# Patient Record
Sex: Male | Born: 1955
Health system: Southern US, Community
[De-identification: ages and names within clinical notes are randomized; demographics above are authoritative.]

## PROBLEM LIST (undated history)

## (undated) DIAGNOSIS — J449 Chronic obstructive pulmonary disease, unspecified: Secondary | ICD-10-CM

## (undated) DIAGNOSIS — I509 Heart failure, unspecified: Secondary | ICD-10-CM

## (undated) DIAGNOSIS — I25810 Atherosclerosis of coronary artery bypass graft(s) without angina pectoris: Secondary | ICD-10-CM

## (undated) DIAGNOSIS — E785 Hyperlipidemia, unspecified: Secondary | ICD-10-CM

## (undated) DIAGNOSIS — I10 Essential (primary) hypertension: Secondary | ICD-10-CM

## (undated) DIAGNOSIS — E119 Type 2 diabetes mellitus without complications: Secondary | ICD-10-CM

## (undated) DIAGNOSIS — K219 Gastro-esophageal reflux disease without esophagitis: Secondary | ICD-10-CM

## (undated) DIAGNOSIS — R569 Unspecified convulsions: Secondary | ICD-10-CM

## (undated) DIAGNOSIS — I499 Cardiac arrhythmia, unspecified: Secondary | ICD-10-CM

## (undated) DIAGNOSIS — I639 Cerebral infarction, unspecified: Secondary | ICD-10-CM

## (undated) DIAGNOSIS — Z951 Presence of aortocoronary bypass graft: Secondary | ICD-10-CM

## (undated) DIAGNOSIS — I2510 Atherosclerotic heart disease of native coronary artery without angina pectoris: Secondary | ICD-10-CM

## (undated) HISTORY — PX: EYE SURGERY: SHX253

## (undated) HISTORY — PX: CARDIAC SURGERY: SHX584

## (undated) HISTORY — DX: Heart failure, unspecified: I50.9

## (undated) HISTORY — PX: CORNEAL TRANSPLANT: SHX108

## (undated) HISTORY — PX: TRIGGER FINGER RELEASE: SHX641

## (undated) NOTE — *Deleted (*Deleted)
Transition of Care [facility]) - CM/SW Discharge Note   Patient Details  Name: Thomas Moore MRN: [id number] Date of Birth: 01/22/1939  Transition of Care [facility]) CM/SW Contact:  Nance Pear, RN Phone Number: 02/20/2020, 12:49 PM   Clinical Narrative:       Final next level of care: Home w Home Health Services Barriers to Discharge: No Barriers Identified   Patient Goals and CMS Choice Patient states their goals for this hospitalization and ongoing recovery are:: To return home      Discharge Placement                       Discharge Plan and Services In-house Referral: NA Discharge Planning Services: CM Consult Post Acute Care Choice: Home Health                    HH Arranged: RN, PT, OT [facility] Agency: [facility] Care Date [facility] Agency Contacted: 02/20/20 Time HH Agency Contacted: 1132 Representative spoke with at [facility] Agency: Kandee Keen  Social Determinants of Health (SDOH) Interventions     Readmission Risk Interventions Readmission Risk Prevention Plan 02/06/2020 12/29/2019 12/15/2019  Transportation Screening Complete Complete Complete  Medication Review [employer]) Complete Complete Complete  PCP or Specialist appointment within 3-5 days of discharge - Complete -  HRI or Home Care Consult Complete Complete Complete  SW Recovery Care/Counseling Consult Complete Complete Complete  Palliative Care Screening Not Applicable Complete Not Applicable  Skilled Nursing Facility Not Applicable Not Applicable Not Applicable  Some recent data might be hidden

---

## 1995-06-06 HISTORY — PX: CORONARY ARTERY BYPASS GRAFT: SHX141

## 2003-04-22 ENCOUNTER — Other Ambulatory Visit: Payer: Self-pay

## 2003-06-23 ENCOUNTER — Other Ambulatory Visit: Payer: Self-pay

## 2003-06-30 ENCOUNTER — Other Ambulatory Visit: Payer: Self-pay

## 2003-07-15 ENCOUNTER — Other Ambulatory Visit: Payer: Self-pay

## 2003-10-22 ENCOUNTER — Other Ambulatory Visit: Payer: Self-pay

## 2004-03-01 ENCOUNTER — Other Ambulatory Visit: Payer: Self-pay

## 2004-04-27 ENCOUNTER — Emergency Department: Payer: Self-pay | Admitting: Internal Medicine

## 2004-09-05 ENCOUNTER — Ambulatory Visit: Payer: Self-pay | Admitting: Specialist

## 2005-07-19 ENCOUNTER — Emergency Department: Payer: Self-pay | Admitting: Emergency Medicine

## 2005-09-01 ENCOUNTER — Ambulatory Visit: Payer: Self-pay | Admitting: Specialist

## 2007-09-10 ENCOUNTER — Ambulatory Visit: Payer: Self-pay | Admitting: Specialist

## 2009-09-22 ENCOUNTER — Ambulatory Visit: Payer: Self-pay | Admitting: Specialist

## 2010-09-21 ENCOUNTER — Ambulatory Visit: Payer: Self-pay | Admitting: Internal Medicine

## 2010-10-06 ENCOUNTER — Other Ambulatory Visit: Payer: Self-pay | Admitting: Unknown Physician Specialty

## 2010-10-08 ENCOUNTER — Emergency Department: Payer: Self-pay | Admitting: Emergency Medicine

## 2010-10-12 ENCOUNTER — Ambulatory Visit: Payer: Self-pay | Admitting: Specialist

## 2010-12-28 ENCOUNTER — Ambulatory Visit: Payer: Self-pay | Admitting: Internal Medicine

## 2011-01-04 ENCOUNTER — Ambulatory Visit: Payer: Self-pay | Admitting: Internal Medicine

## 2011-01-25 ENCOUNTER — Ambulatory Visit: Payer: Self-pay | Admitting: Unknown Physician Specialty

## 2011-02-04 ENCOUNTER — Ambulatory Visit: Payer: Self-pay | Admitting: Internal Medicine

## 2011-03-06 ENCOUNTER — Ambulatory Visit: Payer: Self-pay | Admitting: Internal Medicine

## 2011-08-25 DIAGNOSIS — Z7901 Long term (current) use of anticoagulants: Secondary | ICD-10-CM | POA: Insufficient documentation

## 2011-10-24 ENCOUNTER — Ambulatory Visit: Payer: Self-pay | Admitting: Internal Medicine

## 2011-11-20 ENCOUNTER — Ambulatory Visit: Payer: Self-pay | Admitting: Internal Medicine

## 2011-11-21 ENCOUNTER — Ambulatory Visit: Payer: Self-pay | Admitting: Internal Medicine

## 2012-01-23 ENCOUNTER — Ambulatory Visit: Payer: Self-pay | Admitting: Unknown Physician Specialty

## 2012-06-12 ENCOUNTER — Ambulatory Visit: Payer: Self-pay | Admitting: Internal Medicine

## 2012-12-16 ENCOUNTER — Ambulatory Visit: Payer: Self-pay | Admitting: Urology

## 2014-05-13 ENCOUNTER — Encounter: Payer: Self-pay | Admitting: Podiatry

## 2014-05-13 ENCOUNTER — Ambulatory Visit (INDEPENDENT_AMBULATORY_CARE_PROVIDER_SITE_OTHER): Payer: 59 | Admitting: Podiatry

## 2014-05-13 ENCOUNTER — Ambulatory Visit (INDEPENDENT_AMBULATORY_CARE_PROVIDER_SITE_OTHER): Payer: 59

## 2014-05-13 VITALS — BP 159/89 | HR 115 | Resp 16 | Ht 63.0 in | Wt 187.0 lb

## 2014-05-13 DIAGNOSIS — M722 Plantar fascial fibromatosis: Secondary | ICD-10-CM

## 2014-05-13 DIAGNOSIS — E119 Type 2 diabetes mellitus without complications: Secondary | ICD-10-CM

## 2014-05-13 NOTE — Progress Notes (Signed)
   Subjective:    Patient ID: Thomas Moore, male    DOB: [birth year], 58 y.o.   MRN: [id number]  HPI Comments: The right foot hurts on the outside, the bottom, and around my toes and my left foot hurts in the same places except for the outside of it. This has been going on for 4 - 5 months. Its getting worse. It hurts when i walk. i wear otc inserts.   Foot Pain      Review of Systems  HENT: Positive for hearing loss.   Cardiovascular:       Calf pain   Musculoskeletal:       Joint pain   Hematological: Bruises/bleeds easily.  All other systems reviewed and are negative.      Objective:   Physical Exam: I have reviewed his past medical history medications out of the surgery social history and review of systems. Pulses are strongly palpable. Neurologic sensorium is intact per Semmes-Weinstein monofilament. Deep tendon reflexes are intact bilateral and muscle strength +5 over 5 dorsiflexion and plantar flexors and inverters emerged on physical musculatures intact. Orthopedic evaluation demonstrates all joints distally to enable full range of motion without crepitus.  Palpation at the fourth fifth met cuboid articulation of the right foot as well as the plantar fascial calcaneal insertion site right. He also has pain on palpation medial calcaneal insertion site of the left heel.        Assessment & Plan:  Assessment: Plantar fasciitis with lateral compensatory syndrome right plantar fasciitis left.  Plan: Injected the dorsal lateral aspect of the right foot. In the left heel left foot. Also placed in a plantar fascial braces and a night splint. Discussed appropriate shoe gear shows exercises and ice therapy.

## 2014-06-10 ENCOUNTER — Ambulatory Visit: Payer: 59 | Admitting: Podiatry

## 2014-06-24 ENCOUNTER — Ambulatory Visit (INDEPENDENT_AMBULATORY_CARE_PROVIDER_SITE_OTHER): Payer: Commercial Managed Care - PPO

## 2014-06-24 ENCOUNTER — Ambulatory Visit (INDEPENDENT_AMBULATORY_CARE_PROVIDER_SITE_OTHER): Payer: Commercial Managed Care - PPO | Admitting: Podiatry

## 2014-06-24 VITALS — BP 122/102 | HR 78 | Resp 16

## 2014-06-24 DIAGNOSIS — M779 Enthesopathy, unspecified: Secondary | ICD-10-CM

## 2014-06-24 DIAGNOSIS — S92911A Unspecified fracture of right toe(s), initial encounter for closed fracture: Secondary | ICD-10-CM

## 2014-06-24 NOTE — Progress Notes (Signed)
He presents today for follow-up of bilateral plantar fasciitis. He states that the heel pain has nearly resolved however over a month ago he dropped a wooden shell on his fifth digit right foot. He states has been hurting horribly and limiting his [place] activities.  Objective: Vital signs are stable he is alert and oriented 3. Pulses are palpable bilateral. Minimal pain on palpation medial calcaneal tubercles bilateral. He does have erythema and edema with ecchymosis fifth digit of the right foot distally considerable swelling overlying the distal phalanx. Radiographic evaluation does demonstrate a crush injury to the distal phalanx with multiple comminution.  Assessment: Crush fracture distal phalanx fifth right. Resolving plantar fasciitis.  Plan: Demonstrated to him how to tape the toe daily. I encouraged him to wear loose fitting shoes and if the pain does not resolve in the next few months, secondary to the amount of from a distal amputation may be necessary.

## 2014-07-15 DIAGNOSIS — M1711 Unilateral primary osteoarthritis, right knee: Secondary | ICD-10-CM | POA: Insufficient documentation

## 2014-07-22 DIAGNOSIS — Z86711 Personal history of pulmonary embolism: Secondary | ICD-10-CM | POA: Insufficient documentation

## 2015-08-04 DIAGNOSIS — E7800 Pure hypercholesterolemia, unspecified: Secondary | ICD-10-CM | POA: Insufficient documentation

## 2016-09-21 ENCOUNTER — Other Ambulatory Visit: Payer: Self-pay | Admitting: Internal Medicine

## 2016-09-21 DIAGNOSIS — G8929 Other chronic pain: Secondary | ICD-10-CM

## 2016-09-21 DIAGNOSIS — M7581 Other shoulder lesions, right shoulder: Secondary | ICD-10-CM

## 2016-09-21 DIAGNOSIS — M25511 Pain in right shoulder: Secondary | ICD-10-CM

## 2016-10-04 ENCOUNTER — Ambulatory Visit: Payer: 59

## 2017-08-08 ENCOUNTER — Emergency Department: Payer: Managed Care, Other (non HMO)

## 2017-08-08 ENCOUNTER — Observation Stay: Payer: Managed Care, Other (non HMO)

## 2017-08-08 ENCOUNTER — Observation Stay
Admission: EM | Admit: 2017-08-08 | Discharge: 2017-08-09 | Disposition: A | Discharge status: Home / Self Care | Payer: Managed Care, Other (non HMO) | Attending: Internal Medicine | Admitting: Internal Medicine

## 2017-08-08 ENCOUNTER — Other Ambulatory Visit: Payer: Self-pay

## 2017-08-08 ENCOUNTER — Encounter: Payer: Self-pay | Admitting: Emergency Medicine

## 2017-08-08 DIAGNOSIS — Z87891 Personal history of nicotine dependence: Secondary | ICD-10-CM | POA: Insufficient documentation

## 2017-08-08 DIAGNOSIS — E785 Hyperlipidemia, unspecified: Secondary | ICD-10-CM | POA: Insufficient documentation

## 2017-08-08 DIAGNOSIS — I7389 Other specified peripheral vascular diseases: Secondary | ICD-10-CM | POA: Diagnosis not present

## 2017-08-08 DIAGNOSIS — Z7982 Long term (current) use of aspirin: Secondary | ICD-10-CM | POA: Diagnosis not present

## 2017-08-08 DIAGNOSIS — I2510 Atherosclerotic heart disease of native coronary artery without angina pectoris: Secondary | ICD-10-CM | POA: Insufficient documentation

## 2017-08-08 DIAGNOSIS — Z8673 Personal history of transient ischemic attack (TIA), and cerebral infarction without residual deficits: Secondary | ICD-10-CM | POA: Insufficient documentation

## 2017-08-08 DIAGNOSIS — E119 Type 2 diabetes mellitus without complications: Secondary | ICD-10-CM | POA: Insufficient documentation

## 2017-08-08 DIAGNOSIS — R410 Disorientation, unspecified: Secondary | ICD-10-CM | POA: Diagnosis present

## 2017-08-08 DIAGNOSIS — Z87442 Personal history of urinary calculi: Secondary | ICD-10-CM | POA: Diagnosis not present

## 2017-08-08 DIAGNOSIS — R4182 Altered mental status, unspecified: Secondary | ICD-10-CM | POA: Diagnosis present

## 2017-08-08 DIAGNOSIS — M25511 Pain in right shoulder: Secondary | ICD-10-CM | POA: Insufficient documentation

## 2017-08-08 DIAGNOSIS — Z86718 Personal history of other venous thrombosis and embolism: Secondary | ICD-10-CM | POA: Diagnosis not present

## 2017-08-08 DIAGNOSIS — G8929 Other chronic pain: Secondary | ICD-10-CM | POA: Diagnosis not present

## 2017-08-08 DIAGNOSIS — Z8249 Family history of ischemic heart disease and other diseases of the circulatory system: Secondary | ICD-10-CM | POA: Diagnosis not present

## 2017-08-08 DIAGNOSIS — G9340 Encephalopathy, unspecified: Principal | ICD-10-CM | POA: Insufficient documentation

## 2017-08-08 DIAGNOSIS — Z801 Family history of malignant neoplasm of trachea, bronchus and lung: Secondary | ICD-10-CM | POA: Insufficient documentation

## 2017-08-08 DIAGNOSIS — M1990 Unspecified osteoarthritis, unspecified site: Secondary | ICD-10-CM | POA: Diagnosis not present

## 2017-08-08 DIAGNOSIS — I6523 Occlusion and stenosis of bilateral carotid arteries: Secondary | ICD-10-CM | POA: Insufficient documentation

## 2017-08-08 DIAGNOSIS — Z951 Presence of aortocoronary bypass graft: Secondary | ICD-10-CM | POA: Insufficient documentation

## 2017-08-08 DIAGNOSIS — I10 Essential (primary) hypertension: Secondary | ICD-10-CM | POA: Diagnosis not present

## 2017-08-08 DIAGNOSIS — Z79899 Other long term (current) drug therapy: Secondary | ICD-10-CM | POA: Diagnosis not present

## 2017-08-08 DIAGNOSIS — Z7984 Long term (current) use of oral hypoglycemic drugs: Secondary | ICD-10-CM | POA: Insufficient documentation

## 2017-08-08 DIAGNOSIS — Z86711 Personal history of pulmonary embolism: Secondary | ICD-10-CM | POA: Insufficient documentation

## 2017-08-08 DIAGNOSIS — Z7901 Long term (current) use of anticoagulants: Secondary | ICD-10-CM | POA: Insufficient documentation

## 2017-08-08 DIAGNOSIS — Z947 Corneal transplant status: Secondary | ICD-10-CM | POA: Diagnosis not present

## 2017-08-08 HISTORY — DX: Essential (primary) hypertension: I10

## 2017-08-08 HISTORY — DX: Presence of aortocoronary bypass graft: Z95.1

## 2017-08-08 HISTORY — DX: Atherosclerotic heart disease of native coronary artery without angina pectoris: I25.10

## 2017-08-08 HISTORY — DX: Type 2 diabetes mellitus without complications: E11.9

## 2017-08-08 LAB — CBC
HCT: 39.9 % — ABNORMAL LOW (ref 40.0–52.0)
Hemoglobin: 13 g/dL (ref 13.0–18.0)
MCH: 28.1 pg (ref 26.0–34.0)
MCHC: 32.7 g/dL (ref 32.0–36.0)
MCV: 86.1 fL (ref 80.0–100.0)
PLATELETS: 193 10*3/uL (ref 150–440)
RBC: 4.63 MIL/uL (ref 4.40–5.90)
RDW: 15.3 % — AB (ref 11.5–14.5)
WBC: 4.8 10*3/uL (ref 3.8–10.6)

## 2017-08-08 LAB — COMPREHENSIVE METABOLIC PANEL
ALT: 52 U/L (ref 17–63)
AST: 56 U/L — AB (ref 15–41)
Albumin: 3.8 g/dL (ref 3.5–5.0)
Alkaline Phosphatase: 88 U/L (ref 38–126)
Anion gap: 16 — ABNORMAL HIGH (ref 5–15)
BUN: 21 mg/dL — AB (ref 6–20)
CHLORIDE: 103 mmol/L (ref 101–111)
CO2: 19 mmol/L — AB (ref 22–32)
CREATININE: 0.93 mg/dL (ref 0.61–1.24)
Calcium: 8.9 mg/dL (ref 8.9–10.3)
Glucose, Bld: 164 mg/dL — ABNORMAL HIGH (ref 65–99)
POTASSIUM: 4.1 mmol/L (ref 3.5–5.1)
SODIUM: 138 mmol/L (ref 135–145)
Total Bilirubin: 0.5 mg/dL (ref 0.3–1.2)
Total Protein: 7.8 g/dL (ref 6.5–8.1)

## 2017-08-08 LAB — URINALYSIS, COMPLETE (UACMP) WITH MICROSCOPIC
Bacteria, UA: NONE SEEN
Bilirubin Urine: NEGATIVE
GLUCOSE, UA: NEGATIVE mg/dL
Hgb urine dipstick: NEGATIVE
Ketones, ur: NEGATIVE mg/dL
Leukocytes, UA: NEGATIVE
NITRITE: NEGATIVE
PH: 5 (ref 5.0–8.0)
Protein, ur: 100 mg/dL — AB
SPECIFIC GRAVITY, URINE: 1.014 (ref 1.005–1.030)
Squamous Epithelial / LPF: NONE SEEN

## 2017-08-08 LAB — GLUCOSE, CAPILLARY
Glucose-Capillary: 134 mg/dL — ABNORMAL HIGH (ref 65–99)
Glucose-Capillary: 111 mg/dL — ABNORMAL HIGH (ref 65–99)

## 2017-08-08 LAB — URINE DRUG SCREEN, QUALITATIVE (ARMC ONLY)
AMPHETAMINES, UR SCREEN: NOT DETECTED
BENZODIAZEPINE, UR SCRN: NOT DETECTED
Barbiturates, Ur Screen: NOT DETECTED
COCAINE METABOLITE, UR ~~LOC~~: NOT DETECTED
Cannabinoid 50 Ng, Ur ~~LOC~~: NOT DETECTED
MDMA (ECSTASY) UR SCREEN: NOT DETECTED
METHADONE SCREEN, URINE: NOT DETECTED
OPIATE, UR SCREEN: NOT DETECTED
PHENCYCLIDINE (PCP) UR S: NOT DETECTED
Tricyclic, Ur Screen: NOT DETECTED

## 2017-08-08 LAB — DIFFERENTIAL
BASOS ABS: 0 10*3/uL (ref 0–0.1)
BASOS PCT: 1 %
Eosinophils Absolute: 0.1 10*3/uL (ref 0–0.7)
Eosinophils Relative: 2 %
Lymphocytes Relative: 23 %
Lymphs Abs: 1.1 10*3/uL (ref 1.0–3.6)
MONOS PCT: 8 %
Monocytes Absolute: 0.4 10*3/uL (ref 0.2–1.0)
NEUTROS ABS: 3.2 10*3/uL (ref 1.4–6.5)
Neutrophils Relative %: 66 %

## 2017-08-08 LAB — PROTIME-INR
INR: 3.4
PROTHROMBIN TIME: 34.1 s — AB (ref 11.4–15.2)

## 2017-08-08 LAB — SALICYLATE LEVEL

## 2017-08-08 LAB — APTT: APTT: 47 s — AB (ref 24–36)

## 2017-08-08 LAB — ETHANOL: Alcohol, Ethyl (B): <10 mg/dL (ref <10)

## 2017-08-08 LAB — ACETAMINOPHEN LEVEL

## 2017-08-08 LAB — TROPONIN I

## 2017-08-08 MED ORDER — ASPIRIN 81 MG PO CHEW
324.0000 mg | CHEWABLE_TABLET | Freq: Once | ORAL | Status: AC
Start: 2017-08-08 — End: 2017-08-08
  Administered 2017-08-08: 324 mg via ORAL
  Filled 2017-08-08: qty 4

## 2017-08-08 MED ORDER — ATORVASTATIN CALCIUM 20 MG PO TABS
40.0000 mg | ORAL_TABLET | Freq: Every day | ORAL | Status: DC
  Administered 2017-08-08: 18:00:00 40 mg via ORAL
  Filled 2017-08-08: qty 2

## 2017-08-08 MED ORDER — METFORMIN HCL ER 500 MG PO TB24
1000.0000 mg | ORAL_TABLET | Freq: Two times a day (BID) | ORAL | Status: DC
  Administered 2017-08-08 – 2017-08-09 (×2): 1000 mg via ORAL
  Filled 2017-08-08 (×3): qty 2

## 2017-08-08 MED ORDER — METOPROLOL SUCCINATE ER 50 MG PO TB24
50.0000 mg | ORAL_TABLET | Freq: Every day | ORAL | Status: DC
  Administered 2017-08-08 – 2017-08-09 (×2): 50 mg via ORAL
  Filled 2017-08-08 (×2): qty 1

## 2017-08-08 MED ORDER — FUROSEMIDE 40 MG PO TABS
80.0000 mg | ORAL_TABLET | Freq: Every day | ORAL | Status: DC
  Administered 2017-08-08 – 2017-08-09 (×2): 80 mg via ORAL
  Filled 2017-08-08 (×2): qty 2

## 2017-08-08 MED ORDER — ASPIRIN EC 81 MG PO TBEC
81.0000 mg | DELAYED_RELEASE_TABLET | Freq: Every day | ORAL | Status: DC
  Administered 2017-08-09: 09:00:00 81 mg via ORAL
  Filled 2017-08-08: qty 1

## 2017-08-08 MED ORDER — STROKE: EARLY STAGES OF RECOVERY BOOK
Freq: Once | Status: AC
  Administered 2017-08-08: 14:00:00

## 2017-08-08 MED ORDER — ACETAMINOPHEN 325 MG PO TABS: 650.0000 mg | ORAL_TABLET | ORAL | Status: DC | PRN

## 2017-08-08 MED ORDER — RAMIPRIL 10 MG PO CAPS
10.0000 mg | ORAL_CAPSULE | Freq: Every day | ORAL | Status: DC
  Administered 2017-08-08 – 2017-08-09 (×2): 10 mg via ORAL
  Filled 2017-08-08 (×2): qty 1

## 2017-08-08 MED ORDER — ENOXAPARIN SODIUM 40 MG/0.4ML ~~LOC~~ SOLN: 30.0000 mg | SUBCUTANEOUS | Status: DC

## 2017-08-08 MED ORDER — ACETAMINOPHEN 160 MG/5ML PO SOLN
650.0000 mg | ORAL | Status: DC | PRN
  Filled 2017-08-08: qty 20.3

## 2017-08-08 MED ORDER — SODIUM CHLORIDE 0.9 % IV SOLN
INTRAVENOUS | Status: DC
  Administered 2017-08-08: 18:00:00 via INTRAVENOUS

## 2017-08-08 MED ORDER — DULAGLUTIDE 0.75 MG/0.5ML ~~LOC~~ SOAJ: 0.7500 mg | SUBCUTANEOUS | Status: DC

## 2017-08-08 MED ORDER — ACETAMINOPHEN 650 MG RE SUPP: 650.0000 mg | RECTAL | Status: DC | PRN

## 2017-08-08 MED ORDER — SODIUM CHLORIDE 0.9 % IV BOLUS (SEPSIS)
1000.0000 mL | Freq: Once | INTRAVENOUS | Status: AC
  Administered 2017-08-08: 1000 mL via INTRAVENOUS

## 2017-08-08 MED ORDER — LINAGLIPTIN 5 MG PO TABS
5.0000 mg | ORAL_TABLET | Freq: Every day | ORAL | Status: DC
  Administered 2017-08-09: 09:00:00 5 mg via ORAL
  Filled 2017-08-08: qty 1

## 2017-08-08 MED ORDER — GLIPIZIDE ER 2.5 MG PO TB24
2.5000 mg | ORAL_TABLET | Freq: Every day | ORAL | Status: DC
  Administered 2017-08-09: 2.5 mg via ORAL
  Filled 2017-08-08: qty 1

## 2017-08-08 MED ORDER — INSULIN ASPART 100 UNIT/ML ~~LOC~~ SOLN
0.0000 [IU] | Freq: Three times a day (TID) | SUBCUTANEOUS | Status: DC
  Administered 2017-08-09: 1 [IU] via SUBCUTANEOUS
  Administered 2017-08-09: 12:00:00 2 [IU] via SUBCUTANEOUS
  Filled 2017-08-08 (×2): qty 1

## 2017-08-08 NOTE — Code Documentation (Signed)
Pt arrives via EMS, pt was eating at a restaurant when per staff pt looked like he was choking, fell to the ground and then became agitated and confused, upon arrival to ED pt appears drowsy and confused, code stroke initiated, pt taken to CT for non-con head CT and then back to room 26, NIHSS 2, small laceration noted to left side of pts tongue with some blood on his mouth, per MAR in the computer pt on coumadin with hx of PE, no acute treatment, report off to [employer]

## 2017-08-08 NOTE — ED Notes (Signed)
Family at bedside. 

## 2017-08-08 NOTE — ED Notes (Signed)
[facility] transporting pt to floor room 128-1C

## 2017-08-08 NOTE — ED Notes (Signed)
COD STROKE Initiated

## 2017-08-08 NOTE — ED Provider Notes (Signed)
[facility] Emergency Department Provider Note  ____________________________________________  Time seen: Approximately 12:08 PM  I have reviewed the triage vital signs and the nursing notes.   HISTORY  Chief Complaint Code Stroke  Level 5 Caveat: Portions of the History and Physical were unable to be obtained due to altered mental status.   HPI Thomas Moore is a 62 y.o. male comes to the ED with acute altered mental status. Patient drove himself to a restaurant where he is seen frequently, seemed to choke on coffee, and then appeared to be completely confused. No convulsive activity noted. No fall or head trauma. No history of any similar episodes. Patient is unable to provide any coherent history. Electronic medical records shows the patient takes Coumadin.     No past medical history on file. Hyperlipidemia CAD Diabetes   There are no active problems to display for this patient.       Prior to Admission medications   Medication Sig Start Date End Date Taking? Authorizing Provider  aspirin EC 81 MG tablet Take 81 mg by mouth.    [provider]  atorvastatin (LIPITOR) 40 MG tablet Take 40 mg by mouth.    [provider]  furosemide (LASIX) 80 MG tablet Take 80 mg by mouth.    [provider]  gabapentin (NEURONTIN) 300 MG capsule Take 600 mg by mouth.    [provider]  glipiZIDE (GLUCOTROL XL) 2.5 MG 24 hr tablet Take 2.5 mg by mouth.    [provider]  metFORMIN (GLUCOPHAGE-XR) 500 MG 24 hr tablet Take 2 tablets by mouth 2 (two) times daily. 06/06/17   [provider]  metoprolol succinate (TOPROL-XL) 50 MG 24 hr tablet Take 1 tablet by mouth daily. 06/06/17   [provider]  Multiple Vitamins tablet Take by mouth.    [provider]  omeprazole (PRILOSEC) 40 MG capsule Take 40 mg by mouth.    [provider]  ramipril (ALTACE) 10 MG capsule Take 10 mg by mouth.     [provider]  sitaGLIPtin (JANUVIA) 50 MG tablet Take 1 tablet by mouth  daily 01/30/14   [provider]  TRULICITY 0.75 MG/0.5ML SOPN Inject 0.75 mg into the skin every 7 (seven) days. 07/27/17   [provider]  warfarin (COUMADIN) 1 MG tablet  07/31/17   [provider]  warfarin (COUMADIN) 6 MG tablet  07/11/17   [provider]     Allergies Patient has no known allergies.   No family history on file.  Social History Social History   Tobacco Use  . Smoking status: Former Smoker    Types: Cigarettes  . Smokeless tobacco: Never Used  Substance Use Topics  . Alcohol use: No    Alcohol/week: 0.0 oz  . Drug use: Not on file    Review of Systems Unable to obtain due to altered mental status ____________________________________________   PHYSICAL EXAM:  VITAL SIGNS: ED Triage Vitals [08/08/17 1028]  Enc Vitals Group     BP 136/83     Pulse Rate (!) 121     Resp 19     Temp 97.6 F (36.4 C)     Temp Source Oral     SpO2 97 %     Weight 180 lb (81.6 kg)     Height 5\' 6"  (1.676 m)     Head Circumference      Peak Flow      Pain Score  Pain Loc      Pain Edu?      Excl. in GC?     Vital signs reviewed, nursing assessments reviewed.   Constitutional:   Alert and oriented to self. Not in distress. Eyes:   No scleral icterus.  EOMI. No nystagmus. No conjunctival pallor. PERRL. ENT   Head:   Normocephalic and atraumatic.   Nose:   No congestion/rhinnorhea.    Mouth/Throat:   MMM, no pharyngeal erythema. No peritonsillar mass.    Neck:   No meningismus. Full ROM. Hematological/Lymphatic/Immunilogical:   No cervical lymphadenopathy. Cardiovascular:   Tachycardia heart rate 120. Symmetric bilateral radial and DP pulses.  No murmurs. Median sternotomy scar, remote and well-healed Respiratory:   Normal respiratory effort without tachypnea/retractions. Breath sounds are clear and equal bilaterally. No  wheezes/rales/rhonchi. Gastrointestinal:   Soft and nontender. Non distended. There is no CVA tenderness.  No rebound, rigidity, or guarding. Genitourinary:   deferred Musculoskeletal:   Normal range of motion in all extremities. No joint effusions.  No lower extremity tenderness.  No edema. Neurologic:   Normal speech, nonsensical language responses..  Motor grossly intact. NIH stroke scale 1 for confusion.  Skin:    Skin is warm, dry and intact. No rash noted.  No petechiae, purpura, or bullae.  ____________________________________________    LABS (pertinent positives/negatives) (all labs ordered are listed, but only abnormal results are displayed) Labs Reviewed  PROTIME-INR - Abnormal; Notable for the following components:      Result Value   Prothrombin Time 34.1 (*)    All other components within normal limits  APTT - Abnormal; Notable for the following components:   aPTT 47 (*)    All other components within normal limits  CBC - Abnormal; Notable for the following components:   HCT 39.9 (*)    RDW 15.3 (*)    All other components within normal limits  COMPREHENSIVE METABOLIC PANEL - Abnormal; Notable for the following components:   CO2 19 (*)    Glucose, Bld 164 (*)    BUN 21 (*)    AST 56 (*)    Anion gap 16 (*)    All other components within normal limits  ACETAMINOPHEN LEVEL - Abnormal; Notable for the following components:   Acetaminophen (Tylenol), Serum <10 (*)    All other components within normal limits  GLUCOSE, CAPILLARY - Abnormal; Notable for the following components:   Glucose-Capillary 134 (*)    All other components within normal limits  DIFFERENTIAL  TROPONIN I  ETHANOL  SALICYLATE LEVEL  URINALYSIS, COMPLETE (UACMP) WITH MICROSCOPIC  URINE DRUG SCREEN, QUALITATIVE ([facility] ONLY)  CBG MONITORING, ED   ____________________________________________   EKG  Interpreted by me Sinus tachycardia rate 110, normal axis and intervals. Normal QRS ST  segments and T waves.  ____________________________________________    RADIOLOGY  Dg Chest Portable 1 View  Result Date: 08/08/2017 CLINICAL DATA:  Chest pain, shortness of breath. EXAM: PORTABLE CHEST 1 VIEW COMPARISON:  None available currently. FINDINGS: Mild cardiomegaly is noted. Status post coronary artery bypass graft. No pneumothorax or pleural effusion is noted. No acute pulmonary disease is noted. Bony thorax is unremarkable. IMPRESSION: No acute cardiopulmonary abnormality seen. Electronically Signed   By: Lupita Raider, M.D.   On: 08/08/2017 10:44   Ct Head Code Stroke Wo Contrast  Result Date: 08/08/2017 CLINICAL DATA:  Code stroke. Altered level of consciousness, unexplained. EXAM: CT HEAD WITHOUT CONTRAST TECHNIQUE: Contiguous axial images were obtained from the base of the  skull through the vertex without intravenous contrast. COMPARISON:  None available FINDINGS: Brain: No evidence of acute infarction, hemorrhage, hydrocephalus, extra-axial collection or mass lesion/mass effect. Moderate remote left frontal infarct anteriorly. This is in the ACA distribution. Chronic small vessel ischemic type change in the cerebral white matter. Vascular: Atherosclerotic calcification.  No hyperdense vessel. Skull: Normal. Negative for fracture or focal lesion. Sinuses/Orbits: No acute finding. Other: These results were called by telephone at the time of interpretation on 08/08/2017 at 10:23 am to Dr. Sharman Cheek , who verbally acknowledged these results. ASPECTS [facility] Stroke Program Early CT Score) Not scored with this symptomatology. IMPRESSION: 1. No acute finding. 2. Remote left frontal infarct in the ACA distribution. Electronically Signed   By: Marnee Spring M.D.   On: 08/08/2017 10:24    ____________________________________________   PROCEDURES Procedures  ____________________________________________    CLINICAL IMPRESSION / ASSESSMENT AND PLAN / ED COURSE  Pertinent labs  & imaging results that were available during my care of the patient were reviewed by me and considered in my medical decision making (see chart for details).   Patient presents with acute altered mental status. Possible stroke versus seizure versus intoxication. Not consistent with sepsis. Check labs CT had chest x-ray.  Clinical Course as of Aug 08 1245  Wed Aug 08, 2017  1023 D/w radiology, NAD on CT head.   [PS]  1040 D/w neuro Dr. Thad Ranger. Recommends hospitalization for further stroke/seizure workup. Not a candidate for TPA or acute intervention at this time.   [PS]    Clinical Course User Index [PS] Sharman Cheek, MD     ____________________________________________   FINAL CLINICAL IMPRESSION(S) / ED DIAGNOSES    Final diagnoses:  Acute encephalopathy     ED Discharge Orders    None      Portions of this note were generated with dragon dictation software. Dictation errors may occur despite best attempts at proofreading.    Sharman Cheek, MD 08/08/17 1247

## 2017-08-08 NOTE — Consult Note (Signed)
Referring Physician: Scotty Court    Chief Complaint: Syncope, confusion  HPI: Thomas Moore is an 62 y.o. male who by reports awakened this morning at baseline and drove to breakfast.  While attempting to eat was noticed to possibly be choking.  Patient was then confused and somewhat agitated.  Unclear if he hit the ground.  EMS was called and patient was brought in for evaluation.     Date last known well: Date: 08/08/2017 Time last known well: Time: 09:00 tPA Given: No: Resolving symptoms  Past medical history: HTN, DM, HLD, PE, Osteoarthritis, HH, congenital coronary artery anomaly, chronic right shoulder pain, h/o kidney stone  Family history: Father deceased with lung cancer.  Mother deceased with CAD and Alzheimer's disease.  Sister deceased with brain tumor  Social History:  reports that he has quit smoking. His smoking use included cigarettes. he has never used smokeless tobacco. He reports that he does not drink alcohol. His drug history is not on file.  Allergies: No Known Allergies  Medications: I have reviewed the patient's current medications. Prior to Admission:  Prior to Admission medications   Medication Sig Start Date End Date Taking? Authorizing Provider  aspirin EC 81 MG tablet Take 81 mg by mouth.    [provider]  atorvastatin (LIPITOR) 40 MG tablet Take 40 mg by mouth.    [provider]  furosemide (LASIX) 80 MG tablet Take 80 mg by mouth.    [provider]  gabapentin (NEURONTIN) 300 MG capsule Take 600 mg by mouth.    [provider]  glipiZIDE (GLUCOTROL XL) 2.5 MG 24 hr tablet Take 2.5 mg by mouth.    [provider]  metFORMIN (GLUCOPHAGE-XR) 500 MG 24 hr tablet Take 2 tablets by mouth 2 (two) times daily. 06/06/17   [provider]  metoprolol succinate (TOPROL-XL) 50 MG 24 hr tablet Take 1 tablet by mouth daily. 06/06/17   [provider]  Multiple Vitamins tablet Take by mouth.    [provider]  omeprazole (PRILOSEC) 40 MG capsule Take 40 mg by mouth.    [provider]  ramipril (ALTACE) 10 MG capsule Take 10 mg by mouth.    [provider]  sitaGLIPtin (JANUVIA) 50 MG tablet Take 1 tablet by mouth  daily 01/30/14   [provider]  TRULICITY 0.75 MG/0.5ML SOPN Inject 0.75 mg into the skin every 7 (seven) days. 07/27/17   [provider]  warfarin (COUMADIN) 1 MG tablet  07/31/17   [provider]  warfarin (COUMADIN) 6 MG tablet  07/11/17   [provider]     ROS: History obtained from the patient  General ROS: negative for - chills, fatigue, fever, night sweats, weight gain or weight loss Psychological ROS: negative for - behavioral disorder, hallucinations, memory difficulties, mood swings or suicidal ideation Ophthalmic ROS: negative for - blurry vision, double vision, eye pain or loss of vision ENT ROS: negative for - epistaxis, nasal discharge, oral lesions, sore throat, tinnitus or vertigo Allergy and Immunology ROS: negative for - hives or itchy/watery eyes Hematological and Lymphatic ROS: negative for - bleeding problems, bruising or swollen lymph nodes Endocrine ROS: negative for - galactorrhea, hair pattern changes, polydipsia/polyuria or temperature intolerance Respiratory ROS: negative for - cough, hemoptysis, shortness of breath or wheezing Cardiovascular ROS: negative for - chest pain, dyspnea on exertion, edema or irregular heartbeat Gastrointestinal ROS: negative for - abdominal pain, diarrhea, hematemesis, nausea/vomiting or stool incontinence Genito-Urinary ROS: negative for -  dysuria, hematuria, incontinence or urinary frequency/urgency Musculoskeletal ROS: shoulder pain Neurological ROS: as noted in HPI Dermatological ROS: negative for rash and skin lesion changes  Physical Examination: Blood pressure (!) 150/90, pulse (!) 103, temperature 98 F (36.7 C), resp. rate (!) 27, height 5\' 6"   (1.676 m), weight 81.6 kg (180 lb), SpO2 95 %.  HEENT-  Normocephalic, no lesions, without obvious abnormality.  Normal external eye and conjunctiva.  Normal TM's bilaterally.  Normal auditory canals and external ears. Normal external nose, mucus membranes and septum.  Normal pharynx.  Tongue laceration. Cardiovascular- S1, S2 normal, pulses palpable throughout   Lungs- chest clear, no wheezing, rales, normal symmetric air entry Abdomen- soft, non-tender; bowel sounds normal; no masses,  no organomegaly Extremities- no edema Lymph-no adenopathy palpable Musculoskeletal-no joint tenderness, deformity or swelling Skin-warm and dry, no hyperpigmentation, vitiligo, or suspicious lesions  Neurological Examination   Mental Status: Alert, to name and place but unable to give age and amnestic of events of the morning.  Speech fluent without evidence of aphasia.  Able to follow 3 step commands but requires extensive reinforcement. Cranial Nerves: II: Discs flat bilaterally; Visual fields grossly normal, pupils equal, round, reactive to light and accommodation III,IV, VI: ptosis not present, extra-ocular motions intact bilaterally V,VII: smile symmetric, facial light touch sensation normal bilaterally VIII: hearing normal bilaterally IX,X: gag reflex present XI: bilateral shoulder shrug XII: midline tongue extension Motor: Right : Upper extremity   5/5    Left:     Upper extremity   5/5  Lower extremity   5/5     Lower extremity   5/5 Tone and bulk:normal tone throughout; no atrophy noted Sensory: Pinprick and light touch intact throughout, bilaterally Deep Tendon Reflexes: 2+ and symmetric throughout Plantars: Right: upgoing   Left: upgoing Cerebellar: Normal finger-to-nose and normal heel-to-shin testing bilaterally Gait: not tested due to safety concerns   Laboratory Studies:  Basic Metabolic Panel: Recent Labs  Lab 08/08/17 1032  NA 138  K 4.1  CL 103  CO2 19*  GLUCOSE 164*   BUN 21*  CREATININE 0.93  CALCIUM 8.9    Liver Function Tests: Recent Labs  Lab 08/08/17 1032  AST 56*  ALT 52  ALKPHOS 88  BILITOT 0.5  PROT 7.8  ALBUMIN 3.8   No results for input(s): LIPASE, AMYLASE in the last 168 hours. No results for input(s): AMMONIA in the last 168 hours.  CBC: Recent Labs  Lab 08/08/17 1032  WBC 4.8  NEUTROABS 3.2  HGB 13.0  HCT 39.9*  MCV 86.1  PLT 193    Cardiac Enzymes: Recent Labs  Lab 08/08/17 1032  TROPONINI <0.03    BNP: Invalid input(s): POCBNP  CBG: Recent Labs  Lab 08/08/17 1021  GLUCAP 134*    Microbiology: No results found for this or any previous visit.  Coagulation Studies: Recent Labs    08/08/17 1032  LABPROT 34.1*  INR 3.40    Urinalysis:  Recent Labs  Lab 08/08/17 1019  COLORURINE YELLOW*  LABSPEC 1.014  PHURINE 5.0  GLUCOSEU NEGATIVE  HGBUR NEGATIVE  BILIRUBINUR NEGATIVE  KETONESUR NEGATIVE  PROTEINUR 100*  NITRITE NEGATIVE  LEUKOCYTESUR NEGATIVE    Lipid Panel: No results found for: CHOL, TRIG, HDL, CHOLHDL, VLDL, LDLCALC  HgbA1C: No results found for: HGBA1C  Urine Drug Screen:      Component Value Date/Time   LABOPIA NONE DETECTED 08/08/2017 1019   COCAINSCRNUR NONE DETECTED 08/08/2017 1019   LABBENZ NONE DETECTED 08/08/2017 1019  AMPHETMU NONE DETECTED 08/08/2017 1019   THCU NONE DETECTED 08/08/2017 1019   LABBARB NONE DETECTED 08/08/2017 1019    Alcohol Level:  Recent Labs  Lab 08/08/17 1032  ETH <10    Other results: EKG: sinus rhythm.  Imaging: Dg Chest Portable 1 View  Result Date: 08/08/2017 CLINICAL DATA:  Chest pain, shortness of breath. EXAM: PORTABLE CHEST 1 VIEW COMPARISON:  None available currently. FINDINGS: Mild cardiomegaly is noted. Status post coronary artery bypass graft. No pneumothorax or pleural effusion is noted. No acute pulmonary disease is noted. Bony thorax is unremarkable. IMPRESSION: No acute cardiopulmonary abnormality seen.  Electronically Signed   By: Lupita Raider, M.D.   On: 08/08/2017 10:44   Ct Head Code Stroke Wo Contrast  Result Date: 08/08/2017 CLINICAL DATA:  Code stroke. Altered level of consciousness, unexplained. EXAM: CT HEAD WITHOUT CONTRAST TECHNIQUE: Contiguous axial images were obtained from the base of the skull through the vertex without intravenous contrast. COMPARISON:  None available FINDINGS: Brain: No evidence of acute infarction, hemorrhage, hydrocephalus, extra-axial collection or mass lesion/mass effect. Moderate remote left frontal infarct anteriorly. This is in the ACA distribution. Chronic small vessel ischemic type change in the cerebral white matter. Vascular: Atherosclerotic calcification.  No hyperdense vessel. Skull: Normal. Negative for fracture or focal lesion. Sinuses/Orbits: No acute finding. Other: These results were called by telephone at the time of interpretation on 08/08/2017 at 10:23 am to Dr. Sharman Cheek , who verbally acknowledged these results. ASPECTS [facility] Stroke Program Early CT Score) Not scored with this symptomatology. IMPRESSION: 1. No acute finding. 2. Remote left frontal infarct in the ACA distribution. Electronically Signed   By: Marnee Spring M.D.   On: 08/08/2017 10:24    Assessment: 62 y.o. male presenting with an episode of confusion that is slowly resolving.  No focality noted on neurological examination.  Head CT reviewed and shows a chronic left frontal infarct.  Patient on ASA and Coumadin.  Unclear if this presentation represents a seizure.  Further work up recommended.    Stroke Risk Factors - diabetes mellitus, hyperlipidemia and hypertension  Plan: 1. HgbA1c, fasting lipid panel 2. MRI, MRA  of the brain without contrast 3. EEG 4. Telemetry monitoring 5. Frequent neuro checks 6. Continue ASA and Coumadin 7. Carotid doppler 8. Seizure precautions   Thana Farr, MD Neurology [phone] 08/08/2017, 12:32 PM

## 2017-08-08 NOTE — H&P (Signed)
[facility] -  at [facility]   PATIENT NAME: Thomas Moore    MR#:  [id number]  DATE OF BIRTH:  April 14, 1956  DATE OF ADMISSION:  08/08/2017  PRIMARY CARE PHYSICIAN: Leotis Shames, MD   REQUESTING/REFERRING PHYSICIAN: Dr. Scotty Court  CHIEF COMPLAINT: Altered mental status   Chief Complaint  Patient presents with  . Code Stroke    HISTORY OF PRESENT ILLNESS:  Thomas Moore  is a 62 y.o. male with a known history of hypertension, diabetes mellitus type 2, CAD, hyperlipidemia, chronic right shoulder pain noted to have started on confusion, choking episode, froth coming out of his mouth.  Patient alert now and he says he went to [facility] this morning after that he went home unchanged his dressing went to restaurant that usually goes.  Patient found to have choking episode, confusion, according to his friend who is at bedside now, was also at [employer].  He told me that he noted to have blood coming out of mouth, found on the floor.  Patient does not remember any of the events and he says he does not even remember going to [employer].  He appears alert, awake, oriented.  Denies any weakness of hands or legs.  Speech is clear.  Has chronic   Right shoulder pain. PAST MEDICAL HISTORY:  No past medical history on file.  PAST SURGICAL HISTOIRY:   CAD, CABG. Corneal transplant in the right eye. History of left knee surgery SOCIAL HISTORY:   Social History   Tobacco Use  . Smoking status: Former Smoker    Types: Cigarettes  . Smokeless tobacco: Never Used  Substance Use Topics  . Alcohol use: No    Alcohol/week: 0.0 oz    FAMILY HISTORY:  Father had lung cancer, mother had Alzheimer's dementia, sister and brother had strokes.  DRUG ALLERGIES:  No Known Allergies  REVIEW OF SYSTEMS:  CONSTITUTIONAL: No fever, fatigue or weakness.  EYES: No blurred or double vision.  EARS, NOSE, AND THROAT: No tinnitus or ear pain.  RESPIRATORY: No cough, shortness  of breath, wheezing or hemoptysis.  CARDIOVASCULAR: No chest pain, orthopnea, edema.  GASTROINTESTINAL: No nausea, vomiting, diarrhea or abdominal pain.  GENITOURINARY: No dysuria, hematuria.  ENDOCRINE: No polyuria, nocturia,  HEMATOLOGY: No anemia, easy bruising or bleeding SKIN: No rash or lesion. MUSCULOSKELETAL: No joint pain or arthritis.   NEUROLOGIC: No tingling, numbness, weakness.  PSYCHIATRY: No anxiety or depression.   MEDICATIONS AT HOME:   Prior to Admission medications   Medication Sig Start Date End Date Taking? Authorizing Provider  aspirin EC 81 MG tablet Take 81 mg by mouth.    [provider]  atorvastatin (LIPITOR) 40 MG tablet Take 40 mg by mouth.    [provider]  furosemide (LASIX) 80 MG tablet Take 80 mg by mouth.    [provider]  gabapentin (NEURONTIN) 300 MG capsule Take 600 mg by mouth.    [provider]  glipiZIDE (GLUCOTROL XL) 2.5 MG 24 hr tablet Take 2.5 mg by mouth.    [provider]  metFORMIN (GLUCOPHAGE-XR) 500 MG 24 hr tablet Take 2 tablets by mouth 2 (two) times daily. 06/06/17   [provider]  metoprolol succinate (TOPROL-XL) 50 MG 24 hr tablet Take 1 tablet by mouth daily. 06/06/17   [provider]  Multiple Vitamins tablet Take by mouth.    [provider]  omeprazole (PRILOSEC) 40 MG capsule Take 40 mg by mouth.    [provider]  ramipril (ALTACE) 10 MG capsule Take 10 mg by mouth.    [provider]  sitaGLIPtin (JANUVIA) 50 MG tablet Take 1 tablet by mouth  daily 01/30/14   [provider]  TRULICITY 0.75 MG/0.5ML SOPN Inject 0.75 mg into the skin every 7 (seven) days. 07/27/17   [provider]  warfarin (COUMADIN) 1 MG tablet  07/31/17   [provider]  warfarin (COUMADIN) 6 MG tablet  07/11/17   [provider]      VITAL SIGNS:  Blood pressure (!) 141/82, pulse 99, temperature 98 F (36.7 C), resp. rate 20,  height 5\' 6"  (1.676 m), weight 81.6 kg (180 lb), SpO2 95 %.  PHYSICAL EXAMINATION:  GENERAL:  62 y.o.-year-old patient lying in the bed with no acute distress.  EYES: Pupils equal, round, reactive to light and accommodation. No scleral icterus. Extraocular muscles intact.  HEENT: Head atraumatic, normocephalic. Oropharynx and nasopharynx clear.  NECK:  Supple, no jugular venous distention. No thyroid enlargement, no tenderness.  LUNGS: Normal breath sounds bilaterally, no wheezing, rales,rhonchi or crepitation. No use of accessory muscles of respiration.  CARDIOVASCULAR: S1, S2 normal. No murmurs, rubs, or gallops.  ABDOMEN: Soft, nontender, nondistended. Bowel sounds present. No organomegaly or mass.  EXTREMITIES: No pedal edema, cyanosis, or clubbing.  NEUROLOGIC: Cranial nerves II through XII are intact. Muscle strength 5/5 in all extremities. Sensation intact. Gait not checked.  PSYCHIATRIC: The patient is alert and oriented x 3.  SKIN: No obvious rash, lesion, or ulcer.   LABORATORY PANEL:   CBC Recent Labs  Lab 08/08/17 1032  WBC 4.8  HGB 13.0  HCT 39.9*  PLT 193   ------------------------------------------------------------------------------------------------------------------  Chemistries  Recent Labs  Lab 08/08/17 1032  NA 138  K 4.1  CL 103  CO2 19*  GLUCOSE 164*  BUN 21*  CREATININE 0.93  CALCIUM 8.9  AST 56*  ALT 52  ALKPHOS 88  BILITOT 0.5   ------------------------------------------------------------------------------------------------------------------  Cardiac Enzymes Recent Labs  Lab 08/08/17 1032  TROPONINI <0.03   ------------------------------------------------------------------------------------------------------------------  RADIOLOGY:  Dg Chest Portable 1 View  Result Date: 08/08/2017 CLINICAL DATA:  Chest pain, shortness of breath. EXAM: PORTABLE CHEST 1 VIEW COMPARISON:  None available currently. FINDINGS: Mild cardiomegaly is noted.  Status post coronary artery bypass graft. No pneumothorax or pleural effusion is noted. No acute pulmonary disease is noted. Bony thorax is unremarkable. IMPRESSION: No acute cardiopulmonary abnormality seen. Electronically Signed   By: Lupita Raider, M.D.   On: 08/08/2017 10:44   Ct Head Code Stroke Wo Contrast  Result Date: 08/08/2017 CLINICAL DATA:  Code stroke. Altered level of consciousness, unexplained. EXAM: CT HEAD WITHOUT CONTRAST TECHNIQUE: Contiguous axial images were obtained from the base of the skull through the vertex without intravenous contrast. COMPARISON:  None available FINDINGS: Brain: No evidence of acute infarction, hemorrhage, hydrocephalus, extra-axial collection or mass lesion/mass effect. Moderate remote left frontal infarct anteriorly. This is in the ACA distribution. Chronic small vessel ischemic type change in the cerebral white matter. Vascular: Atherosclerotic calcification.  No hyperdense vessel. Skull: Normal. Negative for fracture or focal lesion. Sinuses/Orbits: No acute finding. Other: These results were called by telephone at the time of interpretation on 08/08/2017 at 10:23 am to Dr. Sharman Cheek , who verbally acknowledged these results. ASPECTS [facility] Stroke Program Early CT Score) Not scored with this symptomatology. IMPRESSION: 1. No acute finding. 2. Remote left frontal infarct in the ACA distribution. Electronically Signed   By: Marnee Spring  M.D.   On: 08/08/2017 10:24    EKG:   Orders placed or performed during the hospital encounter of 08/08/17  . ED EKG  . ED EKG  . EKG 12-Lead  . EKG 12-Lead  Sinus tachycardia with 110 bpm, no ST-T changes.  IMPRESSION AND PLAN:   62 year old male patient with multiple medical problems of hypertension, diabetes mellitus type 2, coronary artery disease status post CABG, PE, on Coumadin gotten by EMS for syncope, possible seizure with frothing coming out of mouth, agitated and confused after the event.  His  friend who is visiting him now told me that he was on the floor #1. episode of confusion, syncope, possible seizure: Patient admitted to stroke unit, he has risk factors of diabetes, hypertension, hyperlipidemia, CAD.  Check MRA, MRI of the brain without contrast, EEG, monitor on off unit telemetry, Dr. Thana Farr from neurology.  Patient already on aspirin, Coumadin.  Patient will be monitored overnight, continue seizure precautions.  Patient workup did not show any abnormality, chest x-ray did not show pneumonia, UTI is also clear, normal kidney function.  Troponins all also negative without any EKG changes.  She will we will keep him overnight and get workup for possible stroke versus seizure. Continue  aspirin, statins 2.  Diabetes mellitus type 2; the patient takes Glucotrol XL, metformin.  Check stroke swallow screen, if started on diet restart the medicines.  Continue sliding scale insulin with coverage at this time.  3.  History of PE: Patient takes Coumadin 7 mg at night every day, INR is 3.4.  Pharmacy to dose anticoagulation.  All the records are reviewed and case discussed with ED provider. Management plans discussed with the patient, family and they are in agreement.  CODE STATUS:full  TOTAL TIME TAKING CARE OF THIS PATIENT: 55 minutes.    Katha Hamming M.D on 08/08/2017 at 1:22 PM  Between 7am to 6pm - Pager - [phone]  After 6pm go to [email or web address] - password [email or web address]  [facility]  Office  [phone]  CC: Primary care physician; Leotis Shames, MD  Note: This dictation was prepared with Dragon dictation along with smaller phrase technology. Any transcriptional errors that result from this process are unintentional.

## 2017-08-08 NOTE — Consult Note (Signed)
ANTICOAGULATION CONSULT NOTE - Initial Consult  Pharmacy Consult for Warfarin Dosing  Indication: pulmonary embolus and DVT  No Known Allergies  Patient Measurements: Height: 5\' 6"  (167.6 cm) Weight: 180 lb (81.6 kg) IBW/kg (Calculated) : 63.8  Vital Signs: Temp: 98.3 F (36.8 C) (03/06 1359) Temp Source: Oral (03/06 1359) BP: 154/84 (03/06 1359) Pulse Rate: 99 (03/06 1359)  Labs: Recent Labs    08/08/17 1032  HGB 13.0  HCT 39.9*  PLT 193  APTT 47*  LABPROT 34.1*  INR 3.40  CREATININE 0.93  TROPONINI <0.03    Estimated Creatinine Clearance: 82.6 mL/min (by C-G formula based on SCr of 0.93 mg/dL).  Assessment: Pharmacy consulted for warfarin dosing and monitoring in 62 yo male with PMH of DVT and PE. According to Care Everywhere patient takes warfarin 6.5 mg daily at home. INR supratherapeutic on admission at 3.40  DATE INR DOSE 3/6 3.40 Held   Goal of Therapy:  INR 2-3 Monitor platelets by anticoagulation protocol: Yes   Plan:  Will hold warfarin tonight due to supratherapeutic INR.  Plan to restart warfarin when INR closer to therapeutic range.  INR ordered with AM labs.   Gardner Candle, PharmD, BCPS Clinical Pharmacist 08/08/2017 2:05 PM

## 2017-08-09 ENCOUNTER — Observation Stay
Admit: 2017-08-09 | Discharge: 2017-08-09 | Disposition: A | Discharge status: Home / Self Care | Payer: Managed Care, Other (non HMO) | Attending: Internal Medicine | Admitting: Internal Medicine

## 2017-08-09 ENCOUNTER — Observation Stay: Payer: Managed Care, Other (non HMO)

## 2017-08-09 DIAGNOSIS — R410 Disorientation, unspecified: Secondary | ICD-10-CM

## 2017-08-09 LAB — LIPID PANEL
CHOL/HDL RATIO: 4.4 ratio
Cholesterol: 155 mg/dL (ref 0–200)
HDL: 35 mg/dL — ABNORMAL LOW (ref >40)
LDL CALC: 97 mg/dL (ref 0–99)
TRIGLYCERIDES: 117 mg/dL (ref <150)
VLDL: 23 mg/dL (ref 0–40)

## 2017-08-09 LAB — ECHOCARDIOGRAM COMPLETE
HEIGHTINCHES: 66 in
Weight: 2880 oz

## 2017-08-09 LAB — HEMOGLOBIN A1C
Hgb A1c MFr Bld: 6.5 % — ABNORMAL HIGH (ref 4.8–5.6)
Mean Plasma Glucose: 139.85 mg/dL

## 2017-08-09 LAB — GLUCOSE, CAPILLARY
GLUCOSE-CAPILLARY: 129 mg/dL — AB (ref 65–99)
Glucose-Capillary: 115 mg/dL — ABNORMAL HIGH (ref 65–99)
Glucose-Capillary: 160 mg/dL — ABNORMAL HIGH (ref 65–99)

## 2017-08-09 LAB — PROTIME-INR
INR: 2.79
Prothrombin Time: 29.2 seconds — ABNORMAL HIGH (ref 11.4–15.2)

## 2017-08-09 MED ORDER — WARFARIN SODIUM 6 MG PO TABS
6.0000 mg | ORAL_TABLET | Freq: Every day | ORAL | Status: DC
  Filled 2017-08-09: qty 1

## 2017-08-09 MED ORDER — WARFARIN - PHARMACIST DOSING INPATIENT: Freq: Every day | Status: DC

## 2017-08-09 NOTE — Progress Notes (Signed)
SLP Cancellation Note  Patient Details Name: Thomas Moore MRN: [id number] DOB: 1955/11/09   Cancelled treatment:       Reason Eval/Treat Not Completed: SLP screened, no needs identified, will sign off(chart reviewed; consulted NSG then met w/ pt/friends). Pt denied any difficulty swallowing and is currently on a regular diet; tolerates swallowing pills w/ water per NSG. Pt conversed at conversational level w/out cognitive-linguisitc deficits noted. Pt does appear to have some decreased articulation of speech (<MILD) - unsure if related to pt possibly biting his tongue(blood in his mouth at admission), tight frenulum. Pt and friends present in room whom pt spends much time with denied any new speech-articulation deficits.  No further skilled ST services indicated as pt stated he is at his baseline. Pt agreed. NSG to reconsult if any change in status.    Orinda Kenner, MS, CCC-SLP Watson,Katherine 08/09/2017, 10:56 AM

## 2017-08-09 NOTE — Discharge Instructions (Signed)

## 2017-08-09 NOTE — Evaluation (Signed)
Occupational Therapy Evaluation Patient Details Name: Thomas Moore MRN: [id number] DOB: 07-02-1955 Today's Date: 08/09/2017    History of Present Illness Pt. is a 62 y.o. male who was admitted to [facility] follwoing a choking choking episode at a local restuarant. Pt. PMHx includes: HTN, DM, Type II DM, Hyperlipdidemia, corneal transplant in the right eye, and history of left knee surgery.   Clinical Impression   Pt. Presents with pain in his Right shoulder from a previous RTC injury. Pt. resides at home alone in a second storey apartment with 16 steps to enter. Pt. Was independent with ADLs, IADLs, driving, and working in dining services at [employer]. Pt. Reports being at his baseline with self-care functioning. No further OT services are indicated at this time. Pt. Plans to return home upon discharge. Will complete the order at this time.    Follow Up Recommendations  No OT follow up    Equipment Recommendations       Recommendations for Other Services       Precautions / Restrictions Precautions Precaution Comments: seizure precautions Restrictions Weight Bearing Restrictions: No      Mobility Bed Mobility Overal bed mobility: Independent                Transfers Overall transfer level: Independent                    Balance                                           ADL either performed or assessed with clinical judgement   ADL Overall ADL's : Needs assistance/impaired Eating/Feeding: Independent   Grooming: Independent   Upper Body Bathing: Independent   Lower Body Bathing: Independent;Set up   Upper Body Dressing : Independent;Set up   Lower Body Dressing: Set up;Independent               Functional mobility during ADLs: Independent       Vision Baseline Vision/History: Wears glasses Wears Glasses: At all times Patient Visual Report: No change from baseline       Perception     Praxis      Pertinent  Vitals/Pain Pain Assessment: Faces Faces Pain Scale: Hurts even more Pain Location: right shoulder with ROM Pain Descriptors / Indicators: Aching Pain Intervention(s): Limited activity within patient's tolerance     Hand Dominance Right   Extremity/Trunk Assessment Upper Extremity Assessment Upper Extremity Assessment: RUE deficits/detail RUE Deficits / Details: Limited RUE ROM from a previous RTC injury.           Communication Communication Communication: No difficulties   Cognition Arousal/Alertness: Awake/alert Behavior During Therapy: WFL for tasks assessed/performed Overall Cognitive Status: Within Functional Limits for tasks assessed                                     General Comments       Exercises     Shoulder Instructions      Home Living Family/patient expects to be discharged to:: Private residence Living Arrangements: Alone           Home Layout: One level(Second floor apartment)     Bathroom Shower/Tub: Tub/shower unit;Curtain         Home Equipment: None  Prior Functioning/Environment Level of Independence: Independent        Comments: Independent with ADLs, IADLs, 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        OT Problem List:        OT Treatment/Interventions:      OT Goals(Current goals can be found in the care plan section) Acute Rehab OT Goals Patient Stated Goal: UE strength OT Goal Formulation: With patient Potential to Achieve Goals: Good  OT Frequency:     Barriers to D/C:            Co-evaluation              AM-PAC PT "6 Clicks" Daily Activity     Outcome Measure Help from another person eating meals?: None Help from another person taking care of personal grooming?: None Help from another person toileting, which includes using toliet, bedpan, or urinal?: None Help from another person bathing (including washing, rinsing, drying)?: None Help from another person to put on  and taking off regular upper body clothing?: None Help from another person to put on and taking off regular lower body clothing?: None 6 Click Score: 24   End of Session Equipment Utilized During Treatment: Gait belt  Activity Tolerance: Patient tolerated treatment well Patient left: in bed                   Time: 0946-1005 OT Time Calculation (min): 19 min Charges:  OT General Charges $OT Visit: 1 Visit OT Evaluation $OT Eval Low Complexity: 1 Low G-Codes:     Olegario Messier, MS, OTR/L   Olegario Messier, MS, OTR/L 08/09/2017, 10:45 AM

## 2017-08-09 NOTE — Progress Notes (Signed)
Subjective: Patient reports that he is back to baseline.  Remains amnestic of many of the events of yesterday.    Objective: Current vital signs: BP 122/66 (BP Location: Left Arm)   Pulse 75   Temp (!) 97.5 F (36.4 C) (Oral)   Resp 18   Ht 5\' 6"  (1.676 m)   Wt 81.6 kg (180 lb)   SpO2 99%   BMI 29.05 kg/m  Vital signs in last 24 hours: Temp:  [97.5 F (36.4 C)-98.9 F (37.2 C)] 97.5 F (36.4 C) (03/07 0445) Pulse Rate:  [75-110] 75 (03/07 0445) Resp:  [12-20] 18 (03/07 0445) BP: (112-154)/(61-84) 122/66 (03/07 0445) SpO2:  [94 %-99 %] 99 % (03/07 0445)  Intake/Output from previous day: 03/06 0701 - 03/07 0700 In: 795 [P.O.:240; I.V.:555] Out: 2500 [Urine:2500] Intake/Output this shift: Total I/O In: 360 [P.O.:360] Out: -  Nutritional status: Fall precautions Diet heart healthy/carb modified Room service appropriate? Yes; Fluid consistency: Thin  Neurologic Exam: Mental Status: Alert and oriented.  Speech fluent without evidence of aphasia.  Able to follow 3 step commands but requires extensive reinforcement. Cranial Nerves: II: Discs flat bilaterally; Visual fields grossly normal, pupils equal, round, reactive to light and accommodation III,IV, VI: ptosis not present, extra-ocular motions intact bilaterally V,VII: smile symmetric, facial light touch sensation normal bilaterally VIII: hearing normal bilaterally IX,X: gag reflex present XI: bilateral shoulder shrug XII: midline tongue extension Motor: 5/5 throughout Sensory: Pinprick and light touch intact throughout, bilaterally  Lab Results: Basic Metabolic Panel: Recent Labs  Lab 08/08/17 1032  NA 138  K 4.1  CL 103  CO2 19*  GLUCOSE 164*  BUN 21*  CREATININE 0.93  CALCIUM 8.9    Liver Function Tests: Recent Labs  Lab 08/08/17 1032  AST 56*  ALT 52  ALKPHOS 88  BILITOT 0.5  PROT 7.8  ALBUMIN 3.8   No results for input(s): LIPASE, AMYLASE in the last 168 hours. No results for input(s):  AMMONIA in the last 168 hours.  CBC: Recent Labs  Lab 08/08/17 1032  WBC 4.8  NEUTROABS 3.2  HGB 13.0  HCT 39.9*  MCV 86.1  PLT 193    Cardiac Enzymes: Recent Labs  Lab 08/08/17 1032  TROPONINI <0.03    Lipid Panel: Recent Labs  Lab 08/09/17 0452  CHOL 155  TRIG 117  HDL 35*  CHOLHDL 4.4  VLDL 23  LDLCALC 97    CBG: Recent Labs  Lab 08/08/17 1021 08/08/17 1757 08/08/17 2134 08/09/17 0750 08/09/17 1134  GLUCAP 134* 111* 115* 129* 160*    Microbiology: No results found for this or any previous visit.  Coagulation Studies: Recent Labs    08/08/17 1032 08/09/17 0452  LABPROT 34.1* 29.2*  INR 3.40 2.79    Imaging: Mr Brain Wo Contrast  Result Date: 08/08/2017 CLINICAL DATA:  Altered mental status EXAM: MRI HEAD WITHOUT CONTRAST MRA HEAD WITHOUT CONTRAST TECHNIQUE: Multiplanar, multiecho pulse sequences of the brain and surrounding structures were obtained without intravenous contrast. Angiographic images of the head were obtained using MRA technique without contrast. COMPARISON:  CT 08/08/2017. FINDINGS: MRI HEAD FINDINGS Brain: The midline structures are normal. No focal diffusion restriction to indicate acute infarct. No intraparenchymal hemorrhage. Old left frontal lobe infarct. Multifocal periventricular white matter hyperintensity. No mass lesion. No chronic microhemorrhage or cerebral amyloid angiopathy. No hydrocephalus, age advanced atrophy or lobar predominant volume loss. No dural abnormality or extra-axial collection. Skull and upper cervical spine: The visualized skull base, calvarium, upper cervical spine and  extracranial soft tissues are normal. Sinuses/Orbits: No fluid levels or advanced mucosal thickening. No mastoid effusion. Normal orbits. MRA HEAD FINDINGS Intracranial internal carotid arteries: Atherosclerotic irregularity of both distal carotid siphons. Anterior cerebral arteries: Normal variant absent left A1 segment. Middle cerebral  arteries: Normal. Posterior communicating arteries: Present bilaterally. Posterior cerebral arteries: Normal. Basilar artery: Normal. Vertebral arteries: Codominant.  Normal. Superior cerebellar arteries: Normal. Anterior inferior cerebellar arteries: Normal. Posterior inferior cerebellar arteries: Normal. IMPRESSION: 1. No acute intracranial abnormality or emergent large vessel occlusion. 2. Chronic ischemic microangiopathy and old left frontal lobe infarct. 3. Bilateral atherosclerotic irregularity of the distal carotid siphons. Electronically Signed   By: Deatra Robinson M.D.   On: 08/08/2017 17:57   US Carotid Bilateral (at [facility] Only)  Result Date: 08/08/2017 CLINICAL DATA:  62 year old male with confusion EXAM: BILATERAL CAROTID DUPLEX ULTRASOUND TECHNIQUE: Wallace Cullens scale imaging, color Doppler and duplex ultrasound were performed of bilateral carotid and vertebral arteries in the neck. COMPARISON:  Remote carotid duplex ultrasound 07/01/2003 FINDINGS: Criteria: Quantification of carotid stenosis is based on velocity parameters that correlate the residual internal carotid diameter with NASCET-based stenosis levels, using the diameter of the distal internal carotid lumen as the denominator for stenosis measurement. The following velocity measurements were obtained: RIGHT ICA:  90/26 cm/sec CCA:  77/18 cm/sec SYSTOLIC ICA/CCA RATIO:  1.2 DIASTOLIC ICA/CCA RATIO:  1.4 ECA:  72 cm/sec LEFT ICA:  79/26 cm/sec CCA:  60/12 cm/sec SYSTOLIC ICA/CCA RATIO:  1.3 DIASTOLIC ICA/CCA RATIO:  2.2 ECA:  86 cm/sec RIGHT CAROTID ARTERY: Heterogeneous calcified atherosclerotic plaque in the proximal internal carotid artery. By peak systolic velocity criteria, the estimated stenosis remains less than 50%. RIGHT VERTEBRAL ARTERY:  Patent with normal antegrade flow. LEFT CAROTID ARTERY: Heterogeneous atherosclerotic plaque in the proximal internal carotid artery. By peak systolic velocity criteria, the estimated stenosis remains  less than 50%. LEFT VERTEBRAL ARTERY:  Patent with normal antegrade flow. IMPRESSION: 1. Mild (1-49%) stenosis proximal right internal carotid artery secondary to heterogenous atherosclerotic plaque. 2. Mild (1-49%) stenosis proximal left internal carotid artery secondary to heterogenous atherosclerotic plaque. 3. Vertebral arteries are patent with normal antegrade flow. 4. Irregular heart rate. Does the patient have a known history of atrial fibrillation? Signed, Sterling Big, MD [facility] Radiology Electronically Signed   By: Malachy Moan M.D.   On: 08/08/2017 17:32   Dg Chest Portable 1 View  Result Date: 08/08/2017 CLINICAL DATA:  Chest pain, shortness of breath. EXAM: PORTABLE CHEST 1 VIEW COMPARISON:  None available currently. FINDINGS: Mild cardiomegaly is noted. Status post coronary artery bypass graft. No pneumothorax or pleural effusion is noted. No acute pulmonary disease is noted. Bony thorax is unremarkable. IMPRESSION: No acute cardiopulmonary abnormality seen. Electronically Signed   By: Lupita Raider, M.D.   On: 08/08/2017 10:44   Mr Maxine Glenn Head/brain [facility] Cm  Result Date: 08/08/2017 CLINICAL DATA:  Altered mental status EXAM: MRI HEAD WITHOUT CONTRAST MRA HEAD WITHOUT CONTRAST TECHNIQUE: Multiplanar, multiecho pulse sequences of the brain and surrounding structures were obtained without intravenous contrast. Angiographic images of the head were obtained using MRA technique without contrast. COMPARISON:  CT 08/08/2017. FINDINGS: MRI HEAD FINDINGS Brain: The midline structures are normal. No focal diffusion restriction to indicate acute infarct. No intraparenchymal hemorrhage. Old left frontal lobe infarct. Multifocal periventricular white matter hyperintensity. No mass lesion. No chronic microhemorrhage or cerebral amyloid angiopathy. No hydrocephalus, age advanced atrophy or lobar predominant volume loss. No dural abnormality or  extra-axial collection. Skull and upper cervical spine: The visualized skull base, calvarium, upper cervical spine and extracranial soft tissues are normal. Sinuses/Orbits: No fluid levels or advanced mucosal thickening. No mastoid effusion. Normal orbits. MRA HEAD FINDINGS Intracranial internal carotid arteries: Atherosclerotic irregularity of both distal carotid siphons. Anterior cerebral arteries: Normal variant absent left A1 segment. Middle cerebral arteries: Normal. Posterior communicating arteries: Present bilaterally. Posterior cerebral arteries: Normal. Basilar artery: Normal. Vertebral arteries: Codominant.  Normal. Superior cerebellar arteries: Normal. Anterior inferior cerebellar arteries: Normal. Posterior inferior cerebellar arteries: Normal. IMPRESSION: 1. No acute intracranial abnormality or emergent large vessel occlusion. 2. Chronic ischemic microangiopathy and old left frontal lobe infarct. 3. Bilateral atherosclerotic irregularity of the distal carotid siphons. Electronically Signed   By: Deatra Robinson M.D.   On: 08/08/2017 17:57   Ct Head Code Stroke Wo Contrast  Result Date: 08/08/2017 CLINICAL DATA:  Code stroke. Altered level of consciousness, unexplained. EXAM: CT HEAD WITHOUT CONTRAST TECHNIQUE: Contiguous axial images were obtained from the base of the skull through the vertex without intravenous contrast. COMPARISON:  None available FINDINGS: Brain: No evidence of acute infarction, hemorrhage, hydrocephalus, extra-axial collection or mass lesion/mass effect. Moderate remote left frontal infarct anteriorly. This is in the ACA distribution. Chronic small vessel ischemic type change in the cerebral white matter. Vascular: Atherosclerotic calcification.  No hyperdense vessel. Skull: Normal. Negative for fracture or focal lesion. Sinuses/Orbits: No acute finding. Other: These results were called by telephone at the time of interpretation on 08/08/2017 at 10:23 am to Dr. Sharman Cheek , who  verbally acknowledged these results. ASPECTS [facility] Stroke Program Early CT Score) Not scored with this symptomatology. IMPRESSION: 1. No acute finding. 2. Remote left frontal infarct in the ACA distribution. Electronically Signed   By: Marnee Spring M.D.   On: 08/08/2017 10:24    Medications:  I have reviewed the patient's current medications. Scheduled: . aspirin EC  81 mg Oral Daily  . atorvastatin  40 mg Oral q1800  . furosemide  80 mg Oral Daily  . glipiZIDE  2.5 mg Oral Q breakfast  . insulin aspart  0-9 Units Subcutaneous TID WC  . linagliptin  5 mg Oral Daily  . metFORMIN  1,000 mg Oral BID WC  . metoprolol succinate  50 mg Oral Daily  . ramipril  10 mg Oral Daily  . warfarin  6 mg Oral q1800  . Warfarin - Pharmacist Dosing Inpatient   Does not apply q1800    Assessment/Plan: Patient at baseline.  No further events noted.   Unclear etiology.  MRI of the brain reviewed and shows no acute changes.  Patient supratherapeutic with Coumadin.  Carotid dopplers show no evidence of hemodynamically significant stenosis.  Echocardiogram pending.  A1c 6.5, LDL 97. Seizure remains on the differential.  Recommendations: 1. EEG pending.  Unless evidence of epileptiform discharges on EEG antiepileptic therapy not indicated at this time.  Patient to follow up with neurology on an outpatient basis.     LOS: 0 days   Thana Farr, MD Neurology [phone] 08/09/2017  12:41 PM

## 2017-08-09 NOTE — Progress Notes (Signed)
*  PRELIMINARY RESULTS* Echocardiogram 2D Echocardiogram has been performed.  Cristela Blue 08/09/2017, 9:36 AM

## 2017-08-09 NOTE — Evaluation (Signed)
Physical Therapy Evaluation Patient Details Name: Thomas Moore MRN: [id number] DOB: 06-18-1955 Today's Date: 08/09/2017   History of Present Illness  Pt. is a 62 y.o. male who was admitted to [facility] follwoing a choking episode at a local restuarant. MRI of the brain showed no acute intracranial abnormality. Pt. PMHx includes: HTN, DM, Type II DM, Hyperlipdidemia, corneal transplant in the right eye, and history of left knee surgery.    Clinical Impression  Pt admitted with above presentation. Pt is independent with all aspects of mobility at baseline.  He continues to demonstrate independence with bed mobility, transfers, and ambulation.  No signs of instability with higher level balance activities while ambulating.  No skilled PT needs identified, PT will sign off. Educated pt in the signs and symptoms of a stroke and to call 911 should he experience any of these.  Pt verbalized understanding. Pt reports family h/o stroke.     Follow Up Recommendations No PT follow up    Equipment Recommendations  None recommended by PT    Recommendations for Other Services       Precautions / Restrictions Precautions Precautions: Other (comment) Precaution Comments: seizure precautions Restrictions Weight Bearing Restrictions: No      Mobility  Bed Mobility Overal bed mobility: Independent             General bed mobility comments: No physical assist or cues needed, pt performs independently.   Transfers Overall transfer level: Independent Equipment used: None             General transfer comment: No physical assist or cues needed, pt performs independently.   Ambulation/Gait Ambulation/Gait assistance: Independent Ambulation Distance (Feet): 240 Feet Assistive device: None Gait Pattern/deviations: WFL(Within Functional Limits)   Gait velocity interpretation: at or above normal speed for age/gender General Gait Details: No gait abnormalities appreciated.  Pt steady and no  signs of instability with higher level balance activities.   Stairs            Wheelchair Mobility    Modified Rankin (Stroke Patients Only)       Balance Overall balance assessment: Independent                               Standardized Balance Assessment Standardized Balance Assessment : Dynamic Gait Index   Dynamic Gait Index Level Surface: Normal Change in Gait Speed: Normal Gait with Horizontal Head Turns: Normal Gait with Vertical Head Turns: Normal Gait and Pivot Turn: Normal Step Over Obstacle: Normal Step Around Obstacles: Normal       Pertinent Vitals/Pain Pain Assessment: No/denies pain(only has pain with active motion of R shoulder) Pain Intervention(s): Limited activity within patient's tolerance;Monitored during session    Home Living Family/patient expects to be discharged to:: Private residence Living Arrangements: Alone   Type of Home: Apartment Home Access: Stairs to enter Entrance Stairs-Rails: Lawyer of Steps: flight Home Layout: One level Home Equipment: None      Prior Function Level of Independence: Independent         Comments: Independent with ADLs, IADLs, 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  Pt reports 1 fall in the past 6 months when steeping off the sidewalk.  Pt ambulates without AD.      Hand Dominance   Dominant Hand: Right    Extremity/Trunk Assessment   Upper Extremity Assessment Upper Extremity Assessment: Defer to OT evaluation RUE Deficits / Details: Limited RUE  ROM from a previous RTC injury.    Lower Extremity Assessment Lower Extremity Assessment: Overall WFL for tasks assessed(strength, coordination, sensation all WNL)       Communication   Communication: Other (comment)(slight lisp )  Cognition Arousal/Alertness: Awake/alert Behavior During Therapy: WFL for tasks assessed/performed Overall Cognitive Status: Within Functional Limits for tasks assessed                                         General Comments General comments (skin integrity, edema, etc.): Educated pt in the signs and symptoms of a stroke and to call 911 should he experience any of these.  Pt verbalized understanding.     Exercises     Assessment/Plan    PT Assessment Patent does not need any further PT services  PT Problem List         PT Treatment Interventions      PT Goals (Current goals can be found in the Care Plan section)  Acute Rehab PT Goals Patient Stated Goal: to go home today PT Goal Formulation: All assessment and education complete, DC therapy    Frequency     Barriers to discharge        Co-evaluation               AM-PAC PT "6 Clicks" Daily Activity  Outcome Measure Difficulty turning over in bed (including adjusting bedclothes, sheets and blankets)?: None Difficulty moving from lying on back to sitting on the side of the bed? : None Difficulty sitting down on and standing up from a chair with arms (e.g., wheelchair, bedside commode, etc,.)?: None Help needed moving to and from a bed to chair (including a wheelchair)?: None Help needed walking in hospital room?: None Help needed climbing 3-5 steps with a railing? : None 6 Click Score: 24    End of Session Equipment Utilized During Treatment: Gait belt Activity Tolerance: Patient tolerated treatment well Patient left: in bed;with call bell/phone within reach;with family/visitor present Nurse Communication: Mobility status PT Visit Diagnosis: Unsteadiness on feet (R26.81)    Time: 9147-8295 PT Time Calculation (min) (ACUTE ONLY): 20 min   Charges:   PT Evaluation $PT Eval Low Complexity: 1 Low PT Treatments $Gait Training: 8-22 mins   PT G CodesEncarnacion Moore PT, DPT 08/09/2017, 11:40 AM

## 2017-08-09 NOTE — Discharge Summary (Signed)
[employer] [facility]   PATIENT NAME: Thomas Moore    MR#:  [id number]  DATE OF BIRTH:  [birth year]  DATE OF ADMISSION:  08/08/2017 ADMITTING PHYSICIAN: Katha Hamming, MD  DATE OF DISCHARGE: 08/09/2017  PRIMARY CARE PHYSICIAN: Leotis Shames, MD    ADMISSION DIAGNOSIS:  Confusion [R41.0] Acute encephalopathy [G93.40]  DISCHARGE DIAGNOSIS:  Active Problems:   Altered mental status   SECONDARY DIAGNOSIS:   Past Medical History:  Diagnosis Date  . Coronary artery disease   . Diabetes mellitus without complication (HCC)   . History of single vessel coronary artery bypass   . Hypertension     HOSPITAL COURSE:  62 year old male with history of diabetes and CAD who presents with confusion.  1. Acute encephalopathy/syncope: Etiology of encephalopathy is unclear at this time. Patient had MRI and cardiac Doppler's which were negative. Telemetry showed no arrhythmia. Patient was back to baseline. He quickly. Patient has no recollection of event. Patient was evaluated by neurology while patient was in the hospital. Patient underwent EEG. Patient will have outpatient follow-up with neurology for EEG results. Patient will follow-up with his cardiologist and may need a visit recorder.  2. Diabetes: Patient continue with ADA diet and outpatient regimen  3. CAD: Continue aspirin, statin and metoprolol 4. History of right leg DVT and PE in 2001 on chronic warfarin therapy    DISCHARGE CONDITIONS AND DIET:   Stable for discharge on heart healthy diet  CONSULTS OBTAINED:  Treatment Team:  Kym Groom, MD  DRUG ALLERGIES:  No Known Allergies  DISCHARGE MEDICATIONS:   Allergies as of 08/09/2017   No Known Allergies     Medication List    TAKE these medications   aspirin EC 81 MG tablet Take 81 mg by mouth.   atorvastatin 40 MG tablet Commonly known as:  LIPITOR Take 40 mg by mouth.   furosemide 80 MG tablet Commonly known as:   LASIX Take 80 mg by mouth.   gabapentin 300 MG capsule Commonly known as:  NEURONTIN Take 600 mg by mouth.   glipiZIDE 2.5 MG 24 hr tablet Commonly known as:  GLUCOTROL XL Take 2.5 mg by mouth.   JANUVIA 50 MG tablet Generic drug:  sitaGLIPtin Take 1 tablet by mouth  daily   metFORMIN 500 MG 24 hr tablet Commonly known as:  GLUCOPHAGE-XR Take 2 tablets by mouth 2 (two) times daily.   metoprolol succinate 50 MG 24 hr tablet Commonly known as:  TOPROL-XL Take 1 tablet by mouth daily.   Multiple Vitamins tablet Take by mouth.   omeprazole 40 MG capsule Commonly known as:  PRILOSEC Take 40 mg by mouth.   ramipril 10 MG capsule Commonly known as:  ALTACE Take 10 mg by mouth.   TRULICITY 0.75 MG/0.5ML Sopn Generic drug:  Dulaglutide Inject 0.75 mg into the skin every 7 (seven) days.   warfarin 6 MG tablet Commonly known as:  COUMADIN   warfarin 1 MG tablet Commonly known as:  COUMADIN         Today   CHIEF COMPLAINT:  No acute events overnight.   VITAL SIGNS:  Blood pressure 122/66, pulse 75, temperature (!) 97.5 F (36.4 C), temperature source Oral, resp. rate 18, height 5\' 6"  (1.676 m), weight 81.6 kg (180 lb), SpO2 99 %.   REVIEW OF SYSTEMS:  Review of Systems  Constitutional: Negative.  Negative for chills, fever and malaise/fatigue.  HENT: Negative.  Negative for ear discharge, ear pain, hearing loss,  nosebleeds and sore throat.   Eyes: Negative.  Negative for blurred vision and pain.  Respiratory: Negative.  Negative for cough, hemoptysis, shortness of breath and wheezing.   Cardiovascular: Negative.  Negative for chest pain, palpitations and leg swelling.  Gastrointestinal: Negative.  Negative for abdominal pain, blood in stool, diarrhea, nausea and vomiting.  Genitourinary: Negative.  Negative for dysuria.  Musculoskeletal: Negative.  Negative for back pain.  Skin: Negative.   Neurological: Negative for dizziness, tremors, speech change, focal  weakness, seizures and headaches.  Endo/Heme/Allergies: Negative.  Does not bruise/bleed easily.  Psychiatric/Behavioral: Positive for memory loss. Negative for depression, hallucinations and suicidal ideas.     PHYSICAL EXAMINATION:  GENERAL:  62 y.o.-year-old patient lying in the bed with no acute distress.  NECK:  Supple, no jugular venous distention. No thyroid enlargement, no tenderness.  LUNGS: Normal breath sounds bilaterally, no wheezing, rales,rhonchi  No use of accessory muscles of respiration.  CARDIOVASCULAR: S1, S2 normal. No murmurs, rubs, or gallops.  ABDOMEN: Soft, non-tender, non-distended. Bowel sounds present. No organomegaly or mass.  EXTREMITIES: No pedal edema, cyanosis, or clubbing.  PSYCHIATRIC: The patient is alert and oriented x 3.  SKIN: No obvious rash, lesion, or ulcer.   DATA REVIEW:   CBC Recent Labs  Lab 08/08/17 1032  WBC 4.8  HGB 13.0  HCT 39.9*  PLT 193    Chemistries  Recent Labs  Lab 08/08/17 1032  NA 138  K 4.1  CL 103  CO2 19*  GLUCOSE 164*  BUN 21*  CREATININE 0.93  CALCIUM 8.9  AST 56*  ALT 52  ALKPHOS 88  BILITOT 0.5    Cardiac Enzymes Recent Labs  Lab 08/08/17 1032  TROPONINI <0.03    Microbiology Results  @[email or web address] @  RADIOLOGY:  Mr Brain Wo Contrast  Result Date: 08/08/2017 CLINICAL DATA:  Altered mental status EXAM: MRI HEAD WITHOUT CONTRAST MRA HEAD WITHOUT CONTRAST TECHNIQUE: Multiplanar, multiecho pulse sequences of the brain and surrounding structures were obtained without intravenous contrast. Angiographic images of the head were obtained using MRA technique without contrast. COMPARISON:  CT 08/08/2017. FINDINGS: MRI HEAD FINDINGS Brain: The midline structures are normal. No focal diffusion restriction to indicate acute infarct. No intraparenchymal hemorrhage. Old left frontal lobe infarct. Multifocal periventricular white matter hyperintensity. No mass lesion. No chronic microhemorrhage or cerebral amyloid  angiopathy. No hydrocephalus, age advanced atrophy or lobar predominant volume loss. No dural abnormality or extra-axial collection. Skull and upper cervical spine: The visualized skull base, calvarium, upper cervical spine and extracranial soft tissues are normal. Sinuses/Orbits: No fluid levels or advanced mucosal thickening. No mastoid effusion. Normal orbits. MRA HEAD FINDINGS Intracranial internal carotid arteries: Atherosclerotic irregularity of both distal carotid siphons. Anterior cerebral arteries: Normal variant absent left A1 segment. Middle cerebral arteries: Normal. Posterior communicating arteries: Present bilaterally. Posterior cerebral arteries: Normal. Basilar artery: Normal. Vertebral arteries: Codominant.  Normal. Superior cerebellar arteries: Normal. Anterior inferior cerebellar arteries: Normal. Posterior inferior cerebellar arteries: Normal. IMPRESSION: 1. No acute intracranial abnormality or emergent large vessel occlusion. 2. Chronic ischemic microangiopathy and old left frontal lobe infarct. 3. Bilateral atherosclerotic irregularity of the distal carotid siphons. Electronically Signed   By: Deatra Robinson M.D.   On: 08/08/2017 17:57   US Carotid Bilateral (at [facility] And [facility] Only)  Result Date: 08/08/2017 CLINICAL DATA:  62 year old male with confusion EXAM: BILATERAL CAROTID DUPLEX ULTRASOUND TECHNIQUE: Wallace Cullens scale imaging, color Doppler and duplex ultrasound were performed of bilateral carotid and vertebral arteries in the neck. COMPARISON:  Remote  carotid duplex ultrasound 07/01/2003 FINDINGS: Criteria: Quantification of carotid stenosis is based on velocity parameters that correlate the residual internal carotid diameter with NASCET-based stenosis levels, using the diameter of the distal internal carotid lumen as the denominator for stenosis measurement. The following velocity measurements were obtained: RIGHT ICA:  90/26 cm/sec CCA:  77/18 cm/sec SYSTOLIC ICA/CCA RATIO:  1.2 DIASTOLIC  ICA/CCA RATIO:  1.4 ECA:  72 cm/sec LEFT ICA:  79/26 cm/sec CCA:  60/12 cm/sec SYSTOLIC ICA/CCA RATIO:  1.3 DIASTOLIC ICA/CCA RATIO:  2.2 ECA:  86 cm/sec RIGHT CAROTID ARTERY: Heterogeneous calcified atherosclerotic plaque in the proximal internal carotid artery. By peak systolic velocity criteria, the estimated stenosis remains less than 50%. RIGHT VERTEBRAL ARTERY:  Patent with normal antegrade flow. LEFT CAROTID ARTERY: Heterogeneous atherosclerotic plaque in the proximal internal carotid artery. By peak systolic velocity criteria, the estimated stenosis remains less than 50%. LEFT VERTEBRAL ARTERY:  Patent with normal antegrade flow. IMPRESSION: 1. Mild (1-49%) stenosis proximal right internal carotid artery secondary to heterogenous atherosclerotic plaque. 2. Mild (1-49%) stenosis proximal left internal carotid artery secondary to heterogenous atherosclerotic plaque. 3. Vertebral arteries are patent with normal antegrade flow. 4. Irregular heart rate. Does the patient have a known history of atrial fibrillation? Signed, Sterling Big, MD [facility]iology Electronically Signed   By: Malachy Moan M.D.   On: 08/08/2017 17:32   Dg Chest Portable 1 View  Result Date: 08/08/2017 CLINICAL DATA:  Chest pain, shortness of breath. EXAM: PORTABLE CHEST 1 VIEW COMPARISON:  None available currently. FINDINGS: Mild cardiomegaly is noted. Status post coronary artery bypass graft. No pneumothorax or pleural effusion is noted. No acute pulmonary disease is noted. Bony thorax is unremarkable. IMPRESSION: No acute cardiopulmonary abnormality seen. Electronically Signed   By: Lupita Raider, M.D.   On: 08/08/2017 10:44   Mr Maxine Glenn Head/brain [facility] Cm  Result Date: 08/08/2017 CLINICAL DATA:  Altered mental status EXAM: MRI HEAD WITHOUT CONTRAST MRA HEAD WITHOUT CONTRAST TECHNIQUE: Multiplanar, multiecho pulse sequences of the brain and surrounding structures were  obtained without intravenous contrast. Angiographic images of the head were obtained using MRA technique without contrast. COMPARISON:  CT 08/08/2017. FINDINGS: MRI HEAD FINDINGS Brain: The midline structures are normal. No focal diffusion restriction to indicate acute infarct. No intraparenchymal hemorrhage. Old left frontal lobe infarct. Multifocal periventricular white matter hyperintensity. No mass lesion. No chronic microhemorrhage or cerebral amyloid angiopathy. No hydrocephalus, age advanced atrophy or lobar predominant volume loss. No dural abnormality or extra-axial collection. Skull and upper cervical spine: The visualized skull base, calvarium, upper cervical spine and extracranial soft tissues are normal. Sinuses/Orbits: No fluid levels or advanced mucosal thickening. No mastoid effusion. Normal orbits. MRA HEAD FINDINGS Intracranial internal carotid arteries: Atherosclerotic irregularity of both distal carotid siphons. Anterior cerebral arteries: Normal variant absent left A1 segment. Middle cerebral arteries: Normal. Posterior communicating arteries: Present bilaterally. Posterior cerebral arteries: Normal. Basilar artery: Normal. Vertebral arteries: Codominant.  Normal. Superior cerebellar arteries: Normal. Anterior inferior cerebellar arteries: Normal. Posterior inferior cerebellar arteries: Normal. IMPRESSION: 1. No acute intracranial abnormality or emergent large vessel occlusion. 2. Chronic ischemic microangiopathy and old left frontal lobe infarct. 3. Bilateral atherosclerotic irregularity of the distal carotid siphons. Electronically Signed   By: Deatra Robinson M.D.   On: 08/08/2017 17:57   Ct Head Code Stroke Wo Contrast  Result Date: 08/08/2017 CLINICAL DATA:  Code stroke. Altered level of consciousness, unexplained. EXAM: CT HEAD WITHOUT CONTRAST TECHNIQUE: Contiguous axial images were obtained  from the base of the skull through the vertex without intravenous contrast. COMPARISON:  None  available FINDINGS: Brain: No evidence of acute infarction, hemorrhage, hydrocephalus, extra-axial collection or mass lesion/mass effect. Moderate remote left frontal infarct anteriorly. This is in the ACA distribution. Chronic small vessel ischemic type change in the cerebral white matter. Vascular: Atherosclerotic calcification.  No hyperdense vessel. Skull: Normal. Negative for fracture or focal lesion. Sinuses/Orbits: No acute finding. Other: These results were called by telephone at the time of interpretation on 08/08/2017 at 10:23 am to Dr. Sharman Cheek , who verbally acknowledged these results. ASPECTS [facility] Stroke Program Early CT Score) Not scored with this symptomatology. IMPRESSION: 1. No acute finding. 2. Remote left frontal infarct in the ACA distribution. Electronically Signed   By: Marnee Spring M.D.   On: 08/08/2017 10:24      Allergies as of 08/09/2017   No Known Allergies     Medication List    TAKE these medications   aspirin EC 81 MG tablet Take 81 mg by mouth.   atorvastatin 40 MG tablet Commonly known as:  LIPITOR Take 40 mg by mouth.   furosemide 80 MG tablet Commonly known as:  LASIX Take 80 mg by mouth.   gabapentin 300 MG capsule Commonly known as:  NEURONTIN Take 600 mg by mouth.   glipiZIDE 2.5 MG 24 hr tablet Commonly known as:  GLUCOTROL XL Take 2.5 mg by mouth.   JANUVIA 50 MG tablet Generic drug:  sitaGLIPtin Take 1 tablet by mouth  daily   metFORMIN 500 MG 24 hr tablet Commonly known as:  GLUCOPHAGE-XR Take 2 tablets by mouth 2 (two) times daily.   metoprolol succinate 50 MG 24 hr tablet Commonly known as:  TOPROL-XL Take 1 tablet by mouth daily.   Multiple Vitamins tablet Take by mouth.   omeprazole 40 MG capsule Commonly known as:  PRILOSEC Take 40 mg by mouth.   ramipril 10 MG capsule Commonly known as:  ALTACE Take 10 mg by mouth.   TRULICITY 0.75 MG/0.5ML Sopn Generic drug:  Dulaglutide Inject 0.75 mg into the skin  every 7 (seven) days.   warfarin 6 MG tablet Commonly known as:  COUMADIN   warfarin 1 MG tablet Commonly known as:  COUMADIN          Management plans discussed with the patient and he is in agreement. Stable for discharge home  Patient should follow up with pcp  CODE STATUS:     Code Status Orders  (From admission, onward)        Start     Ordered   08/08/17 1252  Full code  Continuous     08/08/17 1256    Code Status History    Date Active Date Inactive Code Status Order ID Comments User Context   This patient has a current code status but no historical code status.      TOTAL TIME TAKING CARE OF THIS PATIENT: 38 minutes.    Note: This dictation was prepared with Dragon dictation along with smaller phrase technology. Any transcriptional errors that result from this process are unintentional.  Japhet Morgenthaler M.D on 08/09/2017 at 10:49 AM  Between 7am to 6pm - Pager - [phone] After 6pm go to [email or web address] - Social research officer, government  [facility]  Office  [phone]  CC: Primary care physician; Leotis Shames, MD

## 2017-08-09 NOTE — Progress Notes (Signed)
Patient discharged home per MD order. All discharge instructions given and all questions answered. 

## 2017-08-09 NOTE — Consult Note (Addendum)
ANTICOAGULATION CONSULT NOTE - Initial Consult  Pharmacy Consult for Warfarin Dosing  Indication: pulmonary embolus and DVT  No Known Allergies  Patient Measurements: Height: 5\' 6"  (167.6 cm) Weight: 180 lb (81.6 kg) IBW/kg (Calculated) : 63.8  Vital Signs: Temp: 97.5 F (36.4 C) (03/07 0445) Temp Source: Oral (03/07 0445) BP: 122/66 (03/07 0445) Pulse Rate: 75 (03/07 0445)  Labs: Recent Labs    08/08/17 1032 08/09/17 0452  HGB 13.0  --   HCT 39.9*  --   PLT 193  --   APTT 47*  --   LABPROT 34.1* 29.2*  INR 3.40 2.79  CREATININE 0.93  --   TROPONINI <0.03  --     Estimated Creatinine Clearance: 82.6 mL/min (by C-G formula based on SCr of 0.93 mg/dL).  Assessment: Pharmacy consulted for warfarin dosing and monitoring in 62 yo male with PMH of DVT and PE. According to Care Everywhere patient takes warfarin 6.5 mg daily at home. INR supratherapeutic on admission at 3.40  DATE INR DOSE 3/6 3.40 Held  3/7       2.79  Goal of Therapy:  INR 2-3 Monitor platelets by anticoagulation protocol: Yes   Plan:  INR now therapeutic. It appears according to Care Everywhere that pt has had a supratherapeutic INR the last few INR checks. I will therefore decrease dose slightly (~10%) to 6mg  daily. INR in the AM  Addendum: Spoke with pt- he states that he was taking 7mg  daily of warfarin. Was on 6.5mg  daily but INR had been low. Was told to increase to 7mg  daily -could have been 3 months or more ago. INR was elevated (4.3) on 1/11. He held 2 days, dose not decreased. On admission his INR was 3.4. INR now therapeutic after holding one dose. I instructed patient to decrease his dose back to 6.5mg  daily and have his INR recheck in 1 week. Pt verbalized understanding.  Olene Floss, PharmD, BCPS Clinical Pharmacist 08/09/2017 7:18 AM

## 2017-08-10 LAB — HIV ANTIBODY (ROUTINE TESTING W REFLEX): HIV Screen 4th Generation wRfx: NONREACTIVE

## 2017-10-31 DIAGNOSIS — I350 Nonrheumatic aortic (valve) stenosis: Secondary | ICD-10-CM

## 2018-03-16 ENCOUNTER — Emergency Department (HOSPITAL_COMMUNITY): Payer: BLUE CROSS/BLUE SHIELD

## 2018-03-16 ENCOUNTER — Other Ambulatory Visit: Payer: Self-pay

## 2018-03-16 ENCOUNTER — Encounter (HOSPITAL_COMMUNITY): Payer: Self-pay | Admitting: *Deleted

## 2018-03-16 ENCOUNTER — Emergency Department (HOSPITAL_COMMUNITY)
Admission: EM | Admit: 2018-03-16 | Discharge: 2018-03-16 | Disposition: A | Discharge status: Home / Self Care | Payer: BLUE CROSS/BLUE SHIELD | Attending: Emergency Medicine | Admitting: Emergency Medicine

## 2018-03-16 DIAGNOSIS — I10 Essential (primary) hypertension: Secondary | ICD-10-CM | POA: Insufficient documentation

## 2018-03-16 DIAGNOSIS — Z87891 Personal history of nicotine dependence: Secondary | ICD-10-CM | POA: Insufficient documentation

## 2018-03-16 DIAGNOSIS — R4182 Altered mental status, unspecified: Secondary | ICD-10-CM | POA: Insufficient documentation

## 2018-03-16 DIAGNOSIS — I2510 Atherosclerotic heart disease of native coronary artery without angina pectoris: Secondary | ICD-10-CM | POA: Diagnosis not present

## 2018-03-16 DIAGNOSIS — R410 Disorientation, unspecified: Secondary | ICD-10-CM | POA: Diagnosis present

## 2018-03-16 DIAGNOSIS — Z7984 Long term (current) use of oral hypoglycemic drugs: Secondary | ICD-10-CM | POA: Diagnosis not present

## 2018-03-16 DIAGNOSIS — R404 Transient alteration of awareness: Secondary | ICD-10-CM

## 2018-03-16 DIAGNOSIS — E119 Type 2 diabetes mellitus without complications: Secondary | ICD-10-CM | POA: Insufficient documentation

## 2018-03-16 DIAGNOSIS — Z7982 Long term (current) use of aspirin: Secondary | ICD-10-CM | POA: Insufficient documentation

## 2018-03-16 DIAGNOSIS — W19XXXA Unspecified fall, initial encounter: Secondary | ICD-10-CM

## 2018-03-16 DIAGNOSIS — R569 Unspecified convulsions: Secondary | ICD-10-CM

## 2018-03-16 DIAGNOSIS — Z79899 Other long term (current) drug therapy: Secondary | ICD-10-CM | POA: Diagnosis not present

## 2018-03-16 LAB — CBC
HCT: 40.3 % (ref 39.0–52.0)
HEMOGLOBIN: 12.5 g/dL — AB (ref 13.0–17.0)
MCH: 28.7 pg (ref 26.0–34.0)
MCHC: 31 g/dL (ref 30.0–36.0)
MCV: 92.4 fL (ref 80.0–100.0)
Platelets: 220 10*3/uL (ref 150–400)
RBC: 4.36 MIL/uL (ref 4.22–5.81)
RDW: 13.3 % (ref 11.5–15.5)
WBC: 7.9 10*3/uL (ref 4.0–10.5)
nRBC: 1.9 % — ABNORMAL HIGH (ref 0.0–0.2)

## 2018-03-16 LAB — DIFFERENTIAL
ABS IMMATURE GRANULOCYTES: 0.03 10*3/uL (ref 0.00–0.07)
BASOS PCT: 1 %
Basophils Absolute: 0 10*3/uL (ref 0.0–0.1)
Eosinophils Absolute: 0.2 10*3/uL (ref 0.0–0.5)
Eosinophils Relative: 2 %
Immature Granulocytes: 0 %
Lymphocytes Relative: 22 %
Lymphs Abs: 1.7 10*3/uL (ref 0.7–4.0)
Monocytes Absolute: 0.6 10*3/uL (ref 0.1–1.0)
Monocytes Relative: 8 %
NEUTROS ABS: 5.4 10*3/uL (ref 1.7–7.7)
NEUTROS PCT: 67 %

## 2018-03-16 LAB — I-STAT CHEM 8, ED
BUN: 21 mg/dL (ref 8–23)
CALCIUM ION: 1.14 mmol/L — AB (ref 1.15–1.40)
CHLORIDE: 105 mmol/L (ref 98–111)
Creatinine, Ser: 1.2 mg/dL (ref 0.61–1.24)
Glucose, Bld: 166 mg/dL — ABNORMAL HIGH (ref 70–99)
HEMATOCRIT: 39 % (ref 39.0–52.0)
Hemoglobin: 13.3 g/dL (ref 13.0–17.0)
Potassium: 4.2 mmol/L (ref 3.5–5.1)
SODIUM: 139 mmol/L (ref 135–145)
TCO2: 21 mmol/L — ABNORMAL LOW (ref 22–32)

## 2018-03-16 LAB — APTT: APTT: 42 s — AB (ref 24–36)

## 2018-03-16 LAB — COMPREHENSIVE METABOLIC PANEL
ALBUMIN: 3.4 g/dL — AB (ref 3.5–5.0)
ALT: 17 U/L (ref 0–44)
AST: 28 U/L (ref 15–41)
Alkaline Phosphatase: 87 U/L (ref 38–126)
Anion gap: 14 (ref 5–15)
BILIRUBIN TOTAL: 0.7 mg/dL (ref 0.3–1.2)
BUN: 20 mg/dL (ref 8–23)
CO2: 18 mmol/L — ABNORMAL LOW (ref 22–32)
Calcium: 9.2 mg/dL (ref 8.9–10.3)
Chloride: 104 mmol/L (ref 98–111)
Creatinine, Ser: 1.22 mg/dL (ref 0.61–1.24)
GFR calc Af Amer: >60 mL/min (ref >60)
GFR calc non Af Amer: >60 mL/min (ref >60)
GLUCOSE: 166 mg/dL — AB (ref 70–99)
POTASSIUM: 4.2 mmol/L (ref 3.5–5.1)
Sodium: 136 mmol/L (ref 135–145)
TOTAL PROTEIN: 7.2 g/dL (ref 6.5–8.1)

## 2018-03-16 LAB — I-STAT TROPONIN, ED: Troponin i, poc: 0 ng/mL (ref 0.00–0.08)

## 2018-03-16 LAB — PROTIME-INR
INR: 3.02
Prothrombin Time: 31 seconds — ABNORMAL HIGH (ref 11.4–15.2)

## 2018-03-16 LAB — CBG MONITORING, ED: Glucose-Capillary: 163 mg/dL — ABNORMAL HIGH (ref 70–99)

## 2018-03-16 MED ORDER — LEVETIRACETAM 500 MG PO TABS: 500.0000 mg | ORAL_TABLET | Freq: Two times a day (BID) | ORAL | 0 refills | Status: DC

## 2018-03-16 MED ORDER — LEVETIRACETAM 500 MG PO TABS: 500.0000 mg | ORAL_TABLET | Freq: Two times a day (BID) | ORAL | Status: DC

## 2018-03-16 MED ORDER — LEVETIRACETAM IN NACL 1000 MG/100ML IV SOLN
1000.0000 mg | Freq: Once | INTRAVENOUS | Status: AC
  Administered 2018-03-16: 1000 mg via INTRAVENOUS
  Filled 2018-03-16: qty 100

## 2018-03-16 MED ORDER — IOPAMIDOL (ISOVUE-370) INJECTION 76%
50.0000 mL | Freq: Once | INTRAVENOUS | Status: AC | PRN
  Administered 2018-03-16: 50 mL via INTRAVENOUS

## 2018-03-16 NOTE — Discharge Instructions (Signed)
As discussed, today's episode was most likely due to a seizure. It is important to take your newly prescribed medication as directed, and follow-up with our neurologist. In addition, today's evaluation is demonstrated vascular disease which requires ongoing discussion with your physician to ensure that you are taking the appropriate medicine to minimize risk of progression.  Return here for concerning changes in your condition.

## 2018-03-16 NOTE — ED Provider Notes (Signed)
[facility] EMERGENCY DEPARTMENT Provider Note   CSN: [id number] Arrival date & time: 03/16/18  0755     History   Chief Complaint Chief Complaint  Patient presents with  . Code Stroke    HPI Thomas Moore is a 62 y.o. male.  HPI  Patient presents as a code stroke. Patient arrives from home, confused, cannot provide substantial details of the HPI, level 5 caveat secondary to altered mental status. Reportedly the patient became confused in the hours prior to ED alert. The patient himself is confused to the time, date, does not follow commands reliably.  Past Medical History:  Diagnosis Date  . Coronary artery disease   . Diabetes mellitus without complication (HCC)   . History of single vessel coronary artery bypass   . Hypertension     Patient Active Problem List   Diagnosis Date Noted  . Confusion   . Altered mental status 08/08/2017    Past Surgical History:  Procedure Laterality Date  . CORNEAL TRANSPLANT Right         Home Medications    Prior to Admission medications   Medication Sig Start Date End Date Taking? Authorizing Provider  albuterol (PROVENTIL HFA;VENTOLIN HFA) 108 (90 Base) MCG/ACT inhaler Inhale 2 puffs into the lungs every 6 (six) hours as needed for wheezing. 12/11/16  Yes [provider]  aspirin EC 81 MG tablet Take 81 mg by mouth daily.    Yes [provider]  atorvastatin (LIPITOR) 40 MG tablet Take 40 mg by mouth daily.    Yes [provider]  furosemide (LASIX) 80 MG tablet Take 80 mg by mouth daily.    Yes [provider]  gabapentin (NEURONTIN) 300 MG capsule Take 600 mg by mouth 2 (two) times daily.    Yes [provider]  glipiZIDE (GLUCOTROL XL) 10 MG 24 hr tablet Take 20 mg by mouth daily.    Yes [provider]  metFORMIN (GLUCOPHAGE-XR) 500 MG 24 hr tablet Take 2 tablets by mouth 2 (two) times daily. 06/06/17  Yes [provider]  metoprolol  succinate (TOPROL-XL) 50 MG 24 hr tablet Take 1 tablet by mouth daily. 06/06/17  Yes [provider]  Multiple Vitamins tablet Take 1 tablet by mouth daily.    Yes [provider]  nitroGLYCERIN (NITROSTAT) 0.4 MG SL tablet Place 0.4 mg under the tongue every 5 (five) minutes as needed for chest pain. May take up to 3 doses 10/31/17 10/31/18 Yes [provider]  omeprazole (PRILOSEC) 40 MG capsule Take 40 mg by mouth daily.    Yes [provider]  prednisoLONE acetate (PRED FORTE) 1 % ophthalmic suspension Place 1 drop into both eyes 4 (four) times daily.   Yes [provider]  ramipril (ALTACE) 10 MG capsule Take 10 mg by mouth daily.    Yes [provider]  sitaGLIPtin (JANUVIA) 50 MG tablet Take 1 tablet by mouth  daily 01/30/14  Yes [provider]  sulfacetamide (BLEPH-10) 10 % ophthalmic ointment Place 1 drop into the left eye 4 (four) times daily.   Yes [provider]  traMADol-acetaminophen (ULTRACET) 37.5-325 MG tablet Take 1 tablet by mouth every 6 (six) hours as needed for moderate pain.   Yes [provider]  TRULICITY 0.75 MG/0.5ML SOPN Inject 0.75 mg into the skin every 7 (seven) days. 07/27/17  Yes [provider]  warfarin (COUMADIN) 1 MG tablet Take 0.5 mg by mouth daily.  07/31/17  Yes [provider]  warfarin (COUMADIN) 6 MG tablet Take 6 mg by mouth daily.  07/11/17  Yes [provider]    Family History Family History  Problem Relation Age of Onset  . Seizures Brother     Social History Social History   Tobacco Use  . Smoking status: Former Smoker    Types: Cigarettes  . Smokeless tobacco: Never Used  Substance Use Topics  . Alcohol use: No    Alcohol/week: 0.0 standard drinks  . Drug use: No     Allergies   Patient has no known allergies.   Review of Systems Review of Systems  Unable to perform ROS: Mental status change     Physical Exam Updated Vital  Signs BP (!) 179/98   Pulse 84   Resp 18   Wt 79.2 kg   SpO2 97%   BMI 28.18 kg/m   Physical Exam  Constitutional: He appears well-developed. No distress.  HENT:  Head: Normocephalic.  Mouth/Throat:    Eyes: Conjunctivae and EOM are normal.  Cardiovascular: Normal rate and regular rhythm.  Pulmonary/Chest: Effort normal. No stridor. No respiratory distress.  Abdominal: He exhibits no distension.  Musculoskeletal: He exhibits no edema.  Neurological: He is alert. He displays no atrophy and no tremor. No cranial nerve deficit. He exhibits normal muscle tone. He displays no seizure activity. Coordination normal.  Skin: Skin is warm and dry.  Psychiatric: His speech is delayed. He is slowed and withdrawn. Cognition and memory are impaired.  Nursing note and vitals reviewed.    ED Treatments / Results  Labs (all labs ordered are listed, but only abnormal results are displayed) Labs Reviewed  PROTIME-INR - Abnormal; Notable for the following components:      Result Value   Prothrombin Time 31.0 (*)    All other components within normal limits  APTT - Abnormal; Notable for the following components:   aPTT 42 (*)    All other components within normal limits  CBC - Abnormal; Notable for the following components:   Hemoglobin 12.5 (*)    nRBC 1.9 (*)    All other components within normal limits  COMPREHENSIVE METABOLIC PANEL - Abnormal; Notable for the following components:   CO2 18 (*)    Glucose, Bld 166 (*)    Albumin 3.4 (*)    All other components within normal limits  CBG MONITORING, ED - Abnormal; Notable for the following components:   Glucose-Capillary 163 (*)    All other components within normal limits  I-STAT CHEM 8, ED - Abnormal; Notable for the following components:   Glucose, Bld 166 (*)    Calcium, Ion 1.14 (*)    TCO2 21 (*)    All other components within normal limits  DIFFERENTIAL  I-STAT TROPONIN, ED    EKG EKG  Interpretation  Date/Time:  Saturday March 16 2018 08:24:12 EDT Ventricular Rate:  113 PR Interval:    QRS Duration: 87 QT Interval:  300 QTC Calculation: 412 R Axis:   118 Text Interpretation:  Sinus tachycardia ST-t wave abnormality Abnormal ekg Confirmed by Gerhard Munch [phone]) on 03/16/2018 8:30:35 AM Also confirmed by Gerhard Munch ([id number]), editor [place], Tamera Punt ([id number])  on 03/16/2018 10:40:19 AM   Radiology Ct Angio Head W Or Wo Contrast  Result Date: 03/16/2018 CLINICAL DATA:  Code stroke.  Altered mental status.  Found down. EXAM: CT ANGIOGRAPHY HEAD AND NECK TECHNIQUE: Multidetector CT imaging of the head and neck was performed using the standard protocol  during bolus administration of intravenous contrast. Multiplanar CT image reconstructions and MIPs were obtained to evaluate the vascular anatomy. Carotid stenosis measurements (when applicable) are obtained utilizing NASCET criteria, using the distal internal carotid diameter as the denominator. CONTRAST:  50mL ISOVUE-370 IOPAMIDOL (ISOVUE-370) INJECTION 76% COMPARISON:  CT head without contrast of the same day. MRI brain and MRA head 08/08/2017 FINDINGS: CTA NECK FINDINGS Aortic arch: A 3 vessel arch configuration is present. Atherosclerotic changes are present at the origin of the left subclavian artery without significant stenosis. No other significant atherosclerotic changes are present at the arch. Right carotid system: The right common carotid artery is within normal limits. Atherosclerotic changes are present at the right carotid bifurcation. There is no significant stenosis relative to the more distal vessel. Additional mural calcifications are present just below the skull base. This may be related to remote trauma. Left carotid system: The left common carotid artery demonstrates scattered calcifications without significant stenosis. There are more prominent calcifications at the left carotid bifurcation without a  significant stenosis relative to the more distal vessel. The cervical left ICA is otherwise normal. Vertebral arteries: The vertebral arteries originate from the subclavian arteries bilaterally without significant stenosis. The right vertebral artery is dominant to the left. Dense calcifications are present in the distal cervical right vertebral artery just proximal to the dural margin resulting in a high-grade, near occlusive, stenosis. Less prominent calcifications are present in the distal left cervical vertebral artery with decreased caliber of the left V3 and V4 segments. Skeleton: Endplate degenerative change in uncovertebral spurring is greatest on the left at C3-4. Vertebral body heights and alignment are otherwise normal. Other neck: The soft tissues the neck are otherwise unremarkable. Thyroid is within normal limits. Salivary glands are unremarkable. No significant adenopathy is present. Upper chest: Mild ground-glass attenuation is present in the right lung. No significant consolidation, nodule, or mass lesion is present. Thoracic inlet is within normal limits. Review of the MIP images confirms the above findings CTA HEAD FINDINGS Anterior circulation: Dense calcifications are present in the cavernous internal carotid arteries bilaterally. There is a high-grade stenosis at the left ophthalmic segment. Dense calcifications are also present on the right without a significant stenosis relative to the more distal vessel. This results in narrowing of the left ICA terminus. The A1 segments are patent bilaterally. The left A1 segment is not visualized. Right A1 segment is patent. Anterior communicating artery is patent. The M1 segments are normal bilaterally. MCA bifurcations are intact. There is segmental narrowing of MCA branch vessels without a significant proximal occlusion. Distal MCA branch vessel high-grade stenosis is present on the left. Posterior circulation: The left vertebral artery is markedly  narrowed above the dural margin. The distal right vertebral artery is within normal limits. Basilar artery is normal. The right posterior cerebral artery is of fetal type with a small right P1 segment. The left posterior cerebral artery may have been of fetal type. It is now predominantly fed by a small left P1 segment. A diminutive left posterior communicating artery is present. A moderate distal right P2 segment stenosis is present. There is segmental narrowing of distal PCA branch vessels without other significant proximal stenosis or occlusion. Venous sinuses: The dural sinuses are patent. The straight sinus and deep cerebral veins are intact. Cortical veins are unremarkable. Anatomic variants: Fetal type right posterior cerebral artery with small right posterior communicating artery. Review of the MIP images confirms the above findings IMPRESSION: 1. High-grade stenosis of the cavernous and ophthalmic  left internal carotid artery with diminutive terminal ICA. 2. Left M1 segment appears normal with slightly worse distal segmental narrowing of MCA branch vessels compared to the right. 3. Both A2 segments fill from the right. 4. Atherosclerotic calcifications involving the carotid bifurcations bilaterally without significant stenosis. 5. Atherosclerotic disease in the cavernous right internal carotid artery without a significant stenosis. 6. High-grade, near occlusive stenosis of the right vertebral artery at the dural margin. 7. Moderate stenosis of the left vertebral artery at the dural margin. 8. The left posterior cerebral artery and left MCA are likely both dependent on the posterior circulation. Electronically Signed   By: Marin Roberts M.D.   On: 03/16/2018 08:42   Ct Angio Neck W Or Wo Contrast  Result Date: 03/16/2018 CLINICAL DATA:  Code stroke.  Altered mental status.  Found down. EXAM: CT ANGIOGRAPHY HEAD AND NECK TECHNIQUE: Multidetector CT imaging of the head and neck was performed using  the standard protocol during bolus administration of intravenous contrast. Multiplanar CT image reconstructions and MIPs were obtained to evaluate the vascular anatomy. Carotid stenosis measurements (when applicable) are obtained utilizing NASCET criteria, using the distal internal carotid diameter as the denominator. CONTRAST:  50mL ISOVUE-370 IOPAMIDOL (ISOVUE-370) INJECTION 76% COMPARISON:  CT head without contrast of the same day. MRI brain and MRA head 08/08/2017 FINDINGS: CTA NECK FINDINGS Aortic arch: A 3 vessel arch configuration is present. Atherosclerotic changes are present at the origin of the left subclavian artery without significant stenosis. No other significant atherosclerotic changes are present at the arch. Right carotid system: The right common carotid artery is within normal limits. Atherosclerotic changes are present at the right carotid bifurcation. There is no significant stenosis relative to the more distal vessel. Additional mural calcifications are present just below the skull base. This may be related to remote trauma. Left carotid system: The left common carotid artery demonstrates scattered calcifications without significant stenosis. There are more prominent calcifications at the left carotid bifurcation without a significant stenosis relative to the more distal vessel. The cervical left ICA is otherwise normal. Vertebral arteries: The vertebral arteries originate from the subclavian arteries bilaterally without significant stenosis. The right vertebral artery is dominant to the left. Dense calcifications are present in the distal cervical right vertebral artery just proximal to the dural margin resulting in a high-grade, near occlusive, stenosis. Less prominent calcifications are present in the distal left cervical vertebral artery with decreased caliber of the left V3 and V4 segments. Skeleton: Endplate degenerative change in uncovertebral spurring is greatest on the left at C3-4.  Vertebral body heights and alignment are otherwise normal. Other neck: The soft tissues the neck are otherwise unremarkable. Thyroid is within normal limits. Salivary glands are unremarkable. No significant adenopathy is present. Upper chest: Mild ground-glass attenuation is present in the right lung. No significant consolidation, nodule, or mass lesion is present. Thoracic inlet is within normal limits. Review of the MIP images confirms the above findings CTA HEAD FINDINGS Anterior circulation: Dense calcifications are present in the cavernous internal carotid arteries bilaterally. There is a high-grade stenosis at the left ophthalmic segment. Dense calcifications are also present on the right without a significant stenosis relative to the more distal vessel. This results in narrowing of the left ICA terminus. The A1 segments are patent bilaterally. The left A1 segment is not visualized. Right A1 segment is patent. Anterior communicating artery is patent. The M1 segments are normal bilaterally. MCA bifurcations are intact. There is segmental narrowing of MCA branch vessels  without a significant proximal occlusion. Distal MCA branch vessel high-grade stenosis is present on the left. Posterior circulation: The left vertebral artery is markedly narrowed above the dural margin. The distal right vertebral artery is within normal limits. Basilar artery is normal. The right posterior cerebral artery is of fetal type with a small right P1 segment. The left posterior cerebral artery may have been of fetal type. It is now predominantly fed by a small left P1 segment. A diminutive left posterior communicating artery is present. A moderate distal right P2 segment stenosis is present. There is segmental narrowing of distal PCA branch vessels without other significant proximal stenosis or occlusion. Venous sinuses: The dural sinuses are patent. The straight sinus and deep cerebral veins are intact. Cortical veins are  unremarkable. Anatomic variants: Fetal type right posterior cerebral artery with small right posterior communicating artery. Review of the MIP images confirms the above findings IMPRESSION: 1. High-grade stenosis of the cavernous and ophthalmic left internal carotid artery with diminutive terminal ICA. 2. Left M1 segment appears normal with slightly worse distal segmental narrowing of MCA branch vessels compared to the right. 3. Both A2 segments fill from the right. 4. Atherosclerotic calcifications involving the carotid bifurcations bilaterally without significant stenosis. 5. Atherosclerotic disease in the cavernous right internal carotid artery without a significant stenosis. 6. High-grade, near occlusive stenosis of the right vertebral artery at the dural margin. 7. Moderate stenosis of the left vertebral artery at the dural margin. 8. The left posterior cerebral artery and left MCA are likely both dependent on the posterior circulation. Electronically Signed   By: Marin Roberts M.D.   On: 03/16/2018 08:42   Ct C-spine No Charge  Result Date: 03/16/2018 CLINICAL DATA:  Found down.  Fall. EXAM: CT CERVICAL SPINE WITHOUT CONTRAST TECHNIQUE: Multidetector CT imaging of the cervical spine was performed without intravenous contrast. Multiplanar CT image reconstructions were also generated. COMPARISON:  None. FINDINGS: Alignment: AP alignment is anatomic. Skull base and vertebrae: Craniocervical junction is normal. Vertebral body heights are maintained. No acute or healing fractures are present. Soft tissues and spinal canal: Soft tissues the neck are within normal limits. Spinal canal is grossly patent. Disc levels: Disc osteophyte and uncovertebral disease is worse on the left at C3-4 and on the right C4-5. Upper chest: The lung apices are clear. IMPRESSION: 1. Degenerative changes of the cervical spine are most pronounced at C3-4 and C4-5. 2. No acute abnormalities. Electronically Signed   By:  Marin Roberts M.D.   On: 03/16/2018 08:46   Ct Head Code Stroke Wo Contrast  Result Date: 03/16/2018 CLINICAL DATA:  Code stroke. Found down. Altered mental status. Last seen normal at 6:30 a.m. EXAM: CT HEAD WITHOUT CONTRAST TECHNIQUE: Contiguous axial images were obtained from the base of the skull through the vertex without intravenous contrast. COMPARISON:  MRI brain 08/08/2017 FINDINGS: Brain: Mild atrophy and white matter disease is stable. Basal ganglia are intact. Periventricular changes into the corona radiata are stable. A punctate lacunar infarct of the right thalamus is new since the prior study. Insular ribbon is normal. Remote encephalomalacia of the anterior left frontal lobe is stable. The brainstem and cerebellum are normal. Ventricles are of normal size. No significant extra-axial fluid collection is present. Vascular: Atherosclerotic calcifications are present in the cavernous internal carotid arteries and at the dural margin of the right vertebral artery. There is no hyperdense vessel. Skull: Calvarium is unremarkable. No focal lytic or blastic lesions are present. No significant extracranial soft tissue  lesions are present. Sinuses/Orbits: The paranasal sinuses and mastoid air cells are clear. Globes and orbits are within normal limits. ASPECTS [facility] Stroke Program Early CT Score) - Ganglionic level infarction (caudate, lentiform nuclei, internal capsule, insula, M1-M3 cortex): 7/7 - Supraganglionic infarction (M4-M6 cortex): 3/3 Total score (0-10 with 10 being normal): 10/10 IMPRESSION: 1. Age indeterminate punctate nonhemorrhagic infarct of the right thalamus. 2. Otherwise stable atrophy and white matter disease. This is somewhat advanced for age and likely reflects the sequela of chronic microvascular ischemia. 3. Stable remote encephalomalacia of the anterior left frontal lobe. 4. ASPECTS is 10/10 The above was relayed via text pager to Dr. Wilford Corner on 03/16/2018 at 08:12 .  Electronically Signed   By: Marin Roberts M.D.   On: 03/16/2018 08:13    Procedures Procedures (including critical care time)  Medications Ordered in ED Medications  levETIRAcetam (KEPPRA) tablet 500 mg (has no administration in time range)  levETIRAcetam (KEPPRA) IVPB 1000 mg/100 mL premix (0 mg Intravenous Stopped 03/16/18 1032)  iopamidol (ISOVUE-370) 76 % injection 50 mL (50 mLs Intravenous Contrast Given 03/16/18 0827)     Initial Impression / Assessment and Plan / ED Course  I have reviewed the triage vital signs and the nursing notes.  Pertinent labs & imaging results that were available during my care of the patient were reviewed by me and considered in my medical decision making (see chart for details).    Patient arrived as a code stroke, had emergent head CT, and evaluation with our neurology colleagues per During CT, chart review was done, notable for evaluation 7 months ago, similar circumstances, with diagnosis of acute encephalopathy, concern for possible seizure Patient had admission, with generally reassuring findings, did not start antiepileptics.  Update: Head CT not notable for acute new findings. Some suspicion for seizure activity given the patient's tongue laceration, persistent mild confusion.  Date: Patient awake, alert, remains disoriented, moving all extremities without discernible strength deficiency. CT, again without acute finding, with evidence for possible old stroke.  9:30 AM Patient more awake, minimal confusion, Keppra loading dose has not started.  10:47 AM Awake, alert, speaking in a normal fashion, now accompanied by his girlfriend who corroborates that the patient is interacting in a typical manner. No complaints of weakness, patient has been ambulatory without antalgic gait or difficulty. All findings again reviewed with the patient, his girlfriend, and our neurology colleagues. No evidence for new mass, bleed, or obvious  occlusion. Given the patient's history of prior similar event, today's episode, there is suspicion for seizure more likely than stroke, MRI not currently indicated. She also has substantial vascular disease, which will require follow-up as an outpatient. She has tolerated IV Keppra loading dose, has had no additional seizure activity, has returned to baseline neurologic condition, and is appropriate for discharge with close outpatient follow-up.   Final Clinical Impressions(s) / ED Diagnoses  Altered mental status   Gerhard Munch, MD 03/16/18 1049

## 2018-03-16 NOTE — ED Triage Notes (Signed)
EMS called to PT Apt. Found Pt on the floor  . Pt confused and did not know he had a seizure. On arrival Pt unsure of birthdate and month. Pt bit tongue during seizure. Pt is diabetic and past HX of seizure but no meds for seizure. CBG 165

## 2018-03-16 NOTE — Consult Note (Addendum)
Neurology Consultation  Reason for Consult: Code Stroke with seizure suspicion  Referring Physician: Dr. Cathleen Fears   CC: Seizure   History is obtained from: Patient, EMS, record review  HPI: Thomas Moore is a 62 y.o. male with a PMH of stroke (R punctate thalamic) CAD (on ASA) , PE( on coumadin), DM presents today to the ER after being found down by his girlfriend with seizure like activity. LKW was about 0630 and the episode  was noted about 0715. The time of the event is unknown as well as the description of the patient during the event. He is present with dry blood in his mouth and evidence of a tongue bite. He was not known to lose control of his bowel or bladder. He did had transient Right arm weakness.  EMS was activated and states that when they arrived he was observed to be confused and resistant to care. He was unable to answer questions appropriately such as time, date, year, or place and he was unable to identify his significant other per EMS. His blood glucose in the field was 125, and his blood pressure158/89. He has no seizure history but was seen on a previous admission in March of this years at [facility] for a similar presentation, and was suspicious at that time for seizure. There was not enough evidence at that time for seizure and he was not placed on AED therapy. He remains slightly confused on arrival to the ER, but is awake enough to answer questions. His right arm weakness has resolved and was likely todd's paralysis. As this is very much characteristic of a seizure/ not a stroke, hence the reasoning that he is not given TPA. He is scored 2 on NIHSS for LOC questions. CT brain was negative for acute bleed, or stroke- but revealed age indeterminate punctate nonhemorrhagic infarct of the right thalamus stable atrophy and white matter disease remote encephalomalacia of the anterior left frontal lobe. CTA preliminary was unremarkable, and Cspine was completed because he was found down t/o  injury. Seizure diagnosis is high suspicion so he is ordered 1g Keppra IV, then order 500 mg po BID moving forward.   LKW: 0630 tpa given?: no, likely seizure- previous similar  Premorbid modified Rankin scale (mRS): 1  ROS: ROS was performed and is negative except as noted in the HPI.   Past Medical History:  Diagnosis Date  . Coronary artery disease   . Diabetes mellitus without complication (HCC)   . History of single vessel coronary artery bypass   . Hypertension    CAD, DM, CABG hx, HTN   Family History  Problem Relation Age of Onset  . Seizures Brother    Social History:   reports that he has quit smoking. His smoking use included cigarettes. He has never used smokeless tobacco. He reports that he does not drink alcohol or use drugs.  Medications No current facility-administered medications for this encounter.   Current Outpatient Medications:  .  aspirin EC 81 MG tablet, Take 81 mg by mouth., Disp: , Rfl:  .  atorvastatin (LIPITOR) 40 MG tablet, Take 40 mg by mouth daily. , Disp: , Rfl:  .  furosemide (LASIX) 80 MG tablet, Take 80 mg by mouth., Disp: , Rfl:  .  gabapentin (NEURONTIN) 300 MG capsule, Take 600 mg by mouth., Disp: , Rfl:  .  glipiZIDE (GLUCOTROL XL) 10 MG 24 hr tablet, Take 20 mg by mouth daily. , Disp: , Rfl:  .  metFORMIN (GLUCOPHAGE-XR) 500  MG 24 hr tablet, Take 2 tablets by mouth 2 (two) times daily., Disp: , Rfl:  .  metoprolol succinate (TOPROL-XL) 50 MG 24 hr tablet, Take 1 tablet by mouth daily., Disp: , Rfl:  .  Multiple Vitamins tablet, Take by mouth., Disp: , Rfl:  .  omeprazole (PRILOSEC) 40 MG capsule, Take 40 mg by mouth daily. , Disp: , Rfl:  .  ramipril (ALTACE) 10 MG capsule, Take 10 mg by mouth daily. , Disp: , Rfl:  .  sitaGLIPtin (JANUVIA) 50 MG tablet, Take 1 tablet by mouth  daily, Disp: , Rfl:  .  TRULICITY 0.75 MG/0.5ML SOPN, Inject 0.75 mg into the skin every 7 (seven) days., Disp: , Rfl:  .  warfarin (COUMADIN) 1 MG tablet, ,  Disp: , Rfl:  .  warfarin (COUMADIN) 6 MG tablet, , Disp: , Rfl:   Exam: Current vital signs: BP (!) 152/85   Pulse (!) 107   Resp 18   Wt 79.2 kg   SpO2 95%   BMI 28.18 kg/m  Vital signs in last 24 hours: Pulse Rate:  [107] 107 (10/12 0943) Resp:  [18] 18 (10/12 0943) BP: (152)/(85) 152/85 (10/12 0943) SpO2:  [95 %] 95 % (10/12 0943) Weight:  [79.2 kg] 79.2 kg (10/12 0837)  GENERAL: Awake, alert in NAD HEENT: - Normocephalic and atraumatic, dry mm, no LN++, no Thyromegally + tongue bite LUNGS - Clear to auscultation bilaterally with no wheezes CV - S1S2 RRR, no m/r/g, equal pulses bilaterally. ABDOMEN - Soft, nontender, nondistended with normoactive BS Ext: warm, well perfused, intact peripheral pulses, no edema  NEURO:  Mental Status: Alert orient to self and place only on initial encounter.  On reexamining at 1030, alert awake oriented x3. Language: speech is without aphasia or dysarthria. He remains mildly confused  Cranial Nerves: PERRL EOMI, visual fields full, no facial asymmetry,facial sensation intact, hearing intact, tongue with left to midline laceration, normal sternocleidomastoid and trapezius muscle strength Motor: 5/5 in all ect's  Tone: is normal and bulk is normal Sensation- Intact to light touch bilaterally Coordination: no ataxia identified  Gait- deferred NIHSS at presentation-2(LOC questions)   Labs I have reviewed labs in epic and the results pertinent to this consultation are: Mild hyperglycemia with glucose 166, AST 56 mildly elevated.  All other labs on CBC BMP normal. CBC    Component Value Date/Time   WBC 7.9 03/16/2018 0759   RBC 4.36 03/16/2018 0759   HGB 13.3 03/16/2018 0802   HCT 39.0 03/16/2018 0802   PLT 220 03/16/2018 0759   MCV 92.4 03/16/2018 0759   MCH 28.7 03/16/2018 0759   MCHC 31.0 03/16/2018 0759   RDW 13.3 03/16/2018 0759   LYMPHSABS 1.7 03/16/2018 0759   MONOABS 0.6 03/16/2018 0759   EOSABS 0.2 03/16/2018 0759    BASOSABS 0.0 03/16/2018 0759    CMP     Component Value Date/Time   NA 139 03/16/2018 0802   K 4.2 03/16/2018 0802   CL 105 03/16/2018 0802   CO2 19 (L) 08/08/2017 1032   GLUCOSE 166 (H) 03/16/2018 0802   BUN 21 03/16/2018 0802   CREATININE 1.20 03/16/2018 0802   CALCIUM 8.9 08/08/2017 1032   PROT 7.8 08/08/2017 1032   ALBUMIN 3.8 08/08/2017 1032   AST 56 (H) 08/08/2017 1032   ALT 52 08/08/2017 1032   ALKPHOS 88 08/08/2017 1032   BILITOT 0.5 08/08/2017 1032   GFRNONAA >60 08/08/2017 1032   GFRAA >60 08/08/2017 1032   Imaging  I have reviewed the images obtained: CT-scan of the brain w/o 1. Age indeterminate punctate nonhemorrhagic infarct of the right thalamus. 2. Otherwise stable atrophy and white matter disease. This is somewhat advanced for age and likely reflects the sequela of chronic microvascular ischemia. 3. Stable remote encephalomalacia of the anterior left frontal lobe. 4. ASPECTS is 10/10  CTA head and neck High-grade stenosis in the cavernous and ophthalmic left ICA, diminutive terminal ICA, left M1 segment appears normal with slightly worse distal segment narrowing of MCA branches compared to right, both A2 segments filling from the right, atherosclerotic disease and calcifications involving carotid bifurcations, cavernous right ICA.  High-grade/nearly occlusive stenosis of the right vert at the dural margin.  Moderate stenosis of the left verted the dural margin.  Left posterior cerebral artery and left MCA likely both dependent on posterior circulation.  Assessment:   Thomas Moore is a 62 y.o. male with a PMH of stroke left frontal with unknown residual deficits, CAD (on ASA) , PE( on coumadin), DM presents today to the ER as a code stroke after being found down by his girlfriend with seizure like activity He had a presentation to [facility] in March 2019 with concerns for seizure, but MRI and EEG were normal and history was not 100% clear for  seizure, hence not started on antiepileptics at that time. Today he presented with some postictal confusion, transient abnormal speech and aphasia which had all resolve in an hour after return to his baseline. Noncontrast CT of the head shows a left frontal area of encephalomalacia likely from a prior stroke, which could have acted as a seizure focus. He does have a lot of high-grade stenoses in his craniocervical circulation, but he is adequately treated with anticoagulation and antiplatelet at this time-Coumadin and aspirin and has no indication for any other emergent intervention regarding that. CT of the C-spine was performed to evaluate for any injury since he was found down on the floor by EMS.-It was unremarkable for acute injury.  Impression: Seizure with ensuing right-sided Todd's paralysis-likely emanating from the left frontal area of encephalomalacia.  Recommendations: #Loaded with 1g Keppra IV, then order 500 mg po BID moving forward.   #Initially I had verbally recommended to the ED provider that patient be sent for an MRI but he quickly returned to his baseline making seizure the most likely etiology.  #This being the second event, there is no doubt in my mind that he should be on antiepileptic.  I do not see further need for pursuing any inpatient work-up at this time.  #His labs are unremarkable.  He has returned back to his normal baseline.  If he is able to walk and cleared by emergency department for discharge, he can be discharged home and can follow-up with outpatient neurology in the clinic.  #MRI brain and EEG can both be performed as an outpatient per outpatient neurology.  I have relayed my plan to both the patient and the ED provider Dr. Jeraldine Loots. -------------------------------------------------------------------------------------------------------------------------------------------------------------------------- Seizure precautions as below. Per [facility]  statutes, patients with seizures are not allowed to drive until they have been seizure-free for six months.  Use caution when using heavy equipment or power tools. Avoid working on ladders or at heights. Take showers instead of baths. Ensure the water temperature is not too high on the home water heater. Do not go swimming alone. Do not lock yourself in a room alone (i.e. bathroom). When caring for infants or small children, sit  down when holding, feeding, or changing them to minimize risk of injury to the child in the event you have a seizure. Maintain good sleep hygiene. Avoid alcohol.  If patient has another seizure, call 911 and bring them back to the ED if: A. The seizure lasts longer than 5 minutes.  B. The patient doesn't wake shortly after the seizure or has new problems such as difficulty seeing, speaking or moving following the seizure C. The patient was injured during the seizure D. The patient has a temperature over 102 F (39C) E. The patient vomited during the seizure and now is having trouble breathing    Attending Neurohospitalist Addendum Patient seen and examined with APP/Resident. Agree with the history and physical as documented above. Agree with the plan as documented, which I helped formulate and I have made the edits in the note and assessment and plan when needed. I have independently reviewed the chart, obtained history, review of systems and examined the patient.I have personally reviewed pertinent head/neck/spine imaging (CT/MRI). Please feel free to call with any questions. --- Milon Dikes, MD [facility] Pager: [phone]  If 7pm to 7am, please call on call as listed on AMION.    -- Milon Dikes, MD [facility] Pager: [phone] If 7pm to 7am, please call on call as listed on AMION.

## 2018-03-16 NOTE — ED Notes (Signed)
Pt ambulated well. No complaint of dizziness or pain. Pt placed back in bed in position of comfort. Bed in lowest position and call light within reach.

## 2018-03-18 ENCOUNTER — Encounter (HOSPITAL_COMMUNITY): Payer: Self-pay | Admitting: Neurology

## 2018-03-18 ENCOUNTER — Emergency Department (HOSPITAL_COMMUNITY): Payer: BLUE CROSS/BLUE SHIELD

## 2018-03-18 ENCOUNTER — Emergency Department (HOSPITAL_COMMUNITY)
Admission: EM | Admit: 2018-03-18 | Discharge: 2018-03-18 | Disposition: A | Discharge status: Home / Self Care | Payer: BLUE CROSS/BLUE SHIELD | Attending: Emergency Medicine | Admitting: Emergency Medicine

## 2018-03-18 DIAGNOSIS — E119 Type 2 diabetes mellitus without complications: Secondary | ICD-10-CM | POA: Insufficient documentation

## 2018-03-18 DIAGNOSIS — R4182 Altered mental status, unspecified: Secondary | ICD-10-CM | POA: Diagnosis present

## 2018-03-18 DIAGNOSIS — Z7984 Long term (current) use of oral hypoglycemic drugs: Secondary | ICD-10-CM | POA: Diagnosis not present

## 2018-03-18 DIAGNOSIS — Z7982 Long term (current) use of aspirin: Secondary | ICD-10-CM | POA: Diagnosis not present

## 2018-03-18 DIAGNOSIS — Z7901 Long term (current) use of anticoagulants: Secondary | ICD-10-CM | POA: Diagnosis not present

## 2018-03-18 DIAGNOSIS — R454 Irritability and anger: Secondary | ICD-10-CM

## 2018-03-18 DIAGNOSIS — I10 Essential (primary) hypertension: Secondary | ICD-10-CM | POA: Diagnosis not present

## 2018-03-18 DIAGNOSIS — R41844 Frontal lobe and executive function deficit: Secondary | ICD-10-CM | POA: Diagnosis not present

## 2018-03-18 DIAGNOSIS — Z79899 Other long term (current) drug therapy: Secondary | ICD-10-CM | POA: Diagnosis not present

## 2018-03-18 DIAGNOSIS — T887XXA Unspecified adverse effect of drug or medicament, initial encounter: Secondary | ICD-10-CM

## 2018-03-18 DIAGNOSIS — Z87891 Personal history of nicotine dependence: Secondary | ICD-10-CM | POA: Diagnosis not present

## 2018-03-18 DIAGNOSIS — I69314 Frontal lobe and executive function deficit following cerebral infarction: Secondary | ICD-10-CM

## 2018-03-18 DIAGNOSIS — T426X5A Adverse effect of other antiepileptic and sedative-hypnotic drugs, initial encounter: Secondary | ICD-10-CM | POA: Insufficient documentation

## 2018-03-18 LAB — CBC
HEMATOCRIT: 38.1 % — AB (ref 39.0–52.0)
Hemoglobin: 12 g/dL — ABNORMAL LOW (ref 13.0–17.0)
MCH: 28.6 pg (ref 26.0–34.0)
MCHC: 31.5 g/dL (ref 30.0–36.0)
MCV: 90.9 fL (ref 80.0–100.0)
Platelets: 234 10*3/uL (ref 150–400)
RBC: 4.19 MIL/uL — ABNORMAL LOW (ref 4.22–5.81)
RDW: 13.4 % (ref 11.5–15.5)
WBC: 6.5 10*3/uL (ref 4.0–10.5)
nRBC: 0 % (ref 0.0–0.2)

## 2018-03-18 LAB — COMPREHENSIVE METABOLIC PANEL
ALBUMIN: 3.4 g/dL — AB (ref 3.5–5.0)
ALT: 17 U/L (ref 0–44)
AST: 25 U/L (ref 15–41)
Alkaline Phosphatase: 81 U/L (ref 38–126)
Anion gap: 9 (ref 5–15)
BUN: 16 mg/dL (ref 8–23)
CHLORIDE: 103 mmol/L (ref 98–111)
CO2: 26 mmol/L (ref 22–32)
CREATININE: 1.01 mg/dL (ref 0.61–1.24)
Calcium: 9.2 mg/dL (ref 8.9–10.3)
GFR calc Af Amer: >60 mL/min (ref >60)
GFR calc non Af Amer: >60 mL/min (ref >60)
GLUCOSE: 183 mg/dL — AB (ref 70–99)
POTASSIUM: 4.4 mmol/L (ref 3.5–5.1)
Sodium: 138 mmol/L (ref 135–145)
Total Bilirubin: 0.5 mg/dL (ref 0.3–1.2)
Total Protein: 7.8 g/dL (ref 6.5–8.1)

## 2018-03-18 LAB — CBG MONITORING, ED: Glucose-Capillary: 151 mg/dL — ABNORMAL HIGH (ref 70–99)

## 2018-03-18 LAB — PROTIME-INR
INR: 3.39
Prothrombin Time: 34 seconds — ABNORMAL HIGH (ref 11.4–15.2)

## 2018-03-18 MED ORDER — DIVALPROEX SODIUM 125 MG PO DR TAB: 400.0000 mg | DELAYED_RELEASE_TABLET | Freq: Three times a day (TID) | ORAL | 0 refills | Status: DC

## 2018-03-18 MED ORDER — VALPROATE SODIUM 500 MG/5ML IV SOLN
20.0000 mg/kg | Freq: Once | INTRAVENOUS | Status: AC
  Administered 2018-03-18: 1584 mg via INTRAVENOUS
  Filled 2018-03-18: qty 15.84

## 2018-03-18 NOTE — ED Triage Notes (Signed)
Pt  Family Member at desk reporting if He does not get results  Soon we are going to walk out.

## 2018-03-18 NOTE — ED Triage Notes (Signed)
Pt complaining about food. Pt offered happy meal and Pt refused happy meal. Pt's friend in room was given a Happy meal.

## 2018-03-18 NOTE — ED Triage Notes (Signed)
Pt here with a friend who reports pt has been acting right since Saturday morning since he had new onset seizure. He has been acting irritable, having trouble remembering. Was started on kepra after the incident. Pt only c/o lower back pain from when he fell after the seizure. Pt is a x 4, equal grips, no weakness. Ambulatory in triage.

## 2018-03-18 NOTE — ED Provider Notes (Signed)
[facility] EMERGENCY DEPARTMENT Provider Note   CSN: [id number] Arrival date & time: 03/18/18  0102     History   Chief Complaint Chief Complaint  Patient presents with  . Altered Mental Status    HPI Thomas Moore is a 62 y.o. male.  HPI   Pt reports he is having back pain since his fall on Saturday, he thought everything else was ok  Yesterday didn't remember his pin number, has been more irritable, yesterday he didn't seem like himself Not sure if having effect of keppra he just started Acting strange, just not acting like him old self, getting on friend for bringing him in Irritable    Past Medical History:  Diagnosis Date  . Coronary artery disease   . Diabetes mellitus without complication (HCC)   . History of single vessel coronary artery bypass   . Hypertension     Patient Active Problem List   Diagnosis Date Noted  . Confusion   . Altered mental status 08/08/2017    Past Surgical History:  Procedure Laterality Date  . CORNEAL TRANSPLANT Right         Home Medications    Prior to Admission medications   Medication Sig Start Date End Date Taking? Authorizing Provider  albuterol (PROVENTIL HFA;VENTOLIN HFA) 108 (90 Base) MCG/ACT inhaler Inhale 2 puffs into the lungs every 6 (six) hours as needed for wheezing. 12/11/16  Yes [provider]  aspirin EC 81 MG tablet Take 81 mg by mouth daily.    Yes [provider]  atorvastatin (LIPITOR) 40 MG tablet Take 40 mg by mouth daily.    Yes [provider]  gabapentin (NEURONTIN) 300 MG capsule Take 600 mg by mouth 2 (two) times daily.    Yes [provider]  glipiZIDE (GLUCOTROL XL) 10 MG 24 hr tablet Take 10 mg by mouth 2 (two) times daily.    Yes [provider]  metFORMIN (GLUCOPHAGE-XR) 500 MG 24 hr tablet Take 1,000 mg by mouth 2 (two) times daily.  06/06/17  Yes [provider]  metoprolol succinate (TOPROL-XL) 100 MG 24 hr tablet  Take 1 tablet by mouth daily.  06/06/17  Yes [provider]  Multiple Vitamins tablet Take 1 tablet by mouth daily.    Yes [provider]  nitroGLYCERIN (NITROSTAT) 0.4 MG SL tablet Place 0.4 mg under the tongue every 5 (five) minutes as needed for chest pain. May take up to 3 doses 10/31/17 10/31/18 Yes [provider]  omeprazole (PRILOSEC) 40 MG capsule Take 40 mg by mouth daily.    Yes [provider]  ramipril (ALTACE) 10 MG capsule Take 10 mg by mouth daily.    Yes [provider]  traMADol-acetaminophen (ULTRACET) 37.5-325 MG tablet Take 1 tablet by mouth every 6 (six) hours as needed for moderate pain.   Yes [provider]  TRULICITY 0.75 MG/0.5ML SOPN Inject 0.75 mg into the skin every 7 (seven) days. 07/27/17  Yes [provider]  warfarin (COUMADIN) 1 MG tablet Take 0.5 mg by mouth daily.  07/31/17  Yes [provider]  warfarin (COUMADIN) 6 MG tablet Take 6 mg by mouth daily.  07/11/17  Yes [provider]  divalproex (DEPAKOTE) 125 MG DR tablet Take 3 tablets (375 mg total) by mouth 3 (three) times daily. 03/18/18 04/17/18  Alvira Monday, MD  furosemide (LASIX) 80 MG tablet Take 80 mg by mouth daily.     [provider]  sitaGLIPtin (JANUVIA) 50 MG tablet Take 1 tablet by mouth  daily 01/30/14   [provider]    Family History Family History  Problem Relation Age of Onset  . Seizures Brother     Social History Social History   Tobacco Use  . Smoking status: Former Smoker    Types: Cigarettes  . Smokeless tobacco: Never Used  Substance Use Topics  . Alcohol use: No    Alcohol/week: 0.0 standard drinks  . Drug use: No     Allergies   Patient has no known allergies.   Review of Systems Review of Systems  Constitutional: Negative for fever.  HENT: Negative for sore throat.   Eyes: Negative for visual disturbance.  Respiratory: Negative for shortness of breath.     Cardiovascular: Negative for chest pain.  Gastrointestinal: Negative for abdominal pain, diarrhea, nausea and vomiting.  Genitourinary: Negative for difficulty urinating and dysuria.  Musculoskeletal: Positive for back pain (right flank contusion). Negative for gait problem and neck stiffness.  Skin: Negative for rash.  Neurological: Negative for syncope, speech difficulty, weakness, numbness and headaches.  Psychiatric/Behavioral: Positive for confusion.     Physical Exam Updated Vital Signs BP (!) 171/80 (BP Location: Right Arm)   Pulse 69   Temp 98.8 F (37.1 C) (Oral)   Resp 18   SpO2 99%   Physical Exam  Constitutional: He is oriented to person, place, and time. He appears well-developed and well-nourished. No distress.  HENT:  Head: Normocephalic and atraumatic.  Eyes: Conjunctivae and EOM are normal.  Neck: Normal range of motion.  Cardiovascular: Normal rate, regular rhythm, normal heart sounds and intact distal pulses. Exam reveals no gallop and no friction rub.  No murmur heard. Pulmonary/Chest: Effort normal and breath sounds normal. No respiratory distress. He has no wheezes. He has no rales.  Abdominal: Soft. He exhibits no distension. There is no tenderness. There is no guarding.  Musculoskeletal: He exhibits no edema.       Lumbar back: He exhibits tenderness (contusion) and bony tenderness.  Neurological: He is alert and oriented to person, place, and time. He has normal strength. No cranial nerve deficit or sensory deficit. GCS eye subscore is 4. GCS verbal subscore is 5. GCS motor subscore is 6.  Skin: Skin is warm and dry. He is not diaphoretic.  Nursing note and vitals reviewed.    ED Treatments / Results  Labs (all labs ordered are listed, but only abnormal results are displayed) Labs Reviewed  COMPREHENSIVE METABOLIC PANEL - Abnormal; Notable for the following components:      Result Value   Glucose, Bld 183 (*)    Albumin 3.4 (*)    All other  components within normal limits  CBC - Abnormal; Notable for the following components:   RBC 4.19 (*)    Hemoglobin 12.0 (*)    HCT 38.1 (*)    All other components within normal limits  PROTIME-INR - Abnormal; Notable for the following components:   Prothrombin Time 34.0 (*)    All other components within normal limits  CBG MONITORING, ED - Abnormal; Notable for the following components:   Glucose-Capillary 151 (*)    All other components within normal limits    EKG None  Radiology Dg Lumbar Spine Complete  Result Date: 03/18/2018 CLINICAL DATA:  Lower back pain after fall. EXAM: LUMBAR SPINE - COMPLETE 4+ VIEW COMPARISON:  None available currently. FINDINGS: No fracture or spondylolisthesis is noted. Severe degenerative disc disease is noted at  L3-4 with anterior osteophyte formation. Mild degenerative disc disease is noted at L2-3 with anterior osteophyte formation. Atherosclerosis of abdominal aorta is noted. IMPRESSION: Multilevel degenerative disc disease. No acute abnormality seen in the lumbar spine. Aortic Atherosclerosis (ICD10-I70.0). Electronically Signed   By: Lupita Raider, M.D.   On: 03/18/2018 13:19   Mr Brain Wo Contrast  Result Date: 03/18/2018 CLINICAL DATA:  Altered mental status.  New onset seizure. EXAM: MRI HEAD WITHOUT CONTRAST TECHNIQUE: Multiplanar, multiecho pulse sequences of the brain and surrounding structures were obtained without intravenous contrast. COMPARISON:  CT head 03/16/2018 FINDINGS: Brain: Mild atrophy. Negative for hydrocephalus. Negative for acute infarct. Chronic hemorrhagic infarct left frontal lobe. Chronic microvascular ischemic changes in the white matter. Chronic infarct in the right pons. Vascular: Normal arterial flow voids Skull and upper cervical spine: Negative Sinuses/Orbits: Negative Other: None IMPRESSION: Negative for acute infarct. Chronic hemorrhagic infarct in the left frontal lobe. Chronic microvascular ischemic change in the  white matter. Electronically Signed   By: Marlan Palau M.D.   On: 03/18/2018 15:31    Procedures Procedures (including critical care time)  Medications Ordered in ED Medications  valproate (DEPACON) 1,584 mg in dextrose 5 % 50 mL IVPB (0 mg/kg  79.2 kg Intravenous Stopped 03/18/18 1606)     Initial Impression / Assessment and Plan / ED Course  I have reviewed the triage vital signs and the nursing notes.  Pertinent labs & imaging results that were available during my care of the patient were reviewed by me and considered in my medical decision making (see chart for details).     62yo male with history above presents with concern for irritability after having suspected seizure last Saturday and being started on Keppra. At time of presentation on Saturday, they had discussed possible MRI, and today friend comes with patient wondering if MRI should be performed. Hx of prior frontal CVA per MRI in March.  Repeated MRI to evaluate for signs of new CVA and it shows no evidence of new stroke but does show chronic hemorrhagic infarct of left frontal lobe. Discussed with Dr. Otelia Limes of neurology given finding with patient on anticoagulation.  He reports that the benefits of anticoagulation with Coumadin outweigh the risks given the small size and chronic nature of this finding.  Also discussed that his irritability may be a side effect of Keppra, and will change him to a different antiepileptic.  He was loaded with Depakote, and started on Depakote 3 times daily.  Recommend follow-up with neurology for further evaluation.   Final Clinical Impressions(s) / ED Diagnoses   Final diagnoses:  Irritability  Medication side effect  Frontal lobe and executive function deficit following cerebral infarction    ED Discharge Orders         Ordered    divalproex (DEPAKOTE) 125 MG DR tablet  3 times daily     03/18/18 1713           Alvira Monday, MD 03/19/18 [phone]

## 2018-05-21 DIAGNOSIS — R569 Unspecified convulsions: Secondary | ICD-10-CM | POA: Insufficient documentation

## 2018-07-04 ENCOUNTER — Other Ambulatory Visit (INDEPENDENT_AMBULATORY_CARE_PROVIDER_SITE_OTHER): Payer: Self-pay | Admitting: Vascular Surgery

## 2018-07-04 DIAGNOSIS — I6521 Occlusion and stenosis of right carotid artery: Secondary | ICD-10-CM

## 2018-07-05 ENCOUNTER — Ambulatory Visit (INDEPENDENT_AMBULATORY_CARE_PROVIDER_SITE_OTHER): Payer: BLUE CROSS/BLUE SHIELD

## 2018-07-05 ENCOUNTER — Encounter (INDEPENDENT_AMBULATORY_CARE_PROVIDER_SITE_OTHER): Payer: Self-pay | Admitting: Vascular Surgery

## 2018-07-05 ENCOUNTER — Ambulatory Visit (INDEPENDENT_AMBULATORY_CARE_PROVIDER_SITE_OTHER): Payer: BLUE CROSS/BLUE SHIELD | Admitting: Vascular Surgery

## 2018-07-05 VITALS — BP 128/73 | HR 63 | Resp 16 | Ht 63.0 in | Wt 168.6 lb

## 2018-07-05 DIAGNOSIS — I10 Essential (primary) hypertension: Secondary | ICD-10-CM | POA: Insufficient documentation

## 2018-07-05 DIAGNOSIS — I6529 Occlusion and stenosis of unspecified carotid artery: Secondary | ICD-10-CM | POA: Insufficient documentation

## 2018-07-05 DIAGNOSIS — E119 Type 2 diabetes mellitus without complications: Secondary | ICD-10-CM | POA: Diagnosis not present

## 2018-07-05 DIAGNOSIS — Z8673 Personal history of transient ischemic attack (TIA), and cerebral infarction without residual deficits: Secondary | ICD-10-CM

## 2018-07-05 DIAGNOSIS — Z87891 Personal history of nicotine dependence: Secondary | ICD-10-CM

## 2018-07-05 DIAGNOSIS — I6521 Occlusion and stenosis of right carotid artery: Secondary | ICD-10-CM | POA: Diagnosis not present

## 2018-07-05 DIAGNOSIS — N183 Chronic kidney disease, stage 3 unspecified: Secondary | ICD-10-CM | POA: Insufficient documentation

## 2018-07-05 DIAGNOSIS — I6523 Occlusion and stenosis of bilateral carotid arteries: Secondary | ICD-10-CM

## 2018-07-05 DIAGNOSIS — I2510 Atherosclerotic heart disease of native coronary artery without angina pectoris: Secondary | ICD-10-CM | POA: Diagnosis not present

## 2018-07-05 DIAGNOSIS — E1165 Type 2 diabetes mellitus with hyperglycemia: Secondary | ICD-10-CM | POA: Insufficient documentation

## 2018-07-05 DIAGNOSIS — I779 Disorder of arteries and arterioles, unspecified: Secondary | ICD-10-CM | POA: Insufficient documentation

## 2018-07-05 NOTE — Assessment & Plan Note (Signed)
We performed a carotid duplex today which shows 1 to 39% ICA stenosis bilaterally with mild calcific plaque bilaterally.  Both vertebral arteries were antegrade. He is already on anticoagulation as well as aspirin and a statin agent.  No role for intervention at this time.  Recheck in 1 year.

## 2018-07-05 NOTE — Assessment & Plan Note (Signed)
Etiology not entirely clear but his mild carotid disease would lean against this being from carotid disease.

## 2018-07-05 NOTE — Assessment & Plan Note (Signed)
blood pressure control important in reducing the progression of atherosclerotic disease. On appropriate oral medications.  

## 2018-07-05 NOTE — Assessment & Plan Note (Signed)
Status post coronary artery bypass graft without any current worrisome symptoms.

## 2018-07-05 NOTE — Progress Notes (Signed)
Patient ID: Thomas Moore, male   DOB: 1956-02-21, 63 y.o.   MRN: [id number]  Chief Complaint  Patient presents with  . New Patient (Initial Visit)    HPI Thomas Moore is a 63 y.o. male.  I am asked to see the patient by Thomas Moore for evaluation of carotid disease.  The patient reports not knowing he had a previous stroke until he had a seizure about 3 months ago.  He was told this was probably due to an old stroke.  He has a litany of other medical issues and complaints today but no recent focal neurologic symptoms present.  No fever or chills.  No arm or leg numbness or weakness, speech or swallowing difficulty, or temporary monocular blindness.  He does say that strokes run in his family.  He has had both a brother and a sister who had strokes at a young age.  He had a previous carotid duplex about a year ago which showed mild carotid disease that was done at the hospital.  We performed a carotid duplex today which shows 1 to 39% ICA stenosis bilaterally with mild calcific plaque bilaterally.  Both vertebral arteries were antegrade.   Past Medical History:  Diagnosis Date  . Coronary artery disease   . Diabetes mellitus without complication (HCC)   . History of single vessel coronary artery bypass   . Hypertension     Past Surgical History:  Procedure Laterality Date  . CORNEAL TRANSPLANT Right     Family History  Problem Relation Age of Onset  . Seizures Brother   Brother with a stroke and cancer Sister with a stroke and a history of a brain tumor as a child No bleeding or clotting disorders  Social History Social History   Tobacco Use  . Smoking status: Former Smoker    Types: Cigarettes  . Smokeless tobacco: Never Used  Substance Use Topics  . Alcohol use: No    Alcohol/week: 0.0 standard drinks  . Drug use: No    No Known Allergies  Current Outpatient Medications  Medication Sig Dispense Refill  . albuterol (PROVENTIL HFA;VENTOLIN HFA) 108 (90 Base)  MCG/ACT inhaler Inhale 2 puffs into the lungs every 6 (six) hours as needed for wheezing.    Marland Kitchen aspirin EC 81 MG tablet Take 81 mg by mouth daily.     Marland Kitchen atorvastatin (LIPITOR) 40 MG tablet Take 40 mg by mouth daily.     . furosemide (LASIX) 80 MG tablet Take 80 mg by mouth daily.     Marland Kitchen gabapentin (NEURONTIN) 300 MG capsule Take 600 mg by mouth 2 (two) times daily.     Marland Kitchen glipiZIDE (GLUCOTROL XL) 10 MG 24 hr tablet Take 10 mg by mouth 2 (two) times daily.     . metFORMIN (GLUCOPHAGE-XR) 500 MG 24 hr tablet Take 1,000 mg by mouth 2 (two) times daily.     . metoprolol succinate (TOPROL-XL) 100 MG 24 hr tablet Take 1 tablet by mouth daily.     . Multiple Vitamins tablet Take 1 tablet by mouth daily.     . nitroGLYCERIN (NITROSTAT) 0.4 MG SL tablet Place 0.4 mg under the tongue every 5 (five) minutes as needed for chest pain. May take up to 3 doses    . omeprazole (PRILOSEC) 40 MG capsule Take 40 mg by mouth daily.     . ramipril (ALTACE) 10 MG capsule Take 10 mg by mouth daily.     . sitaGLIPtin (JANUVIA)  50 MG tablet Take 1 tablet by mouth  daily    . TRULICITY 0.75 MG/0.5ML SOPN Inject 0.75 mg into the skin every 7 (seven) days.    Marland Kitchen. warfarin (COUMADIN) 1 MG tablet Take 0.5 mg by mouth daily.     Marland Kitchen. warfarin (COUMADIN) 6 MG tablet Take 6 mg by mouth daily.      No current facility-administered medications for this visit.       REVIEW OF SYSTEMS (Negative unless checked)  Constitutional: [] Weight loss  [] Fever  [] Chills Cardiac: [] Chest pain   [] Chest pressure   [x] Palpitations   [] Shortness of breath when laying flat   [] Shortness of breath at rest   [] Shortness of breath with exertion. Vascular:  [] Pain in legs with walking   [] Pain in legs at rest   [] Pain in legs when laying flat   [] Claudication   [] Pain in feet when walking  [] Pain in feet at rest  [] Pain in feet when laying flat   [] History of DVT   [] Phlebitis   [] Swelling in legs   [] Varicose veins   [] Non-healing ulcers Pulmonary:    [] Uses home oxygen   [] Productive cough   [] Hemoptysis   [] Wheeze  [] COPD   [] Asthma Neurologic:  [] Dizziness  [] Blackouts   [x] Seizures   [x] History of stroke   [] History of TIA  [] Aphasia   [] Temporary blindness   [] Dysphagia   [] Weakness or numbness in arms   [] Weakness or numbness in legs Musculoskeletal:  [] Arthritis   [] Joint swelling   [] Joint pain   [] Low back pain Hematologic:  [] Easy bruising  [] Easy bleeding   [] Hypercoagulable state   [] Anemic  [] Hepatitis Gastrointestinal:  [] Blood in stool   [] Vomiting blood  [] Gastroesophageal reflux/heartburn   [] Abdominal pain Genitourinary:  [] Chronic kidney disease   [] Difficult urination  [] Frequent urination  [] Burning with urination   [] Hematuria Skin:  [] Rashes   [x] Ulcers   [x] Wounds Psychological:  [] History of anxiety   []  History of major depression.    Physical Exam BP 128/73 (BP Location: Right Arm, Patient Position: Sitting)   Pulse 63   Resp 16   Ht 5\' 3"  (1.6 m)   Wt 168 lb 9.6 oz (76.5 kg)   BMI 29.87 kg/m  Gen:  WD/WN, NAD Head: [place]/AT, No temporalis wasting.  Ear/Nose/Throat: Hearing grossly intact, nares w/o erythema or drainage, oropharynx w/o Erythema/Exudate Eyes: Conjunctiva clear, sclera non-icteric  Neck: trachea midline.  No bruit or JVD.  Pulmonary:  Good air movement, clear to auscultation bilaterally.  Cardiac: RRR, no JVD Vascular:  Vessel Right Left  Radial Palpable Palpable                                   Gastrointestinal: soft, non-tender/non-distended. Musculoskeletal: M/S 5/5 throughout.  Extremities without ischemic changes.  No deformity or atrophy. No edema. Neurologic: Sensation grossly intact in extremities.  Symmetrical.  Speech is fluent. Motor exam as listed above. Psychiatric: Judgment intact, Mood & affect appropriate for pt's clinical situation. Dermatologic: rash and scabs on his forehead    Radiology No results found.  Labs No results found for this or any previous  visit (from the past 2160 hour(s)).  Assessment/Plan:  CAD (coronary artery disease) Status post coronary artery bypass graft without any current worrisome symptoms.  Diabetes (HCC) blood glucose control important in reducing the progression of atherosclerotic disease. Also, involved in wound healing. On appropriate medications.   Essential hypertension  blood pressure control important in reducing the progression of atherosclerotic disease. On appropriate oral medications.   History of stroke Etiology not entirely clear but his mild carotid disease would lean against this being from carotid disease.  Carotid stenosis We performed a carotid duplex today which shows 1 to 39% ICA stenosis bilaterally with mild calcific plaque bilaterally.  Both vertebral arteries were antegrade. He is already on anticoagulation as well as aspirin and a statin agent.  No role for intervention at this time.  Recheck in 1 year.      Festus Barren 07/05/2018, 9:44 AM   This note was created with Dragon medical transcription system.  Any errors from dictation are unintentional.

## 2018-07-05 NOTE — Assessment & Plan Note (Signed)
blood glucose control important in reducing the progression of atherosclerotic disease. Also, involved in wound healing. On appropriate medications.  

## 2018-07-05 NOTE — Patient Instructions (Signed)
Carotid Artery Disease  The carotid arteries are arteries on both sides of the neck. They carry blood to the brain, face, and neck. Carotid artery disease happens when these arteries become smaller (narrow) or get blocked. If these arteries become smaller or get blocked, you are more likely to have a stroke or a warning stroke (transient ischemic attack). Follow these instructions at home:  Take over-the-counter and prescription medicines only as told by your doctor.  Make sure you understand all instructions about your medicines. Do not stop taking your medicines without talking to your doctor first.  Follow your doctor's diet instructions. It is important to follow a healthy diet. ? Eat foods that include plenty of: ? Fresh fruits. ? Vegetables. ? Lean meats. ? Avoid these foods: ? Foods that are high in fat. ? Foods that are high in salt (sodium). ? Foods that are fried. ? Foods that are processed. ? Foods that have few good nutrients (poor nutritional value).  Keep a healthy weight.  Stay active. Get at least 30 minutes of activity every day.  Do not smoke.  Limit alcohol use to: ? No more than 2 drinks a day for men. ? No more than 1 drink a day for women who are not pregnant.  Do not use illegal drugs.  Keep all follow-up visits as told by your doctor. This is important. Contact a doctor if: Get help right away if:  You have any symptoms of stroke or TIA. The acronym BEFAST is an easy way to remember the main warning signs of stroke. ? B = Balance problems. Signs include dizziness, sudden trouble walking, or loss of balance ? E = Eye problems. This includes trouble seeing or a sudden change in vision. ? F = Face changes. This includes sudden weakness or numbness of the face, or the face or eyelid drooping to one side. ? A = Arm weakness or numbness. This happens suddenly and usually on one side of the body. ? S = Speech problems. This includes trouble speaking or  trouble understanding. ? T = Time. Time to call 911 or seek emergency care. Do not wait to see if symptoms go away. Make note of the time your symptoms started.  Other signs of stroke may include: ? A sudden, severe headache with no known cause. ? Feeling sick to your stomach (nauseous) or throwing up (vomiting). ? Seizure. Call your local emergency services (911 in [place] Do notdrive yourself to the clinic or hospital. Summary  The carotid arteries are arteries on both sides of the neck.  If these arteries get smaller or get blocked, you are more likely to have a stroke or a warning stroke (transient ischemic attack).  Take over-the-counter and prescription medicines only as told by your doctor.  Keep all follow-up visits as told by your doctor. This is important. This information is not intended to replace advice given to you by your health care provider. Make sure you discuss any questions you have with your health care provider. Document Released: 05/08/2012 Document Revised: 05/17/2017 Document Reviewed: 05/17/2017 Elsevier Interactive Patient Education  2019 [employer]  

## 2018-07-09 ENCOUNTER — Encounter (INDEPENDENT_AMBULATORY_CARE_PROVIDER_SITE_OTHER): Payer: Commercial Managed Care - PPO | Admitting: Vascular Surgery

## 2018-08-27 ENCOUNTER — Telehealth (INDEPENDENT_AMBULATORY_CARE_PROVIDER_SITE_OTHER): Payer: Self-pay | Admitting: Vascular Surgery

## 2018-08-27 NOTE — Telephone Encounter (Signed)
Left message with courtney of the below. AS, CMA

## 2018-08-27 NOTE — Telephone Encounter (Signed)
Thomas Moore with Thomas Moore office [phone])calling stating that patient was seen in their office today and that the noted Patient was seen by [place] in January and carotid artery was 40% stenosis. But the CTA from October showed high grade stenosis of L internal carotid artery. Wanted to know if patient needed CEA or stent?  Please advise. AS, CMA

## 2018-09-04 DIAGNOSIS — M79642 Pain in left hand: Secondary | ICD-10-CM | POA: Insufficient documentation

## 2018-09-04 DIAGNOSIS — M25642 Stiffness of left hand, not elsewhere classified: Secondary | ICD-10-CM | POA: Insufficient documentation

## 2018-09-04 DIAGNOSIS — M25641 Stiffness of right hand, not elsewhere classified: Secondary | ICD-10-CM | POA: Insufficient documentation

## 2018-09-04 DIAGNOSIS — M79641 Pain in right hand: Secondary | ICD-10-CM | POA: Insufficient documentation

## 2019-05-08 ENCOUNTER — Encounter
Admission: RE | Disposition: A | Discharge status: Home / Self Care | Payer: Self-pay | Source: Ambulatory Visit | Attending: Orthopedic Surgery

## 2019-05-08 ENCOUNTER — Ambulatory Visit: Payer: BC Managed Care – PPO | Admitting: Anesthesiology

## 2019-05-08 ENCOUNTER — Other Ambulatory Visit: Payer: Self-pay

## 2019-05-08 ENCOUNTER — Other Ambulatory Visit
Admission: RE | Admit: 2019-05-08 | Discharge: 2019-05-08 | Disposition: A | Discharge status: Home / Self Care | Payer: BC Managed Care – PPO | Source: Ambulatory Visit | Attending: Orthopedic Surgery | Admitting: Orthopedic Surgery

## 2019-05-08 ENCOUNTER — Ambulatory Visit
Admission: RE | Admit: 2019-05-08 | Discharge: 2019-05-08 | Disposition: A | Discharge status: Home / Self Care | Payer: BC Managed Care – PPO | Source: Ambulatory Visit | Attending: Orthopedic Surgery | Admitting: Orthopedic Surgery

## 2019-05-08 ENCOUNTER — Encounter: Payer: Self-pay | Admitting: Anesthesiology

## 2019-05-08 DIAGNOSIS — M1990 Unspecified osteoarthritis, unspecified site: Secondary | ICD-10-CM | POA: Diagnosis not present

## 2019-05-08 DIAGNOSIS — E669 Obesity, unspecified: Secondary | ICD-10-CM | POA: Diagnosis not present

## 2019-05-08 DIAGNOSIS — Z79899 Other long term (current) drug therapy: Secondary | ICD-10-CM | POA: Diagnosis not present

## 2019-05-08 DIAGNOSIS — E7800 Pure hypercholesterolemia, unspecified: Secondary | ICD-10-CM | POA: Diagnosis not present

## 2019-05-08 DIAGNOSIS — Z947 Corneal transplant status: Secondary | ICD-10-CM | POA: Insufficient documentation

## 2019-05-08 DIAGNOSIS — Z7901 Long term (current) use of anticoagulants: Secondary | ICD-10-CM | POA: Diagnosis not present

## 2019-05-08 DIAGNOSIS — I110 Hypertensive heart disease with heart failure: Secondary | ICD-10-CM | POA: Diagnosis not present

## 2019-05-08 DIAGNOSIS — M65132 Other infective (teno)synovitis, left wrist: Secondary | ICD-10-CM

## 2019-05-08 DIAGNOSIS — M6510 Other infective (teno)synovitis, unspecified site: Secondary | ICD-10-CM

## 2019-05-08 DIAGNOSIS — Z7984 Long term (current) use of oral hypoglycemic drugs: Secondary | ICD-10-CM | POA: Diagnosis not present

## 2019-05-08 DIAGNOSIS — E119 Type 2 diabetes mellitus without complications: Secondary | ICD-10-CM | POA: Diagnosis not present

## 2019-05-08 DIAGNOSIS — K219 Gastro-esophageal reflux disease without esophagitis: Secondary | ICD-10-CM | POA: Diagnosis not present

## 2019-05-08 DIAGNOSIS — I2510 Atherosclerotic heart disease of native coronary artery without angina pectoris: Secondary | ICD-10-CM | POA: Insufficient documentation

## 2019-05-08 DIAGNOSIS — I509 Heart failure, unspecified: Secondary | ICD-10-CM | POA: Diagnosis not present

## 2019-05-08 DIAGNOSIS — Z86711 Personal history of pulmonary embolism: Secondary | ICD-10-CM | POA: Diagnosis not present

## 2019-05-08 DIAGNOSIS — Z87891 Personal history of nicotine dependence: Secondary | ICD-10-CM | POA: Diagnosis not present

## 2019-05-08 DIAGNOSIS — Z20828 Contact with and (suspected) exposure to other viral communicable diseases: Secondary | ICD-10-CM | POA: Diagnosis not present

## 2019-05-08 DIAGNOSIS — M6518 Other infective (teno)synovitis, other site: Secondary | ICD-10-CM | POA: Insufficient documentation

## 2019-05-08 DIAGNOSIS — Z7982 Long term (current) use of aspirin: Secondary | ICD-10-CM | POA: Insufficient documentation

## 2019-05-08 DIAGNOSIS — Z951 Presence of aortocoronary bypass graft: Secondary | ICD-10-CM | POA: Diagnosis not present

## 2019-05-08 HISTORY — PX: INCISION AND DRAINAGE ABSCESS: SHX5864

## 2019-05-08 LAB — GLUCOSE, CAPILLARY
Glucose-Capillary: 45 mg/dL — ABNORMAL LOW (ref 70–99)
Glucose-Capillary: 180 mg/dL — ABNORMAL HIGH (ref 70–99)
Glucose-Capillary: 95 mg/dL (ref 70–99)

## 2019-05-08 LAB — SARS CORONAVIRUS 2 BY RT PCR (HOSPITAL ORDER, PERFORMED IN ~~LOC~~ HOSPITAL LAB): SARS Coronavirus 2: NEGATIVE

## 2019-05-08 LAB — PROTIME-INR
INR: 1.7 — ABNORMAL HIGH (ref 0.8–1.2)
Prothrombin Time: 19.8 seconds — ABNORMAL HIGH (ref 11.4–15.2)

## 2019-05-08 LAB — APTT: aPTT: 47 seconds — ABNORMAL HIGH (ref 24–36)

## 2019-05-08 SURGERY — INCISION AND DRAINAGE, ABSCESS
Anesthesia: General | Laterality: Left

## 2019-05-08 MED ORDER — DEXTROSE-NACL 5-0.9 % IV SOLN
INTRAVENOUS | Status: DC
  Administered 2019-05-08: 16:00:00 via INTRAVENOUS

## 2019-05-08 MED ORDER — LIDOCAINE HCL (PF) 2 % IJ SOLN
INTRAMUSCULAR | Status: DC | PRN
  Administered 2019-05-08: 50 mg

## 2019-05-08 MED ORDER — DEXTROSE 50 % IV SOLN
INTRAVENOUS | Status: AC
  Administered 2019-05-08: 25 mL via INTRAVENOUS
  Filled 2019-05-08: qty 50

## 2019-05-08 MED ORDER — CEFAZOLIN SODIUM-DEXTROSE 2-4 GM/100ML-% IV SOLN
INTRAVENOUS | Status: AC
  Filled 2019-05-08: qty 100

## 2019-05-08 MED ORDER — FENTANYL CITRATE (PF) 100 MCG/2ML IJ SOLN
INTRAMUSCULAR | Status: DC | PRN
  Administered 2019-05-08 (×2): 25 ug via INTRAVENOUS
  Administered 2019-05-08: 50 ug via INTRAVENOUS

## 2019-05-08 MED ORDER — METOCLOPRAMIDE HCL 5 MG/ML IJ SOLN: 5.0000 mg | Freq: Three times a day (TID) | INTRAMUSCULAR | Status: DC | PRN

## 2019-05-08 MED ORDER — ONDANSETRON HCL 4 MG/2ML IJ SOLN
INTRAMUSCULAR | Status: AC
  Filled 2019-05-08: qty 2

## 2019-05-08 MED ORDER — FENTANYL CITRATE (PF) 100 MCG/2ML IJ SOLN
INTRAMUSCULAR | Status: AC
  Filled 2019-05-08: qty 2

## 2019-05-08 MED ORDER — ONDANSETRON HCL 4 MG/2ML IJ SOLN
4.0000 mg | Freq: Once | INTRAMUSCULAR | Status: AC | PRN
  Administered 2019-05-08: 4 mg via INTRAVENOUS

## 2019-05-08 MED ORDER — MIDAZOLAM HCL 5 MG/5ML IJ SOLN
INTRAMUSCULAR | Status: DC | PRN
  Administered 2019-05-08: 2 mg via INTRAVENOUS

## 2019-05-08 MED ORDER — LIDOCAINE HCL (PF) 2 % IJ SOLN
INTRAMUSCULAR | Status: AC
  Filled 2019-05-08: qty 10

## 2019-05-08 MED ORDER — GLYCOPYRROLATE 0.2 MG/ML IJ SOLN
INTRAMUSCULAR | Status: AC
  Filled 2019-05-08: qty 1

## 2019-05-08 MED ORDER — ONDANSETRON HCL 4 MG PO TABS: 4.0000 mg | ORAL_TABLET | Freq: Four times a day (QID) | ORAL | Status: DC | PRN

## 2019-05-08 MED ORDER — NEOMYCIN-POLYMYXIN B GU 40-200000 IR SOLN
Status: AC
  Filled 2019-05-08: qty 20

## 2019-05-08 MED ORDER — PROPOFOL 10 MG/ML IV BOLUS
INTRAVENOUS | Status: AC
  Filled 2019-05-08: qty 20

## 2019-05-08 MED ORDER — ACETAMINOPHEN 10 MG/ML IV SOLN
INTRAVENOUS | Status: AC
  Filled 2019-05-08: qty 100

## 2019-05-08 MED ORDER — FENTANYL CITRATE (PF) 100 MCG/2ML IJ SOLN: 25.0000 ug | INTRAMUSCULAR | Status: DC | PRN

## 2019-05-08 MED ORDER — BUPIVACAINE HCL (PF) 0.25 % IJ SOLN
INTRAMUSCULAR | Status: AC
  Filled 2019-05-08: qty 30

## 2019-05-08 MED ORDER — PHENYLEPHRINE HCL (PRESSORS) 10 MG/ML IV SOLN
INTRAVENOUS | Status: DC | PRN
  Administered 2019-05-08: 100 ug via INTRAVENOUS

## 2019-05-08 MED ORDER — NEOMYCIN-POLYMYXIN B GU 40-200000 IR SOLN
Status: DC | PRN
  Administered 2019-05-08: 5 mL

## 2019-05-08 MED ORDER — METOCLOPRAMIDE HCL 10 MG PO TABS: 5.0000 mg | ORAL_TABLET | Freq: Three times a day (TID) | ORAL | Status: DC | PRN

## 2019-05-08 MED ORDER — ONDANSETRON HCL 4 MG/2ML IJ SOLN: 4.0000 mg | Freq: Four times a day (QID) | INTRAMUSCULAR | Status: DC | PRN

## 2019-05-08 MED ORDER — PROPOFOL 10 MG/ML IV BOLUS
INTRAVENOUS | Status: DC | PRN
  Administered 2019-05-08: 100 mg via INTRAVENOUS

## 2019-05-08 MED ORDER — ONDANSETRON HCL 4 MG/2ML IJ SOLN
INTRAMUSCULAR | Status: DC | PRN
  Administered 2019-05-08: 4 mg via INTRAVENOUS

## 2019-05-08 MED ORDER — GLYCOPYRROLATE 0.2 MG/ML IJ SOLN
INTRAMUSCULAR | Status: DC | PRN
  Administered 2019-05-08: 0.2 mg via INTRAVENOUS

## 2019-05-08 MED ORDER — ACETAMINOPHEN 10 MG/ML IV SOLN
1000.0000 mg | Freq: Once | INTRAVENOUS | Status: AC
  Administered 2019-05-08: 1000 mg via INTRAVENOUS

## 2019-05-08 MED ORDER — CHLORHEXIDINE GLUCONATE 4 % EX LIQD: 60.0000 mL | Freq: Once | CUTANEOUS | Status: DC

## 2019-05-08 MED ORDER — SODIUM CHLORIDE 0.9 % IV SOLN: INTRAVENOUS | Status: DC

## 2019-05-08 MED ORDER — DEXTROSE 50 % IV SOLN
25.0000 mL | Freq: Once | INTRAVENOUS | Status: AC
  Administered 2019-05-08: 16:00:00 25 mL via INTRAVENOUS

## 2019-05-08 MED ORDER — MIDAZOLAM HCL 2 MG/2ML IJ SOLN
INTRAMUSCULAR | Status: AC
  Filled 2019-05-08: qty 2

## 2019-05-08 MED ORDER — CEFAZOLIN SODIUM-DEXTROSE 2-4 GM/100ML-% IV SOLN
2.0000 g | INTRAVENOUS | Status: AC
  Administered 2019-05-08: 2 g via INTRAVENOUS

## 2019-05-08 SURGICAL SUPPLY — 30 items
BNDG CMPR STD VLCR NS LF 5.8X3 (GAUZE/BANDAGES/DRESSINGS)
BNDG ELASTIC 3X5.8 VLCR NS LF (GAUZE/BANDAGES/DRESSINGS) ×1 IMPLANT
BNDG ESMARK 4X12 TAN STRL LF (GAUZE/BANDAGES/DRESSINGS) ×2 IMPLANT
CANISTER SUCT 1200ML W/VALVE (MISCELLANEOUS) ×2 IMPLANT
CAST PADDING 3X4FT ST 30246 (SOFTGOODS) ×1
COVER WAND RF STERILE (DRAPES) ×2 IMPLANT
CUFF TOURN SGL QUICK 18X4 (TOURNIQUET CUFF) ×2 IMPLANT
CUFF TOURN SGL QUICK 24 (TOURNIQUET CUFF)
CUFF TRNQT CYL 24X4X16.5-23 (TOURNIQUET CUFF) IMPLANT
DRSG DERMACEA 8X12 NADH (GAUZE/BANDAGES/DRESSINGS) ×2 IMPLANT
DURAPREP 26ML APPLICATOR (WOUND CARE) ×1 IMPLANT
ELECT REM PT RETURN 9FT ADLT (ELECTROSURGICAL) ×2
ELECTRODE REM PT RTRN 9FT ADLT (ELECTROSURGICAL) ×1 IMPLANT
GAUZE SPONGE 4X4 12PLY STRL (GAUZE/BANDAGES/DRESSINGS) ×2 IMPLANT
GLOVE BIOGEL M STRL SZ7.5 (GLOVE) ×2 IMPLANT
GOWN STRL REUS W/ TWL LRG LVL3 (GOWN DISPOSABLE) ×2 IMPLANT
GOWN STRL REUS W/TWL LRG LVL3 (GOWN DISPOSABLE) ×4
IV CATH ANGIO 14GX1.88 NO SAFE (IV SOLUTION) ×1 IMPLANT
KIT TURNOVER CYSTO (KITS) ×2 IMPLANT
LOOP RED MAXI  1X406MM (MISCELLANEOUS) ×1
LOOP VESSEL MAXI 1X406 RED (MISCELLANEOUS) IMPLANT
NS IRRIG 500ML POUR BTL (IV SOLUTION) ×2 IMPLANT
PACK EXTREMITY ARMC (MISCELLANEOUS) ×2 IMPLANT
PAD CAST CTTN 3X4 STRL (SOFTGOODS) ×1 IMPLANT
PAD PREP 24X41 OB/GYN DISP (PERSONAL CARE ITEMS) ×2 IMPLANT
PADDING CAST COTTON 3X4 STRL (SOFTGOODS) ×1
STOCKINETTE 48X4 2 PLY STRL (GAUZE/BANDAGES/DRESSINGS) ×1 IMPLANT
STOCKINETTE BIAS CUT 3 980034 (MISCELLANEOUS) ×1 IMPLANT
STOCKINETTE STRL 4IN 9604848 (GAUZE/BANDAGES/DRESSINGS) ×2 IMPLANT
SUT ETHILON 4 0 P 3 18 (SUTURE) ×2 IMPLANT

## 2019-05-08 NOTE — Progress Notes (Signed)
Spoke with Dr Marcello Moores. Patients blood sugar is 45. Patient took all of his medications this morning and has not ate since about 0700. Verbal order for 1/2 amp of D5 and  D5, NS fluid. Recheck blood sugar in 30 minutes.

## 2019-05-08 NOTE — Anesthesia Post-op Follow-up Note (Signed)
Anesthesia QCDR form completed.        

## 2019-05-08 NOTE — Op Note (Signed)
OPERATIVE NOTE  DATE OF SURGERY:  05/08/2019  PATIENT NAME:  Thomas Moore   DOB: 07/29/1955  MRN: [id number]   PRE-OPERATIVE DIAGNOSIS: Infectious tenosynovitis of the left ring flexor tendon  POST-OPERATIVE DIAGNOSIS:  Same  PROCEDURE: Irrigation and debridement of the left ring flexor tendon sheath  SURGEON:  Marciano Sequin., M.D.   ANESTHESIA: general  ESTIMATED BLOOD LOSS: Minimal  FLUIDS REPLACED: 800 mL of crystalloid  TOURNIQUET TIME: 54 minutes  DRAINS: 1 small vessel loop  INDICATIONS FOR SURGERY: Thomas Moore is a 63 y.o. year old male who I previously undergone left ring trigger finger release as per Dr. Tonie Griffith approximately 2 weeks ago.  He had the onset of some pain and discomfort several days ago and was subsequently treated by his PCP with Rocephin and Levaquin.  However, he presented earlier today with increased pain and some purulent drainage from the previous incision site.  Findings were consistent with infectious flexor tenosynovitis.  After discussion of the risk benefits of surgical invention, patient expressed understanding of the risk benefits agree with plans for irrigation and debridement of the left ring flexor tendon sheath.   PROCEDURE IN DETAIL: The patient was brought into the operating room and, after adequate general anesthesia was achieved, turned was placed on the patient's upper left arm.  The patient's left hand and arm were cleaned and prepped with Betadine and draped in usual sterile fashion.  A "timeout" was performed as per usual protocol.  The left upper extremity was elevated and the tourniquet was inflated to 250 mmHg.  Loupe magnification was used throughout the procedure.  A hemostat was used to further open the previous incision site overlying the tendon sheath of the left ring finger.  Some purulent material was expressed.  Swabs were obtained for Gram stain, culture, and sensitivity.  The previous incision was enlarged somewhat so  as to better visualize the tendon.  Some necrotic appearing fibrinous material was noted along the region of the tendon sheath.  This was debrided using tenotomy scissors.  Next, a 14-gauge Angiocath was inserted down the flexor tendon sheath and the tendon sheath was irrigated with copious amounts of normal saline with antibiotic solution.  Irrigation was continued until the fluid was clear.  Patient irrigation at the incision site was also performed using normal saline with antibiotic solution.  A total of 1000 cc of irrigation was utilized.  A small vessel loop was inserted into the tendon sheath and brought out through the incision site.  The tourniquet was deflated after total tourniquet time of 54 minutes.  Good hemostasis was noted.  A bulky hand dressing was applied.  The patient tolerated procedure well.  He was transported to the recovery room in stable condition.    P. Holley Bouche M.D.

## 2019-05-08 NOTE — Discharge Instructions (Signed)
°  Instructions after Hand / Wrist Surgery ° ° James P. Hooten, Jr., M.D. ° Dept. of Orthopaedics & Sports Medicine ° [facility] ° [address] ° [place], [place]  [address] ° ° Phone: [phone]   Fax: [phone] ° ° °DIET: °• Drink plenty of non-alcoholic fluids & begin a light diet. °• Resume your normal diet the day after surgery. ° °ACTIVITY:  °• Keep the hand elevated above the level of the elbow. °• Begin gently moving the fingers on a regular basis to avoid stiffness. °• Avoid any heavy lifting, pushing, or pulling with the operative hand. °• Do not drive or operate any equipment until instructed. ° °WOUND CARE:  °• Keep the splint/bandage clean and dry.  °• The splint and stitches will be removed in the office. °• Continue to use the ice packs periodically to reduce pain and swelling. °• You may bathe or shower after the stitches are removed at the first office visit following surgery. ° °MEDICATIONS: °• You may resume your regular medications. °• Please take the pain medication as prescribed. °• Do not take pain medication on an empty stomach. °• Do not drive or drink alcoholic beverages when taking pain medications. ° °CALL THE OFFICE FOR: °• Temperature above 101 degrees °• Excessive bleeding or drainage on the dressing. °• Excessive swelling, coldness, or paleness of the fingers. °• Persistent nausea and vomiting. ° °FOLLOW-UP:  °• You should have an appointment to return to the office in 7-10 days after surgery.  ° °REMEMBER: R.I.C.E. = Rest, Ice, Compression, Elevation !  ° ° ° °AMBULATORY SURGERY  °DISCHARGE INSTRUCTIONS ° ° °1) The drugs that you were given will stay in your system until tomorrow so for the next 24 hours you should not: ° °A) Drive an automobile °B) Make any legal decisions °C) Drink any alcoholic beverage ° ° °2) You may resume regular meals tomorrow.  Today it is better to start with liquids and gradually work up to solid foods. ° °You may eat anything you prefer, but  it is better to start with liquids, then soup and crackers, and gradually work up to solid foods. ° ° °3) Please notify your doctor immediately if you have any unusual bleeding, trouble breathing, redness and pain at the surgery site, drainage, fever, or pain not relieved by medication. ° ° ° °4) Additional Instructions: ° ° ° ° ° ° ° °Please contact your physician with any problems or Same Day Surgery at [phone], Monday through Friday 6 am to 4 pm, or [facility] [place] [facility] number at [phone]. °

## 2019-05-08 NOTE — Anesthesia Preprocedure Evaluation (Signed)
Anesthesia Evaluation  Patient identified by MRN, date of birth, ID band Patient awake    Reviewed: Allergy & Precautions, NPO status , Patient's Chart, lab work & pertinent test results, reviewed documented beta blocker date and time   Airway Mallampati: II  TM Distance: >3 FB     Dental  (+) Chipped   Pulmonary former smoker,           Cardiovascular hypertension, Pt. on medications and Pt. on home beta blockers + CAD and + CABG       Neuro/Psych PSYCHIATRIC DISORDERS Anxiety CVA, No Residual Symptoms    GI/Hepatic   Endo/Other  diabetes, Type 2  Renal/GU      Musculoskeletal   Abdominal   Peds  Hematology   Anesthesia Other Findings BS was 45 today. He took his insulin this am. He has dropped to BS 30 in the past. Corneal transplant. Echo 1 yr ago 50-55.  Reproductive/Obstetrics                             Anesthesia Physical Anesthesia Plan  ASA: III  Anesthesia Plan: General   Post-op Pain Management:    Induction: Intravenous  PONV Risk Score and Plan:   Airway Management Planned: LMA  Additional Equipment:   Intra-op Plan:   Post-operative Plan:   Informed Consent: I have reviewed the patients History and Physical, chart, labs and discussed the procedure including the risks, benefits and alternatives for the proposed anesthesia with the patient or authorized representative who has indicated his/her understanding and acceptance.       Plan Discussed with: CRNA  Anesthesia Plan Comments:         Anesthesia Quick Evaluation

## 2019-05-08 NOTE — Anesthesia Procedure Notes (Signed)
Procedure Name: LMA Insertion Performed by: Marquez Ceesay, CRNA Pre-anesthesia Checklist: Patient identified, Patient being monitored, Timeout performed, Emergency Drugs available and Suction available Patient Re-evaluated:Patient Re-evaluated prior to induction Oxygen Delivery Method: Circle system utilized Preoxygenation: Pre-oxygenation with 100% oxygen Induction Type: IV induction Ventilation: Mask ventilation without difficulty LMA: LMA inserted LMA Size: 3.5 Tube type: Oral Number of attempts: 1 Placement Confirmation: positive ETCO2 and breath sounds checked- equal and bilateral Tube secured with: Tape Dental Injury: Teeth and Oropharynx as per pre-operative assessment        

## 2019-05-08 NOTE — Transfer of Care (Signed)
Immediate Anesthesia Transfer of Care Note  Patient: Thomas Moore  Procedure(s) Performed: INCISION AND DRAINAGE ABSCESS (Left )  Patient Location: PACU  Anesthesia Type:General  Level of Consciousness: sedated  Airway & Oxygen Therapy: Patient Spontanous Breathing and Patient connected to face mask oxygen  Post-op Assessment: Report given to RN and Post -op Vital signs reviewed and stable  Post vital signs: Reviewed  Last Vitals:  Vitals Value Taken Time  BP 124/78 05/08/19 2039  Temp    Pulse 85 05/08/19 2039  Resp 27 05/08/19 2039  SpO2 100 % 05/08/19 2039  Vitals shown include unvalidated device data.  Last Pain:  Vitals:   05/08/19 1611  PainSc: 0-No pain         Complications: No apparent anesthesia complications

## 2019-05-09 ENCOUNTER — Encounter: Payer: Self-pay | Admitting: Orthopedic Surgery

## 2019-05-09 NOTE — Anesthesia Postprocedure Evaluation (Signed)
Anesthesia Post Note  Patient: Thomas Moore  Procedure(s) Performed: INCISION AND DRAINAGE ABSCESS (Left )  Patient location during evaluation: PACU Anesthesia Type: General Level of consciousness: awake and alert Pain management: pain level controlled Vital Signs Assessment: post-procedure vital signs reviewed and stable Respiratory status: spontaneous breathing, nonlabored ventilation, respiratory function stable and patient connected to nasal cannula oxygen Cardiovascular status: blood pressure returned to baseline and stable Postop Assessment: no apparent nausea or vomiting Anesthetic complications: no     Last Vitals:  Vitals:   05/08/19 2153 05/08/19 2223  BP: 139/84 (!) 142/84  Pulse: 81 86  Resp: 12 18  Temp: 36.6 C   SpO2: 95% 96%    Last Pain:  Vitals:   05/08/19 2223  PainSc: 0-No pain                 Joniel Graumann S

## 2019-05-11 NOTE — H&P (Signed)
ORTHOPAEDIC HISTORY & PHYSICAL  Progress Notes by Feliberto Gottron, [place] at 05/08/2019 10:15 AM   Chief Complaint:     Chief Complaint  Patient presents with  . Post Operative Visit    Left trigger finger release 10.30.20    Thomas Moore is a 63 y.o. male who presents today status post left ring trigger finger release date of surgery 04/23/2019. Patient was doing well up until this morning, around 3 AM developed some pain and discomfort along the left ring finger along the incision site. Had some slight increase in stiffness. No catching triggering locking. No warmth redness or drainage.  Today May 08, 2019 patient comes in with increasing pain swelling and redness throughout the left hand.  Patient states on Tuesday, May 06, 2019 patient seen by PCP, started on Levaquin, cephalexin discontinued and given IM Rocephin.  Patient saw some mild improvement with the Rocephin, followed up with the PCP on Wednesday, May 07, 2019 was received another shot of Rocephin.  He comes in today states that overall swelling redness is continuing to improve but he is having a lot of drainage along the incision site with increased pain with flexion and extension of the left ring digit.  Past Medical History:     Past Medical History:  Diagnosis Date  . CHF (congestive heart failure) (CMS-HCC) 2000   EF 40% 2012  . Congenital coronary artery anomaly   . Coronary atherosclerosis of unspecified type of vessel, native or graft   . Essential hypertension, benign   . GERD (gastroesophageal reflux disease)   . Hearing loss of both ears 02/2012   hearing aids  . Hiatal hernia   . Hx of pulmonary embolus   . Long term (current) use of anticoagulants   . Methicillin resistant Staphylococcus aureus in conditions classified elsewhere and of unspecified site   . Nephrolithiasis   . Obesity   . Osteoarthrosis, unspecified whether generalized or localized, hand   . Other and  unspecified noninfectious gastroenteritis and colitis(558.9)   . Other pulmonary embolism and infarction (CMS-HCC)   . Pure hypercholesterolemia   . Sebaceous cyst   . Type II or unspecified type diabetes mellitus without mention of complication, not stated as uncontrolled (CMS-HCC)   . Undiagnosed cardiac murmurs, unspecified   . Urolithiasis    s/p ESWL    Past Surgical History:      Past Surgical History:  Procedure Laterality Date  . CORNEAL TRANSPLANT Right   . CORONARY ARTERY BYPASS GRAFT  02/1996  . KNEE SURGERY Left    age 41 due to left knee benign tumor  . LITHOTRIPSY     for nephrolithiasis    Past Family History: Family History       Family History  Problem Relation Age of Onset  . Stroke Sister   . Cancer Father        lung cancer  . Lung cancer Father   . Coronary Artery Disease (Blocked arteries around heart) Mother   . Alzheimer's disease Mother   . Stroke Brother   . Seizures Brother   . Brain cancer Sister   . High blood pressure (Hypertension) Other        family hx  . Arthritis Other        family hx      Medications:       Current Outpatient Medications Ordered in Epic  Medication Sig Dispense Refill  . aspirin 81 MG EC tablet Take 81 mg by mouth  daily.    . atorvastatin (LIPITOR) 40 MG tablet TAKE 1 TABLET BY MOUTH ONCE DAILY. 90 tablet 0  . blood glucose diagnostic test strip Use 2 (two) times daily Use as instructed.  ONE TOUCH ULTRA TEST STRIPS E11.42 200 each 3  . calcium carb-vit D2-minerals Tab Take 600 Units by mouth once daily.      . divalproex (DEPAKOTE) 250 MG DR tablet Take 250 mg in the morning, 250 mg in the afternoon, and  500 mg at night. 120 tablet 3  . dulaglutide (TRULICITY) 0.75 mg/0.5 mL PnIj Inject 0.75 mg subcutaneously every 7 (seven) days 6 mL 1  . FUROsemide (LASIX) 80 MG tablet Take 80 mg by mouth daily.    Marland Kitchen glipiZIDE (GLUCOTROL XL) 10 MG XL tablet TAKE 2 TABLETS BY MOUTH  ONCE A DAY 180 tablet 0  . ipratropium (ATROVENT) 0.03 % nasal spray Place 2 sprays into both nostrils every 8 (eight) hours as needed    . levoFLOXacin (LEVAQUIN) 500 MG tablet Take 1 tablet (500 mg total) by mouth once daily for 7 days 7 tablet 0  . metFORMIN (GLUCOPHAGE-XR) 500 MG XR tablet TAKE 2 TABLETS BY MOUTH TWICE DAILY WITH MEALS 360 tablet 0  . metoprolol succinate (TOPROL-XL) 100 MG XL tablet Take 1 tablet (100 mg total) by mouth once daily 90 tablet 0  . multivitamin (MULTIVITAMIN) tablet Take 1 tablet by mouth daily.    Marland Kitchen omeprazole (PRILOSEC) 40 MG DR capsule TAKE (1) CAPSULE BY MOUTH ONCEA DAY. 90 capsule 0  . potassium chloride (K-DUR,KLOR-CON) 20 MEQ CR tablet Take 20 mEq by mouth daily.    . ramipriL (ALTACE) 10 MG capsule Take 1 capsule (10 mg total) by mouth once daily 90 capsule 2  . tiZANidine (ZANAFLEX) 4 MG tablet Take 1 tablet (4 mg total) by mouth 3 (three) times daily 45 tablet 1  . warfarin (COUMADIN) 1 MG tablet Take 1 tablet (1 mg total) by mouth as directed Take 1/2 tablet daily with Coumadin 6 mg tablet. 90 tablet 1  . warfarin (COUMADIN) 6 MG tablet TAKE 1 TABLET BY MOUTH AS DIRECTED 90 tablet 0   No current Epic-ordered facility-administered medications on file.     Allergies: No Known Allergies   Review of Systems:  A comprehensive 14 point ROS was performed, reviewed by me today, and the pertinent orthopaedic findings are documented in the HPI.   Exam: BP 121/62 (BP Location: Left upper arm, Patient Position: Sitting, BP Cuff Size: Adult)   Ht 160 cm (5\' 3" )   Wt 76.7 kg (169 lb)   BMI 29.94 kg/m  General/Constitutional: The patient appears to be well-nourished, well-developed, and in no acute distress. Neuro/Psych: Normal mood and affect, oriented to person, place and time. Eyes: Non-icteric. Pupils are equal, round, and reactive to light, and exhibit synchronous movement. ENT: Unremarkable. Lymphatic: No palpable  adenopathy. Respiratory: Non-labored breathing Cardiovascular: No edema, swelling or tenderness, except as noted in detailed exam. Integumentary: No impressive skin lesions present, except as noted in detailed exam. Musculoskeletal: Unremarkable, except as noted in detailed exam.  General:  Well developed, well nourished, no apparent distress, normal affect, normal gait with no antalgic component.   HEENT: Head normocephalic, atraumatic, PERRL.   Abdomen: Soft, non tender, non distended, Bowel sounds present.  Heart: Examination of the heart reveals regular, rate, and rhythm.  There is no murmur noted on ascultation.  There is a normal apical pulse.  Lungs: Lungs are clear to auscultation.  There  is no wheeze, rhonchi, or crackles.  There is normal expansion of bilateral chest walls.     Left Upper Extremity: Examination of the left hand shows mild swelling, mild erythema along the left ring trigger finger incision site.  Mild erythema along the palmar and dorsal aspect of the hand.  No warmth.  Patient is significantly tender along the A1 pulley incision site, there has been dehiscence of the incision with some purulent drainage noted.  He has pain with active and passive flexion.  Digit is held in slight flexion and he has moderate to severe pain with passive extension of the ring finger.  Patient can reach near full passive extension despite pain.  Sensation is intact.  Very little swelling throughout the ring finger.  Impression: Status post trigger finger release [Z98.890] Status post trigger finger release  (primary encounter diagnosis) Cellulitis of finger of left hand  Plan:  1.  Risks, benefits, complications of a left hand ring finger incision and drainage of abscess has been discussed with the patient.  Patient has agreed and consented for the procedure with Dr. Ernest Pine today.  Patient will [facility], drive-through testing.  He  will go to registration at 4 PM at the hospital.  Surgery scheduled for 6 PM.  He will not eat or drink anything prior to his surgery. This note was generated in part with voice recognition software and I apologize for any typographical errors that were not detected and corrected.   Patience Musca MPA-C     Electronically signed by Patience Musca, PA on 05/09/2019 8:09 AM

## 2019-05-13 LAB — AEROBIC/ANAEROBIC CULTURE W GRAM STAIN (SURGICAL/DEEP WOUND)

## 2019-05-15 ENCOUNTER — Other Ambulatory Visit: Payer: Self-pay

## 2019-05-15 ENCOUNTER — Encounter: Payer: Self-pay | Admitting: Occupational Therapy

## 2019-05-15 ENCOUNTER — Ambulatory Visit: Payer: BC Managed Care – PPO | Attending: Orthopedic Surgery | Admitting: Occupational Therapy

## 2019-05-15 DIAGNOSIS — M79642 Pain in left hand: Secondary | ICD-10-CM | POA: Insufficient documentation

## 2019-05-15 DIAGNOSIS — M25642 Stiffness of left hand, not elsewhere classified: Secondary | ICD-10-CM | POA: Insufficient documentation

## 2019-05-15 DIAGNOSIS — R600 Localized edema: Secondary | ICD-10-CM | POA: Insufficient documentation

## 2019-05-15 DIAGNOSIS — M6281 Muscle weakness (generalized): Secondary | ICD-10-CM | POA: Insufficient documentation

## 2019-05-15 NOTE — Patient Instructions (Signed)
Contrast - but use heating and cold pack - keep incision close  Wear compression isotoner glove night time   Tendon glides -blocked 10 reps Opposition to digits 5 reps  AROM wrist flexion and extention  10 reps  3 x day

## 2019-05-15 NOTE — Therapy (Signed)
[facility] [address] [place], [address] Phone: [phone]   Fax:  [phone]  Occupational Therapy Evaluation  Patient Details  Name: Thomas Moore MRN: [id number] Date of Birth: 30-May-1956 Referring Provider (OT): Rachelle Hora   Encounter Date: 05/15/2019  OT End of Session - 05/15/19 1617    Visit Number  1    Number of Visits  8    Date for OT Re-Evaluation  06/26/19    OT Start Time  0850    OT Stop Time  0943    OT Time Calculation (min)  53 min    Activity Tolerance  Patient tolerated treatment well    Behavior During Therapy  [facility] for tasks assessed/performed       Past Medical History:  Diagnosis Date  . Coronary artery disease   . Diabetes mellitus without complication ([place])   . History of single vessel coronary artery bypass   . Hypertension     Past Surgical History:  Procedure Laterality Date  . CORNEAL TRANSPLANT Right   . INCISION AND DRAINAGE ABSCESS Left 05/08/2019   Procedure: INCISION AND DRAINAGE ABSCESS;  Surgeon: Dereck Leep, MD;  Location: [facility] ORS;  Service: Orthopedics;  Laterality: Left;    There were no vitals filed for this visit.  Subjective Assessment - 05/15/19 1115    Subjective   I had carpaltunnel in both hands but that has been good the last few months - but I got trigger finger -and the shot did not work - so Dr Rudene Christians did surgery - butthen I got a infection - seen Rachelle Hora yesterday    Pertinent History  History of bilateral CTS but symptoms decrease - pt had L 4th trigger finger that required release on 04/23/2019 by Dr Rudene Christians -but then pt develop infection with I&D abscess  by Dr Marry Guan 12/3 - and refer to OT/hand therapy    Pain Score  6     Pain Location  Hand    Pain Orientation  Left    Pain Descriptors / Indicators  Aching;Tender    Pain Type  Surgical pain    Pain Onset  1 to 4 weeks ago    Pain Frequency  Intermittent    Aggravating  Factors   if making fist        [facility] OT Assessment - 05/15/19 0001      Assessment   Medical Diagnosis  L 4th trigger finger and I&D abscess    Referring Provider (OT)  Rachelle Hora    Onset Date/Surgical Date  05/08/19    Hand Dominance  Right    Next MD Visit  --   05/23/2019     Precautions   Precaution Comments  open wound- bandage on       [place]  Full time employment    Leisure  watch tv, do own house work - 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       Left Hand AROM   L Thumb Opposition to Index  --   Opposition lag 1 cm to 5th digit   L Index  MCP 0-90  80 Degrees    L Index PIP 0-100  80 Degrees    L Long  MCP 0-90  80 Degrees    L Long PIP 0-100  80 Degrees  L Ring  MCP 0-90  75 Degrees    L Ring PIP 0-100  70 Degrees    L Little  MCP 0-90  85 Degrees    L Little PIP 0-100  80 Degrees      contrast done prior to AROM    Contrast - but use heating and cold pack - keep incision close  Wear compression isotoner glove night time   Tendon glides -blocked 10 reps Opposition to digits 5 reps  AROM wrist flexion and extention  10 reps  3 x day                  OT Education - 05/15/19 1617    Education Details  findings ofeval and HEP    Person(s) Educated  Patient    Methods  Explanation;Demonstration;Tactile cues;Verbal cues;Handout    Comprehension  Verbal cues required;Returned demonstration;Verbalized understanding       OT Short Term Goals - 05/15/19 1622      OT SHORT TERM GOAL #1   Title  Pt to be independent in HEP to decrease edema , pain and increase AROM in flexion of L hand digits    Baseline  no lknowledge of HEP    Time  3    Period  Weeks    Status  New    Target Date  06/05/19      OT SHORT TERM GOAL #2   Title  Pain on PRWHE improve with more than 15 points    Baseline  at eval PRWHE score 27/50    Time  4    Period  Weeks    Status  New    Target  Date  06/12/19        OT Long Term Goals - 05/15/19 1624      OT LONG TERM GOAL #1   Title  Pt AROM in L hand improve for pt to touch palm to grasp cylinder object to perfrom his job    Baseline  MC's 75-85 and PIP 70-80 degrees - pain increase 6/10 with AROM - not back to work    Time  5    Period  Weeks    Status  New    Target Date  06/19/19      OT LONG TERM GOAL #2   Title  Grip and prehension strength in L hand increase to more than 50 % compare to R hand to use hand morethan 75% on PRWHE    Baseline  NT - incision still opan -bandage- pain 6/10 - using hand only 40%    Time  6    Period  Weeks    Status  New    Target Date  06/26/19      OT LONG TERM GOAL #3   Title  PRWHE function score improve with more than 20 points    Baseline  eval function at [facility] 30/50    Time  6    Period  Weeks    Status  New    Target Date  06/26/19            Plan - 05/15/19 1618    Clinical Impression Statement  Pt is 3 wks s/p L 4th digit trigger finger release - that he develop cellulits and needed I&D abscess 1 week ago - pt has bandage on hand - did not remove -per pt he has to change it 2 x day - pt show increase edema, and pain - with  decrease AROM for flexion in all digits but 4th the worse- decrease strength -and when incision healed  would need scar mamangement  - all limiting his furnctional use of L hand in ADL's and IADL's    OT Occupational Profile and History  Problem Focused Assessment - Including review of records relating to presenting problem    Occupational performance deficits (Please refer to evaluation for details):  ADL's;IADL's;Work;Play;Leisure    Body Structure / Function / Physical Skills  ADL;Decreased knowledge of precautions;Flexibility;ROM;UE functional use;Scar mobility;Skin integrity;IADL;Pain;Strength;Edema;FMC    Rehab Potential  Good    Clinical Decision Making  Limited treatment options, no task modification necessary    Comorbidities Affecting  Occupational Performance:  May have comorbidities impacting occupational performance   bilateral CTS - had nerve conduction symptoms under control at the moment   Modification or Assistance to Complete Evaluation   No modification of tasks or assist necessary to complete eval    OT Frequency  --   1-2 wk depending on healing of incision   OT Duration  6 weeks    OT Treatment/Interventions  Self-care/ADL training;Therapeutic exercise;Patient/family education;Splinting;Paraffin;Fluidtherapy;Contrast Bath;Manual Therapy;Passive range of motion;Scar mobilization    Plan  assess progress with HEP and adjust as needed    OT Home Exercise Plan  see pt instruction    Consulted and Agree with Plan of Care  Patient       Patient will benefit from skilled therapeutic intervention in order to improve the following deficits and impairments:   Body Structure / Function / Physical Skills: ADL, Decreased knowledge of precautions, Flexibility, ROM, UE functional use, Scar mobility, Skin integrity, IADL, Pain, Strength, Edema, FMC       Visit Diagnosis: Stiffness of left hand, not elsewhere classified - Plan: Ot plan of care cert/re-cert  Localized edema - Plan: Ot plan of care cert/re-cert  Pain in left hand - Plan: Ot plan of care cert/re-cert  Muscle weakness (generalized) - Plan: Ot plan of care cert/re-cert    Problem List Patient Active Problem List   Diagnosis Date Noted  . CAD (coronary artery disease) 07/05/2018  . Diabetes (HCC) 07/05/2018  . Essential hypertension 07/05/2018  . History of stroke 07/05/2018  . Carotid stenosis 07/05/2018  . Confusion   . Altered mental status 08/08/2017    Oletta Cohn OTR/L,CLT 05/15/2019, 4:33 PM  [facility] [address] [place], [address] Phone: [phone]   Fax:  [phone]  Name: DARIC KOREN MRN: [id number] Date of Birth: 1956/01/13

## 2019-05-20 ENCOUNTER — Ambulatory Visit: Payer: BC Managed Care – PPO | Admitting: Occupational Therapy

## 2019-05-20 ENCOUNTER — Other Ambulatory Visit: Payer: Self-pay

## 2019-05-20 DIAGNOSIS — M6281 Muscle weakness (generalized): Secondary | ICD-10-CM

## 2019-05-20 DIAGNOSIS — R600 Localized edema: Secondary | ICD-10-CM

## 2019-05-20 DIAGNOSIS — M79642 Pain in left hand: Secondary | ICD-10-CM

## 2019-05-20 DIAGNOSIS — M25642 Stiffness of left hand, not elsewhere classified: Secondary | ICD-10-CM | POA: Diagnosis not present

## 2019-05-20 NOTE — Patient Instructions (Addendum)
Same HEP - add tapping of digits extention

## 2019-05-20 NOTE — Therapy (Signed)
[facility] [address], [place], [address] Phone: [phone]   Fax:  [phone]  Occupational Therapy Treatment  Patient Details  Name: Thomas Moore MRN: [id number] Date of Birth: May 17, 1956 Referring Provider (OT): Rachelle Hora   Encounter Date: 05/20/2019  OT End of Session - 05/20/19 1412    Visit Number  2    Number of Visits  8    Date for OT Re-Evaluation  06/26/19    OT Start Time  3762    OT Stop Time  1430    OT Time Calculation (min)  36 min    Activity Tolerance  Patient tolerated treatment well    Behavior During Therapy  [facility] for tasks assessed/performed       Past Medical History:  Diagnosis Date  . Coronary artery disease   . Diabetes mellitus without complication ([place])   . History of single vessel coronary artery bypass   . Hypertension     Past Surgical History:  Procedure Laterality Date  . CORNEAL TRANSPLANT Right   . INCISION AND DRAINAGE ABSCESS Left 05/08/2019   Procedure: INCISION AND DRAINAGE ABSCESS;  Surgeon: Dereck Leep, MD;  Location: [facility] ORS;  Service: Orthopedics;  Laterality: Left;    There were no vitals filed for this visit.  Subjective Assessment - 05/20/19 1410    Subjective   I did get heatingpad - think it is little better- did get note to be out of work until Friday when I see the surgeon    Pertinent History  History of bilateral CTS but symptoms decrease - pt had L 4th trigger finger that required release on 04/23/2019 by Dr Rudene Christians -but then pt develop infection with I&D abscess  by Dr Marry Guan 12/3 - and refer to OT/hand therapy    Patient Stated Goals  Want to get my hand better so I can do my job    Currently in Pain?  Yes    Pain Score  5          [facility] OT Assessment - 05/20/19 0001      Left Hand AROM   L Index  MCP 0-90  85 Degrees    L Index PIP 0-100  85 Degrees    L Long  MCP 0-90  85 Degrees    L Long PIP 0-100  90 Degrees    L Ring   MCP 0-90  85 Degrees    L Ring PIP 0-100  75 Degrees    L Little  MCP 0-90  90 Degrees    L Little PIP 0-100  90 Degrees       Pt show increase AROM in all joints and all digits  Check on open incision - bandage removed and replace with new on          OT Treatments/Exercises (OP) - 05/20/19 0001      LUE Contrast Bath   Time  9 minutes    Comments  prior to AROM and soft tissue       tapping of digits and gentle extention stretch to all digits  Add to HEP  Review and done HEP for tendon glides - AAROM by OT  And change the way he block intrinsic fist  And composite fist -reinforce not to  flex wrist  And gentle stretch  AROM for wrist flexion , extention         OT Education - 05/20/19 1411  Education Details  progress and HEP    Person(s) Educated  Patient    Methods  Explanation;Demonstration;Tactile cues;Verbal cues;Handout    Comprehension  Verbal cues required;Returned demonstration;Verbalized understanding       OT Short Term Goals - 05/15/19 1622      OT SHORT TERM GOAL #1   Title  Pt to be independent in HEP to decrease edema , pain and increase AROM in flexion of L hand digits    Baseline  no lknowledge of HEP    Time  3    Period  Weeks    Status  New    Target Date  06/05/19      OT SHORT TERM GOAL #2   Title  Pain on PRWHE improve with more than 15 points    Baseline  at eval PRWHE score 27/50    Time  4    Period  Weeks    Status  New    Target Date  06/12/19        OT Long Term Goals - 05/20/19 1410      OT LONG TERM GOAL #1   Period  Weeks    Status  Revised            Plan - 05/20/19 1412    Clinical Impression Statement  Pt is about 4 wks s/p L 4th digit trigger finger release - that develop cellulitis and needed I&D abscess a week later - he still has bandage on and open incision - pt show increase AROM in all digits - cont to have edema in 4th digit , pain    OT Occupational Profile and History  Problem Focused  Assessment - Including review of records relating to presenting problem    Occupational performance deficits (Please refer to evaluation for details):  ADL's;IADL's;Work;Play;Leisure    Body Structure / Function / Physical Skills  ADL;Decreased knowledge of precautions;Flexibility;ROM;UE functional use;Scar mobility;Skin integrity;IADL;Pain;Strength;Edema;FMC    Rehab Potential  Good    Clinical Decision Making  Limited treatment options, no task modification necessary    Comorbidities Affecting Occupational Performance:  May have comorbidities impacting occupational performance    Modification or Assistance to Complete Evaluation   No modification of tasks or assist necessary to complete eval    OT Frequency  --   1-2 x wk depending on healing of incision   OT Duration  6 weeks    OT Treatment/Interventions  Self-care/ADL training;Therapeutic exercise;Patient/family education;Splinting;Paraffin;Fluidtherapy;Contrast Bath;Manual Therapy;Passive range of motion;Scar mobilization    Plan  assess progress with HEP and adjust as needed    OT Home Exercise Plan  see pt instruction    Consulted and Agree with Plan of Care  Patient       Patient will benefit from skilled therapeutic intervention in order to improve the following deficits and impairments:   Body Structure / Function / Physical Skills: ADL, Decreased knowledge of precautions, Flexibility, ROM, UE functional use, Scar mobility, Skin integrity, IADL, Pain, Strength, Edema, FMC       Visit Diagnosis: Stiffness of left hand, not elsewhere classified  Localized edema  Pain in left hand  Muscle weakness (generalized)    Problem List Patient Active Problem List   Diagnosis Date Noted  . CAD (coronary artery disease) 07/05/2018  . Diabetes (HCC) 07/05/2018  . Essential hypertension 07/05/2018  . History of stroke 07/05/2018  . Carotid stenosis 07/05/2018  . Confusion   . Altered mental status 08/08/2017    Oletta Cohn OTR/L,CLT  05/20/2019, 3:00 PM  [facility] [address], [place], [address] Phone: [phone]   Fax:  [phone]  Name: Thomas Moore MRN: [id number] Date of Birth: 06-10-1955

## 2019-05-22 ENCOUNTER — Ambulatory Visit: Payer: BC Managed Care – PPO | Admitting: Occupational Therapy

## 2019-05-22 ENCOUNTER — Other Ambulatory Visit: Payer: Self-pay

## 2019-05-22 DIAGNOSIS — R600 Localized edema: Secondary | ICD-10-CM

## 2019-05-22 DIAGNOSIS — M79642 Pain in left hand: Secondary | ICD-10-CM

## 2019-05-22 DIAGNOSIS — M6281 Muscle weakness (generalized): Secondary | ICD-10-CM

## 2019-05-22 DIAGNOSIS — M25642 Stiffness of left hand, not elsewhere classified: Secondary | ICD-10-CM | POA: Diagnosis not present

## 2019-05-22 NOTE — Therapy (Signed)
[facility] [address] [place], [address] Phone: [phone]   Fax:  [phone]  Occupational Therapy Treatment  Patient Details  Name: Thomas Moore MRN: [id number] Date of Birth: Aug 10, 1955 Referring Provider (OT): Cranston Neighbor   Encounter Date: 05/22/2019  OT End of Session - 05/22/19 0830    Visit Number  3    Number of Visits  8    Date for OT Re-Evaluation  06/26/19    OT Start Time  0831    OT Stop Time  0859    OT Time Calculation (min)  28 min    Activity Tolerance  Patient tolerated treatment well    Behavior During Therapy  [facility] for tasks assessed/performed       Past Medical History:  Diagnosis Date  . Coronary artery disease   . Diabetes mellitus without complication (HCC)   . History of single vessel coronary artery bypass   . Hypertension     Past Surgical History:  Procedure Laterality Date  . CORNEAL TRANSPLANT Right   . INCISION AND DRAINAGE ABSCESS Left 05/08/2019   Procedure: INCISION AND DRAINAGE ABSCESS;  Surgeon: Donato Heinz, MD;  Location: [facility] ORS;  Service: Orthopedics;  Laterality: Left;    There were no vitals filed for this visit.  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  Yes    Pain Score  3     Pain Location  Hand    Pain Orientation  Left    Pain Descriptors / Indicators  Aching;Tender    Pain Type  Surgical pain    Pain Onset  1 to 4 weeks ago    Aggravating Factors   making fist         [facility] OT Assessment - 05/22/19 0001      Left Hand AROM   L Index  MCP 0-90  80 Degrees    L Index PIP 0-100  90 Degrees    L Long  MCP 0-90  85 Degrees    L Long PIP 0-100  90 Degrees    L Ring  MCP 0-90  85 Degrees    L Ring PIP 0-100  80 Degrees    L Little  MCP 0-90  90 Degrees    L Little PIP 0-100  90 Degrees       Pt cont to make progress in AROM in all digits - earlier this week and again this date  Mostly limited at 4th digit  - but still healing - and incision is open          OT Treatments/Exercises (OP) - 05/22/19 0001      LUE Contrast Bath   Time  9 minutes    Comments  prior to AROM      soft  tissue mobs to CT spread  And graston tool nr 2 on volar digit and CT - prior to AROM    tapping of digits and gentle extention stretch to all digits  Add to HEP last time -and to cont with   tendon glides - AAROM by OT ffor MC's Pt to block with ulnar side of R palm - to do intrinsic fist  - doing better this way And composite fist -reinforce not to  flex wrist  And gentle stretch  AROM for wrist flexion , extention       OT Education - 05/22/19 0829    Education Details  progress and HEP    Person(s) Educated  Patient    Methods  Explanation;Demonstration;Tactile cues;Verbal cues;Handout    Comprehension  Verbal cues required;Returned demonstration;Verbalized understanding       OT Short Term Goals - 05/15/19 1622      OT SHORT TERM GOAL #1   Title  Pt to be independent in HEP to decrease edema , pain and increase AROM in flexion of L hand digits    Baseline  no lknowledge of HEP    Time  3    Period  Weeks    Status  New    Target Date  06/05/19      OT SHORT TERM GOAL #2   Title  Pain on PRWHE improve with more than 15 points    Baseline  at eval PRWHE score 27/50    Time  4    Period  Weeks    Status  New    Target Date  06/12/19        OT Long Term Goals - 05/20/19 1410      OT LONG  TERM GOAL #1   Period  Weeks    Status  Revised            Plan - 05/22/19 0830    Clinical Impression Statement  Pt is about 4 wks and 2 days s/p L 4th digit trigger finger release and developed infection and had I&D abscess a week later - incision still open and keep bandage on - pt is diabetic - pt AROM in digits improved greatly - pt to cont edema control and AROM - do not force 4th digit -pt to see MD tomorrow and will follow up with me in about 10 days    OT Occupational Profile and History  Problem Focused Assessment - Including review of records relating to presenting problem    Occupational performance deficits (Please refer to evaluation for details):  ADL's;IADL's;Work;Play;Leisure    Body Structure / Function / Physical Skills  ADL;Decreased knowledge of precautions;Flexibility;ROM;UE functional use;Scar mobility;Skin integrity;IADL;Pain;Strength;Edema;FMC    Rehab Potential  Good    Clinical Decision Making  Limited treatment options, no task modification necessary    Comorbidities Affecting Occupational Performance:  May have comorbidities impacting occupational performance    Modification or Assistance to Complete Evaluation   No modification of tasks or assist necessary to complete eval    OT Frequency  --   1-2 x wk depending on healing   OT Duration  6 weeks    OT Treatment/Interventions  Self-care/ADL training;Therapeutic exercise;Patient/family education;Splinting;Paraffin;Fluidtherapy;Contrast Bath;Manual Therapy;Passive range of motion;Scar mobilization    Plan  assess progress with HEP and adjust as needed    OT Home Exercise Plan  see pt instruction    Consulted and Agree with Plan of Care  Patient       Patient will  benefit from skilled therapeutic intervention in order to improve the following deficits and impairments:   Body Structure / Function / Physical Skills: ADL, Decreased knowledge of precautions, Flexibility, ROM, UE functional use, Scar mobility,  Skin integrity, IADL, Pain, Strength, Edema, FMC       Visit Diagnosis: Stiffness of left hand, not elsewhere classified  Localized edema  Pain in left hand  Muscle weakness (generalized)    Problem List Patient Active Problem List   Diagnosis Date Noted  . CAD (coronary artery disease) 07/05/2018  . Diabetes ([place]) 07/05/2018  . Essential hypertension 07/05/2018  . History of stroke 07/05/2018  . Carotid stenosis 07/05/2018  . Confusion   . Altered mental status 08/08/2017    Rosalyn Gess OTR/L,CLT 05/22/2019, 9:03 AM  [place] [facility] [address] [place], [address] Phone: [phone]   Fax:  [phone]  Name: Thomas Moore MRN: [id number] Date of Birth: 1955/12/30

## 2019-05-22 NOTE — Patient Instructions (Signed)
Same HEP  

## 2019-06-03 ENCOUNTER — Ambulatory Visit: Payer: BC Managed Care – PPO | Admitting: Occupational Therapy

## 2019-06-03 ENCOUNTER — Other Ambulatory Visit: Payer: Self-pay

## 2019-06-03 DIAGNOSIS — M79642 Pain in left hand: Secondary | ICD-10-CM

## 2019-06-03 DIAGNOSIS — M6281 Muscle weakness (generalized): Secondary | ICD-10-CM

## 2019-06-03 DIAGNOSIS — M25642 Stiffness of left hand, not elsewhere classified: Secondary | ICD-10-CM

## 2019-06-03 DIAGNOSIS — R600 Localized edema: Secondary | ICD-10-CM

## 2019-06-03 NOTE — Patient Instructions (Signed)
Pt to focus on decrease edema- contrast  Compression silicon sleeve on digits day time  Compression glove night time  Do not force PROM if swollen  and focus on intrinsic fist - during tendon glides

## 2019-06-03 NOTE — Therapy (Signed)
[place] [facility] [address] [place], [address] Phone: [phone]   Fax:  [phone]  Occupational Therapy Treatment  Patient Details  Name: Thomas Moore MRN: [id number] Date of Birth: 01/18/1956 Referring Provider (OT): Cranston Neighbor   Encounter Date: 06/03/2019  OT End of Session - 06/03/19 0857    Visit Number  4    Number of Visits  8    Date for OT Re-Evaluation  06/26/19    OT Start Time  0815    OT Stop Time  0850    OT Time Calculation (min)  35 min    Activity Tolerance  Patient tolerated treatment well    Behavior During Therapy  [facility] for tasks assessed/performed       Past Medical History:  Diagnosis Date  . Coronary artery disease   . Diabetes mellitus without complication (HCC)   . History of single vessel coronary artery bypass   . Hypertension     Past Surgical History:  Procedure Laterality Date  . CORNEAL TRANSPLANT Right   . INCISION AND DRAINAGE ABSCESS Left 05/08/2019   Procedure: INCISION AND DRAINAGE ABSCESS;  Surgeon: Donato Heinz, MD;  Location: [facility] ORS;  Service: Orthopedics;  Laterality: Left;    There were no vitals filed for this visit.  Subjective Assessment - 06/03/19 0825    Subjective   I went back to work tomorrow- and schedule for tomorrow and Thursday - and then Sat to Monday - my skin is just peeling where my bandage has been on my hand for so long - finger feels swollen    Pertinent History  History of bilateral CTS but symptoms decrease - pt had L 4th trigger finger that required release on 04/23/2019 by Dr Rosita Kea -but then pt develop infection with I&D abscess  by Dr Ernest Pine 12/3 - and refer to OT/hand therapy    Patient Stated Goals  Want to get my hand better so I can do my job    Currently in Pain?  Yes    Pain Score  6     Pain Location  Finger (Comment which one)    Pain Orientation  Left    Pain Descriptors / Indicators  Aching    Pain Type  Surgical  pain    Pain Onset  More than a month ago    Pain Frequency  Intermittent         [facility] OT Assessment - 06/03/19 0001      Left Hand AROM   L Ring  MCP 0-90  80 Degrees    L Ring PIP 0-100  65 Degrees      Pt incision assess - about 3/4 healed -and skin peeling where bandage on hand for so long been - pt if just sitting at home can keep it open - but pt return to work yesterday -reinforce to keep covered and out of water  Edema increase this date in 4th digit - PIP L 7.2 cm , R 6.4 , and proximal phalanges L 8cm , R 6.8 cm  Pt show decrease AROM PIP more than MC - decrease 15 degrees compare to last time seen           OT Treatments/Exercises (OP) - 06/03/19 0001      LUE Contrast Bath   Time  9 minutes    Comments  prior to AROM - decrease edema        soft tissue  mobs to CT spread  MC joint mobs prior to AROM   tapping of digits and gentle extention stretch to all digits - increase tightness this date   cont with tapping at home - but also tight in R hand  tendon glides - AAROM by OT for MC's Pt to block with ulnar side of R palm - to do intrinsic fist  - PIP flexion worse this date because of edema - pt to focus on intrinsic fist AROM And composite fist -reinforce not to flex wrist  And gentle stretch  AROM for wrist flexion , extention  Fitted with Silicon compression sleeve for 4th to wear during day and cont with compression glove for night time        OT Education - 06/03/19 0826    Education Details  decrease edema    Person(s) Educated  Patient    Methods  Explanation;Demonstration;Tactile cues;Verbal cues;Handout    Comprehension  Verbal cues required;Returned demonstration;Verbalized understanding       OT Short Term Goals - 05/15/19 1622      OT SHORT TERM GOAL #1   Title  Pt to be independent in HEP to decrease edema , pain and increase AROM in flexion of L hand digits    Baseline  no lknowledge of HEP    Time  3    Period  Weeks     Status  New    Target Date  06/05/19      OT SHORT TERM GOAL #2   Title  Pain on PRWHE improve with more than 15 points    Baseline  at eval PRWHE score 27/50    Time  4    Period  Weeks    Status  New    Target Date  06/12/19        OT Long Term Goals - 05/20/19 1410      OT LONG TERM GOAL #1   Period  Weeks    Status  Revised            Plan - 06/03/19 0827    Clinical Impression Statement  Pt is about 5 wks s/p L 4th digit trigger finger release and I&D abscess a week later - pt incision healing very well since last time seen - healed about 3/4 of the way - pt did return to work yesterday and reinforce again for pt to keep incision covered and not to work in water - pt has this date increase edema in 4th digit by 1.2 cm increase and decrease PIP flexion - from 80 last time to this date 65 degrees - add this date compression to digit during day and to do contrast using packs / AROM PIP flexion    OT Occupational Profile and History  Problem Focused Assessment - Including review of records relating to presenting problem    Occupational performance deficits (Please refer to evaluation for details):  ADL's;IADL's;Work;Play;Leisure    Body Structure / Function / Physical Skills  ADL;Decreased knowledge of precautions;Flexibility;ROM;UE functional use;Scar mobility;Skin integrity;IADL;Pain;Strength;Edema;FMC    Rehab Potential  Good    Clinical Decision Making  Limited treatment options, no task modification necessary    Comorbidities Affecting Occupational Performance:  May have comorbidities impacting occupational performance    Modification or Assistance to Complete Evaluation   No modification of tasks or assist necessary to complete eval    OT Frequency  --   1-2 x wk   OT Duration  6 weeks  OT Treatment/Interventions  Self-care/ADL training;Therapeutic exercise;Patient/family education;Splinting;Paraffin;Fluidtherapy;Contrast Bath;Manual Therapy;Passive range of  motion;Scar mobilization    Plan  assess edema and AROM - improved    OT Home Exercise Plan  see pt instruction    Consulted and Agree with Plan of Care  Patient       Patient will benefit from skilled therapeutic intervention in order to improve the following deficits and impairments:   Body Structure / Function / Physical Skills: ADL, Decreased knowledge of precautions, Flexibility, ROM, UE functional use, Scar mobility, Skin integrity, IADL, Pain, Strength, Edema, FMC       Visit Diagnosis: Stiffness of left hand, not elsewhere classified  Localized edema  Pain in left hand  Muscle weakness (generalized)    Problem List Patient Active Problem List   Diagnosis Date Noted  . CAD (coronary artery disease) 07/05/2018  . Diabetes (HCC) 07/05/2018  . Essential hypertension 07/05/2018  . History of stroke 07/05/2018  . Carotid stenosis 07/05/2018  . Confusion   . Altered mental status 08/08/2017    Oletta Cohn OTR/l,CLT 06/03/2019, 9:37 AM  [place] [facility] SPORTS MEDICINE [address] [place], [address] Phone: [phone]   Fax:  [phone]  Name: Thomas Moore MRN: [id number] Date of Birth: 03/12/1956

## 2019-06-10 ENCOUNTER — Ambulatory Visit: Payer: BC Managed Care – PPO | Attending: Orthopedic Surgery | Admitting: Occupational Therapy

## 2019-06-10 ENCOUNTER — Other Ambulatory Visit: Payer: Self-pay

## 2019-06-10 DIAGNOSIS — M6281 Muscle weakness (generalized): Secondary | ICD-10-CM | POA: Diagnosis present

## 2019-06-10 DIAGNOSIS — M79642 Pain in left hand: Secondary | ICD-10-CM | POA: Diagnosis present

## 2019-06-10 DIAGNOSIS — R600 Localized edema: Secondary | ICD-10-CM | POA: Diagnosis present

## 2019-06-10 DIAGNOSIS — M25642 Stiffness of left hand, not elsewhere classified: Secondary | ICD-10-CM | POA: Insufficient documentation

## 2019-06-10 NOTE — Therapy (Signed)
[place] [facility] SPORTS MEDICINE [address], [place], [address] Phone: [phone]   Fax:  [phone]  Occupational Therapy Treatment  Patient Details  Name: Thomas Moore MRN: [id number] Date of Birth: [birth year] Referring Provider (OT): Rachelle Hora   Encounter Date: 06/10/2019  OT End of Session - 06/10/19 0900    Visit Number  5    Number of Visits  8    Date for OT Re-Evaluation  06/26/19    OT Start Time  0815    OT Stop Time  0855    OT Time Calculation (min)  40 min    Activity Tolerance  Patient tolerated treatment well    Behavior During Therapy  [facility] for tasks assessed/performed       Past Medical History:  Diagnosis Date  . Coronary artery disease   . Diabetes mellitus without complication ([place])   . History of single vessel coronary artery bypass   . Hypertension     Past Surgical History:  Procedure Laterality Date  . CORNEAL TRANSPLANT Right   . INCISION AND DRAINAGE ABSCESS Left 05/08/2019   Procedure: INCISION AND DRAINAGE ABSCESS;  Surgeon: Dereck Leep, MD;  Location: [facility] ORS;  Service: Orthopedics;  Laterality: Left;    There were no vitals filed for this visit.  Subjective Assessment - 06/10/19 0825    Subjective   Since this weekend I cannot bend my finger - when I bend the others - it stays straight - I went back to work last week - the incision is close - but I do not have time at work to do my exercises - only able to do it one time - maybe 2    Pertinent History  History of bilateral CTS but symptoms decrease - pt had L 4th trigger finger that required release on 04/23/2019 by Dr Rudene Christians -but then pt develop infection with I&D abscess  by Dr Marry Guan 12/3 - and refer to OT/hand therapy    Patient Stated Goals  Want to get my hand better so I can do my job    Currently in Pain?  Yes    Pain Score  4     Pain Location  Finger (Comment which one)    Pain Orientation  Left    Pain Descriptors /  Indicators  Aching    Pain Type  Surgical pain    Pain Onset  More than a month ago    Pain Frequency  Intermittent    Aggravating Factors   making fist - PROM for composite fist         [facility] OT Assessment - 06/10/19 0001      Left Hand AROM   L Ring  MCP 0-90  75 Degrees    L Ring PIP 0-100  50 Degrees       Pt lost about 30 degrees from 2 wks ago - and 15 degrees from last week at PIP flexion  Pt incision healed now and able to initiate scar mobs  Discuss in length with pt importance for doing ROM at work -and increase it this date to every 2 hrs composite PROM for flexion of 4th          OT Treatments/Exercises (OP) - 06/10/19 0001      LUE Contrast Bath   Time  9 minutes    Comments  intrinsic fist stretch in heat - prior to ROM       scar  massage done manual by OT and using xtractor with PROM and AROM  Pt ed on scar massage 2-3 x day  And wearing silicon sleeve on 4th digit during day and fitted with buddy strap to use 3rd and 4th together - 2hrs on at time  Pt demo understanding  And cont isotoner glove at night time   Composite flexion done and place and hold  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 work   but reinforce again importance   Pt do not use composite fist a lot in the job -and there fore he needs to do PROM composite flexion of 4th        OT Education - 06/10/19 0900    Education Details  pt lost motion the last week - HEP and use of buddy strap    Person(s) Educated  Patient    Methods  Explanation;Demonstration;Tactile cues;Verbal cues;Handout    Comprehension  Verbal cues required;Returned demonstration;Verbalized understanding       OT Short Term Goals - 05/15/19 1622      OT SHORT TERM GOAL #1   Title  Pt to be independent in HEP to decrease edema , pain and increase AROM in flexion of L hand digits    Baseline  no lknowledge of HEP    Time  3    Period   Weeks    Status  New    Target Date  06/05/19      OT SHORT TERM GOAL #2   Title  Pain on PRWHE improve with more than 15 points    Baseline  at eval PRWHE score 27/50    Time  4    Period  Weeks    Status  New    Target Date  06/12/19        OT Long Term Goals - 05/20/19 1410      OT LONG TERM GOAL #1   Period  Weeks    Status  Revised            Plan - 06/10/19 0901    Clinical Impression Statement  Pt is about 4 1/2  wks s/p L 4th digit trigger finger release and I&D abscess a week later - pt incision healed this date  -he returned back to work a week ago-- pt had last time and  this date increase edema in 4th digit by 1.2 cm increase and decrease PIP flexion - from 80 on the 17th , last week 65 and today 50 degrees   - provided pt with compression sock for digit  last time - and to cont contrast - pt report he did not had time to do his HEP since he went back to work - only doing it 1 maybe 2 times - discuss with pt scar massage , scartissue - and PROM for coomposite flexion every 2 hrs - and buddy strrap - pt report he cannot remember at work and cannot wear it at work - but reinforce again importance of PROM - pt lost 30 degrees in 2 wks    OT Occupational Profile and History  Problem Focused Assessment - Including review of records relating to presenting problem    Occupational performance deficits (Please refer to evaluation for details):  ADL's;IADL's;Work;Play;Leisure    Body Structure / Function / Physical Skills  ADL;Decreased knowledge of precautions;Flexibility;ROM;UE functional use;Scar mobility;Skin integrity;IADL;Pain;Strength;Edema;FMC    Rehab  Potential  Good    Clinical Decision Making  Limited treatment options, no task modification necessary    Comorbidities Affecting Occupational Performance:  May have comorbidities impacting occupational performance    Modification or Assistance to Complete Evaluation   No modification of tasks or assist necessary to  complete eval    OT Frequency  --   1-2 x wk   OT Duration  4 weeks    OT Treatment/Interventions  Self-care/ADL training;Therapeutic exercise;Patient/family education;Splinting;Paraffin;Fluidtherapy;Contrast Bath;Manual Therapy;Passive range of motion;Scar mobilization    Plan  assess progress in scar tissue , PROM , and edema    OT Home Exercise Plan  see pt instruction    Consulted and Agree with Plan of Care  Patient       Patient will benefit from skilled therapeutic intervention in order to improve the following deficits and impairments:   Body Structure / Function / Physical Skills: ADL, Decreased knowledge of precautions, Flexibility, ROM, UE functional use, Scar mobility, Skin integrity, IADL, Pain, Strength, Edema, FMC       Visit Diagnosis: Stiffness of left hand, not elsewhere classified  Localized edema  Pain in left hand  Muscle weakness (generalized)    Problem List Patient Active Problem List   Diagnosis Date Noted  . CAD (coronary artery disease) 07/05/2018  . Diabetes (HCC) 07/05/2018  . Essential hypertension 07/05/2018  . History of stroke 07/05/2018  . Carotid stenosis 07/05/2018  . Confusion   . Altered mental status 08/08/2017    Oletta Cohn OTR/L,CLT 06/10/2019, 9:09 AM  [facility] SP[facility]TS MEDIC[facility] [address] [place], [address] Phone: [phone]   Fax:  [phone]  Name: Thomas Moore MRN: [id number] Date of Birth: 1956-06-03

## 2019-06-10 NOTE — Patient Instructions (Signed)
Pt to do contrast -and can do epson salt to help for swelling Scar massage  Composite flexion of 4th digit PROM - to touch palm every 2 hrs 10 reps  tendon glides  Buddy strap fit for pt to wear during day  As much as he can

## 2019-06-12 ENCOUNTER — Ambulatory Visit: Payer: BC Managed Care – PPO | Admitting: Occupational Therapy

## 2019-06-12 ENCOUNTER — Other Ambulatory Visit: Payer: Self-pay

## 2019-06-12 DIAGNOSIS — M79642 Pain in left hand: Secondary | ICD-10-CM

## 2019-06-12 DIAGNOSIS — M6281 Muscle weakness (generalized): Secondary | ICD-10-CM

## 2019-06-12 DIAGNOSIS — M25642 Stiffness of left hand, not elsewhere classified: Secondary | ICD-10-CM

## 2019-06-12 DIAGNOSIS — R600 Localized edema: Secondary | ICD-10-CM

## 2019-06-12 NOTE — Patient Instructions (Signed)
Add every 2 hrs DIP/PIP flexion stretch 1 min x2 And then composite flexion PROM  Buddy strap 2 hrs at time  Scar mobs  Contrast

## 2019-06-12 NOTE — Therapy (Signed)
[place] [facility] [address] [place], [address] Phone: [phone]   Fax:  [phone]  Occupational Therapy Treatment  Patient Details  Name: Thomas Moore MRN: [id number] Date of Birth: 12/17/1955 Referring Provider (OT): Rachelle Hora   Encounter Date: 06/12/2019  OT End of Session - 06/12/19 1040    Visit Number  6    Number of Visits  8    Date for OT Re-Evaluation  06/26/19    OT Start Time  1033    OT Stop Time  1108    OT Time Calculation (min)  35 min    Activity Tolerance  Patient tolerated treatment well    Behavior During Therapy  [facility] for tasks assessed/performed       Past Medical History:  Diagnosis Date  . Coronary artery disease   . Diabetes mellitus without complication ([place])   . History of single vessel coronary artery bypass   . Hypertension     Past Surgical History:  Procedure Laterality Date  . CORNEAL TRANSPLANT Right   . INCISION AND DRAINAGE ABSCESS Left 05/08/2019   Procedure: INCISION AND DRAINAGE ABSCESS;  Surgeon: Dereck Leep, MD;  Location: [facility] ORS;  Service: Orthopedics;  Laterality: Left;    There were no vitals filed for this visit.  Subjective Assessment - 06/12/19 1035    Subjective   I seen the Dr yesterday and I need to move it- if I don't want to have surgery in few weeks - Need to see Dr Rudene Christians in 4 wks - my finger still do not want to bend    Pertinent History  History of bilateral CTS but symptoms decrease - pt had L 4th trigger finger that required release on 04/23/2019 by Dr Rudene Christians -but then pt develop infection with I&D abscess  by Dr Marry Guan 12/3 - and refer to OT/hand therapy    Patient Stated Goals  Want to get my hand better so I can do my job    Currently in Pain?  Yes    Pain Score  4     Pain Location  Finger (Comment which one)    Pain Orientation  Left    Pain Descriptors / Indicators  Tightness    Pain Type  Surgical pain    Pain Onset  More than  a month ago    Aggravating Factors   making and bending it down         [facility] OT Assessment - 06/12/19 0001      Left Hand AROM   L Ring  MCP 0-90  85 Degrees    L Ring PIP 0-100  40 Degrees      decrease PIP flexion compare to 2 days ago again - during session did get to 50 AROM during composite and place and hold 60  But with intrinsic block 30 degrees          OT Treatments/Exercises (OP) - 06/12/19 0001      LUE Contrast Bath   Time  9 minutes    Comments  intrinsic fist stretch in heat         scar massage done manual by OT , vibration and using xtractor with PROM and AROM flexion and extention - feel sting with flexion  Pt ed on scar massage 2-3 x day  And wearing buddy strap to use 3rd and 4th together - 2hrs on at time  Pt demo understanding  And cont  isotoner glove at night time   DIP/PIP flexion stretch done 3 x 1 min  - pt to do athome every 2 hrs prior to  Composite flexion PROM and  place and hold  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 work   but reinforce again importance of these stretches every 2 hrs         OT Education - 06/12/19 1039    Education Details  review stretches and importance to do every 2 hrs    Person(s) Educated  Patient    Methods  Explanation;Demonstration;Tactile cues;Verbal cues;Handout    Comprehension  Verbal cues required;Returned demonstration;Verbalized understanding       OT Short Term Goals - 05/15/19 1622      OT SHORT TERM GOAL #1   Title  Pt to be independent in HEP to decrease edema , pain and increase AROM in flexion of L hand digits    Baseline  no lknowledge of HEP    Time  3    Period  Weeks    Status  New    Target Date  06/05/19      OT SHORT TERM GOAL #2   Title  Pain on PRWHE improve with more than 15 points    Baseline  at eval PRWHE score 27/50    Time  4    Period  Weeks    Status  New    Target Date  06/12/19         OT Long Term Goals - 05/20/19 1410      OT LONG TERM GOAL #1   Period  Weeks    Status  Revised            Plan - 06/12/19 1040    Clinical Impression Statement  Pt is about 5 wks s/p L 4th digit trigger finger release and I&D abscess week latera - pt incision healed but scar adhesion limiting his PIP and DIP flexion since the last week to 2 wks - reinforce imortance that even being at work has to do PROM and stretches every 2 hrs - and cont with buddy strap - had this am again decrease motoin compare to 2 days ago    OT Occupational Profile and History  Problem Focused Assessment - Including review of records relating to presenting problem    Occupational performance deficits (Please refer to evaluation for details):  ADL's;IADL's;Work;Play;Leisure    Body Structure / Function / Physical Skills  ADL;Decreased knowledge of precautions;Flexibility;ROM;UE functional use;Scar mobility;Skin integrity;IADL;Pain;Strength;Edema;FMC    Rehab Potential  Good    Clinical Decision Making  Limited treatment options, no task modification necessary    Comorbidities Affecting Occupational Performance:  May have comorbidities impacting occupational performance    Modification or Assistance to Complete Evaluation   No modification of tasks or assist necessary to complete eval    OT Frequency  --   1-2 x wk   OT Duration  4 weeks    OT Treatment/Interventions  Self-care/ADL training;Therapeutic exercise;Patient/family education;Splinting;Paraffin;Fluidtherapy;Contrast Bath;Manual Therapy;Passive range of motion;Scar mobilization    Plan  assess progress in scar tissue , PROM , and edema    OT Home Exercise Plan  see pt instruction    Consulted and Agree with Plan of Care  Patient       Patient will benefit from skilled therapeutic intervention in order to improve the following deficits and impairments:  Body Structure / Function / Physical Skills: ADL, Decreased knowledge of precautions,  Flexibility, ROM, UE functional use, Scar mobility, Skin integrity, IADL, Pain, Strength, Edema, FMC       Visit Diagnosis: Localized edema  Pain in left hand  Muscle weakness (generalized)  Stiffness of left hand, not elsewhere classified    Problem List Patient Active Problem List   Diagnosis Date Noted  . CAD (coronary artery disease) 07/05/2018  . Diabetes (HCC) 07/05/2018  . Essential hypertension 07/05/2018  . History of stroke 07/05/2018  . Carotid stenosis 07/05/2018  . Confusion   . Altered mental status 08/08/2017    Oletta Cohn OTR/l,CLT 06/12/2019, 11:13 AM  [place] [facility] [address] [place], [address] Phone: [phone]   Fax:  [phone]  Name: JUVENTINO PAVONE MRN: [id number] Date of Birth: Oct 31, 1955

## 2019-06-20 ENCOUNTER — Ambulatory Visit: Payer: BC Managed Care – PPO | Admitting: Occupational Therapy

## 2019-06-20 ENCOUNTER — Other Ambulatory Visit: Payer: Self-pay

## 2019-06-20 DIAGNOSIS — M6281 Muscle weakness (generalized): Secondary | ICD-10-CM

## 2019-06-20 DIAGNOSIS — M79642 Pain in left hand: Secondary | ICD-10-CM

## 2019-06-20 DIAGNOSIS — M25642 Stiffness of left hand, not elsewhere classified: Secondary | ICD-10-CM

## 2019-06-20 DIAGNOSIS — R600 Localized edema: Secondary | ICD-10-CM

## 2019-06-20 NOTE — Patient Instructions (Addendum)
Prolonged flexion stretch - composite for 4th every hour to 2 hrs  And hold 1-2 min at time  5 reps  Do composite AROM fisting - tendon glides  Cont scar massage  And compression sleeve on 4th

## 2019-06-20 NOTE — Therapy (Signed)
[place] [facility] [address] [place], [address] Phone: [phone]   Fax:  [phone]  Occupational Therapy Treatment  Patient Details  Name: Thomas Moore MRN: [id number] Date of Birth: 1955-09-09 Referring Provider (OT): Cranston Neighbor   Encounter Date: 06/20/2019  OT End of Session - 06/20/19 0932    Visit Number  7    Number of Visits  8    Date for OT Re-Evaluation  06/26/19    OT Start Time  0915    OT Stop Time  0952    OT Time Calculation (min)  37 min    Activity Tolerance  Patient tolerated treatment well    Behavior During Therapy  [facility] for tasks assessed/performed       Past Medical History:  Diagnosis Date  . Coronary artery disease   . Diabetes mellitus without complication (H[facility])   . History of single vessel coronary artery bypass   . Hypertension     Past Surgical History:  Procedure Laterality Date  . CORNEAL TRANSPLANT Right   . INCISION AND DRAINAGE ABSCESS Left 05/08/2019   Procedure: INCISION AND DRAINAGE ABSCESS;  Surgeon: Thomas Heinz, MD;  Location: [facility] ORS;  Service: Orthopedics;  Laterality: Left;    There were no vitals filed for this visit.  Subjective Assessment - 06/20/19 0915    Subjective   The finger still not bending - I don't think we going to get it - I try and work it - don't see Dr Thomas Moore until the 3rd Febr - there has been a lot of people testing positive at work - I had my first vaccine shot yesterday    Pertinent History  History of bilateral CTS but symptoms decrease - pt had L 4th trigger finger that required release on 04/23/2019 by Dr Thomas Moore -but then pt develop infection with I&D abscess  by Dr Thomas Moore 12/3 - and refer to OT/hand therapy    Patient Stated Goals  Want to get my hand better so I can do my job    Currently in Pain?  Yes    Pain Location  Finger (Comment which one)    Pain Orientation  Left    Pain Descriptors / Indicators  Tightness;Aching;Sore     Pain Type  Surgical pain    Pain Onset  More than a month ago    Pain Frequency  Intermittent    Aggravating Factors   PROM for composite fist         [facility] OT Assessment - 06/20/19 0001      Left Hand AROM   L Ring  MCP 0-90  80 Degrees   PROM to 90   L Ring PIP 0-100  35 Degrees   PROM to 100       Pt cont to loose motion AROM in 4th digit every time pt comes   5th PIP increase by 0.6 cm - cont compression      OT Treatments/Exercises (OP) - 06/20/19 0001      LUE Paraffin   Number Minutes Paraffin  8 Minutes    LUE Paraffin Location  Hand    Comments  coban flexion wrapto 4th digit         scar massage done manual by OT , vibration and using xtractor with PROM and AROM flexion and extention - feel sting with flexion  Pt ed on scar massage 2-3 x day  cica scar pad for night time -  on palmar scar  And wearing buddy strap to use 3rd and 4th together - 2hrs on at time  Pt demo understanding  And cont isotoner glove at night time   Composite flexion PROM and prolonged stretch done -and hold   pt to do every hour to 2 hrs  REINFORCE - pt flexion decrease every time he comes  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 work - but will try  but reinforce again importance of these stretches every 2 hrs         OT Education - 06/20/19 0932    Education Details  review stretches and importance to do every 2 hrs    Person(s) Educated  Patient    Methods  Explanation;Demonstration;Tactile cues;Verbal cues;Handout    Comprehension  Verbal cues required;Returned demonstration;Verbalized understanding       OT Short Term Goals - 05/15/19 1622      OT SHORT TERM GOAL #1   Title  Pt to be independent in HEP to decrease edema , pain and increase AROM in flexion of L hand digits    Baseline  no lknowledge of HEP    Time  3    Period  Weeks    Status  New    Target Date  06/05/19      OT  SHORT TERM GOAL #2   Title  Pain on PRWHE improve with more than 15 points    Baseline  at eval PRWHE score 27/50    Time  4    Period  Weeks    Status  New    Target Date  06/12/19        OT Long Term Goals - 05/20/19 1410      OT LONG TERM GOAL #1   Period  Weeks    Status  Revised            Plan - 06/20/19 0933    Clinical Impression Statement  Pt is about 6 wks s/p L 4th digit trigger finger release and I& D abscess week lateral  - pt cont to show decrease PIP flexion and composite - this dtae -35 coming in -REINFORCE again for pt to do every hour to 2 hrs PROM - composite flexion touching palm and wear buddy strap if can inbetween - cont scar massage and  compression for edema    OT Occupational Profile and History  Problem Focused Assessment - Including review of records relating to presenting problem    Occupational performance deficits (Please refer to evaluation for details):  ADL's;IADL's;Work;Play;Leisure    Body Structure / Function / Physical Skills  ADL;Decreased knowledge of precautions;Flexibility;ROM;UE functional use;Scar mobility;Skin integrity;IADL;Pain;Strength;Edema;FMC    Rehab Potential  Good    Clinical Decision Making  Limited treatment options, no task modification necessary    Comorbidities Affecting Occupational Performance:  May have comorbidities impacting occupational performance    Modification or Assistance to Complete Evaluation   No modification of tasks or assist necessary to complete eval    OT Frequency  1x / week    OT Duration  2 weeks    OT Treatment/Interventions  Self-care/ADL training;Therapeutic exercise;Patient/family education;Splinting;Paraffin;Fluidtherapy;Contrast Bath;Manual Therapy;Passive range of motion;Scar mobilization    Plan  assess progress in scar tissue , PROM , and edema    OT Home Exercise Plan  see pt instruction    Consulted and Agree with Plan of Care  Patient       Patient will benefit from skilled  therapeutic intervention in order to improve the following deficits and impairments:   Body Structure / Function / Physical Skills: ADL, Decreased knowledge of precautions, Flexibility, ROM, UE functional use, Scar mobility, Skin integrity, IADL, Pain, Strength, Edema, FMC       Visit Diagnosis: Localized edema  Pain in left hand  Muscle weakness (generalized)  Stiffness of left hand, not elsewhere classified    Problem List Patient Active Problem List   Diagnosis Date Noted  . CAD (coronary artery disease) 07/05/2018  . Diabetes ([place]) 07/05/2018  . Essential hypertension 07/05/2018  . History of stroke 07/05/2018  . Carotid stenosis 07/05/2018  . Confusion   . Altered mental status 08/08/2017    Rosalyn Gess OTR/l,CLT 06/20/2019, 9:58 AM  [place] [facility] [address] [place], [address] Phone: [phone]   Fax:  [phone]  Name: Thomas Moore MRN: [id number] Date of Birth: December 07, 1955

## 2019-06-27 ENCOUNTER — Ambulatory Visit: Payer: BC Managed Care – PPO | Admitting: Occupational Therapy

## 2019-07-08 ENCOUNTER — Ambulatory Visit (INDEPENDENT_AMBULATORY_CARE_PROVIDER_SITE_OTHER): Payer: BC Managed Care – PPO | Admitting: Vascular Surgery

## 2019-07-08 ENCOUNTER — Ambulatory Visit (INDEPENDENT_AMBULATORY_CARE_PROVIDER_SITE_OTHER): Payer: BC Managed Care – PPO

## 2019-07-08 ENCOUNTER — Encounter (INDEPENDENT_AMBULATORY_CARE_PROVIDER_SITE_OTHER): Payer: Self-pay | Admitting: Vascular Surgery

## 2019-07-08 ENCOUNTER — Other Ambulatory Visit: Payer: Self-pay

## 2019-07-08 VITALS — BP 164/80 | HR 69 | Resp 14 | Wt 167.0 lb

## 2019-07-08 DIAGNOSIS — E119 Type 2 diabetes mellitus without complications: Secondary | ICD-10-CM

## 2019-07-08 DIAGNOSIS — I6523 Occlusion and stenosis of bilateral carotid arteries: Secondary | ICD-10-CM

## 2019-07-08 DIAGNOSIS — I10 Essential (primary) hypertension: Secondary | ICD-10-CM

## 2019-07-08 DIAGNOSIS — Z8673 Personal history of transient ischemic attack (TIA), and cerebral infarction without residual deficits: Secondary | ICD-10-CM | POA: Diagnosis not present

## 2019-07-08 DIAGNOSIS — I2510 Atherosclerotic heart disease of native coronary artery without angina pectoris: Secondary | ICD-10-CM | POA: Diagnosis not present

## 2019-07-08 NOTE — Progress Notes (Signed)
MRN : [id number]  Thomas Moore is a 64 y.o. (1955-11-23) male who presents with chief complaint of  Chief Complaint  Patient presents with   Follow-up    ultrasound follow up  .  History of Present Illness: Patient returns in follow-up of his carotid disease.  He is having a lot of issues with his hand.  He had a trigger finger release and carpal tunnel surgery has apparently gone quite poorly in terms of infection and wound healing.  He is scheduled to see his hand surgeon again in the near future.  He may require more surgery.  No focal neurologic symptoms.  No stroke or TIA symptoms since his last visit with a remote history of stroke at this point.  His carotid duplex today shows stable 1 to 39% bilateral carotid artery stenosis.  Current Outpatient Medications  Medication Sig Dispense Refill   aspirin EC 81 MG tablet Take 81 mg by mouth daily.      atorvastatin (LIPITOR) 40 MG tablet Take 40 mg by mouth daily.      Calcium Carbonate-Vitamin D (CALCIUM-CARB 600 + D PO) Take by mouth.     divalproex (DEPAKOTE) 250 MG DR tablet      furosemide (LASIX) 80 MG tablet Take 80 mg by mouth daily.      glipiZIDE (GLUCOTROL XL) 10 MG 24 hr tablet Take 10 mg by mouth 2 (two) times daily.      ipratropium (ATROVENT) 0.03 % nasal spray Place into the nose.     metFORMIN (GLUCOPHAGE-XR) 500 MG 24 hr tablet Take 1,000 mg by mouth 2 (two) times daily.      metoprolol succinate (TOPROL-XL) 100 MG 24 hr tablet Take 1 tablet by mouth daily.      Multiple Vitamins tablet Take 1 tablet by mouth daily.      omeprazole (PRILOSEC) 40 MG capsule Take 40 mg by mouth daily.      potassium chloride SA (KLOR-CON) 20 MEQ tablet Take 20 mEq by mouth daily.     ramipril (ALTACE) 10 MG capsule Take 10 mg by mouth daily.      sitaGLIPtin (JANUVIA) 50 MG tablet Take 1 tablet by mouth  daily     tiZANidine (ZANAFLEX) 4 MG tablet Take by mouth.     triamcinolone cream (KENALOG) 0.1 %       TRULICITY 0.75 MG/0.5ML SOPN Inject 0.75 mg into the skin every 7 (seven) days.     warfarin (COUMADIN) 1 MG tablet Take 0.5 mg by mouth daily.      warfarin (COUMADIN) 6 MG tablet Take 6 mg by mouth daily.      albuterol (PROVENTIL HFA;VENTOLIN HFA) 108 (90 Base) MCG/ACT inhaler Inhale 2 puffs into the lungs every 6 (six) hours as needed for wheezing.     gabapentin (NEURONTIN) 300 MG capsule Take 600 mg by mouth 2 (two) times daily.      nitroGLYCERIN (NITROSTAT) 0.4 MG SL tablet Place 0.4 mg under the tongue every 5 (five) minutes as needed for chest pain. May take up to 3 doses     sulfamethoxazole-trimethoprim (BACTRIM) 400-80 MG tablet Take 1 tablet by mouth 2 (two) times daily.     No current facility-administered medications for this visit.    Past Medical History:  Diagnosis Date   Coronary artery disease    Diabetes mellitus without complication (HCC)    History of single vessel coronary artery bypass    Hypertension     Past  Surgical History:  Procedure Laterality Date   CORNEAL TRANSPLANT Right    INCISION AND DRAINAGE ABSCESS Left 05/08/2019   Procedure: INCISION AND DRAINAGE ABSCESS;  Surgeon: Dereck Leep, MD;  Location: [facility] ORS;  Service: Orthopedics;  Laterality: Left;     Social History   Tobacco Use   Smoking status: Former Smoker    Types: Cigarettes   Smokeless tobacco: Never Used  Substance Use Topics   Alcohol use: No    Alcohol/week: 0.0 standard drinks   Drug use: No      Family History  Problem Relation Age of Onset   Seizures Brother   Brother with stroke and cancer Sister with stroke and brain tumor No bleeding or clotting disorders  No Known Allergies   REVIEW OF SYSTEMS (Negative unless checked)  Constitutional: [] ?Weight loss  [] ?Fever  [] ?Chills Cardiac: [] ?Chest pain   [] ?Chest pressure   [x] ?Palpitations   [] ?Shortness of breath when laying flat   [] ?Shortness of breath at rest   [] ?Shortness of breath with  exertion. Vascular:  [] ?Pain in legs with walking   [] ?Pain in legs at rest   [] ?Pain in legs when laying flat   [] ?Claudication   [] ?Pain in feet when walking  [] ?Pain in feet at rest  [] ?Pain in feet when laying flat   [] ?History of DVT   [] ?Phlebitis   [] ?Swelling in legs   [] ?Varicose veins   [] ?Non-healing ulcers Pulmonary:   [] ?Uses home oxygen   [] ?Productive cough   [] ?Hemoptysis   [] ?Wheeze  [] ?COPD   [] ?Asthma Neurologic:  [] ?Dizziness  [] ?Blackouts   [x] ?Seizures   [x] ?History of stroke   [] ?History of TIA  [] ?Aphasia   [] ?Temporary blindness   [] ?Dysphagia   [] ?Weakness or numbness in arms   [] ?Weakness or numbness in legs Musculoskeletal:  [] ?Arthritis   [] ?Joint swelling   [] ?Joint pain   [] ?Low back pain Hematologic:  [] ?Easy bruising  [] ?Easy bleeding   [] ?Hypercoagulable state   [] ?Anemic  [] ?Hepatitis Gastrointestinal:  [] ?Blood in stool   [] ?Vomiting blood  [] ?Gastroesophageal reflux/heartburn   [] ?Abdominal pain Genitourinary:  [] ?Chronic kidney disease   [] ?Difficult urination  [] ?Frequent urination  [] ?Burning with urination   [] ?Hematuria Skin:  [] ?Rashes   [x] ?Ulcers   [x] ?Wounds Psychological:  [] ?History of anxiety   [] ? History of major depression.  Physical Examination  Vitals:   07/08/19 0826  BP: (!) 164/80  Pulse: 69  Resp: 14  Weight: 167 lb (75.8 kg)   Body mass index is 29.58 kg/m. Gen:  WD/WN, NAD Head: [place]/AT, No temporalis wasting. Ear/Nose/Throat: Hearing grossly intact, nares w/o erythema or drainage, trachea midline Eyes: Conjunctiva clear. Sclera non-icteric Neck: Supple.  No bruit  Pulmonary:  Good air movement, equal and clear to auscultation bilaterally.  Cardiac: RRR, No JVD Vascular:  Vessel Right Left  Radial Palpable Palpable           Musculoskeletal: M/S 5/5 throughout.  No deformity or atrophy. Neurologic: CN 2-12 intact. Sensation grossly intact in extremities.  Symmetrical.  Speech is fluent. Motor exam as listed  above. Psychiatric: Judgment intact, Mood & affect appropriate for pt's clinical situation. Dermatologic: No rashes or ulcers noted.  No cellulitis or open wounds.      CBC Lab Results  Component Value Date   WBC 6.5 03/18/2018   HGB 12.0 (L) 03/18/2018   HCT 38.1 (L) 03/18/2018   MCV 90.9 03/18/2018   PLT 234 03/18/2018    BMET    Component Value Date/Time   NA 138  03/18/2018 1011   K 4.4 03/18/2018 1011   CL 103 03/18/2018 1011   CO2 26 03/18/2018 1011   GLUCOSE 183 (H) 03/18/2018 1011   BUN 16 03/18/2018 1011   CREATININE 1.01 03/18/2018 1011   CALCIUM 9.2 03/18/2018 1011   GFRNONAA >60 03/18/2018 1011   GFRAA >60 03/18/2018 1011   CrCl cannot be calculated (Patient's most recent lab result is older than the maximum 21 days allowed.).  COAG Lab Results  Component Value Date   INR 1.7 (H) 05/08/2019   INR 3.39 03/18/2018   INR 3.02 03/16/2018    Radiology No results found.   Assessment/Plan CAD (coronary artery disease) Status post coronary artery bypass graft without any current worrisome symptoms.  Diabetes (HCC) blood glucose control important in reducing the progression of atherosclerotic disease. Also, involved in wound healing. On appropriate medications.   Essential hypertension blood pressure control important in reducing the progression of atherosclerotic disease. On appropriate oral medications.   History of stroke Etiology not entirely clear but his mild carotid disease would lean against this being from carotid disease.  Carotid stenosis His carotid duplex today shows stable 1 to 39% bilateral carotid artery stenosis.  No role for intervention at this level.  Recheck in 1 year.    Festus Barren, MD  07/08/2019 9:28 AM    This note was created with Dragon medical transcription system.  Any errors from dictation are purely unintentional

## 2019-07-08 NOTE — Assessment & Plan Note (Signed)
His carotid duplex today shows stable 1 to 39% bilateral carotid artery stenosis.  No role for intervention at this level.  Recheck in 1 year.

## 2019-07-09 ENCOUNTER — Other Ambulatory Visit: Payer: Self-pay

## 2019-07-09 ENCOUNTER — Other Ambulatory Visit: Payer: Self-pay | Admitting: Orthopedic Surgery

## 2019-07-09 ENCOUNTER — Other Ambulatory Visit
Admission: RE | Admit: 2019-07-09 | Discharge: 2019-07-09 | Disposition: A | Discharge status: Home / Self Care | Payer: BC Managed Care – PPO | Source: Ambulatory Visit | Attending: Orthopedic Surgery | Admitting: Orthopedic Surgery

## 2019-07-09 ENCOUNTER — Encounter
Admission: RE | Admit: 2019-07-09 | Discharge: 2019-07-09 | Disposition: A | Discharge status: Home / Self Care | Payer: BC Managed Care – PPO | Source: Ambulatory Visit | Attending: Orthopedic Surgery | Admitting: Orthopedic Surgery

## 2019-07-09 DIAGNOSIS — Z01812 Encounter for preprocedural laboratory examination: Secondary | ICD-10-CM | POA: Diagnosis present

## 2019-07-09 DIAGNOSIS — Z20822 Contact with and (suspected) exposure to covid-19: Secondary | ICD-10-CM | POA: Diagnosis not present

## 2019-07-09 HISTORY — DX: Gastro-esophageal reflux disease without esophagitis: K21.9

## 2019-07-09 LAB — SARS CORONAVIRUS 2 (TAT 6-24 HRS): SARS Coronavirus 2: NEGATIVE

## 2019-07-09 NOTE — Patient Instructions (Signed)
Your procedure is scheduled on: 07/11/19 Report to DAY SURGERY DEPARTMENT LOCATED ON 2ND FLOOR MEDICAL MALL ENTRANCE. To find out your arrival time please call [phone] between 1PM - 3PM on 07/10/19.  Remember: Instructions that are not followed completely may result in serious medical risk, up to and including death, or upon the discretion of your surgeon and anesthesiologist your surgery may need to be rescheduled.     _X__ 1. Do not eat food after midnight the night before your procedure.                 No gum chewing or hard candies. You may drink clear liquids up to 2 hours                 before you are scheduled to arrive for your surgery- DO not drink clear                 liquids within 2 hours of the start of your surgery.                 Clear Liquids include:  water, apple juice without pulp, clear carbohydrate                 drink such as Clearfast or Gatorade, Black Coffee or Tea (Do not add                 anything to coffee or tea). Diabetics water only  __X__2.  On the morning of surgery brush your teeth with toothpaste and water, you                 may rinse your mouth with mouthwash if you wish.  Do not swallow any              toothpaste of mouthwash.     _X__ 3.  No Alcohol for 24 hours before or after surgery.   _X__ 4.  Do Not Smoke or use e-cigarettes For 24 Hours Prior to Your Surgery.                 Do not use any chewable tobacco products for at least 6 hours prior to                 surgery.  ____  5.  Bring all medications with you on the day of surgery if instructed.   __X__  6.  Notify your doctor if there is any change in your medical condition      (cold, fever, infections).     Do not wear jewelry, make-up, hairpins, clips or nail polish. Do not wear lotions, powders, or perfumes.  Do not shave 48 hours prior to surgery. Men may shave face and neck. Do not bring valuables to the hospital.    [facility] is not responsible for any belongings or  valuables.  Contacts, dentures/partials or body piercings may not be worn into surgery. Bring a case for your contacts, glasses or hearing aids, a denture cup will be supplied. Leave your suitcase in the car. After surgery it may be brought to your room. For patients admitted to the hospital, discharge time is determined by your treatment team.   Patients discharged the day of surgery will not be allowed to drive home.   Please read over the following fact sheets that you were given:   MRSA Information  __X__ Take these medicines the morning of surgery with A SIP OF WATER:  1. dIVALPROEX  2. METOPROLOL  3. OMEPRAZOLE  4.  5.  6.  ____ Fleet Enema (as directed)   ____ Use CHG Soap/SAGE wipes as directed  ____ Use inhalers on the day of surgery  ____ Stop metformin/Janumet/Farxiga 2 days prior to surgery    ____ Take 1/2 of usual insulin dose the night before surgery. No insulin the morning          of surgery.   __X__ Stop Blood Thinners Coumadin/Plavix/Xarelto/Pleta/Pradaxa/Eliquis/Effient/Aspirin  on   Or contact your Surgeon, Cardiologist or Medical Doctor regarding  ability to stop your blood thinners  __X__ Stop Anti-inflammatories 7 days before surgery such as Advil, Ibuprofen, Motrin,  BC or Goodies Powder, Naprosyn, Naproxen, Aleve, Aspirin    __X__ Stop all herbal supplements, fish oil or vitamin E until after surgery.    ____ Bring C-Pap to the hospital.

## 2019-07-10 ENCOUNTER — Encounter
Admission: RE | Admit: 2019-07-10 | Discharge: 2019-07-10 | Disposition: A | Discharge status: Home / Self Care | Payer: BC Managed Care – PPO | Source: Ambulatory Visit | Attending: Orthopedic Surgery | Admitting: Orthopedic Surgery

## 2019-07-10 DIAGNOSIS — Z01812 Encounter for preprocedural laboratory examination: Secondary | ICD-10-CM | POA: Insufficient documentation

## 2019-07-10 DIAGNOSIS — Z01810 Encounter for preprocedural cardiovascular examination: Secondary | ICD-10-CM | POA: Diagnosis present

## 2019-07-10 LAB — CBC
HCT: 39.9 % (ref 39.0–52.0)
Hemoglobin: 12.6 g/dL — ABNORMAL LOW (ref 13.0–17.0)
MCH: 29.3 pg (ref 26.0–34.0)
MCHC: 31.6 g/dL (ref 30.0–36.0)
MCV: 92.8 fL (ref 80.0–100.0)
Platelets: 196 10*3/uL (ref 150–400)
RBC: 4.3 MIL/uL (ref 4.22–5.81)
RDW: 14.3 % (ref 11.5–15.5)
WBC: 6.6 10*3/uL (ref 4.0–10.5)
nRBC: 0 % (ref 0.0–0.2)

## 2019-07-10 LAB — BASIC METABOLIC PANEL
Anion gap: 9 (ref 5–15)
BUN: 19 mg/dL (ref 8–23)
CO2: 27 mmol/L (ref 22–32)
Calcium: 8.8 mg/dL — ABNORMAL LOW (ref 8.9–10.3)
Chloride: 106 mmol/L (ref 98–111)
Creatinine, Ser: 1.04 mg/dL (ref 0.61–1.24)
GFR calc Af Amer: >60 mL/min (ref >60)
GFR calc non Af Amer: >60 mL/min (ref >60)
Glucose, Bld: 151 mg/dL — ABNORMAL HIGH (ref 70–99)
Potassium: 4.6 mmol/L (ref 3.5–5.1)
Sodium: 142 mmol/L (ref 135–145)

## 2019-07-11 ENCOUNTER — Ambulatory Visit: Payer: BC Managed Care – PPO | Admitting: Anesthesiology

## 2019-07-11 ENCOUNTER — Encounter
Admission: RE | Disposition: A | Discharge status: Home / Self Care | Payer: Self-pay | Source: Home / Self Care | Attending: Orthopedic Surgery

## 2019-07-11 ENCOUNTER — Ambulatory Visit
Admission: RE | Admit: 2019-07-11 | Discharge: 2019-07-11 | Disposition: A | Discharge status: Home / Self Care | Payer: BC Managed Care – PPO | Attending: Orthopedic Surgery | Admitting: Orthopedic Surgery

## 2019-07-11 ENCOUNTER — Encounter: Payer: Self-pay | Admitting: Orthopedic Surgery

## 2019-07-11 DIAGNOSIS — Z801 Family history of malignant neoplasm of trachea, bronchus and lung: Secondary | ICD-10-CM | POA: Insufficient documentation

## 2019-07-11 DIAGNOSIS — X58XXXA Exposure to other specified factors, initial encounter: Secondary | ICD-10-CM | POA: Insufficient documentation

## 2019-07-11 DIAGNOSIS — Z86711 Personal history of pulmonary embolism: Secondary | ICD-10-CM | POA: Diagnosis not present

## 2019-07-11 DIAGNOSIS — Z87442 Personal history of urinary calculi: Secondary | ICD-10-CM | POA: Diagnosis not present

## 2019-07-11 DIAGNOSIS — Z8249 Family history of ischemic heart disease and other diseases of the circulatory system: Secondary | ICD-10-CM | POA: Insufficient documentation

## 2019-07-11 DIAGNOSIS — E7800 Pure hypercholesterolemia, unspecified: Secondary | ICD-10-CM | POA: Diagnosis not present

## 2019-07-11 DIAGNOSIS — Z8261 Family history of arthritis: Secondary | ICD-10-CM | POA: Diagnosis not present

## 2019-07-11 DIAGNOSIS — Z79899 Other long term (current) drug therapy: Secondary | ICD-10-CM | POA: Insufficient documentation

## 2019-07-11 DIAGNOSIS — M67844 Other specified disorders of tendon, left hand: Secondary | ICD-10-CM | POA: Insufficient documentation

## 2019-07-11 DIAGNOSIS — E669 Obesity, unspecified: Secondary | ICD-10-CM | POA: Insufficient documentation

## 2019-07-11 DIAGNOSIS — M79646 Pain in unspecified finger(s): Secondary | ICD-10-CM | POA: Diagnosis present

## 2019-07-11 DIAGNOSIS — Z7984 Long term (current) use of oral hypoglycemic drugs: Secondary | ICD-10-CM | POA: Insufficient documentation

## 2019-07-11 DIAGNOSIS — Z7901 Long term (current) use of anticoagulants: Secondary | ICD-10-CM | POA: Insufficient documentation

## 2019-07-11 DIAGNOSIS — M1990 Unspecified osteoarthritis, unspecified site: Secondary | ICD-10-CM | POA: Diagnosis not present

## 2019-07-11 DIAGNOSIS — R011 Cardiac murmur, unspecified: Secondary | ICD-10-CM | POA: Insufficient documentation

## 2019-07-11 DIAGNOSIS — Z6829 Body mass index (BMI) 29.0-29.9, adult: Secondary | ICD-10-CM | POA: Insufficient documentation

## 2019-07-11 DIAGNOSIS — Z820 Family history of epilepsy and other diseases of the nervous system: Secondary | ICD-10-CM | POA: Insufficient documentation

## 2019-07-11 DIAGNOSIS — K219 Gastro-esophageal reflux disease without esophagitis: Secondary | ICD-10-CM | POA: Diagnosis not present

## 2019-07-11 DIAGNOSIS — I10 Essential (primary) hypertension: Secondary | ICD-10-CM | POA: Insufficient documentation

## 2019-07-11 DIAGNOSIS — Z7982 Long term (current) use of aspirin: Secondary | ICD-10-CM | POA: Insufficient documentation

## 2019-07-11 DIAGNOSIS — Z87891 Personal history of nicotine dependence: Secondary | ICD-10-CM | POA: Insufficient documentation

## 2019-07-11 DIAGNOSIS — Z808 Family history of malignant neoplasm of other organs or systems: Secondary | ICD-10-CM | POA: Diagnosis not present

## 2019-07-11 DIAGNOSIS — M6289 Other specified disorders of muscle: Secondary | ICD-10-CM | POA: Diagnosis not present

## 2019-07-11 DIAGNOSIS — I2510 Atherosclerotic heart disease of native coronary artery without angina pectoris: Secondary | ICD-10-CM | POA: Insufficient documentation

## 2019-07-11 DIAGNOSIS — Y9389 Activity, other specified: Secondary | ICD-10-CM | POA: Diagnosis not present

## 2019-07-11 DIAGNOSIS — Z951 Presence of aortocoronary bypass graft: Secondary | ICD-10-CM | POA: Diagnosis not present

## 2019-07-11 DIAGNOSIS — Z823 Family history of stroke: Secondary | ICD-10-CM | POA: Insufficient documentation

## 2019-07-11 DIAGNOSIS — S66115A Strain of flexor muscle, fascia and tendon of left ring finger at wrist and hand level, initial encounter: Secondary | ICD-10-CM | POA: Diagnosis not present

## 2019-07-11 DIAGNOSIS — Z8614 Personal history of Methicillin resistant Staphylococcus aureus infection: Secondary | ICD-10-CM | POA: Insufficient documentation

## 2019-07-11 DIAGNOSIS — E119 Type 2 diabetes mellitus without complications: Secondary | ICD-10-CM | POA: Insufficient documentation

## 2019-07-11 DIAGNOSIS — K449 Diaphragmatic hernia without obstruction or gangrene: Secondary | ICD-10-CM | POA: Insufficient documentation

## 2019-07-11 HISTORY — PX: FLEXOR TENDON REPAIR: SHX6501

## 2019-07-11 LAB — PROTIME-INR
INR: 1.1 (ref 0.8–1.2)
Prothrombin Time: 14.2 seconds (ref 11.4–15.2)

## 2019-07-11 LAB — GLUCOSE, CAPILLARY
Glucose-Capillary: 90 mg/dL (ref 70–99)
Glucose-Capillary: 88 mg/dL (ref 70–99)

## 2019-07-11 SURGERY — REPAIR, TENDON, FLEXOR
Anesthesia: General | Laterality: Left

## 2019-07-11 MED ORDER — LIDOCAINE-EPINEPHRINE 1 %-1:100000 IJ SOLN
INTRAMUSCULAR | Status: DC | PRN
  Administered 2019-07-11: 20 mL

## 2019-07-11 MED ORDER — DEXAMETHASONE SODIUM PHOSPHATE 10 MG/ML IJ SOLN
INTRAMUSCULAR | Status: AC
  Filled 2019-07-11: qty 1

## 2019-07-11 MED ORDER — PROPOFOL 10 MG/ML IV BOLUS
INTRAVENOUS | Status: AC
  Filled 2019-07-11: qty 20

## 2019-07-11 MED ORDER — OXYCODONE HCL 5 MG/5ML PO SOLN: 5.0000 mg | Freq: Once | ORAL | Status: DC | PRN

## 2019-07-11 MED ORDER — LIDOCAINE HCL (PF) 2 % IJ SOLN
INTRAMUSCULAR | Status: AC
  Filled 2019-07-11: qty 10

## 2019-07-11 MED ORDER — MIDAZOLAM HCL 2 MG/2ML IJ SOLN
INTRAMUSCULAR | Status: DC | PRN
  Administered 2019-07-11: 2 mg via INTRAVENOUS

## 2019-07-11 MED ORDER — BUPIVACAINE HCL (PF) 0.5 % IJ SOLN
INTRAMUSCULAR | Status: AC
  Filled 2019-07-11: qty 30

## 2019-07-11 MED ORDER — CHLORHEXIDINE GLUCONATE 4 % EX LIQD
60.0000 mL | Freq: Once | CUTANEOUS | Status: AC
  Administered 2019-07-11: 4 via TOPICAL

## 2019-07-11 MED ORDER — FENTANYL CITRATE (PF) 100 MCG/2ML IJ SOLN: 25.0000 ug | INTRAMUSCULAR | Status: DC | PRN

## 2019-07-11 MED ORDER — FENTANYL CITRATE (PF) 100 MCG/2ML IJ SOLN
INTRAMUSCULAR | Status: DC | PRN
  Administered 2019-07-11: 50 ug via INTRAVENOUS
  Administered 2019-07-11 (×2): 25 ug via INTRAVENOUS

## 2019-07-11 MED ORDER — EPHEDRINE SULFATE 50 MG/ML IJ SOLN
INTRAMUSCULAR | Status: DC | PRN
  Administered 2019-07-11: 10 mg via INTRAVENOUS

## 2019-07-11 MED ORDER — ACETAMINOPHEN 10 MG/ML IV SOLN
INTRAVENOUS | Status: DC | PRN
  Administered 2019-07-11: 1000 mg via INTRAVENOUS

## 2019-07-11 MED ORDER — FENTANYL CITRATE (PF) 100 MCG/2ML IJ SOLN
INTRAMUSCULAR | Status: AC
  Filled 2019-07-11: qty 2

## 2019-07-11 MED ORDER — ACETAMINOPHEN 10 MG/ML IV SOLN
INTRAVENOUS | Status: AC
  Filled 2019-07-11: qty 100

## 2019-07-11 MED ORDER — SODIUM CHLORIDE 0.9 % IV SOLN: INTRAVENOUS | Status: DC

## 2019-07-11 MED ORDER — ONDANSETRON HCL 4 MG/2ML IJ SOLN
INTRAMUSCULAR | Status: DC | PRN
  Administered 2019-07-11: 4 mg via INTRAVENOUS

## 2019-07-11 MED ORDER — EPINEPHRINE PF 1 MG/ML IJ SOLN
INTRAMUSCULAR | Status: AC
  Filled 2019-07-11: qty 1

## 2019-07-11 MED ORDER — CEFAZOLIN SODIUM-DEXTROSE 2-4 GM/100ML-% IV SOLN
2.0000 g | INTRAVENOUS | Status: AC
  Administered 2019-07-11: 12:00:00 2 g via INTRAVENOUS

## 2019-07-11 MED ORDER — NEOMYCIN-POLYMYXIN B GU 40-200000 IR SOLN
Status: AC
  Filled 2019-07-11: qty 20

## 2019-07-11 MED ORDER — NEOMYCIN-POLYMYXIN B GU 40-200000 IR SOLN
Status: DC | PRN
  Administered 2019-07-11: 2 mL

## 2019-07-11 MED ORDER — DEXAMETHASONE SODIUM PHOSPHATE 10 MG/ML IJ SOLN
INTRAMUSCULAR | Status: DC | PRN
  Administered 2019-07-11 (×2): 10 mg via INTRAVENOUS

## 2019-07-11 MED ORDER — OXYCODONE HCL 5 MG PO TABS: 5.0000 mg | ORAL_TABLET | Freq: Once | ORAL | Status: DC | PRN

## 2019-07-11 MED ORDER — PROPOFOL 10 MG/ML IV BOLUS
INTRAVENOUS | Status: DC | PRN
  Administered 2019-07-11: 50 mg via INTRAVENOUS
  Administered 2019-07-11: 100 mg via INTRAVENOUS

## 2019-07-11 MED ORDER — SUCCINYLCHOLINE CHLORIDE 20 MG/ML IJ SOLN
INTRAMUSCULAR | Status: DC | PRN
  Administered 2019-07-11: 100 mg via INTRAVENOUS

## 2019-07-11 MED ORDER — PHENYLEPHRINE HCL (PRESSORS) 10 MG/ML IV SOLN
INTRAVENOUS | Status: DC | PRN
  Administered 2019-07-11: 50 ug via INTRAVENOUS

## 2019-07-11 MED ORDER — BUPIVACAINE HCL (PF) 0.5 % IJ SOLN
INTRAMUSCULAR | Status: DC | PRN
  Administered 2019-07-11: 10 mL

## 2019-07-11 MED ORDER — HYDROCODONE-ACETAMINOPHEN 5-325 MG PO TABS: 1.0000 | ORAL_TABLET | Freq: Four times a day (QID) | ORAL | 0 refills | Status: DC | PRN

## 2019-07-11 MED ORDER — MIDAZOLAM HCL 2 MG/2ML IJ SOLN
INTRAMUSCULAR | Status: AC
  Filled 2019-07-11: qty 2

## 2019-07-11 MED ORDER — LIDOCAINE HCL (PF) 1 % IJ SOLN
INTRAMUSCULAR | Status: AC
  Filled 2019-07-11: qty 30

## 2019-07-11 MED ORDER — CEFAZOLIN SODIUM-DEXTROSE 2-4 GM/100ML-% IV SOLN
INTRAVENOUS | Status: AC
  Filled 2019-07-11: qty 100

## 2019-07-11 SURGICAL SUPPLY — 55 items
APL PRP STRL LF DISP 70% ISPRP (MISCELLANEOUS) ×1
BNDG CMPR STD VLCR NS LF 5.8X4 (GAUZE/BANDAGES/DRESSINGS) ×1
BNDG ELASTIC 4X5.8 VLCR NS LF (GAUZE/BANDAGES/DRESSINGS) ×2 IMPLANT
BNDG ESMARK 4X12 TAN STRL LF (GAUZE/BANDAGES/DRESSINGS) ×2 IMPLANT
CANISTER SUCT 1200ML W/VALVE (MISCELLANEOUS) ×2 IMPLANT
CHLORAPREP W/TINT 26 (MISCELLANEOUS) ×2 IMPLANT
COVER WAND RF STERILE (DRAPES) ×2 IMPLANT
CUFF DUAL TOURNIQUET 18IN DISP (TOURNIQUET CUFF) IMPLANT
CUFF TOURN 24 STER (MISCELLANEOUS) IMPLANT
CUFF TOURN DUAL PL 12 NO SLV (MISCELLANEOUS) ×1 IMPLANT
DRSG GAUZE FLUFF 36X18 (GAUZE/BANDAGES/DRESSINGS) ×2 IMPLANT
ELECT CAUTERY BLADE 6.4 (BLADE) ×2 IMPLANT
ELECT CAUTERY NEEDLE TIP 1.0 (MISCELLANEOUS) ×2
ELECT REM PT RETURN 9FT ADLT (ELECTROSURGICAL) ×2
ELECTRODE CAUTERY NEDL TIP 1.0 (MISCELLANEOUS) IMPLANT
ELECTRODE REM PT RTRN 9FT ADLT (ELECTROSURGICAL) ×1 IMPLANT
GAUZE SPONGE 4X4 12PLY STRL (GAUZE/BANDAGES/DRESSINGS) ×2 IMPLANT
GAUZE XEROFORM 1X8 LF (GAUZE/BANDAGES/DRESSINGS) ×3 IMPLANT
GLOVE BIOGEL PI IND STRL 9 (GLOVE) ×1 IMPLANT
GLOVE BIOGEL PI INDICATOR 9 (GLOVE) ×1
GLOVE SURG SYN 9.0  PF PI (GLOVE) ×1
GLOVE SURG SYN 9.0 PF PI (GLOVE) ×1 IMPLANT
GOWN SRG 2XL LVL 4 RGLN SLV (GOWNS) ×1 IMPLANT
GOWN STRL NON-REIN 2XL LVL4 (GOWNS) ×2
GOWN STRL REUS W/ TWL LRG LVL3 (GOWN DISPOSABLE) ×1 IMPLANT
GOWN STRL REUS W/TWL LRG LVL3 (GOWN DISPOSABLE) ×2
KIT TURNOVER KIT A (KITS) ×2 IMPLANT
LOOP RED MAXI  1X406MM (MISCELLANEOUS) ×1
LOOP VESSEL MAXI 1X406 RED (MISCELLANEOUS) IMPLANT
NDL HYPO 25X1 1.5 SAFETY (NEEDLE) ×1 IMPLANT
NDL SAFETY ECLIPSE 18X1.5 (NEEDLE) IMPLANT
NEEDLE HYPO 18GX1.5 SHARP (NEEDLE) ×2
NEEDLE HYPO 25X1 1.5 SAFETY (NEEDLE) ×4 IMPLANT
NS IRRIG 500ML POUR BTL (IV SOLUTION) ×2 IMPLANT
PACK EXTREMITY ARMC (MISCELLANEOUS) ×2 IMPLANT
PAD CAST CTTN 4X4 STRL (SOFTGOODS) ×1 IMPLANT
PADDING CAST COTTON 4X4 STRL (SOFTGOODS) ×4
RETRIEVER SUT HEWSON (MISCELLANEOUS) ×1 IMPLANT
SPLINT CAST 1 STEP 3X12 (MISCELLANEOUS) ×1 IMPLANT
SPLINT CAST 1 STEP 4X15 (MISCELLANEOUS) ×1 IMPLANT
STOCKINETTE 48X4 2 PLY STRL (GAUZE/BANDAGES/DRESSINGS) ×1 IMPLANT
STOCKINETTE STRL 4IN 9604848 (GAUZE/BANDAGES/DRESSINGS) ×2 IMPLANT
SUT ETHIBOND 3 0 SH 1 (SUTURE) ×1 IMPLANT
SUT ETHILON 4 0 P 3 18 (SUTURE) ×4 IMPLANT
SUT ETHILON 4-0 (SUTURE) ×2
SUT ETHILON 4-0 FS2 18XMFL BLK (SUTURE) ×1
SUT ETHILON 5 0 P 3 18 (SUTURE)
SUT MERSILENE 4-0 WHT RB-1 (SUTURE) ×2 IMPLANT
SUT MNCRL 4-0 (SUTURE) ×2
SUT MNCRL 4-0 27XMFL (SUTURE) ×1
SUT NYLON ETHILON 5-0 P-3 1X18 (SUTURE) ×1 IMPLANT
SUTURE ETHLN 4-0 FS2 18XMF BLK (SUTURE) ×1 IMPLANT
SUTURE MNCRL 4-0 27XMF (SUTURE) IMPLANT
SYR 10ML LL (SYRINGE) ×2 IMPLANT
TOWEL OR 17X26 4PK STRL BLUE (TOWEL DISPOSABLE) ×1 IMPLANT

## 2019-07-11 NOTE — Discharge Instructions (Addendum)
Keep arm elevated is much as possible this weekend.  At surgery we found your tendon had ruptured and so we had to repair it and splint the finger and back of the hand so you will be able to work on finger motion at the present time.  Pain medicine has been sent to [facility].  Take as directed.  AMBULATORY SURGERY  DISCHARGE INSTRUCTIONS   1) The drugs that you were given will stay in your system until tomorrow so for the next 24 hours you should not:  A) Drive an automobile B) Make any legal decisions C) Drink any alcoholic beverage   2) You may resume regular meals tomorrow.  Today it is better to start with liquids and gradually work up to solid foods.  You may eat anything you prefer, but it is better to start with liquids, then soup and crackers, and gradually work up to solid foods.   3) Please notify your doctor immediately if you have any unusual bleeding, trouble breathing, redness and pain at the surgery site, drainage, fever, or pain not relieved by medication.    4) Additional Instructions:        Please contact your physician with any problems or Same Day Surgery at [phone], Monday through Friday 6 am to 4 pm, or [place] [facility] number at [phone].

## 2019-07-11 NOTE — H&P (Signed)
Reviewed paper H+P, will be scanned into chart. No changes noted.  

## 2019-07-11 NOTE — Anesthesia Procedure Notes (Signed)
Procedure Name: Intubation Date/Time: 07/11/2019 12:41 PM Performed by: Manning Charity, CRNA Pre-anesthesia Checklist: Patient identified, Emergency Drugs available, Suction available and Patient being monitored Patient Re-evaluated:Patient Re-evaluated prior to induction Oxygen Delivery Method: Circle system utilized Preoxygenation: Pre-oxygenation with 100% oxygen Induction Type: IV induction Ventilation: Mask ventilation without difficulty Laryngoscope Size: McGraph and 3 Grade View: Grade I Tube type: Oral Tube size: 7.5 mm Number of attempts: 2 Airway Equipment and Method: Stylet Placement Confirmation: ETT inserted through vocal cords under direct vision,  positive ETCO2 and breath sounds checked- equal and bilateral Secured at: 20 cm Tube secured with: Tape Dental Injury: Teeth and Oropharynx as per pre-operative assessment

## 2019-07-11 NOTE — Anesthesia Preprocedure Evaluation (Addendum)
Anesthesia Evaluation  Patient identified by MRN, date of birth, ID band Patient awake    Reviewed: Allergy & Precautions, H&P , NPO status , Patient's Chart, lab work & pertinent test results  History of Anesthesia Complications Negative for: history of anesthetic complications  Airway Mallampati: II  TM Distance: <3 FB Neck ROM: full    Dental  (+) Teeth Intact   Pulmonary neg COPD, neg recent URI, former smoker,           Cardiovascular hypertension, (-) angina+ CAD and + CABG (1997)  (-) Cardiac Stents (-) dysrhythmias      Neuro/Psych negative neurological ROS  negative psych ROS   GI/Hepatic Neg liver ROS, GERD  ,  Endo/Other  diabetes  Renal/GU      Musculoskeletal   Abdominal   Peds  Hematology negative hematology ROS (+)   Anesthesia Other Findings Past Medical History: No date: Coronary artery disease No date: Diabetes mellitus without complication (HCC) No date: GERD (gastroesophageal reflux disease) No date: History of single vessel coronary artery bypass No date: Hypertension  Past Surgical History: No date: CORNEAL TRANSPLANT; Right No date: CORONARY ARTERY BYPASS GRAFT 05/08/2019: INCISION AND DRAINAGE ABSCESS; Left     Comment:  Procedure: INCISION AND DRAINAGE ABSCESS;  Surgeon:               Dereck Leep, MD;  Location: [facility] ORS;  Service:               Orthopedics;  Laterality: Left;     Reproductive/Obstetrics negative OB ROS                           Anesthesia Physical Anesthesia Plan  ASA: III  Anesthesia Plan: MAC   Post-op Pain Management:    Induction:   PONV Risk Score and Plan: Ondansetron  Airway Management Planned: Natural Airway and Nasal Cannula  Additional Equipment:   Intra-op Plan:   Post-operative Plan:   Informed Consent: I have reviewed the patients History and Physical, chart, labs and discussed the procedure including the  risks, benefits and alternatives for the proposed anesthesia with the patient or authorized representative who has indicated his/her understanding and acceptance.       Plan Discussed with: Anesthesiologist  Anesthesia Plan Comments: (Surgeon requests MAC so that pt can respond to commands during procedure)      Anesthesia Quick Evaluation

## 2019-07-11 NOTE — Op Note (Signed)
07/11/2019  2:07 PM  PATIENT:  Thomas Moore  64 y.o. male  PRE-OPERATIVE DIAGNOSIS:  ADHESION FLEXOR TENDON, finger pain  POST-OPERATIVE DIAGNOSIS:  ADHESION FLEXOR TENDON, flexor tendon rupture ring finger  PROCEDURE:  Procedure(s): FLEXOR tenolysis  REPAIR LEFT RING FINGER with tednon repair (Left) Flexor tendon repair  SURGEON: Leitha Schuller, MD  ASSISTANTS: None  ANESTHESIA:   general  EBL:  Total I/O In: 800 [I.V.:800] Out: -   BLOOD ADMINISTERED:none  DRAINS: none   LOCAL MEDICATIONS USED:  MARCAINE    and LIDOCAINE   SPECIMEN:  No Specimen  DISPOSITION OF SPECIMEN:  N/A  COUNTS:  YES  TOURNIQUET:   Total Tourniquet Time Documented: Forearm (Left) - 67 minutes Total: Forearm (Left) - 67 minutes   IMPLANTS: None  DICTATION: .Dragon Dictation patient was brought to the operating room and after adequate sedation was given appropriate patient identification with timeout procedure carried out and a total of 20 cc 1% Xylocaine with with epinephrine was infiltrated into the area of the planned incision along the distal ring metacarpal and over the proximal and middle phalanges.  The arm was then prepped and draped in the usual sterile fashion with a tourniquet to the proximal forearm.  After patient identification and timeout procedures were completed again zigzag incision was used incorporating the prior incision with exposure of the A1 pulley and proximal phalanx with flexor tendon identified after opening up some residual pulley at the proximal A2 pulley the tendon was found to have a ruptured.  On motion at that by pulling on the tip of the FDS as well as FDP tendons there was some flexor adhesions present and this was released with the use of a Therapist, nutritional with a small window being placed over the proximal proximal phalanx and at the level of the middle phalanx to separate the FDS from the FDP tendons with this finding determined that additional surgery would be  required and so the patient was placed under general anesthesia and the tourniquet raised.  Carpal tunnel approach was made with extension approximately 2 cm proximally to expose the carpal tunnel and with this the flexor tendons were identified and were found to have retracted and were adherent to each other in a loop fashion this was separated and with tension this could be brought out to the level of the initial incision that had been made with the index procedure.  There is a some difficulty that the tendons could be passed but they were the left the past was passed through the tunnel and a 3-0 Ethibond suture was woven through and a Bennell type suture to try to get tendon apposition with the finger and wrist flexed tendon apposition was obtained and additional reinforcing suture was placed.  The wrist could be held in a and extension and the finger brought back to approximately 60 degrees of flexion with tendon intact the wound was irrigated this point and the proximal incision was infiltrated with an additional 10 cc of half percent Sensorcaine plain for postop analgesia.  The carpal tunnel incision was repaired using a running 4-0 nylon and the zigzag incision distally was repaired using a running 4-0 Monocryl undyed Monocryl so the sutures would not need to be removed Xeroform was placed over the incisions with 4 x 4's placed between the fingers followed by fluffs web roll and a dorsal splint holding the wrist in slight flexion and the MCPs at 90 degrees flexion there is minimal bleeding at the  close the case and tourniquet is let down prior to dressing patient was then sent to recovery in stable condition  PLAN OF CARE: Discharge to home after PACU  PATIENT DISPOSITION:  PACU - hemodynamically stable.

## 2019-07-11 NOTE — Transfer of Care (Signed)
Immediate Anesthesia Transfer of Care Note  Patient: Thomas Moore  Procedure(s) Performed: FLEXOR tenolysis  REPAIR LEFT RING FINGER with tednon repair (Left )  Patient Location: PACU  Anesthesia Type:General  Level of Consciousness: sedated  Airway & Oxygen Therapy: Patient Spontanous Breathing and Patient connected to face mask oxygen  Post-op Assessment: Report given to RN and Post -op Vital signs reviewed and stable  Post vital signs: Reviewed and stable  Last Vitals:  Vitals Value Taken Time  BP 123/72 07/11/19 1410  Temp 36.3 C 07/11/19 1410  Pulse 81 07/11/19 1410  Resp 12 07/11/19 1410  SpO2 100 % 07/11/19 1410    Last Pain:  Vitals:   07/11/19 1410  TempSrc:   PainSc: 0-No pain         Complications: No apparent anesthesia complications

## 2019-07-11 NOTE — Anesthesia Procedure Notes (Signed)
Date/Time: 07/11/2019 1:24 AM Performed by: Manning Charity, CRNA

## 2019-07-13 NOTE — Anesthesia Postprocedure Evaluation (Signed)
Anesthesia Post Note  Patient: BASIM BARTNIK  Procedure(s) Performed: FLEXOR tenolysis  REPAIR LEFT RING FINGER with tednon repair (Left )  Patient location during evaluation: PACU Anesthesia Type: General Level of consciousness: awake and alert Pain management: pain level controlled Vital Signs Assessment: post-procedure vital signs reviewed and stable Respiratory status: spontaneous breathing, nonlabored ventilation and respiratory function stable Cardiovascular status: blood pressure returned to baseline and stable Postop Assessment: no apparent nausea or vomiting Anesthetic complications: no     Last Vitals:  Vitals:   07/11/19 1447 07/11/19 1456  BP: (!) 154/85 (!) 151/74  Pulse: 89 88  Resp: 16 16  Temp: (!) 36.3 C (!) 36.1 C  SpO2: 92% 93%    Last Pain:  Vitals:   07/11/19 1456  TempSrc: Temporal  PainSc: 0-No pain                 Aurelio Brash Nayab Aten

## 2019-07-14 ENCOUNTER — Ambulatory Visit: Payer: BC Managed Care – PPO | Attending: Orthopedic Surgery | Admitting: Occupational Therapy

## 2019-07-14 ENCOUNTER — Other Ambulatory Visit: Payer: Self-pay

## 2019-07-14 DIAGNOSIS — R600 Localized edema: Secondary | ICD-10-CM | POA: Insufficient documentation

## 2019-07-14 DIAGNOSIS — M6281 Muscle weakness (generalized): Secondary | ICD-10-CM | POA: Insufficient documentation

## 2019-07-14 DIAGNOSIS — M25642 Stiffness of left hand, not elsewhere classified: Secondary | ICD-10-CM | POA: Diagnosis present

## 2019-07-14 DIAGNOSIS — M79642 Pain in left hand: Secondary | ICD-10-CM | POA: Insufficient documentation

## 2019-07-14 NOTE — Therapy (Signed)
[facility] [address], [place], [address] Phone: [phone]   Fax:  [phone]  Occupational Therapy Evaluation  Patient Details  Name: Thomas Moore MRN: [id number] Date of Birth: [birth year] Referring Provider (OT): Cranston Neighbor   Encounter Date: 07/14/2019  OT End of Session - 07/14/19 2056    Visit Number  1    Number of Visits  24    Date for OT Re-Evaluation  10/06/19    OT Start Time  1140    OT Stop Time  1236    OT Time Calculation (Thomas)  56 Thomas    Activity Tolerance  Patient tolerated treatment well    Behavior During Therapy  [facility] for tasks assessed/performed       Past Medical History:  Diagnosis Date  . Coronary artery disease   . Diabetes mellitus without complication (HCC)   . GERD (gastroesophageal reflux disease)   . History of single vessel coronary artery bypass   . Hypertension     Past Surgical History:  Procedure Laterality Date  . CORNEAL TRANSPLANT Right   . CORONARY ARTERY BYPASS GRAFT  1997  . FLEXOR TENDON REPAIR Left 07/11/2019   Procedure: FLEXOR tenolysis  REPAIR LEFT RING FINGER with tednon repair;  Surgeon: Kennedy Bucker, MD;  Location: [facility] ORS;  Service: Orthopedics;  Laterality: Left;  . INCISION AND DRAINAGE ABSCESS Left 05/08/2019   Procedure: INCISION AND DRAINAGE ABSCESS;  Surgeon: Donato Heinz, MD;  Location: [facility] ORS;  Service: Orthopedics;  Laterality: Left;    There were no vitals filed for this visit.  Subjective Assessment - 07/14/19 2036    Subjective   They had my awake at start of surgery so Dr Rosita Kea could see that I can move my finger - but then they had to put me to sleep because they had to do more -this splint is big - I cannot get my jacket on    Pertinent History  History of bilateral CTS but symptoms decrease - pt had L 4th trigger finger that required release on 04/23/2019 by Dr Rosita Kea -but then pt develop infection with I&D abscess  by Dr  Ernest Pine 12/3 - had tendon repair 07/11/2019 after 4th FDP and FDS rupture    Patient Stated Goals  Want to get my hand better so I can do my job    Currently in Pain?  Yes    Pain Score  2     Pain Location  Hand    Pain Orientation  Left    Pain Descriptors / Indicators  Aching;Tightness    Pain Type  Surgical pain    Pain Onset  In the past 7 days    Pain Frequency  Intermittent          fabricated dorsal block splint to L hand -   Keep dorsal block splint on at all times - splint fabricated at wrist flexion 20-30 degrees,MC flexion 70 and PIP extended  5 x day - PROM in splint  pain less than 2/10  PROM DIP flexion and extention to splint  PIP flexion and extentoin PROM in splint And composite flexion and extentoin in splint PROM             OT Education - 07/14/19 2055    Education Details  protocol for flexor tendon repair ,and HEP - splint wearing    Person(s) Educated  Patient    Methods  Explanation;Demonstration;Tactile cues;Verbal cues;Handout  Comprehension  Verbal cues required;Returned demonstration;Verbalized understanding       OT Short Term Goals - 07/14/19 2103      OT SHORT TERM GOAL #1   Title  Pt to be independent with HEP for splint wearing , PROM in splint ,edema control and incision healing    Baseline  no knowledge    Time  3    Period  Weeks    Status  New    Target Date  08/04/19        OT Long Term Goals - 07/14/19 2105      OT LONG TERM GOAL #1   Title  Pt show AROM in L hand to touch palm to use in ADl's    Baseline  Pt only PROM in splint for extention and flexion - 3 days s/p    Time  6    Period  Weeks    Status  New    Target Date  08/25/19      OT LONG TERM GOAL #2   Title  L wrist AROM increase to WNL to turn doorknob, and use in bathing    Baseline  3 days s/p - no AROM - PROM to digits in splint    Time  6    Period  Weeks    Status  New    Target Date  08/25/19      OT LONG TERM GOAL #3   Title  PRWHE  function score improve with more than 20 points    Baseline  eval function at [facility] 50/50    Time  12    Period  Weeks    Status  New    Target Date  10/06/19            Plan - 07/14/19 2057    Clinical Impression Statement  Pt is 3 days s/p L 4th FDP and FDS repair  - during surgery was found for pt to have flexor tendon rupture and needed repair - pt refer for dorsal block splint to wear all the time. Wrist flexion at 20-30 flexion, MC at 70 and PIP extended - pt has z incision in volar 4th digit into palm and over CT - pt incison gabbing open on volar palm at one area - dressing changed and soft tubigrip under splint straps and splint - pt ed on precautions and protocol - PROM DIP , PIP and composite - pain less than 2/10 - edema present - pt can do some ice over splint and bandages -pt to see surgeon tomorrow - pt can benefit from OT services for scar healing , ROM , strengthening following protocol    OT Occupational Profile and History  Problem Focused Assessment - Including review of records relating to presenting problem    Occupational performance deficits (Please refer to evaluation for details):  ADL's;IADL's;Work;Play;Leisure    Body Structure / Function / Physical Skills  ADL;Decreased knowledge of precautions;Flexibility;ROM;UE functional use;Scar mobility;Skin integrity;IADL;Pain;Strength;Edema;FMC    Rehab Potential  Good    Clinical Decision Making  Limited treatment options, no task modification necessary    Comorbidities Affecting Occupational Performance:  May have comorbidities impacting occupational performance   3rd surgery on 4th digit   Modification or Assistance to Complete Evaluation   No modification of tasks or assist necessary to complete eval    OT Frequency  2x / week    OT Duration  12 weeks    OT Treatment/Interventions  Self-care/ADL training;Therapeutic exercise;Patient/family education;Splinting;Paraffin;Fluidtherapy;Contrast  Bath;Manual Therapy;Passive  range of motion;Scar mobilization    Plan  splnt check , incision ,HEP    OT Home Exercise Plan  see pt instruction    Consulted and Agree with Plan of Care  Patient       Patient will benefit from skilled therapeutic intervention in order to improve the following deficits and impairments:   Body Structure / Function / Physical Skills: ADL, Decreased knowledge of precautions, Flexibility, ROM, UE functional use, Scar mobility, Skin integrity, IADL, Pain, Strength, Edema, FMC       Visit Diagnosis: Localized edema - Plan: Ot plan of care cert/re-cert  Pain in left hand - Plan: Ot plan of care cert/re-cert  Muscle weakness (generalized) - Plan: Ot plan of care cert/re-cert  Stiffness of left hand, not elsewhere classified - Plan: Ot plan of care cert/re-cert    Problem List Patient Active Problem List   Diagnosis Date Noted  . CAD (coronary artery disease) 07/05/2018  . Diabetes (HCC) 07/05/2018  . Essential hypertension 07/05/2018  . History of stroke 07/05/2018  . Carotid stenosis 07/05/2018  . Confusion   . Altered mental status 08/08/2017    Oletta Cohn OTR/l,CLT 07/14/2019, 9:10 PM  [facility] PH[facility] SPORTS MEDICINE [address], [place], [address] Phone: [phone]   Fax:  [phone]  Name: Thomas Moore MRN: [id number] Date of Birth: Aug 02, 1955

## 2019-07-14 NOTE — Patient Instructions (Signed)
Keep dorsal block splint on at all times - splint fabricated at wrist flexion 20-30 degrees,MC flexion 70 and PIP extended  5 x day - PROM in splint  pain less than 2/10  PROM DIP flexion and extention to splint  PIP flexion and extentoin PROM in splint And composite flexion and extentoin in splint PROM

## 2019-07-17 ENCOUNTER — Ambulatory Visit: Payer: BC Managed Care – PPO | Admitting: Occupational Therapy

## 2019-07-17 ENCOUNTER — Other Ambulatory Visit: Payer: Self-pay

## 2019-07-17 DIAGNOSIS — R600 Localized edema: Secondary | ICD-10-CM

## 2019-07-17 DIAGNOSIS — M6281 Muscle weakness (generalized): Secondary | ICD-10-CM

## 2019-07-17 DIAGNOSIS — M25642 Stiffness of left hand, not elsewhere classified: Secondary | ICD-10-CM

## 2019-07-17 DIAGNOSIS — M79642 Pain in left hand: Secondary | ICD-10-CM

## 2019-07-18 ENCOUNTER — Encounter: Payer: Self-pay | Admitting: Occupational Therapy

## 2019-07-18 NOTE — Therapy (Signed)
[facility] [address] [place], [address] Phone: [phone]   Fax:  [phone]  Occupational Therapy Treatment  Patient Details  Name: Thomas Moore MRN: [id number] Date of Birth: 07-11-1955 Referring Provider (OT): Cranston Neighbor   Encounter Date: 07/17/2019  OT End of Session - 07/18/19 1130    Visit Number  2    Number of Visits  24    Date for OT Re-Evaluation  10/06/19    OT Start Time  1632    OT Stop Time  1703    OT Time Calculation (min)  31 min    Activity Tolerance  Patient tolerated treatment well    Behavior During Therapy  [facility] for tasks assessed/performed       Past Medical History:  Diagnosis Date  . Coronary artery disease   . Diabetes mellitus without complication (HCC)   . GERD (gastroesophageal reflux disease)   . History of single vessel coronary artery bypass   . Hypertension     Past Surgical History:  Procedure Laterality Date  . CORNEAL TRANSPLANT Right   . CORONARY ARTERY BYPASS GRAFT  1997  . FLEXOR TENDON REPAIR Left 07/11/2019   Procedure: FLEXOR tenolysis  REPAIR LEFT RING FINGER with tednon repair;  Surgeon: Kennedy Bucker, MD;  Location: [facility] ORS;  Service: Orthopedics;  Laterality: Left;  . INCISION AND DRAINAGE ABSCESS Left 05/08/2019   Procedure: INCISION AND DRAINAGE ABSCESS;  Surgeon: Donato Heinz, MD;  Location: [facility];  Service: Orthopedics;  Laterality: Left;    There were no vitals filed for this visit.  Subjective Assessment - 07/18/19 1127    Subjective   Patient reports he is doing well and will be going to the doctor on Feb 19th to get his stitches out.  "There is some drainage on the bandage, is that normal?    Pertinent History  History of bilateral CTS but symptoms decrease - pt had L 4th trigger finger that required release on 04/23/2019 by Dr Rosita Kea -but then pt develop infection with I&D abscess  by Dr Ernest Pine 12/3 - had tendon repair 07/11/2019  after 4th FDP and FDS rupture    Patient Stated Goals  Want to get my hand better so I can do my job    Currently in Pain?  No/denies    Pain Score  0-No pain       Recheck of dorsal block splint to L hand, no redness, irritation, pain or pressure areas noted, no changes to splint this date.  Patient reports the splint has felt comfortable.  He has been wearing at all times.Patient continuing with home exercise program 5 x day - PROM in splint with pain less than 2/10  Therapist performing the following exercises followed by patient demonstration for home program:  PROM DIP flexion and extension to splint  PIP flexion and extension PROM in splint And composite flexion and extension in splint PROM  Patient able to demonstrate understanding of home exercise program.  Patient's bandages removed and replaced with sterile clean bandages.                       OT Education - 07/18/19 1130    Education Details  protocol for flexor tendon repair ,and HEP - splint wearing    Person(s) Educated  Patient    Methods  Explanation;Demonstration;Tactile cues;Verbal cues;Handout    Comprehension  Verbal cues required;Returned demonstration;Verbalized understanding  OT Short Term Goals - 07/14/19 2103      OT SHORT TERM GOAL #1   Title  Pt to be independent with HEP for splint wearing , PROM in splint ,edema control and incision healing    Baseline  no knowledge    Time  3    Period  Weeks    Status  New    Target Date  08/04/19        OT Long Term Goals - 07/14/19 2105      OT LONG TERM GOAL #1   Title  Pt show AROM in L hand to touch palm to use in ADl's    Baseline  Pt only PROM in splint for extention and flexion - 3 days s/p    Time  6    Period  Weeks    Status  New    Target Date  08/25/19      OT LONG TERM GOAL #2   Title  L wrist AROM increase to WNL to turn doorknob, and use in bathing    Baseline  3 days s/p - no AROM - PROM to digits in splint     Time  6    Period  Weeks    Status  New    Target Date  08/25/19      OT LONG TERM GOAL #3   Title  PRWHE function score improve with more than 20 points    Baseline  eval function at [facility] 50/50    Time  12    Period  Weeks    Status  New    Target Date  10/06/19            Plan - 07/18/19 1130    Clinical Impression Statement  Dressing changes this session with new stockinette to left arm, continued education on splint wear and ROM.  Patient able to demonstrate understanding of continued need for wearing splint.  He is able to demonstrate exercises as directed.  Will continue to work towards goals following protocol for surgical repair of left UE.    OT Occupational Profile and History  Problem Focused Assessment - Including review of records relating to presenting problem    Occupational performance deficits (Please refer to evaluation for details):  ADL's;IADL's;Work;Play;Leisure    Body Structure / Function / Physical Skills  ADL;Decreased knowledge of precautions;Flexibility;ROM;UE functional use;Scar mobility;Skin integrity;IADL;Pain;Strength;Edema;FMC    Rehab Potential  Good    Clinical Decision Making  Limited treatment options, no task modification necessary    Comorbidities Affecting Occupational Performance:  May have comorbidities impacting occupational performance    Modification or Assistance to Complete Evaluation   No modification of tasks or assist necessary to complete eval    OT Frequency  2x / week    OT Duration  12 weeks    OT Treatment/Interventions  Self-care/ADL training;Therapeutic exercise;Patient/family education;Splinting;Paraffin;Fluidtherapy;Contrast Bath;Manual Therapy;Passive range of motion;Scar mobilization    Plan  splnt check , incision ,HEP    Consulted and Agree with Plan of Care  Patient       Patient will benefit from skilled therapeutic intervention in order to improve the following deficits and impairments:   Body Structure / Function  / Physical Skills: ADL, Decreased knowledge of precautions, Flexibility, ROM, UE functional use, Scar mobility, Skin integrity, IADL, Pain, Strength, Edema, FMC       Visit Diagnosis: Localized edema  Pain in left hand  Muscle weakness (generalized)  Stiffness of left hand, not elsewhere  classified    Problem List Patient Active Problem List   Diagnosis Date Noted  . CAD (coronary artery disease) 07/05/2018  . Diabetes ([place]) 07/05/2018  . Essential hypertension 07/05/2018  . History of stroke 07/05/2018  . Carotid stenosis 07/05/2018  . Confusion   . Altered mental status 08/08/2017   Mauri Temkin T Masao Junker, OTR/L, CLT  Izyk Marty 07/18/2019, 11:51 AM  [place] [facility]TS MEDICINE [address], [place], [address] Phone: [phone]   Fax:  [phone]  Name: Thomas Moore MRN: [id number] Date of Birth: 07/05/1955

## 2019-07-21 ENCOUNTER — Ambulatory Visit: Payer: BC Managed Care – PPO | Admitting: Occupational Therapy

## 2019-07-21 ENCOUNTER — Other Ambulatory Visit: Payer: Self-pay

## 2019-07-21 DIAGNOSIS — R600 Localized edema: Secondary | ICD-10-CM

## 2019-07-21 DIAGNOSIS — M79642 Pain in left hand: Secondary | ICD-10-CM

## 2019-07-21 DIAGNOSIS — M25642 Stiffness of left hand, not elsewhere classified: Secondary | ICD-10-CM

## 2019-07-21 DIAGNOSIS — M6281 Muscle weakness (generalized): Secondary | ICD-10-CM

## 2019-07-21 NOTE — Patient Instructions (Signed)
Same but can do composite PROM flexion of 3rd thru 5th

## 2019-07-21 NOTE — Therapy (Signed)
[facility] [address], [place], [address] Phone: [phone]   Fax:  [phone]  Occupational Therapy Treatment  Patient Details  Name: Thomas Moore MRN: [id number] Date of Birth: Aug 16, 1955 Referring Provider (OT): Cranston Neighbor   Encounter Date: 07/21/2019  OT End of Session - 07/21/19 1138    Visit Number  3    Number of Visits  24    Date for OT Re-Evaluation  10/06/19    OT Start Time  1105    OT Stop Time  1135    OT Time Calculation (min)  30 min    Activity Tolerance  Patient tolerated treatment well    Behavior During Therapy  [facility] for tasks assessed/performed       Past Medical History:  Diagnosis Date  . Coronary artery disease   . Diabetes mellitus without complication (HCC)   . GERD (gastroesophageal reflux disease)   . History of single vessel coronary artery bypass   . Hypertension     Past Surgical History:  Procedure Laterality Date  . CORNEAL TRANSPLANT Right   . CORONARY ARTERY BYPASS GRAFT  1997  . FLEXOR TENDON REPAIR Left 07/11/2019   Procedure: FLEXOR tenolysis  REPAIR LEFT RING FINGER with tednon repair;  Surgeon: Kennedy Bucker, MD;  Location: [facility];  Service: Orthopedics;  Laterality: Left;  . INCISION AND DRAINAGE ABSCESS Left 05/08/2019   Procedure: INCISION AND DRAINAGE ABSCESS;  Surgeon: Donato Heinz, MD;  Location: [facility];  Service: Orthopedics;  Laterality: Left;    There were no vitals filed for this visit.  Subjective Assessment - 07/21/19 1137    Subjective   Doing okay - do some ice at the end - and not a lot of pain - going to Dr Friday to get some of the stitches out    Pertinent History  History of bilateral CTS but symptoms decrease - pt had L 4th trigger finger that required release on 04/23/2019 by Dr Rosita Kea -but then pt develop infection with I&D abscess  by Dr Ernest Pine 12/3 - had tendon repair 07/11/2019 after 4th FDP and FDS rupture    Patient Stated  Goals  Want to get my hand better so I can do my job    Currently in Pain?  No/denies         Pt arrive with hand base dorsal block splint on L hand  Recheck of dorsal block splint to L hand, no redness, irritation, pain or pressure areas noted, no changes to splint this date.  Patient reports the splint has felt comfortable.  He has been wearing at all times. Patient continuing with home exercise program 5 x day - PROM in splint with pain less than 2/10  Therapist performing the following exercises followed by patient demonstration for home program:  PROM DIP flexion and extension to splint  PIP flexion and extension PROM in splint And composite flexion and extension in splint PROM Then composite flexion PROM and AROM with OT to splint  Patient able to demonstrate understanding of home exercise program.  Patient's bandages removed and replaced with sterile clean bandages                   OT Education - 07/21/19 1138    Education Details  protocol for flexor tendon repair ,and HEP - splint wearing    Person(s) Educated  Patient    Methods  Explanation;Demonstration;Tactile cues;Verbal cues;Handout    Comprehension  Verbal cues required;Returned demonstration;Verbalized understanding       OT Short Term Goals - 07/14/19 2103      OT SHORT TERM GOAL #1   Title  Pt to be independent with HEP for splint wearing , PROM in splint ,edema control and incision healing    Baseline  no knowledge    Time  3    Period  Weeks    Status  New    Target Date  08/04/19        OT Long Term Goals - 07/14/19 2105      OT LONG TERM GOAL #1   Title  Pt show AROM in L hand to touch palm to use in ADl's    Baseline  Pt only PROM in splint for extention and flexion - 3 days s/p    Time  6    Period  Weeks    Status  New    Target Date  08/25/19      OT LONG TERM GOAL #2   Title  L wrist AROM increase to WNL to turn doorknob, and use in bathing    Baseline  3 days s/p -  no AROM - PROM to digits in splint    Time  6    Period  Weeks    Status  New    Target Date  08/25/19      OT LONG TERM GOAL #3   Title  PRWHE function score improve with more than 20 points    Baseline  eval function at [facility] 50/50    Time  12    Period  Weeks    Status  New    Target Date  10/06/19            Plan - 07/21/19 1139    Clinical Impression Statement  Pt is 10 days s/p flexor tendon repair of 4th digit L hand - FDP and FDS - pt edema decreasing , incision healing and increase PROM for flexion and extention in dorsal block splint    OT Occupational Profile and History  Problem Focused Assessment - Including review of records relating to presenting problem    Occupational performance deficits (Please refer to evaluation for details):  ADL's;IADL's;Work;Play;Leisure    Body Structure / Function / Physical Skills  ADL;Decreased knowledge of precautions;Flexibility;ROM;UE functional use;Scar mobility;Skin integrity;IADL;Pain;Strength;Edema;FMC    Rehab Potential  Good    Clinical Decision Making  Limited treatment options, no task modification necessary    Comorbidities Affecting Occupational Performance:  May have comorbidities impacting occupational performance    Modification or Assistance to Complete Evaluation   No modification of tasks or assist necessary to complete eval    OT Frequency  2x / week    OT Duration  --   11 wks   OT Treatment/Interventions  Self-care/ADL training;Therapeutic exercise;Patient/family education;Splinting;Paraffin;Fluidtherapy;Contrast Bath;Manual Therapy;Passive range of motion;Scar mobilization    Plan  splnt check , incision ,HEP    OT Home Exercise Plan  see pt instruction    Consulted and Agree with Plan of Care  Patient       Patient will benefit from skilled therapeutic intervention in order to improve the following deficits and impairments:   Body Structure / Function / Physical Skills: ADL, Decreased knowledge of  precautions, Flexibility, ROM, UE functional use, Scar mobility, Skin integrity, IADL, Pain, Strength, Edema, FMC       Visit Diagnosis: Localized edema  Pain in left hand  Muscle weakness (generalized)  Stiffness of left hand, not elsewhere classified    Problem List Patient Active Problem List   Diagnosis Date Noted  . CAD (coronary artery disease) 07/05/2018  . Diabetes ([place]) 07/05/2018  . Essential hypertension 07/05/2018  . History of stroke 07/05/2018  . Carotid stenosis 07/05/2018  . Confusion   . Altered mental status 08/08/2017    Rosalyn Gess OTR/L,CLT 07/21/2019, 11:40 AM  [place] [facility] SPORTS MEDIC[facility] [address], [place], [address] Phone: [phone]   Fax:  [phone]  Name: Thomas Moore MRN: [id number] Date of Birth: 1955/10/22

## 2019-07-23 ENCOUNTER — Encounter: Payer: Self-pay | Admitting: *Deleted

## 2019-07-28 ENCOUNTER — Other Ambulatory Visit: Payer: Self-pay

## 2019-07-28 ENCOUNTER — Ambulatory Visit: Payer: BC Managed Care – PPO | Admitting: Occupational Therapy

## 2019-07-28 DIAGNOSIS — M79642 Pain in left hand: Secondary | ICD-10-CM

## 2019-07-28 DIAGNOSIS — M25642 Stiffness of left hand, not elsewhere classified: Secondary | ICD-10-CM

## 2019-07-28 DIAGNOSIS — M6281 Muscle weakness (generalized): Secondary | ICD-10-CM

## 2019-07-28 DIAGNOSIS — R600 Localized edema: Secondary | ICD-10-CM | POA: Diagnosis not present

## 2019-07-28 NOTE — Therapy (Signed)
[facility] [facility] [address] [place], [address] Phone: [phone]   Fax:  [phone]  Occupational Therapy Treatment  Patient Details  Name: Thomas Moore MRN: [id number] Date of Birth: 1955-07-22 Referring Provider (OT): Thomas Moore   Encounter Date: 07/28/2019  OT End of Session - 07/28/19 1138    Visit Number  4    Number of Visits  24    Date for OT Re-Evaluation  10/06/19    OT Start Time  1116    OT Stop Time  1208    OT Time Calculation (min)  52 min    Activity Tolerance  Patient tolerated treatment well    Behavior During Therapy  [facility] for tasks assessed/performed       Past Medical History:  Diagnosis Date  . Coronary artery disease   . Diabetes mellitus without complication (HCC)   . GERD (gastroesophageal reflux disease)   . History of single vessel coronary artery bypass   . Hypertension     Past Surgical History:  Procedure Laterality Date  . CORNEAL TRANSPLANT Right   . CORONARY ARTERY BYPASS GRAFT  1997  . FLEXOR TENDON REPAIR Left 07/11/2019   Procedure: FLEXOR tenolysis  REPAIR LEFT RING FINGER with tednon repair;  Surgeon: Kennedy Bucker, MD;  Location: [facility];  Service: Orthopedics;  Laterality: Left;  . INCISION AND DRAINAGE ABSCESS Left 05/08/2019   Procedure: INCISION AND DRAINAGE ABSCESS;  Surgeon: Donato Heinz, MD;  Location: [facility];  Service: Orthopedics;  Laterality: Left;    There were no vitals filed for this visit.  Subjective Assessment - 07/28/19 1134    Subjective   I have seen Thomas Moore and had my stitches got out at the wrist- others will desolve - pain only when bending my finger    Pertinent History  History of bilateral CTS but symptoms decrease - pt had L 4th trigger finger that required release on 04/23/2019 by Dr Rosita Kea -but then pt develop infection with I&D abscess  by Dr Ernest Pine 12/3 - had tendon repair 07/11/2019 after 4th FDP and FDS rupture    Patient  Stated Goals  Want to get my hand better so I can do my job    Currently in Pain?  No/denies                   OT Treatments/Exercises (OP) - 07/28/19 0001      LUE Contrast Bath   Time  9 minutes    Comments  prior to ROM and soft tissue         Pt arrive with hand base dorsal block splint on L hand  Recheck of dorsal block splint to L hand, no redness, irritation, pain or pressure areas noted, no changes to splint this date. Patient reports the splint has felt comfortable. He has been wearing at all times. Patient continuing with home exercise program 5 x day - PROM in splint with pain less than 2/10  Therapist performing the following exercises followed by patient demonstration for home program: Did check sterri strips at volar wrist one area still not sure of healing - will take off lateral this week if not off yet   PROM DIP flexion and extension to splint  PIP flexion and extension PROM in splint- with focus on extention stretch  With MC block at 90 flexion And composite flexion and extension in splint PROM- had to do several sets of this one  to touch palm  Done by OT  Patient able to demonstrate understanding of home exercise program.  Tubi grip F provided to provide some compression on hand - did not do coban - afraid pt not be able to apply and maintain precautions - ? Carry over to HEP         OT Education - 07/28/19 1217    Education Details  protocol for flexor tendon repair ,and HEP - splint wearing    Person(s) Educated  Patient    Methods  Explanation;Demonstration;Tactile cues;Verbal cues;Handout    Comprehension  Verbal cues required;Returned demonstration;Verbalized understanding       OT Short Term Goals - 07/14/19 2103      OT SHORT TERM GOAL #1   Title  Pt to be independent with HEP for splint wearing , PROM in splint ,edema control and incision healing    Baseline  no knowledge    Time  3    Period  Weeks    Status  New    Target  Date  08/04/19        OT Long Term Goals - 07/14/19 2105      OT LONG TERM GOAL #1   Title  Pt show AROM in L hand to touch palm to use in ADl's    Baseline  Pt only PROM in splint for extention and flexion - 3 days s/p    Time  6    Period  Weeks    Status  New    Target Date  08/25/19      OT LONG TERM GOAL #2   Title  L wrist AROM increase to WNL to turn doorknob, and use in bathing    Baseline  3 days s/p - no AROM - PROM to digits in splint    Time  6    Period  Weeks    Status  New    Target Date  08/25/19      OT LONG TERM GOAL #3   Title  PRWHE function score improve with more than 20 points    Baseline  eval function at [facility] 50/50    Time  12    Period  Weeks    Status  New    Target Date  10/06/19            Plan - 07/28/19 1138    Clinical Impression Statement  Pt is 17 days s/p Flexor tendon repair on 4th digit L hand - FDP an FDS - pt cont to have some edema and stiffness more in 4th PIP , proximal phalanges and in hand - but able to work it out in session this date and extention of PIP PROM in splint by OT ths date - still kept sterri strips on at wrist this date will take off late this week if still on    OT Occupational Profile and History  Problem Focused Assessment - Including review of records relating to presenting problem    Occupational performance deficits (Please refer to evaluation for details):  ADL's;IADL's;Work;Play;Leisure    Body Structure / Function / Physical Skills  ADL;Decreased knowledge of precautions;Flexibility;ROM;UE functional use;Scar mobility;Skin integrity;IADL;Pain;Strength;Edema;FMC    Rehab Potential  Good    Clinical Decision Making  Limited treatment options, no task modification necessary    Comorbidities Affecting Occupational Performance:  May have comorbidities impacting occupational performance    Modification or Assistance to Complete Evaluation   No modification of tasks or assist necessary to  complete eval    OT  Frequency  2x / week    OT Duration  --   10 wks   OT Treatment/Interventions  Self-care/ADL training;Therapeutic exercise;Patient/family education;Splinting;Paraffin;Fluidtherapy;Contrast Bath;Manual Therapy;Passive range of motion;Scar mobilization    Plan  splnt check , incision ,HEP    OT Home Exercise Plan  see pt instruction    Consulted and Agree with Plan of Care  Patient       Patient will benefit from skilled therapeutic intervention in order to improve the following deficits and impairments:   Body Structure / Function / Physical Skills: ADL, Decreased knowledge of precautions, Flexibility, ROM, UE functional use, Scar mobility, Skin integrity, IADL, Pain, Strength, Edema, FMC       Visit Diagnosis: Localized edema  Pain in left hand  Muscle weakness (generalized)  Stiffness of left hand, not elsewhere classified    Problem List Patient Active Problem List   Diagnosis Date Noted  . CAD (coronary artery disease) 07/05/2018  . Diabetes (HCC) 07/05/2018  . Essential hypertension 07/05/2018  . History of stroke 07/05/2018  . Carotid stenosis 07/05/2018  . Confusion   . Altered mental status 08/08/2017    Oletta Cohn OTR/l,CLT 07/28/2019, 12:20 PM  [facility] PHYS[facility] AND SPORTS MEDICINE [address], [place], [address] Phone: [phone]   Fax:  [phone]  Name: Thomas Moore MRN: [id number] Date of Birth: [birth year]

## 2019-07-28 NOTE — Patient Instructions (Signed)
Same PROM for flexion of 3rd thru 5th in splint - DIP, PIP and composite

## 2019-08-01 ENCOUNTER — Other Ambulatory Visit: Payer: Self-pay

## 2019-08-01 ENCOUNTER — Ambulatory Visit: Payer: BC Managed Care – PPO | Admitting: Occupational Therapy

## 2019-08-01 ENCOUNTER — Encounter: Payer: Self-pay | Admitting: Occupational Therapy

## 2019-08-01 DIAGNOSIS — R600 Localized edema: Secondary | ICD-10-CM

## 2019-08-01 DIAGNOSIS — M6281 Muscle weakness (generalized): Secondary | ICD-10-CM

## 2019-08-01 DIAGNOSIS — M79642 Pain in left hand: Secondary | ICD-10-CM

## 2019-08-01 DIAGNOSIS — M25642 Stiffness of left hand, not elsewhere classified: Secondary | ICD-10-CM

## 2019-08-01 NOTE — Therapy (Signed)
[place] [facility] [address], [place], [address] Phone: [phone]   Fax:  [phone]  Occupational Therapy Treatment  Patient Details  Name: Thomas Moore MRN: [id number] Date of Birth: 11-22-1955 Referring Provider (OT): Rachelle Hora   Encounter Date: 08/01/2019  OT End of Session - 08/01/19 1605    Visit Number  5    Number of Visits  24    Date for OT Re-Evaluation  10/06/19    OT Start Time  1050    OT Stop Time  1135    OT Time Calculation (min)  45 min    Activity Tolerance  Patient tolerated treatment well    Behavior During Therapy  [facility] for tasks assessed/performed       Past Medical History:  Diagnosis Date  . Coronary artery disease   . Diabetes mellitus without complication ([place])   . GERD (gastroesophageal reflux disease)   . History of single vessel coronary artery bypass   . Hypertension     Past Surgical History:  Procedure Laterality Date  . CORNEAL TRANSPLANT Right   . CORONARY ARTERY BYPASS GRAFT  1997  . FLEXOR TENDON REPAIR Left 07/11/2019   Procedure: FLEXOR tenolysis  REPAIR LEFT RING FINGER with tednon repair;  Surgeon: Hessie Knows, MD;  Location: [facility];  Service: Orthopedics;  Laterality: Left;  . INCISION AND DRAINAGE ABSCESS Left 05/08/2019   Procedure: INCISION AND DRAINAGE ABSCESS;  Surgeon: Dereck Leep, MD;  Location: [facility];  Service: Orthopedics;  Laterality: Left;    There were no vitals filed for this visit.  Subjective Assessment - 08/01/19 1604    Subjective   Patient reports he is doing well, no pain.  "They told me the stitches near the top will just dissolve."    Pertinent History  History of bilateral CTS but symptoms decrease - pt had L 4th trigger finger that required release on 04/23/2019 by Dr Rudene Christians -but then pt develop infection with I&D abscess  by Dr Marry Guan 12/3 - had tendon repair 07/11/2019 after 4th FDP and FDS rupture    Patient Stated Goals   Want to get my hand better so I can do my job    Currently in Pain?  No/denies    Pain Score  0-No pain        Pt arrived with hand based dorsal block splint to L hand  Recheck of dorsal block splint to L hand, no redness, irritation, pain or pressure areas noted, no changes to splint this date. Patient reports the splint has felt comfortable. He has been wearing at all times.  Manual therapy:  Patient seen for steri strip removal at the wrist, wound care to clean area, debridement of area performed of excess dead skin followed by scar massage to area with instruction to patient on how to perform as a part of his home program.  Issued and instructed on CICA care silicone to wear at night over scar at wrist to assist with scar management and healing.    Patient continuing with home exercise program 5 x day - PROM in splint with pain less than 2/10  Therapist performing the following exercises followed by patient demonstration for home program:  PROM DIP flexion and extension to splint  PIP flexion and extension PROM in splint- with focus on extension stretch  With MCPs blocked at 90 flexion composite flexion and extension in splint PROMmultiple sets performed to achieve fingers to palm  performed by OT Patient able to demonstrate understanding of home exercise program.  Replaced Tubi grip F provided to provide some compression on hand  Response to tx: Patient continues to progress in all areas, no pain reported during session.  Patient's scar at volar wrist healing well, steri strips and dead skin removed.  Other scar still has dissolving stitches and continues to improve.  Continued focus on PROM and manual techniques around scar areas to promote healing and ROM.  Patient able to demonstrate understanding of HEP, added scar massage and CICA care this date.  Continue OT towards goals towards protocol for tendon repair.              OT Treatments/Exercises (OP) - 08/01/19 1621       LUE Contrast Bath   Time  9 minutes    Comments  or to ROM and soft tissue              OT Education - 08/01/19 1605    Education Details  protocol for flexor tendon repair ,and HEP - splint wearing, use of cica care for scar management    Person(s) Educated  Patient    Methods  Explanation;Demonstration;Tactile cues;Verbal cues;Handout    Comprehension  Verbal cues required;Returned demonstration;Verbalized understanding       OT Short Term Goals - 07/14/19 2103      OT SHORT TERM GOAL #1   Title  Pt to be independent with HEP for splint wearing , PROM in splint ,edema control and incision healing    Baseline  no knowledge    Time  3    Period  Weeks    Status  New    Target Date  08/04/19        OT Long Term Goals - 07/14/19 2105      OT LONG TERM GOAL #1   Title  Pt show AROM in L hand to touch palm to use in ADl's    Baseline  Pt only PROM in splint for extention and flexion - 3 days s/p    Time  6    Period  Weeks    Status  New    Target Date  08/25/19      OT LONG TERM GOAL #2   Title  L wrist AROM increase to WNL to turn doorknob, and use in bathing    Baseline  3 days s/p - no AROM - PROM to digits in splint    Time  6    Period  Weeks    Status  New    Target Date  08/25/19      OT LONG TERM GOAL #3   Title  PRWHE function score improve with more than 20 points    Baseline  eval function at [facility] 50/50    Time  12    Period  Weeks    Status  New    Target Date  10/06/19            Plan - 08/01/19 1606    Clinical Impression Statement  Patient continues to progress in all areas, no pain reported during session.  Patient's scar at volar wrist healing well, steri strips and dead skin removed.  Other scar still has dissolving stitches and continues to improve.  Continued focus on PROM and manual techniques around scar areas to promote healing and ROM.  Patient able to demonstrate understanding of HEP, added scar massage and CICA care this  date.  Continue  OT towards goals towards protocol for tendon repair.    OT Occupational Profile and History  Problem Focused Assessment - Including review of records relating to presenting problem    Occupational performance deficits (Please refer to evaluation for details):  ADL's;IADL's;Work;Play;Leisure    Body Structure / Function / Physical Skills  ADL;Decreased knowledge of precautions;Flexibility;ROM;UE functional use;Scar mobility;Skin integrity;IADL;Pain;Strength;Edema;FMC    Rehab Potential  Good    Clinical Decision Making  Limited treatment options, no task modification necessary    Comorbidities Affecting Occupational Performance:  May have comorbidities impacting occupational performance    Modification or Assistance to Complete Evaluation   No modification of tasks or assist necessary to complete eval    OT Frequency  2x / week    OT Duration  --   10 weeks   OT Treatment/Interventions  Self-care/ADL training;Therapeutic exercise;Patient/family education;Splinting;Paraffin;Fluidtherapy;Contrast Bath;Manual Therapy;Passive range of motion;Scar mobilization    Consulted and Agree with Plan of Care  Patient       Patient will benefit from skilled therapeutic intervention in order to improve the following deficits and impairments:   Body Structure / Function / Physical Skills: ADL, Decreased knowledge of precautions, Flexibility, ROM, UE functional use, Scar mobility, Skin integrity, IADL, Pain, Strength, Edema, FMC       Visit Diagnosis: Localized edema  Pain in left hand  Muscle weakness (generalized)  Stiffness of left hand, not elsewhere classified    Problem List Patient Active Problem List   Diagnosis Date Noted  . CAD (coronary artery disease) 07/05/2018  . Diabetes (HCC) 07/05/2018  . Essential hypertension 07/05/2018  . History of stroke 07/05/2018  . Carotid stenosis 07/05/2018  . Confusion   . Altered mental status 08/08/2017   Jahnessa Vanduyn T Arne Cleveland, OTR/L,  CLT  Merrik Puebla 08/01/2019, 4:22 PM  [facility] [address] [place], [address] Phone: [phone]   Fax:  [phone]  Name: KYLAN LIBERATI MRN: [id number] Date of Birth: 1955-11-26

## 2019-08-04 ENCOUNTER — Ambulatory Visit: Payer: BC Managed Care – PPO | Attending: Orthopedic Surgery | Admitting: Occupational Therapy

## 2019-08-04 ENCOUNTER — Other Ambulatory Visit: Payer: Self-pay

## 2019-08-04 DIAGNOSIS — R600 Localized edema: Secondary | ICD-10-CM | POA: Insufficient documentation

## 2019-08-04 DIAGNOSIS — M6281 Muscle weakness (generalized): Secondary | ICD-10-CM | POA: Diagnosis present

## 2019-08-04 DIAGNOSIS — M79642 Pain in left hand: Secondary | ICD-10-CM | POA: Diagnosis present

## 2019-08-04 DIAGNOSIS — M25642 Stiffness of left hand, not elsewhere classified: Secondary | ICD-10-CM | POA: Diagnosis present

## 2019-08-04 NOTE — Therapy (Signed)
[facility] [address], [place], [address] Phone: [phone]   Fax:  [phone]  Occupational Therapy Treatment  Patient Details  Name: Thomas Moore MRN: [id number] Date of Birth: 20-Feb-1956 Referring Provider (OT): Cranston Neighbor   Encounter Date: 08/04/2019  OT End of Session - 08/04/19 1128    Visit Number  6    Number of Visits  24    Date for OT Re-Evaluation  10/06/19    OT Start Time  1045    OT Stop Time  1125    OT Time Calculation (min)  40 min    Activity Tolerance  Patient tolerated treatment well    Behavior During Therapy  [facility] for tasks assessed/performed       Past Medical History:  Diagnosis Date  . Coronary artery disease   . Diabetes mellitus without complication (H[facility])   . GERD (gastroesophageal reflux disease)   . History of single vessel coronary artery bypass   . Hypertension     Past Surgical History:  Procedure Laterality Date  . CORNEAL TRANSPLANT Right   . CORONARY ARTERY BYPASS GRAFT  1997  . FLEXOR TENDON REPAIR Left 07/11/2019   Procedure: FLEXOR tenolysis  REPAIR LEFT RING FINGER with tednon repair;  Surgeon: Kennedy Bucker, MD;  Location: [facility];  Service: Orthopedics;  Laterality: Left;  . INCISION AND DRAINAGE ABSCESS Left 05/08/2019   Procedure: INCISION AND DRAINAGE ABSCESS;  Surgeon: Donato Heinz, MD;  Location: [facility];  Service: Orthopedics;  Laterality: Left;    There were no vitals filed for this visit.  Subjective Assessment - 08/04/19 1125    Subjective   I cover my splint when I take shower - cannot really do anything with this splint on - no pain - I do not get my finger down to the palm like you do    Pertinent History  History of bilateral CTS but symptoms decrease - pt had L 4th trigger finger that required release on 04/23/2019 by Dr Rosita Kea -but then pt develop infection with I&D abscess  by Dr Ernest Pine 12/3 - had tendon repair 07/11/2019 after 4th FDP  and FDS rupture    Patient Stated Goals  Want to get my hand better so I can do my job    Currently in Pain?  No/denies         [facility] OT Assessment - 08/04/19 0001      Left Hand PROM   L Ring  MCP 0-90  90 Degrees    L Ring PIP 0-100  90 Degrees   -25              OT Treatments/Exercises (OP) - 08/04/19 0001      LUE Contrast Bath   Time  9 minutes    Comments  prior to ROM and scar massage        Pt arrive with hand base dorsal block splint on L hand Recheck of dorsal block splint to L hand, no redness, irritation, pain or pressure areas noted, no changes to splint this date. Patient reports the splint has felt comfortable. He has been wearing at all times. Patient continuing with home exercise program 5 x day - PROM in splint with pain less than 2/10  Therapist performing the following exercises followed by patient demonstration for home program:   PROM DIP flexion and extension to splint  PIP flexion and extension PROM in splint- with focus on extention  stretch for PIP extention   With MC block at 90 flexion And composite flexion and extension in splint PROM- had to do several sets of this one to touch palm  Done by OT  Patient able to demonstrate understanding of home exercise program.  Tubi grip E provided to provide some compression on hand - did not do coban - afraid pt not be able to apply and maintain precautions - ? Carry over to HEP   Scar massage done on volar wrist and soft tissue around incision and scabs and stitches on volar palm and 4th digit- some of scab come off on distal palm - with good healing        OT Education - 08/04/19 1128    Education Details  protocol for flexor tendon repair ,and HEP - splint wearing, use of cica care for scar management    Person(s) Educated  Patient    Methods  Explanation;Demonstration;Tactile cues;Verbal cues;Handout    Comprehension  Verbal cues required;Returned demonstration;Verbalized understanding        OT Short Term Goals - 07/14/19 2103      OT SHORT TERM GOAL #1   Title  Pt to be independent with HEP for splint wearing , PROM in splint ,edema control and incision healing    Baseline  no knowledge    Time  3    Period  Weeks    Status  New    Target Date  08/04/19        OT Long Term Goals - 07/14/19 2105      OT LONG TERM GOAL #1   Title  Pt show AROM in L hand to touch palm to use in ADl's    Baseline  Pt only PROM in splint for extention and flexion - 3 days s/p    Time  6    Period  Weeks    Status  New    Target Date  08/25/19      OT LONG TERM GOAL #2   Title  L wrist AROM increase to WNL to turn doorknob, and use in bathing    Baseline  3 days s/p - no AROM - PROM to digits in splint    Time  6    Period  Weeks    Status  New    Target Date  08/25/19      OT LONG TERM GOAL #3   Title  PRWHE function score improve with more than 20 points    Baseline  eval function at [facility] 50/50    Time  12    Period  Weeks    Status  New    Target Date  10/06/19            Plan - 08/04/19 1216    Clinical Impression Statement  Pt is 3 1/2 wks s/p Flexor tendon repair on L 4th FDP and FDS - pt was able to start scar massage on volar wrist - scabs starting to come off on distal part in hand - - pt to cont PROM in splint for 3rd thru 5th digit- PROM 40 MC and PIP flexion- PIP -25 this date in splint - pt to focus on massage ,swelling and PROM in splint - composite touching palm    OT Occupational Profile and History  Problem Focused Assessment - Including review of records relating to presenting problem    Occupational performance deficits (Please refer to evaluation for details):  ADL's;IADL's;Work;Play;Leisure    Body  Structure / Function / Physical Skills  ADL;Decreased knowledge of precautions;Flexibility;ROM;UE functional use;Scar mobility;Skin integrity;IADL;Pain;Strength;Edema;FMC    Rehab Potential  Good    Clinical Decision Making  Limited treatment  options, no task modification necessary    Comorbidities Affecting Occupational Performance:  May have comorbidities impacting occupational performance    Modification or Assistance to Complete Evaluation   No modification of tasks or assist necessary to complete eval    OT Frequency  2x / week    OT Duration  8 weeks    OT Treatment/Interventions  Self-care/ADL training;Therapeutic exercise;Patient/family education;Splinting;Paraffin;Fluidtherapy;Contrast Bath;Manual Therapy;Passive range of motion;Scar mobilization    Plan  splnt check , incision ,HEP    OT Home Exercise Plan  see pt instruction    Consulted and Agree with Plan of Care  Patient       Patient will benefit from skilled therapeutic intervention in order to improve the following deficits and impairments:   Body Structure / Function / Physical Skills: ADL, Decreased knowledge of precautions, Flexibility, ROM, UE functional use, Scar mobility, Skin integrity, IADL, Pain, Strength, Edema, FMC       Visit Diagnosis: Localized edema  Pain in left hand  Muscle weakness (generalized)  Stiffness of left hand, not elsewhere classified    Problem List Patient Active Problem List   Diagnosis Date Noted  . CAD (coronary artery disease) 07/05/2018  . Diabetes (HCC) 07/05/2018  . Essential hypertension 07/05/2018  . History of stroke 07/05/2018  . Carotid stenosis 07/05/2018  . Confusion   . Altered mental status 08/08/2017    Oletta Cohn OTR/l,CLT 08/04/2019, 12:19 PM  [place] [facility]TS [facility]IC[facility] [address], [place], [address] Phone: [phone]   Fax:  [phone]  Name: Thomas Moore MRN: [id number] Date of Birth: 10/25/1955

## 2019-08-04 NOTE — Patient Instructions (Signed)
same

## 2019-08-06 ENCOUNTER — Encounter: Payer: BC Managed Care – PPO | Admitting: Occupational Therapy

## 2019-08-07 ENCOUNTER — Ambulatory Visit: Payer: BC Managed Care – PPO | Admitting: Occupational Therapy

## 2019-08-07 ENCOUNTER — Other Ambulatory Visit: Payer: Self-pay

## 2019-08-07 DIAGNOSIS — M25642 Stiffness of left hand, not elsewhere classified: Secondary | ICD-10-CM

## 2019-08-07 DIAGNOSIS — M6281 Muscle weakness (generalized): Secondary | ICD-10-CM

## 2019-08-07 DIAGNOSIS — R600 Localized edema: Secondary | ICD-10-CM | POA: Diagnosis not present

## 2019-08-07 DIAGNOSIS — M79642 Pain in left hand: Secondary | ICD-10-CM

## 2019-08-07 NOTE — Therapy (Signed)
[facility] [address], [place], [address] Phone: [phone]   Fax:  [phone]  Occupational Therapy Treatment  Patient Details  Name: Thomas Moore MRN: [id number] Date of Birth: 03-25-1956 Referring Provider (OT): Cranston Neighbor   Encounter Date: 08/07/2019  OT End of Session - 08/07/19 1128    Visit Number  7    Number of Visits  24    Date for OT Re-Evaluation  10/06/19    OT Start Time  1108    OT Stop Time  1159    OT Time Calculation (min)  51 min    Activity Tolerance  Patient tolerated treatment well    Behavior During Therapy  [facility] for tasks assessed/performed       Past Medical History:  Diagnosis Date  . Coronary artery disease   . Diabetes mellitus without complication (HCC)   . GERD (gastroesophageal reflux disease)   . History of single vessel coronary artery bypass   . Hypertension     Past Surgical History:  Procedure Laterality Date  . CORNEAL TRANSPLANT Right   . CORONARY ARTERY BYPASS GRAFT  1997  . FLEXOR TENDON REPAIR Left 07/11/2019   Procedure: FLEXOR tenolysis  REPAIR LEFT RING FINGER with tednon repair;  Surgeon: Kennedy Bucker, MD;  Location: [facility];  Service: Orthopedics;  Laterality: Left;  . INCISION AND DRAINAGE ABSCESS Left 05/08/2019   Procedure: INCISION AND DRAINAGE ABSCESS;  Surgeon: Donato Heinz, MD;  Location: [facility];  Service: Orthopedics;  Laterality: Left;    There were no vitals filed for this visit.  Subjective Assessment - 08/07/19 1127    Subjective   Doing okay - no pain - did not do any exercises this am    Pertinent History  History of bilateral CTS but symptoms decrease - pt had L 4th trigger finger that required release on 04/23/2019 by Dr Rosita Kea -but then pt develop infection with I&D abscess  by Dr Ernest Pine 12/3 - had tendon repair 07/11/2019 after 4th FDP and FDS rupture    Patient Stated Goals  Want to get my hand better so I can do my job     Currently in Pain?  Yes    Pain Score  2     Pain Location  Hand    Pain Orientation  Left    Pain Descriptors / Indicators  Aching;Tender    Pain Type  Surgical pain    Pain Onset  More than a month ago    Pain Frequency  Intermittent    Aggravating Factors   with PROM digits i nsplint         [facility] OT Assessment - 08/07/19 0001      Left Hand AROM   L Long  MCP 0-90  80 Degrees    L Long PIP 0-100  75 Degrees   in splint   L Ring  MCP 0-90  80 Degrees    L Ring PIP 0-100  60 Degrees   in splint    L Little  MCP 0-90  90 Degrees    L Little PIP 0-100  60 Degrees   in splint      Pt arrive with dorsal block splint in place - pt report did not do any exercises at home today yet Pt every time very stiff coming in Did remove some more scabs on volar hand and digits incision -large sutures visible still will contact surgeon if need  to come out next week Healing good -done some scar massage this date -and vibration over proximal scar at wrist and soft issue next to distal scar         OT Treatments/Exercises (OP) - 08/07/19 0001      LUE Contrast Bath   Time  9 minutes    Comments  prior to ROm and soft tissue        Patient continuing with home exercise program 5 x day - PROM in splint with pain less than 2/10  Therapist performing the following exercises followed by patient demonstration for home program:   PROM DIP flexion and extension to splint  PIP flexion and extension PROM in splint- with focus on extention stretch for PIP extention  With MC block at 90 flexion And composite flexion and extension in splint PROM- had to do several sets of this one to touch palm  -stiffness  Add and review with pt this date place and hold AROM composite fist and active extention in splint  And then AAROM for flexion in splint and AROM extention in splint  Done by OT Patient able to demonstrate understanding of home exercise program Splint still on at all times         OT Education - 08/07/19 1128    Education Details  protocol for flexor tendon repair ,and HEP - splint wearing, use of cica care for scar management, AROM add in splint    Person(s) Educated  Patient    Methods  Explanation;Demonstration;Tactile cues;Verbal cues;Handout    Comprehension  Verbal cues required;Returned demonstration;Verbalized understanding       OT Short Term Goals - 07/14/19 2103      OT SHORT TERM GOAL #1   Title  Pt to be independent with HEP for splint wearing , PROM in splint ,edema control and incision healing    Baseline  no knowledge    Time  3    Period  Weeks    Status  New    Target Date  08/04/19        OT Long Term Goals - 07/14/19 2105      OT LONG TERM GOAL #1   Title  Pt show AROM in L hand to touch palm to use in ADl's    Baseline  Pt only PROM in splint for extention and flexion - 3 days s/p    Time  6    Period  Weeks    Status  New    Target Date  08/25/19      OT LONG TERM GOAL #2   Title  L wrist AROM increase to WNL to turn doorknob, and use in bathing    Baseline  3 days s/p - no AROM - PROM to digits in splint    Time  6    Period  Weeks    Status  New    Target Date  08/25/19      OT LONG TERM GOAL #3   Title  PRWHE function score improve with more than 20 points    Baseline  eval function at [facility] 50/50    Time  12    Period  Weeks    Status  New    Target Date  10/06/19            Plan - 08/07/19 1128    Clinical Impression Statement  Pt is 4 wks s/p Flexor tendon repair on L 4th FDP and FDS - pt scabs  improving and incision -able to initiate more scar massage - per protocol initiate this date place and hold  flexion of digits AROM in splint , and Active extention to splint - add to HEP and pt demo understanding - can initiate AROM for wrist and digits next session - pt still has sutures in place -will contact surgeon office nxt week to removed    OT Occupational Profile and History  Problem Focused  Assessment - Including review of records relating to presenting problem    Occupational performance deficits (Please refer to evaluation for details):  ADL's;IADL's;Work;Play;Leisure    Body Structure / Function / Physical Skills  ADL;Decreased knowledge of precautions;Flexibility;ROM;UE functional use;Scar mobility;Skin integrity;IADL;Pain;Strength;Edema;FMC    Rehab Potential  Good    Clinical Decision Making  Limited treatment options, no task modification necessary    Comorbidities Affecting Occupational Performance:  May have comorbidities impacting occupational performance    Modification or Assistance to Complete Evaluation   No modification of tasks or assist necessary to complete eval    OT Frequency  2x / week    OT Duration  6 weeks    OT Treatment/Interventions  Self-care/ADL training;Therapeutic exercise;Patient/family education;Splinting;Paraffin;Fluidtherapy;Contrast Bath;Manual Therapy;Passive range of motion;Scar mobilization    Plan  splnt check , incision ,HEP, AROM initiate    OT Home Exercise Plan  see pt instruction    Consulted and Agree with Plan of Care  Patient       Patient will benefit from skilled therapeutic intervention in order to improve the following deficits and impairments:   Body Structure / Function / Physical Skills: ADL, Decreased knowledge of precautions, Flexibility, ROM, UE functional use, Scar mobility, Skin integrity, IADL, Pain, Strength, Edema, FMC       Visit Diagnosis: Localized edema  Pain in left hand  Muscle weakness (generalized)  Stiffness of left hand, not elsewhere classified    Problem List Patient Active Problem List   Diagnosis Date Noted  . CAD (coronary artery disease) 07/05/2018  . Diabetes ([place]) 07/05/2018  . Essential hypertension 07/05/2018  . History of stroke 07/05/2018  . Carotid stenosis 07/05/2018  . Confusion   . Altered mental status 08/08/2017    Rosalyn Gess OTR/L,CLT 08/07/2019, 12:16 PM  [place] [facility] [address] [place], [address] Phone: [phone]   Fax:  [phone]  Name: Thomas Moore MRN: [id number] Date of Birth: Sep 27, 1955

## 2019-08-07 NOTE — Patient Instructions (Signed)
Same PROM in splint and contrast prior  Can do scar massage  Add place and hold AROM composite fist in splint -and AROM extention of digits in block splint  Splint on still at all times

## 2019-08-13 ENCOUNTER — Ambulatory Visit: Payer: BC Managed Care – PPO | Admitting: Occupational Therapy

## 2019-08-13 ENCOUNTER — Other Ambulatory Visit: Payer: Self-pay

## 2019-08-13 DIAGNOSIS — M25642 Stiffness of left hand, not elsewhere classified: Secondary | ICD-10-CM

## 2019-08-13 DIAGNOSIS — R600 Localized edema: Secondary | ICD-10-CM | POA: Diagnosis not present

## 2019-08-13 DIAGNOSIS — M79642 Pain in left hand: Secondary | ICD-10-CM

## 2019-08-13 DIAGNOSIS — M6281 Muscle weakness (generalized): Secondary | ICD-10-CM

## 2019-08-14 ENCOUNTER — Encounter: Payer: Self-pay | Admitting: Occupational Therapy

## 2019-08-14 NOTE — Therapy (Signed)
[facility] [address], [place], [address] Phone: [phone]   Fax:  [phone]  Occupational Therapy Treatment  Patient Details  Name: NIKLAUS MAMARIL MRN: [id number] Date of Birth: [birth year] Referring Provider (OT): Cranston Neighbor   Encounter Date: 08/13/2019  OT End of Session - 08/14/19 1928    Visit Number  8    Number of Visits  24    Date for OT Re-Evaluation  10/06/19    OT Start Time  0801    OT Stop Time  0910    OT Time Calculation (min)  69 min    Activity Tolerance  Patient tolerated treatment well    Behavior During Therapy  [facility] for tasks assessed/performed       Past Medical History:  Diagnosis Date  . Coronary artery disease   . Diabetes mellitus without complication (HCC)   . GERD (gastroesophageal reflux disease)   . History of single vessel coronary artery bypass   . Hypertension     Past Surgical History:  Procedure Laterality Date  . CORNEAL TRANSPLANT Right   . CORONARY ARTERY BYPASS GRAFT  1997  . FLEXOR TENDON REPAIR Left 07/11/2019   Procedure: FLEXOR tenolysis  REPAIR LEFT RING FINGER with tednon repair;  Surgeon: Kennedy Bucker, MD;  Location: [facility] ORS;  Service: Orthopedics;  Laterality: Left;  . INCISION AND DRAINAGE ABSCESS Left 05/08/2019   Procedure: INCISION AND DRAINAGE ABSCESS;  Surgeon: Donato Heinz, MD;  Location: [facility] ORS;  Service: Orthopedics;  Laterality: Left;    There were no vitals filed for this visit.  Subjective Assessment - 08/14/19 1919    Subjective   Patient reports he is doing well.  "I do the best I can with the exercises."    Pertinent History  History of bilateral CTS but symptoms decrease - pt had L 4th trigger finger that required release on 04/23/2019 by Dr Rosita Kea -but then pt develop infection with I&D abscess  by Dr Ernest Pine 12/3 - had tendon repair 07/11/2019 after 4th FDP and FDS rupture    Patient Stated Goals  Want to get my hand better so  I can do my job    Currently in Pain?  Yes    Pain Score  2     Pain Location  Hand    Pain Orientation  Left    Pain Descriptors / Indicators  Aching    Pain Type  Surgical pain    Pain Onset  More than a month ago    Pain Frequency  Intermittent    Multiple Pain Sites  No         Pt with dorsal blocking splint in place on arrival.  Patient reports he has been doing the best he can with the exercises. Continued finger stiffness for MF, long finger and small finger. Patient also with stiffness in left index finger although this finger not splinted, encouraged ROM  Contrast performed to left hand and digits as per flow sheet for edema control and to increase ROM, decrease pain, 11 mins.  Continued debridement of dead/excess skin on the volar surface of the hand and digits around skin area.  Continued healing in this area. Manual therapy:  Therapist performed scar massage to proximal scar at wrist and soft issue next to distal scar, continued education for scar massage and management for home program.      Patient reports he has continued with home exercise program 5 x  day - PROM in splint with pain less than 2/10.  Discussed 4.5 week tendon protocol and changes for this week.   Patient instructed that he can progress to removing splint every 2 hours to perform exercises.   Continue with PROM DIP flexion and extension PIP flexion and extension PROM with focus on extension stretch for PIP extension with MC block at 90 flexion Composite flexion and extension performed Place and hold (3 sec) AROM composite fist     Home program with written instructions:  Remove splint Patient to perform active wrist flexion with active finger flexion followed by Active wrist extension with active finger extension Composite fist then MP extension with IP joints flexed, then IP extension Composite fist with wrist flexion, extension  Patient voices understanding and able to demonstrate home exercise  program                   Splint to be worn with removal every 2 hours to perform exercises [facility] OT Assessment - 08/13/19 1959      Left Hand AROM   L Long  MCP 0-90  80 Degrees    L Long PIP 0-100  75 Degrees    L Ring  MCP 0-90  80 Degrees    L Ring PIP 0-100  55 Degrees    L Little  MCP 0-90  90 Degrees    L Little PIP 0-100  55 Degrees               OT Treatments/Exercises (OP) - 08/13/19 2004      LUE Contrast Bath   Time  11 minutes    Comments  prior to ROM and soft tissue             OT Education - 08/14/19 1927    Education Details  protocol for flexor tendon repair ,and HEP - removal of splint for AROM exercises per 4.5 week protocol    Person(s) Educated  Patient    Methods  Explanation;Demonstration;Tactile cues;Verbal cues;Handout    Comprehension  Verbal cues required;Returned demonstration;Verbalized understanding       OT Short Term Goals - 07/14/19 2103      OT SHORT TERM GOAL #1   Title  Pt to be independent with HEP for splint wearing , PROM in splint ,edema control and incision healing    Baseline  no knowledge    Time  3    Period  Weeks    Status  New    Target Date  08/04/19        OT Long Term Goals - 07/14/19 2105      OT LONG TERM GOAL #1   Title  Pt show AROM in L hand to touch palm to use in ADl's    Baseline  Pt only PROM in splint for extention and flexion - 3 days s/p    Time  6    Period  Weeks    Status  New    Target Date  08/25/19      OT LONG TERM GOAL #2   Title  L wrist AROM increase to WNL to turn doorknob, and use in bathing    Baseline  3 days s/p - no AROM - PROM to digits in splint    Time  6    Period  Weeks    Status  New    Target Date  08/25/19      OT LONG TERM GOAL #3   Title  PRWHE function score improve with more than 20 points    Baseline  eval function at [facility] 50/50    Time  12    Period  Weeks    Status  New    Target Date  10/06/19            Plan - 08/14/19 1929     Clinical Impression Statement  Patient now 4.5 weeks s/p flexor tendon repair, incision continues to heal, decreased pain noted by pain with scar massage.  Pt reports performing HEP but continues to demonstrate increased stiffness in digits.Initiation of AROM for wrist and digits with splint removed starting this week, patient verbalizes and demonstrates understanding.  Requested patient continue with passive ROM (due to continued stiffness) as perfromed to date then add active ROM exercises outside of splint.  Reassess next session to determine progress and any changes with plan.    OT Occupational Profile and History  Problem Focused Assessment - Including review of records relating to presenting problem    Occupational performance deficits (Please refer to evaluation for details):  ADL's;IADL's;Work;Play;Leisure    Body Structure / Function / Physical Skills  ADL;Decreased knowledge of precautions;Flexibility;ROM;UE functional use;Scar mobility;Skin integrity;IADL;Pain;Strength;Edema;FMC    Rehab Potential  Good    Clinical Decision Making  Limited treatment options, no task modification necessary    Comorbidities Affecting Occupational Performance:  May have comorbidities impacting occupational performance    Modification or Assistance to Complete Evaluation   No modification of tasks or assist necessary to complete eval    OT Frequency  2x / week    OT Duration  6 weeks    OT Treatment/Interventions  Self-care/ADL training;Therapeutic exercise;Patient/family education;Splinting;Paraffin;Fluidtherapy;Contrast Bath;Manual Therapy;Passive range of motion;Scar mobilization    Consulted and Agree with Plan of Care  Patient       Patient will benefit from skilled therapeutic intervention in order to improve the following deficits and impairments:   Body Structure / Function / Physical Skills: ADL, Decreased knowledge of precautions, Flexibility, ROM, UE functional use, Scar mobility, Skin integrity,  IADL, Pain, Strength, Edema, FMC       Visit Diagnosis: Stiffness of left hand, not elsewhere classified  Muscle weakness (generalized)  Pain in left hand  Localized edema    Problem List Patient Active Problem List   Diagnosis Date Noted  . CAD (coronary artery disease) 07/05/2018  . Diabetes ([place]) 07/05/2018  . Essential hypertension 07/05/2018  . History of stroke 07/05/2018  . Carotid stenosis 07/05/2018  . Confusion   . Altered mental status 08/08/2017   Kaydie Petsch T Kermitt Harjo, OTR/L, CLT  Laaibah Wartman 08/14/2019, 8:04 PM  [facility] [address] [place], [address] Phone: [phone]   Fax:  [phone]  Name: WESS BANEY MRN: [id number] Date of Birth: 1956-05-26

## 2019-08-15 ENCOUNTER — Other Ambulatory Visit: Payer: Self-pay

## 2019-08-15 ENCOUNTER — Ambulatory Visit: Payer: BC Managed Care – PPO | Admitting: Occupational Therapy

## 2019-08-15 DIAGNOSIS — M79642 Pain in left hand: Secondary | ICD-10-CM

## 2019-08-15 DIAGNOSIS — M6281 Muscle weakness (generalized): Secondary | ICD-10-CM

## 2019-08-15 DIAGNOSIS — M25642 Stiffness of left hand, not elsewhere classified: Secondary | ICD-10-CM

## 2019-08-15 DIAGNOSIS — R600 Localized edema: Secondary | ICD-10-CM | POA: Diagnosis not present

## 2019-08-15 NOTE — Therapy (Signed)
[facility] [address], [place], [address] Phone: [phone]   Fax:  [phone]  Occupational Therapy Treatment  Patient Details  Name: Thomas Moore MRN: [id number] Date of Birth: 07/16/1955 Referring Provider (OT): Cranston Neighbor   Encounter Date: 08/15/2019  OT End of Session - 08/15/19 1314    Visit Number  9    Number of Visits  24    Date for OT Re-Evaluation  10/06/19    OT Start Time  0835    OT Stop Time  0935    OT Time Calculation (min)  60 min    Activity Tolerance  Patient tolerated treatment well    Behavior During Therapy  [facility] for tasks assessed/performed       Past Medical History:  Diagnosis Date  . Coronary artery disease   . Diabetes mellitus without complication (HCC)   . GERD (gastroesophageal reflux disease)   . History of single vessel coronary artery bypass   . Hypertension     Past Surgical History:  Procedure Laterality Date  . CORNEAL TRANSPLANT Right   . CORONARY ARTERY BYPASS GRAFT  1997  . FLEXOR TENDON REPAIR Left 07/11/2019   Procedure: FLEXOR tenolysis  REPAIR LEFT RING FINGER with tednon repair;  Surgeon: Kennedy Bucker, MD;  Location: [facility] ORS;  Service: Orthopedics;  Laterality: Left;  . INCISION AND DRAINAGE ABSCESS Left 05/08/2019   Procedure: INCISION AND DRAINAGE ABSCESS;  Surgeon: Donato Heinz, MD;  Location: [facility] ORS;  Service: Orthopedics;  Laterality: Left;    There were no vitals filed for this visit.  Subjective Assessment - 08/15/19 1308    Subjective   Doing okay -has some family issues going on - my brother was in ICU and very sick- no pain in hand - will be glad if this splint can come off    Pertinent History  History of bilateral CTS but symptoms decrease - pt had L 4th trigger finger that required release on 04/23/2019 by Dr Rosita Kea -but then pt develop infection with I&D abscess  by Dr Ernest Pine 12/3 - had tendon repair 07/11/2019 after 4th FDP and  FDS rupture    Patient Stated Goals  Want to get my hand better so I can do my job    Currently in Pain?  Yes    Pain Score  2     Pain Location  Hand    Pain Orientation  Left    Pain Descriptors / Indicators  Aching    Pain Type  Surgical pain    Pain Onset  More than a month ago    Pain Frequency  Intermittent    Aggravating Factors   PROM flexion of digits - mosty 4th         [facility] OT Assessment - 08/15/19 0001      Left Hand AROM   L Index  MCP 0-90  85 Degrees   -35   L Index PIP 0-100  85 Degrees   0   L Long  MCP 0-90  85 Degrees   -40   L Long PIP 0-100  80 Degrees   -30   L Ring  MCP 0-90  85 Degrees   -40   L Ring PIP 0-100  60 Degrees   -45   L Little  MCP 0-90  85 Degrees   -30   L Little PIP 0-100  60 Degrees   0     Removed  splint and pt did not wash hand at home yet - just doing heatingpad and scar massage in splint  Wash pt hand this date under running water -removed dry skin -and pt to take off splint to shower - but do not use hand   Pt ed on changes on HEP and assess AROM in session          OT Treatments/Exercises (OP) - 08/15/19 0001      Moist Heat Therapy   Number Minutes Moist Heat  8 Minutes    Moist Heat Location  --   inbetween session prior to AROM and PROM       scar massage done - and pt ed on doing at home- and pt demo for correct pressure- and cont cica scar pad on volar scars and digi sleeve on 4th -   AROM for wrist flexion and exention - open hand and loose fist  10 reps done in sessoin   AAROM for RD, UD 10reps - palms together but do not press against each other  tapping gentle AROM of digits - in resting position  Composite PROM of digits flexion with AROM opening - reinforce AROM extention  Tendon glides -AROM -with place and hold last step for composite fist - and block PIP flexion  10 reps all  Opposition AROM to 2nd and 3rd and PROM to 4th and 5th  8 reps  Can wash hand in shower and get splint off only  for HEP 3-5 x day  - follow precautions        OT Education - 08/15/19 1314    Education Details  protocol for flexor tendon repair ,and HEP - removal of splint for AROM exercises per 5 week protocol    Person(s) Educated  Patient    Methods  Explanation;Demonstration;Tactile cues;Verbal cues;Handout    Comprehension  Verbal cues required;Returned demonstration;Verbalized understanding       OT Short Term Goals - 07/14/19 2103      OT SHORT TERM GOAL #1   Title  Pt to be independent with HEP for splint wearing , PROM in splint ,edema control and incision healing    Baseline  no knowledge    Time  3    Period  Weeks    Status  New    Target Date  08/04/19        OT Long Term Goals - 07/14/19 2105      OT LONG TERM GOAL #1   Title  Pt show AROM in L hand to touch palm to use in ADl's    Baseline  Pt only PROM in splint for extention and flexion - 3 days s/p    Time  6    Period  Weeks    Status  New    Target Date  08/25/19      OT LONG TERM GOAL #2   Title  L wrist AROM increase to WNL to turn doorknob, and use in bathing    Baseline  3 days s/p - no AROM - PROM to digits in splint    Time  6    Period  Weeks    Status  New    Target Date  08/25/19      OT LONG TERM GOAL #3   Title  PRWHE function score improve with more than 20 points    Baseline  eval function at PRWHE 50/50    Time  12    Period  Weeks  Status  New    Target Date  10/06/19            Plan - 08/15/19 1315    Clinical Impression Statement  Pt is 5 wks s/p Flexor tendon repair on 4th - pt can per protocol come out of splint for HEP and only AROM for extention of digits and wrist- and PROM for flexion and AROM - wash pt's hand this date and apply lotion - and reed and reinforce again scar massage with pt , precautions and HEP - pt can take splint off only for HEP    OT Occupational Profile and History  Problem Focused Assessment - Including review of records relating to presenting  problem    Occupational performance deficits (Please refer to evaluation for details):  ADL's;IADL's;Work;Play;Leisure    Body Structure / Function / Physical Skills  ADL;Decreased knowledge of precautions;Flexibility;ROM;UE functional use;Scar mobility;Skin integrity;IADL;Pain;Strength;Edema;FMC    Rehab Potential  Good    Clinical Decision Making  Limited treatment options, no task modification necessary    Comorbidities Affecting Occupational Performance:  May have comorbidities impacting occupational performance    Modification or Assistance to Complete Evaluation   No modification of tasks or assist necessary to complete eval    OT Frequency  2x / week    OT Duration  6 weeks    OT Treatment/Interventions  Self-care/ADL training;Therapeutic exercise;Patient/family education;Splinting;Paraffin;Fluidtherapy;Contrast Bath;Manual Therapy;Passive range of motion;Scar mobilization    Plan  HEP changes,assess progress and scar massage    OT Home Exercise Plan  see pt instruction    Consulted and Agree with Plan of Care  Patient       Patient will benefit from skilled therapeutic intervention in order to improve the following deficits and impairments:   Body Structure / Function / Physical Skills: ADL, Decreased knowledge of precautions, Flexibility, ROM, UE functional use, Scar mobility, Skin integrity, IADL, Pain, Strength, Edema, FMC       Visit Diagnosis: Muscle weakness (generalized)  Stiffness of left hand, not elsewhere classified  Pain in left hand  Localized edema    Problem List Patient Active Problem List   Diagnosis Date Noted  . CAD (coronary artery disease) 07/05/2018  . Diabetes ([place]) 07/05/2018  . Essential hypertension 07/05/2018  . History of stroke 07/05/2018  . Carotid stenosis 07/05/2018  . Confusion   . Altered mental status 08/08/2017    Rosalyn Gess OTR/l,CLT 08/15/2019, 1:17 PM  [facility]  [facility] [address] [place], [address] Phone: [phone]   Fax:  [phone]  Name: Thomas Moore MRN: [id number] Date of Birth: [birth year]

## 2019-08-15 NOTE — Patient Instructions (Signed)
Moist heat 3-5 x day   splint off only with HEP  And do not do PROM for wrist and digits extention    AROM for wrist flexion and exention - open hand and loose fist  10 reps  AAROM for RD, UD 10reps  tapping gentle AROM of digits Composite PROM of digits flexion with AROM opening  Tendon glides -AROM -with place and hold last step for composite fist 10 reps all  Opposition AROM to 2nd and 3rd and PROM to 4th and 5th  8 reps  Can wash hand in shower and get splint on - follow precautions and pt show HEP and verbalize understanding for precautions

## 2019-08-18 ENCOUNTER — Ambulatory Visit: Payer: BC Managed Care – PPO | Admitting: Occupational Therapy

## 2019-08-18 ENCOUNTER — Other Ambulatory Visit: Payer: Self-pay

## 2019-08-18 DIAGNOSIS — M79642 Pain in left hand: Secondary | ICD-10-CM

## 2019-08-18 DIAGNOSIS — R600 Localized edema: Secondary | ICD-10-CM | POA: Diagnosis not present

## 2019-08-18 DIAGNOSIS — M6281 Muscle weakness (generalized): Secondary | ICD-10-CM

## 2019-08-18 DIAGNOSIS — M25642 Stiffness of left hand, not elsewhere classified: Secondary | ICD-10-CM

## 2019-08-18 NOTE — Patient Instructions (Signed)
same

## 2019-08-18 NOTE — Therapy (Signed)
[facility] [address] [place], [address] Phone: [phone]   Fax:  [phone]  Occupational Therapy Treatment  Patient Details  Name: Thomas Moore MRN: [id number] Date of Birth: [birth year] Referring Provider (OT): Rachelle Hora   Encounter Date: 08/18/2019  OT End of Session - 08/18/19 0752    Visit Number  10    Number of Visits  24    Date for OT Re-Evaluation  10/06/19    OT Start Time  0746    OT Stop Time  0830    OT Time Calculation (min)  44 min    Activity Tolerance  Patient tolerated treatment well    Behavior During Therapy  [facility] for tasks assessed/performed       Past Medical History:  Diagnosis Date  . Coronary artery disease   . Diabetes mellitus without complication ([place])   . GERD (gastroesophageal reflux disease)   . History of single vessel coronary artery bypass   . Hypertension     Past Surgical History:  Procedure Laterality Date  . CORNEAL TRANSPLANT Right   . CORONARY ARTERY BYPASS GRAFT  1997  . FLEXOR TENDON REPAIR Left 07/11/2019   Procedure: FLEXOR tenolysis  REPAIR LEFT RING FINGER with tednon repair;  Surgeon: Hessie Knows, MD;  Location: [facility] OR[facility];  Service: Orthopedics;  Laterality: Left;  . INCISION AND DRAINAGE ABSCESS Left 05/08/2019   Procedure: INCISION AND DRAINAGE ABSCESS;  Surgeon: Dereck Leep, MD;  Location: [facility] ORS;  Service: Orthopedics;  Laterality: Left;    There were no vitals filed for this visit.  Subjective Assessment - 08/18/19 0747    Subjective   Doing okay -some pain but only with the exercises- use some warm water part of time - done the best I can with exercises    Pertinent History  History of bilateral CTS but symptoms decrease - pt had L 4th trigger finger that required release on 04/23/2019 by Dr Rudene Christians -but then pt develop infection with I&D abscess  by Dr Marry Guan 12/3 - had tendon repair 07/11/2019 after 4th FDP and FDS rupture    Patient Stated Goals  Want to get my hand better so I can do my job    Currently in Pain?  Yes    Pain Score  2     Pain Location  Hand    Pain Orientation  Left    Pain Descriptors / Indicators  Aching;Tightness    Pain Type  Surgical pain    Pain Onset  More than a month ago    Pain Frequency  Intermittent               Pt remove splint coming in - digits very stiff  And tight  Swelling in 4th and palm     OT Treatments/Exercises (OP) - 08/18/19 0001      LUE Contrast Bath   Time  9 minutes    Comments  prior to ROM - for gentle extention stretch      scar massage done - used this date vibration on volar scar on 4th and palm  As well as graston brushing with tool nr 2 on 2nd , 3rd and 5th with gentle PROM  On AROM extention of 4th   pt ed on doing at home- and pt demo for correct pressure- and cont cica scar pad on volar scars and silicondigi sleeve on 4th - new one provided   AROM for  wrist flexion and exention - open hand and loose fist  10 reps done in session   AAROM for RD, UD 10reps - palms together but do not press against each other  tapping gentle AROM of digits - in resting position  Composite PROM of digits flexion with AROM opening - reinforce AROM extention  And REINFORCE PROM COMPOSITE FLEXION  Tendon glides -AROM -with place and hold last step for composite fist - and block PIP flexion  10 reps all  Opposition AROM to 2nd and 3rd and PROM to 4th and 5th  8 reps  Can wash hand in shower and get splint off only for HEP 3-5 x day  - follow precautions           OT Education - 08/18/19 0752    Education Details  same - review HEP per protocol for flexor tendon repair  - removal of splint for AROM exercises per 5 week protocol    Person(s) Educated  Patient    Methods  Explanation;Demonstration;Tactile cues;Verbal cues;Handout    Comprehension  Verbal cues required;Returned demonstration;Verbalized understanding       OT Short Term  Goals - 07/14/19 2103      OT SHORT TERM GOAL #1   Title  Pt to be independent with HEP for splint wearing , PROM in splint ,edema control and incision healing    Baseline  no knowledge    Time  3    Period  Weeks    Status  New    Target Date  08/04/19        OT Long Term Goals - 07/14/19 2105      OT LONG TERM GOAL #1   Title  Pt show AROM in L hand to touch palm to use in ADl's    Baseline  Pt only PROM in splint for extention and flexion - 3 days s/p    Time  6    Period  Weeks    Status  New    Target Date  08/25/19      OT LONG TERM GOAL #2   Title  L wrist AROM increase to WNL to turn doorknob, and use in bathing    Baseline  3 days s/p - no AROM - PROM to digits in splint    Time  6    Period  Weeks    Status  New    Target Date  08/25/19      OT LONG TERM GOAL #3   Title  PRWHE function score improve with more than 20 points    Baseline  eval function at [facility] 50/50    Time  12    Period  Weeks    Status  New    Target Date  10/06/19            Plan - 08/18/19 0752    Clinical Impression Statement  Pt is 5 1/2 wks s/p Flexor tendon repair on 4th - pt arrive every time with stiffness and swelling-  reinforce for pt to use contrast , wash and lotion hand - and focus on composite flexion of digits to palm - but AROM extention of 4th - ? follow thru with HEP at home as per HEP - scar adhesions ,edema and stiffness limiting flexion and extention of digits    OT Occupational Profile and History  Problem Focused Assessment - Including review of records relating to presenting problem    Occupational performance deficits (Please refer to  evaluation for details):  ADL's;IADL's;Work;Play;Leisure    Body Structure / Function / Physical Skills  ADL;Decreased knowledge of precautions;Flexibility;ROM;UE functional use;Scar mobility;Skin integrity;IADL;Pain;Strength;Edema;FMC    Rehab Potential  Good    Clinical Decision Making  Limited treatment options, no task  modification necessary    Comorbidities Affecting Occupational Performance:  May have comorbidities impacting occupational performance    Modification or Assistance to Complete Evaluation   No modification of tasks or assist necessary to complete eval    OT Frequency  2x / week    OT Duration  4 weeks    OT Treatment/Interventions  Self-care/ADL training;Therapeutic exercise;Patient/family education;Splinting;Paraffin;Fluidtherapy;Contrast Bath;Manual Therapy;Passive range of motion;Scar mobilization    Plan  HEP changes,assess progress and scar massage    OT Home Exercise Plan  see pt instruction    Consulted and Agree with Plan of Care  Patient       Patient will benefit from skilled therapeutic intervention in order to improve the following deficits and impairments:   Body Structure / Function / Physical Skills: ADL, Decreased knowledge of precautions, Flexibility, ROM, UE functional use, Scar mobility, Skin integrity, IADL, Pain, Strength, Edema, FMC       Visit Diagnosis: Muscle weakness (generalized)  Stiffness of left hand, not elsewhere classified  Pain in left hand  Localized edema    Problem List Patient Active Problem List   Diagnosis Date Noted  . CAD (coronary artery disease) 07/05/2018  . Diabetes (H[facility]) 07/05/2018  . Essential hypertension 07/05/2018  . History of stroke 07/05/2018  . Carotid stenosis 07/05/2018  . Confusion   . Altered mental status 08/08/2017    Oletta Cohn OTR/L,CLT 08/18/2019, 10:43 AM  [facility] [address] [place], [address] Phone: [phone]   Fax:  [phone]  Name: Thomas Moore MRN: [id number] Date of Birth: 1956-03-17

## 2019-08-21 ENCOUNTER — Other Ambulatory Visit: Payer: Self-pay

## 2019-08-21 ENCOUNTER — Ambulatory Visit: Payer: BC Managed Care – PPO | Admitting: Occupational Therapy

## 2019-08-21 DIAGNOSIS — M6281 Muscle weakness (generalized): Secondary | ICD-10-CM

## 2019-08-21 DIAGNOSIS — M25642 Stiffness of left hand, not elsewhere classified: Secondary | ICD-10-CM

## 2019-08-21 DIAGNOSIS — R600 Localized edema: Secondary | ICD-10-CM

## 2019-08-21 DIAGNOSIS — M79642 Pain in left hand: Secondary | ICD-10-CM

## 2019-08-21 NOTE — Patient Instructions (Signed)
Add to HEP gentle PROM for wrist flexion and extention  And gentle PROM to digits extentoin  and use of LMB extention splint on 4th and 3rd - during contrast at home  Can leave splint off during day - on for out and about and night time to wear  cannot use for any heavy gripping or squeeizing  Only light activates and pain free  Rest hand in open hand -and work during day composite fist  Cont with same HEP for scar and digits

## 2019-08-21 NOTE — Therapy (Signed)
[facility] [address] [place], [address] Phone: [phone]   Fax:  [phone]  Occupational Therapy Treatment  Patient Details  Name: Thomas Moore MRN: [id number] Date of Birth: 07-24-1955 Referring Provider (OT): Cranston Neighbor   Encounter Date: 08/21/2019  OT End of Session - 08/21/19 1541    Visit Number  11    Number of Visits  24    Date for OT Re-Evaluation  10/06/19    OT Start Time  1520    OT Stop Time  1621    OT Time Calculation (min)  61 min    Activity Tolerance  Patient tolerated treatment well    Behavior During Therapy  [facility] for tasks assessed/performed       Past Medical History:  Diagnosis Date  . Coronary artery disease   . Diabetes mellitus without complication ([facility])   . GERD (gastroesophageal reflux disease)   . History of single vessel coronary artery bypass   . Hypertension     Past Surgical History:  Procedure Laterality Date  . CORNEAL TRANSPLANT Right   . CORONARY ARTERY BYPASS GRAFT  1997  . FLEXOR TENDON REPAIR Left 07/11/2019   Procedure: FLEXOR tenolysis  REPAIR LEFT RING FINGER with tednon repair;  Surgeon: Kennedy Bucker, MD;  Location: [facility];  Service: Orthopedics;  Laterality: Left;  . INCISION AND DRAINAGE ABSCESS Left 05/08/2019   Procedure: INCISION AND DRAINAGE ABSCESS;  Surgeon: Donato Heinz, MD;  Location: [facility];  Service: Orthopedics;  Laterality: Left;    There were no vitals filed for this visit.  Subjective Assessment - 08/21/19 1537    Subjective   I just took for the first time my splint off for showering- I just did not feel comfortable before    Pertinent History  History of bilateral CTS but symptoms decrease - pt had L 4th trigger finger that required release on 04/23/2019 by Dr Rosita Kea -but then pt develop infection with I&D abscess  by Dr Ernest Pine 12/3 - had tendon repair 07/11/2019 after 4th FDP and FDS rupture    Patient Stated Goals  Want  to get my hand better so I can do my job    Currently in Pain?  Yes    Pain Score  2     Pain Location  Hand    Pain Orientation  Left    Pain Descriptors / Indicators  Aching;Tightness    Pain Type  Surgical pain    Pain Onset  More than a month ago    Pain Frequency  Intermittent         [facility] OT Assessment - 08/21/19 0001      AROM   Left Wrist Extension  55 Degrees    Left Wrist Flexion  55 Degrees    Left Wrist Radial Deviation  10 Degrees    Left Wrist Ulnar Deviation  20 Degrees      Left Hand AROM   L Index  MCP 0-90  90 Degrees   -30   L Index PIP 0-100  90 Degrees   0   L Long  MCP 0-90  85 Degrees   -35   L Long PIP 0-100  90 Degrees   -35   L Ring  MCP 0-90  85 Degrees   -35   L Ring PIP 0-100  60 Degrees   -35   L Little  MCP 0-90  85 Degrees   -25  L Little PIP 0-100  60 Degrees   -20      Pt show stiffness in extention and flexion in R hand - pt tomorrow 6 wks s/p and per protocol and discharge splint during day - but to not use hand in heavy activities , gripping and pulling  Night time still splint  Pt verbalize understanding     show increase scar adhesions and edema in hand - ? Follow thru with HEP     OT Treatments/Exercises (OP) - 08/21/19 0001      LUE Contrast Bath   Time  8 minutes    Comments  prior to soft tissue and ROM  wiht LMB extentio nsplint on 4th and 3rd       pt ed on using of LMB splint for PIP extention at 4th and 3rd PIP during contrast   pt demo to put on correctly     gentle PROM for wrist flexion and extention and pt ed on doing at  Home  And gentle PROM to digits extention  and use of LMB extention splint on 4th and 3rd - during contrast at home  Can leave splint off during day - on for out and about and night time to wear  cannot use for any heavy gripping or squeeizing  Only light activates and pain free  Rest hand in open hand -and work during day composite fist  Cont with same HEP for scar and digits     Scar mssage done - used this date vibration on volar scar on 4th and palm  As well as graston brushing with tool nr 2 on 2nd , 3rd and 5th with gentle PROM  On AROM extention extention and composite flexion to palm  Reinforce with pt to touch palm with PROM at home and then place and hold    Tendon glides -AROM -with place and hold last step for composite fist- and block PIP flexion 10 reps all  Opposition AROM to 2nd and 3rd and PROM to 4th and 5th  8 reps     OT Education - 08/21/19 1833    Education Details  HEP changes and precautions    Person(s) Educated  Patient    Methods  Explanation;Demonstration;Tactile cues;Verbal cues;Handout    Comprehension  Verbal cues required;Returned demonstration;Verbalized understanding       OT Short Term Goals - 07/14/19 2103      OT SHORT TERM GOAL #1   Title  Pt to be independent with HEP for splint wearing , PROM in splint ,edema control and incision healing    Baseline  no knowledge    Time  3    Period  Weeks    Status  New    Target Date  08/04/19        OT Long Term Goals - 07/14/19 2105      OT LONG TERM GOAL #1   Title  Pt show AROM in L hand to touch palm to use in ADl's    Baseline  Pt only PROM in splint for extention and flexion - 3 days s/p    Time  6    Period  Weeks    Status  New    Target Date  08/25/19      OT LONG TERM GOAL #2   Title  L wrist AROM increase to WNL to turn doorknob, and use in bathing    Baseline  3 days s/p - no AROM - PROM to digits  in splint    Time  6    Period  Weeks    Status  New    Target Date  08/25/19      OT LONG TERM GOAL #3   Title  PRWHE function score improve with more than 20 points    Baseline  eval function at [facility] 50/50    Time  12    Period  Weeks    Status  New    Target Date  10/06/19            Plan - 08/21/19 1541    Clinical Impression Statement  Pt is tomorrow 6 wks s/p Flexore tendon repair of 4th = had trigger finger release and surgery of  infection before than - pt can per protocol come out of splint and only wear dorsal block splint at night time and heavy activities n -can use hand in light activities and add gentle PROM to digits extention and wrist - pt show scar adhesion and stiffness -? pt's follow thru wtih HEP at home    OT Occupational Profile and History  Problem Focused Assessment - Including review of records relating to presenting problem    Occupational performance deficits (Please refer to evaluation for details):  ADL's;IADL's;Work;Play;Leisure    Body Structure / Function / Physical Skills  ADL;Decreased knowledge of precautions;Flexibility;ROM;UE functional use;Scar mobility;Skin integrity;IADL;Pain;Strength;Edema;FMC    Rehab Potential  Good    Clinical Decision Making  Limited treatment options, no task modification necessary    Comorbidities Affecting Occupational Performance:  May have comorbidities impacting occupational performance    Modification or Assistance to Complete Evaluation   No modification of tasks or assist necessary to complete eval    OT Frequency  2x / week    OT Duration  --   3wks   OT Treatment/Interventions  Self-care/ADL training;Therapeutic exercise;Patient/family education;Splinting;Paraffin;Fluidtherapy;Contrast Bath;Manual Therapy;Passive range of motion;Scar mobilization    Plan  HEP changes,assess progress and scar massage    OT Home Exercise Plan  see pt instruction    Consulted and Agree with Plan of Care  Patient       Patient will benefit from skilled therapeutic intervention in order to improve the following deficits and impairments:   Body Structure / Function / Physical Skills: ADL, Decreased knowledge of precautions, Flexibility, ROM, UE functional use, Scar mobility, Skin integrity, IADL, Pain, Strength, Edema, FMC       Visit Diagnosis: Muscle weakness (generalized)  Stiffness of left hand, not elsewhere classified  Pain in left hand  Localized  edema    Problem List Patient Active Problem List   Diagnosis Date Noted  . CAD (coronary artery disease) 07/05/2018  . Diabetes ([place]) 07/05/2018  . Essential hypertension 07/05/2018  . History of stroke 07/05/2018  . Carotid stenosis 07/05/2018  . Confusion   . Altered mental status 08/08/2017    Rosalyn Gess OTR/L,CLT 08/21/2019, 6:38 PM  [facility] [address] [place], [address] Phone: [phone]   Fax:  [phone]  Name: Thomas Moore MRN: [id number] Date of Birth: 1955-12-18

## 2019-08-22 DIAGNOSIS — M6530 Trigger finger, unspecified finger: Secondary | ICD-10-CM | POA: Insufficient documentation

## 2019-08-25 ENCOUNTER — Other Ambulatory Visit: Payer: Self-pay

## 2019-08-25 ENCOUNTER — Ambulatory Visit: Payer: BC Managed Care – PPO | Admitting: Occupational Therapy

## 2019-08-25 DIAGNOSIS — M25642 Stiffness of left hand, not elsewhere classified: Secondary | ICD-10-CM

## 2019-08-25 DIAGNOSIS — M6281 Muscle weakness (generalized): Secondary | ICD-10-CM

## 2019-08-25 DIAGNOSIS — R600 Localized edema: Secondary | ICD-10-CM

## 2019-08-25 DIAGNOSIS — M79642 Pain in left hand: Secondary | ICD-10-CM

## 2019-08-25 NOTE — Patient Instructions (Signed)
Add rolling of putty for digits extention and scar massage to pt - 20 reps prior to tapping of digits  REINFORCE and Pt verbalize understanding - NO squeezing

## 2019-08-25 NOTE — Therapy (Signed)
[facility] [address], [place], [address] Phone: [phone]   Fax:  [phone]  Occupational Therapy Treatment  Patient Details  Name: Thomas Moore MRN: [id number] Date of Birth: December 19, 1955 Referring Provider (OT): Rachelle Hora   Encounter Date: 08/25/2019  OT End of Session - 08/25/19 0743    Visit Number  12    Number of Visits  24    Date for OT Re-Evaluation  10/06/19    OT Start Time  0744    OT Stop Time  0825    OT Time Calculation (min)  41 min    Activity Tolerance  Patient tolerated treatment well    Behavior During Therapy  [facility] for tasks assessed/performed       Past Medical History:  Diagnosis Date  . Coronary artery disease   . Diabetes mellitus without complication ([place])   . GERD (gastroesophageal reflux disease)   . History of single vessel coronary artery bypass   . Hypertension     Past Surgical History:  Procedure Laterality Date  . CORNEAL TRANSPLANT Right   . CORONARY ARTERY BYPASS GRAFT  1997  . FLEXOR TENDON REPAIR Left 07/11/2019   Procedure: FLEXOR tenolysis  REPAIR LEFT RING FINGER with tednon repair;  Surgeon: Hessie Knows, MD;  Location: [facility] ORS;  Service: Orthopedics;  Laterality: Left;  . INCISION AND DRAINAGE ABSCESS Left 05/08/2019   Procedure: INCISION AND DRAINAGE ABSCESS;  Surgeon: Dereck Leep, MD;  Location: [facility] ORS;  Service: Orthopedics;  Laterality: Left;    There were no vitals filed for this visit.  Subjective Assessment - 08/25/19 0743    Subjective   Seen Dr Rudene Christians - need to go back in 4 wks - I tried to use my hand - keep splint on when out and about and sleeping    Pertinent History  History of bilateral CTS but symptoms decrease - pt had L 4th trigger finger that required release on 04/23/2019 by Dr Rudene Christians -but then pt develop infection with I&D abscess  by Dr Marry Guan 12/3 - had tendon repair 07/11/2019 after 4th FDP and FDS rupture    Patient  Stated Goals  Want to get my hand better so I can do my job    Currently in Pain?  No/denies         Pt from being in splint whole night and driving over here - but pt up since 5h30 - reinforce keeping splint off during day - to do HEP and use in light activities   stiffness in extention and flexion in R hand - pt 6 1/2 wks s/p and per protocol  discharge splint during day at 6 wks  - but to not use hand in heavy activities , gripping and pulling  Night time still splint  Pt verbalize understanding again  show increase scar adhesions and edema in hand - ? Follow thru with HEP            OT Treatments/Exercises (OP) - 08/25/19 0001      LUE Paraffin   Number Minutes Paraffin  8 Minutes    LUE Paraffin Location  Hand    Comments  LMB splint for PIP extention of 5th and 3rd         pt ed on using of LMB splint for PIP extention at 4th and 3rd PIP during contrast or heat at home again   pt demo to put on correctly last time  gentle PROM for wrist flexion and extention and pt ed on doing at  Home  And gentle PROM to digits extention  and use of LMB extention splint on 4th and 3rd -  at home  Can leave splint off during day - on for out and about and night time to wear  cannot use for any heavy gripping or squeeizing  Only light activates and pain free  Rest hand in open hand -and work during day composite fist  HEP at least 3-5 x day  Cont with same HEP for scar and digits    Scar mssage done -used this date vibration on volar scar on 4th and palm  As well as graston brushing with tool nr 2 on 2nd , 3rd and 5th with gentle PROM  On AROM extention  and composite flexion to palm  Reinforce with pt to touch palm with PROM at home and then place and hold   Done some rolling of teal med putty for digits extention gentle stretch and scar massage - 20 reps and add to HEP    Tendon glides -AROM -with place and hold last step for composite fist- and block PIP  flexion Initially 55 degrees place and hold on 4th - then 60 end of session  10 reps all  Opposition AROM to 2nd and 3rd and PROM to 4th and 5th  8 reps      OT Education - 08/25/19 0742    Education Details  reinforce HEP and add rolling of putty    Person(s) Educated  Patient    Methods  Explanation;Demonstration;Tactile cues;Verbal cues;Handout    Comprehension  Verbal cues required;Returned demonstration;Verbalized understanding       OT Short Term Goals - 07/14/19 2103      OT SHORT TERM GOAL #1   Title  Pt to be independent with HEP for splint wearing , PROM in splint ,edema control and incision healing    Baseline  no knowledge    Time  3    Period  Weeks    Status  New    Target Date  08/04/19        OT Long Term Goals - 07/14/19 2105      OT LONG TERM GOAL #1   Title  Pt show AROM in L hand to touch palm to use in ADl's    Baseline  Pt only PROM in splint for extention and flexion - 3 days s/p    Time  6    Period  Weeks    Status  New    Target Date  08/25/19      OT LONG TERM GOAL #2   Title  L wrist AROM increase to WNL to turn doorknob, and use in bathing    Baseline  3 days s/p - no AROM - PROM to digits in splint    Time  6    Period  Weeks    Status  New    Target Date  08/25/19      OT LONG TERM GOAL #3   Title  PRWHE function score improve with more than 20 points    Baseline  eval function at [facility] 50/50    Time  12    Period  Weeks    Status  New    Target Date  10/06/19            Plan - 08/25/19 0743    Clinical Impression Statement  Pt is 6  1/2 wks s/p R Flexor tendon repair on 4th - had trigger finger release and surgery for infection in past - pt cont to come with stiffness in flexion and extentoin of 3rd thru 5 th -REINFORCE for pt to do HEP at least 3 day and keep splint off during day - only night time and heavy activities - reinforce composite flexion and then extention of digits - ? follow thru at home with HEP    OT  Occupational Profile and History  Problem Focused Assessment - Including review of records relating to presenting problem    Occupational performance deficits (Please refer to evaluation for details):  ADL's;IADL's;Work;Play;Leisure    Body Structure / Function / Physical Skills  ADL;Decreased knowledge of precautions;Flexibility;ROM;UE functional use;Scar mobility;Skin integrity;IADL;Pain;Strength;Edema;FMC    Rehab Potential  Good    Clinical Decision Making  Limited treatment options, no task modification necessary    Comorbidities Affecting Occupational Performance:  May have comorbidities impacting occupational performance    Modification or Assistance to Complete Evaluation   No modification of tasks or assist necessary to complete eval    OT Frequency  2x / week    OT Duration  --   3 wks   OT Treatment/Interventions  Self-care/ADL training;Therapeutic exercise;Patient/family education;Splinting;Paraffin;Fluidtherapy;Contrast Bath;Manual Therapy;Passive range of motion;Scar mobilization    Plan  HEP changes,assess progress and scar massage    OT Home Exercise Plan  see pt instruction    Consulted and Agree with Plan of Care  Patient       Patient will benefit from skilled therapeutic intervention in order to improve the following deficits and impairments:   Body Structure / Function / Physical Skills: ADL, Decreased knowledge of precautions, Flexibility, ROM, UE functional use, Scar mobility, Skin integrity, IADL, Pain, Strength, Edema, FMC       Visit Diagnosis: Muscle weakness (generalized)  Stiffness of left hand, not elsewhere classified  Pain in left hand  Localized edema    Problem List Patient Active Problem List   Diagnosis Date Noted  . CAD (coronary artery disease) 07/05/2018  . Diabetes (HCC) 07/05/2018  . Essential hypertension 07/05/2018  . History of stroke 07/05/2018  . Carotid stenosis 07/05/2018  . Confusion   . Altered mental status 08/08/2017     Oletta Cohn OTR/L,CLT 08/25/2019, 8:29 AM  [facility] [address] [place], [address] Phone: [phone]   Fax:  [phone]  Name: Thomas Moore MRN: [id number] Date of Birth: 1955-06-30

## 2019-08-29 ENCOUNTER — Ambulatory Visit: Payer: BC Managed Care – PPO | Admitting: Occupational Therapy

## 2019-08-29 ENCOUNTER — Other Ambulatory Visit: Payer: Self-pay

## 2019-08-29 DIAGNOSIS — M6281 Muscle weakness (generalized): Secondary | ICD-10-CM

## 2019-08-29 DIAGNOSIS — M79642 Pain in left hand: Secondary | ICD-10-CM

## 2019-08-29 DIAGNOSIS — R600 Localized edema: Secondary | ICD-10-CM

## 2019-08-29 DIAGNOSIS — M25642 Stiffness of left hand, not elsewhere classified: Secondary | ICD-10-CM

## 2019-08-29 NOTE — Patient Instructions (Signed)
Pt to wear hand base extention splint on L hand night time , up and about and 2 hrs in middle of day for extention stretch  Same HEP

## 2019-08-29 NOTE — Therapy (Signed)
[facility] [address], [place], [address] Phone: [phone]   Fax:  [phone]  Occupational Therapy Treatment  Patient Details  Name: Thomas Moore MRN: [id number] Date of Birth: 07-21-1955 Referring Provider (OT): Rachelle Hora   Encounter Date: 08/29/2019  OT End of Session - 08/29/19 1253    Visit Number  13    Number of Visits  24    Date for OT Re-Evaluation  10/06/19    OT Start Time  0835    OT Stop Time  0919    OT Time Calculation (min)  44 min    Activity Tolerance  Patient tolerated treatment well    Behavior During Therapy  [facility] for tasks assessed/performed       Past Medical History:  Diagnosis Date  . Coronary artery disease   . Diabetes mellitus without complication ([place])   . GERD (gastroesophageal reflux disease)   . History of single vessel coronary artery bypass   . Hypertension     Past Surgical History:  Procedure Laterality Date  . CORNEAL TRANSPLANT Right   . CORONARY ARTERY BYPASS GRAFT  1997  . FLEXOR TENDON REPAIR Left 07/11/2019   Procedure: FLEXOR tenolysis  REPAIR LEFT RING FINGER with tednon repair;  Surgeon: Hessie Knows, MD;  Location: [facility];  Service: Orthopedics;  Laterality: Left;  . INCISION AND DRAINAGE ABSCESS Left 05/08/2019   Procedure: INCISION AND DRAINAGE ABSCESS;  Surgeon: Dereck Leep, MD;  Location: [facility] OR[facility];  Service: Orthopedics;  Laterality: Left;    There were no vitals filed for this visit.  Subjective Assessment - 08/29/19 0835    Subjective   only some pain with exercises - did roll the putty - keep splint off when at home - still sleepingwith it - I try my best to do exercises - do not get my finger to palm like you    Pertinent History  History of bilateral CTS but symptoms decrease - pt had L 4th trigger finger that required release on 04/23/2019 by Dr Rudene Christians -but then pt develop infection with I&D abscess  by Dr Marry Guan 12/3 - had tendon  repair 07/11/2019 after 4th FDP and FDS rupture    Patient Stated Goals  Want to get my hand better so I can do my job    Currently in Pain?  No/denies                  Pt arrive again this date with splint on since night time -did not take if off prior - and stiff in flexion and extention of digits  Scar thick and adhere   OT Treatments/Exercises (OP) - 08/29/19 0001      LUE Paraffin   Number Minutes Paraffin  8 Minutes    LUE Paraffin Location  Hand    Comments  LMB splint on 4th this date with  3 lbs on hand with heatingpad for slow extention stretch on L hand       fabricated hand base extention splint for R hand 3rd thru 5th - to wear night time , out and about and 2 hrs middle of day    Scar mssage done -used this date vibration on volar scar on 4th and palm  As well as graston brushing with tool nr 2 on 2nd ,  3rd and 5th with gentle PROM/ AROM extention and composite flexion to palm  Reinforce with pt to touch palm with PROM  at home and then place and hold  Done some rolling of teal med putty for digits extention gentle stretch and scar massage - 20 reps and add to HEP   Tendon glides -AROM -with place and hold last step for composite fist- and block PIP flexion  PIP flexion hold - 55 degrees place and hold on 4th -75 at 5th and 80 at 2nd and 3rd   Opposition AROM to 2nd and 3rd and PROM to 4th and 5th  8 reps  pt ed on using of LMB splint for PIP extention at 4th and 3rd PIP during contrast or heat at home again  pt demo to put on correctly last week  PROM for wrist flexion and extention done by OT  And PROM to digits extention 2nd , 3rd and 5th -and gentle on 4th  and use of LMB extention splint on 4th and 3rd -  at home   cannot use for any heavy gripping or squeeizing  Only light activates and pain free  Rest hand in open hand -and work during day composite fist to touch palm  HEP at least 3-5 x day  Cont with same HEP for scar and  digits        OT Education - 08/29/19 1253    Education Details  change to static extention splint for digits ,and reinforce composite fist PROM and place and hold , scar massage    Person(s) Educated  Patient    Methods  Explanation;Demonstration;Tactile cues;Verbal cues;Handout    Comprehension  Verbal cues required;Returned demonstration;Verbalized understanding       OT Short Term Goals - 07/14/19 2103      OT SHORT TERM GOAL #1   Title  Pt to be independent with HEP for splint wearing , PROM in splint ,edema control and incision healing    Baseline  no knowledge    Time  3    Period  Weeks    Status  New    Target Date  08/04/19        OT Long Term Goals - 07/14/19 2105      OT LONG TERM GOAL #1   Title  Pt show AROM in L hand to touch palm to use in ADl's    Baseline  Pt only PROM in splint for extention and flexion - 3 days s/p    Time  6    Period  Weeks    Status  New    Target Date  08/25/19      OT LONG TERM GOAL #2   Title  L wrist AROM increase to WNL to turn doorknob, and use in bathing    Baseline  3 days s/p - no AROM - PROM to digits in splint    Time  6    Period  Weeks    Status  New    Target Date  08/25/19      OT LONG TERM GOAL #3   Title  PRWHE function score improve with more than 20 points    Baseline  eval function at [facility] 50/50    Time  12    Period  Weeks    Status  New    Target Date  10/06/19            Plan - 08/29/19 1254    Clinical Impression Statement  Pt is 7 wks s/p R flexor tendon repair on 4th - pt had trigger finger release and surgery for infection  in past on 4th - pt arrive again this date with stiffness - report did not take splint off since getting up - REINFORCE again with pt to do scar massage , composite flexion PROM and place and hold , wrist AROM and extention stretch for digits - fabricate this date hand base static splint for extention stretch to wear night time, out and about and 2 hrs in middle of  day    OT Occupational Profile and History  Problem Focused Assessment - Including review of records relating to presenting problem    Occupational performance deficits (Please refer to evaluation for details):  ADL's;IADL's;Work;Play;Leisure    Body Structure / Function / Physical Skills  ADL;Decreased knowledge of precautions;Flexibility;ROM;UE functional use;Scar mobility;Skin integrity;IADL;Pain;Strength;Edema;FMC    Rehab Potential  Good    Clinical Decision Making  Limited treatment options, no task modification necessary    Comorbidities Affecting Occupational Performance:  May have comorbidities impacting occupational performance    Modification or Assistance to Complete Evaluation   No modification of tasks or assist necessary to complete eval    OT Frequency  2x / week    OT Duration  --   3 wks   OT Treatment/Interventions  Self-care/ADL training;Therapeutic exercise;Patient/family education;Splinting;Paraffin;Fluidtherapy;Contrast Bath;Manual Therapy;Passive range of motion;Scar mobilization    Plan  HEP changes,assess progress and scar massage and tolerance to new splint    OT Home Exercise Plan  see pt instruction    Consulted and Agree with Plan of Care  Patient       Patient will benefit from skilled therapeutic intervention in order to improve the following deficits and impairments:   Body Structure / Function / Physical Skills: ADL, Decreased knowledge of precautions, Flexibility, ROM, UE functional use, Scar mobility, Skin integrity, IADL, Pain, Strength, Edema, FMC       Visit Diagnosis: Muscle weakness (generalized)  Stiffness of left hand, not elsewhere classified  Pain in left hand  Localized edema    Problem List Patient Active Problem List   Diagnosis Date Noted  . CAD (coronary artery disease) 07/05/2018  . Diabetes (HCC) 07/05/2018  . Essential hypertension 07/05/2018  . History of stroke 07/05/2018  . Carotid stenosis 07/05/2018  . Confusion   .  Altered mental status 08/08/2017    Oletta Cohn  OTR/l,CLT 08/29/2019, 12:57 PM  [facility] [address] [place], [address] Phone: [phone]   Fax:  [phone]  Name: Thomas Moore MRN: [id number] Date of Birth: 26-Feb-1956

## 2019-09-01 ENCOUNTER — Ambulatory Visit: Payer: BC Managed Care – PPO | Admitting: Occupational Therapy

## 2019-09-01 ENCOUNTER — Other Ambulatory Visit: Payer: Self-pay

## 2019-09-01 ENCOUNTER — Encounter: Payer: Self-pay | Admitting: Occupational Therapy

## 2019-09-01 DIAGNOSIS — R600 Localized edema: Secondary | ICD-10-CM | POA: Diagnosis not present

## 2019-09-01 DIAGNOSIS — M79642 Pain in left hand: Secondary | ICD-10-CM

## 2019-09-01 DIAGNOSIS — M6281 Muscle weakness (generalized): Secondary | ICD-10-CM

## 2019-09-01 DIAGNOSIS — M25642 Stiffness of left hand, not elsewhere classified: Secondary | ICD-10-CM

## 2019-09-01 NOTE — Therapy (Signed)
[facility] [address] [place], [address] Phone: [phone]   Fax:  [phone]  Occupational Therapy Treatment  Patient Details  Name: Thomas Moore MRN: [id number] Date of Birth: June 08, 1955 Referring Provider (OT): Cranston Neighbor   Encounter Date: 09/01/2019  OT End of Session - 09/01/19 0810    Visit Number  14    Number of Visits  24    Date for OT Re-Evaluation  10/06/19    OT Start Time  0800    OT Stop Time  0845    OT Time Calculation (min)  45 min    Activity Tolerance  Patient tolerated treatment well    Behavior During Therapy  [facility] for tasks assessed/performed       Past Medical History:  Diagnosis Date  . Coronary artery disease   . Diabetes mellitus without complication (HCC)   . GERD (gastroesophageal reflux disease)   . History of single vessel coronary artery bypass   . Hypertension     Past Surgical History:  Procedure Laterality Date  . CORNEAL TRANSPLANT Right   . CORONARY ARTERY BYPASS GRAFT  1997  . FLEXOR TENDON REPAIR Left 07/11/2019   Procedure: FLEXOR tenolysis  REPAIR LEFT RING FINGER with tednon repair;  Surgeon: Kennedy Bucker, MD;  Location: [facility] ORS;  Service: Orthopedics;  Laterality: Left;  . INCISION AND DRAINAGE ABSCESS Left 05/08/2019   Procedure: INCISION AND DRAINAGE ABSCESS;  Surgeon: Donato Heinz, MD;  Location: [facility] ORS;  Service: Orthopedics;  Laterality: Left;    There were no vitals filed for this visit.  Subjective Assessment - 09/01/19 0806    Subjective   Patient reports he did exercises over the weekend, has been using small finger splint at home with heat prior to exercises at home.  Has been doing putty for rolling. Denies any pain at rest but some pain with exercises.  Wearing splint intermittently.    Pertinent History  History of bilateral CTS but symptoms decrease - pt had L 4th trigger finger that required release on 04/23/2019 by Dr Rosita Kea -but  then pt develop infection with I&D abscess  by Dr Ernest Pine 12/3 - had tendon repair 07/11/2019 after 4th FDP and FDS rupture    Patient Stated Goals  Want to get my hand better so I can do my job    Currently in Pain?  No/denies    Pain Score  0-No pain    Multiple Pain Sites  No      Paraffin Bath for 8 mins with LMB extension splint on 3rd and 5th finger prior to manual therapy.  Manual therapy: Scar massage performed with manual techniques by therapist, use of  vibration on volar scar on 4th and palm.  Use of graston brushing with tool nr 2 on 2nd ,  3rd and 5th with gentle PROM/ AROM extension  and composite flexion to palm  Continue to Reinforce with pt to touch palm with PROM at home and then place and hold   Therapeutic Exercise:   Tendon glides -AROM -with place and hold last step for composite fist - and block PIP flexion   PIP flexion hold - 55 degrees place and hold on 4th -75 at 5th and 80 at 2nd and 3rd    Opposition AROM to 2nd and 3rd and PROM to 4th and 5th  8 reps  PROM for wrist flexion and extension done by OT  PROM to digits extension 2nd ,  3rd and 5th -and gentle on 4th   and use of LMB extension splint on 4th and 3rd -  at home     cannot use for any heavy gripping or squeezing  Only light activities and pain free  Rest hand in open hand -and work during day composite fist to touch palm  HEP at least 3-5 x day, Issued checklist for each day this week which patient has to check off each time he does the exercises and time performed, 5x a day optimally. Cont with same HEP for scar and digits              OT Treatments/Exercises (OP) - 09/01/19 0812      LUE Paraffin   Number Minutes Paraffin  8 Minutes    LUE Paraffin Location  Hand             OT Education - 09/01/19 1836    Education Details  reinforce composite fist PROM and place and hold , scar massage, schedule for exercises, ROM 4-5 times a day    Person(s) Educated  Patient    Methods   Explanation;Demonstration;Tactile cues;Verbal cues;Handout    Comprehension  Verbal cues required;Returned demonstration;Verbalized understanding       OT Short Term Goals - 07/14/19 2103      OT SHORT TERM GOAL #1   Title  Pt to be independent with HEP for splint wearing , PROM in splint ,edema control and incision healing    Baseline  no knowledge    Time  3    Period  Weeks    Status  New    Target Date  08/04/19        OT Long Term Goals - 07/14/19 2105      OT LONG TERM GOAL #1   Title  Pt show AROM in L hand to touch palm to use in ADl's    Baseline  Pt only PROM in splint for extention and flexion - 3 days s/p    Time  6    Period  Weeks    Status  New    Target Date  08/25/19      OT LONG TERM GOAL #2   Title  L wrist AROM increase to WNL to turn doorknob, and use in bathing    Baseline  3 days s/p - no AROM - PROM to digits in splint    Time  6    Period  Weeks    Status  New    Target Date  08/25/19      OT LONG TERM GOAL #3   Title  PRWHE function score improve with more than 20 points    Baseline  eval function at [facility] 50/50    Time  12    Period  Weeks    Status  New    Target Date  10/06/19            Plan - 09/01/19 0810    Clinical Impression Statement  Patient able to demo understanding of new extension splint, donning and doffing.  Wearing at night and at times during the day.  Patient continues to demonstrate stiffness in fingers for flexion with difficulty to perform composite fisting.  Continue to reinforce scar massage, composite flexion with place and hold and PROM.  Patient reports performing exercises at home but admits some days he only does them a couple times a day.  Issued daily checkout sheet for exercises 5 times a  day to promote patient engagement and accountability.  Continue towards goals to increase ROM, decrease pain and increase functional use of hand for necessary daily activities.    OT Occupational Profile and History   Problem Focused Assessment - Including review of records relating to presenting problem    Occupational performance deficits (Please refer to evaluation for details):  ADL's;IADL's;Work;Play;Leisure    Body Structure / Function / Physical Skills  ADL;Decreased knowledge of precautions;Flexibility;ROM;UE functional use;Scar mobility;Skin integrity;IADL;Pain;Strength;Edema;FMC    Rehab Potential  Good    Clinical Decision Making  Limited treatment options, no task modification necessary    Comorbidities Affecting Occupational Performance:  May have comorbidities impacting occupational performance    Modification or Assistance to Complete Evaluation   No modification of tasks or assist necessary to complete eval    OT Frequency  2x / week    OT Duration  --   3 weeks   OT Treatment/Interventions  Self-care/ADL training;Therapeutic exercise;Patient/family education;Splinting;Paraffin;Fluidtherapy;Contrast Bath;Manual Therapy;Passive range of motion;Scar mobilization    Consulted and Agree with Plan of Care  Patient       Patient will benefit from skilled therapeutic intervention in order to improve the following deficits and impairments:   Body Structure / Function / Physical Skills: ADL, Decreased knowledge of precautions, Flexibility, ROM, UE functional use, Scar mobility, Skin integrity, IADL, Pain, Strength, Edema, FMC       Visit Diagnosis: Muscle weakness (generalized)  Stiffness of left hand, not elsewhere classified  Pain in left hand  Localized edema    Problem List Patient Active Problem List   Diagnosis Date Noted  . CAD (coronary artery disease) 07/05/2018  . Diabetes (HCC) 07/05/2018  . Essential hypertension 07/05/2018  . History of stroke 07/05/2018  . Carotid stenosis 07/05/2018  . Confusion   . Altered mental status 08/08/2017   Chanta Bauers T Arne Cleveland, OTR/L, CLT  Anouk Critzer 09/01/2019, 6:58 PM  [facility] PHYS[facility] SPORTS  MEDICINE [address] [place], [address] Phone: [phone]   Fax:  [phone]  Name: Thomas Moore MRN: [id number] Date of Birth: 1956-02-16

## 2019-09-05 ENCOUNTER — Encounter: Payer: Self-pay | Admitting: Occupational Therapy

## 2019-09-05 ENCOUNTER — Ambulatory Visit: Payer: 59 | Attending: Orthopedic Surgery | Admitting: Occupational Therapy

## 2019-09-05 ENCOUNTER — Other Ambulatory Visit: Payer: Self-pay

## 2019-09-05 DIAGNOSIS — R600 Localized edema: Secondary | ICD-10-CM | POA: Insufficient documentation

## 2019-09-05 DIAGNOSIS — M6281 Muscle weakness (generalized): Secondary | ICD-10-CM | POA: Insufficient documentation

## 2019-09-05 DIAGNOSIS — M79642 Pain in left hand: Secondary | ICD-10-CM | POA: Diagnosis present

## 2019-09-05 DIAGNOSIS — M25642 Stiffness of left hand, not elsewhere classified: Secondary | ICD-10-CM | POA: Diagnosis present

## 2019-09-05 NOTE — Therapy (Signed)
[facility] [address], [place], [address] Phone: [phone]   Fax:  [phone]  Occupational Therapy Treatment  Patient Details  Name: RAUNEL DIMARTINO MRN: [id number] Date of Birth: [birth year] Referring Provider (OT): Cranston Neighbor   Encounter Date: 09/05/2019  OT End of Session - 09/05/19 0827    Visit Number  15    Number of Visits  24    Date for OT Re-Evaluation  10/06/19    OT Start Time  0815    OT Stop Time  0910    OT Time Calculation (min)  55 min    Activity Tolerance  Patient tolerated treatment well    Behavior During Therapy  [facility] for tasks assessed/performed       Past Medical History:  Diagnosis Date  . Coronary artery disease   . Diabetes mellitus without complication (HCC)   . GERD (gastroesophageal reflux disease)   . History of single vessel coronary artery bypass   . Hypertension     Past Surgical History:  Procedure Laterality Date  . CORNEAL TRANSPLANT Right   . CORONARY ARTERY BYPASS GRAFT  1997  . FLEXOR TENDON REPAIR Left 07/11/2019   Procedure: FLEXOR tenolysis  REPAIR LEFT RING FINGER with tednon repair;  Surgeon: Kennedy Bucker, MD;  Location: [facility];  Service: Orthopedics;  Laterality: Left;  . INCISION AND DRAINAGE ABSCESS Left 05/08/2019   Procedure: INCISION AND DRAINAGE ABSCESS;  Surgeon: Donato Heinz, MD;  Location: [facility] OR[facility];  Service: Orthopedics;  Laterality: Left;    There were no vitals filed for this visit.  Subjective Assessment - 09/05/19 0825    Subjective   Patient reports he used checkoff sheet and did his exercises every day, 5 times a day except for one day he did 4 times.  He reports still having difficulty with bending ring finger.  No pain at rest but some pain with ROM at home.    Pertinent History  History of bilateral CTS but symptoms decrease - pt had L 4th trigger finger that required release on 04/23/2019 by Dr Rosita Kea -but then pt develop  infection with I&D abscess  by Dr Ernest Pine 12/3 - had tendon repair 07/11/2019 after 4th FDP and FDS rupture    Patient Stated Goals  Want to get my hand better so I can do my job    Currently in Pain?  No/denies    Pain Score  0-No pain         Paraffin Bath for 8 mins with LMB extension splint on 3rd and 5th finger prior to manual therapy.  Manual therapy: Scar massage performed with manual techniques by therapist, use of  vibration on volar scar on 4th and palm.  Use of graston brushing with tool nr 2 on 2nd , 3rd and 5th with gentle PROM/AROM extensionand composite flexion to palm  Continue to Reinforce with pt to touch palm with PROM at home and then place and hold   Therapeutic Exercise:  Measurements taken this date for ROM refer to flowsheet for details.  Tendon glides AROM with place and hold last step for composite fist- and block PIP flexion PIP flexion hold. Opposition AROM to 2nd and 3rd and PROM to 4th and 5th, still unable to actively achieve to 4th and 5th  10 reps Patient with ring finger lag for flexion PIP of fingers improved, see flow sheet for details.   PROM for wrist flexion and extensiondone by OT  PROM to digits extension2nd , 3rd and 5th -and gentle on 4thPROM  Continue with  use of LMB extension splint on 4th and 3rd - at home   cannot use for any heavy gripping or squeezing  Only light activities and pain free  Continue to work on composite fisting, then rest with hand open position.   HEP at least 4-5 x day, Patient used checklist for each day this week with check off each time he does the exercises and time performed, 5x a day   [facility] OT Assessment - 09/05/19 0848      Assessment   Medical Diagnosis  L 4th trigger finger and I&D abscess    Referring Provider (OT)  Rachelle Hora    Onset Date/Surgical Date  05/08/19    Hand Dominance  Right      Left Hand AROM   L Index  MCP 0-90  90 Degrees   .35   L Index PIP 0-100  90  Degrees   0   L Long  MCP 0-90  85 Degrees   -35   L Long PIP 0-100  90 Degrees   -15   L Ring  MCP 0-90  85 Degrees   -32   L Ring PIP 0-100  55 Degrees   -25   L Little  MCP 0-90  90 Degrees   -35   L Little PIP 0-100  55 Degrees   -10              OT Treatments/Exercises (OP) - 09/05/19 1112      LUE Paraffin   Number Minutes Paraffin  8 Minutes    LUE Paraffin Location  Hand    Comments  LMB splint on 4th this date with  3 lbs on hand with heatingpad for slow extention stretch on L hand              OT Education - 09/05/19 1108    Education Details  reinforce composite fist PROM and place and hold , scar massage, schedule for exercises, ROM 4-5 times a day    Person(s) Educated  Patient    Methods  Explanation;Demonstration;Tactile cues;Verbal cues;Handout    Comprehension  Verbal cues required;Returned demonstration;Verbalized understanding       OT Short Term Goals - 07/14/19 2103      OT SHORT TERM GOAL #1   Title  Pt to be independent with HEP for splint wearing , PROM in splint ,edema control and incision healing    Baseline  no knowledge    Time  3    Period  Weeks    Status  New    Target Date  08/04/19        OT Long Term Goals - 07/14/19 2105      OT LONG TERM GOAL #1   Title  Pt show AROM in L hand to touch palm to use in ADl's    Baseline  Pt only PROM in splint for extention and flexion - 3 days s/p    Time  6    Period  Weeks    Status  New    Target Date  08/25/19      OT LONG TERM GOAL #2   Title  L wrist AROM increase to WNL to turn doorknob, and use in bathing    Baseline  3 days s/p - no AROM - PROM to digits in splint    Time  6    Period  Weeks    Status  New    Target Date  08/25/19      OT LONG TERM GOAL #3   Title  PRWHE function score improve with more than 20 points    Baseline  eval function at [facility] 50/50    Time  12    Period  Weeks    Status  New    Target Date  10/06/19            Plan -  09/05/19 0827    Clinical Impression Statement  Patient continuing to work on ROM and scar massage at home, he did show increased compliance this week with use of grid to fill out for each day with the anticipation of performing exercises 5 times a day.  3 days he performed 5 times and one day 4 times.  He demonstrates improved PIP extension, but lacks full MP extension.  Flexion slowly improving but varies day to day, decreased active movement in RF for flexion especially at PIP.  Issued a new compliance chart for the next week, he reports Saturday he has plans and may not be able to complete all 5 times but will try a min of 3.  Continue OT towards goals in plan of care to increase independence and function in hand.    OT Occupational Profile and History  Problem Focused Assessment - Including review of records relating to presenting problem    Occupational performance deficits (Please refer to evaluation for details):  ADL's;IADL's;Work;Play;Leisure    Body Structure / Function / Physical Skills  ADL;Decreased knowledge of precautions;Flexibility;ROM;UE functional use;Scar mobility;Skin integrity;IADL;Pain;Strength;Edema;FMC    Rehab Potential  Good    Clinical Decision Making  Limited treatment options, no task modification necessary    Comorbidities Affecting Occupational Performance:  May have comorbidities impacting occupational performance    Modification or Assistance to Complete Evaluation   No modification of tasks or assist necessary to complete eval    OT Frequency  2x / week    OT Duration  --   3 weeks   OT Treatment/Interventions  Self-care/ADL training;Therapeutic exercise;Patient/family education;Splinting;Paraffin;Fluidtherapy;Contrast Bath;Manual Therapy;Passive range of motion;Scar mobilization    Consulted and Agree with Plan of Care  Patient       Patient will benefit from skilled therapeutic intervention in order to improve the following deficits and impairments:   Body  Structure / Function / Physical Skills: ADL, Decreased knowledge of precautions, Flexibility, ROM, UE functional use, Scar mobility, Skin integrity, IADL, Pain, Strength, Edema, FMC       Visit Diagnosis: Muscle weakness (generalized)  Stiffness of left hand, not elsewhere classified  Pain in left hand  Localized edema    Problem List Patient Active Problem List   Diagnosis Date Noted  . CAD (coronary artery disease) 07/05/2018  . Diabetes (HCC) 07/05/2018  . Essential hypertension 07/05/2018  . History of stroke 07/05/2018  . Carotid stenosis 07/05/2018  . Confusion   . Altered mental status 08/08/2017   Alyha Marines T Epifanio Labrador, OTR/L, CLT  Calistro Rauf 09/05/2019, 11:18 AM  [facility] PHYSICAL AND SPORTS MEDICINE [address], [place], [address] Phone: [phone]   Fax:  [phone]  Name: JUJUAN DUGO MRN: [id number] Date of Birth: 02/08/1956

## 2019-09-09 ENCOUNTER — Ambulatory Visit: Payer: 59 | Admitting: Occupational Therapy

## 2019-09-09 ENCOUNTER — Other Ambulatory Visit: Payer: Self-pay

## 2019-09-09 DIAGNOSIS — M6281 Muscle weakness (generalized): Secondary | ICD-10-CM | POA: Diagnosis not present

## 2019-09-09 DIAGNOSIS — M79642 Pain in left hand: Secondary | ICD-10-CM

## 2019-09-09 DIAGNOSIS — R600 Localized edema: Secondary | ICD-10-CM

## 2019-09-09 DIAGNOSIS — M25642 Stiffness of left hand, not elsewhere classified: Secondary | ICD-10-CM

## 2019-09-09 NOTE — Therapy (Signed)
[place] [facility] [address] [place], [address] Phone: [phone]   Fax:  [phone]  Occupational Therapy Treatment  Patient Details  Name: Thomas Moore MRN: [id number] Date of Birth: 1956-05-24 Referring Provider (OT): Cranston Neighbor   Encounter Date: 09/09/2019  OT End of Session - 09/09/19 0831    Visit Number  16    Number of Visits  24    Date for OT Re-Evaluation  10/06/19    OT Start Time  0817    OT Stop Time  0900    OT Time Calculation (min)  43 min    Activity Tolerance  Patient tolerated treatment well    Behavior During Therapy  [facility] for tasks assessed/performed       Past Medical History:  Diagnosis Date  . Coronary artery disease   . Diabetes mellitus without complication (HCC)   . GERD (gastroesophageal reflux disease)   . History of single vessel coronary artery bypass   . Hypertension     Past Surgical History:  Procedure Laterality Date  . CORNEAL TRANSPLANT Right   . CORONARY ARTERY BYPASS GRAFT  1997  . FLEXOR TENDON REPAIR Left 07/11/2019   Procedure: FLEXOR tenolysis  REPAIR LEFT RING FINGER with tednon repair;  Surgeon: Kennedy Bucker, MD;  Location: [facility] ORS;  Service: Orthopedics;  Laterality: Left;  . INCISION AND DRAINAGE ABSCESS Left 05/08/2019   Procedure: INCISION AND DRAINAGE ABSCESS;  Surgeon: Donato Heinz, MD;  Location: [facility] ORS;  Service: Orthopedics;  Laterality: Left;    There were no vitals filed for this visit.  Subjective Assessment - 09/09/19 0826    Subjective   She made last week a check off list for me - so I can do my exercises - not using it really - not for bathing or dressing - splint on at night time and some during day    Pertinent History  History of bilateral CTS but symptoms decrease - pt had L 4th trigger finger that required release on 04/23/2019 by Dr Rosita Kea -but then pt develop infection with I&D abscess  by Dr Ernest Pine 12/3 - had tendon repair  07/11/2019 after 4th FDP and FDS rupture    Patient Stated Goals  Want to get my hand better so I can do my job    Currently in Pain?  No/denies      Pt arrive having extention splint on since night time - pt stiff into flexion  Pt extention appear to had improve this past week with having schedule that he need to check off for HEP - 4-5 x day But flexion stiff this am because of splint not taken off -  Told pt to not wear splint at all during day , only night time for extention stretch  Modify to increase extention at PIP's and MC's of 3rd thru 5th   Scar still thick and adhere at 4th digit and stiffness              OT Treatments/Exercises (OP) - 09/09/19 0001      LUE Contrast Bath   Time  8 minutes    Comments  intrinsic stretch for L hand prior to ROM and soft tissue      Pt kept during heat in intrinsic fist to increase PIP flexion at digits    Manual therapy: Scar massage performed with manual techniques by therapist, use of vibration on volar scar on 4th and palm. Use of  graston brushing with tool nr 2 on 2nd , 3rd and 5th with  PROM/AROM extensionand composite flexion to palm  Continue to Reinforce with pt to touch palm with PROM at home and then place and hold   Therapeutic Exercise:  PROM to all digits flexion done - to touch palm - attempted AROM intrinsic fist - but only 55 degrees of PIP flexion during composite and intrinsic fist  No DIP flexion   Tendon glides AROM with place and hold last step for composite fist- and block PIP flexion PIP flexion hold. Opposition AROM to 2nd and 3rd and PROM to 4th and 5th, still unable to actively achieve to 4th and 5th  10 reps Patient with ring finger lag for flexion - PIP at 55   Splint adjusted this date see note above - to wear night time  Will address wrist ROM next session - plan to use CPM on BTE     PROM to digits extension2nd , 3rd and 5th -and gentle on 4thPROM  Continue with  use of  LMB extension splint on 4th and 3rd - at home   cannot use for any heavy gripping or squeezing  Only light activities and pain free - and need to bring list of 5 things he used hand in Continue to work on composite fisting, then rest with hand open position.   HEP at least 4-5 x day, Patient used checklist for each day this past week with check off each time he does the exercises and time performed, 5x a day        OT Education - 09/09/19 0831    Education Details  splint changes and intrinsic stretch    Person(s) Educated  Patient    Methods  Explanation;Demonstration;Tactile cues;Verbal cues;Handout    Comprehension  Verbal cues required;Returned demonstration;Verbalized understanding       OT Short Term Goals - 07/14/19 2103      OT SHORT TERM GOAL #1   Title  Pt to be independent with HEP for splint wearing , PROM in splint ,edema control and incision healing    Baseline  no knowledge    Time  3    Period  Weeks    Status  New    Target Date  08/04/19        OT Long Term Goals - 07/14/19 2105      OT LONG TERM GOAL #1   Title  Pt show AROM in L hand to touch palm to use in ADl's    Baseline  Pt only PROM in splint for extention and flexion - 3 days s/p    Time  6    Period  Weeks    Status  New    Target Date  08/25/19      OT LONG TERM GOAL #2   Title  L wrist AROM increase to WNL to turn doorknob, and use in bathing    Baseline  3 days s/p - no AROM - PROM to digits in splint    Time  6    Period  Weeks    Status  New    Target Date  08/25/19      OT LONG TERM GOAL #3   Title  PRWHE function score improve with more than 20 points    Baseline  eval function at PRWHE 50/50    Time  12    Period  Weeks    Status  New    Target Date  10/06/19            Plan - 09/09/19 0831    Clinical Impression Statement  Pt is 8 1/.2 wks s/p tendon repair of 4th digit on L hand - after 2 trigger finger surgeries - pt showing improvement in extention of  digist - but scar tissue still thick and adhere at 4th volar digits and decrease PIP and DIP flexion- 55 degrees of flexion at PIP - pt to focus on intrinsicfist and no splint on during day - encourange functional use of hand    OT Occupational Profile and History  Problem Focused Assessment - Including review of records relating to presenting problem    Occupational performance deficits (Please refer to evaluation for details):  ADL's;IADL's;Work;Play;Leisure    Body Structure / Function / Physical Skills  ADL;Decreased knowledge of precautions;Flexibility;ROM;UE functional use;Scar mobility;Skin integrity;IADL;Pain;Strength;Edema;FMC    Rehab Potential  Good    Clinical Decision Making  Limited treatment options, no task modification necessary    Comorbidities Affecting Occupational Performance:  May have comorbidities impacting occupational performance    Modification or Assistance to Complete Evaluation   No modification of tasks or assist necessary to complete eval    OT Frequency  2x / week    OT Duration  2 weeks    OT Treatment/Interventions  Self-care/ADL training;Therapeutic exercise;Patient/family education;Splinting;Paraffin;Fluidtherapy;Contrast Bath;Manual Therapy;Passive range of motion;Scar mobilization    Plan  HEP changes,assess progress and scar massage and tolerance to new splint    OT Home Exercise Plan  see pt instruction    Consulted and Agree with Plan of Care  Patient       Patient will benefit from skilled therapeutic intervention in order to improve the following deficits and impairments:   Body Structure / Function / Physical Skills: ADL, Decreased knowledge of precautions, Flexibility, ROM, UE functional use, Scar mobility, Skin integrity, IADL, Pain, Strength, Edema, FMC       Visit Diagnosis: Muscle weakness (generalized)  Stiffness of left hand, not elsewhere classified  Pain in left hand  Localized edema    Problem List Patient Active Problem List    Diagnosis Date Noted  . CAD (coronary artery disease) 07/05/2018  . Diabetes (HCC) 07/05/2018  . Essential hypertension 07/05/2018  . History of stroke 07/05/2018  . Carotid stenosis 07/05/2018  . Confusion   . Altered mental status 08/08/2017    Oletta Cohn OTR/L,CLT 09/09/2019, 2:47 PM  [place] [facility]TS [facility]ICINE [address] [place], [address] Phone: [phone]   Fax:  [phone]  Name: Thomas Moore MRN: [id number] Date of Birth: [birth year]

## 2019-09-09 NOTE — Patient Instructions (Signed)
Pt to only wear digits extention splint night time  Do HEP 4-5 x day And add intrinsic stretch in heat - at start of HEP

## 2019-09-10 ENCOUNTER — Ambulatory Visit: Payer: 59 | Admitting: Occupational Therapy

## 2019-09-10 DIAGNOSIS — M6281 Muscle weakness (generalized): Secondary | ICD-10-CM | POA: Diagnosis not present

## 2019-09-10 DIAGNOSIS — M79642 Pain in left hand: Secondary | ICD-10-CM

## 2019-09-10 DIAGNOSIS — M25642 Stiffness of left hand, not elsewhere classified: Secondary | ICD-10-CM

## 2019-09-10 DIAGNOSIS — R600 Localized edema: Secondary | ICD-10-CM

## 2019-09-10 NOTE — Patient Instructions (Signed)
See note

## 2019-09-10 NOTE — Therapy (Signed)
[place] [facility] SPORTS [facility] [address] [place], [address] Phone: [phone]   Fax:  [phone]  Occupational Therapy Treatment  Patient Details  Name: Thomas Moore MRN: [id number] Date of Birth: 12-27-1955 Referring Provider (OT): Rachelle Hora   Encounter Date: 09/10/2019  OT End of Session - 09/10/19 1534    Visit Number  17    Number of Visits  24    Date for OT Re-Evaluation  10/06/19    OT Start Time  1445    OT Stop Time  1531    OT Time Calculation (min)  46 min    Activity Tolerance  Patient tolerated treatment well    Behavior During Therapy  [facility] for tasks assessed/performed       Past Medical History:  Diagnosis Date  . Coronary artery disease   . Diabetes mellitus without complication ([place])   . GERD (gastroesophageal reflux disease)   . History of single vessel coronary artery bypass   . Hypertension     Past Surgical History:  Procedure Laterality Date  . CORNEAL TRANSPLANT Right   . CORONARY ARTERY BYPASS GRAFT  1997  . FLEXOR TENDON REPAIR Left 07/11/2019   Procedure: FLEXOR tenolysis  REPAIR LEFT RING FINGER with tednon repair;  Surgeon: Hessie Knows, MD;  Location: [facility];  Service: Orthopedics;  Laterality: Left;  . INCISION AND DRAINAGE ABSCESS Left 05/08/2019   Procedure: INCISION AND DRAINAGE ABSCESS;  Surgeon: Dereck Leep, MD;  Location: [facility] OR[facility];  Service: Orthopedics;  Laterality: Left;    There were no vitals filed for this visit.  Subjective Assessment - 09/10/19 1533    Subjective   I did use my hand with bathing today, in kitchen to clean up and taking trash to dumpster - did sleep with brace - but did not wear my glove for while    Pertinent History  History of bilateral CTS but symptoms decrease - pt had L 4th trigger finger that required release on 04/23/2019 by Dr Rudene Christians -but then pt develop infection with I&D abscess  by Dr Marry Guan 12/3 - had tendon repair 07/11/2019 after 4th  FDP and FDS rupture    Patient Stated Goals  Want to get my hand better so I can do my job    Currently in Pain?  No/denies         [facility] OT Assessment - 09/10/19 0001      AROM   Left Wrist Extension  42 Degrees    Left Wrist Flexion  65 Degrees      Left Hand AROM   L Index  MCP 0-90  90 Degrees    L Index PIP 0-100  90 Degrees    L Long  MCP 0-90  85 Degrees    L Long PIP 0-100  80 Degrees    L Ring  MCP 0-90  85 Degrees    L Ring PIP 0-100  60 Degrees    L Little  MCP 0-90  90 Degrees    L Little PIP 0-100  90 Degrees          Told pt to not wear splint at all during day , only night time for extention stretch  Use hand functionally during day Modify splint yesterday to increase extention at PIP's and MC's of 3rd thru 5th -  Scar still thick and adhere at 4th digit and stiffness      OT Treatments/Exercises (OP) - 09/10/19 0001  LUE Contrast Bath   Time  8 minutes    Comments  intrinsic fist , composite fist stretch alternate        Pt kept during heat in intrinsic fist and full composite fist in ice to increase PIP flexion at digits   Manual therapy: Scar massage performed with manual techniques by therapist, use of vibration on volar scar on 4th and palm. Use of graston brushing with tool nr 2 on 2nd , 3rd and 5th with  PROM/AROM extensionand composite flexion to palm  Continue to Reinforce with pt to touch palm with PROM at home and then place and hold   Therapeutic Exercise: PROM to each digit touching palm - 10 reps PROM and AAROM intrinsic fist - facilitate and done 20 reps   Composite PROM to touch palm 20 reps and PROM digits extention 20reps  Place and hold composite fist to palm - 60 at 4th PIP  And see flowsheet  Opposition AROM to 2nd and 3rd and PROM to 4th and 5th,still unable to actively achieve to 4th and 5th  10reps Add this date to HEP and done  PROM /AAROM over edge of table wrist extention , flexion , RD, UD  10  reps  Prayer stretch for wrist extention 10 reps   Still cannot use for any heavy gripping or squeezing  Only light activities and pain free- and need to bring list of 5 things he used hand in Continue to work on composite fisting, then rest with hand open position.  HEP at least4-5x day, Patient usedchecklist for each day this past week withcheck off each time he does the exercises and time performed, 5x a day         OT Education - 09/10/19 1534    Education Details  HEP updated    Person(s) Educated  Patient    Methods  Explanation;Demonstration;Tactile cues;Verbal cues;Handout    Comprehension  Verbal cues required;Returned demonstration;Verbalized understanding       OT Short Term Goals - 07/14/19 2103      OT SHORT TERM GOAL #1   Title  Pt to be independent with HEP for splint wearing , PROM in splint ,edema control and incision healing    Baseline  no knowledge    Time  3    Period  Weeks    Status  New    Target Date  08/04/19        OT Long Term Goals - 07/14/19 2105      OT LONG TERM GOAL #1   Title  Pt show AROM in L hand to touch palm to use in ADl's    Baseline  Pt only PROM in splint for extention and flexion - 3 days s/p    Time  6    Period  Weeks    Status  New    Target Date  08/25/19      OT LONG TERM GOAL #2   Title  L wrist AROM increase to WNL to turn doorknob, and use in bathing    Baseline  3 days s/p - no AROM - PROM to digits in splint    Time  6    Period  Weeks    Status  New    Target Date  08/25/19      OT LONG TERM GOAL #3   Title  PRWHE function score improve with more than 20 points    Baseline  eval function at [facility] 50/50  Time  12    Period  Weeks    Status  New    Target Date  10/06/19            Plan - 09/10/19 1535    Clinical Impression Statement  Pt is 8 1/2 wks s/p tendon repair of 4th digit L hand - pt show slow but steady progress with extention of digits and flexion of digits - edema in 4th  and 3rd digit -and scar tissue adhesions- remind pt to wear compression glove night time under etention splint -and no splint during day - HEP 5 x day - using schedule - and use hand funcitonally    OT Occupational Profile and History  Problem Focused Assessment - Including review of records relating to presenting problem    Occupational performance deficits (Please refer to evaluation for details):  ADL's;IADL's;Work;Play;Leisure    Body Structure / Function / Physical Skills  ADL;Decreased knowledge of precautions;Flexibility;ROM;UE functional use;Scar mobility;Skin integrity;IADL;Pain;Strength;Edema;FMC    Rehab Potential  Good    Clinical Decision Making  Limited treatment options, no task modification necessary    Comorbidities Affecting Occupational Performance:  May have comorbidities impacting occupational performance    Modification or Assistance to Complete Evaluation   No modification of tasks or assist necessary to complete eval    OT Frequency  2x / week    OT Duration  2 weeks    OT Treatment/Interventions  Self-care/ADL training;Therapeutic exercise;Patient/family education;Splinting;Paraffin;Fluidtherapy;Contrast Bath;Manual Therapy;Passive range of motion;Scar mobilization    Plan  HEP changes,assess progress and scar massage and tolerance to new splint    OT Home Exercise Plan  see pt instruction    Consulted and Agree with Plan of Care  Patient       Patient will benefit from skilled therapeutic intervention in order to improve the following deficits and impairments:   Body Structure / Function / Physical Skills: ADL, Decreased knowledge of precautions, Flexibility, ROM, UE functional use, Scar mobility, Skin integrity, IADL, Pain, Strength, Edema, FMC       Visit Diagnosis: Muscle weakness (generalized)  Stiffness of left hand, not elsewhere classified  Pain in left hand  Localized edema    Problem List Patient Active Problem List   Diagnosis Date Noted  . CAD  (coronary artery disease) 07/05/2018  . Diabetes (HCC) 07/05/2018  . Essential hypertension 07/05/2018  . History of stroke 07/05/2018  . Carotid stenosis 07/05/2018  . Confusion   . Altered mental status 08/08/2017    Oletta Cohn OTR/L,CLT 09/10/2019, 3:38 PM  [place] [facility] SPORTS MEDICINE [address] [place], [address] Phone: [phone]   Fax:  [phone]  Name: CHILTON SALLADE MRN: [id number] Date of Birth: 1956-06-04

## 2019-09-11 ENCOUNTER — Other Ambulatory Visit: Payer: Self-pay

## 2019-09-11 ENCOUNTER — Ambulatory Visit: Payer: 59 | Admitting: Occupational Therapy

## 2019-09-11 DIAGNOSIS — R600 Localized edema: Secondary | ICD-10-CM

## 2019-09-11 DIAGNOSIS — M6281 Muscle weakness (generalized): Secondary | ICD-10-CM

## 2019-09-11 DIAGNOSIS — M79642 Pain in left hand: Secondary | ICD-10-CM

## 2019-09-11 DIAGNOSIS — M25642 Stiffness of left hand, not elsewhere classified: Secondary | ICD-10-CM

## 2019-09-11 NOTE — Therapy (Signed)
[facility] [address], [place], [address] Phone: [phone]   Fax:  [phone]  Occupational Therapy Treatment  Patient Details  Name: Thomas Moore MRN: [id number] Date of Birth: 1956-04-06 Referring Provider (OT): Cranston Neighbor   Encounter Date: 09/11/2019  OT End of Session - 09/11/19 0829    Visit Number  18    Number of Visits  24    Date for OT Re-Evaluation  10/06/19    OT Start Time  0815    OT Stop Time  0900    OT Time Calculation (min)  45 min    Activity Tolerance  Patient tolerated treatment well    Behavior During Therapy  [facility] for tasks assessed/performed       Past Medical History:  Diagnosis Date  . Coronary artery disease   . Diabetes mellitus without complication (HCC)   . GERD (gastroesophageal reflux disease)   . History of single vessel coronary artery bypass   . Hypertension     Past Surgical History:  Procedure Laterality Date  . CORNEAL TRANSPLANT Right   . CORONARY ARTERY BYPASS GRAFT  1997  . FLEXOR TENDON REPAIR Left 07/11/2019   Procedure: FLEXOR tenolysis  REPAIR LEFT RING FINGER with tednon repair;  Surgeon: Kennedy Bucker, MD;  Location: [facility] OR[facility];  Service: Orthopedics;  Laterality: Left;  . INCISION AND DRAINAGE ABSCESS Left 05/08/2019   Procedure: INCISION AND DRAINAGE ABSCESS;  Surgeon: Donato Heinz, MD;  Location: [facility] OR[facility];  Service: Orthopedics;  Laterality: Left;    There were no vitals filed for this visit.  Subjective Assessment - 09/11/19 0822    Subjective   I brought my splint to show you my fingers are at the tips not all the way back in the splint - did my exercises one time yesterday after I left here -but not today yet    Pertinent History  History of bilateral CTS but symptoms decrease - pt had L 4th trigger finger that required release on 04/23/2019 by Dr Rosita Kea -but then pt develop infection with I&D abscess  by Dr Ernest Pine 12/3 - had tendon repair  07/11/2019 after 4th FDP and FDS rupture    Patient Stated Goals  Want to get my hand better so I can do my job    Currently in Pain?  No/denies            Told pt to not wear splint at all during day , only night time for extention stretch  Use hand functionally during day - pt could carry 3 lbs, open jar, turn doorknob  Modify splint 2 days ago  to increase extention at PIP's and MC's of 3rd thru 5th -pt ed this date on correctly applying strap to keep 4th and 3rd at night in extention at PIP and DIP   Scar still thick and adhere at 4th digit and stiffness        OT Treatments/Exercises (OP) - 09/11/19 0001      LUE Contrast Bath   Time  8 minutes    Comments  intrinsic fist and composite alternate        Pt kept during heat in intrinsic fist and full composite fist in ice to increase PIP flexion at digits Manual therapy: Scar massage performed with manual techniques by therapist, use of vibration on volar scar on 4th and palm. Use of graston brushing with tool nr 2 on 2nd , 3rd and 5th with  PROM/AROM extensionand composite flexion to palm  Continue to Reinforce with pt to touch palm with PROM at home and then place and hold   Therapeutic Exercise: PROM to each digit touching palm - 10 reps PROM and AAROM intrinsic fist - facilitate and done 20 reps   Composite PROM to touch palm 20 reps and PROM digits extention 20reps  Place and hold composite fist to palm - 60 at 4th PIP again today at end of session    Opposition AROM to 2nd and 3rd and PROM to 4th and 5th,still unable to actively achieve to 4th and 5th  10reps Add yesterday  to HEP and done  PROM /AAROM over edge of table wrist extention , flexion , RD, UD  10 reps  Prayer stretch for wrist extention 10 reps needed max A to do correctly   Still cannot use for any heavy gripping or squeezing  Only light activities and pain free- and need to bring list of 5 things he used hand in Continue to  work on composite fisting, then rest with hand open position.  HEP at least4-5x day, Patient usedchecklist for each day thispastweek withcheck off each time he does the exercises and time performed, 5x a day         OT Education - 09/11/19 0829    Education Details  review HEP    Person(s) Educated  Patient    Methods  Explanation;Demonstration;Tactile cues;Verbal cues;Handout    Comprehension  Verbal cues required;Returned demonstration;Verbalized understanding       OT Short Term Goals - 07/14/19 2103      OT SHORT TERM GOAL #1   Title  Pt to be independent with HEP for splint wearing , PROM in splint ,edema control and incision healing    Baseline  no knowledge    Time  3    Period  Weeks    Status  New    Target Date  08/04/19        OT Long Term Goals - 07/14/19 2105      OT LONG TERM GOAL #1   Title  Pt show AROM in L hand to touch palm to use in ADl's    Baseline  Pt only PROM in splint for extention and flexion - 3 days s/p    Time  6    Period  Weeks    Status  New    Target Date  08/25/19      OT LONG TERM GOAL #2   Title  L wrist AROM increase to WNL to turn doorknob, and use in bathing    Baseline  3 days s/p - no AROM - PROM to digits in splint    Time  6    Period  Weeks    Status  New    Target Date  08/25/19      OT LONG TERM GOAL #3   Title  PRWHE function score improve with more than 20 points    Baseline  eval function at PRWHE 50/50    Time  12    Period  Weeks    Status  New    Target Date  10/06/19            Plan - 09/11/19 0830    Clinical Impression Statement  Pt is about 9 wks s/p tendon repair of 4th digit L hand - pt show increase wrist AROM and slow but steady progress in digits flexion/extention and scar tissue still thick  and limiting motion in 4th -    OT Occupational Profile and History  Problem Focused Assessment - Including review of records relating to presenting problem    Occupational performance  deficits (Please refer to evaluation for details):  ADL's;IADL's;Work;Play;Leisure    Body Structure / Function / Physical Skills  ADL;Decreased knowledge of precautions;Flexibility;ROM;UE functional use;Scar mobility;Skin integrity;IADL;Pain;Strength;Edema;FMC    Rehab Potential  Good    Clinical Decision Making  Limited treatment options, no task modification necessary    Comorbidities Affecting Occupational Performance:  May have comorbidities impacting occupational performance    Modification or Assistance to Complete Evaluation   No modification of tasks or assist necessary to complete eval    OT Frequency  2x / week    OT Duration  2 weeks    OT Treatment/Interventions  Self-care/ADL training;Therapeutic exercise;Patient/family education;Splinting;Paraffin;Fluidtherapy;Contrast Bath;Manual Therapy;Passive range of motion;Scar mobilization    Plan  HEP changes,assess progress and scar massage and tolerance to new splint    OT Home Exercise Plan  see pt instruction    Consulted and Agree with Plan of Care  Patient       Patient will benefit from skilled therapeutic intervention in order to improve the following deficits and impairments:   Body Structure / Function / Physical Skills: ADL, Decreased knowledge of precautions, Flexibility, ROM, UE functional use, Scar mobility, Skin integrity, IADL, Pain, Strength, Edema, FMC       Visit Diagnosis: Muscle weakness (generalized)  Stiffness of left hand, not elsewhere classified  Pain in left hand  Localized edema    Problem List Patient Active Problem List   Diagnosis Date Noted  . CAD (coronary artery disease) 07/05/2018  . Diabetes (HCC) 07/05/2018  . Essential hypertension 07/05/2018  . History of stroke 07/05/2018  . Carotid stenosis 07/05/2018  . Confusion   . Altered mental status 08/08/2017    Oletta Cohn OTR/l,CLT 09/11/2019, 9:59 AM  [facility] PHYSICAL AND SPORTS  MEDICINE [address], [place], [address] Phone: [phone]   Fax:  [phone]  Name: LOCHLANN MASTRANGELO MRN: [id number] Date of Birth: 09/10/1955

## 2019-09-11 NOTE — Patient Instructions (Signed)
Same

## 2019-09-15 ENCOUNTER — Ambulatory Visit: Payer: 59 | Admitting: Occupational Therapy

## 2019-09-15 ENCOUNTER — Other Ambulatory Visit: Payer: Self-pay

## 2019-09-15 DIAGNOSIS — M6281 Muscle weakness (generalized): Secondary | ICD-10-CM | POA: Diagnosis not present

## 2019-09-15 DIAGNOSIS — M79642 Pain in left hand: Secondary | ICD-10-CM

## 2019-09-15 DIAGNOSIS — R600 Localized edema: Secondary | ICD-10-CM

## 2019-09-15 DIAGNOSIS — M25642 Stiffness of left hand, not elsewhere classified: Secondary | ICD-10-CM

## 2019-09-15 NOTE — Patient Instructions (Signed)
Alternate session to focus on extention of digits in heat with LMB PIP extention splint  And then intrinsic fist And after doing fisting place and hold - to do teal med putty 12 reps - place and hold 3 sec

## 2019-09-15 NOTE — Therapy (Signed)
[facility]IC[facility] [address], [place], [address] Phone: [phone]   Fax:  [phone]  Occupational Therapy Treatment  Patient Details  Name: Thomas Moore MRN: [id number] Date of Birth: 03/08/1956 Referring Provider (OT): Cranston Neighbor   Encounter Date: 09/15/2019  OT End of Session - 09/15/19 0823    Visit Number  19    Number of Visits  24    Date for OT Re-Evaluation  10/06/19    OT Start Time  0810    OT Stop Time  0858    OT Time Calculation (min)  48 min    Activity Tolerance  Patient tolerated treatment well    Behavior During Therapy  [facility] for tasks assessed/performed       Past Medical History:  Diagnosis Date  . Coronary artery disease   . Diabetes mellitus without complication (HCC)   . GERD (gastroesophageal reflux disease)   . History of single vessel coronary artery bypass   . Hypertension     Past Surgical History:  Procedure Laterality Date  . CORNEAL TRANSPLANT Right   . CORONARY ARTERY BYPASS GRAFT  1997  . FLEXOR TENDON REPAIR Left 07/11/2019   Procedure: FLEXOR tenolysis  REPAIR LEFT RING FINGER with tednon repair;  Surgeon: Kennedy Bucker, MD;  Location: [facility];  Service: Orthopedics;  Laterality: Left;  . INCISION AND DRAINAGE ABSCESS Left 05/08/2019   Procedure: INCISION AND DRAINAGE ABSCESS;  Surgeon: Donato Heinz, MD;  Location: [facility];  Service: Orthopedics;  Laterality: Left;    There were no vitals filed for this visit.  Subjective Assessment - 09/15/19 0821    Subjective   I try and use it - I cannot think of anything specifically - I did make me a table to check off - to do my exercises like last week 5 x day    Pertinent History  History of bilateral CTS but symptoms decrease - pt had L 4th trigger finger that required release on 04/23/2019 by Dr Rosita Kea -but then pt develop infection with I&D abscess  by Dr Ernest Pine 12/3 - had tendon repair 07/11/2019 after 4th FDP and FDS  rupture    Patient Stated Goals  Want to get my hand better so I can do my job    Currently in Pain?  No/denies         [facility] OT Assessment - 09/15/19 0001      Strength   Right Hand Grip (lbs)  55    Right Hand Lateral Pinch  15 lbs    Right Hand 3 Point Pinch  14 lbs    Left Hand Lateral Pinch  10 lbs    Left Hand 3 Point Pinch  10 lbs      Left Hand AROM   L Index  MCP 0-90  85 Degrees   -25   L Index PIP 0-100  85 Degrees   0   L Long  MCP 0-90  90 Degrees   -35   L Long PIP 0-100  80 Degrees   -15   L Ring  MCP 0-90  90 Degrees   -30   L Ring PIP 0-100  55 Degrees   -20   L Little  MCP 0-90  90 Degrees   -30   L Little PIP 0-100  75 Degrees   -15       Pt making slow but steady progress in extention of digits - and  in session flexion of digits except 4th PIP  Pt able to touch palm in session with 2nd , 3rd and 5th - but PIP flexion of 4th stay between 55 to 60          OT Treatments/Exercises (OP) - 09/15/19 0001      LUE Contrast Bath   Time  8 minutes    Comments  intrinsic fist stretch during heat to increase flexion        Pt kept during heat in intrinsic fist to  increase flexion at digits Manual therapy: Scar massage performed with manual techniques by therapist, use of vibration on volar scar on 4th and palm. Use of graston brushing with tool nr 2 on 2nd , 3rd and 5th with PROM/AROM extensionand composite flexion to palm  Continue to Reinforce with pt to touch palm with PROM at home and then place and hold   Therapeutic Exercise: PROM to each digit touching palm - 10 reps PROMand AAROMintrinsic fist- facilitate and done 20 reps  Composite PROM to touch palm 20 reps and PROM digits extention 20reps Place and hold composite fist to palm - 60 at 4th PIP again today at end of session  Add this date gripping of teal putty - can do place and hold 3 sec with 4th digit  12 reps -add to HEP    Opposition AROM to 2nd and 3rd  and PROM to 4th and 5th,still unable to actively achieve to 4th and 5th  10reps  PROM /AAROM over edge of table wrist extention , flexion , RD, UD  10 reps  Prayer stretch for wrist extention 10 reps needed max A to do correctly   Still cannot use for any heavy gripping or squeezing and pushing up thru hand Can use in light activities- and need to bring list of 5 things he used hand in - has hard time bring or naming things he can do or not do with hand  Pt had no issues this date putting on glove or reaching in pocket with L hand   HEP at least4-5x day, Patient usedchecklist for each day thispastweek withcheck off each time he does the exercises and time performed, 5x a day         OT Education - 09/15/19 0823    Education Details  review HEP    Person(s) Educated  Patient    Methods  Explanation;Demonstration;Tactile cues;Verbal cues;Handout    Comprehension  Verbal cues required;Returned demonstration;Verbalized understanding       OT Short Term Goals - 07/14/19 2103      OT SHORT TERM GOAL #1   Title  Pt to be independent with HEP for splint wearing , PROM in splint ,edema control and incision healing    Baseline  no knowledge    Time  3    Period  Weeks    Status  New    Target Date  08/04/19        OT Long Term Goals - 07/14/19 2105      OT LONG TERM GOAL #1   Title  Pt show AROM in L hand to touch palm to use in ADl's    Baseline  Pt only PROM in splint for extention and flexion - 3 days s/p    Time  6    Period  Weeks    Status  New    Target Date  08/25/19      OT LONG TERM GOAL #2   Title  L wrist AROM increase to WNL to turn doorknob, and use in bathing    Baseline  3 days s/p - no AROM - PROM to digits in splint    Time  6    Period  Weeks    Status  New    Target Date  08/25/19      OT LONG TERM GOAL #3   Title  PRWHE function score improve with more than 20 points    Baseline  eval function at [facility] 50/50    Time  12     Period  Weeks    Status  New    Target Date  10/06/19            Plan - 09/15/19 4166    Clinical Impression Statement  Pt is 9 1/2 wks s/p tendon repair of 4th digit L hand - pt cont to be limited by scar tissue for flexion of 4th digit - PIP flexin stay about 55 to 60 degrees - and end range extention at [facility] and PIP improve in session -pt encourage to cont HEP 5 x wk and do check of for compliance    OT Occupational Profile and History  Problem Focused Assessment - Including review of records relating to presenting problem    Occupational performance deficits (Please refer to evaluation for details):  ADL's;IADL's;Work;Play;Leisure    Body Structure / Function / Physical Skills  ADL;Decreased knowledge of precautions;Flexibility;ROM;UE functional use;Scar mobility;Skin integrity;IADL;Pain;Strength;Edema;FMC    Rehab Potential  Good    Clinical Decision Making  Limited treatment options, no task modification necessary    Comorbidities Affecting Occupational Performance:  May have comorbidities impacting occupational performance    Modification or Assistance to Complete Evaluation   No modification of tasks or assist necessary to complete eval    OT Frequency  2x / week    OT Duration  2 weeks    OT Treatment/Interventions  Self-care/ADL training;Therapeutic exercise;Patient/family education;Splinting;Paraffin;Fluidtherapy;Contrast Bath;Manual Therapy;Passive range of motion;Scar mobilization    Plan  HEP changes,assess progress and scar massage and tolerance to new splint    OT Home Exercise Plan  see pt instruction    Consulted and Agree with Plan of Care  Patient       Patient will benefit from skilled therapeutic intervention in order to improve the following deficits and impairments:   Body Structure / Function / Physical Skills: ADL, Decreased knowledge of precautions, Flexibility, ROM, UE functional use, Scar mobility, Skin integrity, IADL, Pain, Strength, Edema, FMC        Visit Diagnosis: Muscle weakness (generalized)  Stiffness of left hand, not elsewhere classified  Pain in left hand  Localized edema    Problem List Patient Active Problem List   Diagnosis Date Noted  . CAD (coronary artery disease) 07/05/2018  . Diabetes (HCC) 07/05/2018  . Essential hypertension 07/05/2018  . History of stroke 07/05/2018  . Carotid stenosis 07/05/2018  . Confusion   . Altered mental status 08/08/2017    Oletta Cohn OTR/l,CLT 09/15/2019, 9:20 AM  [facility] PHYSICAL AND SPORTS MEDICINE [address] [place], [address] Phone: [phone]   Fax:  [phone]  Name: Thomas Moore MRN: [id number] Date of Birth: 03/11/1956

## 2019-09-17 ENCOUNTER — Other Ambulatory Visit: Payer: Self-pay

## 2019-09-17 ENCOUNTER — Ambulatory Visit: Payer: 59 | Admitting: Occupational Therapy

## 2019-09-17 DIAGNOSIS — R600 Localized edema: Secondary | ICD-10-CM

## 2019-09-17 DIAGNOSIS — M79642 Pain in left hand: Secondary | ICD-10-CM

## 2019-09-17 DIAGNOSIS — M6281 Muscle weakness (generalized): Secondary | ICD-10-CM | POA: Diagnosis not present

## 2019-09-17 DIAGNOSIS — M25642 Stiffness of left hand, not elsewhere classified: Secondary | ICD-10-CM

## 2019-09-17 NOTE — Patient Instructions (Signed)
same

## 2019-09-17 NOTE — Therapy (Signed)
[facility] [facility] [address], [place], [address] Phone: [phone]   Fax:  [phone]  Occupational Therapy Treatment  Patient Details  Name: Thomas Moore MRN: [id number] Date of Birth: 1956/05/05 Referring Provider (OT): Cranston Neighbor   Encounter Date: 09/17/2019  OT End of Session - 09/17/19 1546    Visit Number  20    Number of Visits  24    Date for OT Re-Evaluation  10/06/19    OT Start Time  1535    OT Stop Time  1618    OT Time Calculation (min)  43 min    Activity Tolerance  Patient tolerated treatment well    Behavior During Therapy  [facility] for tasks assessed/performed       Past Medical History:  Diagnosis Date  . Coronary artery disease   . Diabetes mellitus without complication (HCC)   . GERD (gastroesophageal reflux disease)   . History of single vessel coronary artery bypass   . Hypertension     Past Surgical History:  Procedure Laterality Date  . CORNEAL TRANSPLANT Right   . CORONARY ARTERY BYPASS GRAFT  1997  . FLEXOR TENDON REPAIR Left 07/11/2019   Procedure: FLEXOR tenolysis  REPAIR LEFT RING FINGER with tednon repair;  Surgeon: Kennedy Bucker, MD;  Location: [facility] ORS;  Service: Orthopedics;  Laterality: Left;  . INCISION AND DRAINAGE ABSCESS Left 05/08/2019   Procedure: INCISION AND DRAINAGE ABSCESS;  Surgeon: Donato Heinz, MD;  Location: [facility] ORS;  Service: Orthopedics;  Laterality: Left;    There were no vitals filed for this visit.  Subjective Assessment - 09/17/19 1550    Subjective   I try and use my hand a much as I can - that ring finger just do not want to bend - I would be glad it the hand can work again    Pertinent History  History of bilateral CTS but symptoms decrease - pt had L 4th trigger finger that required release on 04/23/2019 by Dr Rosita Kea -but then pt develop infection with I&D abscess  by Dr Ernest Pine 12/3 - had tendon repair 07/11/2019 after 4th FDP and FDS rupture     Patient Stated Goals  Want to get my hand better so I can do my job    Currently in Pain?  No/denies         [facility] OT Assessment - 09/17/19 0001      Strength   Right Hand Grip (lbs)  55    Right Hand Lateral Pinch  15 lbs    Right Hand 3 Point Pinch  141 lbs    Left Hand Grip (lbs)  30    Left Hand Lateral Pinch  10 lbs    Left Hand 3 Point Pinch  10 lbs      Left Hand AROM   L Index  MCP 0-90  85 Degrees    L Index PIP 0-100  90 Degrees    L Long  MCP 0-90  90 Degrees    L Long PIP 0-100  90 Degrees    L Ring  MCP 0-90  90 Degrees    L Ring PIP 0-100  55 Degrees   60 place and hold    L Little  MCP 0-90  90 Degrees    L Little PIP 0-100  80 Degrees               OT Treatments/Exercises (OP) - 09/17/19 0001  LUE Contrast Bath   Time  --    Comments  intinsic fist  stretch         Pt kept during heat in intrinsic fist to  increase flexion at digits Manual therapy: Scar massage performed with manual techniques by therapist, use of vibration on volar scar on 4th and palm. Use of graston brushing with tool nr 2 on 2nd , 3rd and 5th with PROM/AROM extensionand composite flexion to palm  Continue to Reinforce with pt to touch palm with PROM at home and then place and hold   Therapeutic Exercise: PROM to each digit touching palm - 10 reps PROMand AAROMintrinsic fist- facilitate and done 20 reps  Composite PROM to touch palm 20 reps and PROM digits extention 20reps Place and hold composite fist to palm - 60 at 4th PIPagain today at end of session Add this date gripping of teal putty - can do place and hold 3 sec with 4th digit  12 reps -add to HEP    Opposition AROM to 2nd and 3rd and PROM to 4th and 5th,still unable to actively achieve to 4th and 5th  10reps  Pt doing at home PROM /AAROM over edge of table wrist extention , flexion , RD, UD  10 reps  Prayer stretch for wrist extention 10 repsneeded max A to do correctly But  done this date on BTE CPM for wrist extention 2 x 200 sec  1 lbs weight for wrist flexion/ext 12 reps x 2   Still cannot use for any heavy gripping or squeezing and pushing up thru hand Can use in light activities- and need to bring list of 5 things he used hand in - has hard time bring or naming things he can do or not do with hand  Pt had no issues this date putting on glove or reaching in pocket with L hand   HEP at least4-5x day, Patient usedchecklist for each day thispastweek withcheck off each time he does the exercises and time performed, 5x a day          OT Education - 09/17/19 1546    Education Details  review HEP    Person(s) Educated  Patient    Methods  Explanation;Demonstration;Tactile cues;Verbal cues;Handout    Comprehension  Verbal cues required;Returned demonstration;Verbalized understanding       OT Short Term Goals - 07/14/19 2103      OT SHORT TERM GOAL #1   Title  Pt to be independent with HEP for splint wearing , PROM in splint ,edema control and incision healing    Baseline  no knowledge    Time  3    Period  Weeks    Status  New    Target Date  08/04/19        OT Long Term Goals - 07/14/19 2105      OT LONG TERM GOAL #1   Title  Pt show AROM in L hand to touch palm to use in ADl's    Baseline  Pt only PROM in splint for extention and flexion - 3 days s/p    Time  6    Period  Weeks    Status  New    Target Date  08/25/19      OT LONG TERM GOAL #2   Title  L wrist AROM increase to WNL to turn doorknob, and use in bathing    Baseline  3 days s/p - no AROM - PROM to digits in  splint    Time  6    Period  Weeks    Status  New    Target Date  08/25/19      OT LONG TERM GOAL #3   Title  PRWHE function score improve with more than 20 points    Baseline  eval function at [facility] 50/50    Time  12    Period  Weeks    Status  New    Target Date  10/06/19            Plan - 09/17/19 1547    Clinical Impression Statement   Pt is 10 wks s/p tendon repair on 4th digit trigger finger surgery and drainage surgery for infection last year - pt show increase wrist and digits flexion and extention - but 4th digit PIP stay about 55 to 60 degrees - scar improving - and pt doing better doing HEP since OT made 2 wks ago for pt a check off table  to follow and check of that he have done HEP 5 x day - pt to see surgeon Monday    OT Occupational Profile and History  Problem Focused Assessment - Including review of records relating to presenting problem    Occupational performance deficits (Please refer to evaluation for details):  ADL's;IADL's;Work;Play;Leisure    Body Structure / Function / Physical Skills  ADL;Decreased knowledge of precautions;Flexibility;ROM;UE functional use;Scar mobility;Skin integrity;IADL;Pain;Strength;Edema;FMC    Rehab Potential  Good    Clinical Decision Making  Limited treatment options, no task modification necessary    Comorbidities Affecting Occupational Performance:  May have comorbidities impacting occupational performance    Modification or Assistance to Complete Evaluation   No modification of tasks or assist necessary to complete eval    OT Frequency  2x / week    OT Duration  2 weeks    OT Treatment/Interventions  Self-care/ADL training;Therapeutic exercise;Patient/family education;Splinting;Paraffin;Fluidtherapy;Contrast Bath;Manual Therapy;Passive range of motion;Scar mobilization    Plan  HEP changes,assess progress and scar massage and tolerance to new splint    OT Home Exercise Plan  see pt instruction    Consulted and Agree with Plan of Care  Patient       Patient will benefit from skilled therapeutic intervention in order to improve the following deficits and impairments:   Body Structure / Function / Physical Skills: ADL, Decreased knowledge of precautions, Flexibility, ROM, UE functional use, Scar mobility, Skin integrity, IADL, Pain, Strength, Edema, FMC       Visit  Diagnosis: Muscle weakness (generalized)  Stiffness of left hand, not elsewhere classified  Localized edema  Pain in left hand    Problem List Patient Active Problem List   Diagnosis Date Noted  . CAD (coronary artery disease) 07/05/2018  . Diabetes (HCC) 07/05/2018  . Essential hypertension 07/05/2018  . History of stroke 07/05/2018  . Carotid stenosis 07/05/2018  . Confusion   . Altered mental status 08/08/2017    Oletta Cohn OTR/L,CLT 09/17/2019, 5:36 PM  [place] [facility] [address] [place], [address] Phone: [phone]   Fax:  [phone]  Name: Thomas Moore MRN: [id number] Date of Birth: 1955-07-28

## 2019-09-25 ENCOUNTER — Ambulatory Visit: Payer: 59 | Admitting: Occupational Therapy

## 2019-09-25 ENCOUNTER — Other Ambulatory Visit: Payer: Self-pay

## 2019-09-25 DIAGNOSIS — R600 Localized edema: Secondary | ICD-10-CM

## 2019-09-25 DIAGNOSIS — M6281 Muscle weakness (generalized): Secondary | ICD-10-CM

## 2019-09-25 DIAGNOSIS — M79642 Pain in left hand: Secondary | ICD-10-CM

## 2019-09-25 DIAGNOSIS — M25642 Stiffness of left hand, not elsewhere classified: Secondary | ICD-10-CM

## 2019-09-25 NOTE — Patient Instructions (Signed)
See note

## 2019-09-25 NOTE — Therapy (Signed)
[facility] [facility] [address] [place], [address] Phone: [phone]   Fax:  [phone]  Occupational Therapy Treatment  Patient Details  Name: Thomas Moore MRN: [id number] Date of Birth: Dec 08, 1955 Referring Provider (OT): Cranston Neighbor   Encounter Date: 09/25/2019  OT End of Session - 09/25/19 0923    Visit Number  21    Number of Visits  24    Date for OT Re-Evaluation  10/06/19    OT Start Time  0906    OT Stop Time  0947    OT Time Calculation (min)  41 min    Activity Tolerance  Patient tolerated treatment well    Behavior During Therapy  [facility] for tasks assessed/performed       Past Medical History:  Diagnosis Date  . Coronary artery disease   . Diabetes mellitus without complication (HCC)   . GERD (gastroesophageal reflux disease)   . History of single vessel coronary artery bypass   . Hypertension     Past Surgical History:  Procedure Laterality Date  . CORNEAL TRANSPLANT Right   . CORONARY ARTERY BYPASS GRAFT  1997  . FLEXOR TENDON REPAIR Left 07/11/2019   Procedure: FLEXOR tenolysis  REPAIR LEFT RING FINGER with tednon repair;  Surgeon: Kennedy Bucker, MD;  Location: [facility] ORS;  Service: Orthopedics;  Laterality: Left;  . INCISION AND DRAINAGE ABSCESS Left 05/08/2019   Procedure: INCISION AND DRAINAGE ABSCESS;  Surgeon: Donato Heinz, MD;  Location: [facility] ORS;  Service: Orthopedics;  Laterality: Left;    There were no vitals filed for this visit.  Subjective Assessment - 09/25/19 0921    Subjective   I seen the Dr earlier this week - and he said that my finger is not going to get better - and to file for disability - I had a lot of other health issues - that I don't know how much I would be able to work    Pertinent History  History of bilateral CTS but symptoms decrease - pt had L 4th trigger finger that required release on 04/23/2019 by Dr Rosita Kea -but then pt develop infection with I&D abscess   by Dr Ernest Pine 12/3 - had tendon repair 07/11/2019 after 4th FDP and FDS rupture    Patient Stated Goals  Want to get my hand better so I can do my job    Currently in Pain?  No/denies             Pt arrive with wrist stiff in flexion , ext and digits stiffness - reinforce importance for pt to do HEP stretches 5 x day and strengthening 2 x day        OT Treatments/Exercises (OP) - 09/25/19 0001      LUE Paraffin   Number Minutes Paraffin  8 Minutes    LUE Paraffin Location  Hand    Comments  coban flexion wrap to digits at [facility] prior to soft tissue and ROM      Pt initially PIP of 4th 50 and during session place and hold 55 degrees    Manual therapy: Scar massage performed with manual techniques by therapist, use of vibration on volar scar on 4th and palm. Use of graston brushing with tool nr 2 on 2nd , 3rd and 5th with PROM/AROM extensionand composite flexion to palm  Continue to Reinforce with pt to touch palm with PROM at home and then place and hold/ and wrist flexion and extention  stretches    Therapeutic Exercise: PROM to each digit touching palm - 10 reps PROMand AAROMintrinsic fist- facilitate and done 20 reps  Composite PROM to touch palm 20 reps and PROM digits extention 20reps Place and hold composite fist to palm - 60 at 4th PIPagain today at end of session  gripping of teal putty - can do place and hold 3 sec with 4th digit 2 x 12 reps - And done and add lat grip and 3 point grip into putty to HEP -12 reps  Can increase to 2nd and 3rd set in 3 and 6 days  Change wrist flexion and extention stretches over armrest using R hand - hold 5 sec - 10 reps  And then done and add to HEP 2 lbs weight for wrist in all planes - had no pain and issues -just need to place 4th digit around weight  12 reps   can increase to 2nd and 3rd set in 3 and 6 days      HEP stretches at least4-5x day, Patient usedchecklist for each day thispastweek  withcheck off each time he does the exercises and time performed, 5x a day And strengthening 2 x day           OT Education - 09/25/19 0923    Education Details  review HEP changes    Person(s) Educated  Patient    Methods  Explanation;Demonstration;Tactile cues;Verbal cues;Handout    Comprehension  Verbal cues required;Returned demonstration;Verbalized understanding       OT Short Term Goals - 07/14/19 2103      OT SHORT TERM GOAL #1   Title  Pt to be independent with HEP for splint wearing , PROM in splint ,edema control and incision healing    Baseline  no knowledge    Time  3    Period  Weeks    Status  New    Target Date  08/04/19        OT Long Term Goals - 07/14/19 2105      OT LONG TERM GOAL #1   Title  Pt show AROM in L hand to touch palm to use in ADl's    Baseline  Pt only PROM in splint for extention and flexion - 3 days s/p    Time  6    Period  Weeks    Status  New    Target Date  08/25/19      OT LONG TERM GOAL #2   Title  L wrist AROM increase to WNL to turn doorknob, and use in bathing    Baseline  3 days s/p - no AROM - PROM to digits in splint    Time  6    Period  Weeks    Status  New    Target Date  08/25/19      OT LONG TERM GOAL #3   Title  PRWHE function score improve with more than 20 points    Baseline  eval function at [facility] 50/50    Time  12    Period  Weeks    Status  New    Target Date  10/06/19            Plan - 09/25/19 0347    Clinical Impression Statement  Pt is 11wks s/p tendon repair of 4th digit- after having 2 trigger finger surgery and drainage of infection last year - pt having hard time reporting  increase functinonal use , reinforce for pt to  work on wrist AROM , add 2 lbs weight to wrist HEP this date , and also doing flexion stretches and putty for gripping - 4th digit same - did see surgeon and recommend for pt to apply for disability - has other medical issues to per pt    OT Occupational Profile  and History  Problem Focused Assessment - Including review of records relating to presenting problem    Occupational performance deficits (Please refer to evaluation for details):  ADL's;IADL's;Work;Play;Leisure    Body Structure / Function / Physical Skills  ADL;Decreased knowledge of precautions;Flexibility;ROM;UE functional use;Scar mobility;Skin integrity;IADL;Pain;Strength;Edema;FMC    Rehab Potential  Good    Clinical Decision Making  Limited treatment options, no task modification necessary    Comorbidities Affecting Occupational Performance:  May have comorbidities impacting occupational performance    Modification or Assistance to Complete Evaluation   No modification of tasks or assist necessary to complete eval    OT Frequency  1x / week    OT Duration  4 weeks    OT Treatment/Interventions  Self-care/ADL training;Therapeutic exercise;Patient/family education;Splinting;Paraffin;Fluidtherapy;Contrast Bath;Manual Therapy;Passive range of motion;Scar mobilization    Plan  HEP changes,assess progress and scar massage and tolerance to new splint    OT Home Exercise Plan  see pt instruction    Consulted and Agree with Plan of Care  Patient       Patient will benefit from skilled therapeutic intervention in order to improve the following deficits and impairments:   Body Structure / Function / Physical Skills: ADL, Decreased knowledge of precautions, Flexibility, ROM, UE functional use, Scar mobility, Skin integrity, IADL, Pain, Strength, Edema, FMC       Visit Diagnosis: Muscle weakness (generalized)  Stiffness of left hand, not elsewhere classified  Localized edema  Pain in left hand    Problem List Patient Active Problem List   Diagnosis Date Noted  . CAD (coronary artery disease) 07/05/2018  . Diabetes (HCC) 07/05/2018  . Essential hypertension 07/05/2018  . History of stroke 07/05/2018  . Carotid stenosis 07/05/2018  . Confusion   . Altered mental status 08/08/2017     Oletta Cohn OTR/L,CLT 09/25/2019, 1:32 PM  [place] [facility]TS [facility] [address], [place], [address] Phone: [phone]   Fax:  [phone]  Name: Thomas Moore MRN: [id number] Date of Birth: 1956-02-22

## 2019-09-29 ENCOUNTER — Emergency Department (HOSPITAL_COMMUNITY): Payer: No Typology Code available for payment source

## 2019-09-29 ENCOUNTER — Encounter (HOSPITAL_COMMUNITY): Payer: Self-pay | Admitting: *Deleted

## 2019-09-29 ENCOUNTER — Other Ambulatory Visit: Payer: Self-pay

## 2019-09-29 ENCOUNTER — Inpatient Hospital Stay (HOSPITAL_COMMUNITY)
Admission: EM | Admit: 2019-09-29 | Discharge: 2019-10-09 | DRG: 040 | Disposition: A | Discharge status: Home Health | Payer: No Typology Code available for payment source | Attending: Internal Medicine | Admitting: Internal Medicine

## 2019-09-29 DIAGNOSIS — Z86711 Personal history of pulmonary embolism: Secondary | ICD-10-CM | POA: Diagnosis not present

## 2019-09-29 DIAGNOSIS — J449 Chronic obstructive pulmonary disease, unspecified: Secondary | ICD-10-CM | POA: Diagnosis present

## 2019-09-29 DIAGNOSIS — Y92413 State road as the place of occurrence of the external cause: Secondary | ICD-10-CM

## 2019-09-29 DIAGNOSIS — G40909 Epilepsy, unspecified, not intractable, without status epilepticus: Principal | ICD-10-CM | POA: Diagnosis present

## 2019-09-29 DIAGNOSIS — S0081XA Abrasion of other part of head, initial encounter: Secondary | ICD-10-CM | POA: Diagnosis present

## 2019-09-29 DIAGNOSIS — I471 Supraventricular tachycardia, unspecified: Secondary | ICD-10-CM

## 2019-09-29 DIAGNOSIS — Z87891 Personal history of nicotine dependence: Secondary | ICD-10-CM

## 2019-09-29 DIAGNOSIS — Z951 Presence of aortocoronary bypass graft: Secondary | ICD-10-CM | POA: Diagnosis not present

## 2019-09-29 DIAGNOSIS — I214 Non-ST elevation (NSTEMI) myocardial infarction: Secondary | ICD-10-CM

## 2019-09-29 DIAGNOSIS — Z7982 Long term (current) use of aspirin: Secondary | ICD-10-CM | POA: Diagnosis not present

## 2019-09-29 DIAGNOSIS — I350 Nonrheumatic aortic (valve) stenosis: Secondary | ICD-10-CM

## 2019-09-29 DIAGNOSIS — Z7984 Long term (current) use of oral hypoglycemic drugs: Secondary | ICD-10-CM

## 2019-09-29 DIAGNOSIS — Z823 Family history of stroke: Secondary | ICD-10-CM | POA: Diagnosis not present

## 2019-09-29 DIAGNOSIS — I493 Ventricular premature depolarization: Secondary | ICD-10-CM | POA: Diagnosis not present

## 2019-09-29 DIAGNOSIS — Z87898 Personal history of other specified conditions: Secondary | ICD-10-CM | POA: Diagnosis not present

## 2019-09-29 DIAGNOSIS — Z8673 Personal history of transient ischemic attack (TIA), and cerebral infarction without residual deficits: Secondary | ICD-10-CM | POA: Diagnosis not present

## 2019-09-29 DIAGNOSIS — N289 Disorder of kidney and ureter, unspecified: Secondary | ICD-10-CM | POA: Diagnosis not present

## 2019-09-29 DIAGNOSIS — R7989 Other specified abnormal findings of blood chemistry: Secondary | ICD-10-CM

## 2019-09-29 DIAGNOSIS — R000 Tachycardia, unspecified: Secondary | ICD-10-CM | POA: Diagnosis not present

## 2019-09-29 DIAGNOSIS — E785 Hyperlipidemia, unspecified: Secondary | ICD-10-CM | POA: Diagnosis present

## 2019-09-29 DIAGNOSIS — I2510 Atherosclerotic heart disease of native coronary artery without angina pectoris: Secondary | ICD-10-CM | POA: Diagnosis not present

## 2019-09-29 DIAGNOSIS — R4020 Unspecified coma: Secondary | ICD-10-CM

## 2019-09-29 DIAGNOSIS — Z5181 Encounter for therapeutic drug level monitoring: Secondary | ICD-10-CM | POA: Diagnosis not present

## 2019-09-29 DIAGNOSIS — Z79899 Other long term (current) drug therapy: Secondary | ICD-10-CM | POA: Diagnosis not present

## 2019-09-29 DIAGNOSIS — N400 Enlarged prostate without lower urinary tract symptoms: Secondary | ICD-10-CM | POA: Diagnosis present

## 2019-09-29 DIAGNOSIS — E1151 Type 2 diabetes mellitus with diabetic peripheral angiopathy without gangrene: Secondary | ICD-10-CM | POA: Diagnosis present

## 2019-09-29 DIAGNOSIS — R55 Syncope and collapse: Secondary | ICD-10-CM | POA: Diagnosis not present

## 2019-09-29 DIAGNOSIS — N179 Acute kidney failure, unspecified: Secondary | ICD-10-CM | POA: Diagnosis present

## 2019-09-29 DIAGNOSIS — I25810 Atherosclerosis of coronary artery bypass graft(s) without angina pectoris: Secondary | ICD-10-CM | POA: Diagnosis present

## 2019-09-29 DIAGNOSIS — R011 Cardiac murmur, unspecified: Secondary | ICD-10-CM

## 2019-09-29 DIAGNOSIS — Z86718 Personal history of other venous thrombosis and embolism: Secondary | ICD-10-CM | POA: Diagnosis not present

## 2019-09-29 DIAGNOSIS — I10 Essential (primary) hypertension: Secondary | ICD-10-CM | POA: Diagnosis not present

## 2019-09-29 DIAGNOSIS — R778 Other specified abnormalities of plasma proteins: Secondary | ICD-10-CM | POA: Diagnosis not present

## 2019-09-29 DIAGNOSIS — Z7901 Long term (current) use of anticoagulants: Secondary | ICD-10-CM | POA: Diagnosis not present

## 2019-09-29 DIAGNOSIS — Z20822 Contact with and (suspected) exposure to covid-19: Secondary | ICD-10-CM | POA: Diagnosis present

## 2019-09-29 DIAGNOSIS — E119 Type 2 diabetes mellitus without complications: Secondary | ICD-10-CM | POA: Diagnosis not present

## 2019-09-29 HISTORY — DX: Unspecified convulsions: R56.9

## 2019-09-29 HISTORY — DX: Cerebral infarction, unspecified: I63.9

## 2019-09-29 HISTORY — DX: Chronic obstructive pulmonary disease, unspecified: J44.9

## 2019-09-29 HISTORY — DX: Atherosclerosis of coronary artery bypass graft(s) without angina pectoris: I25.810

## 2019-09-29 HISTORY — DX: Hyperlipidemia, unspecified: E78.5

## 2019-09-29 LAB — I-STAT CHEM 8, ED
BUN: 21 mg/dL (ref 8–23)
Calcium, Ion: 0.8 mmol/L — CL (ref 1.15–1.40)
Chloride: 106 mmol/L (ref 98–111)
Creatinine, Ser: 1 mg/dL (ref 0.61–1.24)
Glucose, Bld: 247 mg/dL — ABNORMAL HIGH (ref 70–99)
HCT: 42 % (ref 39.0–52.0)
Hemoglobin: 14.3 g/dL (ref 13.0–17.0)
Potassium: 4.1 mmol/L (ref 3.5–5.1)
Sodium: 134 mmol/L — ABNORMAL LOW (ref 135–145)
TCO2: 17 mmol/L — ABNORMAL LOW (ref 22–32)

## 2019-09-29 LAB — COMPREHENSIVE METABOLIC PANEL
ALT: 17 U/L (ref 0–44)
AST: 29 U/L (ref 15–41)
Albumin: 3.3 g/dL — ABNORMAL LOW (ref 3.5–5.0)
Alkaline Phosphatase: 69 U/L (ref 38–126)
Anion gap: 22 — ABNORMAL HIGH (ref 5–15)
BUN: 20 mg/dL (ref 8–23)
CO2: 18 mmol/L — ABNORMAL LOW (ref 22–32)
Calcium: 8.9 mg/dL (ref 8.9–10.3)
Chloride: 99 mmol/L (ref 98–111)
Creatinine, Ser: 1.26 mg/dL — ABNORMAL HIGH (ref 0.61–1.24)
GFR calc Af Amer: >60 mL/min (ref >60)
GFR calc non Af Amer: 60 mL/min — ABNORMAL LOW (ref >60)
Glucose, Bld: 252 mg/dL — ABNORMAL HIGH (ref 70–99)
Potassium: 4.3 mmol/L (ref 3.5–5.1)
Sodium: 139 mmol/L (ref 135–145)
Total Bilirubin: 0.3 mg/dL (ref 0.3–1.2)
Total Protein: 7.3 g/dL (ref 6.5–8.1)

## 2019-09-29 LAB — CBC WITH DIFFERENTIAL/PLATELET
Abs Immature Granulocytes: 0.06 10*3/uL (ref 0.00–0.07)
Basophils Absolute: 0 10*3/uL (ref 0.0–0.1)
Basophils Relative: 0 %
Eosinophils Absolute: 0.1 10*3/uL (ref 0.0–0.5)
Eosinophils Relative: 1 %
HCT: 43.6 % (ref 39.0–52.0)
Hemoglobin: 12.8 g/dL — ABNORMAL LOW (ref 13.0–17.0)
Immature Granulocytes: 1 %
Lymphocytes Relative: 22 %
Lymphs Abs: 1.9 10*3/uL (ref 0.7–4.0)
MCH: 27.9 pg (ref 26.0–34.0)
MCHC: 29.4 g/dL — ABNORMAL LOW (ref 30.0–36.0)
MCV: 95.2 fL (ref 80.0–100.0)
Monocytes Absolute: 0.5 10*3/uL (ref 0.1–1.0)
Monocytes Relative: 6 %
Neutro Abs: 6 10*3/uL (ref 1.7–7.7)
Neutrophils Relative %: 70 %
Platelets: 215 10*3/uL (ref 150–400)
RBC: 4.58 MIL/uL (ref 4.22–5.81)
RDW: 13.9 % (ref 11.5–15.5)
WBC: 8.6 10*3/uL (ref 4.0–10.5)
nRBC: 0 % (ref 0.0–0.2)

## 2019-09-29 LAB — POCT I-STAT EG7
Acid-Base Excess: 6 mmol/L — ABNORMAL HIGH (ref 0.0–2.0)
Bicarbonate: 32.1 mmol/L — ABNORMAL HIGH (ref 20.0–28.0)
Calcium, Ion: 1.09 mmol/L — ABNORMAL LOW (ref 1.15–1.40)
HCT: 38 % — ABNORMAL LOW (ref 39.0–52.0)
Hemoglobin: 12.9 g/dL — ABNORMAL LOW (ref 13.0–17.0)
O2 Saturation: 60 %
Potassium: 4.2 mmol/L (ref 3.5–5.1)
Sodium: 137 mmol/L (ref 135–145)
TCO2: 34 mmol/L — ABNORMAL HIGH (ref 22–32)
pCO2, Ven: 49.9 mmHg (ref 44.0–60.0)
pH, Ven: 7.417 (ref 7.250–7.430)
pO2, Ven: 31 mmHg — CL (ref 32.0–45.0)

## 2019-09-29 LAB — PROTIME-INR
INR: 2.2 — ABNORMAL HIGH (ref 0.8–1.2)
Prothrombin Time: 24 seconds — ABNORMAL HIGH (ref 11.4–15.2)

## 2019-09-29 LAB — LIPASE, BLOOD: Lipase: 61 U/L — ABNORMAL HIGH (ref 11–51)

## 2019-09-29 LAB — URINALYSIS, ROUTINE W REFLEX MICROSCOPIC
Bilirubin Urine: NEGATIVE
Glucose, UA: NEGATIVE mg/dL
Hgb urine dipstick: NEGATIVE
Ketones, ur: 20 mg/dL — AB
Leukocytes,Ua: NEGATIVE
Nitrite: NEGATIVE
Protein, ur: 100 mg/dL — AB
Specific Gravity, Urine: 1.016 (ref 1.005–1.030)
pH: 5 (ref 5.0–8.0)

## 2019-09-29 LAB — HEMOGLOBIN A1C
Hgb A1c MFr Bld: 6.6 % — ABNORMAL HIGH (ref 4.8–5.6)
Mean Plasma Glucose: 142.72 mg/dL

## 2019-09-29 LAB — HIV ANTIBODY (ROUTINE TESTING W REFLEX): HIV Screen 4th Generation wRfx: NONREACTIVE

## 2019-09-29 LAB — GLUCOSE, CAPILLARY: Glucose-Capillary: 81 mg/dL (ref 70–99)

## 2019-09-29 LAB — SARS CORONAVIRUS 2 (TAT 6-24 HRS): SARS Coronavirus 2: NEGATIVE

## 2019-09-29 LAB — CBG MONITORING, ED
Glucose-Capillary: 232 mg/dL — ABNORMAL HIGH (ref 70–99)
Glucose-Capillary: 97 mg/dL (ref 70–99)
Glucose-Capillary: 94 mg/dL (ref 70–99)

## 2019-09-29 LAB — TROPONIN I (HIGH SENSITIVITY)
Troponin I (High Sensitivity): 57 ng/L — ABNORMAL HIGH (ref <18)
Troponin I (High Sensitivity): 889 ng/L (ref <18)

## 2019-09-29 LAB — LACTIC ACID, PLASMA
Lactic Acid, Venous: 1.7 mmol/L (ref 0.5–1.9)
Lactic Acid, Venous: 1.6 mmol/L (ref 0.5–1.9)

## 2019-09-29 LAB — VALPROIC ACID LEVEL: Valproic Acid Lvl: <10 ug/mL — ABNORMAL LOW (ref 50.0–100.0)

## 2019-09-29 LAB — MAGNESIUM: Magnesium: 1.3 mg/dL — ABNORMAL LOW (ref 1.7–2.4)

## 2019-09-29 MED ORDER — ACETAMINOPHEN 325 MG PO TABS: 650.0000 mg | ORAL_TABLET | Freq: Four times a day (QID) | ORAL | Status: DC | PRN

## 2019-09-29 MED ORDER — PANTOPRAZOLE SODIUM 40 MG PO TBEC
80.0000 mg | DELAYED_RELEASE_TABLET | Freq: Every day | ORAL | Status: DC
  Administered 2019-09-29 – 2019-10-09 (×11): 80 mg via ORAL
  Filled 2019-09-29 (×11): qty 2

## 2019-09-29 MED ORDER — PROMETHAZINE HCL 25 MG PO TABS: 12.5000 mg | ORAL_TABLET | Freq: Four times a day (QID) | ORAL | Status: DC | PRN

## 2019-09-29 MED ORDER — METOPROLOL TARTRATE 25 MG PO TABS
25.0000 mg | ORAL_TABLET | Freq: Two times a day (BID) | ORAL | Status: DC
  Administered 2019-09-29 – 2019-09-30 (×3): 25 mg via ORAL
  Filled 2019-09-29 (×3): qty 1

## 2019-09-29 MED ORDER — SENNOSIDES-DOCUSATE SODIUM 8.6-50 MG PO TABS: 1.0000 | ORAL_TABLET | Freq: Every evening | ORAL | Status: DC | PRN

## 2019-09-29 MED ORDER — ATORVASTATIN CALCIUM 80 MG PO TABS
80.0000 mg | ORAL_TABLET | Freq: Every day | ORAL | Status: DC
  Administered 2019-09-29 – 2019-10-09 (×11): 80 mg via ORAL
  Filled 2019-09-29 (×11): qty 1

## 2019-09-29 MED ORDER — CALCIUM CITRATE-VITAMIN D 315-200 MG-UNIT PO TABS: 1.0000 | ORAL_TABLET | Freq: Two times a day (BID) | ORAL | Status: DC

## 2019-09-29 MED ORDER — INSULIN ASPART 100 UNIT/ML ~~LOC~~ SOLN
0.0000 [IU] | Freq: Every day | SUBCUTANEOUS | Status: DC
  Administered 2019-10-02: 3 [IU] via SUBCUTANEOUS
  Administered 2019-10-03: 23:00:00 2 [IU] via SUBCUTANEOUS
  Administered 2019-10-04 – 2019-10-08 (×4): 3 [IU] via SUBCUTANEOUS

## 2019-09-29 MED ORDER — INSULIN ASPART 100 UNIT/ML ~~LOC~~ SOLN
0.0000 [IU] | Freq: Three times a day (TID) | SUBCUTANEOUS | Status: DC
  Administered 2019-09-30: 09:00:00 2 [IU] via SUBCUTANEOUS
  Administered 2019-10-01 – 2019-10-03 (×7): 3 [IU] via SUBCUTANEOUS
  Administered 2019-10-04: 13:00:00 11 [IU] via SUBCUTANEOUS
  Administered 2019-10-04: 3 [IU] via SUBCUTANEOUS
  Administered 2019-10-04 – 2019-10-05 (×2): 5 [IU] via SUBCUTANEOUS
  Administered 2019-10-05: 8 [IU] via SUBCUTANEOUS
  Administered 2019-10-05 – 2019-10-06 (×2): 3 [IU] via SUBCUTANEOUS
  Administered 2019-10-06: 11 [IU] via SUBCUTANEOUS
  Administered 2019-10-06: 3 [IU] via SUBCUTANEOUS
  Administered 2019-10-07: 13:00:00 11 [IU] via SUBCUTANEOUS
  Administered 2019-10-07: 8 [IU] via SUBCUTANEOUS
  Administered 2019-10-07 – 2019-10-08 (×2): 5 [IU] via SUBCUTANEOUS
  Administered 2019-10-08: 8 [IU] via SUBCUTANEOUS
  Administered 2019-10-08: 5 [IU] via SUBCUTANEOUS
  Administered 2019-10-09: 09:00:00 3 [IU] via SUBCUTANEOUS

## 2019-09-29 MED ORDER — IOHEXOL 300 MG/ML  SOLN
100.0000 mL | Freq: Once | INTRAMUSCULAR | Status: AC | PRN
  Administered 2019-09-29: 10:00:00 100 mL via INTRAVENOUS

## 2019-09-29 MED ORDER — ASPIRIN EC 81 MG PO TBEC
81.0000 mg | DELAYED_RELEASE_TABLET | Freq: Every day | ORAL | Status: DC
  Administered 2019-09-29 – 2019-10-09 (×11): 81 mg via ORAL
  Filled 2019-09-29 (×11): qty 1

## 2019-09-29 MED ORDER — CALCIUM CARBONATE-VITAMIN D 500-200 MG-UNIT PO TABS
1.0000 | ORAL_TABLET | Freq: Two times a day (BID) | ORAL | Status: DC
  Administered 2019-09-30 – 2019-10-09 (×19): 1 via ORAL
  Filled 2019-09-29 (×19): qty 1

## 2019-09-29 MED ORDER — ACETAMINOPHEN 650 MG RE SUPP: 650.0000 mg | Freq: Four times a day (QID) | RECTAL | Status: DC | PRN

## 2019-09-29 MED ORDER — MAGNESIUM SULFATE 2 GM/50ML IV SOLN
2.0000 g | Freq: Once | INTRAVENOUS | Status: AC
  Administered 2019-09-29: 11:00:00 2 g via INTRAVENOUS
  Filled 2019-09-29: qty 50

## 2019-09-29 MED ORDER — DIVALPROEX SODIUM 250 MG PO DR TAB
250.0000 mg | DELAYED_RELEASE_TABLET | Freq: Two times a day (BID) | ORAL | Status: DC
  Administered 2019-09-29 – 2019-09-30 (×2): 250 mg via ORAL
  Filled 2019-09-29 (×2): qty 1

## 2019-09-29 MED ORDER — METOPROLOL TARTRATE 5 MG/5ML IV SOLN
2.5000 mg | Freq: Once | INTRAVENOUS | Status: AC
  Administered 2019-09-29: 15:00:00 2.5 mg via INTRAVENOUS
  Filled 2019-09-29: qty 5

## 2019-09-29 NOTE — ED Notes (Signed)
Attempted to call ex. Wife per patient request [phone] no answer message left.

## 2019-09-29 NOTE — ED Notes (Signed)
Jeans and t shirt cut by EMS , patient only had one tennis shoe and he was not wearing glasses upon arrival

## 2019-09-29 NOTE — Progress Notes (Signed)
Orthopedic Tech Progress Note Patient Details:  Thomas Moore 19-Oct-1955 [id number] Level 2 trauma Patient ID: Thomas Moore, male   DOB: May 29, 1956, 64 y.o.   MRN: [id number]   [name] 09/29/2019, 8:06 AM
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
## 2019-09-29 NOTE — ED Notes (Signed)
Placed Pt on 2L Nasal O2. Pt's Pulse is now 94%. Pt transported to CT

## 2019-09-29 NOTE — ED Notes (Signed)
Ex wife called back update
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
## 2019-09-29 NOTE — ED Notes (Signed)
Patient also only had right hearing aid in.

## 2019-09-29 NOTE — ED Notes (Signed)
Patient also only had right hearing aid in.  

## 2019-09-29 NOTE — ED Notes (Signed)
Trop. Of 889 given to Dr. Madilyn Hook

## 2019-09-29 NOTE — ED Notes (Signed)
Attempted to call report x 1  

## 2019-09-29 NOTE — ED Notes (Signed)
Brother called and update given at patient request

## 2019-09-30 ENCOUNTER — Other Ambulatory Visit: Payer: Self-pay

## 2019-09-30 ENCOUNTER — Inpatient Hospital Stay (HOSPITAL_COMMUNITY): Payer: No Typology Code available for payment source

## 2019-09-30 ENCOUNTER — Encounter (HOSPITAL_COMMUNITY): Payer: Self-pay | Admitting: Internal Medicine

## 2019-09-30 DIAGNOSIS — I350 Nonrheumatic aortic (valve) stenosis: Secondary | ICD-10-CM

## 2019-09-30 DIAGNOSIS — R000 Tachycardia, unspecified: Secondary | ICD-10-CM

## 2019-09-30 DIAGNOSIS — Z79899 Other long term (current) drug therapy: Secondary | ICD-10-CM

## 2019-09-30 DIAGNOSIS — R778 Other specified abnormalities of plasma proteins: Secondary | ICD-10-CM | POA: Diagnosis not present

## 2019-09-30 DIAGNOSIS — R4020 Unspecified coma: Secondary | ICD-10-CM | POA: Diagnosis not present

## 2019-09-30 DIAGNOSIS — I2510 Atherosclerotic heart disease of native coronary artery without angina pectoris: Secondary | ICD-10-CM | POA: Diagnosis not present

## 2019-09-30 LAB — CBC
HCT: 39.4 % (ref 39.0–52.0)
Hemoglobin: 12.7 g/dL — ABNORMAL LOW (ref 13.0–17.0)
MCH: 28.6 pg (ref 26.0–34.0)
MCHC: 32.2 g/dL (ref 30.0–36.0)
MCV: 88.7 fL (ref 80.0–100.0)
Platelets: 221 10*3/uL (ref 150–400)
RBC: 4.44 MIL/uL (ref 4.22–5.81)
RDW: 14.1 % (ref 11.5–15.5)
WBC: 6.8 10*3/uL (ref 4.0–10.5)
nRBC: 0 % (ref 0.0–0.2)

## 2019-09-30 LAB — BASIC METABOLIC PANEL
Anion gap: 12 (ref 5–15)
BUN: 16 mg/dL (ref 8–23)
CO2: 27 mmol/L (ref 22–32)
Calcium: 8.9 mg/dL (ref 8.9–10.3)
Chloride: 97 mmol/L — ABNORMAL LOW (ref 98–111)
Creatinine, Ser: 1.18 mg/dL (ref 0.61–1.24)
GFR calc Af Amer: >60 mL/min (ref >60)
GFR calc non Af Amer: >60 mL/min (ref >60)
Glucose, Bld: 102 mg/dL — ABNORMAL HIGH (ref 70–99)
Potassium: 4.2 mmol/L (ref 3.5–5.1)
Sodium: 136 mmol/L (ref 135–145)

## 2019-09-30 LAB — LIPID PANEL
Cholesterol: 131 mg/dL (ref 0–200)
HDL: 28 mg/dL — ABNORMAL LOW (ref >40)
LDL Cholesterol: 79 mg/dL (ref 0–99)
Total CHOL/HDL Ratio: 4.7 RATIO
Triglycerides: 121 mg/dL (ref <150)
VLDL: 24 mg/dL (ref 0–40)

## 2019-09-30 LAB — ECHOCARDIOGRAM COMPLETE
Height: 63 in
Weight: 2624.36 oz

## 2019-09-30 LAB — GLUCOSE, CAPILLARY
Glucose-Capillary: 120 mg/dL — ABNORMAL HIGH (ref 70–99)
Glucose-Capillary: 136 mg/dL — ABNORMAL HIGH (ref 70–99)
Glucose-Capillary: 107 mg/dL — ABNORMAL HIGH (ref 70–99)
Glucose-Capillary: 104 mg/dL — ABNORMAL HIGH (ref 70–99)
Glucose-Capillary: 198 mg/dL — ABNORMAL HIGH (ref 70–99)

## 2019-09-30 LAB — PROTIME-INR
INR: 2.2 — ABNORMAL HIGH (ref 0.8–1.2)
Prothrombin Time: 24.2 seconds — ABNORMAL HIGH (ref 11.4–15.2)

## 2019-09-30 LAB — TROPONIN I (HIGH SENSITIVITY): Troponin I (High Sensitivity): 12610 ng/L (ref <18)

## 2019-09-30 MED ORDER — METOPROLOL TARTRATE 50 MG PO TABS
50.0000 mg | ORAL_TABLET | Freq: Two times a day (BID) | ORAL | Status: DC
  Administered 2019-09-30 – 2019-10-01 (×2): 50 mg via ORAL
  Filled 2019-09-30 (×2): qty 1

## 2019-09-30 MED ORDER — DEXTROSE 50 % IV SOLN: 1.0000 | Freq: Once | INTRAVENOUS | Status: DC

## 2019-09-30 MED ORDER — PERFLUTREN LIPID MICROSPHERE
1.0000 mL | INTRAVENOUS | Status: AC | PRN
  Administered 2019-09-30: 13:00:00 2 mL via INTRAVENOUS
  Filled 2019-09-30: qty 10

## 2019-09-30 MED ORDER — DIVALPROEX SODIUM 500 MG PO DR TAB
500.0000 mg | DELAYED_RELEASE_TABLET | Freq: Two times a day (BID) | ORAL | Status: DC
  Administered 2019-09-30 – 2019-10-09 (×18): 500 mg via ORAL
  Filled 2019-09-30 (×18): qty 1
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
## 2019-09-30 NOTE — Progress Notes (Signed)
  Echocardiogram 2D Echocardiogram definity has been performed.  Leta Jungling M 09/30/2019, 1:34 PM
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
## 2019-09-30 NOTE — Plan of Care (Signed)
  Problem: Education: Goal: Knowledge of General Education information will improve Description Including pain rating scale, medication(s)/side effects and non-pharmacologic comfort measures Outcome: Progressing   
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
## 2019-10-01 DIAGNOSIS — I2510 Atherosclerotic heart disease of native coronary artery without angina pectoris: Secondary | ICD-10-CM | POA: Diagnosis not present

## 2019-10-01 DIAGNOSIS — R778 Other specified abnormalities of plasma proteins: Secondary | ICD-10-CM | POA: Diagnosis not present

## 2019-10-01 DIAGNOSIS — R4020 Unspecified coma: Secondary | ICD-10-CM | POA: Diagnosis not present

## 2019-10-01 DIAGNOSIS — R000 Tachycardia, unspecified: Secondary | ICD-10-CM | POA: Diagnosis not present

## 2019-10-01 LAB — GLUCOSE, CAPILLARY
Glucose-Capillary: 187 mg/dL — ABNORMAL HIGH (ref 70–99)
Glucose-Capillary: 164 mg/dL — ABNORMAL HIGH (ref 70–99)
Glucose-Capillary: 198 mg/dL — ABNORMAL HIGH (ref 70–99)
Glucose-Capillary: 185 mg/dL — ABNORMAL HIGH (ref 70–99)

## 2019-10-01 LAB — PROTIME-INR
INR: 2.1 — ABNORMAL HIGH (ref 0.8–1.2)
Prothrombin Time: 23.1 seconds — ABNORMAL HIGH (ref 11.4–15.2)

## 2019-10-01 LAB — TROPONIN I (HIGH SENSITIVITY): Troponin I (High Sensitivity): 7400 ng/L (ref <18)

## 2019-10-01 MED ORDER — METOPROLOL TARTRATE 50 MG PO TABS
75.0000 mg | ORAL_TABLET | Freq: Two times a day (BID) | ORAL | Status: DC
  Administered 2019-10-01 – 2019-10-02 (×3): 75 mg via ORAL
  Filled 2019-10-01 (×3): qty 1
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
## 2019-10-01 NOTE — Care Management (Signed)
Entered benefit check for   apixaban 5mg  po bid,for 30 days    xarelto 20mg  daily for 30 days   and Jardiance 10mg /d. For 30 days   For apixaban and , xarelto NCM can provide 30 day free card and co pay cards  RN

## 2019-10-01 NOTE — Progress Notes (Signed)
PT Progress Note for Charges    10/01/19 1700  PT Visit Information  Last PT Received On 10/01/19  PT General Charges  $$ ACUTE PT VISIT 1 Visit  PT Treatments  $Gait Training 8-22 mins  Arletta Bale, DPT  Acute Rehabilitation Services Pager [phone] Office [phone]
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
## 2019-10-01 NOTE — Progress Notes (Signed)
ANTICOAGULATION CONSULT NOTE   Pharmacy Consult for Heparin Indication: Warfarin on hold - > cath planned  Patient Measurements: Height: 5\' 3"  (160 cm) Weight: 74.5 kg (164 lb 3.9 oz) IBW/kg (Calculated) : 56.9  Vital Signs: Temp: 98.1 F (36.7 C) (04/28 0914) Temp Source: Oral (04/28 0914) BP: 136/93 (04/28 0914) Pulse Rate: 106 (04/28 0914)  Labs: Recent Labs    09/29/19 0809 09/29/19 0809 09/29/19 0849 09/29/19 0849 09/29/19 0947 09/29/19 1034 09/30/19 0248 09/30/19 1801 10/01/19 0118 10/01/19 0528  HGB 12.8*   < > 14.3   < >  --  12.9* 12.7*  --   --   --   HCT 43.6   < > 42.0  --   --  38.0* 39.4  --   --   --   PLT 215  --   --   --   --   --  221  --   --   --   LABPROT 24.0*  --   --   --   --   --  24.2*  --  23.1*  --   INR 2.2*  --   --   --   --   --  2.2*  --  2.1*  --   CREATININE 1.26*  --  1.00  --   --   --  1.18  --   --   --   TROPONINIHS 57*   < >  --   --  889*  --   --  12,610*  --  7,400*   < > = values in this interval not displayed.    Estimated Creatinine Clearance: 57.2 mL/min (by C-G formula based on SCr of 1.18 mg/dL).   Medical History: Past Medical History:  Diagnosis Date  . CAD (coronary artery disease) of artery bypass graft    s/p CABG x 4 in 1997  . COPD (chronic obstructive pulmonary disease) (HCC)   . CVA (cerebral vascular accident) (HCC)   . Diabetes mellitus without complication (HCC)   . HLD (hyperlipidemia)   . Hypertension   . Seizures [facility])    Assessment: 64 year old male to begin heparin if needed prior to planned cath when INR < 2, INR today 2.1  On warfarin prior to admission for history of CVA / PE  Goal of Therapy:  Heparin level 0.3-0.7 units/ml Monitor platelets by anticoagulation protocol: Yes   Plan:  Heparin when INR < 2 INR in AM  Thank you 77, PharmD  10/01/2019,10:03 AM

## 2019-10-01 NOTE — TOC Benefit Eligibility Note (Signed)
Transition of Care [facility]) Benefit Eligibility Note    Patient Details  Name: TERYL MCCONAGHY MRN: [id number] Date of Birth: 05/25/1956   Medication/Dose: ELIQUIS  5 MG BID  CO-PAY- $100.00       XARELTO 20 MG DAILY CO-PAY-  $100.00      JARDIANCE 10MG  DAILY  CO-PAY- $100.00  Covered?: Yes  Tier: (PREFERRED)  Prescription Coverage Preferred Pharmacy: with Person/Company/Phone Number:: JESSICA  @ [facility] RX # [phone] OPT- MEMBER  Co-Pay: $100.00  Prior Approval: No  Deductible: Unmet(OUT-OF-POCKET: UNMET)  Additional Notes: [employer] : [phone] Phone Number: 10/01/2019, 1:23 PM
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
## 2019-10-02 ENCOUNTER — Encounter (HOSPITAL_COMMUNITY)
Admission: EM | Disposition: A | Discharge status: Home Health | Payer: Self-pay | Source: Home / Self Care | Attending: Internal Medicine

## 2019-10-02 ENCOUNTER — Encounter (HOSPITAL_COMMUNITY): Payer: Self-pay | Admitting: Internal Medicine

## 2019-10-02 ENCOUNTER — Encounter: Payer: 59 | Admitting: Occupational Therapy

## 2019-10-02 DIAGNOSIS — R4020 Unspecified coma: Secondary | ICD-10-CM | POA: Diagnosis not present

## 2019-10-02 DIAGNOSIS — Z7901 Long term (current) use of anticoagulants: Secondary | ICD-10-CM

## 2019-10-02 DIAGNOSIS — N289 Disorder of kidney and ureter, unspecified: Secondary | ICD-10-CM

## 2019-10-02 DIAGNOSIS — I214 Non-ST elevation (NSTEMI) myocardial infarction: Secondary | ICD-10-CM

## 2019-10-02 DIAGNOSIS — R778 Other specified abnormalities of plasma proteins: Secondary | ICD-10-CM | POA: Diagnosis not present

## 2019-10-02 DIAGNOSIS — I2510 Atherosclerotic heart disease of native coronary artery without angina pectoris: Secondary | ICD-10-CM | POA: Diagnosis not present

## 2019-10-02 DIAGNOSIS — I25810 Atherosclerosis of coronary artery bypass graft(s) without angina pectoris: Secondary | ICD-10-CM

## 2019-10-02 DIAGNOSIS — Z5181 Encounter for therapeutic drug level monitoring: Secondary | ICD-10-CM

## 2019-10-02 DIAGNOSIS — R000 Tachycardia, unspecified: Secondary | ICD-10-CM | POA: Diagnosis not present

## 2019-10-02 DIAGNOSIS — R7989 Other specified abnormal findings of blood chemistry: Secondary | ICD-10-CM

## 2019-10-02 HISTORY — PX: LEFT HEART CATH AND CORS/GRAFTS ANGIOGRAPHY: CATH118250

## 2019-10-02 LAB — BASIC METABOLIC PANEL
Anion gap: 14 (ref 5–15)
Anion gap: 15 (ref 5–15)
BUN: 27 mg/dL — ABNORMAL HIGH (ref 8–23)
BUN: 21 mg/dL (ref 8–23)
CO2: 24 mmol/L (ref 22–32)
CO2: 22 mmol/L (ref 22–32)
Calcium: 8.9 mg/dL (ref 8.9–10.3)
Calcium: 9 mg/dL (ref 8.9–10.3)
Chloride: 104 mmol/L (ref 98–111)
Chloride: 104 mmol/L (ref 98–111)
Creatinine, Ser: 1.59 mg/dL — ABNORMAL HIGH (ref 0.61–1.24)
Creatinine, Ser: 1.24 mg/dL (ref 0.61–1.24)
GFR calc Af Amer: 52 mL/min — ABNORMAL LOW (ref >60)
GFR calc Af Amer: >60 mL/min (ref >60)
GFR calc non Af Amer: 45 mL/min — ABNORMAL LOW (ref >60)
GFR calc non Af Amer: >60 mL/min (ref >60)
Glucose, Bld: 154 mg/dL — ABNORMAL HIGH (ref 70–99)
Glucose, Bld: 113 mg/dL — ABNORMAL HIGH (ref 70–99)
Potassium: 4.2 mmol/L (ref 3.5–5.1)
Potassium: 4.1 mmol/L (ref 3.5–5.1)
Sodium: 142 mmol/L (ref 135–145)
Sodium: 141 mmol/L (ref 135–145)

## 2019-10-02 LAB — CBC
HCT: 41.1 % (ref 39.0–52.0)
Hemoglobin: 13 g/dL (ref 13.0–17.0)
MCH: 28.8 pg (ref 26.0–34.0)
MCHC: 31.6 g/dL (ref 30.0–36.0)
MCV: 90.9 fL (ref 80.0–100.0)
Platelets: 204 10*3/uL (ref 150–400)
RBC: 4.52 MIL/uL (ref 4.22–5.81)
RDW: 14 % (ref 11.5–15.5)
WBC: 6.5 10*3/uL (ref 4.0–10.5)
nRBC: 0 % (ref 0.0–0.2)

## 2019-10-02 LAB — PROTIME-INR
INR: 1.7 — ABNORMAL HIGH (ref 0.8–1.2)
Prothrombin Time: 19.7 seconds — ABNORMAL HIGH (ref 11.4–15.2)

## 2019-10-02 LAB — GLUCOSE, CAPILLARY
Glucose-Capillary: 155 mg/dL — ABNORMAL HIGH (ref 70–99)
Glucose-Capillary: 124 mg/dL — ABNORMAL HIGH (ref 70–99)
Glucose-Capillary: 103 mg/dL — ABNORMAL HIGH (ref 70–99)
Glucose-Capillary: 274 mg/dL — ABNORMAL HIGH (ref 70–99)

## 2019-10-02 SURGERY — LEFT HEART CATH AND CORS/GRAFTS ANGIOGRAPHY
Anesthesia: LOCAL

## 2019-10-02 MED ORDER — HYDRALAZINE HCL 20 MG/ML IJ SOLN: 10.0000 mg | INTRAMUSCULAR | Status: AC | PRN

## 2019-10-02 MED ORDER — SODIUM CHLORIDE 0.9 % WEIGHT BASED INFUSION
3.0000 mL/kg/h | INTRAVENOUS | Status: DC
Start: 2019-10-03 — End: 2019-10-02

## 2019-10-02 MED ORDER — SODIUM CHLORIDE 0.9 % IV SOLN: INTRAVENOUS | Status: AC

## 2019-10-02 MED ORDER — SODIUM CHLORIDE 0.9% FLUSH
3.0000 mL | Freq: Two times a day (BID) | INTRAVENOUS | Status: DC
  Administered 2019-10-03 – 2019-10-08 (×7): 3 mL via INTRAVENOUS

## 2019-10-02 MED ORDER — HEPARIN SODIUM (PORCINE) 1000 UNIT/ML IJ SOLN
INTRAMUSCULAR | Status: AC
  Filled 2019-10-02: qty 1

## 2019-10-02 MED ORDER — SODIUM CHLORIDE 0.9 % IV SOLN: 250.0000 mL | INTRAVENOUS | Status: DC | PRN

## 2019-10-02 MED ORDER — HEPARIN (PORCINE) 25000 UT/250ML-% IV SOLN
900.0000 [IU]/h | INTRAVENOUS | Status: DC
  Administered 2019-10-02: 900 [IU]/h via INTRAVENOUS
  Filled 2019-10-02: qty 250

## 2019-10-02 MED ORDER — SODIUM CHLORIDE 0.9% FLUSH: 3.0000 mL | INTRAVENOUS | Status: DC | PRN

## 2019-10-02 MED ORDER — HEPARIN (PORCINE) IN NACL 1000-0.9 UT/500ML-% IV SOLN
INTRAVENOUS | Status: AC
  Filled 2019-10-02: qty 500

## 2019-10-02 MED ORDER — MIDAZOLAM HCL 2 MG/2ML IJ SOLN
INTRAMUSCULAR | Status: DC | PRN
  Administered 2019-10-02: 1 mg via INTRAVENOUS

## 2019-10-02 MED ORDER — SODIUM CHLORIDE 0.9 % WEIGHT BASED INFUSION
1.0000 mL/kg/h | INTRAVENOUS | Status: DC
  Administered 2019-10-02 (×2): 1 mL/kg/h via INTRAVENOUS

## 2019-10-02 MED ORDER — SODIUM CHLORIDE 0.9 % WEIGHT BASED INFUSION: 1.0000 mL/kg/h | INTRAVENOUS | Status: DC

## 2019-10-02 MED ORDER — FENTANYL CITRATE (PF) 100 MCG/2ML IJ SOLN
INTRAMUSCULAR | Status: AC
  Filled 2019-10-02: qty 2

## 2019-10-02 MED ORDER — MIDAZOLAM HCL 2 MG/2ML IJ SOLN
INTRAMUSCULAR | Status: AC
  Filled 2019-10-02: qty 2

## 2019-10-02 MED ORDER — HEPARIN SODIUM (PORCINE) 5000 UNIT/ML IJ SOLN
5000.0000 [IU] | Freq: Three times a day (TID) | INTRAMUSCULAR | Status: DC
  Administered 2019-10-02 – 2019-10-03 (×2): 5000 [IU] via SUBCUTANEOUS
  Filled 2019-10-02 (×2): qty 1

## 2019-10-02 MED ORDER — HEPARIN (PORCINE) IN NACL 1000-0.9 UT/500ML-% IV SOLN
INTRAVENOUS | Status: DC | PRN
  Administered 2019-10-02 (×2): 500 mL

## 2019-10-02 MED ORDER — SODIUM CHLORIDE 0.9 % WEIGHT BASED INFUSION
3.0000 mL/kg/h | INTRAVENOUS | Status: DC
  Administered 2019-10-02: 3 mL/kg/h via INTRAVENOUS

## 2019-10-02 MED ORDER — IOHEXOL 350 MG/ML SOLN
INTRAVENOUS | Status: DC | PRN
  Administered 2019-10-02: 16:00:00 75 mL via INTRA_ARTERIAL

## 2019-10-02 MED ORDER — IOHEXOL 350 MG/ML SOLN
INTRAVENOUS | Status: AC
  Filled 2019-10-02: qty 1

## 2019-10-02 MED ORDER — VERAPAMIL HCL 2.5 MG/ML IV SOLN
INTRAVENOUS | Status: AC
  Filled 2019-10-02: qty 2

## 2019-10-02 MED ORDER — ONDANSETRON HCL 4 MG/2ML IJ SOLN: 4.0000 mg | Freq: Four times a day (QID) | INTRAMUSCULAR | Status: DC | PRN

## 2019-10-02 MED ORDER — SODIUM CHLORIDE 0.9% FLUSH: 3.0000 mL | Freq: Two times a day (BID) | INTRAVENOUS | Status: DC

## 2019-10-02 MED ORDER — LIDOCAINE HCL (PF) 1 % IJ SOLN
INTRAMUSCULAR | Status: DC | PRN
  Administered 2019-10-02: 20 mL

## 2019-10-02 MED ORDER — FENTANYL CITRATE (PF) 100 MCG/2ML IJ SOLN
INTRAMUSCULAR | Status: DC | PRN
  Administered 2019-10-02: 25 ug via INTRAVENOUS

## 2019-10-02 MED ORDER — LABETALOL HCL 5 MG/ML IV SOLN: 10.0000 mg | INTRAVENOUS | Status: AC | PRN

## 2019-10-02 SURGICAL SUPPLY — 9 items
CATH INFINITI 5FR MULTPACK ANG (CATHETERS) ×1 IMPLANT
CLOSURE MYNX CONTROL 5F (Vascular Products) ×1 IMPLANT
KIT HEART LEFT (KITS) ×2 IMPLANT
PACK CARDIAC CATHETERIZATION (CUSTOM PROCEDURE TRAY) ×2 IMPLANT
SHEATH PINNACLE 5F 10CM (SHEATH) ×1 IMPLANT
SHEATH PROBE COVER 6X72 (BAG) ×2 IMPLANT
TRANSDUCER W/STOPCOCK (MISCELLANEOUS) ×2 IMPLANT
TUBING CIL FLEX 10 FLL-RA (TUBING) ×2 IMPLANT
WIRE EMERALD 3MM-J .035X150CM (WIRE) ×1 IMPLANT
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
## 2019-10-02 NOTE — Interval H&P Note (Signed)
History and Physical Interval Note:  10/02/2019 3:14 PM  DOCK Thomas Moore  has presented today for surgery, with the diagnosis of nstemi.   The various methods of treatment have been discussed with the patient and family. After consideration of risks, benefits and other options for treatment, the patient has consented to  Procedure(s): LEFT HEART CATH AND CORS/GRAFTS ANGIOGRAPHY (N/A)  PERCUTANEOUS CORONARY INTERVENTION .  as a surgical intervention.    The patient's history has been reviewed, patient examined, no change in status, stable for surgery.  I have reviewed the patient's chart and labs.  Unfortunately, despite the patient having had bypass surgery in 1997 with listed coronary anomaly and 20 bypass x4, there is no note that tells me grafts were the baseline anatomy that is the anomaly.  Patient is being referred for a cardiac catheterization, it was delayed due to slight increase in his creatinine.  Repeat creatinine shows improvement from 1.59-1.24.    Questions were answered to the patient's satisfaction.    Cath Lab Visit (complete for each Cath Lab visit)  Clinical Evaluation Leading to the Procedure:   ACS: Yes.    Non-ACS:    Anginal Classification: CCS II  Anti-ischemic medical therapy: Minimal Therapy (1 class of medications)  Non-Invasive Test Results: No non-invasive testing performed  Prior CABG: Previous CABG   Thomas Moore

## 2019-10-02 NOTE — Progress Notes (Addendum)
ANTICOAGULATION CONSULT NOTE   Pharmacy Consult for Heparin Indication: Warfarin on hold - > cath planned  Patient Measurements: Height: 5\' 3"  (160 cm) Weight: 73.4 kg (161 lb 13.1 oz) IBW/kg (Calculated) : 56.9  Vital Signs: Temp: 97.4 F (36.3 C) (04/29 0502) Temp Source: Oral (04/29 0502) BP: 130/80 (04/29 0502) Pulse Rate: 78 (04/29 0502)  Labs: Recent Labs    09/29/19 0809 09/29/19 0809 09/29/19 0849 09/29/19 0849 09/29/19 0947 09/29/19 1034 09/30/19 0248 09/30/19 1801 10/01/19 0118 10/01/19 0528 10/02/19 0206  HGB 12.8*   < > 14.3   < >  --  12.9* 12.7*  --   --   --  13.0  HCT 43.6   < > 42.0   < >  --  38.0* 39.4  --   --   --  41.1  PLT 215  --   --   --   --   --  221  --   --   --  204  LABPROT 24.0*   < >  --   --   --   --  24.2*  --  23.1*  --  19.7*  INR 2.2*   < >  --   --   --   --  2.2*  --  2.1*  --  1.7*  CREATININE 1.26*   < > 1.00  --   --   --  1.18  --   --   --  1.59*  TROPONINIHS 57*   < >  --   --  889*  --   --  12,610*  --  7,400*  --    < > = values in this interval not displayed.    Estimated Creatinine Clearance: 42.2 mL/min (A) (by C-G formula based on SCr of 1.59 mg/dL (H)).   Medical History: Past Medical History:  Diagnosis Date  . CAD (coronary artery disease) of artery bypass graft    s/p CABG x 4 in 1997  . COPD (chronic obstructive pulmonary disease) (HCC)   . CVA (cerebral vascular accident) (HCC)   . Diabetes mellitus without complication (HCC)   . HLD (hyperlipidemia)   . Hypertension   . Seizures [facility])    Assessment: 64 year old male to begin heparin if needed prior to planned cath when INR < 2. On warfarin prior to admission for history of CVA / PE. INR on admission was 2.2.  INR now today is 1.7. Hgb 13, plt 204. No s/sx of bleeding noted.    PTA regimen is 7 mg daily (LD 4/25).  Goal of Therapy:  Heparin level 0.3-0.7 units/ml Monitor platelets by anticoagulation protocol: Yes   Plan:  Hold warfarin  prior to cath Will start heparin at 900 units/hr  Order level in 6 hours if no cath before that Monitor daily HL, CBC, and for s/sx of bleeding F/u timing of cath and timing of warfarin restart after  Thank you 5/25, PharmD, BCCCP Clinical Pharmacist  Phone: [phone] 10/02/2019 7:41 AM  Please check AMION for all [facility] phone numbers After 10:00 PM, call Main Pharmacy [phone]  [facility] Cardiology okay with holding on heparin infusion and plan for cath today.   [facility], PharmD, BCCCP Clinical Pharmacist

## 2019-10-03 DIAGNOSIS — R778 Other specified abnormalities of plasma proteins: Secondary | ICD-10-CM | POA: Diagnosis not present

## 2019-10-03 DIAGNOSIS — R000 Tachycardia, unspecified: Secondary | ICD-10-CM | POA: Diagnosis not present

## 2019-10-03 DIAGNOSIS — I2510 Atherosclerotic heart disease of native coronary artery without angina pectoris: Secondary | ICD-10-CM | POA: Diagnosis not present

## 2019-10-03 DIAGNOSIS — I214 Non-ST elevation (NSTEMI) myocardial infarction: Secondary | ICD-10-CM | POA: Diagnosis not present

## 2019-10-03 DIAGNOSIS — I471 Supraventricular tachycardia, unspecified: Secondary | ICD-10-CM

## 2019-10-03 LAB — BASIC METABOLIC PANEL
Anion gap: 12 (ref 5–15)
BUN: 21 mg/dL (ref 8–23)
CO2: 23 mmol/L (ref 22–32)
Calcium: 8.7 mg/dL — ABNORMAL LOW (ref 8.9–10.3)
Chloride: 106 mmol/L (ref 98–111)
Creatinine, Ser: 1.15 mg/dL (ref 0.61–1.24)
GFR calc Af Amer: >60 mL/min (ref >60)
GFR calc non Af Amer: >60 mL/min (ref >60)
Glucose, Bld: 107 mg/dL — ABNORMAL HIGH (ref 70–99)
Potassium: 4.3 mmol/L (ref 3.5–5.1)
Sodium: 141 mmol/L (ref 135–145)

## 2019-10-03 LAB — GLUCOSE, CAPILLARY
Glucose-Capillary: 156 mg/dL — ABNORMAL HIGH (ref 70–99)
Glucose-Capillary: 189 mg/dL — ABNORMAL HIGH (ref 70–99)
Glucose-Capillary: 199 mg/dL — ABNORMAL HIGH (ref 70–99)
Glucose-Capillary: 202 mg/dL — ABNORMAL HIGH (ref 70–99)

## 2019-10-03 LAB — PROTIME-INR
INR: 1.4 — ABNORMAL HIGH (ref 0.8–1.2)
Prothrombin Time: 16.9 seconds — ABNORMAL HIGH (ref 11.4–15.2)

## 2019-10-03 LAB — TSH: TSH: 3.637 u[IU]/mL (ref 0.350–4.500)

## 2019-10-03 MED ORDER — METOPROLOL TARTRATE 100 MG PO TABS
100.0000 mg | ORAL_TABLET | Freq: Two times a day (BID) | ORAL | Status: DC
  Administered 2019-10-03 – 2019-10-09 (×13): 100 mg via ORAL
  Filled 2019-10-03 (×13): qty 1

## 2019-10-03 MED ORDER — ENOXAPARIN SODIUM 80 MG/0.8ML ~~LOC~~ SOLN
70.0000 mg | Freq: Once | SUBCUTANEOUS | Status: AC
  Administered 2019-10-03: 70 mg via SUBCUTANEOUS
  Filled 2019-10-03: qty 0.8

## 2019-10-03 MED ORDER — ENOXAPARIN SODIUM 80 MG/0.8ML ~~LOC~~ SOLN
70.0000 mg | Freq: Two times a day (BID) | SUBCUTANEOUS | Status: DC
  Administered 2019-10-03 – 2019-10-05 (×5): 70 mg via SUBCUTANEOUS
  Filled 2019-10-03 (×5): qty 0.8

## 2019-10-03 MED ORDER — WARFARIN - PHARMACIST DOSING INPATIENT: Freq: Every day | Status: DC

## 2019-10-03 MED ORDER — WARFARIN SODIUM 10 MG PO TABS
10.0000 mg | ORAL_TABLET | Freq: Once | ORAL | Status: AC
  Administered 2019-10-03: 10 mg via ORAL
  Filled 2019-10-03: qty 1

## 2019-10-03 NOTE — TOC Benefit Eligibility Note (Signed)
Transition of Care [facility]) Benefit Eligibility Note    Patient Details  Name: Thomas Moore MRN: [id number] Date of Birth: [birth year]   Medication/Dose: ENOXAPRIN 38 MG SQ BID  Covered?: Yes  Tier: (PREFERRED)  Prescription Coverage [facility]  Spoke with Person/Company/Phone Number:: RAJUL   @ [facility] [employer] # [phone] [employer]- [employer]  Co-Pay: $ 10.00  Prior Approval: No  Deductible: Met  Additional Notes: [employer] : PRIOR APPROVAL- YES # [phone]    Memory Argue Phone Number: 10/03/2019, 1:23 PM
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
## 2019-10-03 NOTE — Progress Notes (Addendum)
Called by nurse for episode of tachycardia with HR 140s earlier, short lived, appears to a recurrent SVT on telemetry. No CP or SOB with this. Now in NSR. VSS. Per chart review this has been occurring during hospitalization. Will titrate metoprolol to 100mg  BID to give first dose now. Add TSH to labs. Elgin Carn PA-C

## 2019-10-03 NOTE — Progress Notes (Signed)
Patient had episode of tachycardia HR up into the 140s while laying in bed.  Patient denied sob or pain of any kind.  I have notified MD on call regarding this episode.
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
## 2019-10-03 NOTE — Progress Notes (Addendum)
ANTICOAGULATION CONSULT NOTE - Initial Consult  Pharmacy Consult for warfarin, lovenox Indication: history VTE  Not on File  Patient Measurements: Height: 5\' 3"  (160 cm) Weight: 73.2 kg (161 lb 6.4 oz) IBW/kg (Calculated) : 56.9   Vital Signs: Temp: 97.8 F (36.6 C) (04/30 0345) Temp Source: Oral (04/30 0345) BP: 143/78 (04/30 0645) Pulse Rate: 87 (04/30 0645)  Labs: Recent Labs    09/30/19 1801 10/01/19 0118 10/01/19 0528 10/02/19 0206 10/02/19 1404 10/03/19 0323  HGB  --   --   --  13.0  --   --   HCT  --   --   --  41.1  --   --   PLT  --   --   --  204  --   --   LABPROT  --  23.1*  --  19.7*  --  16.9*  INR  --  2.1*  --  1.7*  --  1.4*  CREATININE  --   --   --  1.59* 1.24 1.15  TROPONINIHS 12,610*  --  7,400*  --   --   --     Estimated Creatinine Clearance: 58.2 mL/min (by C-G formula based on SCr of 1.15 mg/dL).   Medical History: Past Medical History:  Diagnosis Date  . CAD (coronary artery disease) of artery bypass graft    s/p CABG x 4 in 1997  . COPD (chronic obstructive pulmonary disease) (HCC)   . CVA (cerebral vascular accident) (HCC)   . Diabetes mellitus without complication (HCC)   . HLD (hyperlipidemia)   . Hypertension   . Seizures (HCC)     Medications:  Medications Prior to Admission  Medication Sig Dispense Refill Last Dose  . aspirin EC 81 MG tablet Take 81 mg by mouth daily.   09/28/2019 at Unknown time  . atorvastatin (LIPITOR) 40 MG tablet Take 40 mg by mouth at bedtime.   09/28/2019 at Unknown time  . calcium citrate-vitamin D (CITRACAL+D) 315-200 MG-UNIT tablet Take 1 tablet by mouth 2 (two) times daily.   09/28/2019 at Unknown time  . divalproex (DEPAKOTE) 250 MG DR tablet Take 250 mg by mouth 2 (two) times daily.   09/28/2019 at Unknown time  . glipiZIDE (GLUCOTROL XL) 10 MG 24 hr tablet Take 10 mg by mouth 2 (two) times daily.   09/28/2019 at Unknown time  . metFORMIN (GLUCOPHAGE-XR) 500 MG 24 hr tablet Take 1,000 mg by mouth 2  (two) times daily.   09/28/2019 at Unknown time  . metoprolol succinate (TOPROL-XL) 100 MG 24 hr tablet Take 100 mg by mouth daily.   09/28/2019 at 0900  . Multiple Vitamin (MULTIVITAMIN) capsule Take 1 capsule by mouth daily.   09/28/2019 at Unknown time  . omeprazole (PRILOSEC) 40 MG capsule Take 40 mg by mouth daily.   09/28/2019 at Unknown time  . ramipril (ALTACE) 10 MG capsule Take 10 mg by mouth daily.   09/28/2019 at Unknown time  . TRULICITY 0.75 MG/0.5ML SOPN Inject 0.5 mLs into the skin once a week. Wednesday   09/24/2019  . warfarin (COUMADIN) 1 MG tablet Take 1 mg by mouth daily.   09/28/2019 at 2000  . warfarin (COUMADIN) 6 MG tablet Take 6 mg by mouth daily. Taking with 1mg  = 7mg  total daily   09/28/2019 at 2000    Assessment: 64 yo male on warfarin PTA for history of recurrent VTE.  Warfarin has been on for cath . Pharmacy to restart warfarin with lovenox bridge -  SCr= 1.1  Goal of Therapy:  INR 2-3 Monitor platelets by anticoagulation protocol: Yes   Plan:  -warfarin 10mg  po x1 -Lovenox 70mg  [place] bid -Daily PT/INR -CBC every 3 days while inpatient  Hildred Laser, PharmD Clinical Pharmacist **Pharmacist phone directory can now be found on [email or web address] ([email or web address]).  Listed under [place].

## 2019-10-03 NOTE — Care Management (Addendum)
1231 10-03-19 Benefits Check submitted for lovenox (80mg  sq every 12h). Case Manager will follow for cost. [name], RN,BSN Case Manager     10-03-19 1440 Case Manager spoke with patient regarding home needs. Patient continues to decline   home health PT and 3n1. Patient lives in an apartment and has to go up flights of stairs. Patient will need Physical Therapy to work with him on the stairs prior to transition home. Staff RN to send PT a message to work with patient on the steps for home. Will need transportation home-weekend Case Manager to assist.  Thomas Moore, 04-12-1989, RN,BSN Case Manager

## 2019-10-04 DIAGNOSIS — E785 Hyperlipidemia, unspecified: Secondary | ICD-10-CM

## 2019-10-04 DIAGNOSIS — E119 Type 2 diabetes mellitus without complications: Secondary | ICD-10-CM

## 2019-10-04 DIAGNOSIS — I10 Essential (primary) hypertension: Secondary | ICD-10-CM

## 2019-10-04 DIAGNOSIS — G40909 Epilepsy, unspecified, not intractable, without status epilepticus: Principal | ICD-10-CM

## 2019-10-04 DIAGNOSIS — Z86718 Personal history of other venous thrombosis and embolism: Secondary | ICD-10-CM

## 2019-10-04 DIAGNOSIS — D688 Other specified coagulation defects: Secondary | ICD-10-CM

## 2019-10-04 DIAGNOSIS — Z8673 Personal history of transient ischemic attack (TIA), and cerebral infarction without residual deficits: Secondary | ICD-10-CM

## 2019-10-04 DIAGNOSIS — Z951 Presence of aortocoronary bypass graft: Secondary | ICD-10-CM

## 2019-10-04 LAB — GLUCOSE, CAPILLARY
Glucose-Capillary: 173 mg/dL — ABNORMAL HIGH (ref 70–99)
Glucose-Capillary: 308 mg/dL — ABNORMAL HIGH (ref 70–99)
Glucose-Capillary: 216 mg/dL — ABNORMAL HIGH (ref 70–99)
Glucose-Capillary: 284 mg/dL — ABNORMAL HIGH (ref 70–99)

## 2019-10-04 LAB — PROTIME-INR
INR: 1.4 — ABNORMAL HIGH (ref 0.8–1.2)
Prothrombin Time: 16.4 seconds — ABNORMAL HIGH (ref 11.4–15.2)

## 2019-10-04 MED ORDER — WARFARIN SODIUM 7.5 MG PO TABS
7.5000 mg | ORAL_TABLET | Freq: Once | ORAL | Status: AC
  Administered 2019-10-04: 18:00:00 7.5 mg via ORAL
  Filled 2019-10-04: qty 1
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
## 2019-10-04 NOTE — Progress Notes (Signed)
ANTICOAGULATION CONSULT NOTE  Pharmacy Consult for warfarin, lovenox Indication: history VTE  Not on File  Patient Measurements: Height: 5\' 3"  (160 cm) Weight: 72.9 kg (160 lb 12.8 oz) IBW/kg (Calculated) : 56.9   Vital Signs: Temp: 98.4 F (36.9 C) (05/01 0327) Temp Source: Oral (05/01 0327) BP: 143/84 (05/01 0327) Pulse Rate: 67 (05/01 0327)  Labs: Recent Labs    10/02/19 0206 10/02/19 1404 10/03/19 0323 10/04/19 0315  HGB 13.0  --   --   --   HCT 41.1  --   --   --   PLT 204  --   --   --   LABPROT 19.7*  --  16.9* 16.4*  INR 1.7*  --  1.4* 1.4*  CREATININE 1.59* 1.24 1.15  --     Estimated Creatinine Clearance: 58.1 mL/min (by C-G formula based on SCr of 1.15 mg/dL).   Medical History: Past Medical History:  Diagnosis Date  . CAD (coronary artery disease) of artery bypass graft    s/p CABG x 4 in 1997  . COPD (chronic obstructive pulmonary disease) (HCC)   . CVA (cerebral vascular accident) (HCC)   . Diabetes mellitus without complication (HCC)   . HLD (hyperlipidemia)   . Hypertension   . Seizures (HCC)     Medications:  Medications Prior to Admission  Medication Sig Dispense Refill Last Dose  . aspirin EC 81 MG tablet Take 81 mg by mouth daily.   09/28/2019 at Unknown time  . atorvastatin (LIPITOR) 40 MG tablet Take 40 mg by mouth at bedtime.   09/28/2019 at Unknown time  . calcium citrate-vitamin D (CITRACAL+D) 315-200 MG-UNIT tablet Take 1 tablet by mouth 2 (two) times daily.   09/28/2019 at Unknown time  . divalproex (DEPAKOTE) 250 MG DR tablet Take 250 mg by mouth 2 (two) times daily.   09/28/2019 at Unknown time  . glipiZIDE (GLUCOTROL XL) 10 MG 24 hr tablet Take 10 mg by mouth 2 (two) times daily.   09/28/2019 at Unknown time  . metFORMIN (GLUCOPHAGE-XR) 500 MG 24 hr tablet Take 1,000 mg by mouth 2 (two) times daily.   09/28/2019 at Unknown time  . metoprolol succinate (TOPROL-XL) 100 MG 24 hr tablet Take 100 mg by mouth daily.   09/28/2019 at 0900  .  Multiple Vitamin (MULTIVITAMIN) capsule Take 1 capsule by mouth daily.   09/28/2019 at Unknown time  . omeprazole (PRILOSEC) 40 MG capsule Take 40 mg by mouth daily.   09/28/2019 at Unknown time  . ramipril (ALTACE) 10 MG capsule Take 10 mg by mouth daily.   09/28/2019 at Unknown time  . TRULICITY 0.75 MG/0.5ML SOPN Inject 0.5 mLs into the skin once a week. Wednesday   09/24/2019  . warfarin (COUMADIN) 1 MG tablet Take 1 mg by mouth daily.   09/28/2019 at 2000  . warfarin (COUMADIN) 6 MG tablet Take 6 mg by mouth daily. Taking with 1mg  = 7mg  total daily   09/28/2019 at 2000    Assessment: 64 yo male on warfarin PTA for history of recurrent VTE.  Warfarin has been on for cath . Pharmacy to restart warfarin with lovenox bridge  INR 1.4 today -stable from yesterday. CBC on last check 4/29 stable at Hgb 13, plt 204. No s/sx of bleeding documented. Scr stable at 1.15 (CrCl 58 mL/min).   PTA regimen is 7 mg daily (LD 4/25).   Goal of Therapy:  INR 2-3 Monitor platelets by anticoagulation protocol: Yes   Plan:  -Order  warfarin 7.5 mg tonight  -Continue Lovenox 70mg  [place] bid -Daily PT/INR -CBC every 3 days while inpatient  Antonietta Jewel, PharmD, [place] Clinical Pharmacist  Phone: [phone] 10/04/2019 9:15 AM  Please check AMION for all [place] phone numbers After 10:00 PM, call [place] [phone]

## 2019-10-05 LAB — BASIC METABOLIC PANEL
Anion gap: 10 (ref 5–15)
BUN: 19 mg/dL (ref 8–23)
CO2: 26 mmol/L (ref 22–32)
Calcium: 8.9 mg/dL (ref 8.9–10.3)
Chloride: 106 mmol/L (ref 98–111)
Creatinine, Ser: 1.35 mg/dL — ABNORMAL HIGH (ref 0.61–1.24)
GFR calc Af Amer: >60 mL/min (ref >60)
GFR calc non Af Amer: 55 mL/min — ABNORMAL LOW (ref >60)
Glucose, Bld: 183 mg/dL — ABNORMAL HIGH (ref 70–99)
Potassium: 4.7 mmol/L (ref 3.5–5.1)
Sodium: 142 mmol/L (ref 135–145)

## 2019-10-05 LAB — GLUCOSE, CAPILLARY
Glucose-Capillary: 208 mg/dL — ABNORMAL HIGH (ref 70–99)
Glucose-Capillary: 284 mg/dL — ABNORMAL HIGH (ref 70–99)
Glucose-Capillary: 164 mg/dL — ABNORMAL HIGH (ref 70–99)
Glucose-Capillary: 289 mg/dL — ABNORMAL HIGH (ref 70–99)

## 2019-10-05 LAB — CBC
HCT: 40.6 % (ref 39.0–52.0)
Hemoglobin: 12.8 g/dL — ABNORMAL LOW (ref 13.0–17.0)
MCH: 28.6 pg (ref 26.0–34.0)
MCHC: 31.5 g/dL (ref 30.0–36.0)
MCV: 90.6 fL (ref 80.0–100.0)
Platelets: 199 10*3/uL (ref 150–400)
RBC: 4.48 MIL/uL (ref 4.22–5.81)
RDW: 14 % (ref 11.5–15.5)
WBC: 6.4 10*3/uL (ref 4.0–10.5)
nRBC: 0 % (ref 0.0–0.2)

## 2019-10-05 LAB — PROTIME-INR
INR: 2.3 — ABNORMAL HIGH (ref 0.8–1.2)
Prothrombin Time: 24.1 seconds — ABNORMAL HIGH (ref 11.4–15.2)

## 2019-10-05 MED ORDER — WARFARIN SODIUM 1 MG PO TABS
1.0000 mg | ORAL_TABLET | Freq: Once | ORAL | Status: AC
  Administered 2019-10-05: 18:00:00 1 mg via ORAL
  Filled 2019-10-05: qty 1

## 2019-10-05 MED ORDER — SODIUM CHLORIDE 0.9 % IV BOLUS: 500.0000 mL | Freq: Once | INTRAVENOUS | Status: DC
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
## 2019-10-05 NOTE — Progress Notes (Signed)
ANTICOAGULATION CONSULT NOTE  Pharmacy Consult for warfarin, lovenox Indication: history VTE  Not on File  Patient Measurements: Height: 5\' 3"  (160 cm) Weight: 76 kg (167 lb 8.8 oz) IBW/kg (Calculated) : 56.9   Vital Signs: Temp: 98.2 F (36.8 C) (05/02 0444) Temp Source: Oral (05/02 0444) BP: 125/68 (05/02 0444) Pulse Rate: 80 (05/02 0444)  Labs: Recent Labs    10/02/19 1404 10/03/19 0323 10/04/19 0315 10/05/19 0436  HGB  --   --   --  12.8*  HCT  --   --   --  40.6  PLT  --   --   --  199  LABPROT  --  16.9* 16.4* 24.1*  INR  --  1.4* 1.4* 2.3*  CREATININE 1.24 1.15  --  1.35*    Estimated Creatinine Clearance: 50.4 mL/min (A) (by C-G formula based on SCr of 1.35 mg/dL (H)).   Medical History: Past Medical History:  Diagnosis Date  . CAD (coronary artery disease) of artery bypass graft    s/p CABG x 4 in 1997  . COPD (chronic obstructive pulmonary disease) (HCC)   . CVA (cerebral vascular accident) (HCC)   . Diabetes mellitus without complication (HCC)   . HLD (hyperlipidemia)   . Hypertension   . Seizures (HCC)     Medications:  Medications Prior to Admission  Medication Sig Dispense Refill Last Dose  . aspirin EC 81 MG tablet Take 81 mg by mouth daily.   09/28/2019 at Unknown time  . atorvastatin (LIPITOR) 40 MG tablet Take 40 mg by mouth at bedtime.   09/28/2019 at Unknown time  . calcium citrate-vitamin D (CITRACAL+D) 315-200 MG-UNIT tablet Take 1 tablet by mouth 2 (two) times daily.   09/28/2019 at Unknown time  . divalproex (DEPAKOTE) 250 MG DR tablet Take 250 mg by mouth 2 (two) times daily.   09/28/2019 at Unknown time  . glipiZIDE (GLUCOTROL XL) 10 MG 24 hr tablet Take 10 mg by mouth 2 (two) times daily.   09/28/2019 at Unknown time  . metFORMIN (GLUCOPHAGE-XR) 500 MG 24 hr tablet Take 1,000 mg by mouth 2 (two) times daily.   09/28/2019 at Unknown time  . metoprolol succinate (TOPROL-XL) 100 MG 24 hr tablet Take 100 mg by mouth daily.   09/28/2019 at  0900  . Multiple Vitamin (MULTIVITAMIN) capsule Take 1 capsule by mouth daily.   09/28/2019 at Unknown time  . omeprazole (PRILOSEC) 40 MG capsule Take 40 mg by mouth daily.   09/28/2019 at Unknown time  . ramipril (ALTACE) 10 MG capsule Take 10 mg by mouth daily.   09/28/2019 at Unknown time  . TRULICITY 0.75 MG/0.5ML SOPN Inject 0.5 mLs into the skin once a week. Wednesday   09/24/2019  . warfarin (COUMADIN) 1 MG tablet Take 1 mg by mouth daily.   09/28/2019 at 2000  . warfarin (COUMADIN) 6 MG tablet Take 6 mg by mouth daily. Taking with 1mg  = 7mg  total daily   09/28/2019 at 2000    Assessment: 64 yo male on warfarin PTA for history of recurrent VTE.  Warfarin has been on for cath . Pharmacy to restart warfarin with lovenox bridge  INR is increased from 1.4 to 2.3- after 2 doses from warfarin restart. CBC stable at Hgb 12.8, plt 199. No s/sx of bleeding documented. Scr stable at 1.35 (CrCl 50 mL/min). Depakote has been increased since PTA this admission - will monitor closely.   PTA regimen is 7 mg daily (LD 4/25).  Goal of Therapy:  INR 2-3 Monitor platelets by anticoagulation protocol: Yes   Plan:  -Order warfarin 1 mg tonight given INR jump -Continue Lovenox 70mg  [place] bid given only has received 2 doses of warfarin  -Daily PT/INR -CBC every 3 days while inpatient  Antonietta Jewel, PharmD, [place] Pharmacist  Phone: [phone] 10/05/2019 7:35 AM  Please check AMION for all [place] phone numbers After 10:00 PM, call [place] [phone]

## 2019-10-06 ENCOUNTER — Encounter (HOSPITAL_COMMUNITY)
Admission: EM | Disposition: A | Discharge status: Home Health | Payer: Self-pay | Source: Home / Self Care | Attending: Internal Medicine

## 2019-10-06 DIAGNOSIS — N289 Disorder of kidney and ureter, unspecified: Secondary | ICD-10-CM | POA: Diagnosis not present

## 2019-10-06 DIAGNOSIS — I214 Non-ST elevation (NSTEMI) myocardial infarction: Secondary | ICD-10-CM | POA: Diagnosis not present

## 2019-10-06 DIAGNOSIS — R55 Syncope and collapse: Secondary | ICD-10-CM

## 2019-10-06 HISTORY — PX: LOOP RECORDER INSERTION: EP1214

## 2019-10-06 LAB — GLUCOSE, CAPILLARY
Glucose-Capillary: 174 mg/dL — ABNORMAL HIGH (ref 70–99)
Glucose-Capillary: 317 mg/dL — ABNORMAL HIGH (ref 70–99)
Glucose-Capillary: 191 mg/dL — ABNORMAL HIGH (ref 70–99)
Glucose-Capillary: 284 mg/dL — ABNORMAL HIGH (ref 70–99)

## 2019-10-06 LAB — PROTIME-INR
INR: 2.9 — ABNORMAL HIGH (ref 0.8–1.2)
Prothrombin Time: 29.6 seconds — ABNORMAL HIGH (ref 11.4–15.2)

## 2019-10-06 SURGERY — LOOP RECORDER INSERTION
Anesthesia: LOCAL

## 2019-10-06 MED ORDER — LIDOCAINE-EPINEPHRINE 1 %-1:100000 IJ SOLN
INTRAMUSCULAR | Status: AC
  Filled 2019-10-06: qty 1

## 2019-10-06 MED ORDER — WARFARIN SODIUM 2.5 MG PO TABS
2.5000 mg | ORAL_TABLET | Freq: Once | ORAL | Status: AC
  Administered 2019-10-06: 2.5 mg via ORAL
  Filled 2019-10-06: qty 1

## 2019-10-06 MED ORDER — LIDOCAINE-EPINEPHRINE 1 %-1:100000 IJ SOLN
INTRAMUSCULAR | Status: DC | PRN
  Administered 2019-10-06: 30 mL

## 2019-10-06 SURGICAL SUPPLY — 2 items
MONITOR REVEAL LINQ II (Prosthesis & Implant Heart) ×1 IMPLANT
PACK LOOP INSERTION (CUSTOM PROCEDURE TRAY) ×2 IMPLANT
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
## 2019-10-06 NOTE — Progress Notes (Signed)
ANTICOAGULATION CONSULT NOTE  Pharmacy Consult for warfarin Indication: history VTE  Not on File  Patient Measurements: Height: 5\' 3"  (160 cm) Weight: 73.3 kg (161 lb 11.2 oz) IBW/kg (Calculated) : 56.9   Vital Signs: Temp: 98.5 F (36.9 C) (05/03 0503) Temp Source: Oral (05/03 0503) BP: 131/68 (05/03 0503) Pulse Rate: 74 (05/03 0503)  Labs: Recent Labs    10/04/19 0315 10/05/19 0436 10/06/19 0357  HGB  --  12.8*  --   HCT  --  40.6  --   PLT  --  199  --   LABPROT 16.4* 24.1* 29.6*  INR 1.4* 2.3* 2.9*  CREATININE  --  1.35*  --     Estimated Creatinine Clearance: 49.7 mL/min (A) (by C-G formula based on SCr of 1.35 mg/dL (H)).   Medical History: Past Medical History:  Diagnosis Date  . CAD (coronary artery disease) of artery bypass graft    s/p CABG x 4 in 1997  . COPD (chronic obstructive pulmonary disease) (HCC)   . CVA (cerebral vascular accident) (HCC)   . Diabetes mellitus without complication (HCC)   . HLD (hyperlipidemia)   . Hypertension   . Seizures (HCC)     Medications:  Medications Prior to Admission  Medication Sig Dispense Refill Last Dose  . aspirin EC 81 MG tablet Take 81 mg by mouth daily.   09/28/2019 at Unknown time  . atorvastatin (LIPITOR) 40 MG tablet Take 40 mg by mouth at bedtime.   09/28/2019 at Unknown time  . calcium citrate-vitamin D (CITRACAL+D) 315-200 MG-UNIT tablet Take 1 tablet by mouth 2 (two) times daily.   09/28/2019 at Unknown time  . divalproex (DEPAKOTE) 250 MG DR tablet Take 250 mg by mouth 2 (two) times daily.   09/28/2019 at Unknown time  . glipiZIDE (GLUCOTROL XL) 10 MG 24 hr tablet Take 10 mg by mouth 2 (two) times daily.   09/28/2019 at Unknown time  . metFORMIN (GLUCOPHAGE-XR) 500 MG 24 hr tablet Take 1,000 mg by mouth 2 (two) times daily.   09/28/2019 at Unknown time  . metoprolol succinate (TOPROL-XL) 100 MG 24 hr tablet Take 100 mg by mouth daily.   09/28/2019 at 0900  . Multiple Vitamin (MULTIVITAMIN) capsule Take  1 capsule by mouth daily.   09/28/2019 at Unknown time  . omeprazole (PRILOSEC) 40 MG capsule Take 40 mg by mouth daily.   09/28/2019 at Unknown time  . ramipril (ALTACE) 10 MG capsule Take 10 mg by mouth daily.   09/28/2019 at Unknown time  . TRULICITY 0.75 MG/0.5ML SOPN Inject 0.5 mLs into the skin once a week. Wednesday   09/24/2019  . warfarin (COUMADIN) 1 MG tablet Take 1 mg by mouth daily.   09/28/2019 at 2000  . warfarin (COUMADIN) 6 MG tablet Take 6 mg by mouth daily. Taking with 1mg  = 7mg  total daily   09/28/2019 at 2000    Assessment: 64 yo Moore on warfarin PTA for history of recurrent VTE.  Warfarin has been on for cath . Pharmacy to restart warfarin with lovenox bridge  INR is increased from 2.3 to 2.9- after 3 doses from warfarin restart. CBC stable at Hgb 12.8, plt 199 on last check 5/2. No s/sx of bleeding documented. Scr stable at 1.35 (CrCl 50 mL/min) on last check 5/2. Depakote has been increased since PTA this admission - will monitor closely with INR.   PTA regimen is 7 mg daily (LD 4/25).   Goal of Therapy:  INR 2-3 Monitor  platelets by anticoagulation protocol: Yes   Plan:  -Order warfarin 2.5 mg tonight  -Will discontinue enoxaparin at this time -Daily PT/INR -CBC every 3 days while inpatient  Antonietta Jewel, PharmD, [place] Clinical Pharmacist  Phone: [phone] 10/06/2019 7:17 AM  Please check AMION for all [facility] phone numbers After 10:00 PM, call [place] [phone]
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
## 2019-10-06 NOTE — Progress Notes (Signed)
Inpatient Diabetes Program Recommendations  AACE/ADA: New Consensus Statement on Inpatient Glycemic Control (2015)  Target Ranges:  Prepandial:   less than 140 mg/dL      Peak postprandial:   less than 180 mg/dL (1-2 hours)      Critically ill patients:  140 - 180 mg/dL   Lab Results  Component Value Date   GLUCAP 174 (H) 10/06/2019   HGBA1C 6.6 (H) 09/29/2019    Review of Glycemic Control Results for Thomas Moore, Thomas Moore (MRN [id number]) as of 10/06/2019 09:56  Ref. Range 10/05/2019 12:09 10/05/2019 17:01 10/05/2019 20:56 10/06/2019 07:28  Glucose-Capillary Latest Ref Range: 70 - 99 mg/dL 634 (H) 949 (H) 447 (H) 174 (H)   Diabetes history: Type 2 DM Outpatient Diabetes medications: Glipizide 10 mg BID, Metformin 1000 mg BID, Trulicity 0.75 mg Qwk Current orders for Inpatient glycemic control: Novolog 0-15 units TID, Novolog 0-5 units QHS  Inpatient Diabetes Program Recommendations:    Given trends, would recommend adding back oral antidiabetic medications:  -Metformin 1000 mg BID - Glipizide 10 mg BID.   Thanks, Lujean Rave, MSN, RNC-OB Diabetes Coordinator [phone] (8a-5p)

## 2019-10-07 ENCOUNTER — Telehealth: Payer: Self-pay

## 2019-10-07 LAB — GLUCOSE, CAPILLARY
Glucose-Capillary: 206 mg/dL — ABNORMAL HIGH (ref 70–99)
Glucose-Capillary: 305 mg/dL — ABNORMAL HIGH (ref 70–99)
Glucose-Capillary: 272 mg/dL — ABNORMAL HIGH (ref 70–99)
Glucose-Capillary: 176 mg/dL — ABNORMAL HIGH (ref 70–99)

## 2019-10-07 LAB — PROTIME-INR
INR: 2.3 — ABNORMAL HIGH (ref 0.8–1.2)
Prothrombin Time: 24.4 seconds — ABNORMAL HIGH (ref 11.4–15.2)

## 2019-10-07 MED ORDER — METFORMIN HCL 500 MG PO TABS
500.0000 mg | ORAL_TABLET | Freq: Two times a day (BID) | ORAL | Status: DC
  Administered 2019-10-07 – 2019-10-09 (×5): 500 mg via ORAL
  Filled 2019-10-07 (×5): qty 1

## 2019-10-07 MED ORDER — WARFARIN SODIUM 5 MG PO TABS
5.0000 mg | ORAL_TABLET | Freq: Once | ORAL | Status: AC
  Administered 2019-10-07: 5 mg via ORAL
  Filled 2019-10-07: qty 1

## 2019-10-07 NOTE — Progress Notes (Signed)
Wound site is stable.  No evidence of active bleeding, no hematoma INR today 2.3 Wound care instructions and follow up are in his AVS. OK to remove outer tegaderm dresissng today at time of discharge Steri strips to remain in place No showers for 3 days  Francis Dowse, PA-C

## 2019-10-07 NOTE — Progress Notes (Signed)
Inpatient Diabetes Program Recommendations  AACE/ADA: New Consensus Statement on Inpatient Glycemic Control (2015)  Target Ranges:  Prepandial:   less than 140 mg/dL      Peak postprandial:   less than 180 mg/dL (1-2 hours)      Critically ill patients:  140 - 180 mg/dL   Lab Results  Component Value Date   GLUCAP 206 (H) 10/07/2019   HGBA1C 6.6 (H) 09/29/2019    Review of Glycemic Control Results for NAMON, VILLARIN (MRN [id number]) as of 10/07/2019 11:10  Ref. Range 10/06/2019 11:00 10/06/2019 15:57 10/06/2019 21:29 10/07/2019 07:48  Glucose-Capillary Latest Ref Range: 70 - 99 mg/dL 818 (H) 403 (H) 754 (H) 206 (H)   Diabetes history: Type 2 DM Outpatient Diabetes medications: Glipizide 10 mg BID, Metformin 1000 mg BID, Trulicity 0.75 mg Qwk Current orders for Inpatient glycemic control: Novolog 0-15 units TID, Novolog 0-5 units QHS, Metformin 500 mg BID  Inpatient Diabetes Program Recommendations:    Noted addition of Metformin. With current trends, all CBGs above inpatient goals of 180 mg/dL.  Consider: -Adding Levemir 12 units QD -Increasing Metformin 1000 mg BID - Adding Glipizide 10 mg BID.   Thanks, Lujean Rave, MSN, RNC-OB Diabetes Coordinator [phone] (8a-5p)

## 2019-10-07 NOTE — Progress Notes (Signed)
ANTICOAGULATION CONSULT NOTE  Pharmacy Consult for warfarin Indication: history VTE  Not on File  Patient Measurements: Height: 5\' 3"  (160 cm) Weight: 73.5 kg (162 lb) IBW/kg (Calculated) : 56.9   Vital Signs: Temp: 98 F (36.7 C) (05/04 0940) Temp Source: Oral (05/04 0940) BP: 152/80 (05/04 0940) Pulse Rate: 84 (05/04 0940)  Labs: Recent Labs    10/05/19 0436 10/06/19 0357 10/07/19 0340  HGB 12.8*  --   --   HCT 40.6  --   --   PLT 199  --   --   LABPROT 24.1* 29.6* 24.4*  INR 2.3* 2.9* 2.3*  CREATININE 1.35*  --   --     Estimated Creatinine Clearance: 49.7 mL/min (A) (by C-G formula based on SCr of 1.35 mg/dL (H)).   Medical History: Past Medical History:  Diagnosis Date  . CAD (coronary artery disease) of artery bypass graft    s/p CABG x 4 in 1997  . COPD (chronic obstructive pulmonary disease) ([place])   . CVA (cerebral vascular accident) ([place])   . Diabetes mellitus without complication ([place])   . HLD (hyperlipidemia)   . Hypertension   . Seizures ([place])     Medications:  Medications Prior to Admission  Medication Sig Dispense Refill Last Dose  . aspirin EC 81 MG tablet Take 81 mg by mouth daily.   09/28/2019 at Unknown time  . atorvastatin (LIPITOR) 40 MG tablet Take 40 mg by mouth at bedtime.   09/28/2019 at Unknown time  . calcium citrate-vitamin D (CITRACAL+D) 315-200 MG-UNIT tablet Take 1 tablet by mouth 2 (two) times daily.   09/28/2019 at Unknown time  . divalproex (DEPAKOTE) 250 MG DR tablet Take 250 mg by mouth 2 (two) times daily.   09/28/2019 at Unknown time  . glipiZIDE (GLUCOTROL XL) 10 MG 24 hr tablet Take 10 mg by mouth 2 (two) times daily.   09/28/2019 at Unknown time  . metFORMIN (GLUCOPHAGE-XR) 500 MG 24 hr tablet Take 1,000 mg by mouth 2 (two) times daily.   09/28/2019 at Unknown time  . metoprolol succinate (TOPROL-XL) 100 MG 24 hr tablet Take 100 mg by mouth daily.   09/28/2019 at 0900  . Multiple Vitamin (MULTIVITAMIN) capsule Take 1 capsule  by mouth daily.   09/28/2019 at Unknown time  . omeprazole (PRILOSEC) 40 MG capsule Take 40 mg by mouth daily.   09/28/2019 at Unknown time  . ramipril (ALTACE) 10 MG capsule Take 10 mg by mouth daily.   09/28/2019 at Unknown time  . TRULICITY 6.23 JS/2.8BT SOPN Inject 0.5 mLs into the skin once a week. Wednesday   09/24/2019  . warfarin (COUMADIN) 1 MG tablet Take 1 mg by mouth daily.   09/28/2019 at 2000  . warfarin (COUMADIN) 6 MG tablet Take 6 mg by mouth daily. Taking with 1mg  = 7mg  total daily   09/28/2019 at 2000    Assessment: 64 yo male on warfarin PTA for history of recurrent VTE.  Warfarin has been on for cath . Pharmacy to restart warfarin with lovenox bridge  INR is increased from 2.3 to 2.9- after 3 doses from warfarin restart. CBC stable at Hgb 12.8, plt 199 on last check 5/2. No s/sx of bleeding documented. Scr stable at 1.35 (CrCl 50 mL/min) on last check 5/2. Depakote has been increased since PTA this admission - will monitor closely with INR.   PTA regimen is 7 mg daily (LD 4/25).   Goal of Therapy:  INR 2-3 Monitor platelets by  anticoagulation protocol: Yes   Plan:  -Order warfarin 5 mg tonight  -Daily PT/INR -CBC every 3 days while inpatient  Jenetta Downer, [facility] Clinical Pharmacist Phone [phone]  10/07/2019 12:41 PM   Please check AMION for all [facility] Pharmacy phone numbers After 10:00 PM, call Main Pharmacy [phone]

## 2019-10-07 NOTE — Telephone Encounter (Signed)
Opening error 
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
## 2019-10-08 LAB — CBC
HCT: 36.9 % — ABNORMAL LOW (ref 39.0–52.0)
Hemoglobin: 11.5 g/dL — ABNORMAL LOW (ref 13.0–17.0)
MCH: 28.2 pg (ref 26.0–34.0)
MCHC: 31.2 g/dL (ref 30.0–36.0)
MCV: 90.4 fL (ref 80.0–100.0)
Platelets: 193 10*3/uL (ref 150–400)
RBC: 4.08 MIL/uL — ABNORMAL LOW (ref 4.22–5.81)
RDW: 14.1 % (ref 11.5–15.5)
WBC: 5.5 10*3/uL (ref 4.0–10.5)
nRBC: 0 % (ref 0.0–0.2)

## 2019-10-08 LAB — BASIC METABOLIC PANEL
Anion gap: 13 (ref 5–15)
BUN: 22 mg/dL (ref 8–23)
CO2: 26 mmol/L (ref 22–32)
Calcium: 8.9 mg/dL (ref 8.9–10.3)
Chloride: 102 mmol/L (ref 98–111)
Creatinine, Ser: 1.14 mg/dL (ref 0.61–1.24)
GFR calc Af Amer: >60 mL/min (ref >60)
GFR calc non Af Amer: >60 mL/min (ref >60)
Glucose, Bld: 189 mg/dL — ABNORMAL HIGH (ref 70–99)
Potassium: 4.5 mmol/L (ref 3.5–5.1)
Sodium: 141 mmol/L (ref 135–145)

## 2019-10-08 LAB — PROTIME-INR
INR: 2.1 — ABNORMAL HIGH (ref 0.8–1.2)
Prothrombin Time: 22.8 seconds — ABNORMAL HIGH (ref 11.4–15.2)

## 2019-10-08 LAB — GLUCOSE, CAPILLARY
Glucose-Capillary: 290 mg/dL — ABNORMAL HIGH (ref 70–99)
Glucose-Capillary: 221 mg/dL — ABNORMAL HIGH (ref 70–99)
Glucose-Capillary: 215 mg/dL — ABNORMAL HIGH (ref 70–99)
Glucose-Capillary: 288 mg/dL — ABNORMAL HIGH (ref 70–99)

## 2019-10-08 LAB — VALPROIC ACID LEVEL: Valproic Acid Lvl: 32 ug/mL — ABNORMAL LOW (ref 50.0–100.0)

## 2019-10-08 MED ORDER — WARFARIN SODIUM 5 MG PO TABS: 5.0000 mg | ORAL_TABLET | Freq: Once | ORAL | 0 refills | Status: DC

## 2019-10-08 MED ORDER — DIVALPROEX SODIUM 500 MG PO DR TAB: 500.0000 mg | DELAYED_RELEASE_TABLET | Freq: Two times a day (BID) | ORAL | 0 refills | Status: DC

## 2019-10-08 MED ORDER — WARFARIN SODIUM 5 MG PO TABS
5.0000 mg | ORAL_TABLET | Freq: Once | ORAL | Status: AC
  Administered 2019-10-08: 5 mg via ORAL
  Filled 2019-10-08: qty 1

## 2019-10-08 MED FILL — DIVALPROEX SOD DR 500 MG TA: 500 | 30 days supply | Qty: 60 | Fill #0

## 2019-10-08 MED FILL — WARFARIN SODIUM 5 MG TABLET: 5 | 30 days supply | Qty: 30 | Fill #0

## 2019-10-08 NOTE — Care Management (Signed)
ED CM received call stating [facility] arrived to transport patient but states they are unable to transport patient stating patient appears ambulatory.  EDCM reviewed notes from [facility] team which states patient is unable to climb stairs s/p PACE maker 10/07/19.  Patient would not be appropriate for a Lyft transportation because he would not be able to get to up to his apartment based on information from [facility].
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
## 2019-10-08 NOTE — Progress Notes (Signed)
Discharge instructions (including medications) discussed with and copy provided to patient/caregiver 

## 2019-10-08 NOTE — TOC Transition Note (Signed)
Transition of Care [facility]) - CM/SW Discharge Note   Patient Details  Name: Thomas Moore MRN: [id number] Date of Birth: February 07, 1956  Transition of Care [facility]) CM/SW Contact:  Terrial Rhodes, LCSWA Phone Number: 10/08/2019, 2:18 PM   Clinical Narrative:     Patient will DC to: [address] [place] [address]  Anticipated DC date: 10/08/2019  Family notified: Hessie Diener   Transport by: Sharin Mons  ?  Per MD patient ready for DC to . RN, patient, patient's family, notified of DC. Discharge Summary sent home with patient. DC packet on chart. Ambulance transport requested for patient.  CSW signing off.   Final next level of care: Home/Self Care Barriers to Discharge: No Barriers Identified   Patient Goals and CMS Choice Patient states their goals for this hospitalization and ongoing recovery are:: to go home CMS Medicare.gov Compare Post Acute Care list provided to:: Patient Choice offered to / list presented to : Patient  Discharge Placement                Patient to be transferred to facility by: [facility]   Patient and family notified of of transfer: 10/08/19  Discharge Plan and Services In-house Referral: NA Discharge Planning Services: CM Consult Post Acute Care Choice: NA          DME Arranged: 3-N-1         HH Arranged: Refused HH          Social Determinants of Health (SDOH) Interventions     Readmission Risk Interventions No flowsheet data found.

## 2019-10-08 NOTE — Plan of Care (Signed)
  Problem: Education: Goal: Knowledge of General Education information will improve Description: Including pain rating scale, medication(s)/side effects and non-pharmacologic comfort measures 10/08/2019 1643 by Jarome Lamas, RN Outcome: Adequate for Discharge 10/08/2019 1643 by Jarome Lamas, RN Outcome: Adequate for Discharge

## 2019-10-08 NOTE — Plan of Care (Signed)
  Problem: Education: Goal: Knowledge of General Education information will improve Description: Including pain rating scale, medication(s)/side effects and non-pharmacologic comfort measures Outcome: Adequate for Discharge   
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
## 2019-10-08 NOTE — Progress Notes (Signed)
ANTICOAGULATION CONSULT NOTE  Pharmacy Consult for warfarin Indication: history VTE  Not on File  Patient Measurements: Height: 5\' 3"  (160 cm) Weight: 73.6 kg (162 lb 4.8 oz) IBW/kg (Calculated) : 56.9   Vital Signs: Temp: 97.5 F (36.4 C) (05/05 0513) Temp Source: Oral (05/05 0513) BP: 156/76 (05/05 0513)  Labs: Recent Labs    10/06/19 0357 10/07/19 0340 10/08/19 0503  HGB  --   --  11.5*  HCT  --   --  36.9*  PLT  --   --  193  LABPROT 29.6* 24.4* 22.8*  INR 2.9* 2.3* 2.1*  CREATININE  --   --  1.14    Estimated Creatinine Clearance: 58.9 mL/min (by C-G formula based on SCr of 1.14 mg/dL).   Medical History: Past Medical History:  Diagnosis Date  . CAD (coronary artery disease) of artery bypass graft    s/p CABG x 4 in 1997  . COPD (chronic obstructive pulmonary disease) ([place])   . CVA (cerebral vascular accident) ([place])   . Diabetes mellitus without complication ([place])   . HLD (hyperlipidemia)   . Hypertension   . Seizures ([place])     Medications:  Medications Prior to Admission  Medication Sig Dispense Refill Last Dose  . aspirin EC 81 MG tablet Take 81 mg by mouth daily.   09/28/2019 at Unknown time  . atorvastatin (LIPITOR) 40 MG tablet Take 40 mg by mouth at bedtime.   09/28/2019 at Unknown time  . calcium citrate-vitamin D (CITRACAL+D) 315-200 MG-UNIT tablet Take 1 tablet by mouth 2 (two) times daily.   09/28/2019 at Unknown time  . divalproex (DEPAKOTE) 250 MG DR tablet Take 250 mg by mouth 2 (two) times daily.   09/28/2019 at Unknown time  . glipiZIDE (GLUCOTROL XL) 10 MG 24 hr tablet Take 10 mg by mouth 2 (two) times daily.   09/28/2019 at Unknown time  . metFORMIN (GLUCOPHAGE-XR) 500 MG 24 hr tablet Take 1,000 mg by mouth 2 (two) times daily.   09/28/2019 at Unknown time  . metoprolol succinate (TOPROL-XL) 100 MG 24 hr tablet Take 100 mg by mouth daily.   09/28/2019 at 0900  . Multiple Vitamin (MULTIVITAMIN) capsule Take 1 capsule by mouth daily.   09/28/2019  at Unknown time  . omeprazole (PRILOSEC) 40 MG capsule Take 40 mg by mouth daily.   09/28/2019 at Unknown time  . ramipril (ALTACE) 10 MG capsule Take 10 mg by mouth daily.   09/28/2019 at Unknown time  . TRULICITY 4.16 SA/6.3KZ SOPN Inject 0.5 mLs into the skin once a week. Wednesday   09/24/2019  . warfarin (COUMADIN) 1 MG tablet Take 1 mg by mouth daily.   09/28/2019 at 2000  . warfarin (COUMADIN) 6 MG tablet Take 6 mg by mouth daily. Taking with 1mg  = 7mg  total daily   09/28/2019 at 2000    Assessment: 64 yo male on warfarin PTA for history of recurrent VTE.  Warfarin has been on for cath . Pharmacy to restart warfarin with lovenox bridge  INR is now 2.1. CBC stable at Hgb 11.5, plt 193. No s/sx of bleeding documented. Depakote has been increased since PTA this admission - will monitor closely with INR.   PTA regimen is 7 mg daily (LD 4/25).   Goal of Therapy:  INR 2-3 Monitor platelets by anticoagulation protocol: Yes   Plan:  -Order warfarin 5 mg tonight  -Daily PT/INR -CBC every 3 days while inpatient  Antonietta Jewel, PharmD, [place] Clinical Pharmacist  Phone: [phone] 10/08/2019 12:03 PM  Please check AMION for all [facility] Pharmacy phone numbers After 10:00 PM, call Main Pharmacy [phone]
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
## 2019-10-09 LAB — GLUCOSE, CAPILLARY: Glucose-Capillary: 196 mg/dL — ABNORMAL HIGH (ref 70–99)

## 2019-10-09 LAB — PROTIME-INR
INR: 3 — ABNORMAL HIGH (ref 0.8–1.2)
Prothrombin Time: 30.3 seconds — ABNORMAL HIGH (ref 11.4–15.2)

## 2019-10-09 MED ORDER — WARFARIN SODIUM 4 MG PO TABS: ORAL_TABLET | ORAL | Status: DC

## 2019-10-09 MED ORDER — WARFARIN SODIUM 1 MG PO TABS: 1.0000 mg | ORAL_TABLET | Freq: Once | ORAL | Status: DC
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
## 2019-10-09 NOTE — Care Management (Signed)
10-09-19 1157 Case Manager spoke with patient this morning regarding transportation home. Patient is willing to go home via Lyft. Case Manager called the apartment manager to see if they can assist the patient up the stairs and carry his belongings. Apartment manager is familiar with the patient and agreeable to request. Once the patient gets to the apartment building- maintenance will assist the patient up the 16 stairs to his apartment and carry his belongings for him. Lyft scheduled and arrival time was 1155 am. No further needs from Case Manager at this time. Gala Lewandowsky, RN,BSN Case Manager
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
## 2019-10-09 NOTE — Progress Notes (Signed)
ANTICOAGULATION CONSULT NOTE  Pharmacy Consult for warfarin Indication: history VTE  Not on File  Patient Measurements: Height: 5\' 3"  (160 cm) Weight: 74.4 kg (164 lb) IBW/kg (Calculated) : 56.9   Vital Signs: Temp: 97.9 F (36.6 C) (05/06 0343) Temp Source: Oral (05/06 0343) BP: 142/76 (05/06 0343) Pulse Rate: 66 (05/06 0343)  Labs: Recent Labs    10/07/19 0340 10/08/19 0503 10/09/19 0344  HGB  --  11.5*  --   HCT  --  36.9*  --   PLT  --  193  --   LABPROT 24.4* 22.8* 30.3*  INR 2.3* 2.1* 3.0*  CREATININE  --  1.14  --     Estimated Creatinine Clearance: 59.2 mL/min (by C-G formula based on SCr of 1.14 mg/dL).   Medical History: Past Medical History:  Diagnosis Date  . CAD (coronary artery disease) of artery bypass graft    s/p CABG x 4 in 1997  . COPD (chronic obstructive pulmonary disease) ([place])   . CVA (cerebral vascular accident) ([place])   . Diabetes mellitus without complication ([place])   . HLD (hyperlipidemia)   . Hypertension   . Seizures ([place])     Medications:  Medications Prior to Admission  Medication Sig Dispense Refill Last Dose  . aspirin EC 81 MG tablet Take 81 mg by mouth daily.   09/28/2019 at Unknown time  . atorvastatin (LIPITOR) 40 MG tablet Take 40 mg by mouth at bedtime.   09/28/2019 at Unknown time  . calcium citrate-vitamin D (CITRACAL+D) 315-200 MG-UNIT tablet Take 1 tablet by mouth 2 (two) times daily.   09/28/2019 at Unknown time  . divalproex (DEPAKOTE) 250 MG DR tablet Take 250 mg by mouth 2 (two) times daily.   09/28/2019 at Unknown time  . glipiZIDE (GLUCOTROL XL) 10 MG 24 hr tablet Take 10 mg by mouth 2 (two) times daily.   09/28/2019 at Unknown time  . metFORMIN (GLUCOPHAGE-XR) 500 MG 24 hr tablet Take 1,000 mg by mouth 2 (two) times daily.   09/28/2019 at Unknown time  . metoprolol succinate (TOPROL-XL) 100 MG 24 hr tablet Take 100 mg by mouth daily.   09/28/2019 at 0900  . Multiple Vitamin (MULTIVITAMIN) capsule Take 1 capsule by  mouth daily.   09/28/2019 at Unknown time  . omeprazole (PRILOSEC) 40 MG capsule Take 40 mg by mouth daily.   09/28/2019 at Unknown time  . ramipril (ALTACE) 10 MG capsule Take 10 mg by mouth daily.   09/28/2019 at Unknown time  . TRULICITY 4.09 WJ/1.9JY SOPN Inject 0.5 mLs into the skin once a week. Wednesday   09/24/2019  . warfarin (COUMADIN) 1 MG tablet Take 1 mg by mouth daily.   09/28/2019 at 2000  . warfarin (COUMADIN) 6 MG tablet Take 6 mg by mouth daily. Taking with 1mg  = 7mg  total daily   09/28/2019 at 2000    Assessment: 64 yo male on warfarin PTA for history of recurrent VTE.  Warfarin has been on for cath . Pharmacy to restart warfarin with lovenox bridge  INR today jumped from 2.1 to 3. CBC stable at Hgb 11.5, plt 193 on last check 5/5. No s/sx of bleeding documented. Depakote has been increased since PTA this admission - will monitor closely with INR.   PTA regimen is 7 mg daily (LD 4/25).   Goal of Therapy:  INR 2-3 Monitor platelets by anticoagulation protocol: Yes   Plan:  -Order warfarin 1 mg tonight  -Daily PT/INR -CBC every 3 days  while inpatient -INR has been variable since restart - doing weekly average would consider discharging on warfarin 4 mg with very close INR follow-up (according to PTA med list has warfarin 6 mg and 1 mg tablet - would do half tablet of warfarin 6 mg and full 1 mg tablet to make dose), will adjust as appropriate if remains in the hospital  Sherron Monday, PharmD, BCCCP Clinical Pharmacist  Phone: [phone] 10/09/2019 7:19 AM  Please check AMION for all [facility] Pharmacy phone numbers After 10:00 PM, call Main Pharmacy [phone]

## 2019-10-10 ENCOUNTER — Encounter: Payer: Self-pay | Admitting: Occupational Therapy

## 2019-10-16 ENCOUNTER — Telehealth (INDEPENDENT_AMBULATORY_CARE_PROVIDER_SITE_OTHER): Payer: 59 | Admitting: Emergency Medicine

## 2019-10-16 ENCOUNTER — Other Ambulatory Visit: Payer: Self-pay

## 2019-10-16 DIAGNOSIS — R4020 Unspecified coma: Secondary | ICD-10-CM

## 2019-10-16 DIAGNOSIS — Z95818 Presence of other cardiac implants and grafts: Secondary | ICD-10-CM

## 2019-10-16 NOTE — Progress Notes (Signed)
Patient verbally consented to ILR wound check via virtual visit due to COVID-19. Confirmed two patient identifiers.  Virtual ILR wound check. Steri strips removed by patient prior to wound check. Scab noted at incision, patient denies drainage, swelling, fever, or chills. Healing ecchymosis noted at site. Home monitor transmitting nightly. No episodes. Patient educated about wound care, Carelink monitor, and signs/symptoms of infection. Monthly summary reports and ROV with Dr. Ladona Ridgel PRN.

## 2019-11-05 DIAGNOSIS — R29818 Other symptoms and signs involving the nervous system: Secondary | ICD-10-CM | POA: Insufficient documentation

## 2019-11-09 LAB — CUP PACEART REMOTE DEVICE CHECK
Date Time Interrogation Session: 20210604173709
Implantable Pulse Generator Implant Date: 20210503

## 2019-11-10 ENCOUNTER — Ambulatory Visit (INDEPENDENT_AMBULATORY_CARE_PROVIDER_SITE_OTHER): Payer: 59 | Admitting: *Deleted

## 2019-11-10 DIAGNOSIS — R4020 Unspecified coma: Secondary | ICD-10-CM

## 2019-11-12 NOTE — Progress Notes (Signed)
Carelink Summary Report / Loop Recorder 

## 2019-12-11 ENCOUNTER — Ambulatory Visit (INDEPENDENT_AMBULATORY_CARE_PROVIDER_SITE_OTHER): Payer: Self-pay | Admitting: *Deleted

## 2019-12-11 DIAGNOSIS — I471 Supraventricular tachycardia: Secondary | ICD-10-CM

## 2019-12-11 LAB — CUP PACEART REMOTE DEVICE CHECK
Date Time Interrogation Session: 20210707173845
Implantable Pulse Generator Implant Date: 20210503

## 2019-12-12 NOTE — Progress Notes (Signed)
Carelink Summary Report / Loop Recorder 

## 2020-01-13 ENCOUNTER — Ambulatory Visit (INDEPENDENT_AMBULATORY_CARE_PROVIDER_SITE_OTHER): Payer: Self-pay | Admitting: *Deleted

## 2020-01-13 DIAGNOSIS — I471 Supraventricular tachycardia: Secondary | ICD-10-CM

## 2020-01-14 LAB — CUP PACEART REMOTE DEVICE CHECK
Date Time Interrogation Session: 20210811072535
Implantable Pulse Generator Implant Date: 20210503

## 2020-01-15 NOTE — Progress Notes (Signed)
Carelink Summary Report / Loop Recorder 

## 2020-02-16 ENCOUNTER — Ambulatory Visit (INDEPENDENT_AMBULATORY_CARE_PROVIDER_SITE_OTHER): Payer: Self-pay | Admitting: *Deleted

## 2020-02-16 DIAGNOSIS — I471 Supraventricular tachycardia: Secondary | ICD-10-CM

## 2020-02-16 LAB — CUP PACEART REMOTE DEVICE CHECK
Date Time Interrogation Session: 20210911173820
Implantable Pulse Generator Implant Date: 20210503

## 2020-02-17 ENCOUNTER — Ambulatory Visit: Payer: Self-pay | Admitting: Gerontology

## 2020-02-18 NOTE — Progress Notes (Signed)
Carelink Summary Report / Loop Recorder 

## 2020-02-19 ENCOUNTER — Ambulatory Visit: Payer: Medicaid Other | Admitting: Gerontology

## 2020-02-19 ENCOUNTER — Other Ambulatory Visit: Payer: Self-pay

## 2020-02-19 ENCOUNTER — Encounter: Payer: Self-pay | Admitting: Gerontology

## 2020-02-19 VITALS — BP 140/77 | HR 64 | Ht 63.0 in | Wt 175.5 lb

## 2020-02-19 DIAGNOSIS — E119 Type 2 diabetes mellitus without complications: Secondary | ICD-10-CM

## 2020-02-19 DIAGNOSIS — Z7689 Persons encountering health services in other specified circumstances: Secondary | ICD-10-CM | POA: Insufficient documentation

## 2020-02-19 DIAGNOSIS — E785 Hyperlipidemia, unspecified: Secondary | ICD-10-CM | POA: Insufficient documentation

## 2020-02-19 DIAGNOSIS — M549 Dorsalgia, unspecified: Secondary | ICD-10-CM | POA: Insufficient documentation

## 2020-02-19 DIAGNOSIS — Z8719 Personal history of other diseases of the digestive system: Secondary | ICD-10-CM

## 2020-02-19 DIAGNOSIS — Z87898 Personal history of other specified conditions: Secondary | ICD-10-CM | POA: Insufficient documentation

## 2020-02-19 DIAGNOSIS — Z7901 Long term (current) use of anticoagulants: Secondary | ICD-10-CM

## 2020-02-19 DIAGNOSIS — I10 Essential (primary) hypertension: Secondary | ICD-10-CM

## 2020-02-19 DIAGNOSIS — I471 Supraventricular tachycardia: Secondary | ICD-10-CM

## 2020-02-19 DIAGNOSIS — G8929 Other chronic pain: Secondary | ICD-10-CM

## 2020-02-19 DIAGNOSIS — Z5181 Encounter for therapeutic drug level monitoring: Secondary | ICD-10-CM

## 2020-02-19 MED ORDER — OMEPRAZOLE 40 MG PO CPDR: 40.0000 mg | DELAYED_RELEASE_CAPSULE | Freq: Every day | ORAL | 0 refills | Status: DC

## 2020-02-19 MED ORDER — WARFARIN SODIUM 6 MG PO TABS: 6.0000 mg | ORAL_TABLET | Freq: Every day | ORAL | 0 refills | Status: DC

## 2020-02-19 MED ORDER — BLOOD GLUCOSE MONITOR KIT: PACK | 0 refills | Status: DC

## 2020-02-19 MED ORDER — TRULICITY 0.75 MG/0.5ML ~~LOC~~ SOAJ: 0.7500 mg | SUBCUTANEOUS | 4 refills | Status: DC

## 2020-02-19 MED ORDER — LISINOPRIL 40 MG PO TABS: 40.0000 mg | ORAL_TABLET | Freq: Every day | ORAL | 0 refills | Status: DC

## 2020-02-19 MED ORDER — LISINOPRIL 10 MG PO TABS: 10.0000 mg | ORAL_TABLET | Freq: Every day | ORAL | 0 refills | Status: DC

## 2020-02-19 MED ORDER — GLIPIZIDE ER 10 MG PO TB24: 10.0000 mg | ORAL_TABLET | Freq: Two times a day (BID) | ORAL | 0 refills | Status: DC

## 2020-02-19 MED ORDER — METOPROLOL SUCCINATE ER 100 MG PO TB24: 100.0000 mg | ORAL_TABLET | Freq: Every day | ORAL | 0 refills | Status: DC

## 2020-02-19 MED ORDER — METFORMIN HCL ER 500 MG PO TB24: 1000.0000 mg | ORAL_TABLET | Freq: Two times a day (BID) | ORAL | 0 refills | Status: DC

## 2020-02-19 MED ORDER — ATORVASTATIN CALCIUM 40 MG PO TABS: 40.0000 mg | ORAL_TABLET | Freq: Every day | ORAL | 0 refills | Status: DC

## 2020-02-19 MED ORDER — RAMIPRIL 10 MG PO CAPS: 10.0000 mg | ORAL_CAPSULE | Freq: Every day | ORAL | 0 refills | Status: DC

## 2020-02-19 NOTE — Progress Notes (Signed)
Patient ID: Thomas Moore, male   DOB: 09/18/1955, 64 y.o.   MRN: [id number]  Chief Complaint  Patient presents with   Establish Care    Complications with losing insurance due losing/quitting job due to injuries, social security primary income   Back Pain    Lives in apartment and has to climb stairs, causes inability to perform ADLs    HPI Thomas Moore is a 64 y.o. male who presents to establish care and evaluation of his chronic problems. He has a history of type 2 diabetes and his last HgbA1c done 10/23/19 was 7.1%. He states that he takes Glipizide 10 mg bid, Trulicity 8.75 mg weekly, and Metformin 1000 mg bid. He checks his blood glucose twice daily and reports that its less than 200 mg/dl. His HgbA1c was checked during clinic and it was 7.2% and finger stick was 107 mg/dl. He denies hypoglycemic/hyperglycemic symptoms, peripheral neuropathy and performs daily foot checks. Marland Kitchen He also has a history of Seizure-like activity and takes Depakote 500 mg bid. He was seen on 10/28/2019 by [facility]rologist Dr Karen Kitchens E and his EEG done on 09/19/2019 was within normal limits. He's on chronic warfarin therapy for history of right leg DVT and Pulmonary embolism in 2001 and his goal INR is between 2 and 3 per Dr Neldon Mc on 01/09/2018. He continues to take 6 mg Warfarin daily at qhs, denies hematuria, hematochezia, bruises and active bleeding. He also states that he has a history of Hypertension and takes Metoprolol succinate 100 mg daily,Lisinopril 40 mg daily and continues on DASH diet He also has a history of Congestive heart failure and SVT, denies chest pain and  Palpitation. He also takes 40 mg Atorvastatin at bedtime. He also reports having chronic low back pain, and experiences pain with exertion such as climbing steps to his apartment. He takes 40 mg Omeprazole for acid reflux with relief. Overall, he states that he's doing well and offers no further complaint.   Past Medical History:  Diagnosis Date    CAD (coronary artery disease) of artery bypass graft    s/p CABG x 4 in 1997   COPD (chronic obstructive pulmonary disease) (HCC)    Coronary artery disease    CVA (cerebral vascular accident) ([place])    Diabetes mellitus without complication ([place])    GERD (gastroesophageal reflux disease)    History of single vessel coronary artery bypass    HLD (hyperlipidemia)    Hypertension    Seizures ([place])     Past Surgical History:  Procedure Laterality Date   CARDIAC SURGERY     CORNEAL TRANSPLANT Right    CORONARY ARTERY BYPASS GRAFT  1997   EYE SURGERY     FLEXOR TENDON REPAIR Left 07/11/2019   Procedure: FLEXOR tenolysis  REPAIR LEFT RING FINGER with tednon repair;  Surgeon: Hessie Knows, MD;  Location: [facility] ORS;  Service: Orthopedics;  Laterality: Left;   INCISION AND DRAINAGE ABSCESS Left 05/08/2019   Procedure: INCISION AND DRAINAGE ABSCESS;  Surgeon: Dereck Leep, MD;  Location: [facility] ORS;  Service: Orthopedics;  Laterality: Left;   LEFT HEART CATH AND CORS/GRAFTS ANGIOGRAPHY N/A 10/02/2019   Procedure: LEFT HEART CATH AND CORS/GRAFTS ANGIOGRAPHY;  Surgeon: Leonie Man, MD;  Location: [facility];  Service: Cardiovascular;  Laterality: N/A;   LOOP RECORDER INSERTION N/A 10/06/2019   Procedure: LOOP RECORDER INSERTION;  Surgeon: Evans Lance, MD;  Location: [facility];  Service: Cardiovascular;  Laterality: N/A;  Family History  Problem Relation Age of Onset   Seizures Brother     Social History Social History   Tobacco Use   Smoking status: Former Smoker    Types: Cigarettes   Smokeless tobacco: Never Used   Tobacco comment: Quit 40 years ago  Media planner   Vaping Use: Never used  Substance Use Topics   Alcohol use: Not Currently   Drug use: Never    No Known Allergies  Current Outpatient Medications  Medication Sig Dispense Refill   divalproex (DEPAKOTE) 500 MG DR tablet Take 1 tablet (500 mg total) by mouth 2 (two)  times daily. 60 tablet 0   glipiZIDE (GLUCOTROL XL) 10 MG 24 hr tablet Take 1 tablet (10 mg total) by mouth 2 (two) times daily. 60 tablet 0   metFORMIN (GLUCOPHAGE-XR) 500 MG 24 hr tablet Take 2 tablets (1,000 mg total) by mouth 2 (two) times daily. 60 tablet 0   metoprolol succinate (TOPROL-XL) 100 MG 24 hr tablet Take 1 tablet (100 mg total) by mouth daily. 30 tablet 0   Multiple Vitamin (MULTIVITAMIN) capsule Take 1 capsule by mouth daily.     Multiple Vitamins tablet Take 1 tablet by mouth daily.      omeprazole (PRILOSEC) 40 MG capsule Take 1 capsule (40 mg total) by mouth daily. 30 capsule 0   warfarin (COUMADIN) 6 MG tablet Take 1 tablet (6 mg total) by mouth at bedtime. 30 tablet 0   atorvastatin (LIPITOR) 40 MG tablet Take 1 tablet (40 mg total) by mouth at bedtime. 30 tablet 0   blood glucose meter kit and supplies KIT Dispense based on patient and insurance preference. Use up to four times daily as directed. (FOR ICD-9 250.00, 250.01). 1 each 0   calcium citrate-vitamin D (CITRACAL+D) 315-200 MG-UNIT tablet Take 1 tablet by mouth 2 (two) times daily.     HYDROcodone-acetaminophen (NORCO) 5-325 MG tablet Take 1 tablet by mouth every 6 (six) hours as needed. 20 tablet 0   lisinopril (ZESTRIL) 40 MG tablet Take 1 tablet (40 mg total) by mouth daily. 30 tablet 0   nitroGLYCERIN (NITROSTAT) 0.4 MG SL tablet Place 0.4 mg under the tongue every 5 (five) minutes as needed for chest pain. May take up to 3 doses     triamcinolone cream (KENALOG) 0.1 % Apply 1 application topically daily as needed (red places on leg).  (Patient not taking: Reported on 0/94/7096)     TRULICITY 2.83 MO/2.9UT SOPN Inject 0.75 mg into the skin once a week. Wednesday 1 mL 4   No current facility-administered medications for this visit.    Review of Systems Review of Systems  Constitutional: Negative.   HENT: Negative.   Eyes: Negative.   Respiratory: Negative.   Cardiovascular: Negative.    Gastrointestinal: Negative.   Endocrine: Negative.  Negative for polydipsia, polyphagia and polyuria.  Genitourinary: Negative.   Musculoskeletal: Positive for back pain (chronic back pain).  Skin: Negative.   Neurological: Negative.   Hematological: Negative.     Blood pressure 140/77, pulse 64, height '5\' 3"'  (1.6 m), weight 175 lb 8 oz (79.6 kg), SpO2 98 %.  Physical Exam Physical Exam HENT:     Head: Normocephalic and atraumatic.     Nose: Nose normal.     Mouth/Throat:     Mouth: Mucous membranes are moist.  Eyes:     Extraocular Movements: Extraocular movements intact.     Conjunctiva/sclera: Conjunctivae normal.     Pupils: Pupils are equal,  round, and reactive to light.  Cardiovascular:     Rate and Rhythm: Normal rate and regular rhythm.     Pulses: Normal pulses.     Heart sounds: Normal heart sounds.  Pulmonary:     Effort: Pulmonary effort is normal.     Breath sounds: Normal breath sounds.  Abdominal:     General: Bowel sounds are normal.     Palpations: Abdomen is soft.  Genitourinary:    Comments: Deferred per patient Musculoskeletal:        General: Normal range of motion.     Cervical back: Normal range of motion.  Skin:    General: Skin is warm and dry.  Neurological:     General: No focal deficit present.     Mental Status: He is alert and oriented to person, place, and time. Mental status is at baseline.  Psychiatric:        Mood and Affect: Mood normal.        Behavior: Behavior normal.        Thought Content: Thought content normal.        Judgment: Judgment normal.     Data Reviewed Lab and past medical history was reviewed.  Assessment and Plan  1. Encounter to establish care -Routine labs will be checked - CBC w/Diff; Future - Comp Met (CMET); Future - Lipid panel; Future - Urinalysis; Future  2. SVT (supraventricular tachycardia) ([place]) - He was advised to complete [facility] financial application for - Ambulatory referral to  Cardiology  3. Type 2 diabetes mellitus without complication, unspecified whether long term insulin use (HCC) -His HgbA1c was 7.2% and his goal should be less than 7%. He will continue on current treatment regimen, check his blood glucose bid, record and bring log to follow up appointment. His fasting reading goal should be between 80-130 mg/dl. He will continue on low carb/ non concentrated sweet diet. - glipiZIDE (GLUCOTROL XL) 10 MG 24 hr tablet; Take 1 tablet (10 mg total) by mouth 2 (two) times daily.  Dispense: 60 tablet; Refill: 0 - metFORMIN (GLUCOPHAGE-XR) 500 MG 24 hr tablet; Take 2 tablets (1,000 mg total) by mouth 2 (two) times daily.  Dispense: 60 tablet; Refill: 0 - TRULICITY 6.97 XY/8.0XK SOPN; Inject 0.75 mg into the skin once a week. Wednesday  Dispense: 1 mL; Refill: 4 - POCT HgB A1C - blood glucose meter kit and supplies KIT; Dispense based on patient and insurance preference. Use up to four times daily as directed. (FOR ICD-9 250.00, 250.01).  Dispense: 1 each; Refill: 0  4. History of gastroesophageal reflux (GERD) -His acid reflux is under control and he will continue on current treatment regimen. -Avoid spicy, fatty and fried food -Avoid sodas and sour juices -Avoid heavy meals -Avoid eating 4 hours before bedtime -Elevate head of bed at night - omeprazole (PRILOSEC) 40 MG capsule; Take 1 capsule (40 mg total) by mouth daily.  Dispense: 30 capsule; Refill: 0  5. History of seizure -He will continue on his Depakote and follow up with Neurologist at [facility]  6. Elevated lipids - He will continue on current treatment regimen, low fat/low cholesterol diet. - atorvastatin (LIPITOR) 40 MG tablet; Take 1 tablet (40 mg total) by mouth at bedtime.  Dispense: 30 tablet; Refill: 0  7. Essential hypertension - His blood pressure fluctuates, his goal should be less than 150/90. He will continue on current treatment regimen, DASH diet and exercise as tolerated. -  metoprolol succinate (TOPROL-XL) 100 MG 24  hr tablet; Take 1 tablet (100 mg total) by mouth daily.  Dispense: 30 tablet; Refill: 0 - lisinopril (ZESTRIL) 40 MG tablet; Take 1 tablet (40 mg total) by mouth daily.  Dispense: 30 tablet; Refill: 0  8. Alteration in anticoagulation - He will continue on Warfarin and will recheck PT/INR. He was advised to notify clinic for bleeding or go to the ED for active bleeding. - warfarin (COUMADIN) 6 MG tablet; Take 1 tablet (6 mg total) by mouth at bedtime.  Dispense: 30 tablet; Refill: 0 - Protime-INR ( [facility] ONLY); Future  9. Chronic back pain, unspecified back location, unspecified back pain laterality - He will follow up with Orthopedic Surgery.    Eila Runyan E Seniah Lawrence 02/21/2020, 1:36 PM

## 2020-02-19 NOTE — Patient Instructions (Signed)
DASH Eating Plan DASH stands for "Dietary Approaches to Stop Hypertension." The DASH eating plan is a healthy eating plan that has been shown to reduce high blood pressure (hypertension). It may also reduce your risk for type 2 diabetes, heart disease, and stroke. The DASH eating plan may also help with weight loss. What are tips for following this plan?  General guidelines  Avoid eating more than 2,300 mg (milligrams) of salt (sodium) a day. If you have hypertension, you may need to reduce your sodium intake to 1,500 mg a day.  Limit alcohol intake to no more than 1 drink a day for nonpregnant women and 2 drinks a day for men. One drink equals 12 oz of beer, 5 oz of wine, or 1 oz of hard liquor.  Work with your health care provider to maintain a healthy body weight or to lose weight. Ask what an ideal weight is for you.  Get at least 30 minutes of exercise that causes your heart to beat faster (aerobic exercise) most days of the week. Activities may include walking, swimming, or biking.  Work with your health care provider or diet and nutrition specialist (dietitian) to adjust your eating plan to your individual calorie needs. Reading food labels   Check food labels for the amount of sodium per serving. Choose foods with less than 5 percent of the Daily Value of sodium. Generally, foods with less than 300 mg of sodium per serving fit into this eating plan.  To find whole grains, look for the word "whole" as the first word in the ingredient list. Shopping  Buy products labeled as "low-sodium" or "no salt added."  Buy fresh foods. Avoid canned foods and premade or frozen meals. Cooking  Avoid adding salt when cooking. Use salt-free seasonings or herbs instead of table salt or sea salt. Check with your health care provider or pharmacist before using salt substitutes.  Do not fry foods. Cook foods using healthy methods such as baking, boiling, grilling, and broiling instead.  Cook with  heart-healthy oils, such as olive, canola, soybean, or sunflower oil. Meal planning  Eat a balanced diet that includes: ? 5 or more servings of fruits and vegetables each day. At each meal, try to fill half of your plate with fruits and vegetables. ? Up to 6-8 servings of whole grains each day. ? Less than 6 oz of lean meat, poultry, or fish each day. A 3-oz serving of meat is about the same size as a deck of cards. One egg equals 1 oz. ? 2 servings of low-fat dairy each day. ? A serving of nuts, seeds, or beans 5 times each week. ? Heart-healthy fats. Healthy fats called Omega-3 fatty acids are found in foods such as flaxseeds and coldwater fish, like sardines, salmon, and mackerel.  Limit how much you eat of the following: ? Canned or prepackaged foods. ? Food that is high in trans fat, such as fried foods. ? Food that is high in saturated fat, such as fatty meat. ? Sweets, desserts, sugary drinks, and other foods with added sugar. ? Full-fat dairy products.  Do not salt foods before eating.  Try to eat at least 2 vegetarian meals each week.  Eat more home-cooked food and less restaurant, buffet, and fast food.  When eating at a restaurant, ask that your food be prepared with less salt or no salt, if possible. What foods are recommended? The items listed may not be a complete list. Talk with your dietitian about   what dietary choices are best for you. Grains Whole-grain or whole-wheat bread. Whole-grain or whole-wheat pasta. Brown rice. Oatmeal. Quinoa. Bulgur. Whole-grain and low-sodium cereals. Pita bread. Low-fat, low-sodium crackers. Whole-wheat flour tortillas. Vegetables Fresh or frozen vegetables (raw, steamed, roasted, or grilled). Low-sodium or reduced-sodium tomato and vegetable juice. Low-sodium or reduced-sodium tomato sauce and tomato paste. Low-sodium or reduced-sodium canned vegetables. Fruits All fresh, dried, or frozen fruit. Canned fruit in natural juice (without  added sugar). Meat and other protein foods Skinless chicken or turkey. Ground chicken or turkey. Pork with fat trimmed off. Fish and seafood. Egg whites. Dried beans, peas, or lentils. Unsalted nuts, nut butters, and seeds. Unsalted canned beans. Lean cuts of beef with fat trimmed off. Low-sodium, lean deli meat. Dairy Low-fat (1%) or fat-free (skim) milk. Fat-free, low-fat, or reduced-fat cheeses. Nonfat, low-sodium ricotta or cottage cheese. Low-fat or nonfat yogurt. Low-fat, low-sodium cheese. Fats and oils Soft margarine without trans fats. Vegetable oil. Low-fat, reduced-fat, or light mayonnaise and salad dressings (reduced-sodium). Canola, safflower, olive, soybean, and sunflower oils. Avocado. Seasoning and other foods Herbs. Spices. Seasoning mixes without salt. Unsalted popcorn and pretzels. Fat-free sweets. What foods are not recommended? The items listed may not be a complete list. Talk with your dietitian about what dietary choices are best for you. Grains Baked goods made with fat, such as croissants, muffins, or some breads. Dry pasta or rice meal packs. Vegetables Creamed or fried vegetables. Vegetables in a cheese sauce. Regular canned vegetables (not low-sodium or reduced-sodium). Regular canned tomato sauce and paste (not low-sodium or reduced-sodium). Regular tomato and vegetable juice (not low-sodium or reduced-sodium). Pickles. Olives. Fruits Canned fruit in a light or heavy syrup. Fried fruit. Fruit in cream or butter sauce. Meat and other protein foods Fatty cuts of meat. Ribs. Fried meat. Bacon. Sausage. Bologna and other processed lunch meats. Salami. Fatback. Hotdogs. Bratwurst. Salted nuts and seeds. Canned beans with added salt. Canned or smoked fish. Whole eggs or egg yolks. Chicken or turkey with skin. Dairy Whole or 2% milk, cream, and half-and-half. Whole or full-fat cream cheese. Whole-fat or sweetened yogurt. Full-fat cheese. Nondairy creamers. Whipped toppings.  Processed cheese and cheese spreads. Fats and oils Butter. Stick margarine. Lard. Shortening. Ghee. Bacon fat. Tropical oils, such as coconut, palm kernel, or palm oil. Seasoning and other foods Salted popcorn and pretzels. Onion salt, garlic salt, seasoned salt, table salt, and sea salt. Worcestershire sauce. Tartar sauce. Barbecue sauce. Teriyaki sauce. Soy sauce, including reduced-sodium. Steak sauce. Canned and packaged gravies. Fish sauce. Oyster sauce. Cocktail sauce. Horseradish that you find on the shelf. Ketchup. Mustard. Meat flavorings and tenderizers. Bouillon cubes. Hot sauce and Tabasco sauce. Premade or packaged marinades. Premade or packaged taco seasonings. Relishes. Regular salad dressings. Where to find more information:  National Heart, Lung, and Blood Institute: www.nhlbi.nih.gov  American Heart Association: www.heart.org Summary  The DASH eating plan is a healthy eating plan that has been shown to reduce high blood pressure (hypertension). It may also reduce your risk for type 2 diabetes, heart disease, and stroke.  With the DASH eating plan, you should limit salt (sodium) intake to 2,300 mg a day. If you have hypertension, you may need to reduce your sodium intake to 1,500 mg a day.  When on the DASH eating plan, aim to eat more fresh fruits and vegetables, whole grains, lean proteins, low-fat dairy, and heart-healthy fats.  Work with your health care provider or diet and nutrition specialist (dietitian) to adjust your eating plan to your   individual calorie needs. This information is not intended to replace advice given to you by your health care provider. Make sure you discuss any questions you have with your health care provider. Document Revised: 05/04/2017 Document Reviewed: 05/15/2016 Elsevier Patient Education  2020 Elsevier Inc. Carbohydrate Counting for Diabetes Mellitus, Adult  Carbohydrate counting is a method of keeping track of how many carbohydrates you  eat. Eating carbohydrates naturally increases the amount of sugar (glucose) in the blood. Counting how many carbohydrates you eat helps keep your blood glucose within normal limits, which helps you manage your diabetes (diabetes mellitus). It is important to know how many carbohydrates you can safely have in each meal. This is different for every person. A diet and nutrition specialist (registered dietitian) can help you make a meal plan and calculate how many carbohydrates you should have at each meal and snack. Carbohydrates are found in the following foods:  Grains, such as breads and cereals.  Dried beans and soy products.  Starchy vegetables, such as potatoes, peas, and corn.  Fruit and fruit juices.  Milk and yogurt.  Sweets and snack foods, such as cake, cookies, candy, chips, and soft drinks. How do I count carbohydrates? There are two ways to count carbohydrates in food. You can use either of the methods or a combination of both. Reading "Nutrition Facts" on packaged food The "Nutrition Facts" list is included on the labels of almost all packaged foods and beverages in the [place] It includes:  The serving size.  Information about nutrients in each serving, including the grams (g) of carbohydrate per serving. To use the "Nutrition Facts":  Decide how many servings you will have.  Multiply the number of servings by the number of carbohydrates per serving.  The resulting number is the total amount of carbohydrates that you will be having. Learning standard serving sizes of other foods When you eat carbohydrate foods that are not packaged or do not include "Nutrition Facts" on the label, you need to measure the servings in order to count the amount of carbohydrates:  Measure the foods that you will eat with a food scale or measuring cup, if needed.  Decide how many standard-size servings you will eat.  Multiply the number of servings by 15. Most carbohydrate-rich foods have  about 15 g of carbohydrates per serving. ? For example, if you eat 8 oz (170 g) of strawberries, you will have eaten 2 servings and 30 g of carbohydrates (2 servings x 15 g = 30 g).  For foods that have more than one food mixed, such as soups and casseroles, you must count the carbohydrates in each food that is included. The following list contains standard serving sizes of common carbohydrate-rich foods. Each of these servings has about 15 g of carbohydrates:   hamburger bun or  English muffin.   oz (15 mL) syrup.   oz (14 g) jelly.  1 slice of bread.  1 six-inch tortilla.  3 oz (85 g) cooked rice or pasta.  4 oz (113 g) cooked dried beans.  4 oz (113 g) starchy vegetable, such as peas, corn, or potatoes.  4 oz (113 g) hot cereal.  4 oz (113 g) mashed potatoes or  of a large baked potato.  4 oz (113 g) canned or frozen fruit.  4 oz (120 mL) fruit juice.  4-6 crackers.  6 chicken nuggets.  6 oz (170 g) unsweetened dry cereal.  6 oz (170 g) plain fat-free yogurt or yogurt sweetened with   artificial sweeteners.  8 oz (240 mL) milk.  8 oz (170 g) fresh fruit or one small piece of fruit.  24 oz (680 g) popped popcorn. Example of carbohydrate counting Sample meal  3 oz (85 g) chicken breast.  6 oz (170 g) brown rice.  4 oz (113 g) corn.  8 oz (240 mL) milk.  8 oz (170 g) strawberries with sugar-free whipped topping. Carbohydrate calculation 1. Identify the foods that contain carbohydrates: ? Rice. ? Corn. ? Milk. ? Strawberries. 2. Calculate how many servings you have of each food: ? 2 servings rice. ? 1 serving corn. ? 1 serving milk. ? 1 serving strawberries. 3. Multiply each number of servings by 15 g: ? 2 servings rice x 15 g = 30 g. ? 1 serving corn x 15 g = 15 g. ? 1 serving milk x 15 g = 15 g. ? 1 serving strawberries x 15 g = 15 g. 4. Add together all of the amounts to find the total grams of carbohydrates eaten: ? 30 g + 15 g + 15 g + 15  g = 75 g of carbohydrates total. Summary  Carbohydrate counting is a method of keeping track of how many carbohydrates you eat.  Eating carbohydrates naturally increases the amount of sugar (glucose) in the blood.  Counting how many carbohydrates you eat helps keep your blood glucose within normal limits, which helps you manage your diabetes.  A diet and nutrition specialist (registered dietitian) can help you make a meal plan and calculate how many carbohydrates you should have at each meal and snack. This information is not intended to replace advice given to you by your health care provider. Make sure you discuss any questions you have with your health care provider. Document Revised: 12/14/2016 Document Reviewed: 11/03/2015 Elsevier Patient Education  2020 Elsevier Inc.  

## 2020-02-23 ENCOUNTER — Ambulatory Visit: Payer: Medicaid Other | Admitting: Pharmacy Technician

## 2020-02-23 ENCOUNTER — Other Ambulatory Visit: Payer: Self-pay

## 2020-02-23 DIAGNOSIS — Z79899 Other long term (current) drug therapy: Secondary | ICD-10-CM

## 2020-02-23 NOTE — Progress Notes (Signed)
Completed [facility] application and contract.    Patient agreed to all terms of the [facility] contract.   Patient will be eligible for Medicare beginning 06/05/2020.  Informed patient that he should sign-up for Medicare A, B & D.  Also, made patient aware that beginning 06/05/20, patient would no longer qualify for both [facility] and [facility] services.  Provided patient with contact information to be screened for a Medicare Savings Plan and L.I.S.   Provided patient with Community education officer based on his particular needs.    Trulicity Prescription Application completed with patient.  Forwarded to [facility] and patient for signature.  Upon receipt of signed application from provider and from patient, Trulicty Prescription Application will be submitted to [facility].  Sherilyn Dacosta Care Manager [facility]

## 2020-02-24 LAB — POCT GLYCOSYLATED HEMOGLOBIN (HGB A1C): Hemoglobin A1C: 7.2 % — AB (ref 4.0–5.6)

## 2020-02-25 ENCOUNTER — Other Ambulatory Visit: Payer: Medicaid Other

## 2020-02-25 ENCOUNTER — Other Ambulatory Visit: Payer: Self-pay

## 2020-02-25 DIAGNOSIS — Z7689 Persons encountering health services in other specified circumstances: Secondary | ICD-10-CM

## 2020-02-25 NOTE — Progress Notes (Unsigned)
proti

## 2020-02-26 LAB — CBC WITH DIFFERENTIAL/PLATELET
Basophils Absolute: 0 10*3/uL (ref 0.0–0.2)
Basos: 1 %
EOS (ABSOLUTE): 0.1 10*3/uL (ref 0.0–0.4)
Eos: 2 %
Hematocrit: 34.8 % — ABNORMAL LOW (ref 37.5–51.0)
Hemoglobin: 10.9 g/dL — ABNORMAL LOW (ref 13.0–17.7)
Immature Grans (Abs): 0 10*3/uL (ref 0.0–0.1)
Immature Granulocytes: 0 %
Lymphocytes Absolute: 0.9 10*3/uL (ref 0.7–3.1)
Lymphs: 16 %
MCH: 26.5 pg — ABNORMAL LOW (ref 26.6–33.0)
MCHC: 31.3 g/dL — ABNORMAL LOW (ref 31.5–35.7)
MCV: 85 fL (ref 79–97)
Monocytes Absolute: 0.4 10*3/uL (ref 0.1–0.9)
Monocytes: 8 %
Neutrophils Absolute: 4 10*3/uL (ref 1.4–7.0)
Neutrophils: 73 %
Platelets: 194 10*3/uL (ref 150–450)
RBC: 4.12 x10E6/uL — ABNORMAL LOW (ref 4.14–5.80)
RDW: 14.5 % (ref 11.6–15.4)
WBC: 5.5 10*3/uL (ref 3.4–10.8)

## 2020-02-26 LAB — COMPREHENSIVE METABOLIC PANEL
ALT: 18 IU/L (ref 0–44)
AST: 18 IU/L (ref 0–40)
Albumin/Globulin Ratio: 1.3 (ref 1.2–2.2)
Albumin: 4.1 g/dL (ref 3.8–4.8)
Alkaline Phosphatase: 89 IU/L (ref 44–121)
BUN/Creatinine Ratio: 17 (ref 10–24)
BUN: 20 mg/dL (ref 8–27)
Bilirubin Total: 0.3 mg/dL (ref 0.0–1.2)
CO2: 25 mmol/L (ref 20–29)
Calcium: 9.3 mg/dL (ref 8.6–10.2)
Chloride: 99 mmol/L (ref 96–106)
Creatinine, Ser: 1.17 mg/dL (ref 0.76–1.27)
GFR calc Af Amer: 76 mL/min/{1.73_m2} (ref >59)
GFR calc non Af Amer: 65 mL/min/{1.73_m2} (ref >59)
Globulin, Total: 3.1 g/dL (ref 1.5–4.5)
Glucose: 330 mg/dL — ABNORMAL HIGH (ref 65–99)
Potassium: 4 mmol/L (ref 3.5–5.2)
Sodium: 138 mmol/L (ref 134–144)
Total Protein: 7.2 g/dL (ref 6.0–8.5)

## 2020-02-26 LAB — LIPID PANEL
Chol/HDL Ratio: 4.6 ratio (ref 0.0–5.0)
Cholesterol, Total: 143 mg/dL (ref 100–199)
HDL: 31 mg/dL — ABNORMAL LOW (ref >39)
LDL Chol Calc (NIH): 78 mg/dL (ref 0–99)
Triglycerides: 200 mg/dL — ABNORMAL HIGH (ref 0–149)
VLDL Cholesterol Cal: 34 mg/dL (ref 5–40)

## 2020-02-26 LAB — URINALYSIS
Bilirubin, UA: NEGATIVE
Ketones, UA: NEGATIVE
Leukocytes,UA: NEGATIVE
Nitrite, UA: NEGATIVE
RBC, UA: NEGATIVE
Specific Gravity, UA: 1.018 (ref 1.005–1.030)
Urobilinogen, Ur: 0.2 mg/dL (ref 0.2–1.0)
pH, UA: 6 (ref 5.0–7.5)

## 2020-02-26 LAB — PROTIME-INR
INR: 2.5 — ABNORMAL HIGH (ref 0.9–1.2)
Prothrombin Time: 25.7 s — ABNORMAL HIGH (ref 9.1–12.0)

## 2020-03-09 ENCOUNTER — Telehealth: Payer: Self-pay | Admitting: Pharmacist

## 2020-03-09 NOTE — Telephone Encounter (Signed)
03/09/2020 8:58:19 AM - Trulicity faxed to [facility]  -- Rhetta Mura - Tuesday, March 09, 2020 8:57 AM --Daylene Posey application for Trulicity Inject 0.75mg  into the skin once a week on Wednesday, (4 boxes).

## 2020-03-10 ENCOUNTER — Ambulatory Visit: Payer: Medicaid Other | Admitting: Gerontology

## 2020-03-16 ENCOUNTER — Ambulatory Visit: Payer: Medicaid Other | Admitting: Gerontology

## 2020-03-20 LAB — CUP PACEART REMOTE DEVICE CHECK
Date Time Interrogation Session: 20211014173642
Implantable Pulse Generator Implant Date: 20210503

## 2020-03-22 ENCOUNTER — Ambulatory Visit (INDEPENDENT_AMBULATORY_CARE_PROVIDER_SITE_OTHER): Payer: Self-pay

## 2020-03-22 DIAGNOSIS — I471 Supraventricular tachycardia: Secondary | ICD-10-CM

## 2020-03-24 NOTE — Progress Notes (Signed)
Carelink Summary Report / Loop Recorder 

## 2020-03-25 ENCOUNTER — Other Ambulatory Visit: Payer: Self-pay | Admitting: Gerontology

## 2020-03-25 ENCOUNTER — Other Ambulatory Visit: Payer: Self-pay

## 2020-03-25 ENCOUNTER — Ambulatory Visit: Payer: Medicaid Other | Admitting: Gerontology

## 2020-03-25 VITALS — BP 145/69 | Temp 97.2°F | Resp 18 | Wt 173.9 lb

## 2020-03-25 DIAGNOSIS — Z87898 Personal history of other specified conditions: Secondary | ICD-10-CM

## 2020-03-25 DIAGNOSIS — I10 Essential (primary) hypertension: Secondary | ICD-10-CM

## 2020-03-25 DIAGNOSIS — Z7901 Long term (current) use of anticoagulants: Secondary | ICD-10-CM

## 2020-03-25 DIAGNOSIS — E785 Hyperlipidemia, unspecified: Secondary | ICD-10-CM

## 2020-03-25 DIAGNOSIS — Z0000 Encounter for general adult medical examination without abnormal findings: Secondary | ICD-10-CM

## 2020-03-25 DIAGNOSIS — Z5181 Encounter for therapeutic drug level monitoring: Secondary | ICD-10-CM

## 2020-03-25 DIAGNOSIS — Z8719 Personal history of other diseases of the digestive system: Secondary | ICD-10-CM

## 2020-03-25 DIAGNOSIS — E119 Type 2 diabetes mellitus without complications: Secondary | ICD-10-CM

## 2020-03-25 MED ORDER — GLIPIZIDE ER 10 MG PO TB24: 10.0000 mg | ORAL_TABLET | Freq: Two times a day (BID) | ORAL | 3 refills | Status: DC

## 2020-03-25 MED ORDER — WARFARIN SODIUM 6 MG PO TABS: 6.0000 mg | ORAL_TABLET | Freq: Every day | ORAL | 3 refills | Status: DC

## 2020-03-25 MED ORDER — METOPROLOL SUCCINATE ER 100 MG PO TB24: 100.0000 mg | ORAL_TABLET | Freq: Every day | ORAL | 3 refills | Status: DC

## 2020-03-25 MED ORDER — NITROGLYCERIN 0.4 MG SL SUBL: 0.4000 mg | SUBLINGUAL_TABLET | SUBLINGUAL | 2 refills | Status: DC | PRN

## 2020-03-25 MED ORDER — DIVALPROEX SODIUM 500 MG PO DR TAB: 500.0000 mg | DELAYED_RELEASE_TABLET | Freq: Two times a day (BID) | ORAL | 3 refills | Status: DC

## 2020-03-25 MED ORDER — OMEPRAZOLE 40 MG PO CPDR: 40.0000 mg | DELAYED_RELEASE_CAPSULE | Freq: Every day | ORAL | 3 refills | Status: DC

## 2020-03-25 MED ORDER — ATORVASTATIN CALCIUM 40 MG PO TABS: 40.0000 mg | ORAL_TABLET | Freq: Every day | ORAL | 3 refills | Status: DC

## 2020-03-25 MED ORDER — METFORMIN HCL ER 500 MG PO TB24: 1000.0000 mg | ORAL_TABLET | Freq: Two times a day (BID) | ORAL | 3 refills | Status: DC
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
## 2020-03-25 NOTE — Patient Instructions (Signed)
Carbohydrate Counting for Diabetes Mellitus, Adult  Carbohydrate counting is a method of keeping track of how many carbohydrates you eat. Eating carbohydrates naturally increases the amount of sugar (glucose) in the blood. Counting how many carbohydrates you eat helps keep your blood glucose within normal limits, which helps you manage your diabetes (diabetes mellitus). It is important to know how many carbohydrates you can safely have in each meal. This is different for every person. A diet and nutrition specialist (registered dietitian) can help you make a meal plan and calculate how many carbohydrates you should have at each meal and snack. Carbohydrates are found in the following foods:  Grains, such as breads and cereals.  Dried beans and soy products.  Starchy vegetables, such as potatoes, peas, and corn.  Fruit and fruit juices.  Milk and yogurt.  Sweets and snack foods, such as cake, cookies, candy, chips, and soft drinks. How do I count carbohydrates? There are two ways to count carbohydrates in food. You can use either of the methods or a combination of both. Reading "Nutrition Facts" on packaged food The "Nutrition Facts" list is included on the labels of almost all packaged foods and beverages in the [place] It includes:  The serving size.  Information about nutrients in each serving, including the grams (g) of carbohydrate per serving. To use the "Nutrition Facts":  Decide how many servings you will have.  Multiply the number of servings by the number of carbohydrates per serving.  The resulting number is the total amount of carbohydrates that you will be having. Learning standard serving sizes of other foods When you eat carbohydrate foods that are not packaged or do not include "Nutrition Facts" on the label, you need to measure the servings in order to count the amount of carbohydrates:  Measure the foods that you will eat with a food scale or measuring cup, if  needed.  Decide how many standard-size servings you will eat.  Multiply the number of servings by 15. Most carbohydrate-rich foods have about 15 g of carbohydrates per serving. ? For example, if you eat 8 oz (170 g) of strawberries, you will have eaten 2 servings and 30 g of carbohydrates (2 servings x 15 g = 30 g).  For foods that have more than one food mixed, such as soups and casseroles, you must count the carbohydrates in each food that is included. The following list contains standard serving sizes of common carbohydrate-rich foods. Each of these servings has about 15 g of carbohydrates:   hamburger bun or  English muffin.   oz (15 mL) syrup.   oz (14 g) jelly.  1 slice of bread.  1 six-inch tortilla.  3 oz (85 g) cooked rice or pasta.  4 oz (113 g) cooked dried beans.  4 oz (113 g) starchy vegetable, such as peas, corn, or potatoes.  4 oz (113 g) hot cereal.  4 oz (113 g) mashed potatoes or  of a large baked potato.  4 oz (113 g) canned or frozen fruit.  4 oz (120 mL) fruit juice.  4-6 crackers.  6 chicken nuggets.  6 oz (170 g) unsweetened dry cereal.  6 oz (170 g) plain fat-free yogurt or yogurt sweetened with artificial sweeteners.  8 oz (240 mL) milk.  8 oz (170 g) fresh fruit or one small piece of fruit.  24 oz (680 g) popped popcorn. Example of carbohydrate counting Sample meal  3 oz (85 g) chicken breast.  6 oz (170 g)   brown rice.  4 oz (113 g) corn.  8 oz (240 mL) milk.  8 oz (170 g) strawberries with sugar-free whipped topping. Carbohydrate calculation 1. Identify the foods that contain carbohydrates: ? Rice. ? Corn. ? Milk. ? Strawberries. 2. Calculate how many servings you have of each food: ? 2 servings rice. ? 1 serving corn. ? 1 serving milk. ? 1 serving strawberries. 3. Multiply each number of servings by 15 g: ? 2 servings rice x 15 g = 30 g. ? 1 serving corn x 15 g = 15 g. ? 1 serving milk x 15 g = 15 g. ? 1  serving strawberries x 15 g = 15 g. 4. Add together all of the amounts to find the total grams of carbohydrates eaten: ? 30 g + 15 g + 15 g + 15 g = 75 g of carbohydrates total. Summary  Carbohydrate counting is a method of keeping track of how many carbohydrates you eat.  Eating carbohydrates naturally increases the amount of sugar (glucose) in the blood.  Counting how many carbohydrates you eat helps keep your blood glucose within normal limits, which helps you manage your diabetes.  A diet and nutrition specialist (registered dietitian) can help you make a meal plan and calculate how many carbohydrates you should have at each meal and snack. This information is not intended to replace advice given to you by your health care provider. Make sure you discuss any questions you have with your health care provider. Document Revised: 12/14/2016 Document Reviewed: 11/03/2015 Elsevier Patient Education  2020 Elsevier Inc.  

## 2020-04-01 NOTE — Progress Notes (Unsigned)
On 03/25/2020, social work Tax inspector sent the following secure and encrypted email to [employer] transportation:  "Hello Moe,  There is a patient, Thomas Vecchio. Moore who needs transportation to and from a Cardiology appointment on April 08, 2020, at 3:15 pm at [facility] in [place].  His appointment is scheduled for 15 minutes in time.  His address is [address], [place], [place]  [address].  Please let me know if you have any questions.    Thank you,  Geraldine Solar Social Work Intern at [employer] [facility] [place] [phone]"  Social work intern sent a second secure and encrypted email the same day, which said:  "Hello Drue Stager,  I forgot to provide our cost center #, which is [id number].  Please bill to the department, [facility].  Thank you,  Geraldine Solar"

## 2020-04-06 ENCOUNTER — Telehealth: Payer: Self-pay

## 2020-04-06 NOTE — Telephone Encounter (Signed)
Called patient to make him aware of the email correspondence that was received concerning his transportation for his November 4th appointment at 3:15 p. M.  Social work Investment banker, operational all of her email correspondence as secure and encrypted and it was as follows:  "*Due sorry about that typo, with our dispatch they create the schedules a day before the appointment and then they call out the patients to inform of pick up times then. We have seen that Lyft/[employer] does not really reach [place] so to ensure that patient makes it securely to the appointment we will use Dispatch.       Monserrat [facility]  SW-Care Management  Transportation Coordinator  Transportation Services  Department: [phone]  Website: [email or web address]      From: Launa Grill Sent: Tuesday, April 06, 2020 2:50 PM To: Geraldine Solar [email or web address]> Subject: RE: Guy Sandifer to patient living in  [place] and going to [place] for his appointment we will be using our dispatch cars to pick him up and  per the usual Dispatch Request procedures, the patient should receive a call sometime later tomorrow (04/07/2020) afternoon, advising him of the information correlating to Thursday's ride.     Monserrat D[facility]  SW-Care Management  Transportation Coordinator  Transportation Services  Department: [phone]  Website: [email or web address]      From: Geraldine Solar [email or web address]> Sent: Tuesday, April 06, 2020 2:43 PM To: Carlynn Purl, Monserrat [email or web address]> Subject: SECURE     To clarify, his ride is confirmed, but he will receive a confirmation call tomorrow?     Geraldine Solar  Social Work Tax inspector at [employer] [facility] [place]  [phone]     From: Launa Grill [email or web address]> Sent: Tuesday, April 06, 2020 10:57 AM To: Geraldine Solar [email or web address]> Subject: [External] RE: SECURE      [ EXTERNAL EMAIL: Do not click any links or open attachments unless you know the sender and trust the content. ]  Laqueta Carina,     We are finishing his request he most likely will hear a confirmation of ride no later than tomorrow afternoon.     Monserrat Darnelle [facility]  SW-Care Management  Transportation Coordinator  Transportation Services  Department: [phone]  Website: [email or web address]"  Social work Tax inspector called patient to make him aware.

## 2020-04-06 NOTE — Telephone Encounter (Signed)
Patient called the clinic to confirm that [facility] was going to provide transportation for him for his Thursday, April 08, 2020 appointment.  Social work Tax inspector let him know that she would reach out via email to transportation to confirm previously scheduled transportation.    Social work Investment banker, operational the following email-  "Hello Monserrat,  I am following up on transportation that was previously scheduled for [employer]. [place] for November 4th.  He is going to [facility] in [place] for his 3:15 pm appointment.  He is wanting to make sure that transportation is set up and that he is fine for this appointment.  When will someone contact him?  Thank you,  Geraldine Solar Social Work Intern at [employer] [facility] [place] [phone]"

## 2020-04-08 ENCOUNTER — Ambulatory Visit (INDEPENDENT_AMBULATORY_CARE_PROVIDER_SITE_OTHER): Payer: Self-pay | Admitting: Internal Medicine

## 2020-04-08 ENCOUNTER — Other Ambulatory Visit: Payer: Self-pay

## 2020-04-08 ENCOUNTER — Encounter: Payer: Self-pay | Admitting: Internal Medicine

## 2020-04-08 ENCOUNTER — Telehealth: Payer: Self-pay | Admitting: General Practice

## 2020-04-08 ENCOUNTER — Telehealth: Payer: Self-pay

## 2020-04-08 VITALS — BP 164/88 | HR 75 | Ht 63.0 in | Wt 175.8 lb

## 2020-04-08 DIAGNOSIS — R4020 Unspecified coma: Secondary | ICD-10-CM

## 2020-04-08 NOTE — Telephone Encounter (Signed)
After receiving a message that patient had called the clinic wanting to make sure that his transportation was set for his appointment on November 4th in [place], social work Tax inspector reached out to [employer] and confirmed that transportation was set up and was scheduled to arrive at patient's home at 2:15 pm to take patient to his appointment and a driver would be standing by at 3:40 pm (expected time of pick up from appointment) to take patient home.  Social work Tax inspector then called the patient and informed him of the above. Patient stated that he had gotten a call the day before regarding his transportation.

## 2020-04-08 NOTE — Telephone Encounter (Signed)
   Thomas Moore DOB: [birth year] MRN: [id number]   RIDER WAIVER AND RELEASE OF LIABILITY  For purposes of improving physical access to our facilities, [facility] is pleased to partner with third parties to provide [place] patients or other authorized individuals the option of convenient, on-demand ground transportation services (the Chiropractor") through use of the technology service that enables users to request on-demand ground transportation from independent third-party providers.  By opting to use and accept these [employer], I, the undersigned, hereby agree on behalf of myself, and on behalf of any minor child using the [employer] for whom I am the parent or legal guardian, as follows:  1. Science writer provided to me are provided by independent third-party transportation providers who are not [employer] or employees and who are unaffiliated with [employer]. 2. [facility] is neither a transportation carrier nor a common or public carrier. 3. [facility] has no control over the quality or safety of the transportation that occurs as a result of the [employer]. 4. [facility] cannot guarantee that any third-party transportation provider will complete any arranged transportation service. 5. [facility] makes no representation, warranty, or guarantee regarding the reliability, timeliness, quality, safety, suitability, or availability of any of the Transport Services or that they will be error free. 6. I fully understand that traveling by vehicle involves risks and dangers of serious bodily injury, including permanent disability, paralysis, and death. I agree, on behalf of myself and on behalf of any minor child using the Transport Services for whom I am the parent or legal guardian, that the entire risk arising out of my use of the [employer] remains solely with me, to the maximum extent permitted under applicable law. 7. The [employer] are provided "as is" and "as available." [facility] disclaims all representations and warranties, express, implied or statutory, not expressly set out in these terms, including the implied warranties of merchantability and fitness for a particular purpose. 8. I hereby waive and release [place], its agents, employees, officers, directors, representatives, insurers, attorneys, assigns, successors, subsidiaries, and affiliates from any and all past, present, or future claims, demands, liabilities, actions, causes of action, or suits of any kind directly or indirectly arising from acceptance and use of the [employer]. 9. I further waive and release [facility] and its affiliates from all present and future liability and responsibility for any injury or death to persons or damages to property caused by or related to the use of the [employer]. 10. I have read this Waiver and Release of Liability, and I understand the terms used in it and their legal significance. This Waiver is freely and voluntarily given with the understanding that my right (as well as the right of any minor child for whom I am the parent or legal guardian using the [employer]) to legal recourse against  in connection with the [employer] is knowingly surrendered in return for use of these services.   I attest that I read the consent document to Thomas Moore, gave Mr. Fratto the opportunity to ask questions and answered the questions asked (if any). I affirm that Thomas Moore then provided consent for he's participation in this program.     Launa Grill

## 2020-04-08 NOTE — Patient Instructions (Signed)
Medication Instructions:  Your physician recommends that you continue on your current medications as directed. Please refer to the Current Medication list given to you today.  *If you need a refill on your cardiac medications before your next appointment, please call your pharmacy*   Lab Work: NONE   If you have labs (blood work) drawn today and your tests are completely normal, you will receive your results only by: . MyChart Message (if you have MyChart) OR . A paper copy in the mail If you have any lab test that is abnormal or we need to change your treatment, we will call you to review the results.   Testing/Procedures: NONE    Follow-Up: At [facility], you and your health needs are our priority.  As part of our continuing mission to provide you with exceptional heart care, we have created designated Provider Care Teams.  These Care Teams include your primary Cardiologist (physician) and Advanced Practice Providers (APPs -  Physician Assistants and Nurse Practitioners) who all work together to provide you with the care you need, when you need it.  We recommend signing up for the patient portal called "MyChart".  Sign up information is provided on this After Visit Summary.  MyChart is used to connect with patients for Virtual Visits (Telemedicine).  Patients are able to view lab/test results, encounter notes, upcoming appointments, etc.  Non-urgent messages can be sent to your provider as well.   To learn more about what you can do with MyChart, go to [email or web address].    Your next appointment:   1 year(s)  The format for your next appointment:   In Person  Provider:   Gregg Taylor, MD   Other Instructions Thank you for choosing [facility]!    
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
## 2020-04-19 ENCOUNTER — Other Ambulatory Visit: Payer: Self-pay

## 2020-04-19 ENCOUNTER — Ambulatory Visit: Payer: Medicaid Other

## 2020-04-19 DIAGNOSIS — Z79899 Other long term (current) drug therapy: Secondary | ICD-10-CM
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
## 2020-04-21 ENCOUNTER — Ambulatory Visit (INDEPENDENT_AMBULATORY_CARE_PROVIDER_SITE_OTHER): Payer: Self-pay

## 2020-04-21 DIAGNOSIS — I471 Supraventricular tachycardia: Secondary | ICD-10-CM

## 2020-04-21 LAB — CUP PACEART REMOTE DEVICE CHECK
Date Time Interrogation Session: 20211116173855
Implantable Pulse Generator Implant Date: 20210503

## 2020-04-22 NOTE — Progress Notes (Signed)
Carelink Summary Report / Loop Recorder 

## 2020-05-11 ENCOUNTER — Telehealth: Payer: Self-pay | Admitting: Pharmacy Technician

## 2020-05-11 NOTE — Telephone Encounter (Signed)
Patient received information about how to be screened and sign-up for Medicare Savings and L.I.S.  Patient will be eligible for a Medicare Part D/Advantage plan on June 05, 2020. Patient will be unable to obtain medication assistance at [facility] once Medicare Part D plan goes into effect.  Patient acknowledged that he understood.  Allenmichael Mcpartlin J. Shaquinta Peruski Care Manager [facility] 

## 2020-05-12 ENCOUNTER — Other Ambulatory Visit: Payer: Self-pay

## 2020-05-12 ENCOUNTER — Other Ambulatory Visit: Payer: Medicaid Other

## 2020-05-12 DIAGNOSIS — E119 Type 2 diabetes mellitus without complications: Secondary | ICD-10-CM

## 2020-05-12 DIAGNOSIS — Z5181 Encounter for therapeutic drug level monitoring: Secondary | ICD-10-CM

## 2020-05-12 DIAGNOSIS — Z7901 Long term (current) use of anticoagulants: Secondary | ICD-10-CM

## 2020-05-13 LAB — PROTIME-INR
INR: 2.4 — ABNORMAL HIGH (ref 0.9–1.2)
Prothrombin Time: 24.5 s — ABNORMAL HIGH (ref 9.1–12.0)

## 2020-05-13 LAB — URINALYSIS
Bilirubin, UA: NEGATIVE
Ketones, UA: NEGATIVE
Leukocytes,UA: NEGATIVE
Nitrite, UA: NEGATIVE
RBC, UA: NEGATIVE
Specific Gravity, UA: 1.019 (ref 1.005–1.030)
Urobilinogen, Ur: 0.2 mg/dL (ref 0.2–1.0)
pH, UA: 5 (ref 5.0–7.5)

## 2020-05-13 LAB — HEMOGLOBIN A1C
Est. average glucose Bld gHb Est-mCnc: 166 mg/dL
Hgb A1c MFr Bld: 7.4 % — ABNORMAL HIGH (ref 4.8–5.6)

## 2020-05-13 LAB — IRON AND TIBC
Iron Saturation: 15 % (ref 15–55)
Iron: 50 ug/dL (ref 38–169)
Total Iron Binding Capacity: 332 ug/dL (ref 250–450)
UIBC: 282 ug/dL (ref 111–343)

## 2020-05-18 ENCOUNTER — Other Ambulatory Visit: Payer: Self-pay | Admitting: Gerontology

## 2020-05-18 ENCOUNTER — Ambulatory Visit: Payer: Medicaid Other | Admitting: Gerontology

## 2020-05-18 ENCOUNTER — Other Ambulatory Visit: Payer: Self-pay

## 2020-05-18 VITALS — BP 117/61 | HR 62 | Temp 98.0°F | Resp 16 | Wt 173.9 lb

## 2020-05-18 DIAGNOSIS — E119 Type 2 diabetes mellitus without complications: Secondary | ICD-10-CM

## 2020-05-18 DIAGNOSIS — I10 Essential (primary) hypertension: Secondary | ICD-10-CM

## 2020-05-18 DIAGNOSIS — Z7901 Long term (current) use of anticoagulants: Secondary | ICD-10-CM

## 2020-05-18 DIAGNOSIS — Z5181 Encounter for therapeutic drug level monitoring: Secondary | ICD-10-CM

## 2020-05-18 MED ORDER — LISINOPRIL 40 MG PO TABS: 40.0000 mg | ORAL_TABLET | Freq: Every day | ORAL | 1 refills | Status: DC

## 2020-05-18 MED ORDER — WARFARIN SODIUM 6 MG PO TABS: 6.0000 mg | ORAL_TABLET | Freq: Every day | ORAL | 3 refills | Status: DC

## 2020-05-18 NOTE — Patient Instructions (Signed)
Carbohydrate Counting for Diabetes Mellitus, Adult  Carbohydrate counting is a method of keeping track of how many carbohydrates you eat. Eating carbohydrates naturally increases the amount of sugar (glucose) in the blood. Counting how many carbohydrates you eat helps keep your blood glucose within normal limits, which helps you manage your diabetes (diabetes mellitus). It is important to know how many carbohydrates you can safely have in each meal. This is different for every person. A diet and nutrition specialist (registered dietitian) can help you make a meal plan and calculate how many carbohydrates you should have at each meal and snack. Carbohydrates are found in the following foods:  Grains, such as breads and cereals.  Dried beans and soy products.  Starchy vegetables, such as potatoes, peas, and corn.  Fruit and fruit juices.  Milk and yogurt.  Sweets and snack foods, such as cake, cookies, candy, chips, and soft drinks. How do I count carbohydrates? There are two ways to count carbohydrates in food. You can use either of the methods or a combination of both. Reading "Nutrition Facts" on packaged food The "Nutrition Facts" list is included on the labels of almost all packaged foods and beverages in the [place] It includes:  The serving size.  Information about nutrients in each serving, including the grams (g) of carbohydrate per serving. To use the "Nutrition Facts":  Decide how many servings you will have.  Multiply the number of servings by the number of carbohydrates per serving.  The resulting number is the total amount of carbohydrates that you will be having. Learning standard serving sizes of other foods When you eat carbohydrate foods that are not packaged or do not include "Nutrition Facts" on the label, you need to measure the servings in order to count the amount of carbohydrates:  Measure the foods that you will eat with a food scale or measuring cup, if needed.   Decide how many standard-size servings you will eat.  Multiply the number of servings by 15. Most carbohydrate-rich foods have about 15 g of carbohydrates per serving. ? For example, if you eat 8 oz (170 g) of strawberries, you will have eaten 2 servings and 30 g of carbohydrates (2 servings x 15 g = 30 g).  For foods that have more than one food mixed, such as soups and casseroles, you must count the carbohydrates in each food that is included. The following list contains standard serving sizes of common carbohydrate-rich foods. Each of these servings has about 15 g of carbohydrates:   hamburger bun or  English muffin.   oz (15 mL) syrup.   oz (14 g) jelly.  1 slice of bread.  1 six-inch tortilla.  3 oz (85 g) cooked rice or pasta.  4 oz (113 g) cooked dried beans.  4 oz (113 g) starchy vegetable, such as peas, corn, or potatoes.  4 oz (113 g) hot cereal.  4 oz (113 g) mashed potatoes or  of a large baked potato.  4 oz (113 g) canned or frozen fruit.  4 oz (120 mL) fruit juice.  4-6 crackers.  6 chicken nuggets.  6 oz (170 g) unsweetened dry cereal.  6 oz (170 g) plain fat-free yogurt or yogurt sweetened with artificial sweeteners.  8 oz (240 mL) milk.  8 oz (170 g) fresh fruit or one small piece of fruit.  24 oz (680 g) popped popcorn. Example of carbohydrate counting Sample meal  3 oz (85 g) chicken breast.  6 oz (170 g)   brown rice.  4 oz (113 g) corn.  8 oz (240 mL) milk.  8 oz (170 g) strawberries with sugar-free whipped topping. Carbohydrate calculation 1. Identify the foods that contain carbohydrates: ? Rice. ? Corn. ? Milk. ? Strawberries. 2. Calculate how many servings you have of each food: ? 2 servings rice. ? 1 serving corn. ? 1 serving milk. ? 1 serving strawberries. 3. Multiply each number of servings by 15 g: ? 2 servings rice x 15 g = 30 g. ? 1 serving corn x 15 g = 15 g. ? 1 serving milk x 15 g = 15 g. ? 1 serving  strawberries x 15 g = 15 g. 4. Add together all of the amounts to find the total grams of carbohydrates eaten: ? 30 g + 15 g + 15 g + 15 g = 75 g of carbohydrates total. Summary  Carbohydrate counting is a method of keeping track of how many carbohydrates you eat.  Eating carbohydrates naturally increases the amount of sugar (glucose) in the blood.  Counting how many carbohydrates you eat helps keep your blood glucose within normal limits, which helps you manage your diabetes.  A diet and nutrition specialist (registered dietitian) can help you make a meal plan and calculate how many carbohydrates you should have at each meal and snack. This information is not intended to replace advice given to you by your health care provider. Make sure you discuss any questions you have with your health care provider. Document Revised: 12/14/2016 Document Reviewed: 11/03/2015 Elsevier Patient Education  2020 [employer] DASH Eating Plan DASH stands for "Dietary Approaches to Stop Hypertension." The DASH eating plan is a healthy eating plan that has been shown to reduce high blood pressure (hypertension). It may also reduce your risk for type 2 diabetes, heart disease, and stroke. The DASH eating plan may also help with weight loss. What are tips for following this plan?  General guidelines  Avoid eating more than 2,300 mg (milligrams) of salt (sodium) a day. If you have hypertension, you may need to reduce your sodium intake to 1,500 mg a day.  Limit alcohol intake to no more than 1 drink a day for nonpregnant women and 2 drinks a day for men. One drink equals 12 oz of beer, 5 oz of wine, or 1 oz of hard liquor.  Work with your health care provider to maintain a healthy body weight or to lose weight. Ask what an ideal weight is for you.  Get at least 30 minutes of exercise that causes your heart to beat faster (aerobic exercise) most days of the week. Activities may include walking, swimming, or  biking.  Work with your health care provider or diet and nutrition specialist (dietitian) to adjust your eating plan to your individual calorie needs. Reading food labels   Check food labels for the amount of sodium per serving. Choose foods with less than 5 percent of the Daily Value of sodium. Generally, foods with less than 300 mg of sodium per serving fit into this eating plan.  To find whole grains, look for the word "whole" as the first word in the ingredient list. Shopping  Buy products labeled as "low-sodium" or "no salt added."  Buy fresh foods. Avoid canned foods and premade or frozen meals. Cooking  Avoid adding salt when cooking. Use salt-free seasonings or herbs instead of table salt or sea salt. Check with your health care provider or pharmacist before using salt substitutes.  Do not   fry foods. Cook foods using healthy methods such as baking, boiling, grilling, and broiling instead.  Cook with heart-healthy oils, such as olive, canola, soybean, or sunflower oil. Meal planning  Eat a balanced diet that includes: ? 5 or more servings of fruits and vegetables each day. At each meal, try to fill half of your plate with fruits and vegetables. ? Up to 6-8 servings of whole grains each day. ? Less than 6 oz of lean meat, poultry, or fish each day. A 3-oz serving of meat is about the same size as a deck of cards. One egg equals 1 oz. ? 2 servings of low-fat dairy each day. ? A serving of nuts, seeds, or beans 5 times each week. ? Heart-healthy fats. Healthy fats called Omega-3 fatty acids are found in foods such as flaxseeds and coldwater fish, like sardines, salmon, and mackerel.  Limit how much you eat of the following: ? Canned or prepackaged foods. ? Food that is high in trans fat, such as fried foods. ? Food that is high in saturated fat, such as fatty meat. ? Sweets, desserts, sugary drinks, and other foods with added sugar. ? Full-fat dairy products.  Do not salt  foods before eating.  Try to eat at least 2 vegetarian meals each week.  Eat more home-cooked food and less restaurant, buffet, and fast food.  When eating at a restaurant, ask that your food be prepared with less salt or no salt, if possible. What foods are recommended? The items listed may not be a complete list. Talk with your dietitian about what dietary choices are best for you. Grains Whole-grain or whole-wheat bread. Whole-grain or whole-wheat pasta. Brown rice. Oatmeal. Quinoa. Bulgur. Whole-grain and low-sodium cereals. Pita bread. Low-fat, low-sodium crackers. Whole-wheat flour tortillas. Vegetables Fresh or frozen vegetables (raw, steamed, roasted, or grilled). Low-sodium or reduced-sodium tomato and vegetable juice. Low-sodium or reduced-sodium tomato sauce and tomato paste. Low-sodium or reduced-sodium canned vegetables. Fruits All fresh, dried, or frozen fruit. Canned fruit in natural juice (without added sugar). Meat and other protein foods Skinless chicken or turkey. Ground chicken or turkey. Pork with fat trimmed off. Fish and seafood. Egg whites. Dried beans, peas, or lentils. Unsalted nuts, nut butters, and seeds. Unsalted canned beans. Lean cuts of beef with fat trimmed off. Low-sodium, lean deli meat. Dairy Low-fat (1%) or fat-free (skim) milk. Fat-free, low-fat, or reduced-fat cheeses. Nonfat, low-sodium ricotta or cottage cheese. Low-fat or nonfat yogurt. Low-fat, low-sodium cheese. Fats and oils Soft margarine without trans fats. Vegetable oil. Low-fat, reduced-fat, or light mayonnaise and salad dressings (reduced-sodium). Canola, safflower, olive, soybean, and sunflower oils. Avocado. Seasoning and other foods Herbs. Spices. Seasoning mixes without salt. Unsalted popcorn and pretzels. Fat-free sweets. What foods are not recommended? The items listed may not be a complete list. Talk with your dietitian about what dietary choices are best for you. Grains Baked goods  made with fat, such as croissants, muffins, or some breads. Dry pasta or rice meal packs. Vegetables Creamed or fried vegetables. Vegetables in a cheese sauce. Regular canned vegetables (not low-sodium or reduced-sodium). Regular canned tomato sauce and paste (not low-sodium or reduced-sodium). Regular tomato and vegetable juice (not low-sodium or reduced-sodium). Pickles. Olives. Fruits Canned fruit in a light or heavy syrup. Fried fruit. Fruit in cream or butter sauce. Meat and other protein foods Fatty cuts of meat. Ribs. Fried meat. Bacon. Sausage. Bologna and other processed lunch meats. Salami. Fatback. Hotdogs. Bratwurst. Salted nuts and seeds. Canned beans with added salt.   Canned or smoked fish. Whole eggs or egg yolks. Chicken or turkey with skin. Dairy Whole or 2% milk, cream, and half-and-half. Whole or full-fat cream cheese. Whole-fat or sweetened yogurt. Full-fat cheese. Nondairy creamers. Whipped toppings. Processed cheese and cheese spreads. Fats and oils Butter. Stick margarine. Lard. Shortening. Ghee. Bacon fat. Tropical oils, such as coconut, palm kernel, or palm oil. Seasoning and other foods Salted popcorn and pretzels. Onion salt, garlic salt, seasoned salt, table salt, and sea salt. Worcestershire sauce. Tartar sauce. Barbecue sauce. Teriyaki sauce. Soy sauce, including reduced-sodium. Steak sauce. Canned and packaged gravies. Fish sauce. Oyster sauce. Cocktail sauce. Horseradish that you find on the shelf. Ketchup. Mustard. Meat flavorings and tenderizers. Bouillon cubes. Hot sauce and Tabasco sauce. Premade or packaged marinades. Premade or packaged taco seasonings. Relishes. Regular salad dressings. Where to find more information:  National Heart, Lung, and Blood Institute: www.nhlbi.nih.gov  American Heart Association: www.heart.org Summary  The DASH eating plan is a healthy eating plan that has been shown to reduce high blood pressure (hypertension). It may also reduce  your risk for type 2 diabetes, heart disease, and stroke.  With the DASH eating plan, you should limit salt (sodium) intake to 2,300 mg a day. If you have hypertension, you may need to reduce your sodium intake to 1,500 mg a day.  When on the DASH eating plan, aim to eat more fresh fruits and vegetables, whole grains, lean proteins, low-fat dairy, and heart-healthy fats.  Work with your health care provider or diet and nutrition specialist (dietitian) to adjust your eating plan to your individual calorie needs. This information is not intended to replace advice given to you by your health care provider. Make sure you discuss any questions you have with your health care provider. Document Revised: 05/04/2017 Document Reviewed: 05/15/2016 Elsevier Patient Education  2020 Elsevier Inc.  

## 2020-05-18 NOTE — Progress Notes (Signed)
Established Patient Office Visit  Subjective:  Patient ID: Thomas Moore, male    DOB: Sep 14, 1955  Age: 64 y.o. MRN: [id number]  CC: No chief complaint on file.  Mr Thomas Moore is a 64 y/o male who presents for follow up of Type 2 diabetes, lab review and medication refill. His HgbA1c done on 05/12/2020 increased from 7.2% to 7.4 %. He states that he is compliant with his medications, and adheres to ADA diet. He checks his fasting blood glucose daily, and he states that it is never greater than 150 mg/dl. His blood glucose was checked during visit and it was 232 mg/dl. He denies hypoglycemic symptoms, and peripheral neuropathy, and he performs daily foot checks. His Urinalysis, showed proteinuria and glycosuria. He continues on 6 mg Coumadin daily, his INR was 2.4, he denies bruises, hematuria and hematochezia. He states that his Medicare will be active January first 2022 and he will contact his previous PCP. Overall, he states that he's doing well and offers no further complaint.  Past Medical History:  Diagnosis Date   CAD (coronary artery disease) of artery bypass graft    s/p CABG x 4 in 1997   COPD (chronic obstructive pulmonary disease) (HCC)    Coronary artery disease    CVA (cerebral vascular accident) ([place])    Diabetes mellitus without complication ([place])    GERD (gastroesophageal reflux disease)    History of single vessel coronary artery bypass    HLD (hyperlipidemia)    Hypertension    Seizures ([place])     Past Surgical History:  Procedure Laterality Date   CARDIAC SURGERY     CORNEAL TRANSPLANT Right    CORONARY ARTERY BYPASS GRAFT  1997   EYE SURGERY     FLEXOR TENDON REPAIR Left 07/11/2019   Procedure: FLEXOR tenolysis  REPAIR LEFT RING FINGER with tednon repair;  Surgeon: Hessie Knows, MD;  Location: [facility];  Service: Orthopedics;  Laterality: Left;   INCISION AND DRAINAGE ABSCESS Left 05/08/2019   Procedure: INCISION AND DRAINAGE ABSCESS;  Surgeon:  Dereck Leep, MD;  Location: [facility];  Service: Orthopedics;  Laterality: Left;   LEFT HEART CATH AND CORS/GRAFTS ANGIOGRAPHY N/A 10/02/2019   Procedure: LEFT HEART CATH AND CORS/GRAFTS ANGIOGRAPHY;  Surgeon: Leonie Man, MD;  Location: [place] [facility];  Service: Cardiovascular;  Laterality: N/A;   LOOP RECORDER INSERTION N/A 10/06/2019   Procedure: LOOP RECORDER INSERTION;  Surgeon: Evans Lance, MD;  Location: [place] [facility];  Service: Cardiovascular;  Laterality: N/A;    Family History  Problem Relation Age of Onset   Seizures Brother     Social History   Socioeconomic History   Marital status: Single    Spouse name: Not on file   Number of children: 0   Years of education: Not on file   Highest education level: High school graduate  Occupational History   Occupation: Unemployed    Comment: Trying to get disability  Tobacco Use   Smoking status: Former Smoker    Types: Cigarettes   Smokeless tobacco: Never Used   Tobacco comment: Quit 40 years ago  Media planner   Vaping Use: Never used  Substance and Sexual Activity   Alcohol use: Not Currently   Drug use: Never   Sexual activity: Never  Other Topics Concern   Not on file  Social History Narrative   ** Merged History Encounter **    Lives in an apartment on [address], has to  climb 17 floors   Social Determinants of Health   Financial Resource Strain: Not on file  Food Insecurity: Not on file  Transportation Needs: Unmet Transportation Needs   Lack of Transportation (Medical): Yes   Lack of Transportation (Non-Medical): Yes  Physical Activity: Not on file  Stress: No Stress Concern Present   Feeling of Stress : Not at all  Social Connections: Not on file  Intimate Partner Violence: Not on file    Outpatient Medications Prior to Visit  Medication Sig Dispense Refill   atorvastatin (LIPITOR) 40 MG tablet Take 1 tablet (40 mg total) by mouth at bedtime. 30 tablet 3   calcium  citrate-vitamin D (CITRACAL+D) 315-200 MG-UNIT tablet Take 1 tablet by mouth 2 (two) times daily.     divalproex (DEPAKOTE) 500 MG DR tablet Take 1 tablet (500 mg total) by mouth 2 (two) times daily. 60 tablet 3   glipiZIDE (GLUCOTROL XL) 10 MG 24 hr tablet Take 1 tablet (10 mg total) by mouth 2 (two) times daily. 60 tablet 3   metFORMIN (GLUCOPHAGE-XR) 500 MG 24 hr tablet Take 2 tablets (1,000 mg total) by mouth 2 (two) times daily. 60 tablet 3   metoprolol succinate (TOPROL-XL) 100 MG 24 hr tablet Take 1 tablet (100 mg total) by mouth daily. 30 tablet 3   Multiple Vitamin (MULTIVITAMIN) capsule Take 1 capsule by mouth daily.     nitroGLYCERIN (NITROSTAT) 0.4 MG SL tablet Place 1 tablet (0.4 mg total) under the tongue every 5 (five) minutes as needed for chest pain. May take up to 3 doses 30 tablet 2   omeprazole (PRILOSEC) 40 MG capsule Take 1 capsule (40 mg total) by mouth daily. 30 capsule 3   TRULICITY 5.00 BB/0.4UG SOPN Inject 0.75 mg into the skin once a week. Wednesday 1 mL 4   lisinopril (ZESTRIL) 40 MG tablet Take 1 tablet (40 mg total) by mouth daily. 30 tablet 0   warfarin (COUMADIN) 6 MG tablet Take 1 tablet (6 mg total) by mouth at bedtime. 30 tablet 3   blood glucose meter kit and supplies KIT Dispense based on patient and insurance preference. Use up to four times daily as directed. (FOR ICD-9 250.00, 250.01). 1 each 0   No facility-administered medications prior to visit.    No Known Allergies  ROS Review of Systems  Constitutional: Negative.   Eyes: Negative.   Respiratory: Negative.   Cardiovascular: Negative.   Endocrine: Negative.   Skin: Negative.   Neurological: Negative.   Hematological: Negative.       Objective:    Physical Exam HENT:     Head: Normocephalic and atraumatic.  Eyes:     Extraocular Movements: Extraocular movements intact.     Conjunctiva/sclera: Conjunctivae normal.     Pupils: Pupils are equal, round, and reactive to light.   Cardiovascular:     Rate and Rhythm: Normal rate and regular rhythm.     Pulses: Normal pulses.     Heart sounds: Normal heart sounds.  Pulmonary:     Effort: Pulmonary effort is normal.     Breath sounds: Normal breath sounds.  Skin:    General: Skin is warm.  Neurological:     General: No focal deficit present.     Mental Status: He is alert and oriented to person, place, and time. Mental status is at baseline.  Psychiatric:        Mood and Affect: Mood normal.        Behavior: Behavior normal.  Thought Content: Thought content normal.        Judgment: Judgment normal.     BP 117/61 (BP Location: Left Arm, Patient Position: Sitting, Cuff Size: Large)    Pulse 62    Temp 98 F (36.7 C)    Resp 16    Wt 173 lb 14.4 oz (78.9 kg)    SpO2 98%    BMI 30.81 kg/m  Wt Readings from Last 3 Encounters:  05/18/20 173 lb 14.4 oz (78.9 kg)  04/08/20 175 lb 12.8 oz (79.7 kg)  03/25/20 173 lb 14.4 oz (78.9 kg)     Health Maintenance Due  Topic Date Due   Hepatitis C Screening  Never done   FOOT EXAM  Never done   OPHTHALMOLOGY EXAM  Never done   COVID-19 Vaccine (1) Never done   TETANUS/TDAP  Never done   COLONOSCOPY  Never done    There are no preventive care reminders to display for this patient.  Lab Results  Component Value Date   TSH 3.637 10/03/2019   Lab Results  Component Value Date   WBC 5.5 02/25/2020   HGB 10.9 (L) 02/25/2020   HCT 34.8 (L) 02/25/2020   MCV 85 02/25/2020   PLT 194 02/25/2020   Lab Results  Component Value Date   NA 138 02/25/2020   K 4.0 02/25/2020   CO2 25 02/25/2020   GLUCOSE 330 (H) 02/25/2020   BUN 20 02/25/2020   CREATININE 1.17 02/25/2020   BILITOT 0.3 02/25/2020   ALKPHOS 89 02/25/2020   AST 18 02/25/2020   ALT 18 02/25/2020   PROT 7.2 02/25/2020   ALBUMIN 4.1 02/25/2020   CALCIUM 9.3 02/25/2020   ANIONGAP 13 10/08/2019   Lab Results  Component Value Date   CHOL 143 02/25/2020   Lab Results  Component Value  Date   HDL 31 (L) 02/25/2020   Lab Results  Component Value Date   LDLCALC 78 02/25/2020   Lab Results  Component Value Date   TRIG 200 (H) 02/25/2020   Lab Results  Component Value Date   CHOLHDL 4.6 02/25/2020   Lab Results  Component Value Date   HGBA1C 7.4 (H) 05/12/2020      Assessment & Plan:     1. Essential hypertension - His blood pressure is under control, he will continue on current treatment regimen, DASH diet. - lisinopril ZESTRIL) 40 MG tablet; Take 1 tablet (40 mg total) by mouth daily.  Dispense: 30 tablet; Refill: 1  2. Alteration in anticoagulation -His INR is within limit, he will continue on current dose, was advised to notify clinic with bruises, hematuria, hematochezia and active bleeding. - warfarin (COUMADIN) 6 MG tablet; Take 1 tablet (6 mg total) by mouth at bedtime.  Dispense: 30 tablet; Refill: 3  3. Type 2 diabetes mellitus without complication, unspecified whether long term insulin use (HCC) - His HgbA1c was 7.4%, his goal should be less than 7%. He was advised to check his blood glucose bid, record and bring log to follow up appointment. He will continue on low carb/non concentrated sweet diet.   Follow-up: He has no follow up appointment, Medicare will be active January 1st. [facility] wishes Mr Claiborne well in his care.    Jenille Laszlo Jerold Coombe, NP

## 2020-05-24 ENCOUNTER — Ambulatory Visit (INDEPENDENT_AMBULATORY_CARE_PROVIDER_SITE_OTHER): Payer: Self-pay

## 2020-05-24 DIAGNOSIS — I471 Supraventricular tachycardia: Secondary | ICD-10-CM

## 2020-05-24 LAB — CUP PACEART REMOTE DEVICE CHECK
Date Time Interrogation Session: 20211219173959
Implantable Pulse Generator Implant Date: 20210503

## 2020-06-07 NOTE — Progress Notes (Signed)
Carelink Summary Report / Loop Recorder 

## 2020-06-14 DIAGNOSIS — R569 Unspecified convulsions: Secondary | ICD-10-CM | POA: Diagnosis not present

## 2020-06-14 DIAGNOSIS — I10 Essential (primary) hypertension: Secondary | ICD-10-CM | POA: Diagnosis not present

## 2020-06-14 DIAGNOSIS — I779 Disorder of arteries and arterioles, unspecified: Secondary | ICD-10-CM | POA: Diagnosis not present

## 2020-06-14 DIAGNOSIS — E7800 Pure hypercholesterolemia, unspecified: Secondary | ICD-10-CM | POA: Diagnosis not present

## 2020-06-14 DIAGNOSIS — E1140 Type 2 diabetes mellitus with diabetic neuropathy, unspecified: Secondary | ICD-10-CM | POA: Diagnosis not present

## 2020-06-14 DIAGNOSIS — K449 Diaphragmatic hernia without obstruction or gangrene: Secondary | ICD-10-CM | POA: Diagnosis not present

## 2020-06-14 DIAGNOSIS — E782 Mixed hyperlipidemia: Secondary | ICD-10-CM | POA: Diagnosis not present

## 2020-06-14 DIAGNOSIS — Z86711 Personal history of pulmonary embolism: Secondary | ICD-10-CM | POA: Diagnosis not present

## 2020-06-17 DIAGNOSIS — M79643 Pain in unspecified hand: Secondary | ICD-10-CM | POA: Insufficient documentation

## 2020-06-17 DIAGNOSIS — R29818 Other symptoms and signs involving the nervous system: Secondary | ICD-10-CM | POA: Diagnosis not present

## 2020-06-17 DIAGNOSIS — M25642 Stiffness of left hand, not elsewhere classified: Secondary | ICD-10-CM | POA: Insufficient documentation

## 2020-06-17 DIAGNOSIS — R569 Unspecified convulsions: Secondary | ICD-10-CM | POA: Diagnosis not present

## 2020-06-17 DIAGNOSIS — M79646 Pain in unspecified finger(s): Secondary | ICD-10-CM | POA: Diagnosis not present

## 2020-06-24 ENCOUNTER — Ambulatory Visit (INDEPENDENT_AMBULATORY_CARE_PROVIDER_SITE_OTHER): Payer: Self-pay

## 2020-06-24 DIAGNOSIS — I471 Supraventricular tachycardia: Secondary | ICD-10-CM

## 2020-06-26 LAB — CUP PACEART REMOTE DEVICE CHECK
Date Time Interrogation Session: 20220121174122
Implantable Pulse Generator Implant Date: 20210503

## 2020-07-06 DIAGNOSIS — H6123 Impacted cerumen, bilateral: Secondary | ICD-10-CM | POA: Diagnosis not present

## 2020-07-06 DIAGNOSIS — J300 Vasomotor rhinitis: Secondary | ICD-10-CM | POA: Diagnosis not present

## 2020-07-06 DIAGNOSIS — H903 Sensorineural hearing loss, bilateral: Secondary | ICD-10-CM | POA: Diagnosis not present

## 2020-07-07 NOTE — Progress Notes (Signed)
Carelink Summary Report / Loop Recorder 

## 2020-07-13 ENCOUNTER — Other Ambulatory Visit: Payer: Self-pay

## 2020-07-13 ENCOUNTER — Ambulatory Visit (INDEPENDENT_AMBULATORY_CARE_PROVIDER_SITE_OTHER): Payer: Medicare HMO

## 2020-07-13 ENCOUNTER — Ambulatory Visit (INDEPENDENT_AMBULATORY_CARE_PROVIDER_SITE_OTHER): Payer: Medicare HMO | Admitting: Vascular Surgery

## 2020-07-13 VITALS — BP 136/78 | HR 98 | Ht 63.0 in | Wt 176.0 lb

## 2020-07-13 DIAGNOSIS — I6523 Occlusion and stenosis of bilateral carotid arteries: Secondary | ICD-10-CM

## 2020-07-13 DIAGNOSIS — I10 Essential (primary) hypertension: Secondary | ICD-10-CM | POA: Diagnosis not present

## 2020-07-13 DIAGNOSIS — E119 Type 2 diabetes mellitus without complications: Secondary | ICD-10-CM | POA: Diagnosis not present

## 2020-07-13 DIAGNOSIS — Z8673 Personal history of transient ischemic attack (TIA), and cerebral infarction without residual deficits: Secondary | ICD-10-CM | POA: Diagnosis not present

## 2020-07-13 DIAGNOSIS — I2510 Atherosclerotic heart disease of native coronary artery without angina pectoris: Secondary | ICD-10-CM

## 2020-07-13 NOTE — Assessment & Plan Note (Signed)
Carotid duplex today reveals stable 1 to 39% ICA stenosis bilaterally with plaque seen on each side but no hemodynamically significant stenosis.  No role for intervention at this level.  Continue current medical regimen.  Recheck in 1 year.
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
## 2020-07-16 DIAGNOSIS — R569 Unspecified convulsions: Secondary | ICD-10-CM | POA: Diagnosis not present

## 2020-07-16 DIAGNOSIS — R791 Abnormal coagulation profile: Secondary | ICD-10-CM | POA: Diagnosis not present

## 2020-07-26 ENCOUNTER — Ambulatory Visit (INDEPENDENT_AMBULATORY_CARE_PROVIDER_SITE_OTHER): Payer: Self-pay

## 2020-07-26 DIAGNOSIS — I471 Supraventricular tachycardia, unspecified: Secondary | ICD-10-CM

## 2020-07-29 LAB — CUP PACEART REMOTE DEVICE CHECK
Date Time Interrogation Session: 20220223173757
Implantable Pulse Generator Implant Date: 20210503

## 2020-07-30 NOTE — Progress Notes (Signed)
Carelink Summary Report / Loop Recorder 

## 2020-08-09 DIAGNOSIS — R569 Unspecified convulsions: Secondary | ICD-10-CM | POA: Diagnosis not present

## 2020-08-10 ENCOUNTER — Other Ambulatory Visit: Payer: Self-pay | Admitting: Gerontology

## 2020-08-10 DIAGNOSIS — E119 Type 2 diabetes mellitus without complications: Secondary | ICD-10-CM

## 2020-08-25 DIAGNOSIS — H524 Presbyopia: Secondary | ICD-10-CM | POA: Diagnosis not present

## 2020-08-25 DIAGNOSIS — Z0100 Encounter for examination of eyes and vision without abnormal findings: Secondary | ICD-10-CM | POA: Diagnosis not present

## 2020-08-26 ENCOUNTER — Ambulatory Visit (INDEPENDENT_AMBULATORY_CARE_PROVIDER_SITE_OTHER): Payer: Self-pay

## 2020-08-26 DIAGNOSIS — I471 Supraventricular tachycardia: Secondary | ICD-10-CM

## 2020-08-31 LAB — CUP PACEART REMOTE DEVICE CHECK
Date Time Interrogation Session: 20220328173856
Implantable Pulse Generator Implant Date: 20210503

## 2020-09-06 NOTE — Progress Notes (Signed)
Carelink Summary Report / Loop Recorder 

## 2020-09-07 ENCOUNTER — Other Ambulatory Visit: Payer: Self-pay

## 2020-09-13 DIAGNOSIS — J209 Acute bronchitis, unspecified: Secondary | ICD-10-CM | POA: Diagnosis not present

## 2020-09-13 DIAGNOSIS — Z7901 Long term (current) use of anticoagulants: Secondary | ICD-10-CM | POA: Diagnosis not present

## 2020-09-13 DIAGNOSIS — B9689 Other specified bacterial agents as the cause of diseases classified elsewhere: Secondary | ICD-10-CM | POA: Diagnosis not present

## 2020-09-13 DIAGNOSIS — J019 Acute sinusitis, unspecified: Secondary | ICD-10-CM | POA: Diagnosis not present

## 2020-10-04 ENCOUNTER — Ambulatory Visit (INDEPENDENT_AMBULATORY_CARE_PROVIDER_SITE_OTHER): Payer: Self-pay

## 2020-10-04 DIAGNOSIS — Z95818 Presence of other cardiac implants and grafts: Secondary | ICD-10-CM

## 2020-10-05 LAB — CUP PACEART REMOTE DEVICE CHECK
Date Time Interrogation Session: 20220430173830
Implantable Pulse Generator Implant Date: 20210503

## 2020-10-08 ENCOUNTER — Telehealth: Payer: Self-pay | Admitting: Emergency Medicine

## 2020-10-08 NOTE — Telephone Encounter (Signed)
Made contact patient about an alert received on 10/08/20 for a tachy event that lasted 27 beats. Rates show 207 BPM per [facility] Alert. Assessed patient. Patient denies any chest pain or shortness of breath and states that he feels fine. Patient states that he has an upcoming appointment with his cardiologist. I advised that I would send the transmission to Dr. Ladona Ridgel just as [place]ain Childes.

## 2020-10-11 DIAGNOSIS — M25641 Stiffness of right hand, not elsewhere classified: Secondary | ICD-10-CM | POA: Diagnosis not present

## 2020-10-11 DIAGNOSIS — M79641 Pain in right hand: Secondary | ICD-10-CM | POA: Diagnosis not present

## 2020-10-11 DIAGNOSIS — Q245 Malformation of coronary vessels: Secondary | ICD-10-CM | POA: Diagnosis not present

## 2020-10-11 DIAGNOSIS — M79643 Pain in unspecified hand: Secondary | ICD-10-CM | POA: Diagnosis not present

## 2020-10-11 DIAGNOSIS — R0789 Other chest pain: Secondary | ICD-10-CM | POA: Diagnosis not present

## 2020-10-11 DIAGNOSIS — Z86711 Personal history of pulmonary embolism: Secondary | ICD-10-CM | POA: Diagnosis not present

## 2020-10-11 DIAGNOSIS — M79646 Pain in unspecified finger(s): Secondary | ICD-10-CM | POA: Diagnosis not present

## 2020-10-11 DIAGNOSIS — Z7901 Long term (current) use of anticoagulants: Secondary | ICD-10-CM | POA: Diagnosis not present

## 2020-10-11 DIAGNOSIS — I350 Nonrheumatic aortic (valve) stenosis: Secondary | ICD-10-CM | POA: Diagnosis not present

## 2020-10-11 DIAGNOSIS — I10 Essential (primary) hypertension: Secondary | ICD-10-CM | POA: Diagnosis not present

## 2020-10-11 DIAGNOSIS — M79642 Pain in left hand: Secondary | ICD-10-CM | POA: Diagnosis not present

## 2020-10-11 DIAGNOSIS — R29818 Other symptoms and signs involving the nervous system: Secondary | ICD-10-CM | POA: Diagnosis not present

## 2020-10-11 DIAGNOSIS — M25642 Stiffness of left hand, not elsewhere classified: Secondary | ICD-10-CM | POA: Diagnosis not present

## 2020-10-11 DIAGNOSIS — R569 Unspecified convulsions: Secondary | ICD-10-CM | POA: Diagnosis not present

## 2020-10-12 DIAGNOSIS — I779 Disorder of arteries and arterioles, unspecified: Secondary | ICD-10-CM | POA: Diagnosis not present

## 2020-10-12 DIAGNOSIS — E118 Type 2 diabetes mellitus with unspecified complications: Secondary | ICD-10-CM | POA: Diagnosis not present

## 2020-10-12 DIAGNOSIS — Z23 Encounter for immunization: Secondary | ICD-10-CM | POA: Diagnosis not present

## 2020-10-12 DIAGNOSIS — I10 Essential (primary) hypertension: Secondary | ICD-10-CM | POA: Diagnosis not present

## 2020-10-12 DIAGNOSIS — R569 Unspecified convulsions: Secondary | ICD-10-CM | POA: Diagnosis not present

## 2020-10-12 DIAGNOSIS — Z125 Encounter for screening for malignant neoplasm of prostate: Secondary | ICD-10-CM | POA: Diagnosis not present

## 2020-10-12 DIAGNOSIS — Z0000 Encounter for general adult medical examination without abnormal findings: Secondary | ICD-10-CM | POA: Diagnosis not present

## 2020-10-20 DIAGNOSIS — U071 COVID-19: Secondary | ICD-10-CM | POA: Diagnosis not present

## 2020-10-20 DIAGNOSIS — R059 Cough, unspecified: Secondary | ICD-10-CM | POA: Diagnosis not present

## 2020-10-22 NOTE — Progress Notes (Signed)
Carelink Summary Report / Loop Recorder 

## 2020-11-04 ENCOUNTER — Ambulatory Visit (INDEPENDENT_AMBULATORY_CARE_PROVIDER_SITE_OTHER): Payer: Self-pay

## 2020-11-04 DIAGNOSIS — Z95818 Presence of other cardiac implants and grafts: Secondary | ICD-10-CM

## 2020-11-06 LAB — CUP PACEART REMOTE DEVICE CHECK
Date Time Interrogation Session: 20220602173621
Implantable Pulse Generator Implant Date: 20210503

## 2020-11-26 NOTE — Progress Notes (Signed)
Carelink Summary Report / Loop Recorder 

## 2020-12-07 ENCOUNTER — Ambulatory Visit (INDEPENDENT_AMBULATORY_CARE_PROVIDER_SITE_OTHER): Payer: Self-pay

## 2020-12-07 DIAGNOSIS — Z95818 Presence of other cardiac implants and grafts: Secondary | ICD-10-CM

## 2020-12-08 DIAGNOSIS — Z7901 Long term (current) use of anticoagulants: Secondary | ICD-10-CM | POA: Diagnosis not present

## 2020-12-08 LAB — CUP PACEART REMOTE DEVICE CHECK
Date Time Interrogation Session: 20220705173658
Implantable Pulse Generator Implant Date: 20210503

## 2020-12-10 ENCOUNTER — Other Ambulatory Visit: Payer: Self-pay

## 2020-12-24 NOTE — Progress Notes (Signed)
Carelink Summary Report / Loop Recorder 

## 2020-12-27 ENCOUNTER — Other Ambulatory Visit: Payer: Self-pay

## 2021-01-06 ENCOUNTER — Ambulatory Visit (INDEPENDENT_AMBULATORY_CARE_PROVIDER_SITE_OTHER): Payer: Medicare HMO

## 2021-01-06 DIAGNOSIS — I471 Supraventricular tachycardia: Secondary | ICD-10-CM | POA: Diagnosis not present

## 2021-01-10 LAB — CUP PACEART REMOTE DEVICE CHECK
Date Time Interrogation Session: 20220807173843
Implantable Pulse Generator Implant Date: 20210503

## 2021-01-12 DIAGNOSIS — Z7901 Long term (current) use of anticoagulants: Secondary | ICD-10-CM | POA: Diagnosis not present

## 2021-01-12 DIAGNOSIS — I350 Nonrheumatic aortic (valve) stenosis: Secondary | ICD-10-CM | POA: Diagnosis not present

## 2021-01-12 DIAGNOSIS — Q245 Malformation of coronary vessels: Secondary | ICD-10-CM | POA: Diagnosis not present

## 2021-01-12 DIAGNOSIS — R29818 Other symptoms and signs involving the nervous system: Secondary | ICD-10-CM | POA: Diagnosis not present

## 2021-01-12 DIAGNOSIS — R569 Unspecified convulsions: Secondary | ICD-10-CM | POA: Diagnosis not present

## 2021-01-12 DIAGNOSIS — I2510 Atherosclerotic heart disease of native coronary artery without angina pectoris: Secondary | ICD-10-CM | POA: Diagnosis not present

## 2021-01-12 DIAGNOSIS — M79643 Pain in unspecified hand: Secondary | ICD-10-CM | POA: Diagnosis not present

## 2021-01-12 DIAGNOSIS — M79646 Pain in unspecified finger(s): Secondary | ICD-10-CM | POA: Diagnosis not present

## 2021-01-12 DIAGNOSIS — I10 Essential (primary) hypertension: Secondary | ICD-10-CM | POA: Diagnosis not present

## 2021-01-12 DIAGNOSIS — M25641 Stiffness of right hand, not elsewhere classified: Secondary | ICD-10-CM | POA: Diagnosis not present

## 2021-01-12 DIAGNOSIS — R0602 Shortness of breath: Secondary | ICD-10-CM | POA: Diagnosis not present

## 2021-01-12 DIAGNOSIS — M25642 Stiffness of left hand, not elsewhere classified: Secondary | ICD-10-CM | POA: Diagnosis not present

## 2021-01-12 DIAGNOSIS — I779 Disorder of arteries and arterioles, unspecified: Secondary | ICD-10-CM | POA: Diagnosis not present

## 2021-01-19 DIAGNOSIS — I2510 Atherosclerotic heart disease of native coronary artery without angina pectoris: Secondary | ICD-10-CM | POA: Diagnosis not present

## 2021-01-28 NOTE — Progress Notes (Signed)
Carelink Summary Report / Loop Recorder 

## 2021-02-04 DIAGNOSIS — R0602 Shortness of breath: Secondary | ICD-10-CM | POA: Diagnosis not present

## 2021-02-04 DIAGNOSIS — R21 Rash and other nonspecific skin eruption: Secondary | ICD-10-CM | POA: Diagnosis not present

## 2021-02-04 DIAGNOSIS — L819 Disorder of pigmentation, unspecified: Secondary | ICD-10-CM | POA: Diagnosis not present

## 2021-02-14 ENCOUNTER — Ambulatory Visit (INDEPENDENT_AMBULATORY_CARE_PROVIDER_SITE_OTHER): Payer: Medicare HMO

## 2021-02-14 DIAGNOSIS — I471 Supraventricular tachycardia: Secondary | ICD-10-CM

## 2021-02-14 LAB — CUP PACEART REMOTE DEVICE CHECK
Date Time Interrogation Session: 20220909173534
Implantable Pulse Generator Implant Date: 20210503

## 2021-02-15 DIAGNOSIS — R569 Unspecified convulsions: Secondary | ICD-10-CM | POA: Diagnosis not present

## 2021-02-15 DIAGNOSIS — E7800 Pure hypercholesterolemia, unspecified: Secondary | ICD-10-CM | POA: Diagnosis not present

## 2021-02-15 DIAGNOSIS — Z23 Encounter for immunization: Secondary | ICD-10-CM | POA: Diagnosis not present

## 2021-02-15 DIAGNOSIS — I779 Disorder of arteries and arterioles, unspecified: Secondary | ICD-10-CM | POA: Diagnosis not present

## 2021-02-15 DIAGNOSIS — I10 Essential (primary) hypertension: Secondary | ICD-10-CM | POA: Diagnosis not present

## 2021-02-15 DIAGNOSIS — Z86711 Personal history of pulmonary embolism: Secondary | ICD-10-CM | POA: Diagnosis not present

## 2021-02-15 DIAGNOSIS — E119 Type 2 diabetes mellitus without complications: Secondary | ICD-10-CM | POA: Diagnosis not present

## 2021-02-18 NOTE — Progress Notes (Signed)
Carelink Summary Report / Loop Recorder 

## 2021-02-28 ENCOUNTER — Other Ambulatory Visit: Payer: Self-pay

## 2021-02-28 ENCOUNTER — Other Ambulatory Visit: Payer: Self-pay | Admitting: Podiatry

## 2021-02-28 ENCOUNTER — Ambulatory Visit (INDEPENDENT_AMBULATORY_CARE_PROVIDER_SITE_OTHER): Payer: Medicare HMO | Admitting: Podiatry

## 2021-02-28 ENCOUNTER — Encounter: Payer: Self-pay | Admitting: Podiatry

## 2021-02-28 ENCOUNTER — Ambulatory Visit (INDEPENDENT_AMBULATORY_CARE_PROVIDER_SITE_OTHER): Payer: Medicare HMO

## 2021-02-28 DIAGNOSIS — M778 Other enthesopathies, not elsewhere classified: Secondary | ICD-10-CM

## 2021-02-28 DIAGNOSIS — Q245 Malformation of coronary vessels: Secondary | ICD-10-CM | POA: Diagnosis not present

## 2021-02-28 DIAGNOSIS — I779 Disorder of arteries and arterioles, unspecified: Secondary | ICD-10-CM | POA: Diagnosis not present

## 2021-02-28 DIAGNOSIS — R000 Tachycardia, unspecified: Secondary | ICD-10-CM | POA: Diagnosis not present

## 2021-02-28 DIAGNOSIS — I350 Nonrheumatic aortic (valve) stenosis: Secondary | ICD-10-CM | POA: Diagnosis not present

## 2021-02-28 DIAGNOSIS — M7751 Other enthesopathy of right foot: Secondary | ICD-10-CM | POA: Diagnosis not present

## 2021-02-28 DIAGNOSIS — I2510 Atherosclerotic heart disease of native coronary artery without angina pectoris: Secondary | ICD-10-CM

## 2021-02-28 DIAGNOSIS — I10 Essential (primary) hypertension: Secondary | ICD-10-CM | POA: Diagnosis not present

## 2021-02-28 DIAGNOSIS — E118 Type 2 diabetes mellitus with unspecified complications: Secondary | ICD-10-CM | POA: Diagnosis not present

## 2021-02-28 DIAGNOSIS — Z86711 Personal history of pulmonary embolism: Secondary | ICD-10-CM | POA: Diagnosis not present

## 2021-02-28 DIAGNOSIS — I471 Supraventricular tachycardia: Secondary | ICD-10-CM | POA: Diagnosis not present

## 2021-02-28 MED ORDER — TRIAMCINOLONE ACETONIDE 40 MG/ML IJ SUSP
20.0000 mg | Freq: Once | INTRAMUSCULAR | Status: AC
  Administered 2021-02-28: 20 mg

## 2021-02-28 NOTE — Progress Notes (Signed)
Subjective:  Patient ID: PRABHJOT PISCITELLO, male    DOB: Aug 28, 1955,  MRN: [id number] HPI Chief Complaint  Patient presents with   Foot Pain    Lateral side right - aching x 1 week, better now   New Patient (Initial Visit)    Est pt 2016    65 y.o. male presents with the above complaint.   ROS: Denies fever chills nausea vomiting muscle aches pains calf pain back pain chest pain shortness of breath.  Past Medical History:  Diagnosis Date   CAD (coronary artery disease) of artery bypass graft    s/p CABG x 4 in 1997   COPD (chronic obstructive pulmonary disease) ([facility])    Coronary artery disease    CVA (cerebral vascular accident) ([place])    Diabetes mellitus without complication ([place])    GERD (gastroesophageal reflux disease)    History of single vessel coronary artery bypass    HLD (hyperlipidemia)    Hypertension    Seizures ([place])    Past Surgical History:  Procedure Laterality Date   CARDIAC SURGERY     CORNEAL TRANSPLANT Right    CORONARY ARTERY BYPASS GRAFT  1997   EYE SURGERY     FLEXOR TENDON REPAIR Left 07/11/2019   Procedure: FLEXOR tenolysis  REPAIR LEFT RING FINGER with tednon repair;  Surgeon: Hessie Knows, MD;  Location: [facility];  Service: Orthopedics;  Laterality: Left;   INCISION AND DRAINAGE ABSCESS Left 05/08/2019   Procedure: INCISION AND DRAINAGE ABSCESS;  Surgeon: Dereck Leep, MD;  Location: [facility];  Service: Orthopedics;  Laterality: Left;   LEFT HEART CATH AND CORS/GRAFTS ANGIOGRAPHY N/A 10/02/2019   Procedure: LEFT HEART CATH AND CORS/GRAFTS ANGIOGRAPHY;  Surgeon: Leonie Man, MD;  Location: [facility];  Service: Cardiovascular;  Laterality: N/A;   LOOP RECORDER INSERTION N/A 10/06/2019   Procedure: LOOP RECORDER INSERTION;  Surgeon: Evans Lance, MD;  Location: [facility];  Service: Cardiovascular;  Laterality: N/A;    Current Outpatient Medications:    nitroGLYCERIN (NITROSTAT) 0.4 MG SL tablet, Place under the tongue., Disp:  , Rfl:    atorvastatin (LIPITOR) 40 MG tablet, TAKE ONE TABLET BY MOUTH AT BEDTIME, Disp: 30 tablet, Rfl: 3   blood glucose meter kit and supplies KIT, Dispense based on patient and insurance preference. Use up to four times daily as directed. (FOR ICD-9 250.00, 250.01)., Disp: 1 each, Rfl: 0   calcium citrate-vitamin D (CITRACAL+D) 315-200 MG-UNIT tablet, Take 1 tablet by mouth 2 (two) times daily., Disp: , Rfl:    Cysteamine Bitartrate (PROCYSBI) 300 MG PACK, as directed And strips and lancets for qd check for dm ii, Disp: , Rfl:    divalproex (DEPAKOTE) 250 MG DR tablet, , Disp: , Rfl:    glipiZIDE (GLUCOTROL XL) 10 MG 24 hr tablet, TAKE ONE TABLET BY MOUTH 2 TIMES A DAY, Disp: 60 tablet, Rfl: 3   ipratropium (ATROVENT) 0.06 % nasal spray, , Disp: , Rfl:    isosorbide mononitrate (IMDUR) 30 MG 24 hr tablet, , Disp: , Rfl:    lisinopril (ZESTRIL) 40 MG tablet, TAKE ONE TABLET BY MOUTH EVERY DAY, Disp: 30 tablet, Rfl: 1   metFORMIN (GLUCOPHAGE-XR) 500 MG 24 hr tablet, TAKE TWO TABLETS BY MOUTH 2 TIMES A DAY, Disp: 120 tablet, Rfl: 3   metoprolol succinate (TOPROL-XL) 100 MG 24 hr tablet, Take 1 tablet (100 mg total) by mouth daily., Disp: 30 tablet, Rfl: 3   Multiple Vitamin (MULTIVITAMIN) capsule, Take  1 capsule by mouth daily., Disp: , Rfl:    omeprazole (PRILOSEC) 40 MG capsule, TAKE ONE CAPSULE BY MOUTH EVERY DAY, Disp: 30 capsule, Rfl: 3   ramipril (ALTACE) 10 MG capsule, Take by mouth., Disp: , Rfl:    TRULICITY 3.52 YE/1.8HT SOPN, Inject 0.75 mg into the skin once a week. Wednesday, Disp: 1 mL, Rfl: 4   XARELTO 20 MG TABS tablet, , Disp: , Rfl:   No Known Allergies Review of Systems Objective:  There were no vitals filed for this visit.  General: Well developed, nourished, in no acute distress, alert and oriented x3   Dermatological: Skin is warm, dry and supple bilateral. Nails x 10 are well maintained; remaining integument appears unremarkable at this time. There are no open sores,  no preulcerative lesions, no rash or signs of infection present.  Vascular: Dorsalis Pedis artery and Posterior Tibial artery pedal pulses are 2/4 bilateral with immedate capillary fill time. Pedal hair growth present. No varicosities and no lower extremity edema present bilateral.   Neruologic: Grossly intact via light touch bilateral. Vibratory intact via tuning fork bilateral. Protective threshold with Semmes Wienstein monofilament intact to all pedal sites bilateral. Patellar and Achilles deep tendon reflexes 2+ bilateral. No Babinski or clonus noted bilateral.   Musculoskeletal: No gross boney pedal deformities bilateral. No pain, crepitus, or limitation noted with foot and ankle range of motion bilateral. Muscular strength 5/5 in all groups tested bilateral.  Pain on palpation and end range of motion of the sinus tarsi and subtalar joint right foot.  Gait: Unassisted, Nonantalgic.    Radiographs:  Radiographs taken today demonstrate significant osteoarthritic changes and spurring throughout the entire foot right.  Also demonstrates medial calcific sclerosis of the major arteries.  Assessment & Plan:   Assessment: Osteoarthritis capsulitis subtalar joint right  Plan: Discussed etiology pathology conservative surgical therapies at this point I injected 20 mg Kenalog 5 mg Marcaine point of maximal tenderness.  Tolerated procedure well without complications follow-up with me as needed.     Trystyn Dolley T. [place], [place]

## 2021-03-03 DIAGNOSIS — Q245 Malformation of coronary vessels: Secondary | ICD-10-CM | POA: Diagnosis not present

## 2021-03-03 DIAGNOSIS — I471 Supraventricular tachycardia: Secondary | ICD-10-CM | POA: Diagnosis not present

## 2021-03-03 DIAGNOSIS — I214 Non-ST elevation (NSTEMI) myocardial infarction: Secondary | ICD-10-CM | POA: Diagnosis not present

## 2021-03-03 DIAGNOSIS — Z7901 Long term (current) use of anticoagulants: Secondary | ICD-10-CM | POA: Diagnosis not present

## 2021-03-03 DIAGNOSIS — R000 Tachycardia, unspecified: Secondary | ICD-10-CM | POA: Diagnosis not present

## 2021-03-03 DIAGNOSIS — I10 Essential (primary) hypertension: Secondary | ICD-10-CM | POA: Diagnosis not present

## 2021-03-03 DIAGNOSIS — I350 Nonrheumatic aortic (valve) stenosis: Secondary | ICD-10-CM | POA: Diagnosis not present

## 2021-03-03 DIAGNOSIS — Z86711 Personal history of pulmonary embolism: Secondary | ICD-10-CM | POA: Diagnosis not present

## 2021-03-21 ENCOUNTER — Ambulatory Visit (INDEPENDENT_AMBULATORY_CARE_PROVIDER_SITE_OTHER): Payer: Medicare HMO

## 2021-03-21 DIAGNOSIS — I471 Supraventricular tachycardia: Secondary | ICD-10-CM

## 2021-03-22 LAB — CUP PACEART REMOTE DEVICE CHECK
Date Time Interrogation Session: 20221012173642
Implantable Pulse Generator Implant Date: 20210503

## 2021-03-29 NOTE — Progress Notes (Signed)
Carelink Summary Report / Loop Recorder 

## 2021-03-31 DIAGNOSIS — Z86711 Personal history of pulmonary embolism: Secondary | ICD-10-CM | POA: Diagnosis not present

## 2021-03-31 DIAGNOSIS — R000 Tachycardia, unspecified: Secondary | ICD-10-CM | POA: Diagnosis not present

## 2021-03-31 DIAGNOSIS — I10 Essential (primary) hypertension: Secondary | ICD-10-CM | POA: Diagnosis not present

## 2021-03-31 DIAGNOSIS — I2510 Atherosclerotic heart disease of native coronary artery without angina pectoris: Secondary | ICD-10-CM | POA: Diagnosis not present

## 2021-03-31 DIAGNOSIS — I350 Nonrheumatic aortic (valve) stenosis: Secondary | ICD-10-CM | POA: Diagnosis not present

## 2021-03-31 DIAGNOSIS — I779 Disorder of arteries and arterioles, unspecified: Secondary | ICD-10-CM | POA: Diagnosis not present

## 2021-03-31 DIAGNOSIS — I471 Supraventricular tachycardia: Secondary | ICD-10-CM | POA: Diagnosis not present

## 2021-03-31 DIAGNOSIS — Q245 Malformation of coronary vessels: Secondary | ICD-10-CM | POA: Diagnosis not present

## 2021-03-31 DIAGNOSIS — I214 Non-ST elevation (NSTEMI) myocardial infarction: Secondary | ICD-10-CM | POA: Diagnosis not present

## 2021-04-23 ENCOUNTER — Emergency Department: Payer: Medicare HMO

## 2021-04-23 ENCOUNTER — Inpatient Hospital Stay: Payer: Medicare HMO

## 2021-04-23 ENCOUNTER — Other Ambulatory Visit: Payer: Self-pay

## 2021-04-23 ENCOUNTER — Inpatient Hospital Stay
Admission: EM | Admit: 2021-04-23 | Discharge: 2021-04-27 | DRG: 280 | Disposition: A | Discharge status: Home / Self Care | Payer: Medicare HMO | Attending: Internal Medicine | Admitting: Internal Medicine

## 2021-04-23 DIAGNOSIS — R946 Abnormal results of thyroid function studies: Secondary | ICD-10-CM | POA: Diagnosis present

## 2021-04-23 DIAGNOSIS — Z7901 Long term (current) use of anticoagulants: Secondary | ICD-10-CM

## 2021-04-23 DIAGNOSIS — H9193 Unspecified hearing loss, bilateral: Secondary | ICD-10-CM | POA: Diagnosis present

## 2021-04-23 DIAGNOSIS — I517 Cardiomegaly: Secondary | ICD-10-CM | POA: Diagnosis not present

## 2021-04-23 DIAGNOSIS — J9601 Acute respiratory failure with hypoxia: Secondary | ICD-10-CM | POA: Diagnosis present

## 2021-04-23 DIAGNOSIS — E669 Obesity, unspecified: Secondary | ICD-10-CM | POA: Diagnosis present

## 2021-04-23 DIAGNOSIS — Z86711 Personal history of pulmonary embolism: Secondary | ICD-10-CM | POA: Diagnosis not present

## 2021-04-23 DIAGNOSIS — E7800 Pure hypercholesterolemia, unspecified: Secondary | ICD-10-CM | POA: Diagnosis present

## 2021-04-23 DIAGNOSIS — Z20822 Contact with and (suspected) exposure to covid-19: Secondary | ICD-10-CM | POA: Diagnosis present

## 2021-04-23 DIAGNOSIS — I25810 Atherosclerosis of coronary artery bypass graft(s) without angina pectoris: Secondary | ICD-10-CM | POA: Diagnosis present

## 2021-04-23 DIAGNOSIS — E785 Hyperlipidemia, unspecified: Secondary | ICD-10-CM | POA: Diagnosis present

## 2021-04-23 DIAGNOSIS — I10 Essential (primary) hypertension: Secondary | ICD-10-CM | POA: Diagnosis present

## 2021-04-23 DIAGNOSIS — K219 Gastro-esophageal reflux disease without esophagitis: Secondary | ICD-10-CM | POA: Diagnosis present

## 2021-04-23 DIAGNOSIS — I110 Hypertensive heart disease with heart failure: Secondary | ICD-10-CM | POA: Diagnosis present

## 2021-04-23 DIAGNOSIS — Z4659 Encounter for fitting and adjustment of other gastrointestinal appliance and device: Secondary | ICD-10-CM

## 2021-04-23 DIAGNOSIS — Z7985 Long-term (current) use of injectable non-insulin antidiabetic drugs: Secondary | ICD-10-CM

## 2021-04-23 DIAGNOSIS — Z4682 Encounter for fitting and adjustment of non-vascular catheter: Secondary | ICD-10-CM | POA: Diagnosis not present

## 2021-04-23 DIAGNOSIS — I5023 Acute on chronic systolic (congestive) heart failure: Secondary | ICD-10-CM | POA: Diagnosis present

## 2021-04-23 DIAGNOSIS — J189 Pneumonia, unspecified organism: Secondary | ICD-10-CM | POA: Diagnosis not present

## 2021-04-23 DIAGNOSIS — Z9911 Dependence on respirator [ventilator] status: Secondary | ICD-10-CM

## 2021-04-23 DIAGNOSIS — Z974 Presence of external hearing-aid: Secondary | ICD-10-CM | POA: Diagnosis not present

## 2021-04-23 DIAGNOSIS — I214 Non-ST elevation (NSTEMI) myocardial infarction: Secondary | ICD-10-CM | POA: Diagnosis not present

## 2021-04-23 DIAGNOSIS — R778 Other specified abnormalities of plasma proteins: Secondary | ICD-10-CM | POA: Diagnosis present

## 2021-04-23 DIAGNOSIS — I2510 Atherosclerotic heart disease of native coronary artery without angina pectoris: Secondary | ICD-10-CM | POA: Diagnosis present

## 2021-04-23 DIAGNOSIS — I509 Heart failure, unspecified: Secondary | ICD-10-CM

## 2021-04-23 DIAGNOSIS — E1122 Type 2 diabetes mellitus with diabetic chronic kidney disease: Secondary | ICD-10-CM

## 2021-04-23 DIAGNOSIS — I350 Nonrheumatic aortic (valve) stenosis: Secondary | ICD-10-CM | POA: Diagnosis present

## 2021-04-23 DIAGNOSIS — E1165 Type 2 diabetes mellitus with hyperglycemia: Secondary | ICD-10-CM

## 2021-04-23 DIAGNOSIS — R0789 Other chest pain: Secondary | ICD-10-CM | POA: Diagnosis not present

## 2021-04-23 DIAGNOSIS — Z8673 Personal history of transient ischemic attack (TIA), and cerebral infarction without residual deficits: Secondary | ICD-10-CM | POA: Diagnosis not present

## 2021-04-23 DIAGNOSIS — I5043 Acute on chronic combined systolic (congestive) and diastolic (congestive) heart failure: Secondary | ICD-10-CM | POA: Diagnosis present

## 2021-04-23 DIAGNOSIS — J440 Chronic obstructive pulmonary disease with acute lower respiratory infection: Secondary | ICD-10-CM | POA: Diagnosis present

## 2021-04-23 DIAGNOSIS — E119 Type 2 diabetes mellitus without complications: Secondary | ICD-10-CM

## 2021-04-23 DIAGNOSIS — Z452 Encounter for adjustment and management of vascular access device: Secondary | ICD-10-CM | POA: Diagnosis not present

## 2021-04-23 DIAGNOSIS — I428 Other cardiomyopathies: Secondary | ICD-10-CM | POA: Diagnosis not present

## 2021-04-23 DIAGNOSIS — Z978 Presence of other specified devices: Secondary | ICD-10-CM | POA: Diagnosis present

## 2021-04-23 DIAGNOSIS — N183 Chronic kidney disease, stage 3 unspecified: Secondary | ICD-10-CM

## 2021-04-23 DIAGNOSIS — Z947 Corneal transplant status: Secondary | ICD-10-CM | POA: Diagnosis not present

## 2021-04-23 DIAGNOSIS — G40909 Epilepsy, unspecified, not intractable, without status epilepticus: Secondary | ICD-10-CM | POA: Diagnosis present

## 2021-04-23 DIAGNOSIS — J9811 Atelectasis: Secondary | ICD-10-CM | POA: Diagnosis not present

## 2021-04-23 DIAGNOSIS — R9431 Abnormal electrocardiogram [ECG] [EKG]: Secondary | ICD-10-CM | POA: Diagnosis present

## 2021-04-23 DIAGNOSIS — I4892 Unspecified atrial flutter: Secondary | ICD-10-CM | POA: Diagnosis not present

## 2021-04-23 DIAGNOSIS — N179 Acute kidney failure, unspecified: Secondary | ICD-10-CM | POA: Diagnosis present

## 2021-04-23 DIAGNOSIS — R590 Localized enlarged lymph nodes: Secondary | ICD-10-CM | POA: Diagnosis present

## 2021-04-23 DIAGNOSIS — Z79899 Other long term (current) drug therapy: Secondary | ICD-10-CM

## 2021-04-23 DIAGNOSIS — Z87891 Personal history of nicotine dependence: Secondary | ICD-10-CM

## 2021-04-23 DIAGNOSIS — Z7984 Long term (current) use of oral hypoglycemic drugs: Secondary | ICD-10-CM

## 2021-04-23 DIAGNOSIS — M47816 Spondylosis without myelopathy or radiculopathy, lumbar region: Secondary | ICD-10-CM | POA: Diagnosis not present

## 2021-04-23 DIAGNOSIS — Z87898 Personal history of other specified conditions: Secondary | ICD-10-CM

## 2021-04-23 DIAGNOSIS — R079 Chest pain, unspecified: Secondary | ICD-10-CM | POA: Diagnosis not present

## 2021-04-23 DIAGNOSIS — I471 Supraventricular tachycardia: Secondary | ICD-10-CM | POA: Diagnosis not present

## 2021-04-23 DIAGNOSIS — J90 Pleural effusion, not elsewhere classified: Secondary | ICD-10-CM | POA: Diagnosis not present

## 2021-04-23 LAB — COMPREHENSIVE METABOLIC PANEL
ALT: 14 U/L (ref 0–44)
AST: 20 U/L (ref 15–41)
Albumin: 3.5 g/dL (ref 3.5–5.0)
Alkaline Phosphatase: 75 U/L (ref 38–126)
Anion gap: 12 (ref 5–15)
BUN: 18 mg/dL (ref 8–23)
CO2: 25 mmol/L (ref 22–32)
Calcium: 8.8 mg/dL — ABNORMAL LOW (ref 8.9–10.3)
Chloride: 99 mmol/L (ref 98–111)
Creatinine, Ser: 1.09 mg/dL (ref 0.61–1.24)
GFR, Estimated: >60 mL/min (ref >60)
Glucose, Bld: 280 mg/dL — ABNORMAL HIGH (ref 70–99)
Potassium: 4.2 mmol/L (ref 3.5–5.1)
Sodium: 136 mmol/L (ref 135–145)
Total Bilirubin: 0.7 mg/dL (ref 0.3–1.2)
Total Protein: 7.6 g/dL (ref 6.5–8.1)

## 2021-04-23 LAB — BLOOD GAS, ARTERIAL
Acid-Base Excess: 1.7 mmol/L (ref 0.0–2.0)
Bicarbonate: 25.7 mmol/L (ref 20.0–28.0)
FIO2: 1
MECHVT: 450 mL
O2 Saturation: 99.9 %
PEEP: 5 cmH2O
Patient temperature: 37
RATE: 16 resp/min
pCO2 arterial: 37 mmHg (ref 32.0–48.0)
pH, Arterial: 7.45 (ref 7.350–7.450)
pO2, Arterial: 259 mmHg — ABNORMAL HIGH (ref 83.0–108.0)

## 2021-04-23 LAB — APTT: aPTT: 20 seconds — ABNORMAL LOW (ref 24–36)

## 2021-04-23 LAB — CBC WITH DIFFERENTIAL/PLATELET
Abs Immature Granulocytes: 0.03 10*3/uL (ref 0.00–0.07)
Basophils Absolute: 0 10*3/uL (ref 0.0–0.1)
Basophils Relative: 0 %
Eosinophils Absolute: 0 10*3/uL (ref 0.0–0.5)
Eosinophils Relative: 1 %
HCT: 36.1 % — ABNORMAL LOW (ref 39.0–52.0)
Hemoglobin: 11.2 g/dL — ABNORMAL LOW (ref 13.0–17.0)
Immature Granulocytes: 0 %
Lymphocytes Relative: 10 %
Lymphs Abs: 0.9 10*3/uL (ref 0.7–4.0)
MCH: 26.7 pg (ref 26.0–34.0)
MCHC: 31 g/dL (ref 30.0–36.0)
MCV: 86 fL (ref 80.0–100.0)
Monocytes Absolute: 0.7 10*3/uL (ref 0.1–1.0)
Monocytes Relative: 8 %
Neutro Abs: 7.2 10*3/uL (ref 1.7–7.7)
Neutrophils Relative %: 81 %
Platelets: 207 10*3/uL (ref 150–400)
RBC: 4.2 MIL/uL — ABNORMAL LOW (ref 4.22–5.81)
RDW: 16 % — ABNORMAL HIGH (ref 11.5–15.5)
WBC: 8.9 10*3/uL (ref 4.0–10.5)
nRBC: 0 % (ref 0.0–0.2)

## 2021-04-23 LAB — GLUCOSE, CAPILLARY: Glucose-Capillary: 203 mg/dL — ABNORMAL HIGH (ref 70–99)

## 2021-04-23 LAB — MAGNESIUM
Magnesium: 1.4 mg/dL — ABNORMAL LOW (ref 1.7–2.4)
Magnesium: 1.3 mg/dL — ABNORMAL LOW (ref 1.7–2.4)

## 2021-04-23 LAB — HEPARIN LEVEL (UNFRACTIONATED): Heparin Unfractionated: 0.33 IU/mL (ref 0.30–0.70)

## 2021-04-23 LAB — RESP PANEL BY RT-PCR (FLU A&B, COVID) ARPGX2
Influenza A by PCR: NEGATIVE
Influenza B by PCR: NEGATIVE
SARS Coronavirus 2 by RT PCR: NEGATIVE

## 2021-04-23 LAB — TROPONIN I (HIGH SENSITIVITY)
Troponin I (High Sensitivity): 32 ng/L — ABNORMAL HIGH (ref <18)
Troponin I (High Sensitivity): 797 ng/L (ref <18)

## 2021-04-23 LAB — PROTIME-INR
INR: 1 (ref 0.8–1.2)
Prothrombin Time: 13.6 seconds (ref 11.4–15.2)

## 2021-04-23 LAB — TSH: TSH: 5.958 u[IU]/mL — ABNORMAL HIGH (ref 0.350–4.500)

## 2021-04-23 LAB — BRAIN NATRIURETIC PEPTIDE: B Natriuretic Peptide: 619.9 pg/mL — ABNORMAL HIGH (ref 0.0–100.0)

## 2021-04-23 MED ORDER — VALPROIC ACID 250 MG/5ML PO SOLN
250.0000 mg | Freq: Every day | ORAL | Status: DC
  Filled 2021-04-23: qty 5

## 2021-04-23 MED ORDER — METOPROLOL TARTRATE 5 MG/5ML IV SOLN
5.0000 mg | INTRAVENOUS | Status: DC | PRN
  Administered 2021-04-23: 5 mg via INTRAVENOUS
  Filled 2021-04-23: qty 5

## 2021-04-23 MED ORDER — METOPROLOL TARTRATE 25 MG PO TABS
25.0000 mg | ORAL_TABLET | Freq: Four times a day (QID) | ORAL | Status: DC
Start: 2021-04-24 — End: 2021-04-24
  Administered 2021-04-24 (×2): 25 mg
  Filled 2021-04-23 (×3): qty 1

## 2021-04-23 MED ORDER — PROPOFOL 1000 MG/100ML IV EMUL
INTRAVENOUS | Status: AC
  Filled 2021-04-23: qty 100

## 2021-04-23 MED ORDER — MIDAZOLAM HCL 2 MG/2ML IJ SOLN: 1.0000 mg | INTRAMUSCULAR | Status: DC | PRN

## 2021-04-23 MED ORDER — DIVALPROEX SODIUM 500 MG PO DR TAB: 500.0000 mg | DELAYED_RELEASE_TABLET | Freq: Every day | ORAL | Status: DC

## 2021-04-23 MED ORDER — ETOMIDATE 2 MG/ML IV SOLN
0.1000 mg/kg | Freq: Once | INTRAVENOUS | Status: AC
  Administered 2021-04-23: 7.76 mg via INTRAVENOUS
  Filled 2021-04-23: qty 10

## 2021-04-23 MED ORDER — ETOMIDATE 2 MG/ML IV SOLN
INTRAVENOUS | Status: AC
  Filled 2021-04-23: qty 10

## 2021-04-23 MED ORDER — PROPOFOL 1000 MG/100ML IV EMUL
5.0000 ug/kg/min | INTRAVENOUS | Status: DC
  Administered 2021-04-23: 20 ug/kg/min via INTRAVENOUS
  Administered 2021-04-24: 40 ug/kg/min via INTRAVENOUS
  Administered 2021-04-24: 35 ug/kg/min via INTRAVENOUS
  Filled 2021-04-23 (×2): qty 100

## 2021-04-23 MED ORDER — POLYETHYLENE GLYCOL 3350 17 G PO PACK
17.0000 g | PACK | Freq: Every day | ORAL | Status: DC
  Filled 2021-04-23 (×2): qty 1

## 2021-04-23 MED ORDER — MIDAZOLAM HCL 2 MG/2ML IJ SOLN
1.0000 mg | INTRAMUSCULAR | Status: DC | PRN
  Administered 2021-04-23 – 2021-04-24 (×2): 1 mg via INTRAVENOUS
  Filled 2021-04-23 (×2): qty 2

## 2021-04-23 MED ORDER — ROCURONIUM BROMIDE 10 MG/ML (PF) SYRINGE
PREFILLED_SYRINGE | INTRAVENOUS | Status: AC
  Filled 2021-04-23: qty 10

## 2021-04-23 MED ORDER — IOHEXOL 350 MG/ML SOLN
75.0000 mL | Freq: Once | INTRAVENOUS | Status: AC | PRN
  Administered 2021-04-23: 75 mL via INTRAVENOUS

## 2021-04-23 MED ORDER — FUROSEMIDE 10 MG/ML IJ SOLN
80.0000 mg | Freq: Once | INTRAMUSCULAR | Status: DC
  Filled 2021-04-23: qty 8

## 2021-04-23 MED ORDER — ROCURONIUM BROMIDE 50 MG/5ML IV SOLN
INTRAVENOUS | Status: DC | PRN
  Administered 2021-04-23: 80 mg via INTRAVENOUS

## 2021-04-23 MED ORDER — FAMOTIDINE IN NACL 20-0.9 MG/50ML-% IV SOLN
20.0000 mg | Freq: Two times a day (BID) | INTRAVENOUS | Status: DC
  Administered 2021-04-23: 20 mg via INTRAVENOUS
  Filled 2021-04-23: qty 50

## 2021-04-23 MED ORDER — ALBUTEROL SULFATE (2.5 MG/3ML) 0.083% IN NEBU: 2.5000 mg | INHALATION_SOLUTION | RESPIRATORY_TRACT | Status: DC | PRN

## 2021-04-23 MED ORDER — FENTANYL CITRATE PF 50 MCG/ML IJ SOSY
25.0000 ug | PREFILLED_SYRINGE | INTRAMUSCULAR | Status: DC | PRN
  Administered 2021-04-23: 25 ug via INTRAVENOUS
  Filled 2021-04-23: qty 1

## 2021-04-23 MED ORDER — MAGNESIUM SULFATE 2 GM/50ML IV SOLN
2.0000 g | Freq: Once | INTRAVENOUS | Status: AC
  Administered 2021-04-23: 2 g via INTRAVENOUS
  Filled 2021-04-23: qty 50

## 2021-04-23 MED ORDER — INSULIN ASPART 100 UNIT/ML IJ SOLN
0.0000 [IU] | INTRAMUSCULAR | Status: DC
  Administered 2021-04-23: 5 [IU] via SUBCUTANEOUS
  Administered 2021-04-24 (×2): 2 [IU] via SUBCUTANEOUS
  Administered 2021-04-24: 21:00:00 3 [IU] via SUBCUTANEOUS
  Administered 2021-04-24: 5 [IU] via SUBCUTANEOUS
  Administered 2021-04-24: 05:00:00 2 [IU] via SUBCUTANEOUS
  Administered 2021-04-24: 8 [IU] via SUBCUTANEOUS
  Administered 2021-04-25: 3 [IU] via SUBCUTANEOUS
  Administered 2021-04-25 (×2): 2 [IU] via SUBCUTANEOUS
  Administered 2021-04-25 (×2): 3 [IU] via SUBCUTANEOUS
  Administered 2021-04-26 (×2): 2 [IU] via SUBCUTANEOUS
  Administered 2021-04-26: 6 [IU] via SUBCUTANEOUS
  Administered 2021-04-26: 2 [IU] via SUBCUTANEOUS
  Administered 2021-04-26: 11 [IU] via SUBCUTANEOUS
  Administered 2021-04-27: 15 [IU] via SUBCUTANEOUS
  Administered 2021-04-27: 2 [IU] via SUBCUTANEOUS
  Administered 2021-04-27 (×2): 3 [IU] via SUBCUTANEOUS
  Filled 2021-04-23 (×21): qty 1

## 2021-04-23 MED ORDER — HEPARIN (PORCINE) 25000 UT/250ML-% IV SOLN
1200.0000 [IU]/h | INTRAVENOUS | Status: DC
Start: 2021-04-23 — End: 2021-04-24
  Administered 2021-04-23: 1200 [IU]/h via INTRAVENOUS
  Filled 2021-04-23: qty 250

## 2021-04-23 MED ORDER — FENTANYL CITRATE PF 50 MCG/ML IJ SOSY: 25.0000 ug | PREFILLED_SYRINGE | INTRAMUSCULAR | Status: DC | PRN

## 2021-04-23 MED ORDER — DOCUSATE SODIUM 100 MG PO CAPS: 100.0000 mg | ORAL_CAPSULE | Freq: Two times a day (BID) | ORAL | Status: DC | PRN

## 2021-04-23 MED ORDER — ATORVASTATIN CALCIUM 20 MG PO TABS
40.0000 mg | ORAL_TABLET | Freq: Every day | ORAL | Status: DC
  Administered 2021-04-24: 40 mg
  Filled 2021-04-23: qty 2

## 2021-04-23 MED ORDER — POLYETHYLENE GLYCOL 3350 17 G PO PACK: 17.0000 g | PACK | Freq: Every day | ORAL | Status: DC | PRN

## 2021-04-23 MED ORDER — DIVALPROEX SODIUM ER 250 MG PO TB24: 250.0000 mg | ORAL_TABLET | Freq: Every day | ORAL | Status: DC

## 2021-04-23 MED ORDER — HEPARIN BOLUS VIA INFUSION
5000.0000 [IU] | Freq: Once | INTRAVENOUS | Status: AC
Start: 2021-04-23 — End: 2021-04-23
  Administered 2021-04-23: 5000 [IU] via INTRAVENOUS
  Filled 2021-04-23: qty 5000

## 2021-04-23 MED ORDER — SODIUM CHLORIDE 0.9 % IV SOLN
2.0000 g | INTRAVENOUS | Status: DC
  Administered 2021-04-24: 2 g via INTRAVENOUS
  Filled 2021-04-23 (×2): qty 20

## 2021-04-23 MED ORDER — VALPROIC ACID 250 MG/5ML PO SOLN
500.0000 mg | Freq: Every day | ORAL | Status: DC
  Administered 2021-04-24: 500 mg
  Filled 2021-04-23 (×2): qty 10

## 2021-04-23 MED ORDER — ETOMIDATE 2 MG/ML IV SOLN
INTRAVENOUS | Status: DC | PRN
  Administered 2021-04-23: 16 mg via INTRAVENOUS

## 2021-04-23 MED ORDER — DOCUSATE SODIUM 50 MG/5ML PO LIQD
100.0000 mg | Freq: Two times a day (BID) | ORAL | Status: DC
  Administered 2021-04-24 – 2021-04-25 (×2): 100 mg
  Filled 2021-04-23 (×9): qty 10

## 2021-04-23 NOTE — Consult Note (Signed)
ANTICOAGULATION CONSULT NOTE - Initial Consult  Pharmacy Consult for heparin Indication: Hx of PE  No Known Allergies  Patient Measurements: Height: 5\' 3"  (160 cm) Weight: 77.6 kg (171 lb) IBW/kg (Calculated) : 56.9 Heparin Dosing Weight: 73.1 kg   Vital Signs: Temp: 98.2 F (36.8 C) (11/19 1854) Temp Source: Oral (11/19 1849) BP: 181/121 (11/19 2018) Pulse Rate: 145 (11/19 2009)  Labs: Recent Labs    04/23/21 1906  HGB 11.2*  HCT 36.1*  PLT 207  CREATININE 1.09  TROPONINIHS 32*    Estimated Creatinine Clearance: 62.3 mL/min (by C-G formula based on SCr of 1.09 mg/dL).   Medical History: Past Medical History:  Diagnosis Date   CAD (coronary artery disease) of artery bypass graft    s/p CABG x 4 in 1997   COPD (chronic obstructive pulmonary disease) ([facility])    Coronary artery disease    CVA (cerebral vascular accident) ([facility])    Diabetes mellitus without complication ([facility])    GERD (gastroesophageal reflux disease)    History of single vessel coronary artery bypass    HLD (hyperlipidemia)    Hypertension    Seizures ([facility])     Medications:  Xarelto 20 mg daily with supper, last dose documented as "within the past week"  Assessment: 65 y.o. male with a past medical hx of PE on Xarelto presented with complaints of left arm pain as well as chest pain. Pharmacy has been consulted for heparin dosing.   Baseline labs: Hgb 11.2, plts 207, aPTT,  INR, and heparin level ordered   Goal of Therapy:  Heparin level 0.3-0.7 units/ml Monitor platelets by anticoagulation protocol: Yes   Plan:  Give 5000 units bolus x 1 Start heparin infusion at 1200 units/hr Check anti-Xa level and aPTT in 6 hours, may monitor with anti-Xa if levels correlate  Continue to monitor H&H and platelets  76, PharmD 04/23/2021,9:58 PM

## 2021-04-23 NOTE — H&P (Addendum)
NAME:  Thomas Moore, MRN:  [id number], DOB:  Oct 27, 1955, LOS: 0 ADMISSION DATE:  04/23/2021, CONSULTATION DATE:  04/23/2021 REFERRING MD:  Dr. Charna Archer, CHIEF COMPLAINT:  Left arm & Chest pain   History of Present Illness:  65 year old male presenting to [facility] ED on 04/23/2021 from home with the assistance of his neighbor due to complaints of left arm pain as well as chest pain. Per neighbor, Gavin Potters, report the patient has complained of left arm pain before and was seen by his cardiologist.  Per chart review, he received an increase in his metoprolol on 03/03/21 as Holter monitor was demonstrating brief episodes of probable atrial tachycardia vs possible ventricular arrhythmia as well as nonsustained SVT with aberrancy. Most recent echocardiogram (02/2021) revealed LVEF 50-55%, moderate AS, mild MR & TR & mild LVH. ED course: Per ED documentation and EDP report patient arrived complaining of left arm and chest pain.  Initial EKG demonstrating a-flutter with diffuse ST depression & heart rate sustaining in the 130's.  EDP discussed case with [facility] cardiologist on-call Dr. Corky Sox.  Rate control was attempted with Lopressor IV, but patient continued to deteriorate clinically and cardioversion with 200 J x 2 was attempted without effect. The patient became hypoxic and the decision was made to emergently intubate, providing mechanical ventilatory support. medications given: 5 mg lopressor IV, etomidate 7.76 mg & 16 mg, rocuronium 80 mg, propofol drip initiated Initial Vitals: Afebrile at 98.2, tachypneic at 24, tachycardic at 136, hypertensive 170/105 with SPO2 95% on room air. Significant labs: (Labs/ Imaging personally reviewed) I, Domingo Pulse [place], AGACNP-BC, personally viewed and interpreted this ECG. EKG Interpretation: Date: 04/23/21, EKG Time: 18:42, Rate: 131, Rhythm: ST (query A-flutter?), QRS Axis:  normal, Intervals: prolonged Qtc at 522, ST/T Wave abnormalities: mild ST depression:  I, avF, II, V4-V6, Narrative Interpretation: ST with diffuse mild ST depression & prolonged Qtc F/u EKG post intubation improved with ST depression resolved, ST at 124 and normal axis, Qtc normalized Chemistry: Na+:136, K+: 4.2, BUN/Cr.: 18/ 1.09, Serum CO2/ AG: 25/ 12, TSH: 5.958 Hematology: WBC: 8.9, Hgb: 11.2,  Troponin: 32, BNP: 619.9, COVID-19 & Influenza A/B: negative ABG: 7.45/ 37/ 259/ 25.7 CXR 04/23/21: ETT in stable position, advancement required for OGT. Worsened interstitial pulmonary edema CT angio chest w or wo contrast: pending  PCCM consulted for admission due to patient's acute hypoxic respiratory failure requiring intubation and mechanical ventilatory support.  Pertinent  Medical History  CAD with congential coronary artery anomaly s/p CABG 1997 CVA T2DM Hypercholesteremia Syncope HTN Seizures Moderate Aortic Stenosis Hiatal Hernia with GERD Pulmonary Embolism on Xarelto Hearing loss with bilateral hearing aides HFrEF (LVEF 40% 2012) > recent Echo LVEF 50-55% Significant Hospital Events: Including procedures, antibiotic start and stop dates in addition to other pertinent events   04/23/21: admitted to ICU s/p failed cardioversion x 2 for suspected A-flutter and subsequent intubation & mechanical ventilatory support for acute hypoxic respiratory failure.  Interim History / Subjective:  Patient drowsy but responsive, unable to follow commands. VSS on mechanical ventilation with sedation  Objective   Blood pressure (!) 181/121, pulse (!) 145, temperature 98.2 F (36.8 C), resp. rate 17, height _0  (1.6 m), weight 77.6 kg, SpO2 99 %.    Vent Mode: AC FiO2 (%):  [100 %] 100 % Set Rate:  [16 bmp] 16 bmp Vt Set:  [450 mL] 450 mL PEEP:  [5 cmH20] 5 cmH20  No intake or output data in the 24 hours ending 04/23/21 2059 Filed  Weights   04/23/21 1849  Weight: 77.6 kg    Examination: General: Adult male, critically ill, lying in bed intubated & sedated requiring  mechanical ventilation, NAD HEENT: MM pink/moist, anicteric, atraumatic, neck supple Neuro: drowsy, responsive but sedated, unable to follow commands, PERRL +3, MAE CV: s1s2 RRR, ST on monitor, +murmur, no r/g Pulm: Regular, non labored on PRVC Fio2 50% & PEEP 5, breath sounds coarse throughout GI: soft, rounded, bs x 4 GU: foley in place with clear yellow urine Skin: no rashes/lesions noted Extremities: warm/dry, pulses + 2 R/P, trace edema noted BLE  Resolved Hospital Problem list     Assessment & Plan:  Acute Hypoxic Respiratory Failure secondary to suspected flash pulmonary edema in the setting of cardioversion x 2 with sedatives Addendum: Acute Hypoxic Respiratory Failure s/t suspected Pneumonia- Query Aspiration? PMHx: COPD (unclear status- no PFT's in Epic) CTa: negative for PE but demonstrates multifocal perihilar & Lower lobe interstitial /ground glass infiltrates as well as small bilateral pleural effusions - Ventilator settings: PRVC  8 mL/kg, 50% FiO2, 5 PEEP, continue ventilator support & lung protective strategies - Wean PEEP & FiO2 as tolerated, maintain SpO2 > 90% - Head of bed elevated 30 degrees, VAP protocol in place - Plateau pressures less than 30 cm H20  - Intermittent chest x-ray & ABG PRN - Daily WUA with SBT as tolerated  - Ensure adequate pulmonary hygiene  - Bronchodilators PRN - PAD protocol in place: continue Fentanyl IVP & Propofol drip - Ceftriaxone Pna coverage initiated, f/u tracheal aspirate, trend PCT/WBC/fever curve  Acute on Chronic HFrEF exacerbation Elevated Troponin secondary to suspected demand ischemia vs N-STEMI Moderate AS QTc prolongation- resolved Query A-flutter on admission- resolved s/p cardioversion x 2 PMHx: CAD (with congential coronary artery anomaly s/p CABG 1997), HTN, HLD, Pulmonary Embolism Recent Holter monitor showed: brief episodes of probable atrial tachycardia vs possible ventricular arrhythmia (nonsustained SVT with  aberrancy)  On admission EKG: ST with diffuse mild ST depression & prolonged Qtc (query of A-flutter bedside by EDP, s/p cardioversion x 2 with 200 J) F/u EKG post intubation improved with ST depression resolved, ST at 124 and normal axis, Qtc normalized. CXR post intubation revealed worsening interstitial edema, CTa pending, Troponins: 32 BNP: 619.9. ECHO 9/22> showed improved LVEF 50-55%, moderate AS, mild MR & TR  - hold off on diuresis this shift as patient has improved significantly with intubation, recently received IV contrast & has Moderate AS per most recent ECHO - Echocardiogram ordered - Mg supplementation - Heparin drip initiated instead of Xarelto - continue home Metoprolol 25 mg Q 6 h PO (instead of 150 mg XL, while on propofol) as BP allows - trend troponin - Continuous cardiac monitoring  - Strict I/O's: alert provider if UOP < 0.5 mL/kg/hr - Daily BMP, replace electrolytes PRN - Daily weights to assess volume status - Supplemental oxygen as needed, maintain SpO2 > 90% - continue outpatient Lipitor - hold rest of outpatient regimen, consider restarting as patient stabilizes: lisinopril, Imdur, ramipril - Cardiology consulted, appreciate input  Elevated TSH TSH: 5.958, elevated from 4/21 value: 3.637 No evidence of thyroid disorder in history - f/u T4 level  Seizure disorder without acute episode - Neuro checks Q 4 h - continue outpatient AED regimen adjusted for OGT administration: valproic acid 250 mg daily & 500 mg QHS - seizure/aspiration/falls precautions  Poorly controlled Type 2 Diabetes Mellitus Hemoglobin A1C: pending - Monitor CBG Q 4 hours - SSI moderate dosing - target range while in  ICU: 140-180 - follow ICU hyper/hypo-glycemia protocol  Best Practice (right click and "Reselect all SmartList Selections" daily)  Diet/type: NPO w/ meds via tube DVT prophylaxis: systemic heparin GI prophylaxis: H2B Lines: N/A Foley:  Yes, and it is still needed Code  Status:  full code Last date of multidisciplinary goals of care discussion [04/23/21] *Updated the patient's brother, Samba Cumba, who confirmed FULL CODE STATUS & also confirms he is the patient's NOK. He would like Gavin Potters, neighbor to help keep him updated on his brother's status as he is disabled and does not drive. All questions and concerns answered at this time.  Labs   CBC: Recent Labs  Lab 04/23/21 1906  WBC 8.9  NEUTROABS 7.2  HGB 11.2*  HCT 36.1*  MCV 86.0  PLT 096    Basic Metabolic Panel: No results for input(s): NA, K, CL, CO2, GLUCOSE, BUN, CREATININE, CALCIUM, MG, PHOS in the last 168 hours. GFR: CrCl cannot be calculated (Patient's most recent lab result is older than the maximum 21 days allowed.). Recent Labs  Lab 04/23/21 1906  WBC 8.9    Liver Function Tests: No results for input(s): AST, ALT, ALKPHOS, BILITOT, PROT, ALBUMIN in the last 168 hours. No results for input(s): LIPASE, AMYLASE in the last 168 hours. No results for input(s): AMMONIA in the last 168 hours.  ABG    Component Value Date/Time   PHART 7.45 04/23/2021 2038   PCO2ART 37 04/23/2021 2038   PO2ART 259 (H) 04/23/2021 2038   HCO3 25.7 04/23/2021 2038   TCO2 34 (H) 09/29/2019 1034   O2SAT 99.9 04/23/2021 2038     Coagulation Profile: No results for input(s): INR, PROTIME in the last 168 hours.  Cardiac Enzymes: No results for input(s): CKTOTAL, CKMB, CKMBINDEX, TROPONINI in the last 168 hours.  HbA1C: Hemoglobin A1C  Date/Time Value Ref Range Status  02/24/2020 08:22 AM 7.2 (A) 4.0 - 5.6 % Final   Hgb A1c MFr Bld  Date/Time Value Ref Range Status  05/12/2020 09:40 AM 7.4 (H) 4.8 - 5.6 % Final    Comment:             Prediabetes: 5.7 - 6.4          Diabetes: >6.4          Glycemic control for adults with diabetes: <7.0   09/29/2019 04:04 PM 6.6 (H) 4.8 - 5.6 % Final    Comment:    (NOTE) Pre diabetes:          5.7%-6.4% Diabetes:              >6.4% Glycemic  control for   <7.0% adults with diabetes     CBG: No results for input(s): GLUCAP in the last 168 hours.  Review of Systems:   UTA- intubated and sedated  Past Medical History:  He,  has a past medical history of CAD (coronary artery disease) of artery bypass graft, COPD (chronic obstructive pulmonary disease) ([place]), Coronary artery disease, CVA (cerebral vascular accident) ([place]), Diabetes mellitus without complication ([place]), GERD (gastroesophageal reflux disease), History of single vessel coronary artery bypass, HLD (hyperlipidemia), Hypertension, and Seizures ([place]).   Surgical History:   Past Surgical History:  Procedure Laterality Date   CARDIAC SURGERY     CORNEAL TRANSPLANT Right    CORONARY ARTERY BYPASS GRAFT  1997   EYE SURGERY     FLEXOR TENDON REPAIR Left 07/11/2019   Procedure: FLEXOR tenolysis  REPAIR LEFT RING FINGER with tednon repair;  Surgeon:  Hessie Knows, MD;  Location: [facility] ORS;  Service: Orthopedics;  Laterality: Left;   INCISION AND DRAINAGE ABSCESS Left 05/08/2019   Procedure: INCISION AND DRAINAGE ABSCESS;  Surgeon: Dereck Leep, MD;  Location: [facility] ORS;  Service: Orthopedics;  Laterality: Left;   LEFT HEART CATH AND CORS/GRAFTS ANGIOGRAPHY N/A 10/02/2019   Procedure: LEFT HEART CATH AND CORS/GRAFTS ANGIOGRAPHY;  Surgeon: Leonie Man, MD;  Location: [place] [facility];  Service: Cardiovascular;  Laterality: N/A;   LOOP RECORDER INSERTION N/A 10/06/2019   Procedure: LOOP RECORDER INSERTION;  Surgeon: Evans Lance, MD;  Location: [place] [facility];  Service: Cardiovascular;  Laterality: N/A;     Social History:   reports that he has quit smoking. His smoking use included cigarettes. He has never used smokeless tobacco. He reports that he does not currently use alcohol. He reports that he does not use drugs.   Family History:  His family history includes Seizures in his brother.   Allergies No Known Allergies   Home Medications  Prior to Admission  medications   Medication Sig Start Date End Date Taking? Authorizing Provider  atorvastatin (LIPITOR) 40 MG tablet TAKE ONE TABLET BY MOUTH AT BEDTIME 03/25/20 03/25/21  Iloabachie, Chioma E, NP  blood glucose meter kit and supplies KIT Dispense based on patient and insurance preference. Use up to four times daily as directed. (FOR ICD-9 250.00, 250.01). 02/19/20   Iloabachie, Chioma E, NP  calcium citrate-vitamin D (CITRACAL+D) 315-200 MG-UNIT tablet Take 1 tablet by mouth 2 (two) times daily.    [provider]  Cysteamine Bitartrate (PROCYSBI) 300 MG PACK as directed And strips and lancets for qd check for dm ii 06/14/20 06/14/21  [provider]  divalproex (DEPAKOTE) 250 MG DR tablet  12/19/20   [provider]  glipiZIDE (GLUCOTROL XL) 10 MG 24 hr tablet TAKE ONE TABLET BY MOUTH 2 TIMES A DAY 03/25/20 03/25/21  Iloabachie, Chioma E, NP  ipratropium (ATROVENT) 0.06 % nasal spray  12/13/20   [provider]  isosorbide mononitrate (IMDUR) 30 MG 24 hr tablet  12/28/20   [provider]  lisinopril (ZESTRIL) 40 MG tablet TAKE ONE TABLET BY MOUTH EVERY DAY 05/18/20 05/18/21  Iloabachie, Chioma E, NP  metFORMIN (GLUCOPHAGE-XR) 500 MG 24 hr tablet TAKE TWO TABLETS BY MOUTH 2 TIMES A DAY 03/25/20 03/25/21  Iloabachie, Chioma E, NP  metoprolol succinate (TOPROL-XL) 100 MG 24 hr tablet Take 1 tablet (100 mg total) by mouth daily. 03/25/20   Iloabachie, Chioma E, NP  Multiple Vitamin (MULTIVITAMIN) capsule Take 1 capsule by mouth daily.    [provider]  nitroGLYCERIN (NITROSTAT) 0.4 MG SL tablet Place under the tongue. 01/27/21 10/05/22  [provider]  omeprazole (PRILOSEC) 40 MG capsule TAKE ONE CAPSULE BY MOUTH EVERY DAY 03/25/20 03/25/21  Iloabachie, Chioma E, NP  ramipril (ALTACE) 10 MG capsule Take by mouth. 06/14/20   [provider]  TRULICITY 2.54 YH/0.6CB SOPN Inject 0.75 mg into the skin once a week. Wednesday 02/19/20    Langston Reusing, NP  XARELTO 20 MG TABS tablet  12/28/20   [provider]     Critical care time: 62 minutes       Venetia Night, AGACNP-BC Acute Care Nurse Practitioner [facility]   [phone] / [phone] Please see Amion for pager details.
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
## 2021-04-23 NOTE — ED Notes (Signed)
Lab called regarding dely in labs

## 2021-04-23 NOTE — ED Notes (Signed)
Pts oxygen sats 80 on 4lnc, pt appears in resp distress ed md aware

## 2021-04-23 NOTE — ED Triage Notes (Signed)
Patient c/o left arm and chest pain. Patient with extensive cardiac hx.

## 2021-04-23 NOTE — Sedation Documentation (Signed)
No success with cardioversion, pt de sated , preparing to nintubate b

## 2021-04-23 NOTE — ED Notes (Signed)
Intubated 23 at lip

## 2021-04-24 ENCOUNTER — Encounter: Payer: Self-pay | Admitting: Internal Medicine

## 2021-04-24 ENCOUNTER — Inpatient Hospital Stay: Payer: Medicare HMO

## 2021-04-24 ENCOUNTER — Inpatient Hospital Stay (HOSPITAL_COMMUNITY)
Admit: 2021-04-24 | Discharge: 2021-04-24 | Disposition: A | Discharge status: Home / Self Care | Payer: Medicare HMO | Attending: Pulmonary Disease | Admitting: Pulmonary Disease

## 2021-04-24 DIAGNOSIS — I428 Other cardiomyopathies: Secondary | ICD-10-CM | POA: Diagnosis not present

## 2021-04-24 LAB — BASIC METABOLIC PANEL
Anion gap: 9 (ref 5–15)
BUN: 15 mg/dL (ref 8–23)
CO2: 27 mmol/L (ref 22–32)
Calcium: 8.5 mg/dL — ABNORMAL LOW (ref 8.9–10.3)
Chloride: 104 mmol/L (ref 98–111)
Creatinine, Ser: 1.06 mg/dL (ref 0.61–1.24)
GFR, Estimated: >60 mL/min (ref >60)
Glucose, Bld: 130 mg/dL — ABNORMAL HIGH (ref 70–99)
Potassium: 3.9 mmol/L (ref 3.5–5.1)
Sodium: 140 mmol/L (ref 135–145)

## 2021-04-24 LAB — TROPONIN I (HIGH SENSITIVITY)
Troponin I (High Sensitivity): 10491 ng/L
Troponin I (High Sensitivity): 11994 ng/L (ref <18)
Troponin I (High Sensitivity): 2925 ng/L (ref <18)

## 2021-04-24 LAB — GLUCOSE, CAPILLARY
Glucose-Capillary: 142 mg/dL — ABNORMAL HIGH (ref 70–99)
Glucose-Capillary: 129 mg/dL — ABNORMAL HIGH (ref 70–99)
Glucose-Capillary: 140 mg/dL — ABNORMAL HIGH (ref 70–99)
Glucose-Capillary: 256 mg/dL — ABNORMAL HIGH (ref 70–99)
Glucose-Capillary: 177 mg/dL — ABNORMAL HIGH (ref 70–99)
Glucose-Capillary: 220 mg/dL — ABNORMAL HIGH (ref 70–99)

## 2021-04-24 LAB — PROCALCITONIN
Procalcitonin: <0.1 ng/mL
Procalcitonin: <0.1 ng/mL

## 2021-04-24 LAB — ECHOCARDIOGRAM COMPLETE
AR max vel: 0.76 cm2
AV Area VTI: 0.77 cm2
AV Area mean vel: 0.7 cm2
AV Mean grad: 19.8 mmHg
AV Peak grad: 36.7 mmHg
Ao pk vel: 3.03 m/s
Area-P 1/2: 3.46 cm2
Height: 63 in
S' Lateral: 4.5 cm
Single Plane A4C EF: 50.6 %
Weight: 2733.7 oz

## 2021-04-24 LAB — CBC
HCT: 32.2 % — ABNORMAL LOW (ref 39.0–52.0)
Hemoglobin: 9.9 g/dL — ABNORMAL LOW (ref 13.0–17.0)
MCH: 26 pg (ref 26.0–34.0)
MCHC: 30.7 g/dL (ref 30.0–36.0)
MCV: 84.5 fL (ref 80.0–100.0)
Platelets: 188 10*3/uL (ref 150–400)
RBC: 3.81 MIL/uL — ABNORMAL LOW (ref 4.22–5.81)
RDW: 16.1 % — ABNORMAL HIGH (ref 11.5–15.5)
WBC: 5.5 10*3/uL (ref 4.0–10.5)
nRBC: 0 % (ref 0.0–0.2)

## 2021-04-24 LAB — HEPARIN LEVEL (UNFRACTIONATED): Heparin Unfractionated: 0.78 IU/mL — ABNORMAL HIGH (ref 0.30–0.70)

## 2021-04-24 LAB — PHOSPHORUS: Phosphorus: 3.7 mg/dL (ref 2.5–4.6)

## 2021-04-24 LAB — HIV ANTIBODY (ROUTINE TESTING W REFLEX): HIV Screen 4th Generation wRfx: NONREACTIVE

## 2021-04-24 LAB — HEMOGLOBIN A1C
Hgb A1c MFr Bld: 6.2 % — ABNORMAL HIGH (ref 4.8–5.6)
Mean Plasma Glucose: 131.24 mg/dL

## 2021-04-24 LAB — TRIGLYCERIDES: Triglycerides: 181 mg/dL — ABNORMAL HIGH (ref <150)

## 2021-04-24 LAB — APTT: aPTT: 66 seconds — ABNORMAL HIGH (ref 24–36)

## 2021-04-24 LAB — MAGNESIUM: Magnesium: 2.4 mg/dL (ref 1.7–2.4)

## 2021-04-24 LAB — T4, FREE: Free T4: 0.93 ng/dL (ref 0.61–1.12)

## 2021-04-24 LAB — VALPROIC ACID LEVEL: Valproic Acid Lvl: 30 ug/mL — ABNORMAL LOW (ref 50.0–100.0)

## 2021-04-24 MED ORDER — CLOPIDOGREL BISULFATE 75 MG PO TABS
600.0000 mg | ORAL_TABLET | Freq: Once | ORAL | Status: AC
  Administered 2021-04-24: 600 mg
  Filled 2021-04-24: qty 8

## 2021-04-24 MED ORDER — DIVALPROEX SODIUM 500 MG PO DR TAB
500.0000 mg | DELAYED_RELEASE_TABLET | Freq: Every day | ORAL | Status: DC
  Administered 2021-04-24 – 2021-04-26 (×3): 500 mg via ORAL
  Filled 2021-04-24 (×4): qty 1

## 2021-04-24 MED ORDER — ENOXAPARIN SODIUM 80 MG/0.8ML IJ SOSY
1.0000 mg/kg | PREFILLED_SYRINGE | Freq: Two times a day (BID) | INTRAMUSCULAR | Status: DC
  Administered 2021-04-24: 77.5 mg via SUBCUTANEOUS
  Filled 2021-04-24: qty 0.8

## 2021-04-24 MED ORDER — VALPROIC ACID 250 MG/5ML PO SOLN: 250.0000 mg | Freq: Every day | ORAL | Status: DC

## 2021-04-24 MED ORDER — FAMOTIDINE 40 MG/5ML PO SUSR
20.0000 mg | Freq: Every day | ORAL | Status: DC
  Filled 2021-04-24: qty 2.5

## 2021-04-24 MED ORDER — AZITHROMYCIN 500 MG PO TABS
500.0000 mg | ORAL_TABLET | Freq: Every day | ORAL | Status: DC
  Filled 2021-04-24: qty 1

## 2021-04-24 MED ORDER — ASPIRIN 81 MG PO CHEW
324.0000 mg | CHEWABLE_TABLET | Freq: Once | ORAL | Status: AC
  Administered 2021-04-24: 324 mg
  Filled 2021-04-24: qty 4

## 2021-04-24 MED ORDER — SODIUM CHLORIDE 0.9 % IV SOLN: INTRAVENOUS | Status: DC

## 2021-04-24 MED ORDER — CHLORHEXIDINE GLUCONATE 0.12% ORAL RINSE (MEDLINE KIT)
15.0000 mL | Freq: Two times a day (BID) | OROMUCOSAL | Status: DC
  Administered 2021-04-24 – 2021-04-26 (×4): 15 mL via OROMUCOSAL

## 2021-04-24 MED ORDER — ASPIRIN 81 MG PO CHEW: 81.0000 mg | CHEWABLE_TABLET | Freq: Every day | ORAL | Status: DC

## 2021-04-24 MED ORDER — FUROSEMIDE 10 MG/ML IJ SOLN
80.0000 mg | Freq: Once | INTRAMUSCULAR | Status: AC
  Administered 2021-04-24: 80 mg via INTRAVENOUS
  Filled 2021-04-24: qty 8

## 2021-04-24 MED ORDER — CLOPIDOGREL BISULFATE 75 MG PO TABS: 75.0000 mg | ORAL_TABLET | Freq: Every day | ORAL | Status: DC

## 2021-04-24 MED ORDER — DIVALPROEX SODIUM ER 250 MG PO TB24
250.0000 mg | ORAL_TABLET | Freq: Every day | ORAL | Status: DC
  Administered 2021-04-25 – 2021-04-27 (×3): 250 mg via ORAL
  Filled 2021-04-24 (×3): qty 1

## 2021-04-24 MED ORDER — ATORVASTATIN CALCIUM 20 MG PO TABS
40.0000 mg | ORAL_TABLET | Freq: Every day | ORAL | Status: DC
  Administered 2021-04-24 – 2021-04-26 (×3): 40 mg via ORAL
  Filled 2021-04-24 (×3): qty 2

## 2021-04-24 MED ORDER — ASPIRIN 81 MG PO CHEW
81.0000 mg | CHEWABLE_TABLET | Freq: Every day | ORAL | Status: DC
Start: 2021-04-25 — End: 2021-04-24

## 2021-04-24 MED ORDER — VALPROIC ACID 250 MG/5ML PO SOLN
500.0000 mg | Freq: Every day | ORAL | Status: DC
  Filled 2021-04-24: qty 10

## 2021-04-24 MED ORDER — INSULIN GLARGINE-YFGN 100 UNIT/ML ~~LOC~~ SOLN
5.0000 [IU] | Freq: Every day | SUBCUTANEOUS | Status: DC
  Administered 2021-04-24 – 2021-04-26 (×2): 5 [IU] via SUBCUTANEOUS
  Filled 2021-04-24 (×5): qty 0.05

## 2021-04-24 MED ORDER — CHLORHEXIDINE GLUCONATE CLOTH 2 % EX PADS: 6.0000 | MEDICATED_PAD | Freq: Every day | CUTANEOUS | Status: DC

## 2021-04-24 MED ORDER — HEPARIN (PORCINE) 25000 UT/250ML-% IV SOLN
1000.0000 [IU]/h | INTRAVENOUS | Status: DC
  Administered 2021-04-24: 900 [IU]/h via INTRAVENOUS
  Filled 2021-04-24: qty 250

## 2021-04-24 MED ORDER — ASPIRIN 81 MG PO CHEW
81.0000 mg | CHEWABLE_TABLET | Freq: Every day | ORAL | Status: DC
  Administered 2021-04-25 – 2021-04-27 (×3): 81 mg via ORAL
  Filled 2021-04-24 (×3): qty 1

## 2021-04-24 MED ORDER — SODIUM CHLORIDE 0.9 % IV SOLN: 250.0000 mL | INTRAVENOUS | Status: DC | PRN

## 2021-04-24 MED ORDER — HEPARIN BOLUS VIA INFUSION
3000.0000 [IU] | Freq: Once | INTRAVENOUS | Status: AC
Start: 2021-04-24 — End: 2021-04-24
  Administered 2021-04-24: 3000 [IU] via INTRAVENOUS
  Filled 2021-04-24: qty 3000

## 2021-04-24 MED ORDER — SODIUM CHLORIDE 0.9% FLUSH: 3.0000 mL | INTRAVENOUS | Status: DC | PRN

## 2021-04-24 MED ORDER — METOPROLOL TARTRATE 25 MG PO TABS
25.0000 mg | ORAL_TABLET | Freq: Four times a day (QID) | ORAL | Status: DC
  Administered 2021-04-24 – 2021-04-26 (×8): 25 mg via ORAL
  Filled 2021-04-24 (×7): qty 1

## 2021-04-24 MED ORDER — SODIUM CHLORIDE 0.9% FLUSH
3.0000 mL | Freq: Two times a day (BID) | INTRAVENOUS | Status: DC
  Administered 2021-04-24 (×2): 3 mL via INTRAVENOUS

## 2021-04-24 MED ORDER — CLOPIDOGREL BISULFATE 75 MG PO TABS
75.0000 mg | ORAL_TABLET | Freq: Every day | ORAL | Status: DC
  Administered 2021-04-25 – 2021-04-27 (×3): 75 mg via ORAL
  Filled 2021-04-24 (×3): qty 1

## 2021-04-24 MED ORDER — POTASSIUM CHLORIDE 20 MEQ PO PACK
40.0000 meq | PACK | Freq: Once | ORAL | Status: AC
  Administered 2021-04-24: 40 meq
  Filled 2021-04-24: qty 2

## 2021-04-24 MED ORDER — ORAL CARE MOUTH RINSE
15.0000 mL | OROMUCOSAL | Status: DC
  Administered 2021-04-24 – 2021-04-25 (×11): 15 mL via OROMUCOSAL

## 2021-04-24 NOTE — Progress Notes (Signed)
*  PRELIMINARY RESULTS* Echocardiogram 2D Echocardiogram has been performed.  Lenor Coffin 04/24/2021, 12:22 PM

## 2021-04-24 NOTE — Consult Note (Signed)
[facility] Cardiology  CARDIOLOGY CONSULT NOTE  Patient ID: DANNE VASEK MRN: [id number] DOB/AGE: [birth year] 65 y.o.  Admit date: 04/23/2021 Referring Physician Otelia Limes Primary Physician Patient, No Pcp Per (Inactive) Primary Cardiologist Barnabas Harries, MD Reason for Consultation SVT, NSTEMI, respiratory failure  HPI:  Orlander Norwood is a 65 yo M with h/o CAD s/p CABG (x4 1997 SVG to rPDA occluded, SVG to OM1/2 sequential, LIMA to LAD), moderate AS, T2DM, HTN, seizure DO, PE on Xarelto who presented with interment left arm pain that worsened yesterday. He was discovered to be in a narrow complex tachycarrhythmia (thought to be atrial flutter). He subsequently decompensated requiring intubation.   He presented to the ED yesterday evening with 2 weeks of left arm pain with exertion that seemed to worsen yesterday while he was shopping. Says he took 3 ntg over a 3 hour period without any improvement. He denies any shortness of breath or chest pain, but given his arm pain he presented to the ED. In the ED his initial EKG showed a narrow complex rhythm with diffuse ST depression at a rate of 130; he was given metoprolol which reportedly worsened his respiratory status. He says he developed chest pain at some point after coming to the ED. His repeat EKG a 8:30pm shows sinus tachycardia with improved ST depressions. He was then attempted to be cardioverted which was unsuccessful and resulted in respiratory failure requiring intubation.   This morning he was extubated to BiPAP. He is awake and alert. He says that he no longer is having chest or arm pain and feels much better. He does not feel short of breath, but is still wearing the BiPAP machine. Labs notable for Hgb 9.9 (from 11.1), and troponin increased to 12K this morning. EKG shows NSR with mild diffuse ST depressions.   Review of systems complete and found to be negative unless listed above     Past Medical History:  Diagnosis Date   CAD (coronary  artery disease) of artery bypass graft    s/p CABG x 4 in 1997   COPD (chronic obstructive pulmonary disease) (HCC)    Coronary artery disease    CVA (cerebral vascular accident) ([place])    Diabetes mellitus without complication ([place])    GERD (gastroesophageal reflux disease)    History of single vessel coronary artery bypass    HLD (hyperlipidemia)    Hypertension    Seizures ([place])     Past Surgical History:  Procedure Laterality Date   CARDIAC SURGERY     CORNEAL TRANSPLANT Right    CORONARY ARTERY BYPASS GRAFT  1997   EYE SURGERY     FLEXOR TENDON REPAIR Left 07/11/2019   Procedure: FLEXOR tenolysis  REPAIR LEFT RING FINGER with tednon repair;  Surgeon: Hessie Knows, MD;  Location: [facility] ORS;  Service: Orthopedics;  Laterality: Left;   INCISION AND DRAINAGE ABSCESS Left 05/08/2019   Procedure: INCISION AND DRAINAGE ABSCESS;  Surgeon: Dereck Leep, MD;  Location: [facility] ORS;  Service: Orthopedics;  Laterality: Left;   LEFT HEART CATH AND CORS/GRAFTS ANGIOGRAPHY N/A 10/02/2019   Procedure: LEFT HEART CATH AND CORS/GRAFTS ANGIOGRAPHY;  Surgeon: Leonie Man, MD;  Location: [place] [facility];  Service: Cardiovascular;  Laterality: N/A;   LOOP RECORDER INSERTION N/A 10/06/2019   Procedure: LOOP RECORDER INSERTION;  Surgeon: Evans Lance, MD;  Location: [place] [facility];  Service: Cardiovascular;  Laterality: N/A;    Medications Prior to Admission  Medication Sig Dispense Refill Last Dose  atorvastatin (LIPITOR) 40 MG tablet TAKE ONE TABLET BY MOUTH AT BEDTIME 30 tablet 3 Past Week   blood glucose meter kit and supplies KIT Dispense based on patient and insurance preference. Use up to four times daily as directed. (FOR ICD-9 250.00, 250.01). 1 each 0 Past Week   calcium citrate-vitamin D (CITRACAL+D) 315-200 MG-UNIT tablet Take 1 tablet by mouth 2 (two) times daily.   Past Week   Cysteamine Bitartrate (PROCYSBI) 300 MG PACK as directed And strips and lancets for qd check for dm ii    Past Week   divalproex (DEPAKOTE ER) 250 MG 24 hr tablet Take 250 mg by mouth daily.   Past Week   divalproex (DEPAKOTE) 500 MG DR tablet Take 500 mg by mouth daily as needed.   prn at prn   glipiZIDE (GLUCOTROL XL) 10 MG 24 hr tablet TAKE ONE TABLET BY MOUTH 2 TIMES A DAY 60 tablet 3 Past Week   ipratropium (ATROVENT) 0.06 % nasal spray Place 2 sprays into both nostrils 3 (three) times daily.   Past Week   isosorbide mononitrate (IMDUR) 30 MG 24 hr tablet Take 30 mg by mouth daily.   Past Week   lisinopril (ZESTRIL) 40 MG tablet TAKE ONE TABLET BY MOUTH EVERY DAY 30 tablet 1 Past Week   metFORMIN (GLUCOPHAGE-XR) 500 MG 24 hr tablet TAKE TWO TABLETS BY MOUTH 2 TIMES A DAY 120 tablet 3 Past Week   metoprolol succinate (TOPROL-XL) 100 MG 24 hr tablet Take 1 tablet (100 mg total) by mouth daily. 30 tablet 3 Past Week   Multiple Vitamin (MULTIVITAMIN) capsule Take 1 capsule by mouth daily.   Past Week   nitroGLYCERIN (NITROSTAT) 0.4 MG SL tablet Place under the tongue.   prn at prn   omeprazole (PRILOSEC) 40 MG capsule TAKE ONE CAPSULE BY MOUTH EVERY DAY 30 capsule 3 Past Week   ramipril (ALTACE) 10 MG capsule Take 10 mg by mouth daily.   Past Week   TRULICITY 1.32 GM/0.1UU SOPN Inject 0.75 mg into the skin once a week. Wednesday 1 mL 4 Past Week   XARELTO 20 MG TABS tablet Take 20 mg by mouth daily with supper.   Past Week   Social History   Socioeconomic History   Marital status: Single    Spouse name: Not on file   Number of children: 0   Years of education: Not on file   Highest education level: High school graduate  Occupational History   Occupation: Unemployed    Comment: Trying to get disability  Tobacco Use   Smoking status: Former    Types: Cigarettes   Smokeless tobacco: Never   Tobacco comments:    Quit 40 years ago  Vaping Use   Vaping Use: Never used  Substance and Sexual Activity   Alcohol use: Not Currently   Drug use: Never   Sexual activity: Never  Other Topics  Concern   Not on file  Social History Narrative   ** Merged History Encounter **    Lives in an apartment on [address], has to climb 17 floors   Social Determinants of Health   Financial Resource Strain: Not on file  Food Insecurity: Not on file  Transportation Needs: Not on file  Physical Activity: Not on file  Stress: Not on file  Social Connections: Not on file  Intimate Partner Violence: Not on file    Family History  Problem Relation Age of Onset   Seizures Brother  Review of systems complete and found to be negative unless listed above      PHYSICAL EXAM  General: Well developed, well nourished, in no acute distress. BiPAP in place. HEENT:  Normocephalic and atramatic Neck:  No JVD.  Lungs: Clear bilaterally to auscultation and percussion. Wearing BiPAP. Heart: HRRR . 3/6 systolic murmur loudest at RUSB, diminished s2.  Abdomen: Bowel sounds are positive, abdomen soft and non-tender  Msk:  Back normal, normal gait. Normal strength and tone for age. Extremities: No clubbing, cyanosis or edema.   Neuro: Alert and oriented X 3. Psych:  Good affect, responds appropriately  Labs:   Lab Results  Component Value Date   WBC 8.9 04/23/2021   HGB 11.2 (L) 04/23/2021   HCT 36.1 (L) 04/23/2021   MCV 86.0 04/23/2021   PLT 207 04/23/2021    Recent Labs  Lab 04/23/21 1906  NA 136  K 4.2  CL 99  CO2 25  BUN 18  CREATININE 1.09  CALCIUM 8.8*  PROT 7.6  BILITOT 0.7  ALKPHOS 75  ALT 14  AST 20  GLUCOSE 280*   Lab Results  Component Value Date   TROPONINI <0.03 08/08/2017    Lab Results  Component Value Date   CHOL 143 02/25/2020   CHOL 131 09/30/2019   CHOL 155 08/09/2017   Lab Results  Component Value Date   HDL 31 (L) 02/25/2020   HDL 28 (L) 09/30/2019   HDL 35 (L) 08/09/2017   Lab Results  Component Value Date   LDLCALC 78 02/25/2020   LDLCALC 79 09/30/2019   LDLCALC 97 08/09/2017   Lab Results  Component Value Date   TRIG 200 (H)  02/25/2020   TRIG 121 09/30/2019   TRIG 117 08/09/2017   Lab Results  Component Value Date   CHOLHDL 4.6 02/25/2020   CHOLHDL 4.7 09/30/2019   CHOLHDL 4.4 08/09/2017   No results found for: LDLDIRECT    Radiology: DG Abd 1 View  Result Date: 04/23/2021 CLINICAL DATA:  Check gastric catheter placement EXAM: ABDOMEN - 1 VIEW COMPARISON:  None. FINDINGS: Gastric catheter is noted within the stomach. No obstructive changes are seen. Degenerative changes of lumbar spine are noted. IMPRESSION: Gastric catheter within the stomach. Electronically Signed   By: Inez Catalina M.D.   On: 04/23/2021 23:42   CT Angio Chest PE W/Cm &/Or Wo Cm  Result Date: 04/23/2021 CLINICAL DATA:  PE suspected, high prob.  Chest pain, left arm pain EXAM: CT ANGIOGRAPHY CHEST WITH CONTRAST TECHNIQUE: Multidetector CT imaging of the chest was performed using the standard protocol during bolus administration of intravenous contrast. Multiplanar CT image reconstructions and MIPs were obtained to evaluate the vascular anatomy. CONTRAST:  64m OMNIPAQUE IOHEXOL 350 MG/ML SOLN COMPARISON:  09/29/2019 FINDINGS: Cardiovascular: Adequate opacification the pulmonary arterial tree. No intraluminal filling defect identified to suggest acute pulmonary embolism. Central pulmonary arteries are of normal caliber. Coronary artery bypass grafting has been performed. Global cardiac size within normal limits. Moderate degenerative calcification of the aortic valve leaflets. No pericardial effusion. Mild atherosclerotic calcification within the thoracic aorta. No aortic aneurysm. Mediastinum/Nodes: Endotracheal tube seen 3 cm above the carina. Nasogastric tube extends into the proximal body of the stomach. Several pathologically enlarged right hilar and subcarinal lymph nodes are identified. Index lymph node measures 16 mm within the a right hilar lymph node group and 18 mm within the subcarinal lymph node group at axial image # 49/4. Visualized  thyroid is unremarkable. Lungs/Pleura: Multifocal perihilar and  lower lung zone interstitial and ground-glass pulmonary infiltrates are identified, most in keeping with atypical infection in the appropriate clinical setting. Small bilateral pleural effusions are present. No pneumothorax. No central obstructing lesion. Upper Abdomen: No acute abnormality. Musculoskeletal: No acute bone abnormality. Osseous structures are age-appropriate. No lytic or blastic bone lesions are identified. Review of the MIP images confirms the above findings. IMPRESSION: No pulmonary embolism. Bilateral pulmonary infiltrates most in keeping with atypical infection in the appropriate clinical setting. Small bilateral pleural effusions. Status post coronary artery bypass grafting. Moderate degenerative calcification of the aortic valve leaflets. Echocardiography may be more helpful to assess the degree of valvular dysfunction. Pathologic right hilar and mediastinal adenopathy, possibly reactive in nature. Aortic Atherosclerosis (ICD10-I70.0). Electronically Signed   By: Fidela Salisbury M.D.   On: 04/23/2021 22:22   DG Chest Port 1 View  Result Date: 04/24/2021 CLINICAL DATA:  Intubation EXAM: PORTABLE CHEST 1 VIEW COMPARISON:  Portable chest 04/23/2021 at 8:26 p.m. FINDINGS: Enteric tube has been advanced. The proximal side-hole is no longer at the hiatal level but is also not in the field of view either. Endotracheal tube is in place with tip 5 cm from the carina and there are overlying monitor wires and translucent overlying electrical pad on the left. Left chest loop recorder device and CABG change. Cardiomegaly. There is perihilar vascular congestion, mild interstitial edema in the lower zones with improvement and regression of interstitial edema from the mid field areas. Small pleural effusions appear similar. There is persistent haziness in the lower lung fields which could be atelectasis, pneumonitis or ground-glass edema. No  current focal infiltrate in the mid and upper zones. IMPRESSION: 1. enteric tube has been advanced further into the stomach but the side hole and tip are not included in the field. 1. Other support devices as above with overlying monitor wires and electrical pads. 2. Improved interstitial edema, persistent small pleural effusions and persistent basilar hazy opacities. Electronically Signed   By: Telford Nab M.D.   On: 04/24/2021 04:42   DG Chest Portable 1 View  Result Date: 04/23/2021 CLINICAL DATA:  Check endotracheal tube placement EXAM: PORTABLE CHEST 1 VIEW COMPARISON:  Film from earlier in the same day. FINDINGS: Cardiac shadow is enlarged but stable. Postsurgical changes are again seen and stable. Endotracheal tube is noted 3 spot cm above the carina. Gastric catheter extends into the stomach although the proximal side port lies within the distal esophagus. This should be advanced several cm deeper into the stomach. Diffuse interstitial edema is noted worsened in the interval from the prior exam. No focal confluent infiltrate is seen. No bony abnormality is noted. IMPRESSION: Tubes and lines as described above. The gastric catheter should be advanced deeper into the stomach. Increasing edema. Electronically Signed   By: Inez Catalina M.D.   On: 04/23/2021 20:54   DG Chest Portable 1 View  Result Date: 04/23/2021 CLINICAL DATA:  Left arm and chest pain EXAM: PORTABLE CHEST 1 VIEW COMPARISON:  09/29/2019 FINDINGS: Single frontal view of the chest demonstrates persistent enlargement of the cardiac silhouette. Postsurgical changes from prior CABG. Loop recorder overlies the left mid chest. There is central vascular congestion and interstitial prominence consistent with interstitial edema and mild volume overload. No focal airspace disease, effusion, or pneumothorax. No acute bony abnormalities. IMPRESSION: 1. Interstitial edema.  No acute airspace disease. Electronically Signed   By: Randa Ngo  M.D.   On: 04/23/2021 19:48    EKG: Initial ECG: ST vs flutter;  diffuse ST depressions.   Repeat this AM: NSR, mild diffuse ST depressions.  Echo 02/2021- INTERPRETATION  MILD LV SYSTOLIC DYSFUNCTION (See above)   WITH MILD LVH  MILD RV SYSTOLIC DYSFUNCTION (See above)  MILD VALVULAR REGURGITATION (See above)  MODERATE VALVULAR STENOSIS (See above)  Aortic: MILD - MODERATE AS; AVA 0.74cm^2 AND MAX VELOCITY 3.14ms  AOV: MODERATELY THICKENED, PARTIALLY MOBILE RCC AND LCC; NCC APPEARS IMMOBILE  Mitral: MILD - MODERATE MR  Tricuspid: MILD TR (2.08m)  Pulmonic: NORMAL  LVH: MILD LVH  AVS: MODERATE AS  Mitral: MILD MR  Tricuspid: MILD TR  Closest EF: 50-55% (Estimated)  Contraction: Normal   NM stress 01/2021- SPECT images demonstrate homogeneous tracer  distribution throughout the myocardium. Defect type : Normal    Cath in 09/2018- Occluded RCA with R to R collaterals, occluded SVG to rPDA, 100% mid LAD, 80% D1, 80% mid Lcx. - Patent SVG to OM1/2 (sequential), LIMA patent.   ASSESSMENT AND PLAN:  StAntuan Limess a [age]o M with h/o CAD s/p CABG (x4 1997 SVG to rPDA occluded, SVG to OM1/2 sequential, LIMA to LAD), moderate AS, T2DM, HTN, seizure DO, PE on Xarelto who presented with interment left arm pain that worsened yesterday. He was discovered to be in a narrow complex tachycarrhythmia (thought to be atrial flutter). He subsequently decompensated requiring intubation.   # NSTEMI # CAD s/p 4v CABG 1997 # Narrow complex arrhythmia (flutter vs sinus tach) # Moderate AS # Acute hypoxic respiratory failure Known CAD as described above. Presented with left arm pain and narrow complex arrhythmia (A flutter vs sinus tach). Developed CP after admission, and subsequently decompensated requiring intubation; now improved and on BiPAP. EKG this morning with improved ST depression, in sinus rhtyhm at rate of 75 bpm. Troponin increased from normal to 12K thus far. Concerning for NSTEMI.  - Patient  will need LHC. Needs to improve from respiratory standpoint prior; he is chest pain free currently.  - Does not appear to have received aspirin; will give 324 mg aspirin, then 81 mg daily - Load with plavix 600 mg x 1, then 75 mg daily - DC lovenox; start heparin infusion - Holding home xarelto - Continue metoprolol  - Continue atorvastatin 40 mg daily - Echocardiogram ordered - Trend troponin until peak - Repeat EKG with changes in respiratory status or hemodynamic stability.   Signed: RYAndrez GrimeD 04/24/2021, 7:21 AM

## 2021-04-24 NOTE — Consult Note (Signed)
ANTICOAGULATION CONSULT NOTE - Initial Consult  Pharmacy Consult for heparin Indication: ACS  No Known Allergies  Patient Measurements: Height: 5\' 3"  (160 cm) Weight: 77.5 kg (170 lb 13.7 oz) IBW/kg (Calculated) : 56.9 Heparin Dosing Weight: 73.1 kg   Vital Signs: BP: 124/73 (11/20 0900) Pulse Rate: 80 (11/20 0900)  Labs: Recent Labs    04/23/21 1906 04/23/21 2239 04/24/21 0010 04/24/21 0651  HGB 11.2*  --   --  9.9*  HCT 36.1*  --   --  32.2*  PLT 207  --   --  188  APTT  --  20*  --   --   LABPROT  --  13.6  --   --   INR  --  1.0  --   --   HEPARINUNFRC  --  0.33  --   --   CREATININE 1.09  --   --  1.06  TROPONINIHS 32* 797* 2,925* 11,994*     Estimated Creatinine Clearance: 64 mL/min (by C-G formula based on SCr of 1.06 mg/dL).   Medical History: Past Medical History:  Diagnosis Date   CAD (coronary artery disease) of artery bypass graft    s/p CABG x 4 in 1997   COPD (chronic obstructive pulmonary disease) ([facility])    Coronary artery disease    CVA (cerebral vascular accident) ([facility])    Diabetes mellitus without complication ([facility])    GERD (gastroesophageal reflux disease)    History of single vessel coronary artery bypass    HLD (hyperlipidemia)    Hypertension    Seizures ([facility])     Medications:  Xarelto 20 mg daily with supper, last dose documented as "within the past week"  Assessment: 65 y.o. Moore with a past medical hx of PE on Xarelto presented with complaints of left arm pain as well as chest pain. EKG showed narrow complex rhythm with diffuse ST depression and HR 130. Cardioverting attempted but was unsuccesful and required intubation. Now extubated on BiPAP. Pharmacy has been consulted for heparin dosing.   Troponin continues to increase today (~3,000 > ~12,000). Baseline Hgb 11.2, plts 207, aPTT,  INR, and heparin level elevated at 0.33. Hemoglobin decreasing this morning, Plts stable. No mention of bleeding in notes.  Started on heparin,  then transitioned to enoxaparin x 1 dose (last dose 11/20 0813). Cardiology consulting pharmacy to restart heparin gtt. Per cardiology request, starting heparin 8-12 hours post-enoxaparin dose.  Goal of Therapy:  Heparin level 0.3-0.7 units/ml Monitor platelets by anticoagulation protocol: Yes   Plan:  Bolus 3,000 units (conservative dosing d/t enoxaparin dose with 8 hour hold) Start heparin infusion at 900 units/hr tonight @ ~1600 Check anti-Xa level and aPTT in 6 hours, may monitor with anti-Xa if levels correlate  Continue to monitor H&H and platelets   10-12, PharmD Pharmacy Resident  04/24/2021 9:43 AM

## 2021-04-24 NOTE — Progress Notes (Signed)
Pt was suctioned prior to extubation for a small amount of white secretions. Per Dr.'s order, he was extubated and placed on BIPAP. He is voicing and is without stridor.

## 2021-04-24 NOTE — Progress Notes (Signed)
Patient alert and oriented. No complaints of pain or shortness of breath. Extubated this am to bi-pap for 1 hour. On 2 liters of oxygen. Tolerating diet with no complications. Foley intact with adequate urine output. Per cardiology angiogram scheduled for tomorrow. Restarted heparin drip per orders. Patient NPO after midnight. Continue to assess.

## 2021-04-25 ENCOUNTER — Encounter
Admission: EM | Disposition: A | Discharge status: Home / Self Care | Payer: Self-pay | Source: Home / Self Care | Attending: Internal Medicine

## 2021-04-25 ENCOUNTER — Ambulatory Visit (INDEPENDENT_AMBULATORY_CARE_PROVIDER_SITE_OTHER): Payer: Medicare HMO

## 2021-04-25 ENCOUNTER — Encounter: Payer: Self-pay | Admitting: Cardiology

## 2021-04-25 DIAGNOSIS — I4892 Unspecified atrial flutter: Secondary | ICD-10-CM

## 2021-04-25 DIAGNOSIS — I350 Nonrheumatic aortic (valve) stenosis: Secondary | ICD-10-CM

## 2021-04-25 DIAGNOSIS — E669 Obesity, unspecified: Secondary | ICD-10-CM | POA: Diagnosis present

## 2021-04-25 DIAGNOSIS — I5023 Acute on chronic systolic (congestive) heart failure: Secondary | ICD-10-CM

## 2021-04-25 HISTORY — PX: BYPASS GRAFT ANGIOGRAPHY: CATH118229

## 2021-04-25 LAB — GLUCOSE, CAPILLARY
Glucose-Capillary: 242 mg/dL — ABNORMAL HIGH (ref 70–99)
Glucose-Capillary: 199 mg/dL — ABNORMAL HIGH (ref 70–99)
Glucose-Capillary: 153 mg/dL — ABNORMAL HIGH (ref 70–99)
Glucose-Capillary: 154 mg/dL — ABNORMAL HIGH (ref 70–99)
Glucose-Capillary: 147 mg/dL — ABNORMAL HIGH (ref 70–99)
Glucose-Capillary: 147 mg/dL — ABNORMAL HIGH (ref 70–99)
Glucose-Capillary: 177 mg/dL — ABNORMAL HIGH (ref 70–99)
Glucose-Capillary: 193 mg/dL — ABNORMAL HIGH (ref 70–99)
Glucose-Capillary: 148 mg/dL — ABNORMAL HIGH (ref 70–99)

## 2021-04-25 LAB — BASIC METABOLIC PANEL
Anion gap: 6 (ref 5–15)
BUN: 17 mg/dL (ref 8–23)
CO2: 28 mmol/L (ref 22–32)
Calcium: 8.7 mg/dL — ABNORMAL LOW (ref 8.9–10.3)
Chloride: 102 mmol/L (ref 98–111)
Creatinine, Ser: 1.31 mg/dL — ABNORMAL HIGH (ref 0.61–1.24)
GFR, Estimated: >60 mL/min (ref >60)
Glucose, Bld: 192 mg/dL — ABNORMAL HIGH (ref 70–99)
Potassium: 4.1 mmol/L (ref 3.5–5.1)
Sodium: 136 mmol/L (ref 135–145)

## 2021-04-25 LAB — CUP PACEART REMOTE DEVICE CHECK
Date Time Interrogation Session: 20221114173837
Implantable Pulse Generator Implant Date: 20210503

## 2021-04-25 LAB — CBC
HCT: 34.7 % — ABNORMAL LOW (ref 39.0–52.0)
Hemoglobin: 11 g/dL — ABNORMAL LOW (ref 13.0–17.0)
MCH: 26.8 pg (ref 26.0–34.0)
MCHC: 31.7 g/dL (ref 30.0–36.0)
MCV: 84.6 fL (ref 80.0–100.0)
Platelets: 196 10*3/uL (ref 150–400)
RBC: 4.1 MIL/uL — ABNORMAL LOW (ref 4.22–5.81)
RDW: 16.3 % — ABNORMAL HIGH (ref 11.5–15.5)
WBC: 6.9 10*3/uL (ref 4.0–10.5)
nRBC: 0 % (ref 0.0–0.2)

## 2021-04-25 LAB — HEPARIN LEVEL (UNFRACTIONATED): Heparin Unfractionated: 0.64 IU/mL (ref 0.30–0.70)

## 2021-04-25 LAB — PHOSPHORUS: Phosphorus: 4.1 mg/dL (ref 2.5–4.6)

## 2021-04-25 LAB — APTT: aPTT: 64 seconds — ABNORMAL HIGH (ref 24–36)

## 2021-04-25 LAB — MAGNESIUM: Magnesium: 1.7 mg/dL (ref 1.7–2.4)

## 2021-04-25 SURGERY — BYPASS GRAFT ANGIOGRAPHY
Anesthesia: Moderate Sedation

## 2021-04-25 MED ORDER — VERAPAMIL HCL 2.5 MG/ML IV SOLN
INTRAVENOUS | Status: AC
  Filled 2021-04-25: qty 2

## 2021-04-25 MED ORDER — MIDAZOLAM HCL 2 MG/2ML IJ SOLN
INTRAMUSCULAR | Status: DC | PRN
  Administered 2021-04-25: 1 mg via INTRAVENOUS

## 2021-04-25 MED ORDER — FUROSEMIDE 20 MG PO TABS
20.0000 mg | ORAL_TABLET | Freq: Two times a day (BID) | ORAL | Status: DC
  Administered 2021-04-25 – 2021-04-26 (×2): 20 mg via ORAL
  Filled 2021-04-25 (×2): qty 1

## 2021-04-25 MED ORDER — SODIUM CHLORIDE 0.9% FLUSH
3.0000 mL | Freq: Two times a day (BID) | INTRAVENOUS | Status: DC
  Administered 2021-04-25 – 2021-04-27 (×3): 3 mL via INTRAVENOUS

## 2021-04-25 MED ORDER — SODIUM CHLORIDE 0.9 % IV SOLN: 250.0000 mL | INTRAVENOUS | Status: DC | PRN

## 2021-04-25 MED ORDER — FENTANYL CITRATE (PF) 100 MCG/2ML IJ SOLN
INTRAMUSCULAR | Status: AC
  Filled 2021-04-25: qty 2

## 2021-04-25 MED ORDER — HEPARIN SODIUM (PORCINE) 1000 UNIT/ML IJ SOLN
INTRAMUSCULAR | Status: DC | PRN
  Administered 2021-04-25: 1000 [IU] via INTRAVENOUS
  Administered 2021-04-25: 3000 [IU] via INTRAVENOUS

## 2021-04-25 MED ORDER — SODIUM CHLORIDE 0.9% FLUSH: 3.0000 mL | INTRAVENOUS | Status: DC | PRN

## 2021-04-25 MED ORDER — HEPARIN SODIUM (PORCINE) 1000 UNIT/ML IJ SOLN
INTRAMUSCULAR | Status: AC
  Filled 2021-04-25: qty 1

## 2021-04-25 MED ORDER — ACETAMINOPHEN 325 MG PO TABS: 650.0000 mg | ORAL_TABLET | ORAL | Status: DC | PRN

## 2021-04-25 MED ORDER — ONDANSETRON HCL 4 MG/2ML IJ SOLN: 4.0000 mg | Freq: Four times a day (QID) | INTRAMUSCULAR | Status: DC | PRN

## 2021-04-25 MED ORDER — LIDOCAINE HCL (PF) 1 % IJ SOLN
INTRAMUSCULAR | Status: DC | PRN
  Administered 2021-04-25: 2 mL

## 2021-04-25 MED ORDER — FENTANYL CITRATE (PF) 100 MCG/2ML IJ SOLN
INTRAMUSCULAR | Status: DC | PRN
  Administered 2021-04-25: 25 ug via INTRAVENOUS

## 2021-04-25 MED ORDER — HEPARIN (PORCINE) IN NACL 1000-0.9 UT/500ML-% IV SOLN
INTRAVENOUS | Status: DC | PRN
  Administered 2021-04-25 (×2): 500 mL

## 2021-04-25 MED ORDER — MAGNESIUM SULFATE 2 GM/50ML IV SOLN
2.0000 g | Freq: Once | INTRAVENOUS | Status: AC
  Administered 2021-04-25: 2 g via INTRAVENOUS
  Filled 2021-04-25: qty 50

## 2021-04-25 MED ORDER — VERAPAMIL HCL 2.5 MG/ML IV SOLN
INTRAVENOUS | Status: DC | PRN
  Administered 2021-04-25: 2.5 mg via INTRA_ARTERIAL

## 2021-04-25 MED ORDER — IOHEXOL 350 MG/ML SOLN
INTRAVENOUS | Status: DC | PRN
  Administered 2021-04-25: 60 mL

## 2021-04-25 MED ORDER — ENOXAPARIN SODIUM 40 MG/0.4ML IJ SOSY
40.0000 mg | PREFILLED_SYRINGE | INTRAMUSCULAR | Status: DC
  Administered 2021-04-26 – 2021-04-27 (×2): 40 mg via SUBCUTANEOUS
  Filled 2021-04-25 (×2): qty 0.4

## 2021-04-25 MED ORDER — HEPARIN (PORCINE) IN NACL 1000-0.9 UT/500ML-% IV SOLN
INTRAVENOUS | Status: AC
  Filled 2021-04-25: qty 1000

## 2021-04-25 MED ORDER — LIDOCAINE HCL 1 % IJ SOLN
INTRAMUSCULAR | Status: AC
  Filled 2021-04-25: qty 20

## 2021-04-25 MED ORDER — MIDAZOLAM HCL 2 MG/2ML IJ SOLN
INTRAMUSCULAR | Status: AC
  Filled 2021-04-25: qty 2

## 2021-04-25 SURGICAL SUPPLY — 12 items
CATH INFINITI 5FR JL4 (CATHETERS) ×1 IMPLANT
CATH INFINITI JR4 5F (CATHETERS) ×1 IMPLANT
DEVICE RAD TR BAND REGULAR (VASCULAR PRODUCTS) ×1 IMPLANT
DRAPE BRACHIAL (DRAPES) ×1 IMPLANT
GLIDESHEATH SLEND SS 6F .021 (SHEATH) ×1 IMPLANT
GUIDEWIRE INQWIRE 1.5J.035X260 (WIRE) IMPLANT
INQWIRE 1.5J .035X260CM (WIRE) ×2
PACK CARDIAC CATH (CUSTOM PROCEDURE TRAY) ×2 IMPLANT
PROTECTION STATION PRESSURIZED (MISCELLANEOUS) ×2
SET ATX SIMPLICITY (MISCELLANEOUS) ×1 IMPLANT
STATION PROTECTION PRESSURIZED (MISCELLANEOUS) IMPLANT
WIRE HITORQ VERSACORE ST 145CM (WIRE) ×1 IMPLANT

## 2021-04-25 NOTE — Progress Notes (Signed)
Patient alert. No complaints of pain or shortness of breath. Room air. Tolerating diet. Foley removed, up to chair with left arm elevated. Continue to assess.

## 2021-04-25 NOTE — Progress Notes (Signed)
PROGRESS NOTE  Thomas Moore:[id number] DOB: 07/24/1955 DOA: 04/23/2021 PCP: Patient, No Pcp Per (Inactive)  HPI/Recap of past 25 hours: 65 year old male with past medical history of obesity, hypertension, CAD, systolic heart failure who presented to the emergency room on 11/19 complaining of chest pain and left arm pain and found to be in a flutter with diffuse ST depression.  Rate control attempted with Lopressor but patient continued to deteriorate and cardioversion was attempted x2 without effect.  Patient became hypoxic and was intubated and placed on ventilator, and admitted to the critical care service.  By 11/20, able to be extubated to BiPAP.  Seen by cardiology and felt to have had a non-STEMI.  Echocardiogram done noted moderate aortic stenosis and decreased ejection fraction of 40 to 45% compared to 19 months ago.  Currently patient getting diuretics and has diuresed over 3.5 L since admission.  Patient underwent left heart catheterization which noted no signs of occlusive disease as culprit for non-STEMI.  Assessment/Plan: Principal Problem:   Acute respiratory failure with hypoxia (HCC) secondary to flash pulmonary edema/acute on chronic systolic heart failure from atrial flutter from non-STEMI: Continue diuresis.  Cardiology following.  Left heart catheterization unremarkable for cause of non-STEMI.  Continue heparin. Active Problems:   Diabetes [facility]): Continue sliding scale.  CBGs under 200.    Essential hypertension: Systolic ranging from 250N to 150s.  Should improve as he continues to diurese.    Coronary artery disease involving native coronary artery of native heart without angina pectoris   Hypomagnesemia: Replace electrolytes as needed.   History of seizure   Elevated lipids continue statin   Hx of pulmonary embolus on chronic anticoagulation: Continue Xarelto  Acute kidney injury: Monitor closely.  May be from overdiuresis    Obesity (BMI 30-39.9): Meets  criteria with BMI greater than 30.  Code Status: Full code  Family Communication: Left message for brother  Disposition Plan: Try to wean off of oxygen.  Potential discharge tomorrow home.   Consultants: Cardiology Pulmonary/critical care  Procedures: Echocardiogram noting moderate aortic stenosis, ejection fraction 40-45% Left heart catheterization final report pending.  No evidence of reversible ischemia  Antimicrobials: None  DVT prophylaxis: Xarelto  Level of care: Med-Surg   Objective: Vitals:   04/25/21 0811 04/25/21 0842  BP: (!) 148/86   Pulse: 79   Resp: 18   Temp: 98.3 F (36.8 C)   SpO2: 98% 99%    Intake/Output Summary (Last 24 hours) at 04/25/2021 0918 Last data filed at 04/24/2021 1700 Gross per 24 hour  Intake --  Output 2500 ml  Net -2500 ml   Filed Weights   04/24/21 0500 04/24/21 1221 04/25/21 0500  Weight: 77.5 kg 77.9 kg 78.8 kg   Body mass index is 30.77 kg/m.  Exam:  General: Alert and oriented x2, no acute distress HEENT: Normocephalic and atraumatic, mucous membranes are slightly dry Cardiovascular: Irregular rhythm, rate controlled, 2 out of 6 systolic ejection murmur Respiratory: Clear to auscultation bilaterally Abdomen: Soft, nontender, nondistended, positive bowel sounds Musculoskeletal: No clubbing or cyanosis, trace pitting edema Skin: No skin breaks, tears or lesions Psychiatry: Appropriate, no evidence of psychoses.  Question mild cognitive issues? Neurology: No focal deficits   Data Reviewed: CBC: Recent Labs  Lab 04/23/21 1906 04/24/21 0651 04/25/21 0418  WBC 8.9 5.5 6.9  NEUTROABS 7.2  --   --   HGB 11.2* 9.9* 11.0*  HCT 36.1* 32.2* 34.7*  MCV 86.0 84.5 84.6  PLT 207 188 196  Basic Metabolic Panel: Recent Labs  Lab 04/23/21 1906 04/23/21 2239 04/24/21 0651 04/25/21 0418  NA 136  --  140 136  K 4.2  --  3.9 4.1  CL 99  --  104 102  CO2 25  --  27 28  GLUCOSE 280*  --  130* 192*  BUN 18  --  15  17  CREATININE 1.09  --  1.06 1.31*  CALCIUM 8.8*  --  8.5* 8.7*  MG 1.4* 1.3* 2.4 1.7  PHOS  --   --  3.7 4.1   GFR: Estimated Creatinine Clearance: 52.2 mL/min (A) (by C-G formula based on SCr of 1.31 mg/dL (H)). Liver Function Tests: Recent Labs  Lab 04/23/21 1906  AST 20  ALT 14  ALKPHOS 75  BILITOT 0.7  PROT 7.6  ALBUMIN 3.5   No results for input(s): LIPASE, AMYLASE in the last 168 hours. No results for input(s): AMMONIA in the last 168 hours. Coagulation Profile: Recent Labs  Lab 04/23/21 2239  INR 1.0   Cardiac Enzymes: No results for input(s): CKTOTAL, CKMB, CKMBINDEX, TROPONINI in the last 168 hours. BNP (last 3 results) No results for input(s): PROBNP in the last 8760 hours. HbA1C: Recent Labs    04/24/21 0651  HGBA1C 6.2*   CBG: Recent Labs  Lab 04/24/21 1544 04/24/21 1906 04/24/21 2303 04/25/21 0503 04/25/21 0726  GLUCAP 256* 177* 220* 199* 153*   Lipid Profile: Recent Labs    04/24/21 0651  TRIG 181*   Thyroid Function Tests: Recent Labs    04/23/21 1906 04/24/21 0651  TSH 5.958*  --   FREET4  --  0.93   Anemia Panel: No results for input(s): VITAMINB12, FOLATE, FERRITIN, TIBC, IRON, RETICCTPCT in the last 72 hours. Urine analysis:    Component Value Date/Time   COLORURINE YELLOW 09/29/2019 0910   APPEARANCEUR Clear 05/12/2020 0940   LABSPEC 1.016 09/29/2019 0910   PHURINE 5.0 09/29/2019 0910   GLUCOSEU 3+ (A) 05/12/2020 0940   HGBUR NEGATIVE 09/29/2019 0910   BILIRUBINUR Negative 05/12/2020 0940   KETONESUR 20 (A) 09/29/2019 0910   PROTEINUR 1+ (A) 05/12/2020 0940   PROTEINUR 100 (A) 09/29/2019 0910   NITRITE Negative 05/12/2020 0940   NITRITE NEGATIVE 09/29/2019 0910   LEUKOCYTESUR Negative 05/12/2020 0940   LEUKOCYTESUR NEGATIVE 09/29/2019 0910   Sepsis Labs: '@LABRCNTIP' (procalcitonin:4,lacticidven:4)  ) Recent Results (from the past 240 hour(s))  Resp Panel by RT-PCR (Flu A&B, Covid) Nasopharyngeal Swab     Status:  None   Collection Time: 04/23/21  7:22 PM   Specimen: Nasopharyngeal Swab; Nasopharyngeal(NP) swabs in vial transport medium  Result Value Ref Range Status   SARS Coronavirus 2 by RT PCR NEGATIVE NEGATIVE Final    Comment: (NOTE) SARS-CoV-2 target nucleic acids are NOT DETECTED.  The SARS-CoV-2 RNA is generally detectable in upper respiratory specimens during the acute phase of infection. The lowest concentration of SARS-CoV-2 viral copies this assay can detect is 138 copies/mL. A negative result does not preclude SARS-Cov-2 infection and should not be used as the sole basis for treatment or other patient management decisions. A negative result may occur with  improper specimen collection/handling, submission of specimen other than nasopharyngeal swab, presence of viral mutation(s) within the areas targeted by this assay, and inadequate number of viral copies(<138 copies/mL). A negative result must be combined with clinical observations, patient history, and epidemiological information. The expected result is Negative.  Fact Sheet for Patients:  EntrepreneurPulse.com.au  Fact Sheet for Healthcare Providers:  IncredibleEmployment.be  This test is no t yet approved or cleared by the [place] and  has been authorized for detection and/or diagnosis of SARS-CoV-2 by FDA under an Emergency Use Authorization (EUA). This EUA will remain  in effect (meaning this test can be used) for the duration of the COVID-19 declaration under Section 564(b)(1) of the Act, 21 U.S.C.section 360bbb-3(b)(1), unless the authorization is terminated  or revoked sooner.       Influenza A by PCR NEGATIVE NEGATIVE Final   Influenza B by PCR NEGATIVE NEGATIVE Final    Comment: (NOTE) The Xpert Xpress SARS-CoV-2/FLU/RSV plus assay is intended as an aid in the diagnosis of influenza from Nasopharyngeal swab specimens and should not be used as a sole basis for  treatment. Nasal washings and aspirates are unacceptable for Xpert Xpress SARS-CoV-2/FLU/RSV testing.  Fact Sheet for Patients: EntrepreneurPulse.com.au  Fact Sheet for Healthcare Providers: IncredibleEmployment.be  This test is not yet approved or cleared by the [place] FDA and has been authorized for detection and/or diagnosis of SARS-CoV-2 by FDA under an Emergency Use Authorization (EUA). This EUA will remain in effect (meaning this test can be used) for the duration of the COVID-19 declaration under Section 564(b)(1) of the Act, 21 U.S.C. section 360bbb-3(b)(1), unless the authorization is terminated or revoked.  Performed at [facility], [address] [place],  [address]       Studies: ECHOCARDIOGRAM COMPLETE  Result Date: 04/24/2021    ECHOCARDIOGRAM REPORT   Patient Name:   Thomas Moore Date of Exam: 04/24/2021 Medical Rec #:  [id number]      Height:       63.0 in Accession #:    [id number]     Weight:       170.9 lb Date of Birth:  12-09-1955       BSA:          1.808 m Patient Age:    69 years       BP:           111/67 mmHg Patient Gender: M              HR:           92 bpm. Exam Location:  [facility] Procedure: 2D Echo Indications:     Cardiomyopathy I42.9  History:         Patient has prior history of Echocardiogram examinations, most                  recent 09/30/2019.  Sonographer:     Kathlen Brunswick RDCS Referring Phys:  [id number] BRITTON L RUST-CHESTER Diagnosing Phys: Ida Rogue MD IMPRESSIONS  1. Left ventricular ejection fraction, by estimation, is 45 to 50%. The left ventricle has mildly decreased function. The left ventricle demonstrates global hypokinesis. There is moderate left ventricular hypertrophy. Left ventricular diastolic parameters are consistent with Grade II diastolic dysfunction (pseudonormalization).  2. Right ventricular systolic function is normal. The right ventricular size is normal.  Tricuspid regurgitation signal is inadequate for assessing PA pressure.  3. Left atrial size was mildly dilated.  4. The mitral valve is normal in structure. Mild mitral valve regurgitation. No evidence of mitral stenosis.  5. The aortic valve is calcified. Aortic valve regurgitation is not visualized. Moderate aortic valve stenosis. Aortic valve area, by VTI measures 0.77 cm. Aortic valve mean gradient measures 19.8 mmHg. Aortic valve Vmax measures 3.03 m/s.  6. The inferior vena cava is normal in size with greater than 50%  respiratory variability, suggesting right atrial pressure of 3 mmHg. FINDINGS  Left Ventricle: Left ventricular ejection fraction, by estimation, is 45 to 50%. The left ventricle has mildly decreased function. The left ventricle demonstrates global hypokinesis. The left ventricular internal cavity size was normal in size. There is  moderate left ventricular hypertrophy. Left ventricular diastolic parameters are consistent with Grade II diastolic dysfunction (pseudonormalization). Right Ventricle: The right ventricular size is normal. No increase in right ventricular wall thickness. Right ventricular systolic function is normal. Tricuspid regurgitation signal is inadequate for assessing PA pressure. Left Atrium: Left atrial size was mildly dilated. Right Atrium: Right atrial size was normal in size. Pericardium: There is no evidence of pericardial effusion. Mitral Valve: The mitral valve is normal in structure. Mild mitral annular calcification. Mild mitral valve regurgitation. No evidence of mitral valve stenosis. Tricuspid Valve: The tricuspid valve is normal in structure. Tricuspid valve regurgitation is not demonstrated. No evidence of tricuspid stenosis. Aortic Valve: The aortic valve is calcified. Aortic valve regurgitation is not visualized. Moderate aortic stenosis is present. Aortic valve mean gradient measures 19.8 mmHg. Aortic valve peak gradient measures 36.7 mmHg. Aortic valve area,  by VTI measures 0.77 cm. Pulmonic Valve: The pulmonic valve was normal in structure. Pulmonic valve regurgitation is not visualized. No evidence of pulmonic stenosis. Aorta: The aortic root is normal in size and structure. Venous: The inferior vena cava is normal in size with greater than 50% respiratory variability, suggesting right atrial pressure of 3 mmHg. IAS/Shunts: No atrial level shunt detected by color flow Doppler.  LEFT VENTRICLE PLAX 2D LVIDd:         5.70 cm     Diastology LVIDs:         4.50 cm     LV e' medial:    3.15 cm/s LV PW:         1.30 cm     LV E/e' medial:  34.3 LV IVS:        1.30 cm     LV e' lateral:   2.39 cm/s LVOT diam:     2.00 cm     LV E/e' lateral: 45.2 LV SV:         44 LV SV Index:   24 LVOT Area:     3.14 cm  LV Volumes (MOD) LV vol d, MOD A4C: 97.6 ml LV vol s, MOD A4C: 48.2 ml LV SV MOD A4C:     97.6 ml RIGHT VENTRICLE RV Basal diam:  2.50 cm RV S prime:     11.60 cm/s TAPSE (M-mode): 1.7 cm LEFT ATRIUM             Index        RIGHT ATRIUM           Index LA diam:        3.50 cm 1.94 cm/m   RA Area:     11.50 cm LA Vol (A2C):   33.0 ml 18.25 ml/m  RA Volume:   26.60 ml  14.71 ml/m LA Vol (A4C):   37.2 ml 20.57 ml/m LA Biplane Vol: 36.9 ml 20.40 ml/m  AORTIC VALVE                     PULMONIC VALVE AV Area (Vmax):    0.76 cm      PV Vmax:       0.92 m/s AV Area (Vmean):   0.70 cm      PV Peak grad:  3.4 mmHg AV Area (VTI):     0.77 cm AV Vmax:           303.00 cm/s AV Vmean:          203.750 cm/s AV VTI:            0.572 m AV Peak Grad:      36.7 mmHg AV Mean Grad:      19.8 mmHg LVOT Vmax:         73.10 cm/s LVOT Vmean:        45.300 cm/s LVOT VTI:          0.140 m LVOT/AV VTI ratio: 0.24  AORTA Ao Root diam: 3.60 cm MITRAL VALVE MV Area (PHT): 3.46 cm     SHUNTS MV Decel Time: 219 msec     Systemic VTI:  0.14 m MV E velocity: 108.00 cm/s  Systemic Diam: 2.00 cm MV A velocity: 74.60 cm/s MV E/A ratio:  1.45 Ida Rogue MD Electronically signed by Ida Rogue  MD Signature Date/Time: 04/24/2021/12:49:49 PM    Final     Scheduled Meds:  [MAR Hold] aspirin  81 mg Oral Daily   [MAR Hold] atorvastatin  40 mg Oral QHS   [MAR Hold] chlorhexidine gluconate (MEDLINE KIT)  15 mL Mouth Rinse BID   [MAR Hold] Chlorhexidine Gluconate Cloth  6 each Topical Daily   [MAR Hold] clopidogrel  75 mg Oral Daily   [MAR Hold] divalproex  250 mg Oral Daily   [MAR Hold] divalproex  500 mg Oral QHS   [MAR Hold] docusate  100 mg Per Tube BID   [MAR Hold] insulin aspart  0-15 Units Subcutaneous Q4H   [MAR Hold] insulin glargine-yfgn  5 Units Subcutaneous Daily   [MAR Hold] mouth rinse  15 mL Mouth Rinse 10 times per day   [facility] Hold] metoprolol tartrate  25 mg Oral Q6H   [MAR Hold] polyethylene glycol  17 g Per Tube Daily   [MAR Hold] sodium chloride flush  3 mL Intravenous Q12H    Continuous Infusions:  sodium chloride     sodium chloride 10 mL/hr at 04/25/21 0511   heparin Stopped (04/25/21 0810)     LOS: 2 days     Annita Brod, MD [facility]   04/25/2021, 9:18 AM

## 2021-04-25 NOTE — Consult Note (Signed)
ANTICOAGULATION CONSULT NOTE  Pharmacy Consult for heparin Indication: ACS  No Known Allergies  Patient Measurements: Height: 5\' 3"  (160 cm) Weight: 78.8 kg (173 lb 11.6 oz) IBW/kg (Calculated) : 56.9 Heparin Dosing Weight: 73.1 kg   Vital Signs: Temp: 98.6 F (37 C) (11/21 0000) Temp Source: Oral (11/21 0000) BP: 146/86 (11/21 0000) Pulse Rate: 97 (11/21 0000)  Labs: Recent Labs    04/23/21 1906 04/23/21 2239 04/24/21 0010 04/24/21 0651 04/24/21 1130 04/24/21 2301 04/25/21 0418  HGB 11.2*  --   --  9.9*  --   --  11.0*  HCT 36.1*  --   --  32.2*  --   --  34.7*  PLT 207  --   --  188  --   --  196  APTT  --  20*  --   --   --  66* 64*  LABPROT  --  13.6  --   --   --   --   --   INR  --  1.0  --   --   --   --   --   HEPARINUNFRC  --  0.33  --   --   --  0.78* 0.64  CREATININE 1.09  --   --  1.06  --   --  1.31*  TROPONINIHS 32* 797* 2,925* 11,994* 10,491*  --   --      Estimated Creatinine Clearance: 52.2 mL/min (A) (by C-G formula based on SCr of 1.31 mg/dL (H)).   Medical History: Past Medical History:  Diagnosis Date   CAD (coronary artery disease) of artery bypass graft    s/p CABG x 4 in 1997   COPD (chronic obstructive pulmonary disease) (HCC)    Coronary artery disease    CVA (cerebral vascular accident) (HCC)    Diabetes mellitus without complication (HCC)    GERD (gastroesophageal reflux disease)    History of single vessel coronary artery bypass    HLD (hyperlipidemia)    Hypertension    Seizures (HCC)     Medications:  Xarelto 20 mg daily with supper, last dose documented as "within the past week"  Assessment: 65 y.o. male with a past medical hx of PE on Xarelto presented with complaints of left arm pain as well as chest pain. EKG showed narrow complex rhythm with diffuse ST depression and HR 130. Cardioverting attempted but was unsuccesful and required intubation. Now extubated on BiPAP. Pharmacy has been consulted for heparin dosing.    Troponin continues to increase today (~3,000 > ~12,000). Baseline Hgb 11.2, plts 207, aPTT,  INR, and heparin level elevated at 0.33. Hemoglobin decreasing this morning, Plts stable. No mention of bleeding in notes.  Started on heparin, then transitioned to enoxaparin x 1 dose (last dose 11/20 0813). Cardiology consulting pharmacy to restart heparin gtt. Per cardiology request, starting heparin 8-12 hours post-enoxaparin dose.  Goal of Therapy:  Heparin level 0.3-0.7 units/ml Monitor platelets by anticoagulation protocol: Yes  1120 2301 aPTT 66, therapeutic, HL 0.78, supra 1121 0418 aPTT 64 subthera, HL 0.64, thera   Plan:  HL and aPTT still not quite correlating at this time. Will increase heparin infusion to 1000 units/hr Will recheck HL and aPTT in 6 hrs after rate change, may monitor with anti-Xa if levels correlate  Continue to monitor H&H and platelets  10-12, PharmD, [facility] 04/25/2021 5:48 AM

## 2021-04-25 NOTE — Progress Notes (Signed)
Dr. Rito Ehrlich notified patients foley removed at 16:30. When patient used urinal it appeared he had a small amount of pink/red blood. Continue to assess.

## 2021-04-25 NOTE — Consult Note (Signed)
PHARMACY CONSULT NOTE - FOLLOW UP  Pharmacy Consult for Electrolyte Monitoring and Replacement   Recent Labs: Potassium (mmol/L)  Date Value  04/25/2021 4.1   Magnesium (mg/dL)  Date Value  35/45/6256 1.7   Calcium (mg/dL)  Date Value  38/93/7342 8.7 (L)   Albumin (g/dL)  Date Value  87/68/1157 3.5  02/25/2020 4.1   Phosphorus (mg/dL)  Date Value  26/20/3559 4.1   Sodium (mmol/L)  Date Value  04/25/2021 136  02/25/2020 138     Assessment: 65yo male w/ h/o PE (on Xarelto), CVA, CAD (CABG x4; '97) p/w c/o left arm pain as well as chest pain . EKG showed narrow complex rhythm with diffuse ST depression and HR 130. Cardioverting attempted but was unsuccesful and required intubation. Now extubated on BiPAP  Goal of Therapy:  Lytes WNL  Plan:  Scr: 1.06> 1.31, but Lytes remain WNL. No repletion req'd at this time Will CTM and adjust PRN. Labs ordered.  Martyn Malay, PharmD, [facility] Clinical Pharmacist 04/25/2021 8:17 AM

## 2021-04-25 NOTE — Consult Note (Signed)
[facility] Cardiology  CARDIOLOGY CONSULT NOTE  Patient ID: Thomas Moore MRN: [id number] DOB/AGE: [birth year] 65 y.o.  Admit date: 04/23/2021 Referring Physician Otelia Limes Primary Physician Patient, No Pcp Per (Inactive) Primary Cardiologist Barnabas Harries, MD Reason for Consultation SVT, NSTEMI, respiratory failure  HPI:  Thomas Moore is a 65 yo M with h/o CAD s/p CABG (x4 1997 SVG to rPDA occluded, SVG to OM1/2 sequential, LIMA to LAD), moderate AS, T2DM, HTN, seizure DO, PE on Xarelto who presented with interment left arm pain that worsened yesterday. He was discovered to be in a narrow complex tachycarrhythmia (thought to be atrial flutter). He subsequently decompensated requiring intubation. Discovered to have NSTEMI.   Interval History: - No chest or arm pain since presentation.  - Breathing is improved. Comfortably on 2L O2.  - No acute events overnight.   Review of systems complete and found to be negative unless listed above     Past Medical History:  Diagnosis Date   CAD (coronary artery disease) of artery bypass graft    s/p CABG x 4 in 1997   COPD (chronic obstructive pulmonary disease) (HCC)    Coronary artery disease    CVA (cerebral vascular accident) ([place])    Diabetes mellitus without complication ([place])    GERD (gastroesophageal reflux disease)    History of single vessel coronary artery bypass    HLD (hyperlipidemia)    Hypertension    Seizures ([place])     Past Surgical History:  Procedure Laterality Date   CARDIAC SURGERY     CORNEAL TRANSPLANT Right    CORONARY ARTERY BYPASS GRAFT  1997   EYE SURGERY     FLEXOR TENDON REPAIR Left 07/11/2019   Procedure: FLEXOR tenolysis  REPAIR LEFT RING FINGER with tednon repair;  Surgeon: Hessie Knows, MD;  Location: [facility] ORS;  Service: Orthopedics;  Laterality: Left;   INCISION AND DRAINAGE ABSCESS Left 05/08/2019   Procedure: INCISION AND DRAINAGE ABSCESS;  Surgeon: Dereck Leep, MD;  Location: [facility] ORS;  Service:  Orthopedics;  Laterality: Left;   LEFT HEART CATH AND CORS/GRAFTS ANGIOGRAPHY N/A 10/02/2019   Procedure: LEFT HEART CATH AND CORS/GRAFTS ANGIOGRAPHY;  Surgeon: Leonie Man, MD;  Location: [place] [facility];  Service: Cardiovascular;  Laterality: N/A;   LOOP RECORDER INSERTION N/A 10/06/2019   Procedure: LOOP RECORDER INSERTION;  Surgeon: Evans Lance, MD;  Location: [place] [facility];  Service: Cardiovascular;  Laterality: N/A;    Medications Prior to Admission  Medication Sig Dispense Refill Last Dose   atorvastatin (LIPITOR) 40 MG tablet TAKE ONE TABLET BY MOUTH AT BEDTIME 30 tablet 3 Past Week   blood glucose meter kit and supplies KIT Dispense based on patient and insurance preference. Use up to four times daily as directed. (FOR ICD-9 250.00, 250.01). 1 each 0 Past Week   calcium citrate-vitamin D (CITRACAL+D) 315-200 MG-UNIT tablet Take 1 tablet by mouth 2 (two) times daily.   Past Week   Cysteamine Bitartrate (PROCYSBI) 300 MG PACK as directed And strips and lancets for qd check for dm ii   Past Week   divalproex (DEPAKOTE ER) 250 MG 24 hr tablet Take 250 mg by mouth daily.   Past Week   divalproex (DEPAKOTE) 500 MG DR tablet Take 500 mg by mouth daily as needed.   prn at prn   glipiZIDE (GLUCOTROL XL) 10 MG 24 hr tablet TAKE ONE TABLET BY MOUTH 2 TIMES A DAY 60 tablet 3 Past Week   ipratropium (ATROVENT)  0.06 % nasal spray Place 2 sprays into both nostrils 3 (three) times daily.   Past Week   isosorbide mononitrate (IMDUR) 30 MG 24 hr tablet Take 30 mg by mouth daily.   Past Week   lisinopril (ZESTRIL) 40 MG tablet TAKE ONE TABLET BY MOUTH EVERY DAY 30 tablet 1 Past Week   metFORMIN (GLUCOPHAGE-XR) 500 MG 24 hr tablet TAKE TWO TABLETS BY MOUTH 2 TIMES A DAY 120 tablet 3 Past Week   metoprolol succinate (TOPROL-XL) 100 MG 24 hr tablet Take 1 tablet (100 mg total) by mouth daily. 30 tablet 3 Past Week   Multiple Vitamin (MULTIVITAMIN) capsule Take 1 capsule by mouth daily.   Past  Week   nitroGLYCERIN (NITROSTAT) 0.4 MG SL tablet Place under the tongue.   prn at prn   omeprazole (PRILOSEC) 40 MG capsule TAKE ONE CAPSULE BY MOUTH EVERY DAY 30 capsule 3 Past Week   ramipril (ALTACE) 10 MG capsule Take 10 mg by mouth daily.   Past Week   TRULICITY 9.39 QZ/0.0PQ SOPN Inject 0.75 mg into the skin once a week. Wednesday 1 mL 4 Past Week   XARELTO 20 MG TABS tablet Take 20 mg by mouth daily with supper.   Past Week   Social History   Socioeconomic History   Marital status: Single    Spouse name: Not on file   Number of children: 0   Years of education: Not on file   Highest education level: High school graduate  Occupational History   Occupation: Unemployed    Comment: Trying to get disability  Tobacco Use   Smoking status: Former    Types: Cigarettes   Smokeless tobacco: Never   Tobacco comments:    Quit 40 years ago  Vaping Use   Vaping Use: Never used  Substance and Sexual Activity   Alcohol use: Not Currently   Drug use: Never   Sexual activity: Never  Other Topics Concern   Not on file  Social History Narrative   ** Merged History Encounter **    Lives in an apartment on [address], has to climb 17 floors   Social Determinants of Health   Financial Resource Strain: Not on file  Food Insecurity: Not on file  Transportation Needs: Not on file  Physical Activity: Not on file  Stress: Not on file  Social Connections: Not on file  Intimate Partner Violence: Not on file    Family History  Problem Relation Age of Onset   Seizures Brother       Review of systems complete and found to be negative unless listed above      PHYSICAL EXAM  General: Well developed, well nourished, in no acute distress. BiPAP in place. HEENT:  Normocephalic and atramatic Neck:  No JVD.  Lungs: Clear bilaterally to auscultation and percussion. Wearing BiPAP. Heart: HRRR . 3/6 systolic murmur loudest at RUSB, diminished s2.  Abdomen: Bowel sounds are positive,  abdomen soft and non-tender  Msk:  Back normal, normal gait. Normal strength and tone for age. Extremities: No clubbing, cyanosis or edema.   Neuro: Alert and oriented X 3. Psych:  Good affect, responds appropriately  Labs:   Lab Results  Component Value Date   WBC 6.9 04/25/2021   HGB 11.0 (L) 04/25/2021   HCT 34.7 (L) 04/25/2021   MCV 84.6 04/25/2021   PLT 196 04/25/2021    Recent Labs  Lab 04/23/21 1906 04/24/21 0651 04/25/21 0418  NA 136   < >  136  K 4.2   < > 4.1  CL 99   < > 102  CO2 25   < > 28  BUN 18   < > 17  CREATININE 1.09   < > 1.31*  CALCIUM 8.8*   < > 8.7*  PROT 7.6  --   --   BILITOT 0.7  --   --   ALKPHOS 75  --   --   ALT 14  --   --   AST 20  --   --   GLUCOSE 280*   < > 192*   < > = values in this interval not displayed.    Lab Results  Component Value Date   TROPONINI <0.03 08/08/2017     Lab Results  Component Value Date   CHOL 143 02/25/2020   CHOL 131 09/30/2019   CHOL 155 08/09/2017   Lab Results  Component Value Date   HDL 31 (L) 02/25/2020   HDL 28 (L) 09/30/2019   HDL 35 (L) 08/09/2017   Lab Results  Component Value Date   LDLCALC 78 02/25/2020   LDLCALC 79 09/30/2019   LDLCALC 97 08/09/2017   Lab Results  Component Value Date   TRIG 181 (H) 04/24/2021   TRIG 200 (H) 02/25/2020   TRIG 121 09/30/2019   Lab Results  Component Value Date   CHOLHDL 4.6 02/25/2020   CHOLHDL 4.7 09/30/2019   CHOLHDL 4.4 08/09/2017   No results found for: LDLDIRECT    Radiology: DG Abd 1 View  Result Date: 04/23/2021 CLINICAL DATA:  Check gastric catheter placement EXAM: ABDOMEN - 1 VIEW COMPARISON:  None. FINDINGS: Gastric catheter is noted within the stomach. No obstructive changes are seen. Degenerative changes of lumbar spine are noted. IMPRESSION: Gastric catheter within the stomach. Electronically Signed   By: Inez Catalina M.D.   On: 04/23/2021 23:42   CT Angio Chest PE W/Cm &/Or Wo Cm  Result Date: 04/23/2021 CLINICAL DATA:   PE suspected, high prob.  Chest pain, left arm pain EXAM: CT ANGIOGRAPHY CHEST WITH CONTRAST TECHNIQUE: Multidetector CT imaging of the chest was performed using the standard protocol during bolus administration of intravenous contrast. Multiplanar CT image reconstructions and MIPs were obtained to evaluate the vascular anatomy. CONTRAST:  64m OMNIPAQUE IOHEXOL 350 MG/ML SOLN COMPARISON:  09/29/2019 FINDINGS: Cardiovascular: Adequate opacification the pulmonary arterial tree. No intraluminal filling defect identified to suggest acute pulmonary embolism. Central pulmonary arteries are of normal caliber. Coronary artery bypass grafting has been performed. Global cardiac size within normal limits. Moderate degenerative calcification of the aortic valve leaflets. No pericardial effusion. Mild atherosclerotic calcification within the thoracic aorta. No aortic aneurysm. Mediastinum/Nodes: Endotracheal tube seen 3 cm above the carina. Nasogastric tube extends into the proximal body of the stomach. Several pathologically enlarged right hilar and subcarinal lymph nodes are identified. Index lymph node measures 16 mm within the a right hilar lymph node group and 18 mm within the subcarinal lymph node group at axial image # 49/4. Visualized thyroid is unremarkable. Lungs/Pleura: Multifocal perihilar and lower lung zone interstitial and ground-glass pulmonary infiltrates are identified, most in keeping with atypical infection in the appropriate clinical setting. Small bilateral pleural effusions are present. No pneumothorax. No central obstructing lesion. Upper Abdomen: No acute abnormality. Musculoskeletal: No acute bone abnormality. Osseous structures are age-appropriate. No lytic or blastic bone lesions are identified. Review of the MIP images confirms the above findings. IMPRESSION: No pulmonary embolism. Bilateral pulmonary infiltrates most in keeping with  atypical infection in the appropriate clinical setting. Small  bilateral pleural effusions. Status post coronary artery bypass grafting. Moderate degenerative calcification of the aortic valve leaflets. Echocardiography may be more helpful to assess the degree of valvular dysfunction. Pathologic right hilar and mediastinal adenopathy, possibly reactive in nature. Aortic Atherosclerosis (ICD10-I70.0). Electronically Signed   By: Fidela Salisbury M.D.   On: 04/23/2021 22:22   DG Chest Port 1 View  Result Date: 04/24/2021 CLINICAL DATA:  Intubation EXAM: PORTABLE CHEST 1 VIEW COMPARISON:  Portable chest 04/23/2021 at 8:26 p.m. FINDINGS: Enteric tube has been advanced. The proximal side-hole is no longer at the hiatal level but is also not in the field of view either. Endotracheal tube is in place with tip 5 cm from the carina and there are overlying monitor wires and translucent overlying electrical pad on the left. Left chest loop recorder device and CABG change. Cardiomegaly. There is perihilar vascular congestion, mild interstitial edema in the lower zones with improvement and regression of interstitial edema from the mid field areas. Small pleural effusions appear similar. There is persistent haziness in the lower lung fields which could be atelectasis, pneumonitis or ground-glass edema. No current focal infiltrate in the mid and upper zones. IMPRESSION: 1. enteric tube has been advanced further into the stomach but the side hole and tip are not included in the field. 1. Other support devices as above with overlying monitor wires and electrical pads. 2. Improved interstitial edema, persistent small pleural effusions and persistent basilar hazy opacities. Electronically Signed   By: Telford Nab M.D.   On: 04/24/2021 04:42   DG Chest Portable 1 View  Result Date: 04/23/2021 CLINICAL DATA:  Check endotracheal tube placement EXAM: PORTABLE CHEST 1 VIEW COMPARISON:  Film from earlier in the same day. FINDINGS: Cardiac shadow is enlarged but stable. Postsurgical changes  are again seen and stable. Endotracheal tube is noted 3 spot cm above the carina. Gastric catheter extends into the stomach although the proximal side port lies within the distal esophagus. This should be advanced several cm deeper into the stomach. Diffuse interstitial edema is noted worsened in the interval from the prior exam. No focal confluent infiltrate is seen. No bony abnormality is noted. IMPRESSION: Tubes and lines as described above. The gastric catheter should be advanced deeper into the stomach. Increasing edema. Electronically Signed   By: Inez Catalina M.D.   On: 04/23/2021 20:54   DG Chest Portable 1 View  Result Date: 04/23/2021 CLINICAL DATA:  Left arm and chest pain EXAM: PORTABLE CHEST 1 VIEW COMPARISON:  09/29/2019 FINDINGS: Single frontal view of the chest demonstrates persistent enlargement of the cardiac silhouette. Postsurgical changes from prior CABG. Loop recorder overlies the left mid chest. There is central vascular congestion and interstitial prominence consistent with interstitial edema and mild volume overload. No focal airspace disease, effusion, or pneumothorax. No acute bony abnormalities. IMPRESSION: 1. Interstitial edema.  No acute airspace disease. Electronically Signed   By: Randa Ngo M.D.   On: 04/23/2021 19:48   ECHOCARDIOGRAM COMPLETE  Result Date: 04/24/2021    ECHOCARDIOGRAM REPORT   Patient Name:   BRECKIN SAVANNAH Bruschi Date of Exam: 04/24/2021 Medical Rec #:  [id number]      Height:       63.0 in Accession #:    [id number]     Weight:       170.9 lb Date of Birth:  1956-03-30       BSA:  1.808 m Patient Age:    54 years       BP:           111/67 mmHg Patient Gender: M              HR:           92 bpm. Exam Location:  [facility] Procedure: 2D Echo Indications:     Cardiomyopathy I42.9  History:         Patient has prior history of Echocardiogram examinations, most                  recent 09/30/2019.  Sonographer:     Kathlen Brunswick RDCS Referring Phys:  [id number]  BRITTON L R[facility] Diagnosing Phys: Ida Rogue MD IMPRESSIONS  1. Left ventricular ejection fraction, by estimation, is 45 to 50%. The left ventricle has mildly decreased function. The left ventricle demonstrates global hypokinesis. There is moderate left ventricular hypertrophy. Left ventricular diastolic parameters are consistent with Grade II diastolic dysfunction (pseudonormalization).  2. Right ventricular systolic function is normal. The right ventricular size is normal. Tricuspid regurgitation signal is inadequate for assessing PA pressure.  3. Left atrial size was mildly dilated.  4. The mitral valve is normal in structure. Mild mitral valve regurgitation. No evidence of mitral stenosis.  5. The aortic valve is calcified. Aortic valve regurgitation is not visualized. Moderate aortic valve stenosis. Aortic valve area, by VTI measures 0.77 cm. Aortic valve mean gradient measures 19.8 mmHg. Aortic valve Vmax measures 3.03 m/s.  6. The inferior vena cava is normal in size with greater than 50% respiratory variability, suggesting right atrial pressure of 3 mmHg. FINDINGS  Left Ventricle: Left ventricular ejection fraction, by estimation, is 45 to 50%. The left ventricle has mildly decreased function. The left ventricle demonstrates global hypokinesis. The left ventricular internal cavity size was normal in size. There is  moderate left ventricular hypertrophy. Left ventricular diastolic parameters are consistent with Grade II diastolic dysfunction (pseudonormalization). Right Ventricle: The right ventricular size is normal. No increase in right ventricular wall thickness. Right ventricular systolic function is normal. Tricuspid regurgitation signal is inadequate for assessing PA pressure. Left Atrium: Left atrial size was mildly dilated. Right Atrium: Right atrial size was normal in size. Pericardium: There is no evidence of pericardial effusion. Mitral Valve: The mitral valve is normal in structure.  Mild mitral annular calcification. Mild mitral valve regurgitation. No evidence of mitral valve stenosis. Tricuspid Valve: The tricuspid valve is normal in structure. Tricuspid valve regurgitation is not demonstrated. No evidence of tricuspid stenosis. Aortic Valve: The aortic valve is calcified. Aortic valve regurgitation is not visualized. Moderate aortic stenosis is present. Aortic valve mean gradient measures 19.8 mmHg. Aortic valve peak gradient measures 36.7 mmHg. Aortic valve area, by VTI measures 0.77 cm. Pulmonic Valve: The pulmonic valve was normal in structure. Pulmonic valve regurgitation is not visualized. No evidence of pulmonic stenosis. Aorta: The aortic root is normal in size and structure. Venous: The inferior vena cava is normal in size with greater than 50% respiratory variability, suggesting right atrial pressure of 3 mmHg. IAS/Shunts: No atrial level shunt detected by color flow Doppler.  LEFT VENTRICLE PLAX 2D LVIDd:         5.70 cm     Diastology LVIDs:         4.50 cm     LV e' medial:    3.15 cm/s LV PW:         1.30 cm  LV E/e' medial:  34.3 LV IVS:        1.30 cm     LV e' lateral:   2.39 cm/s LVOT diam:     2.00 cm     LV E/e' lateral: 45.2 LV SV:         44 LV SV Index:   24 LVOT Area:     3.14 cm  LV Volumes (MOD) LV vol d, MOD A4C: 97.6 ml LV vol s, MOD A4C: 48.2 ml LV SV MOD A4C:     97.6 ml RIGHT VENTRICLE RV Basal diam:  2.50 cm RV S prime:     11.60 cm/s TAPSE (M-mode): 1.7 cm LEFT ATRIUM             Index        RIGHT ATRIUM           Index LA diam:        3.50 cm 1.94 cm/m   RA Area:     11.50 cm LA Vol (A2C):   33.0 ml 18.25 ml/m  RA Volume:   26.60 ml  14.71 ml/m LA Vol (A4C):   37.2 ml 20.57 ml/m LA Biplane Vol: 36.9 ml 20.40 ml/m  AORTIC VALVE                     PULMONIC VALVE AV Area (Vmax):    0.76 cm      PV Vmax:       0.92 m/s AV Area (Vmean):   0.70 cm      PV Peak grad:  3.4 mmHg AV Area (VTI):     0.77 cm AV Vmax:           303.00 cm/s AV Vmean:           203.750 cm/s AV VTI:            0.572 m AV Peak Grad:      36.7 mmHg AV Mean Grad:      19.8 mmHg LVOT Vmax:         73.10 cm/s LVOT Vmean:        45.300 cm/s LVOT VTI:          0.140 m LVOT/AV VTI ratio: 0.24  AORTA Ao Root diam: 3.60 cm MITRAL VALVE MV Area (PHT): 3.46 cm     SHUNTS MV Decel Time: 219 msec     Systemic VTI:  0.14 m MV E velocity: 108.00 cm/s  Systemic Diam: 2.00 cm MV A velocity: 74.60 cm/s MV E/A ratio:  1.45 Ida Rogue MD Electronically signed by Ida Rogue MD Signature Date/Time: 04/24/2021/12:49:49 PM    Final     EKG: Initial ECG: ST vs flutter; diffuse ST depressions.   Repeat this AM: NSR, mild diffuse ST depressions.  Echo 02/2021- INTERPRETATION  MILD LV SYSTOLIC DYSFUNCTION (See above)   WITH MILD LVH  MILD RV SYSTOLIC DYSFUNCTION (See above)  MILD VALVULAR REGURGITATION (See above)  MODERATE VALVULAR STENOSIS (See above)  Aortic: MILD - MODERATE AS; AVA 0.74cm^2 AND MAX VELOCITY 3.28ms  AOV: MODERATELY THICKENED, PARTIALLY MOBILE RCC AND LCC; NCC APPEARS IMMOBILE  Mitral: MILD - MODERATE MR  Tricuspid: MILD TR (2.07m)  Pulmonic: NORMAL  LVH: MILD LVH  AVS: MODERATE AS  Mitral: MILD MR  Tricuspid: MILD TR  Closest EF: 50-55% (Estimated)  Contraction: Normal   NM stress 01/2021- SPECT images demonstrate homogeneous tracer  distribution throughout the myocardium. Defect type : Normal  Cath in 09/2018- Occluded RCA with R to R collaterals, occluded SVG to rPDA, 100% mid LAD, 80% D1, 80% mid Lcx. - Patent SVG to OM1/2 (sequential), LIMA patent.   ASSESSMENT AND PLAN:  Thomas Moore is a 65 yo M with h/o CAD s/p CABG (x4 1997 SVG to rPDA occluded, SVG to OM1/2 sequential, LIMA to LAD), moderate AS, T2DM, HTN, seizure DO, PE on Xarelto who presented with interment left arm pain that worsened yesterday. He was discovered to be in a narrow complex tachycarrhythmia (thought to be atrial flutter). He subsequently decompensated requiring intubation.   #  NSTEMI # CAD s/p 4v CABG 1997 # Narrow complex arrhythmia (flutter vs sinus tach) # Moderate AS # Acute hypoxic respiratory failure Known CAD as described above. Presented with left arm pain and narrow complex arrhythmia (A flutter vs sinus tach). Developed CP after admission, and subsequently decompensated requiring intubation; now improved and on BiPAP. EKG this morning with improved ST depression, in sinus rhtyhm at rate of 75 bpm. Troponin increased from normal to 12K thus far. Concerning for NSTEMI. Echo shows EF 45-50% with no clear WMA; at least moderate AS.  - LHC today. - Aspriin 81 mg daily - Continue plavix 75 mg daily x 12 months - Continue heparin infusion until procedure.  - Holding home xarelto - Continue metoprolol  - Continue atorvastatin 40 mg daily - Repeat EKG with changes in respiratory status or hemodynamic stability.   Signed: Andrez Grime MD 04/25/2021, 7:22 AM

## 2021-04-25 NOTE — Consult Note (Signed)
ANTICOAGULATION CONSULT NOTE  Pharmacy Consult for heparin Indication: ACS  No Known Allergies  Patient Measurements: Height: 5\' 3"  (160 cm) Weight: 77.9 kg (171 lb 11.8 oz) IBW/kg (Calculated) : 56.9 Heparin Dosing Weight: 73.1 kg   Vital Signs: Temp: 99.9 F (37.7 C) (11/20 2000) Temp Source: Oral (11/20 2000) BP: 147/74 (11/20 2000) Pulse Rate: 95 (11/20 2000)  Labs: Recent Labs    04/23/21 1906 04/23/21 2239 04/24/21 0010 04/24/21 0651 04/24/21 1130 04/24/21 2301  HGB 11.2*  --   --  9.9*  --   --   HCT 36.1*  --   --  32.2*  --   --   PLT 207  --   --  188  --   --   APTT  --  20*  --   --   --  66*  LABPROT  --  13.6  --   --   --   --   INR  --  1.0  --   --   --   --   HEPARINUNFRC  --  0.33  --   --   --  0.78*  CREATININE 1.09  --   --  1.06  --   --   TROPONINIHS 32* 797* 2,925* 11,994* 10,491*  --      Estimated Creatinine Clearance: 64.2 mL/min (by C-G formula based on SCr of 1.06 mg/dL).   Medical History: Past Medical History:  Diagnosis Date   CAD (coronary artery disease) of artery bypass graft    s/p CABG x 4 in 1997   COPD (chronic obstructive pulmonary disease) (HCC)    Coronary artery disease    CVA (cerebral vascular accident) ([facility])    Diabetes mellitus without complication (HCC)    GERD (gastroesophageal reflux disease)    History of single vessel coronary artery bypass    HLD (hyperlipidemia)    Hypertension    Seizures (H[facility])     Medications:  Xarelto 20 mg daily with supper, last dose documented as "within the past week"  Assessment: 65 y.o. male with a past medical hx of PE on Xarelto presented with complaints of left arm pain as well as chest pain. EKG showed narrow complex rhythm with diffuse ST depression and HR 130. Cardioverting attempted but was unsuccesful and required intubation. Now extubated on BiPAP. Pharmacy has been consulted for heparin dosing.   Troponin continues to increase today (~3,000 > ~12,000). Baseline  Hgb 11.2, plts 207, aPTT,  INR, and heparin level elevated at 0.33. Hemoglobin decreasing this morning, Plts stable. No mention of bleeding in notes.  Started on heparin, then transitioned to enoxaparin x 1 dose (last dose 11/20 0813). Cardiology consulting pharmacy to restart heparin gtt. Per cardiology request, starting heparin 8-12 hours post-enoxaparin dose.  Goal of Therapy:  Heparin level 0.3-0.7 units/ml Monitor platelets by anticoagulation protocol: Yes  1120 2301 aPTT 66, therapeutic, HL 0.78, supra   Plan:  With aPTT barely therapeutic and HL slightly supratherapeutic due to possible carryover from Lovenox, will continue heparin infusion at 900 units/hr at this time Will recheck HL and aPTT w/ AM labs, may monitor with anti-Xa if levels correlate  Continue to monitor H&H and platelets  10-12, PharmD, [facility] 04/25/2021 12:11 AM

## 2021-04-26 ENCOUNTER — Encounter: Payer: Self-pay | Admitting: Internal Medicine

## 2021-04-26 LAB — BASIC METABOLIC PANEL
Anion gap: 10 (ref 5–15)
BUN: 22 mg/dL (ref 8–23)
CO2: 26 mmol/L (ref 22–32)
Calcium: 8.9 mg/dL (ref 8.9–10.3)
Chloride: 101 mmol/L (ref 98–111)
Creatinine, Ser: 1.19 mg/dL (ref 0.61–1.24)
GFR, Estimated: >60 mL/min (ref >60)
Glucose, Bld: 132 mg/dL — ABNORMAL HIGH (ref 70–99)
Potassium: 4.3 mmol/L (ref 3.5–5.1)
Sodium: 137 mmol/L (ref 135–145)

## 2021-04-26 LAB — CBC
HCT: 34.9 % — ABNORMAL LOW (ref 39.0–52.0)
Hemoglobin: 10.9 g/dL — ABNORMAL LOW (ref 13.0–17.0)
MCH: 26.6 pg (ref 26.0–34.0)
MCHC: 31.2 g/dL (ref 30.0–36.0)
MCV: 85.1 fL (ref 80.0–100.0)
Platelets: 207 10*3/uL (ref 150–400)
RBC: 4.1 MIL/uL — ABNORMAL LOW (ref 4.22–5.81)
RDW: 16.4 % — ABNORMAL HIGH (ref 11.5–15.5)
WBC: 6.4 10*3/uL (ref 4.0–10.5)
nRBC: 0 % (ref 0.0–0.2)

## 2021-04-26 LAB — BRAIN NATRIURETIC PEPTIDE: B Natriuretic Peptide: 486.2 pg/mL — ABNORMAL HIGH (ref 0.0–100.0)

## 2021-04-26 LAB — GLUCOSE, CAPILLARY
Glucose-Capillary: 135 mg/dL — ABNORMAL HIGH (ref 70–99)
Glucose-Capillary: 140 mg/dL — ABNORMAL HIGH (ref 70–99)
Glucose-Capillary: 140 mg/dL — ABNORMAL HIGH (ref 70–99)
Glucose-Capillary: 142 mg/dL — ABNORMAL HIGH (ref 70–99)
Glucose-Capillary: 176 mg/dL — ABNORMAL HIGH (ref 70–99)
Glucose-Capillary: 340 mg/dL — ABNORMAL HIGH (ref 70–99)

## 2021-04-26 LAB — MAGNESIUM: Magnesium: 1.9 mg/dL (ref 1.7–2.4)

## 2021-04-26 LAB — PHOSPHORUS: Phosphorus: 5 mg/dL — ABNORMAL HIGH (ref 2.5–4.6)

## 2021-04-26 MED ORDER — RAMIPRIL 2.5 MG PO CAPS
2.5000 mg | ORAL_CAPSULE | Freq: Every day | ORAL | Status: DC
  Administered 2021-04-26 – 2021-04-27 (×2): 2.5 mg via ORAL
  Filled 2021-04-26 (×2): qty 1

## 2021-04-26 MED ORDER — FUROSEMIDE 10 MG/ML IJ SOLN
20.0000 mg | Freq: Every day | INTRAMUSCULAR | Status: DC
  Administered 2021-04-26 – 2021-04-27 (×2): 20 mg via INTRAVENOUS
  Filled 2021-04-26 (×2): qty 2

## 2021-04-26 MED ORDER — METOPROLOL SUCCINATE ER 100 MG PO TB24
100.0000 mg | ORAL_TABLET | Freq: Every day | ORAL | Status: DC
  Administered 2021-04-26 – 2021-04-27 (×2): 100 mg via ORAL
  Filled 2021-04-26: qty 1
  Filled 2021-04-26: qty 2

## 2021-04-26 NOTE — Progress Notes (Signed)
PROGRESS NOTE  Thomas Moore GUR:[id number] DOB: 04/24/1956 DOA: 04/23/2021 PCP: Patient, No Pcp Per (Inactive)  HPI/Recap of past 8 hours: 65 year old male with past medical history of obesity, hypertension, CAD, systolic heart failure who presented to the emergency room on 11/19 complaining of chest pain and left arm pain and found to be in a flutter with diffuse ST depression.  Rate control attempted with Lopressor but patient continued to deteriorate and cardioversion was attempted x2 without effect.  Patient became hypoxic and was intubated and placed on ventilator, and admitted to the critical care service.  By 11/20, able to be extubated to BiPAP.  Seen by cardiology and felt to have had a non-STEMI.  Echocardiogram done noted moderate aortic stenosis and decreased ejection fraction of 40 to 45% compared to 19 months ago.  Currently patient getting diuretics and has diuresed over 3.5 L since admission.  Patient underwent left heart catheterization which noted no signs of occlusive disease as culprit for non-STEMI.  Today patient is doing okay.  He has been weaned off of oxygen although BNP still elevated at almost 500.  He has no complaints.  Assessment/Plan: Principal Problem:   Acute respiratory failure with hypoxia (HCC) secondary to flash pulmonary edema/acute on chronic systolic heart failure from atrial flutter from non-STEMI: Continue diuresis.  Cardiology following.  Left heart catheterization unremarkable for cause of non-STEMI.  Continue diuresis with IV Lasix.  Resume Xarelto at discharge along with Plavix 75 daily x12 months. Active Problems:   Diabetes [facility]): Continue sliding scale.  CBGs under 200.    Essential hypertension: Systolic ranging from 283T to 150s.  Improving as he continues to diurese.    Coronary artery disease involving native coronary artery of native heart without angina pectoris   Hypomagnesemia: Replace electrolytes as needed.   History of seizure    Elevated lipids: continue statin   Hx of pulmonary embolus on chronic anticoagulation: Continue Xarelto on discharge  Acute kidney injury: Monitor closely.  Better today, once we changed decreased strength of IV Lasix    Obesity (BMI 30-39.9): Meets criteria with BMI greater than 30.  Code Status: Full code  Family Communication: Left message for brother  Disposition Plan: Anticipate discharge tomorrow, should be better diuresed   Consultants: Cardiology Pulmonary/critical care  Procedures: Echocardiogram noting moderate aortic stenosis, ejection fraction 40-45% Left heart catheterization final report pending.  No evidence of reversible ischemia  Antimicrobials: None  DVT prophylaxis: Xarelto  Level of care: Med-Surg   Objective: Vitals:   04/26/21 0600 04/26/21 0800  BP: (!) 141/102 123/71  Pulse: 80 65  Resp: 19 (!) 26  Temp:  97.9 F (36.6 C)  SpO2: 94% 95%    Intake/Output Summary (Last 24 hours) at 04/26/2021 0930 Last data filed at 04/26/2021 0600 Gross per 24 hour  Intake 244.69 ml  Output 1625 ml  Net -1380.31 ml    Filed Weights   04/24/21 1221 04/25/21 0500 04/26/21 0349  Weight: 77.9 kg 78.8 kg 74.5 kg   Body mass index is 29.09 kg/m.  Exam:  General: Alert and oriented x2, no acute distress HEENT: Normocephalic and atraumatic, mucous membranes are slightly dry Cardiovascular: Irregular rhythm, rate controlled, 2 out of 6 systolic ejection murmur Respiratory: Clear to auscultation bilaterally Abdomen: Soft, nontender, nondistended, positive bowel sounds Musculoskeletal: No clubbing or cyanosis, trace pitting edema Skin: No skin breaks, tears or lesions Psychiatry: Appropriate, no evidence of psychoses.  Question mild cognitive issues? Neurology: No focal deficits   Data  Reviewed: CBC: Recent Labs  Lab 04/23/21 1906 04/24/21 0651 04/25/21 0418 04/26/21 0514  WBC 8.9 5.5 6.9 6.4  NEUTROABS 7.2  --   --   --   HGB 11.2* 9.9*  11.0* 10.9*  HCT 36.1* 32.2* 34.7* 34.9*  MCV 86.0 84.5 84.6 85.1  PLT 207 188 196 825    Basic Metabolic Panel: Recent Labs  Lab 04/23/21 1906 04/23/21 2239 04/24/21 0651 04/25/21 0418 04/26/21 0514  NA 136  --  140 136 137  K 4.2  --  3.9 4.1 4.3  CL 99  --  104 102 101  CO2 25  --  '27 28 26  ' GLUCOSE 280*  --  130* 192* 132*  BUN 18  --  '15 17 22  ' CREATININE 1.09  --  1.06 1.31* 1.19  CALCIUM 8.8*  --  8.5* 8.7* 8.9  MG 1.4* 1.3* 2.4 1.7 1.9  PHOS  --   --  3.7 4.1 5.0*    GFR: Estimated Creatinine Clearance: 55.9 mL/min (by C-G formula based on SCr of 1.19 mg/dL). Liver Function Tests: Recent Labs  Lab 04/23/21 1906  AST 20  ALT 14  ALKPHOS 75  BILITOT 0.7  PROT 7.6  ALBUMIN 3.5    No results for input(s): LIPASE, AMYLASE in the last 168 hours. No results for input(s): AMMONIA in the last 168 hours. Coagulation Profile: Recent Labs  Lab 04/23/21 2239  INR 1.0    Cardiac Enzymes: No results for input(s): CKTOTAL, CKMB, CKMBINDEX, TROPONINI in the last 168 hours. BNP (last 3 results) No results for input(s): PROBNP in the last 8760 hours. HbA1C: Recent Labs    04/24/21 0651  HGBA1C 6.2*    CBG: Recent Labs  Lab 04/25/21 1624 04/25/21 1955 04/25/21 2318 04/26/21 0339 04/26/21 0724  GLUCAP 177* 193* 148* 142* 140*    Lipid Profile: Recent Labs    04/24/21 0651  TRIG 181*    Thyroid Function Tests: Recent Labs    04/23/21 1906 04/24/21 0651  TSH 5.958*  --   FREET4  --  0.93    Anemia Panel: No results for input(s): VITAMINB12, FOLATE, FERRITIN, TIBC, IRON, RETICCTPCT in the last 72 hours. Urine analysis:    Component Value Date/Time   COLORURINE YELLOW 09/29/2019 0910   APPEARANCEUR Clear 05/12/2020 0940   LABSPEC 1.016 09/29/2019 0910   PHURINE 5.0 09/29/2019 0910   GLUCOSEU 3+ (A) 05/12/2020 0940   HGBUR NEGATIVE 09/29/2019 0910   BILIRUBINUR Negative 05/12/2020 0940   KETONESUR 20 (A) 09/29/2019 0910   PROTEINUR 1+  (A) 05/12/2020 0940   PROTEINUR 100 (A) 09/29/2019 0910   NITRITE Negative 05/12/2020 0940   NITRITE NEGATIVE 09/29/2019 0910   LEUKOCYTESUR Negative 05/12/2020 0940   LEUKOCYTESUR NEGATIVE 09/29/2019 0910   Sepsis Labs: '@LABRCNTIP' (procalcitonin:4,lacticidven:4)  ) Recent Results (from the past 240 hour(s))  Resp Panel by RT-PCR (Flu A&B, Covid) Nasopharyngeal Swab     Status: None   Collection Time: 04/23/21  7:22 PM   Specimen: Nasopharyngeal Swab; Nasopharyngeal(NP) swabs in vial transport medium  Result Value Ref Range Status   SARS Coronavirus 2 by RT PCR NEGATIVE NEGATIVE Final    Comment: (NOTE) SARS-CoV-2 target nucleic acids are NOT DETECTED.  The SARS-CoV-2 RNA is generally detectable in upper respiratory specimens during the acute phase of infection. The lowest concentration of SARS-CoV-2 viral copies this assay can detect is 138 copies/mL. A negative result does not preclude SARS-Cov-2 infection and should not be used as the sole basis  for treatment or other patient management decisions. A negative result may occur with  improper specimen collection/handling, submission of specimen other than nasopharyngeal swab, presence of viral mutation(s) within the areas targeted by this assay, and inadequate number of viral copies(<138 copies/mL). A negative result must be combined with clinical observations, patient history, and epidemiological information. The expected result is Negative.  Fact Sheet for Patients:  EntrepreneurPulse.com.au  Fact Sheet for Healthcare Providers:  IncredibleEmployment.be  This test is no t yet approved or cleared by the [place] FDA and  has been authorized for detection and/or diagnosis of SARS-CoV-2 by FDA under an Emergency Use Authorization (EUA). This EUA will remain  in effect (meaning this test can be used) for the duration of the COVID-19 declaration under Section 564(b)(1) of the Act,  21 U.S.C.section 360bbb-3(b)(1), unless the authorization is terminated  or revoked sooner.       Influenza A by PCR NEGATIVE NEGATIVE Final   Influenza B by PCR NEGATIVE NEGATIVE Final    Comment: (NOTE) The Xpert Xpress SARS-CoV-2/FLU/RSV plus assay is intended as an aid in the diagnosis of influenza from Nasopharyngeal swab specimens and should not be used as a sole basis for treatment. Nasal washings and aspirates are unacceptable for Xpert Xpress SARS-CoV-2/FLU/RSV testing.  Fact Sheet for Patients: EntrepreneurPulse.com.au  Fact Sheet for Healthcare Providers: IncredibleEmployment.be  This test is not yet approved or cleared by the [place] FDA and has been authorized for detection and/or diagnosis of SARS-CoV-2 by FDA under an Emergency Use Authorization (EUA). This EUA will remain in effect (meaning this test can be used) for the duration of the COVID-19 declaration under Section 564(b)(1) of the Act, 21 U.S.C. section 360bbb-3(b)(1), unless the authorization is terminated or revoked.  Performed at [facility], [address], [place], [place] [address]       Studies: No results found.  Scheduled Meds:  aspirin  81 mg Oral Daily   atorvastatin  40 mg Oral QHS   chlorhexidine gluconate (MEDLINE KIT)  15 mL Mouth Rinse BID   Chlorhexidine Gluconate Cloth  6 each Topical Daily   clopidogrel  75 mg Oral Daily   divalproex  250 mg Oral Daily   divalproex  500 mg Oral QHS   docusate  100 mg Per Tube BID   enoxaparin (LOVENOX) injection  40 mg Subcutaneous Q24H   furosemide  20 mg Oral BID   insulin aspart  0-15 Units Subcutaneous Q4H   insulin glargine-yfgn  5 Units Subcutaneous Daily   metoprolol succinate  100 mg Oral Daily   polyethylene glycol  17 g Per Tube Daily   ramipril  2.5 mg Oral Daily   sodium chloride flush  3 mL Intravenous Q12H    Continuous Infusions:  sodium chloride       LOS: 3 days      Annita Brod, MD [facility]   04/26/2021, 9:30 AM

## 2021-04-26 NOTE — Plan of Care (Signed)
  Problem: Education: Goal: Knowledge of General Education information will improve Description Including pain rating scale, medication(s)/side effects and non-pharmacologic comfort measures Outcome: Progressing   Problem: Health Behavior/Discharge Planning: Goal: Ability to manage health-related needs will improve Outcome: Progressing   

## 2021-04-26 NOTE — Consult Note (Signed)
[facility]  CARDIOLOGY CONSULT NOTE  Patient ID: Thomas Moore MRN: [id number] DOB/AGE: [birth year] 65 y.o.  Admit date: 04/23/2021 Referring Physician Otelia Limes Primary Physician Patient, No Pcp Per (Inactive) Primary Cardiologist Barnabas Harries, MD Reason for Consultation SVT, NSTEMI, respiratory failure  HPI:  Thomas Moore is a 65 yo Moore with h/o CAD s/p CABG (x4 1997 SVG to rPDA occluded, SVG to OM1/2 sequential, LIMA to LAD), moderate AS, T2DM, HTN, seizure DO, PE on Xarelto who presented with interment left arm pain that worsened yesterday. He was discovered to be in a narrow complex tachycarrhythmia (thought to be atrial flutter). He subsequently decompensated requiring intubation. Discovered to have NSTEMI. Cardiac cath showed no change in coronary anatomy.   Interval History: - No acute events.  - States the does not want to take lasix. Otherwise does not know his medications.  - No chest or arm pain.   Review of systems complete and found to be negative unless listed above     Past Medical History:  Diagnosis Date   CAD (coronary artery disease) of artery bypass graft    s/p CABG x 4 in 1997   COPD (chronic obstructive pulmonary disease) (HCC)    Coronary artery disease    CVA (cerebral vascular accident) ([place])    Diabetes mellitus without complication ([place])    GERD (gastroesophageal reflux disease)    History of single vessel coronary artery bypass    HLD (hyperlipidemia)    Hypertension    Seizures ([place])     Past Surgical History:  Procedure Laterality Date   BYPASS GRAFT ANGIOGRAPHY N/A 04/25/2021   Procedure: BYPASS GRAFT ANGIOGRAPHY;  Surgeon: Andrez Grime, MD;  Location: [place] [facility];  Service: Cardiovascular;  Laterality: N/A;   CARDIAC SURGERY     CORNEAL TRANSPLANT Right    CORONARY ARTERY BYPASS GRAFT  1997   EYE SURGERY     FLEXOR TENDON REPAIR Left 07/11/2019   Procedure: FLEXOR tenolysis  REPAIR LEFT RING FINGER with tednon repair;   Surgeon: Hessie Knows, MD;  Location: [facility];  Service: Orthopedics;  Laterality: Left;   INCISION AND DRAINAGE ABSCESS Left 05/08/2019   Procedure: INCISION AND DRAINAGE ABSCESS;  Surgeon: Dereck Leep, MD;  Location: [facility];  Service: Orthopedics;  Laterality: Left;   LEFT HEART CATH AND CORS/GRAFTS ANGIOGRAPHY N/A 10/02/2019   Procedure: LEFT HEART CATH AND CORS/GRAFTS ANGIOGRAPHY;  Surgeon: Leonie Man, MD;  Location: [place] [facility];  Service: Cardiovascular;  Laterality: N/A;   LOOP RECORDER INSERTION N/A 10/06/2019   Procedure: LOOP RECORDER INSERTION;  Surgeon: Evans Lance, MD;  Location: [place] [facility];  Service: Cardiovascular;  Laterality: N/A;    Medications Prior to Admission  Medication Sig Dispense Refill Last Dose   atorvastatin (LIPITOR) 40 MG tablet TAKE ONE TABLET BY MOUTH AT BEDTIME 30 tablet 3 Past Week   blood glucose meter kit and supplies KIT Dispense based on patient and insurance preference. Use up to four times daily as directed. (FOR ICD-9 250.00, 250.01). 1 each 0 Past Week   calcium citrate-vitamin D (CITRACAL+D) 315-200 MG-UNIT tablet Take 1 tablet by mouth 2 (two) times daily.   Past Week   Cysteamine Bitartrate (PROCYSBI) 300 MG PACK as directed And strips and lancets for qd check for dm ii   Past Week   divalproex (DEPAKOTE ER) 250 MG 24 hr tablet Take 250 mg by mouth daily.   Past Week   divalproex (DEPAKOTE) 500 MG  DR tablet Take 500 mg by mouth daily as needed.   prn at prn   glipiZIDE (GLUCOTROL XL) 10 MG 24 hr tablet TAKE ONE TABLET BY MOUTH 2 TIMES A DAY 60 tablet 3 Past Week   ipratropium (ATROVENT) 0.06 % nasal spray Place 2 sprays into both nostrils 3 (three) times daily.   Past Week   isosorbide mononitrate (IMDUR) 30 MG 24 hr tablet Take 30 mg by mouth daily.   Past Week   lisinopril (ZESTRIL) 40 MG tablet TAKE ONE TABLET BY MOUTH EVERY DAY 30 tablet 1 Past Week   metFORMIN (GLUCOPHAGE-XR) 500 MG 24 hr tablet TAKE TWO TABLETS BY  MOUTH 2 TIMES A DAY 120 tablet 3 Past Week   metoprolol succinate (TOPROL-XL) 100 MG 24 hr tablet Take 1 tablet (100 mg total) by mouth daily. 30 tablet 3 Past Week   Multiple Vitamin (MULTIVITAMIN) capsule Take 1 capsule by mouth daily.   Past Week   nitroGLYCERIN (NITROSTAT) 0.4 MG SL tablet Place under the tongue.   prn at prn   omeprazole (PRILOSEC) 40 MG capsule TAKE ONE CAPSULE BY MOUTH EVERY DAY 30 capsule 3 Past Week   ramipril (ALTACE) 10 MG capsule Take 10 mg by mouth daily.   Past Week   TRULICITY 0.94 BS/9.6GE SOPN Inject 0.75 mg into the skin once a week. Wednesday 1 mL 4 Past Week   XARELTO 20 MG TABS tablet Take 20 mg by mouth daily with supper.   Past Week   Social History   Socioeconomic History   Marital status: Single    Spouse name: Not on file   Number of children: 0   Years of education: Not on file   Highest education level: High school graduate  Occupational History   Occupation: Unemployed    Comment: Trying to get disability  Tobacco Use   Smoking status: Former    Types: Cigarettes   Smokeless tobacco: Never   Tobacco comments:    Quit 40 years ago  Vaping Use   Vaping Use: Never used  Substance and Sexual Activity   Alcohol use: Not Currently   Drug use: Never   Sexual activity: Never  Other Topics Concern   Not on file  Social History Narrative   ** Merged History Encounter **    Lives in an apartment on [address], has to climb 17 floors   Social Determinants of Health   Financial Resource Strain: Not on file  Food Insecurity: Not on file  Transportation Needs: Not on file  Physical Activity: Not on file  Stress: Not on file  Social Connections: Not on file  Intimate Partner Violence: Not on file    Family History  Problem Relation Age of Onset   Seizures Brother       Review of systems complete and found to be negative unless listed above      PHYSICAL EXAM  General: Well developed, well nourished, in no acute distress. BiPAP  in place. HEENT:  Normocephalic and atramatic Neck:  No JVD.  Lungs: Clear bilaterally to auscultation and percussion. Wearing BiPAP. Heart: HRRR . 3/6 systolic murmur loudest at RUSB, diminished s2.  Abdomen: Bowel sounds are positive, abdomen soft and non-tender  Msk:  Back normal, normal gait. Normal strength and tone for age. Extremities: No clubbing, cyanosis or edema.   Neuro: Alert and oriented X 3. Psych:  Good affect, responds appropriately  Labs:   Lab Results  Component Value Date   WBC 6.4  04/26/2021   HGB 10.9 (L) 04/26/2021   HCT 34.9 (L) 04/26/2021   MCV 85.1 04/26/2021   PLT 207 04/26/2021    Recent Labs  Lab 04/23/21 1906 04/24/21 0651 04/26/21 0514  NA 136   < > 137  K 4.2   < > 4.3  CL 99   < > 101  CO2 25   < > 26  BUN 18   < > 22  CREATININE 1.09   < > 1.19  CALCIUM 8.8*   < > 8.9  PROT 7.6  --   --   BILITOT 0.7  --   --   ALKPHOS 75  --   --   ALT 14  --   --   AST 20  --   --   GLUCOSE 280*   < > 132*   < > = values in this interval not displayed.    Lab Results  Component Value Date   TROPONINI <0.03 08/08/2017     Lab Results  Component Value Date   CHOL 143 02/25/2020   CHOL 131 09/30/2019   CHOL 155 08/09/2017   Lab Results  Component Value Date   HDL 31 (L) 02/25/2020   HDL 28 (L) 09/30/2019   HDL 35 (L) 08/09/2017   Lab Results  Component Value Date   LDLCALC 78 02/25/2020   LDLCALC 79 09/30/2019   LDLCALC 97 08/09/2017   Lab Results  Component Value Date   TRIG 181 (H) 04/24/2021   TRIG 200 (H) 02/25/2020   TRIG 121 09/30/2019   Lab Results  Component Value Date   CHOLHDL 4.6 02/25/2020   CHOLHDL 4.7 09/30/2019   CHOLHDL 4.4 08/09/2017   No results found for: LDLDIRECT    Radiology: DG Abd 1 View  Result Date: 04/23/2021 CLINICAL DATA:  Check gastric catheter placement EXAM: ABDOMEN - 1 VIEW COMPARISON:  None. FINDINGS: Gastric catheter is noted within the stomach. No obstructive changes are seen.  Degenerative changes of lumbar spine are noted. IMPRESSION: Gastric catheter within the stomach. Electronically Signed   By: Inez Catalina Moore.D.   On: 04/23/2021 23:42   CT Angio Chest PE W/Cm &/Or Wo Cm  Result Date: 04/23/2021 CLINICAL DATA:  PE suspected, high prob.  Chest pain, left arm pain EXAM: CT ANGIOGRAPHY CHEST WITH CONTRAST TECHNIQUE: Multidetector CT imaging of the chest was performed using the standard protocol during bolus administration of intravenous contrast. Multiplanar CT image reconstructions and MIPs were obtained to evaluate the vascular anatomy. CONTRAST:  78m OMNIPAQUE IOHEXOL 350 MG/ML SOLN COMPARISON:  09/29/2019 FINDINGS: Cardiovascular: Adequate opacification the pulmonary arterial tree. No intraluminal filling defect identified to suggest acute pulmonary embolism. Central pulmonary arteries are of normal caliber. Coronary artery bypass grafting has been performed. Global cardiac size within normal limits. Moderate degenerative calcification of the aortic valve leaflets. No pericardial effusion. Mild atherosclerotic calcification within the thoracic aorta. No aortic aneurysm. Mediastinum/Nodes: Endotracheal tube seen 3 cm above the carina. Nasogastric tube extends into the proximal body of the stomach. Several pathologically enlarged right hilar and subcarinal lymph nodes are identified. Index lymph node measures 16 mm within the a right hilar lymph node group and 18 mm within the subcarinal lymph node group at axial image # 49/4. Visualized thyroid is unremarkable. Lungs/Pleura: Multifocal perihilar and lower lung zone interstitial and ground-glass pulmonary infiltrates are identified, most in keeping with atypical infection in the appropriate clinical setting. Small bilateral pleural effusions are present. No pneumothorax. No central obstructing  lesion. Upper Abdomen: No acute abnormality. Musculoskeletal: No acute bone abnormality. Osseous structures are age-appropriate. No lytic  or blastic bone lesions are identified. Review of the MIP images confirms the above findings. IMPRESSION: No pulmonary embolism. Bilateral pulmonary infiltrates most in keeping with atypical infection in the appropriate clinical setting. Small bilateral pleural effusions. Status post coronary artery bypass grafting. Moderate degenerative calcification of the aortic valve leaflets. Echocardiography may be more helpful to assess the degree of valvular dysfunction. Pathologic right hilar and mediastinal adenopathy, possibly reactive in nature. Aortic Atherosclerosis (ICD10-I70.0). Electronically Signed   By: Fidela Salisbury Moore.D.   On: 04/23/2021 22:22   CARDIAC CATHETERIZATION  Result Date: 04/25/2021   Prox LAD lesion is 55% stenosed.   Mid LAD lesion is 100% stenosed.   Prox RCA lesion is 90% stenosed.   Prox RCA to Mid RCA lesion is 100% stenosed.   Origin to Prox Graft lesion is 100% stenosed.   Ost Cx to Mid Cx lesion is 100% stenosed.   Mid Cx to Dist Cx lesion is 80% stenosed with 100% stenosed side branch in 2nd Mrg.   2nd Diag-1 lesion is 80% stenosed.   2nd Diag-2 lesion is 55% stenosed.   1st Sept lesion is 70% stenosed.   RPDA lesion is 80% stenosed.   SVG.   Seq SVG- 1stMrg-2ndMrg and is large.   LIMA and is normal in caliber.   The graft exhibits severe .   The flow is not reversed.   There is no competitive flow Conclusion: Unchanged coronary anatomy compared to 2019. No culprit lesion for NSTEMI. Severe 3 vessel CAD including CTO p RCA (not engaged), CTO prox Lcx, CTO mid LAD. There is diffuse disease leading to D1 which is unchanged. SVG to RCA is occluded (known). SVG to OM1/2 is widely patent. LIMA to LAD is widely patent Conclusion: Medical management of CAD. Plan for Plavix and Xarelto x 12 months at discharge at which time plavix can be discontinued. Tortuosity of the subclavian and radial artery made catheter manipulation difficult. Consider alternative access for future studies. May need  further evaluation of aortic stenosis and atrial arrhythmia that may have driven his presentation. Follow up with me in clinic in 1 week after discharge.   DG Chest Port 1 View  Result Date: 04/24/2021 CLINICAL DATA:  Intubation EXAM: PORTABLE CHEST 1 VIEW COMPARISON:  Portable chest 04/23/2021 at 8:26 p.Moore. FINDINGS: Enteric tube has been advanced. The proximal side-hole is no longer at the hiatal level but is also not in the field of view either. Endotracheal tube is in place with tip 5 cm from the carina and there are overlying monitor wires and translucent overlying electrical pad on the left. Left chest loop recorder device and CABG change. Cardiomegaly. There is perihilar vascular congestion, mild interstitial edema in the lower zones with improvement and regression of interstitial edema from the mid field areas. Small pleural effusions appear similar. There is persistent haziness in the lower lung fields which could be atelectasis, pneumonitis or ground-glass edema. No current focal infiltrate in the mid and upper zones. IMPRESSION: 1. enteric tube has been advanced further into the stomach but the side hole and tip are not included in the field. 1. Other support devices as above with overlying monitor wires and electrical pads. 2. Improved interstitial edema, persistent small pleural effusions and persistent basilar hazy opacities. Electronically Signed   By: Telford Nab Moore.D.   On: 04/24/2021 04:42   DG Chest Portable 1 View  Result Date: 04/23/2021 CLINICAL DATA:  Check endotracheal tube placement EXAM: PORTABLE CHEST 1 VIEW COMPARISON:  Film from earlier in the same day. FINDINGS: Cardiac shadow is enlarged but stable. Postsurgical changes are again seen and stable. Endotracheal tube is noted 3 spot cm above the carina. Gastric catheter extends into the stomach although the proximal side port lies within the distal esophagus. This should be advanced several cm deeper into the stomach. Diffuse  interstitial edema is noted worsened in the interval from the prior exam. No focal confluent infiltrate is seen. No bony abnormality is noted. IMPRESSION: Tubes and lines as described above. The gastric catheter should be advanced deeper into the stomach. Increasing edema. Electronically Signed   By: Inez Catalina Moore.D.   On: 04/23/2021 20:54   DG Chest Portable 1 View  Result Date: 04/23/2021 CLINICAL DATA:  Left arm and chest pain EXAM: PORTABLE CHEST 1 VIEW COMPARISON:  09/29/2019 FINDINGS: Single frontal view of the chest demonstrates persistent enlargement of the cardiac silhouette. Postsurgical changes from prior CABG. Loop recorder overlies the left mid chest. There is central vascular congestion and interstitial prominence consistent with interstitial edema and mild volume overload. No focal airspace disease, effusion, or pneumothorax. No acute bony abnormalities. IMPRESSION: 1. Interstitial edema.  No acute airspace disease. Electronically Signed   By: Randa Ngo Moore.D.   On: 04/23/2021 19:48   ECHOCARDIOGRAM COMPLETE  Result Date: 04/24/2021    ECHOCARDIOGRAM REPORT   Patient Name:   Thomas Moore Date of Exam: 04/24/2021 Medical Rec #:  [id number]      Height:       63.0 in Accession #:    [id number]     Weight:       170.9 lb Date of Birth:  01/02/1956       BSA:          1.808 Moore Patient Age:    27 years       BP:           111/67 mmHg Patient Gender: Moore              HR:           92 bpm. Exam Location:  [facility] Procedure: 2D Echo Indications:     Cardiomyopathy I42.9  History:         Patient has prior history of Echocardiogram examinations, most                  recent 09/30/2019.  Sonographer:     Kathlen Brunswick RDCS Referring Phys:  [id number] BRITTON L RUST-CHESTER Diagnosing Phys: Ida Rogue MD IMPRESSIONS  1. Left ventricular ejection fraction, by estimation, is 45 to 50%. The left ventricle has mildly decreased function. The left ventricle demonstrates global hypokinesis. There is moderate  left ventricular hypertrophy. Left ventricular diastolic parameters are consistent with Grade II diastolic dysfunction (pseudonormalization).  2. Right ventricular systolic function is normal. The right ventricular size is normal. Tricuspid regurgitation signal is inadequate for assessing PA pressure.  3. Left atrial size was mildly dilated.  4. The mitral valve is normal in structure. Mild mitral valve regurgitation. No evidence of mitral stenosis.  5. The aortic valve is calcified. Aortic valve regurgitation is not visualized. Moderate aortic valve stenosis. Aortic valve area, by VTI measures 0.77 cm. Aortic valve mean gradient measures 19.8 mmHg. Aortic valve Vmax measures 3.03 Moore/s.  6. The inferior vena cava is normal in size with greater than 50% respiratory variability, suggesting right atrial  pressure of 3 mmHg. FINDINGS  Left Ventricle: Left ventricular ejection fraction, by estimation, is 45 to 50%. The left ventricle has mildly decreased function. The left ventricle demonstrates global hypokinesis. The left ventricular internal cavity size was normal in size. There is  moderate left ventricular hypertrophy. Left ventricular diastolic parameters are consistent with Grade II diastolic dysfunction (pseudonormalization). Right Ventricle: The right ventricular size is normal. No increase in right ventricular wall thickness. Right ventricular systolic function is normal. Tricuspid regurgitation signal is inadequate for assessing PA pressure. Left Atrium: Left atrial size was mildly dilated. Right Atrium: Right atrial size was normal in size. Pericardium: There is no evidence of pericardial effusion. Mitral Valve: The mitral valve is normal in structure. Mild mitral annular calcification. Mild mitral valve regurgitation. No evidence of mitral valve stenosis. Tricuspid Valve: The tricuspid valve is normal in structure. Tricuspid valve regurgitation is not demonstrated. No evidence of tricuspid stenosis. Aortic  Valve: The aortic valve is calcified. Aortic valve regurgitation is not visualized. Moderate aortic stenosis is present. Aortic valve mean gradient measures 19.8 mmHg. Aortic valve peak gradient measures 36.7 mmHg. Aortic valve area, by VTI measures 0.77 cm. Pulmonic Valve: The pulmonic valve was normal in structure. Pulmonic valve regurgitation is not visualized. No evidence of pulmonic stenosis. Aorta: The aortic root is normal in size and structure. Venous: The inferior vena cava is normal in size with greater than 50% respiratory variability, suggesting right atrial pressure of 3 mmHg. IAS/Shunts: No atrial level shunt detected by color flow Doppler.  LEFT VENTRICLE PLAX 2D LVIDd:         5.70 cm     Diastology LVIDs:         4.50 cm     LV e' medial:    3.15 cm/s LV PW:         1.30 cm     LV E/e' medial:  34.3 LV IVS:        1.30 cm     LV e' lateral:   2.39 cm/s LVOT diam:     2.00 cm     LV E/e' lateral: 45.2 LV SV:         44 LV SV Index:   24 LVOT Area:     3.14 cm  LV Volumes (MOD) LV vol d, MOD A4C: 97.6 ml LV vol s, MOD A4C: 48.2 ml LV SV MOD A4C:     97.6 ml RIGHT VENTRICLE RV Basal diam:  2.50 cm RV S prime:     11.60 cm/s TAPSE (Moore-mode): 1.7 cm LEFT ATRIUM             Index        RIGHT ATRIUM           Index LA diam:        3.50 cm 1.94 cm/Moore   RA Area:     11.50 cm LA Vol (A2C):   33.0 ml 18.25 ml/Moore  RA Volume:   26.60 ml  14.71 ml/Moore LA Vol (A4C):   37.2 ml 20.57 ml/Moore LA Biplane Vol: 36.9 ml 20.40 ml/Moore  AORTIC VALVE                     PULMONIC VALVE AV Area (Vmax):    0.76 cm      PV Vmax:       0.92 Moore/s AV Area (Vmean):   0.70 cm      PV Peak grad:  3.4 mmHg AV Area (VTI):  0.77 cm AV Vmax:           303.00 cm/s AV Vmean:          203.750 cm/s AV VTI:            0.572 Moore AV Peak Grad:      36.7 mmHg AV Mean Grad:      19.8 mmHg LVOT Vmax:         73.10 cm/s LVOT Vmean:        45.300 cm/s LVOT VTI:          0.140 Moore LVOT/AV VTI ratio: 0.24  AORTA Ao Root diam: 3.60 cm MITRAL VALVE MV  Area (PHT): 3.46 cm     SHUNTS MV Decel Time: 219 msec     Systemic VTI:  0.14 Moore MV E velocity: 108.00 cm/s  Systemic Diam: 2.00 cm MV A velocity: 74.60 cm/s MV E/A ratio:  1.45 Ida Rogue MD Electronically signed by Ida Rogue MD Signature Date/Time: 04/24/2021/12:49:49 PM    Final    CUP PACEART REMOTE DEVICE CHECK  Result Date: 04/25/2021 ILR summary report received. Battery status OK. Normal device function. No new symptom, tachy, brady, or pause episodes. No new AF episodes. Monthly summary reports and ROV/PRN LH   EKG: Initial ECG: ST vs flutter; diffuse ST depressions.   Repeat this AM: NSR, mild diffuse ST depressions.  Echo 02/2021- INTERPRETATION  MILD LV SYSTOLIC DYSFUNCTION (See above)   WITH MILD LVH  MILD RV SYSTOLIC DYSFUNCTION (See above)  MILD VALVULAR REGURGITATION (See above)  MODERATE VALVULAR STENOSIS (See above)  Aortic: MILD - MODERATE AS; AVA 0.74cm^2 AND MAX VELOCITY 3.66ms  AOV: MODERATELY THICKENED, PARTIALLY MOBILE RCC AND LCC; NCC APPEARS IMMOBILE  Mitral: MILD - MODERATE MR  Tricuspid: MILD TR (2.028m)  Pulmonic: NORMAL  LVH: MILD LVH  AVS: MODERATE AS  Mitral: MILD MR  Tricuspid: MILD TR  Closest EF: 50-55% (Estimated)  Contraction: Normal   NM stress 01/2021- SPECT images demonstrate homogeneous tracer  distribution throughout the myocardium. Defect type : Normal    Cath in 09/2018- Occluded RCA with R to R collaterals, occluded SVG to rPDA, 100% mid LAD, 80% D1, 80% mid Lcx. - Patent SVG to OM1/2 (sequential), LIMA patent.   ASSESSMENT AND PLAN:  Thomas Moore a [age]o Moore with h/o CAD s/p CABG (x4 1997 SVG to rPDA occluded, SVG to OM1/2 sequential, LIMA to LAD), moderate AS, T2DM, HTN, seizure DO, PE on Xarelto who presented with interment left arm pain that worsened yesterday. He was discovered to be in a narrow complex tachycarrhythmia (thought to be atrial flutter). He subsequently decompensated requiring intubation.   # NSTEMI # CAD s/p  4v CABG 1997 # Narrow complex arrhythmia (flutter vs sinus tach) # Moderate AS # Acute hypoxic respiratory failure Known CAD as described above. Presented with left arm pain and narrow complex arrhythmia (A flutter vs sinus tach). Developed CP after admission, and subsequently decompensated requiring intubation; now improved and on BiPAP. EKG this morning with improved ST depression, in sinus rhtyhm at rate of 75 bpm. Troponin increased from normal to 12K thus far. Concerning for NSTEMI. Echo shows EF 45-50% with no clear WMA; at least moderate AS. LHC showed no changed in coronary anatomy; presentation likely related to tachyarrhythmia and AS. - Aspriin 81 mg until discharge, then STOP. - Continue plavix 75 mg daily x 12 months - Resume Xarelto at discharge (regimen will be xarelto and plavix) - Continue metoprolol XL 100  mg daily - Continue atorvastatin 40 mg daily - Resume home ramipril today- will reduce dose to 2.5 mg since patient does not know medications.  - Plan to DC on lasix 20 mg PO daily.  - Patient needs to get out of bed and ambulate prior to discharge.  - Will need follow up with me in 1-2 weeks.  Signed: Andrez Grime MD 04/26/2021, 7:59 AM

## 2021-04-26 NOTE — Consult Note (Signed)
PHARMACY CONSULT NOTE - FOLLOW UP  Pharmacy Consult for Electrolyte Monitoring and Replacement   Recent Labs: Potassium (mmol/L)  Date Value  04/26/2021 4.3   Magnesium (mg/dL)  Date Value  16/12/3708 1.9   Calcium (mg/dL)  Date Value  62/69/4854 8.9   Albumin (g/dL)  Date Value  62/70/3500 3.5  02/25/2020 4.1   Phosphorus (mg/dL)  Date Value  93/81/8299 5.0 (H)   Sodium (mmol/L)  Date Value  04/26/2021 137  02/25/2020 138     Assessment: 65yo male w/ h/o PE (on Xarelto), CVA, CAD (CABG x4; '97) p/w c/o left arm pain as well as chest pain . EKG showed narrow complex rhythm with diffuse ST depression and HR 130. Cardioverting attempted but was unsuccesful and required intubation. Now extubated on BiPAP  Goal of Therapy:  Lytes WNL  Plan:  Scr:  1.31>1.19, but Lytes remain WNL. No repletion req'd at this time Will CTM and adjust PRN. Labs ordered.  Martyn Malay, PharmD, [facility] Clinical Pharmacist 04/26/2021 11:30 AM

## 2021-04-26 NOTE — Evaluation (Signed)
Physical Therapy Evaluation Patient Details Name: Thomas Moore MRN: [id number] DOB: 1956/01/16 Today's Date: 04/26/2021  History of Present Illness  Pt is a 65 year old male with past medical history of obesity, hypertension, CAD, systolic heart failure who presented to the emergency room on 11/19 complaining of chest pain and left arm pain and found to be in a flutter with diffuse ST depression. medical management attempted as well as cardioversion  x2 without effect.  Patient became hypoxic and was intubated, extubated 11/20.  Workup deemed a non-STEMI per cardiology.  hx of CAD status post CABG x4 in 1997, hypertension, prior CVA, prior PE on coumadin, seizure-like disorder on Depakote, prior syncope, and diabetes mellitus.   Clinical Impression  Pt A&Ox4, denied pain. Stated at baseline he is independent for IADLs, doesn't drive so needs a ride to the grocery store. Lives on the first floor in the apartment complex now, with neighbors/friends to assist PRN.   The patient was seated in recliner at start of session. MMT revealed mild deficits in RLE, but functional, as well as L hand (hx of impairments). He was able to perform sit <> stand with supervision and ambulated with no AD. Occasional lateral sway noted with narrow base of support, but no LOB and no assistance needed to correct. Pt stated he normally wears shoes. Pt on room air throughout, spO2 >90% HR in 100s. The patient demonstrated and reported return to baseline level of functioning, no further acute PT needs indicated. PT to sign off. Please reconsult PT if pt status changes or acute needs are identified.       Recommendations for follow up therapy are one component of a multi-disciplinary discharge planning process, led by the attending physician.  Recommendations may be updated based on patient status, additional functional criteria and insurance authorization.  Follow Up Recommendations No PT follow up    Assistance  Recommended at Discharge None  Functional Status Assessment Patient has not had a recent decline in their functional status  Equipment Recommendations  None recommended by PT    Recommendations for Other Services       Precautions / Restrictions Precautions Precautions: Fall Restrictions Weight Bearing Restrictions: No      Mobility  Bed Mobility               General bed mobility comments: pt up in recliner at start of session    Transfers Overall transfer level: Needs assistance Equipment used: None Transfers: Sit to/from Stand Sit to Stand: Supervision                Ambulation/Gait Ambulation/Gait assistance: Supervision;Min guard Gait Distance (Feet): 80 Feet           General Gait Details: occasional lateral sway and gait path deviations. Pt reported that he normally wears shoes  Stairs            Wheelchair Mobility    Modified Rankin (Stroke Patients Only)       Balance Overall balance assessment: Needs assistance Sitting-balance support: Feet supported Sitting balance-Leahy Scale: Good       Standing balance-Leahy Scale: Good                               Pertinent Vitals/Pain Pain Assessment: No/denies pain    Home Living Family/patient expects to be discharged to:: Private residence Living Arrangements: Alone Available Help at Discharge: Friend(s);Available PRN/intermittently Type of Home: Apartment Home Access: Level entry  Home Layout: One level Home Equipment: None Additional Comments: Pt reported neighbors and friends can check in    Prior Function Prior Level of Function : Independent/Modified Independent             Mobility Comments: needs assist with IADLs, does not drive so rides with a neighbor to go get groceries. denies falls       Hand Dominance   Dominant Hand: Right    Extremity/Trunk Assessment   Upper Extremity Assessment Upper Extremity Assessment: RUE  deficits/detail;LUE deficits/detail RUE Deficits / Details: grossly 4/5 LUE Deficits / Details: grossly 4/5, decreased grip strength and function, hx of impairments    Lower Extremity Assessment Lower Extremity Assessment: RLE deficits/detail;LLE deficits/detail RLE Deficits / Details: grossly 4/5, hip flexion 4-/5 LLE Deficits / Details: grossly 4/5       Communication   Communication: HOH  Cognition Arousal/Alertness: Awake/alert Behavior During Therapy: WFL for tasks assessed/performed Overall Cognitive Status: Within Functional Limits for tasks assessed                                          General Comments      Exercises     Assessment/Plan    PT Assessment Patient does not need any further PT services  PT Problem List         PT Treatment Interventions      PT Goals (Current goals can be found in the Care Plan section)       Frequency     Barriers to discharge        Co-evaluation               AM-PAC PT "6 Clicks" Mobility  Outcome Measure Help needed turning from your back to your side while in a flat bed without using bedrails?: None Help needed moving from lying on your back to sitting on the side of a flat bed without using bedrails?: None Help needed moving to and from a bed to a chair (including a wheelchair)?: None Help needed standing up from a chair using your arms (e.g., wheelchair or bedside chair)?: None Help needed to walk in hospital room?: None Help needed climbing 3-5 steps with a railing? : A Little 6 Click Score: 23    End of Session Equipment Utilized During Treatment: Gait belt Activity Tolerance: Patient tolerated treatment well Patient left: in chair;with call bell/phone within reach Nurse Communication: Mobility status PT Visit Diagnosis: Other abnormalities of gait and mobility (R26.89)    Time: 0867-6195 PT Time Calculation (min) (ACUTE ONLY): 24 min   Charges:   PT Evaluation $PT Eval Low  Complexity: 1 Low PT Treatments $Therapeutic Activity: 8-22 mins       Olga Coaster PT, DPT 10:27 AM,04/26/21

## 2021-04-27 ENCOUNTER — Encounter: Payer: Self-pay | Admitting: Internal Medicine

## 2021-04-27 DIAGNOSIS — I214 Non-ST elevation (NSTEMI) myocardial infarction: Principal | ICD-10-CM

## 2021-04-27 LAB — GLUCOSE, CAPILLARY
Glucose-Capillary: 157 mg/dL — ABNORMAL HIGH (ref 70–99)
Glucose-Capillary: 164 mg/dL — ABNORMAL HIGH (ref 70–99)
Glucose-Capillary: 371 mg/dL — ABNORMAL HIGH (ref 70–99)

## 2021-04-27 LAB — CBC
HCT: 36.5 % — ABNORMAL LOW (ref 39.0–52.0)
Hemoglobin: 11.5 g/dL — ABNORMAL LOW (ref 13.0–17.0)
MCH: 26.4 pg (ref 26.0–34.0)
MCHC: 31.5 g/dL (ref 30.0–36.0)
MCV: 83.9 fL (ref 80.0–100.0)
Platelets: 227 10*3/uL (ref 150–400)
RBC: 4.35 MIL/uL (ref 4.22–5.81)
RDW: 16.4 % — ABNORMAL HIGH (ref 11.5–15.5)
WBC: 6.6 10*3/uL (ref 4.0–10.5)
nRBC: 0 % (ref 0.0–0.2)

## 2021-04-27 LAB — BASIC METABOLIC PANEL
Anion gap: 12 (ref 5–15)
BUN: 35 mg/dL — ABNORMAL HIGH (ref 8–23)
CO2: 26 mmol/L (ref 22–32)
Calcium: 9.1 mg/dL (ref 8.9–10.3)
Chloride: 99 mmol/L (ref 98–111)
Creatinine, Ser: 1.33 mg/dL — ABNORMAL HIGH (ref 0.61–1.24)
GFR, Estimated: 59 mL/min — ABNORMAL LOW (ref >60)
Glucose, Bld: 165 mg/dL — ABNORMAL HIGH (ref 70–99)
Potassium: 4.1 mmol/L (ref 3.5–5.1)
Sodium: 137 mmol/L (ref 135–145)

## 2021-04-27 LAB — MAGNESIUM: Magnesium: 1.8 mg/dL (ref 1.7–2.4)

## 2021-04-27 LAB — PHOSPHORUS: Phosphorus: 5.8 mg/dL — ABNORMAL HIGH (ref 2.5–4.6)

## 2021-04-27 MED ORDER — CLOPIDOGREL BISULFATE 75 MG PO TABS: 75.0000 mg | ORAL_TABLET | Freq: Every day | ORAL | 0 refills | Status: DC

## 2021-04-27 MED ORDER — RAMIPRIL 2.5 MG PO CAPS: 2.5000 mg | ORAL_CAPSULE | Freq: Every day | ORAL | 0 refills | Status: DC

## 2021-04-27 MED ORDER — DIVALPROEX SODIUM 500 MG PO DR TAB
500.0000 mg | DELAYED_RELEASE_TABLET | Freq: Every day | ORAL | Status: DC
Start: 2021-04-27 — End: 2021-09-02

## 2021-04-27 MED ORDER — FUROSEMIDE 20 MG PO TABS: 20.0000 mg | ORAL_TABLET | Freq: Every day | ORAL | 0 refills | Status: DC

## 2021-04-27 NOTE — Progress Notes (Incomplete)
[facility] Cardiology    SUBJECTIVE: ***   Vitals:   04/27/21 0347 04/27/21 0727 04/27/21 0900 04/27/21 1120  BP: 126/84 132/80  130/66  Pulse: 87 88  90  Resp: 16 17  18   Temp: 98.2 F (36.8 C) 97.8 F (36.6 C)  98 F (36.7 C)  TempSrc: Oral Oral  Oral  SpO2: 94% 95%  95%  Weight: 13.3 kg  72.5 kg   Height:         Intake/Output Summary (Last 24 hours) at 04/27/2021 1323 Last data filed at 04/27/2021 0900 Gross per 24 hour  Intake 200 ml  Output 1125 ml  Net -925 ml      PHYSICAL EXAM  General: Well developed, well nourished, in no acute distress HEENT:  Normocephalic and atramatic Neck:  No JVD.  Lungs: Clear bilaterally to auscultation and percussion. Heart: HRRR . Normal S1 and S2 without gallops or murmurs.  Abdomen: Bowel sounds are positive, abdomen soft and non-tender  Msk:  Back normal, normal gait. Normal strength and tone for age. Extremities: No clubbing, cyanosis or edema.   Neuro: Alert and oriented X 3. Psych:  Good affect, responds appropriately   LABS: Basic Metabolic Panel: Recent Labs    04/26/21 0514 04/27/21 0402  NA 137 137  K 4.3 4.1  CL 101 99  CO2 26 26  GLUCOSE 132* 165*  BUN 22 35*  CREATININE 1.19 1.33*  CALCIUM 8.9 9.1  MG 1.9 1.8  PHOS 5.0* 5.8*   Liver Function Tests: No results for input(s): AST, ALT, ALKPHOS, BILITOT, PROT, ALBUMIN in the last 72 hours. No results for input(s): LIPASE, AMYLASE in the last 72 hours. CBC: Recent Labs    04/26/21 0514 04/27/21 0402  WBC 6.4 6.6  HGB 10.9* 11.5*  HCT 34.9* 36.5*  MCV 85.1 83.9  PLT 207 227   Cardiac Enzymes: No results for input(s): CKTOTAL, CKMB, CKMBINDEX, TROPONINI in the last 72 hours. BNP: Invalid input(s): POCBNP D-Dimer: No results for input(s): DDIMER in the last 72 hours. Hemoglobin A1C: No results for input(s): HGBA1C in the last 72 hours. Fasting Lipid Panel: No results for input(s): CHOL, HDL, LDLCALC, TRIG, CHOLHDL, LDLDIRECT in the last 72  hours. Thyroid Function Tests: No results for input(s): TSH, T4TOTAL, T3FREE, THYROIDAB in the last 72 hours.  Invalid input(s): FREET3 Anemia Panel: No results for input(s): VITAMINB12, FOLATE, FERRITIN, TIBC, IRON, RETICCTPCT in the last 72 hours.  No results found.   Echo ***  TELEMETRY: ***:  ASSESSMENT AND PLAN:  Principal Problem:   Acute respiratory failure with hypoxia (HCC) Active Problems:   Diabetes (HCC)   Essential hypertension   Coronary artery disease involving native coronary artery of native heart without angina pectoris   Hypomagnesemia   History of seizure   Elevated lipids   Hx of pulmonary embolus   Acute on chronic HFrEF (heart failure with reduced ejection fraction) (HCC)   Endotracheally intubated   Pneumonia   On mechanically assisted ventilation (HCC)   Obesity (BMI 30-39.9)    1. ***   04/29/21, MD 04/27/2021 1:23 PM

## 2021-04-27 NOTE — Consult Note (Signed)
PHARMACY CONSULT NOTE - FOLLOW UP  Pharmacy Consult for Electrolyte Monitoring and Replacement   Recent Labs: Potassium (mmol/L)  Date Value  04/27/2021 4.1   Magnesium (mg/dL)  Date Value  20/23/3435 1.8   Calcium (mg/dL)  Date Value  68/61/6837 9.1   Albumin (g/dL)  Date Value  29/07/1113 3.5  02/25/2020 4.1   Phosphorus (mg/dL)  Date Value  52/01/222 5.8 (H)   Sodium (mmol/L)  Date Value  04/27/2021 137  02/25/2020 138     Assessment: 65yo male w/ h/o PE (on Xarelto), CVA, CAD (CABG x4; '97) p/w c/o left arm pain as well as chest pain . EKG showed narrow complex rhythm with diffuse ST depression and HR 130. Cardioverting attempted but was unsuccesful and required intubation. Now extubated on BiPAP  Goal of Therapy:  Electrolytes WNL  Plan:  Electrolytes remain WNL. No repletion required x 3 days. Initial consult for pt in ICU/CCM monitoring. Pharmacy will sign off at this time.   Thomas Moore, PharmD Clinical Pharmacist  04/27/2021 7:14 AM

## 2021-04-27 NOTE — Discharge Summary (Addendum)
Physician Discharge Summary  Thomas Moore QIW:[id number] DOB: 1955/11/23 DOA: 04/23/2021  PCP: Patient, No Pcp Per (Inactive)  Admit date: 04/23/2021 Discharge date: 04/27/2021  Discharge disposition: Home   Recommendations for Outpatient Follow-Up:   Follow up with Dr. Princess Perna, cardiologist, in 1 to 2 weeks (office will call to schedule appointment)   Discharge Diagnosis:   Principal Problem:   Non-ST elevation (NSTEMI) myocardial infarction [facility]) Active Problems:   Diabetes ([place])   Essential hypertension   Coronary artery disease involving native coronary artery of native heart without angina pectoris   Hypomagnesemia   History of seizure   Elevated lipids   Hx of pulmonary embolus   Acute on chronic HFrEF (heart failure with reduced ejection fraction) ([place])   Acute respiratory failure with hypoxia ([place])   Endotracheally intubated   Pneumonia   On mechanically assisted ventilation (HCC)   Obesity (BMI 30-39.9)    Discharge Condition: Stable.  Diet recommendation:  Diet Order             Diet - low sodium heart healthy           Diet Carb Modified                     Code Status: Prior     Hospital Course:   Thomas Moore is a 65 year old man with medical history significant for obesity, hypertension, CAD, chronic systolic CHF, who presented to the hospital because of chest pain and left arm pain.  He was found to have atrial flutter with RVR and acute NSTEMI.   Heart rate did not improve with rate control medications.  Subsequently, he underwent cardioversion (x2) without improvement in heart rate or conversion to normal sinus rhythm.  He developed acute on chronic systolic CHF and acute hypoxemic respiratory failure requiring intubation and mechanical ventilation.  He was treated with IV Lasix.  He was extubated to BiPAP and transferred to [facility] hospitalist service.  He underwent left heart cath but there was no occlusive disease to account for  NSTEMI.  He has been weaned off of oxygen.  His creatinine was slightly elevated but did not meet criteria for AKI.  His condition has improved and he is deemed stable for discharge to home today.    Medical Consultants:   Cardiologist   Discharge Exam:    Vitals:   04/27/21 0347 04/27/21 0727 04/27/21 0900 04/27/21 1120  BP: 126/84 132/80  130/66  Pulse: 87 88  90  Resp: _0 Temp: 98.2 F (36.8 C) 97.8 F (36.6 C)  98 F (36.7 C)  TempSrc: Oral Oral  Oral  SpO2: 94% 95%  95%  Weight: 13.3 kg  72.5 kg   Height:         GEN: NAD SKIN: Warm and dry EYES: No pallor or icterus ENT: MMM CV: RRR PULM: CTA B ABD: soft, ND, NT, +BS CNS: AAO x 3, non focal EXT: No edema or tenderness   The results of significant diagnostics from this hospitalization (including imaging, microbiology, ancillary and laboratory) are listed below for reference.     Procedures and Diagnostic Studies:   CARDIAC CATHETERIZATION  Result Date: 04/25/2021   Prox LAD lesion is 55% stenosed.   Mid LAD lesion is 100% stenosed.   Prox RCA lesion is 90% stenosed.   Prox RCA to Mid RCA lesion is 100% stenosed.   Origin to Prox Graft lesion is 100% stenosed.   Ost Cx  to Mid Cx lesion is 100% stenosed.   Mid Cx to Dist Cx lesion is 80% stenosed with 100% stenosed side branch in 2nd Mrg.   2nd Diag-1 lesion is 80% stenosed.   2nd Diag-2 lesion is 55% stenosed.   1st Sept lesion is 70% stenosed.   RPDA lesion is 80% stenosed.   SVG.   Seq SVG- 1stMrg-2ndMrg and is large.   LIMA and is normal in caliber.   The graft exhibits severe .   The flow is not reversed.   There is no competitive flow Conclusion: Unchanged coronary anatomy compared to 2019. No culprit lesion for NSTEMI. Severe 3 vessel CAD including CTO p RCA (not engaged), CTO prox Lcx, CTO mid LAD. There is diffuse disease leading to D1 which is unchanged. SVG to RCA is occluded (known). SVG to OM1/2 is widely patent. LIMA to LAD is widely patent  Conclusion: Medical management of CAD. Plan for Plavix and Xarelto x 12 months at discharge at which time plavix can be discontinued. Tortuosity of the subclavian and radial artery made catheter manipulation difficult. Consider alternative access for future studies. May need further evaluation of aortic stenosis and atrial arrhythmia that may have driven his presentation. Follow up with me in clinic in 1 week after discharge.   DG Chest Port 1 View  Result Date: 04/24/2021 CLINICAL DATA:  Intubation EXAM: PORTABLE CHEST 1 VIEW COMPARISON:  Portable chest 04/23/2021 at 8:26 p.m. FINDINGS: Enteric tube has been advanced. The proximal side-hole is no longer at the hiatal level but is also not in the field of view either. Endotracheal tube is in place with tip 5 cm from the carina and there are overlying monitor wires and translucent overlying electrical pad on the left. Left chest loop recorder device and CABG change. Cardiomegaly. There is perihilar vascular congestion, mild interstitial edema in the lower zones with improvement and regression of interstitial edema from the mid field areas. Small pleural effusions appear similar. There is persistent haziness in the lower lung fields which could be atelectasis, pneumonitis or ground-glass edema. No current focal infiltrate in the mid and upper zones. IMPRESSION: 1. enteric tube has been advanced further into the stomach but the side hole and tip are not included in the field. 1. Other support devices as above with overlying monitor wires and electrical pads. 2. Improved interstitial edema, persistent small pleural effusions and persistent basilar hazy opacities. Electronically Signed   By: Telford Nab M.D.   On: 04/24/2021 04:42   ECHOCARDIOGRAM COMPLETE  Result Date: 04/24/2021    ECHOCARDIOGRAM REPORT   Patient Name:   Thomas Moore Date of Exam: 04/24/2021 Medical Rec #:  [id number]      Height:       63.0 in Accession #:    [id number]     Weight:        170.9 lb Date of Birth:  [birth year]       BSA:          1.808 m Patient Age:    61 years       BP:           111/67 mmHg Patient Gender: M              HR:           92 bpm. Exam Location:  [facility] Procedure: 2D Echo Indications:     Cardiomyopathy I42.9  History:         Patient has prior history of  Echocardiogram examinations, most                  recent 09/30/2019.  Sonographer:     Kathlen Brunswick RDCS Referring Phys:  [id number] BRITTON L [place] Diagnosing Phys: Ida Rogue MD IMPRESSIONS  1. Left ventricular ejection fraction, by estimation, is 45 to 50%. The left ventricle has mildly decreased function. The left ventricle demonstrates global hypokinesis. There is moderate left ventricular hypertrophy. Left ventricular diastolic parameters are consistent with Grade II diastolic dysfunction (pseudonormalization).  2. Right ventricular systolic function is normal. The right ventricular size is normal. Tricuspid regurgitation signal is inadequate for assessing PA pressure.  3. Left atrial size was mildly dilated.  4. The mitral valve is normal in structure. Mild mitral valve regurgitation. No evidence of mitral stenosis.  5. The aortic valve is calcified. Aortic valve regurgitation is not visualized. Moderate aortic valve stenosis. Aortic valve area, by VTI measures 0.77 cm. Aortic valve mean gradient measures 19.8 mmHg. Aortic valve Vmax measures 3.03 m/s.  6. The inferior vena cava is normal in size with greater than 50% respiratory variability, suggesting right atrial pressure of 3 mmHg. FINDINGS  Left Ventricle: Left ventricular ejection fraction, by estimation, is 45 to 50%. The left ventricle has mildly decreased function. The left ventricle demonstrates global hypokinesis. The left ventricular internal cavity size was normal in size. There is  moderate left ventricular hypertrophy. Left ventricular diastolic parameters are consistent with Grade II diastolic dysfunction (pseudonormalization). Right  Ventricle: The right ventricular size is normal. No increase in right ventricular wall thickness. Right ventricular systolic function is normal. Tricuspid regurgitation signal is inadequate for assessing PA pressure. Left Atrium: Left atrial size was mildly dilated. Right Atrium: Right atrial size was normal in size. Pericardium: There is no evidence of pericardial effusion. Mitral Valve: The mitral valve is normal in structure. Mild mitral annular calcification. Mild mitral valve regurgitation. No evidence of mitral valve stenosis. Tricuspid Valve: The tricuspid valve is normal in structure. Tricuspid valve regurgitation is not demonstrated. No evidence of tricuspid stenosis. Aortic Valve: The aortic valve is calcified. Aortic valve regurgitation is not visualized. Moderate aortic stenosis is present. Aortic valve mean gradient measures 19.8 mmHg. Aortic valve peak gradient measures 36.7 mmHg. Aortic valve area, by VTI measures 0.77 cm. Pulmonic Valve: The pulmonic valve was normal in structure. Pulmonic valve regurgitation is not visualized. No evidence of pulmonic stenosis. Aorta: The aortic root is normal in size and structure. Venous: The inferior vena cava is normal in size with greater than 50% respiratory variability, suggesting right atrial pressure of 3 mmHg. IAS/Shunts: No atrial level shunt detected by color flow Doppler.  LEFT VENTRICLE PLAX 2D LVIDd:         5.70 cm     Diastology LVIDs:         4.50 cm     LV e' medial:    3.15 cm/s LV PW:         1.30 cm     LV E/e' medial:  34.3 LV IVS:        1.30 cm     LV e' lateral:   2.39 cm/s LVOT diam:     2.00 cm     LV E/e' lateral: 45.2 LV SV:         44 LV SV Index:   24 LVOT Area:     3.14 cm  LV Volumes (MOD) LV vol d, MOD A4C: 97.6 ml LV vol s, MOD A4C: 48.2 ml  LV SV MOD A4C:     97.6 ml RIGHT VENTRICLE RV Basal diam:  2.50 cm RV S prime:     11.60 cm/s TAPSE (M-mode): 1.7 cm LEFT ATRIUM             Index        RIGHT ATRIUM           Index LA diam:         3.50 cm 1.94 cm/m   RA Area:     11.50 cm LA Vol (A2C):   33.0 ml 18.25 ml/m  RA Volume:   26.60 ml  14.71 ml/m LA Vol (A4C):   37.2 ml 20.57 ml/m LA Biplane Vol: 36.9 ml 20.40 ml/m  AORTIC VALVE                     PULMONIC VALVE AV Area (Vmax):    0.76 cm      PV Vmax:       0.92 m/s AV Area (Vmean):   0.70 cm      PV Peak grad:  3.4 mmHg AV Area (VTI):     0.77 cm AV Vmax:           303.00 cm/s AV Vmean:          203.750 cm/s AV VTI:            0.572 m AV Peak Grad:      36.7 mmHg AV Mean Grad:      19.8 mmHg LVOT Vmax:         73.10 cm/s LVOT Vmean:        45.300 cm/s LVOT VTI:          0.140 m LVOT/AV VTI ratio: 0.24  AORTA Ao Root diam: 3.60 cm MITRAL VALVE MV Area (PHT): 3.46 cm     SHUNTS MV Decel Time: 219 msec     Systemic VTI:  0.14 m MV E velocity: 108.00 cm/s  Systemic Diam: 2.00 cm MV A velocity: 74.60 cm/s MV E/A ratio:  1.45 Ida Rogue MD Electronically signed by Ida Rogue MD Signature Date/Time: 04/24/2021/12:49:49 PM    Final      Labs:   Basic Metabolic Panel: Recent Labs  Lab 04/23/21 1906 04/23/21 2239 04/24/21 0651 04/25/21 0418 04/26/21 0514 04/27/21 0402  NA 136  --  140 136 137 137  K 4.2  --  3.9 4.1 4.3 4.1  CL 99  --  104 102 101 99  CO2 25  --  _0 GLUCOSE 280*  --  130* 192* 132* 165*  BUN 18  --  _1 35*  CREATININE 1.09  --  1.06 1.31* 1.19 1.33*  CALCIUM 8.8*  --  8.5* 8.7* 8.9 9.1  MG 1.4* 1.3* 2.4 1.7 1.9 1.8  PHOS  --   --  3.7 4.1 5.0* 5.8*   GFR Estimated Creatinine Clearance: 49.4 mL/min (A) (by C-G formula based on SCr of 1.33 mg/dL (H)). Liver Function Tests: Recent Labs  Lab 04/23/21 1906  AST 20  ALT 14  ALKPHOS 75  BILITOT 0.7  PROT 7.6  ALBUMIN 3.5   No results for input(s): LIPASE, AMYLASE in the last 168 hours. No results for input(s): AMMONIA in the last 168 hours. Coagulation profile Recent Labs  Lab 04/23/21 2239  INR 1.0    CBC: Recent Labs  Lab 04/23/21 1906 04/24/21 0651  04/25/21 0418 04/26/21 0514 04/27/21 0402  WBC  8.9 5.5 6.9 6.4 6.6  NEUTROABS 7.2  --   --   --   --   HGB 11.2* 9.9* 11.0* 10.9* 11.5*  HCT 36.1* 32.2* 34.7* 34.9* 36.5*  MCV 86.0 84.5 84.6 85.1 83.9  PLT 207 188 196 207 227   Cardiac Enzymes: No results for input(s): CKTOTAL, CKMB, CKMBINDEX, TROPONINI in the last 168 hours. BNP: Invalid input(s): POCBNP CBG: Recent Labs  Lab 04/26/21 2046 04/26/21 2355 04/27/21 0453 04/27/21 0731 04/27/21 1121  GLUCAP 176* 140* 157* 164* 371*   D-Dimer No results for input(s): DDIMER in the last 72 hours. Hgb A1c No results for input(s): HGBA1C in the last 72 hours. Lipid Profile No results for input(s): CHOL, HDL, LDLCALC, TRIG, CHOLHDL, LDLDIRECT in the last 72 hours. Thyroid function studies No results for input(s): TSH, T4TOTAL, T3FREE, THYROIDAB in the last 72 hours.  Invalid input(s): FREET3 Anemia work up No results for input(s): VITAMINB12, FOLATE, FERRITIN, TIBC, IRON, RETICCTPCT in the last 72 hours. Microbiology Recent Results (from the past 240 hour(s))  Resp Panel by RT-PCR (Flu A&B, Covid) Nasopharyngeal Swab     Status: None   Collection Time: 04/23/21  7:22 PM   Specimen: Nasopharyngeal Swab; Nasopharyngeal(NP) swabs in vial transport medium  Result Value Ref Range Status   SARS Coronavirus 2 by RT PCR NEGATIVE NEGATIVE Final    Comment: (NOTE) SARS-CoV-2 target nucleic acids are NOT DETECTED.  The SARS-CoV-2 RNA is generally detectable in upper respiratory specimens during the acute phase of infection. The lowest concentration of SARS-CoV-2 viral copies this assay can detect is 138 copies/mL. A negative result does not preclude SARS-Cov-2 infection and should not be used as the sole basis for treatment or other patient management decisions. A negative result may occur with  improper specimen collection/handling, submission of specimen other than nasopharyngeal swab, presence of viral mutation(s) within  the areas targeted by this assay, and inadequate number of viral copies(<138 copies/mL). A negative result must be combined with clinical observations, patient history, and epidemiological information. The expected result is Negative.  Fact Sheet for Patients:  EntrepreneurPulse.com.au  Fact Sheet for Healthcare Providers:  IncredibleEmployment.be  This test is no t yet approved or cleared by the [place] FDA and  has been authorized for detection and/or diagnosis of SARS-CoV-2 by FDA under an Emergency Use Authorization (EUA). This EUA will remain  in effect (meaning this test can be used) for the duration of the COVID-19 declaration under Section 564(b)(1) of the Act, 21 U.S.C.section 360bbb-3(b)(1), unless the authorization is terminated  or revoked sooner.       Influenza A by PCR NEGATIVE NEGATIVE Final   Influenza B by PCR NEGATIVE NEGATIVE Final    Comment: (NOTE) The Xpert Xpress SARS-CoV-2/FLU/RSV plus assay is intended as an aid in the diagnosis of influenza from Nasopharyngeal swab specimens and should not be used as a sole basis for treatment. Nasal washings and aspirates are unacceptable for Xpert Xpress SARS-CoV-2/FLU/RSV testing.  Fact Sheet for Patients: EntrepreneurPulse.com.au  Fact Sheet for Healthcare Providers: IncredibleEmployment.be  This test is not yet approved or cleared by the [place] FDA and has been authorized for detection and/or diagnosis of SARS-CoV-2 by FDA under an Emergency Use Authorization (EUA). This EUA will remain in effect (meaning this test can be used) for the duration of the COVID-19 declaration under Section 564(b)(1) of the Act, 21 U.S.C. section 360bbb-3(b)(1), unless the authorization is terminated or revoked.  Performed at [facility], [place],  [place], [place] [address]      Discharge Instructions:   Discharge  Instructions     Diet - low sodium heart healthy   Complete by: As directed    Diet Carb Modified   Complete by: As directed    Increase activity slowly   Complete by: As directed       Allergies as of 04/27/2021   No Known Allergies      Medication List     STOP taking these medications    lisinopril 40 MG tablet Commonly known as: ZESTRIL       TAKE these medications    atorvastatin 40 MG tablet Commonly known as: LIPITOR TAKE ONE TABLET BY MOUTH AT BEDTIME   blood glucose meter kit and supplies Kit Dispense based on patient and insurance preference. Use up to four times daily as directed. (FOR ICD-9 250.00, 250.01).   calcium citrate-vitamin D 315-200 MG-UNIT tablet Commonly known as: CITRACAL+D Take 1 tablet by mouth 2 (two) times daily.   clopidogrel 75 MG tablet Commonly known as: PLAVIX Take 1 tablet (75 mg total) by mouth daily. Start taking on: April 28, 2021   divalproex 250 MG 24 hr tablet Commonly known as: DEPAKOTE ER Take 250 mg by mouth daily. What changed: Another medication with the same name was changed. Make sure you understand how and when to take each.   divalproex 500 MG DR tablet Commonly known as: DEPAKOTE Take 1 tablet (500 mg total) by mouth at bedtime. What changed:  when to take this reasons to take this   furosemide 20 MG tablet Commonly known as: Lasix Take 1 tablet (20 mg total) by mouth daily. Start taking on: April 28, 2021   glipiZIDE 10 MG 24 hr tablet Commonly known as: GLUCOTROL XL TAKE ONE TABLET BY MOUTH 2 TIMES A DAY   ipratropium 0.06 % nasal spray Commonly known as: ATROVENT Place 2 sprays into both nostrils 3 (three) times daily.   isosorbide mononitrate 30 MG 24 hr tablet Commonly known as: IMDUR Take 30 mg by mouth daily.   metFORMIN 500 MG 24 hr tablet Commonly known as: GLUCOPHAGE-XR TAKE TWO TABLETS BY MOUTH 2 TIMES A DAY   metoprolol succinate 100 MG 24 hr tablet Commonly known as:  TOPROL-XL Take 1 tablet (100 mg total) by mouth daily.   multivitamin capsule Take 1 capsule by mouth daily.   nitroGLYCERIN 0.4 MG SL tablet Commonly known as: NITROSTAT Place under the tongue.   omeprazole 40 MG capsule Commonly known as: PRILOSEC TAKE ONE CAPSULE BY MOUTH EVERY DAY   Procysbi 300 MG Pack Generic drug: Cysteamine Bitartrate as directed And strips and lancets for qd check for dm ii   ramipril 2.5 MG capsule Commonly known as: ALTACE Take 1 capsule (2.5 mg total) by mouth daily. Start taking on: April 28, 2021 What changed:  medication strength how much to take   Trulicity 2.99 ME/2.6ST Sopn Generic drug: Dulaglutide Inject 0.75 mg into the skin once a week. Wednesday   Xarelto 20 MG Tabs tablet Generic drug: rivaroxaban Take 20 mg by mouth daily with supper.           If you experience worsening of your admission symptoms, develop shortness of breath, life threatening emergency, suicidal or homicidal thoughts you must seek medical attention immediately by calling 911 or calling your MD immediately  if symptoms less severe.   You must read complete instructions/literature along with all the possible adverse reactions/side effects for all the  medicines you take and that have been prescribed to you. Take any new medicines after you have completely understood and accept all the possible adverse reactions/side effects.    Please note   You were cared for by a hospitalist during your hospital stay. If you have any questions about your discharge medications or the care you received while you were in the hospital after you are discharged, you can call the unit and asked to speak with the hospitalist on call if the hospitalist that took care of you is not available. Once you are discharged, your primary care physician will handle any further medical issues. Please note that NO REFILLS for any discharge medications will be authorized once you are discharged,  as it is imperative that you return to your primary care physician (or establish a relationship with a primary care physician if you do not have one) for your aftercare needs so that they can reassess your need for medications and monitor your lab values.       Time coordinating discharge: 33 minutes  Signed:  Tyeisha Dinan  [facility] 04/27/2021, 8:27 PM   Pager on [email or web address]. If 7PM-7AM, please contact night-coverage at [email or web address]

## 2021-04-27 NOTE — TOC CM/SW Note (Signed)
Patient has orders to discharge home today. Chart reviewed. On room air. No wounds. No TOC needs identified. CSW signing off. ° °Karnisha Lefebre, CSW °[phone] ° °

## 2021-05-03 NOTE — Progress Notes (Signed)
Carelink Summary Report / Loop Recorder 

## 2021-05-09 DIAGNOSIS — N179 Acute kidney failure, unspecified: Secondary | ICD-10-CM | POA: Diagnosis not present

## 2021-05-09 DIAGNOSIS — E7800 Pure hypercholesterolemia, unspecified: Secondary | ICD-10-CM | POA: Diagnosis not present

## 2021-05-09 DIAGNOSIS — I471 Supraventricular tachycardia: Secondary | ICD-10-CM | POA: Diagnosis not present

## 2021-05-09 DIAGNOSIS — I5022 Chronic systolic (congestive) heart failure: Secondary | ICD-10-CM | POA: Diagnosis not present

## 2021-05-09 DIAGNOSIS — I350 Nonrheumatic aortic (valve) stenosis: Secondary | ICD-10-CM | POA: Diagnosis not present

## 2021-05-09 DIAGNOSIS — I4892 Unspecified atrial flutter: Secondary | ICD-10-CM | POA: Diagnosis not present

## 2021-05-09 DIAGNOSIS — I2510 Atherosclerotic heart disease of native coronary artery without angina pectoris: Secondary | ICD-10-CM | POA: Diagnosis not present

## 2021-05-09 DIAGNOSIS — Z7901 Long term (current) use of anticoagulants: Secondary | ICD-10-CM | POA: Diagnosis not present

## 2021-05-09 DIAGNOSIS — I10 Essential (primary) hypertension: Secondary | ICD-10-CM | POA: Diagnosis not present

## 2021-05-09 DIAGNOSIS — I25810 Atherosclerosis of coronary artery bypass graft(s) without angina pectoris: Secondary | ICD-10-CM | POA: Diagnosis not present

## 2021-05-09 DIAGNOSIS — I110 Hypertensive heart disease with heart failure: Secondary | ICD-10-CM | POA: Diagnosis not present

## 2021-05-09 DIAGNOSIS — E119 Type 2 diabetes mellitus without complications: Secondary | ICD-10-CM | POA: Diagnosis not present

## 2021-05-14 DIAGNOSIS — Z03818 Encounter for observation for suspected exposure to other biological agents ruled out: Secondary | ICD-10-CM | POA: Diagnosis not present

## 2021-05-14 DIAGNOSIS — J019 Acute sinusitis, unspecified: Secondary | ICD-10-CM | POA: Diagnosis not present

## 2021-05-16 ENCOUNTER — Encounter: Payer: Self-pay | Admitting: Cardiothoracic Surgery

## 2021-05-17 DIAGNOSIS — R29818 Other symptoms and signs involving the nervous system: Secondary | ICD-10-CM | POA: Diagnosis not present

## 2021-05-17 DIAGNOSIS — R569 Unspecified convulsions: Secondary | ICD-10-CM | POA: Diagnosis not present

## 2021-05-17 DIAGNOSIS — M25642 Stiffness of left hand, not elsewhere classified: Secondary | ICD-10-CM | POA: Diagnosis not present

## 2021-05-17 DIAGNOSIS — M79646 Pain in unspecified finger(s): Secondary | ICD-10-CM | POA: Diagnosis not present

## 2021-05-17 DIAGNOSIS — M79643 Pain in unspecified hand: Secondary | ICD-10-CM | POA: Diagnosis not present

## 2021-05-18 ENCOUNTER — Other Ambulatory Visit: Payer: Self-pay

## 2021-05-18 ENCOUNTER — Ambulatory Visit: Payer: Medicare HMO | Admitting: Cardiovascular Disease

## 2021-05-18 ENCOUNTER — Encounter: Payer: Self-pay | Admitting: Cardiovascular Disease

## 2021-05-18 VITALS — BP 110/68 | HR 80 | Ht 63.0 in | Wt 157.6 lb

## 2021-05-18 DIAGNOSIS — I350 Nonrheumatic aortic (valve) stenosis: Secondary | ICD-10-CM

## 2021-05-18 LAB — BASIC METABOLIC PANEL
BUN/Creatinine Ratio: 28 — ABNORMAL HIGH (ref 10–24)
BUN: 42 mg/dL — ABNORMAL HIGH (ref 8–27)
CO2: 25 mmol/L (ref 20–29)
Calcium: 9 mg/dL (ref 8.6–10.2)
Chloride: 97 mmol/L (ref 96–106)
Creatinine, Ser: 1.51 mg/dL — ABNORMAL HIGH (ref 0.76–1.27)
Glucose: 89 mg/dL (ref 70–99)
Potassium: 4 mmol/L (ref 3.5–5.2)
Sodium: 141 mmol/L (ref 134–144)
eGFR: 51 mL/min/{1.73_m2} — ABNORMAL LOW (ref >59)

## 2021-05-18 NOTE — Progress Notes (Signed)
Cardiology Office Note:    Date:  05/18/2021   ID:  Thomas Moore, DOB [birth year], MRN [id number]  PCP:  Patient, No Pcp Per (Inactive)   [place] [facility] Cardiologist:  Teodoro Spray, MD Electrophysiologist:  Cristopher Peru, MD     Referring MD: No ref. provider found   Chief Complaint  Patient presents with   Aortic Stenosis    History of Present Illness:    Thomas Moore is a 65 y.o. male referred by Dr Corky Sox for evaluation of aortic stenosis.   The patient is here alone today.  He has a longstanding cardiac history with premature coronary artery disease treated with multivessel CABG in 1997 when he was 65 years old.  Other medical problems include type 2 diabetes, seizure disorder, history of pulmonary embolism maintained on long-term oral anticoagulation with rivaroxaban, hypertension, and hyperlipidemia.  The patient was recently hospitalized at [facility] with left arm pain and chest discomfort in the setting of tachyarrhythmia.  Cardiac catheterization demonstrated stable coronary anatomy with patency of the LIMA to LAD and saphenous vein graft to OM graft.  Medical therapy was recommended.  An echocardiogram during his hospitalization demonstrated findings concerning for low-flow low gradient aortic stenosis.  The patient is referred today for further evaluation.  He reports that he lives alone with no local family.  He has some support from friends and neighbors but this appears to be fairly limited.  He has been followed in the [place] practice for many years by Dr. Ubaldo Glassing and now is under the care of Dr. Corky Sox.  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  He has symptoms of fatigue and mild exertional dyspnea, but no recent chest pain or pressure.  No leg edema, orthopnea, or PND.  He has had syncopal spells of unclear origin in the past, with history of  seizure disorder.  The patient has an implantable loop recorder that has demonstrated no arrhythmic etiology of syncope.  He no longer drives a car.  Past Medical History:  Diagnosis Date   CAD (coronary artery disease) of artery bypass graft    s/p CABG x 4 in 1997   COPD (chronic obstructive pulmonary disease) (HCC)    Coronary artery disease    CVA (cerebral vascular accident) ([place])    Diabetes mellitus without complication ([place])    GERD (gastroesophageal reflux disease)    History of single vessel coronary artery bypass    HLD (hyperlipidemia)    Hypertension    Seizures ([place])     Past Surgical History:  Procedure Laterality Date   BYPASS GRAFT ANGIOGRAPHY N/A 04/25/2021   Procedure: BYPASS GRAFT ANGIOGRAPHY;  Surgeon: Andrez Grime, MD;  Location: [facility];  Service: Cardiovascular;  Laterality: N/A;   CARDIAC SURGERY     CORNEAL TRANSPLANT Right    CORONARY ARTERY BYPASS GRAFT  1997   EYE SURGERY     FLEXOR TENDON REPAIR Left 07/11/2019   Procedure: FLEXOR tenolysis  REPAIR LEFT RING FINGER with tednon repair;  Surgeon: Hessie Knows, MD;  Location: [facility];  Service: Orthopedics;  Laterality: Left;   INCISION AND DRAINAGE ABSCESS Left 05/08/2019   Procedure: INCISION AND DRAINAGE ABSCESS;  Surgeon: Dereck Leep, MD;  Location: [facility];  Service: Orthopedics;  Laterality: Left;   LEFT HEART CATH AND CORS/GRAFTS ANGIOGRAPHY N/A 10/02/2019   Procedure: LEFT HEART CATH AND  CORS/GRAFTS ANGIOGRAPHY;  Surgeon: Leonie Man, MD;  Location: [facility];  Service: Cardiovascular;  Laterality: N/A;   LOOP RECORDER INSERTION N/A 10/06/2019   Procedure: LOOP RECORDER INSERTION;  Surgeon: Evans Lance, MD;  Location: [facility];  Service: Cardiovascular;  Laterality: N/A;    Current Medications: Current Meds  Medication Sig   atorvastatin (LIPITOR) 40 MG tablet TAKE ONE TABLET BY MOUTH AT BEDTIME   blood glucose meter kit and supplies KIT  Dispense based on patient and insurance preference. Use up to four times daily as directed. (FOR ICD-9 250.00, 250.01).   calcium citrate-vitamin D (CITRACAL+D) 315-200 MG-UNIT tablet Take 1 tablet by mouth 2 (two) times daily.   clopidogrel (PLAVIX) 75 MG tablet Take 1 tablet (75 mg total) by mouth daily.   Cysteamine Bitartrate (PROCYSBI) 300 MG PACK as directed And strips and lancets for qd check for dm ii   divalproex (DEPAKOTE ER) 250 MG 24 hr tablet Take 250 mg by mouth daily.   furosemide (LASIX) 20 MG tablet Take 1 tablet (20 mg total) by mouth daily.   glipiZIDE (GLUCOTROL XL) 10 MG 24 hr tablet TAKE ONE TABLET BY MOUTH 2 TIMES A DAY   glucose blood (PRECISION QID TEST) test strip 1 each (1 strip total) 2 (two) times daily Use as instructed.   ipratropium (ATROVENT) 0.06 % nasal spray Place 2 sprays into both nostrils 3 (three) times daily.   isosorbide mononitrate (IMDUR) 30 MG 24 hr tablet Take 30 mg by mouth daily.   metFORMIN (GLUCOPHAGE-XR) 500 MG 24 hr tablet TAKE TWO TABLETS BY MOUTH 2 TIMES A DAY   metoprolol succinate (TOPROL-XL) 100 MG 24 hr tablet Take 1 tablet (100 mg total) by mouth daily.   Multiple Vitamin (MULTIVITAMIN) capsule Take 1 capsule by mouth daily.   nitroGLYCERIN (NITROSTAT) 0.4 MG SL tablet Place under the tongue.   omeprazole (PRILOSEC) 40 MG capsule TAKE ONE CAPSULE BY MOUTH EVERY DAY   ramipril (ALTACE) 2.5 MG capsule Take 1 capsule (2.5 mg total) by mouth daily.   TRUE METRIX BLOOD GLUCOSE TEST test strip SMARTSIG:Via Meter   TRULICITY 7.02 OV/7.8HY SOPN Inject 0.75 mg into the skin once a week. Wednesday   XARELTO 20 MG TABS tablet Take 20 mg by mouth daily with supper.     Allergies:   Patient has no known allergies.   Social History   Socioeconomic History   Marital status: Single    Spouse name: Not on file   Number of children: 0   Years of education: Not on file   Highest education level: High school graduate  Occupational History    Occupation: Unemployed    Comment: Trying to get disability  Tobacco Use   Smoking status: Former    Types: Cigarettes   Smokeless tobacco: Never   Tobacco comments:    Quit 40 years ago  Vaping Use   Vaping Use: Never used  Substance and Sexual Activity   Alcohol use: Not Currently   Drug use: Never   Sexual activity: Never  Other Topics Concern   Not on file  Social History Narrative   ** Merged History Encounter **    Lives in an apartment on [address], has to climb 17 floors   Social Determinants of Health   Financial Resource Strain: Not on file  Food Insecurity: Not on file  Transportation Needs: Not on file  Physical Activity: Not on file  Stress: Not on file  Social Connections: Not on file     Family History: The patient's family history includes Seizures in his brother.  ROS:   Please see the history of present illness.    All other systems reviewed and are negative.  EKGs/Labs/Other Studies Reviewed:    The following studies were reviewed today: Echo: 1. Left ventricular ejection fraction, by estimation, is 45 to 50%. The  left ventricle has mildly decreased function. The left ventricle  demonstrates global hypokinesis. There is moderate left ventricular  hypertrophy. Left ventricular diastolic  parameters are consistent with Grade II diastolic dysfunction  (pseudonormalization).   2. Right ventricular systolic function is normal. The right ventricular  size is normal. Tricuspid regurgitation signal is inadequate for assessing  PA pressure.   3. Left atrial size was mildly dilated.   4. The mitral valve is normal in structure. Mild mitral valve  regurgitation. No evidence of mitral stenosis.   5. The aortic valve is calcified. Aortic valve regurgitation is not  visualized. Moderate aortic valve stenosis. Aortic valve area, by VTI  measures 0.77 cm. Aortic valve mean gradient measures 19.8 mmHg. Aortic  valve Vmax measures 3.03 m/s.   6. The  inferior vena cava is normal in size with greater than 50%  respiratory variability, suggesting right atrial pressure of 3 mmHg.   LEFT VENTRICLE  PLAX 2D  LVIDd:         5.70 cm     Diastology  LVIDs:         4.50 cm     LV e' medial:    3.15 cm/s  LV PW:         1.30 cm     LV E/e' medial:  34.3  LV IVS:        1.30 cm     LV e' lateral:   2.39 cm/s  LVOT diam:     2.00 cm     LV E/e' lateral: 45.2  LV SV:         44  LV SV Index:   24  LVOT Area:     3.14 cm     LV Volumes (MOD)  LV vol d, MOD A4C: 97.6 ml  LV vol s, MOD A4C: 48.2 ml  LV SV MOD A4C:     97.6 ml   RIGHT VENTRICLE  RV Basal diam:  2.50 cm  RV S prime:     11.60 cm/s  TAPSE (M-mode): 1.7 cm   LEFT ATRIUM             Index        RIGHT ATRIUM           Index  LA diam:        3.50 cm 1.94 cm/m   RA Area:     11.50 cm  LA Vol (A2C):   33.0 ml 18.25 ml/m  RA Volume:   26.60 ml  14.71 ml/m  LA Vol (A4C):   37.2 ml 20.57 ml/m  LA Biplane Vol: 36.9 ml 20.40 ml/m   AORTIC VALVE                     PULMONIC VALVE  AV Area (Vmax):    0.76 cm      PV Vmax:       0.92 m/s  AV Area (Vmean):   0.70 cm      PV Peak grad:  3.4 mmHg  AV Area (VTI):     0.77  cm  AV Vmax:           303.00 cm/s  AV Vmean:          203.750 cm/s  AV VTI:            0.572 m  AV Peak Grad:      36.7 mmHg  AV Mean Grad:      19.8 mmHg  LVOT Vmax:         73.10 cm/s  LVOT Vmean:        45.300 cm/s  LVOT VTI:          0.140 m  LVOT/AV VTI ratio: 0.24     AORTA  Ao Root diam: 3.60 cm   MITRAL VALVE  MV Area (PHT): 3.46 cm     SHUNTS  MV Decel Time: 219 msec     Systemic VTI:  0.14 m  MV E velocity: 108.00 cm/s  Systemic Diam: 2.00 cm  MV A velocity: 74.60 cm/s  MV E/A ratio:  1.45   Cardiac Cath:  Prox LAD lesion is 55% stenosed.   Mid LAD lesion is 100% stenosed.   Prox RCA lesion is 90% stenosed.   Prox RCA to Mid RCA lesion is 100% stenosed.   Origin to Prox Graft lesion is 100% stenosed.   Ost Cx to Mid Cx lesion is 100%  stenosed.   Mid Cx to Dist Cx lesion is 80% stenosed with 100% stenosed side branch in 2nd Mrg.   2nd Diag-1 lesion is 80% stenosed.   2nd Diag-2 lesion is 55% stenosed.   1st Sept lesion is 70% stenosed.   RPDA lesion is 80% stenosed.   SVG.   Seq SVG- 1stMrg-2ndMrg and is large.   LIMA and is normal in caliber.   The graft exhibits severe .   The flow is not reversed.   There is no competitive flow   Conclusion: Unchanged coronary anatomy compared to 2019. No culprit lesion for NSTEMI.  Severe 3 vessel CAD including CTO p RCA (not engaged), CTO prox Lcx, CTO mid LAD. There is diffuse disease leading to D1 which is unchanged.  SVG to RCA is occluded (known). SVG to OM1/2 is widely patent.  LIMA to LAD is widely patent   Conclusion: Medical management of CAD. Plan for Plavix and Xarelto x 12 months at discharge at which time plavix can be discontinued.  Tortuosity of the subclavian and radial artery made catheter manipulation difficult. Consider alternative access for future studies.  May need further evaluation of aortic stenosis and atrial arrhythmia that may have driven his presentation. Follow up with me in clinic in 1 week after discharge.    Recommendations  Antiplatelet/Anticoag Recommend to resume Rivaroxaban, at currently prescribed dose and frequency. Recommend concurrent antiplatelet therapy of Clopidogrel 33m daily for 12 months.   Diagnostic Dominance: Right Left Main  Vessel is moderate in size.    Left Anterior Descending  Vessel is moderate in size.  Prox LAD lesion is 55% stenosed. The lesion is eccentric.  Mid LAD lesion is 100% stenosed. The lesion is chronically occluded.    First Diagonal Branch  Vessel is small in size.    First Septal Branch  Vessel is moderate in size. This is a major septal perforator  1st Sept lesion is 70% stenosed.    Second Diagonal Branch  Vessel is moderate in size. There is moderate disease in the vessel.  2nd Diag-1  lesion is 80% stenosed. The lesion is located  at the bifurcation and concentric.  2nd Diag-2 lesion is 55% stenosed. The lesion is eccentric and irregular.    Second Septal Branch  Vessel is small in size.    Third Septal Branch  Vessel is small in size.    Left Circumflex  Vessel is moderate in size. The vessel originates from a separate ostium. Unable to engage  Ost Cx to Mid Cx lesion is 100% stenosed. The lesion is chronically occluded.  Mid Cx to Dist Cx lesion is 80% stenosed with 100% stenosed side branch in 2nd Mrg.    First Obtuse Marginal Branch  Vessel is small in size. There is mild disease in the vessel.    Second Obtuse Marginal Branch  Vessel is small in size. There is moderate disease in the vessel.    Left Posterior Atrioventricular Artery  Vessel is small in size. There is mild disease in the vessel.    Right Coronary Artery  Vessel is small.  Prox RCA lesion is 90% stenosed. The lesion is located proximal to the major branch, segmental and concentric.  Prox RCA to Mid RCA lesion is 100% stenosed. The lesion is chronically occluded.    Acute Marginal Branch  Vessel is small in size.    Right Ventricular Branch  Vessel is small in size.    Right Posterior Descending Artery  Vessel is small in size.  RPDA lesion is 80% stenosed.    Right Posterior Atrioventricular Artery  Vessel is small in size.  Collaterals  RPAV filled by collaterals from Acute Mrg.      First Right Posterolateral Branch  Vessel is small in size. Fills via collaterals from acute marginal branch this retrograde fills all the way up to the occlusion site.    Saphenous Graft To RPDA  SVG. The graft exhibits severe .  Origin to Prox Graft lesion is 100% stenosed. Vessel is the culprit lesion. The lesion is located proximal to the major branch. Proximal occlusion -> likely occurred at the time of his admission; no chance to open.    Sequential Jump Graft Graft To 1st Mrg, 2nd Mrg  Seq  SVG- 1stMrg-2ndMrg and is large.    LIMA LIMA Graft To Mid LAD  LIMA and is normal in caliber. The flow is not reversed. There is no competitive flow    Intervention   No interventions have been documented.   Coronary Diagrams  Diagnostic Dominance: Right  EKG:  EKG is ordered today.  The ekg ordered today demonstrates normal sinus rhythm 80 bpm, nonspecific ST abnormality  Recent Labs: 04/23/2021: ALT 14; TSH 5.958 04/26/2021: B Natriuretic Peptide 486.2 04/27/2021: BUN 35; Creatinine, Ser 1.33; Hemoglobin 11.5; Magnesium 1.8; Platelets 227; Potassium 4.1; Sodium 137  Recent Lipid Panel    Component Value Date/Time   CHOL 143 02/25/2020 1126   TRIG 181 (H) 04/24/2021 0651   HDL 31 (L) 02/25/2020 1126   CHOLHDL 4.6 02/25/2020 1126   CHOLHDL 4.7 09/30/2019 0248   VLDL 24 09/30/2019 0248   LDLCALC 78 02/25/2020 1126     Risk Assessment/Calculations:    STS Risk Calculator:  Risk of Mortality: 1.536% Renal Failure: 1.344% Permanent Stroke: 2.076% Prolonged Ventilation: 6.001% DSW Infection: 0.104% Reoperation: 3.029% Morbidity or Mortality: 9.761% Short Length of Stay: 53.002% Long Length of Stay: 3.224%      Physical Exam:    VS:  BP 110/68    Pulse 80    Ht '5\' 3"'  (1.6 m)    Wt 157 lb 9.6 oz (  71.5 kg)    SpO2 96%    BMI 27.92 kg/m     Wt Readings from Last 3 Encounters:  05/18/21 157 lb 9.6 oz (71.5 kg)  04/27/21 159 lb 14.4 oz (72.5 kg)  07/13/20 176 lb (79.8 kg)     GEN:  Well nourished, well developed in no acute distress HEENT: Normal NECK: No JVD; No carotid bruits LYMPHATICS: No lymphadenopathy CARDIAC: RRR, 3/6 harsh mid to late peaking systolic murmur at the right upper sternal border, diminished A2, no diastolic murmur RESPIRATORY:  Clear to auscultation without rales, wheezing or rhonchi  ABDOMEN: Soft, non-tender, non-distended MUSCULOSKELETAL:  No edema; No deformity  SKIN: Warm and dry NEUROLOGIC:  Alert and oriented x  3 PSYCHIATRIC:  Normal affect   ASSESSMENT:    1. Nonrheumatic aortic valve stenosis    PLAN:    In order of problems listed above:  65 year old male with recent episode of non-ST elevation MI in the setting of tacky arrhythmia.  Hospital course was complicated by acute respiratory failure requiring intubation.  Patient was found to have stable coronary anatomy with patency of the mammary to LAD graft and saphenous vein graft to obtuse marginal with chronically occluded native coronary arteries.  He has been noted to have at least moderate aortic stenosis and there is concern that he may have low-flow low gradient aortic stenosis.  The patient's echo is personally reviewed and demonstrates mild LV systolic dysfunction with LVEF of 45 to 50%.  The aortic valve is poorly visualized but does appear to have severely thickened and restricted leaflet mobility.  There is at least moderate calcification present.  The patient has a peak and mean transaortic velocities of 41 and 22 mmHg, respectively.  The aortic valve dimensionless index is 0.25 with a calculated valve area of 0.8 cm and a low stroke-volume index of only 24.  These findings can be consistent with severe, stage D2 low-flow low gradient aortic stenosis.  The patient has New York Heart Association functional class II symptoms of fatigue and mild dyspnea.  I reviewed the natural history of aortic stenosis with him today.  We discussed treatment options of palliative medical therapy, redo conventional surgical AVR, and transcatheter aortic valve replacement.  Prior to making a decision regarding treatment, I think it is important to confirm that he in fact does have severe low-flow low gradient aortic stenosis.  I have recommended a gated cardiac CTA to provide additional anatomic information regarding his aortic stenosis.  We will obtain an aortic valve calcium score, evaluate leaflet mobility, and assess anatomic feasibility of TAVR.  He will also  have a CTA of the chest abdomen and pelvis to evaluate potential TAVR access.  Depending on the findings of his CT angiography studies, will make recommendations on TAVR versus continued clinical surveillance.  Our multidisciplinary heart valve team will review his case once his CT studies are completed.  After reviewing his cardiac catheterization films, I agree that no revascularization is indicated and that continued medical therapy is appropriate.  Of note, the patient appears to have a limited social support network with no local family support.  A neighbor drove him to the appointment today.      Medication Adjustments/Labs and Tests Ordered: Current medicines are reviewed at length with the patient today.  Concerns regarding medicines are outlined above.  Orders Placed This Encounter  Procedures   CT CORONARY MORPH W/CTA COR W/SCORE W/CA W/CM &/OR WO/CM   CT ANGIO ABDOMEN PELVIS  W &/  OR WO CONTRAST   CT ANGIO CHEST AORTA W/CM & OR WO/CM   Basic metabolic panel   EKG 48-EFUW   No orders of the defined types were placed in this encounter.   Patient Instructions  Medication Instructions:  Your physician recommends that you continue on your current medications as directed. Please refer to the Current Medication list given to you today.  *If you need a refill on your cardiac medications before your next appointment, please call your pharmacy*   Lab Work: BMET today  If you have labs (blood work) drawn today and your tests are completely normal, you will receive your results only by: [place] (if you have MyChart) OR A paper copy in the mail If you have any lab test that is abnormal or we need to change your treatment, we will call you to review the results.   Testing/Procedures: See letter for CT instructions   Follow-Up:  The structural team will be in contact with next steps after CT scans are completed.      Other Instructions     Signed, Sherren Mocha, MD   05/18/2021 9:58 AM    [place]

## 2021-05-18 NOTE — Patient Instructions (Signed)
Medication Instructions:  Your physician recommends that you continue on your current medications as directed. Please refer to the Current Medication list given to you today.  *If you need a refill on your cardiac medications before your next appointment, please call your pharmacy*   Lab Work: BMET today  If you have labs (blood work) drawn today and your tests are completely normal, you will receive your results only by: MyChart Message (if you have MyChart) OR A paper copy in the mail If you have any lab test that is abnormal or we need to change your treatment, we will call you to review the results.   Testing/Procedures: See letter for CT instructions   Follow-Up:  The structural team will be in contact with next steps after CT scans are completed.      Other Instructions

## 2021-05-18 NOTE — Progress Notes (Signed)
Pre Surgical Assessment: 5 M Walk Test  20M=16.63ft  5 Meter Walk Test- trial 1: 7.52 seconds 5 Meter Walk Test- trial 2: 7.32 seconds 5 Meter Walk Test- trial 3: 7.91 seconds 5 Meter Walk Test Average: 7.58 seconds

## 2021-05-20 DIAGNOSIS — I25810 Atherosclerosis of coronary artery bypass graft(s) without angina pectoris: Secondary | ICD-10-CM | POA: Diagnosis not present

## 2021-05-20 DIAGNOSIS — J40 Bronchitis, not specified as acute or chronic: Secondary | ICD-10-CM | POA: Diagnosis not present

## 2021-05-23 NOTE — Progress Notes (Signed)
Patient aware that he needs to hold Ramipril the day before and day of. He wrote these instructions on his pre CT TAVR letter.

## 2021-05-23 NOTE — Progress Notes (Signed)
Spoke to pt who understands not to take his ramipril the day before and after.

## 2021-05-24 LAB — CUP PACEART REMOTE DEVICE CHECK
Date Time Interrogation Session: 20221217173723
Implantable Pulse Generator Implant Date: 20210503

## 2021-05-31 ENCOUNTER — Ambulatory Visit (INDEPENDENT_AMBULATORY_CARE_PROVIDER_SITE_OTHER): Payer: Medicare HMO

## 2021-05-31 DIAGNOSIS — I214 Non-ST elevation (NSTEMI) myocardial infarction: Secondary | ICD-10-CM

## 2021-06-08 ENCOUNTER — Ambulatory Visit (HOSPITAL_COMMUNITY)
Admission: RE | Admit: 2021-06-08 | Discharge: 2021-06-08 | Disposition: A | Discharge status: Home / Self Care | Payer: Medicare HMO | Source: Ambulatory Visit | Attending: Cardiovascular Disease | Admitting: Cardiovascular Disease

## 2021-06-08 ENCOUNTER — Other Ambulatory Visit: Payer: Self-pay

## 2021-06-08 DIAGNOSIS — J9811 Atelectasis: Secondary | ICD-10-CM | POA: Diagnosis not present

## 2021-06-08 DIAGNOSIS — I350 Nonrheumatic aortic (valve) stenosis: Secondary | ICD-10-CM | POA: Diagnosis not present

## 2021-06-08 DIAGNOSIS — I517 Cardiomegaly: Secondary | ICD-10-CM | POA: Diagnosis not present

## 2021-06-08 MED ORDER — METOPROLOL TARTRATE 5 MG/5ML IV SOLN
INTRAVENOUS | Status: AC
  Filled 2021-06-08: qty 10

## 2021-06-08 MED ORDER — DILTIAZEM HCL 25 MG/5ML IV SOLN: 5.0000 mg | INTRAVENOUS | Status: DC | PRN

## 2021-06-08 MED ORDER — IOHEXOL 350 MG/ML SOLN
100.0000 mL | Freq: Once | INTRAVENOUS | Status: AC | PRN
  Administered 2021-06-08: 100 mL via INTRAVENOUS

## 2021-06-08 MED ORDER — DILTIAZEM HCL 25 MG/5ML IV SOLN
INTRAVENOUS | Status: AC
  Filled 2021-06-08: qty 5

## 2021-06-08 MED ORDER — METOPROLOL TARTRATE 5 MG/5ML IV SOLN
5.0000 mg | INTRAVENOUS | Status: DC | PRN
  Administered 2021-06-08 (×2): 10 mg via INTRAVENOUS

## 2021-06-08 NOTE — Progress Notes (Signed)
Carelink Summary Report / Loop Recorder 

## 2021-06-17 DIAGNOSIS — I779 Disorder of arteries and arterioles, unspecified: Secondary | ICD-10-CM | POA: Diagnosis not present

## 2021-06-17 DIAGNOSIS — R569 Unspecified convulsions: Secondary | ICD-10-CM | POA: Diagnosis not present

## 2021-06-17 DIAGNOSIS — I10 Essential (primary) hypertension: Secondary | ICD-10-CM | POA: Diagnosis not present

## 2021-06-17 DIAGNOSIS — E119 Type 2 diabetes mellitus without complications: Secondary | ICD-10-CM | POA: Diagnosis not present

## 2021-06-29 ENCOUNTER — Institutional Professional Consult (permissible substitution): Payer: Medicare HMO | Admitting: Surgery

## 2021-06-29 ENCOUNTER — Other Ambulatory Visit: Payer: Self-pay

## 2021-06-29 ENCOUNTER — Encounter: Payer: Self-pay | Admitting: Surgery

## 2021-06-29 VITALS — BP 122/68 | HR 80 | Resp 20 | Ht 63.0 in | Wt 161.0 lb

## 2021-06-29 DIAGNOSIS — I471 Supraventricular tachycardia: Secondary | ICD-10-CM | POA: Diagnosis not present

## 2021-06-29 DIAGNOSIS — I350 Nonrheumatic aortic (valve) stenosis: Secondary | ICD-10-CM

## 2021-06-29 DIAGNOSIS — I2510 Atherosclerotic heart disease of native coronary artery without angina pectoris: Secondary | ICD-10-CM | POA: Diagnosis not present

## 2021-06-29 DIAGNOSIS — I10 Essential (primary) hypertension: Secondary | ICD-10-CM | POA: Diagnosis not present

## 2021-06-29 DIAGNOSIS — I5023 Acute on chronic systolic (congestive) heart failure: Secondary | ICD-10-CM | POA: Diagnosis not present

## 2021-06-29 NOTE — Progress Notes (Signed)
Patient ID: Thomas Moore, male   DOB: Sep 04, 1955, 66 y.o.   MRN: [id number]  [facility]INIC        [address]       [place] [address]             [phone]          CARDIOTHORACIC SURGERY CONSULTATION REPORT  PCP is Default, Provider, MD Referring Provider is Sherren Mocha, MD Primary Cardiologist is Donnelly Angelica, MD ( previously Jordan Hawks, MD)  Reason for consultation: Severe aortic stenosis  HPI:  The patient is a 66 year old gentleman with a history of type 2 diabetes, hyperlipidemia, hypertension, prior stroke, COPD, coronary disease status post CABG x4 in 1997, pulmonary embolism maintained on long-term anticoagulation with Xarelto, and aortic stenosis who is being evaluated for aortic valve replacement.  He had an echocardiogram in April 2021 that showed mild aortic stenosis with a mean gradient of 11.5 mmHg and a peak gradient of 20 mmHg.  Valve area at that time was 1.73 cm.  The valve had an indeterminate number of cusps.  He was hospitalized at [facility] in November 2022 with chest discomfort and left arm pain in the setting of tachyarrhythmia.  Cardiac catheterization showed stable coronary anatomy with a patent LIMA to the LAD and saphenous vein graft to the obtuse marginal.  A 2D echocardiogram on 04/24/2021 showed a calcified aortic valve with a mean gradient of 19.8 mmHg and a peak gradient of 36.7 mmHg.  Aortic valve area was measured at 0.77 cm by VTI.  Left ventricular ejection fraction was 45 to 50% with global hypokinesis.  There is moderate LVH with grade 2 diastolic dysfunction.  Stroke-volume index was 24.  He reports progressive exertional fatigue and shortness of breath which is limiting his activity.  He denies any peripheral edema.  He denies orthopnea.  He has history of syncopal spells in the past and history of seizure disorder.  He had an implantable loop recorder that demonstrated no  arrhythmic etiology of his syncope.  He is no longer allowed to drive.  He is divorced and lives alone.  He still has a good relationship with his ex-wife.  He said that he works in the [employer] at [employer] for 18 years and also worked in the [employer].    Past Medical History:  Diagnosis Date   CAD (coronary artery disease) of artery bypass graft    s/p CABG x 4 in 1997   COPD (chronic obstructive pulmonary disease) (HCC)    Coronary artery disease    CVA (cerebral vascular accident) ([place])    Diabetes mellitus without complication ([place])    GERD (gastroesophageal reflux disease)    History of single vessel coronary artery bypass    HLD (hyperlipidemia)    Hypertension    Seizures ([place])     Past Surgical History:  Procedure Laterality Date   BYPASS GRAFT ANGIOGRAPHY N/A 04/25/2021   Procedure: BYPASS GRAFT ANGIOGRAPHY;  Surgeon: Andrez Grime, MD;  Location: [place] [facility];  Service: Cardiovascular;  Laterality: N/A;   CARDIAC SURGERY     CORNEAL TRANSPLANT Right    CORONARY ARTERY BYPASS GRAFT  1997   EYE SURGERY     FLEXOR TENDON REPAIR Left 07/11/2019   Procedure: FLEXOR tenolysis  REPAIR LEFT RING FINGER with tednon repair;  Surgeon: Hessie Knows, MD;  Location: [facility];  Service: Orthopedics;  Laterality:  Left;   INCISION AND DRAINAGE ABSCESS Left 05/08/2019   Procedure: INCISION AND DRAINAGE ABSCESS;  Surgeon: Dereck Leep, MD;  Location: [facility];  Service: Orthopedics;  Laterality: Left;   LEFT HEART CATH AND CORS/GRAFTS ANGIOGRAPHY N/A 10/02/2019   Procedure: LEFT HEART CATH AND CORS/GRAFTS ANGIOGRAPHY;  Surgeon: Leonie Man, MD;  Location: [place] [facility];  Service: Cardiovascular;  Laterality: N/A;   LOOP RECORDER INSERTION N/A 10/06/2019   Procedure: LOOP RECORDER INSERTION;  Surgeon: Evans Lance, MD;  Location: [place] [facility];  Service: Cardiovascular;  Laterality: N/A;    Family History  Problem  Relation Age of Onset   Seizures Brother     Social History   Socioeconomic History   Marital status: Single    Spouse name: Not on file   Number of children: 0   Years of education: Not on file   Highest education level: High school graduate  Occupational History   Occupation: Unemployed    Comment: Trying to get disability  Tobacco Use   Smoking status: Former    Types: Cigarettes   Smokeless tobacco: Never   Tobacco comments:    Quit 40 years ago  Vaping Use   Vaping Use: Never used  Substance and Sexual Activity   Alcohol use: Not Currently   Drug use: Never   Sexual activity: Never  Other Topics Concern   Not on file  Social History Narrative   ** Merged History Encounter **    Lives in an apartment on [address], has to climb 17 floors   Social Determinants of Health   Financial Resource Strain: Not on file  Food Insecurity: Not on file  Transportation Needs: Not on file  Physical Activity: Not on file  Stress: Not on file  Social Connections: Not on file  Intimate Partner Violence: Not on file    Prior to Admission medications   Medication Sig Start Date End Date Taking? Authorizing Provider  blood glucose meter kit and supplies KIT Dispense based on patient and insurance preference. Use up to four times daily as directed. (FOR ICD-9 250.00, 250.01). 02/19/20  Yes Iloabachie, Chioma E, NP  calcium citrate-vitamin D (CITRACAL+D) 315-200 MG-UNIT tablet Take 1 tablet by mouth 2 (two) times daily.   Yes [provider]  clopidogrel (PLAVIX) 75 MG tablet Take 1 tablet (75 mg total) by mouth daily. 04/28/21 04/28/22 Yes Jennye Boroughs, MD  divalproex (DEPAKOTE ER) 250 MG 24 hr tablet Take 250 mg by mouth daily. 01/26/21  Yes [provider]  divalproex (DEPAKOTE) 500 MG DR tablet Take 1 tablet (500 mg total) by mouth at bedtime. 04/27/21  Yes Jennye Boroughs, MD  glucose blood (PRECISION QID TEST) test strip 1 each (1 strip total) 2 (two) times daily  Use as instructed. 03/07/21 03/07/22 Yes [provider]  ipratropium (ATROVENT) 0.06 % nasal spray Place 2 sprays into both nostrils 3 (three) times daily. 12/13/20  Yes [provider]  isosorbide mononitrate (IMDUR) 30 MG 24 hr tablet Take 30 mg by mouth daily. 12/28/20  Yes [provider]  metoprolol succinate (TOPROL-XL) 100 MG 24 hr tablet Take 1 tablet (100 mg total) by mouth daily. 03/25/20  Yes Iloabachie, Chioma E, NP  Multiple Vitamin (MULTIVITAMIN) capsule Take 1 capsule by mouth daily.   Yes [provider]  nitroGLYCERIN (NITROSTAT) 0.4 MG SL tablet Place under the tongue. 01/27/21 10/05/22 Yes [provider]  ramipril (ALTACE) 2.5 MG capsule Take 1 capsule (2.5  mg total) by mouth daily. 04/28/21  Yes Jennye Boroughs, MD  TRUE METRIX BLOOD GLUCOSE TEST test strip SMARTSIG:Via Meter 03/07/21  Yes [provider]  TRULICITY 3.54 SF/6.8LE SOPN Inject 0.75 mg into the skin once a week. Wednesday 02/19/20  Yes Iloabachie, Chioma E, NP  XARELTO 20 MG TABS tablet Take 20 mg by mouth daily with supper. 12/28/20  Yes [provider]  atorvastatin (LIPITOR) 40 MG tablet TAKE ONE TABLET BY MOUTH AT BEDTIME 03/25/20 05/18/21  Iloabachie, Chioma E, NP  furosemide (LASIX) 20 MG tablet Take 1 tablet (20 mg total) by mouth daily. 04/28/21 05/28/21  Jennye Boroughs, MD  glipiZIDE (GLUCOTROL XL) 10 MG 24 hr tablet TAKE ONE TABLET BY MOUTH 2 TIMES A DAY 03/25/20 05/18/21  Iloabachie, Chioma E, NP  metFORMIN (GLUCOPHAGE-XR) 500 MG 24 hr tablet TAKE TWO TABLETS BY MOUTH 2 TIMES A DAY 03/25/20 05/18/21  Iloabachie, Chioma E, NP  omeprazole (PRILOSEC) 40 MG capsule TAKE ONE CAPSULE BY MOUTH EVERY DAY 03/25/20 05/18/21  Iloabachie, Chioma E, NP    Current Outpatient Medications  Medication Sig Dispense Refill   blood glucose meter kit and supplies KIT Dispense based on patient and insurance preference. Use up to four times daily as directed. (FOR ICD-9  250.00, 250.01). 1 each 0   calcium citrate-vitamin D (CITRACAL+D) 315-200 MG-UNIT tablet Take 1 tablet by mouth 2 (two) times daily.     clopidogrel (PLAVIX) 75 MG tablet Take 1 tablet (75 mg total) by mouth daily. 30 tablet 0   divalproex (DEPAKOTE ER) 250 MG 24 hr tablet Take 250 mg by mouth daily.     divalproex (DEPAKOTE) 500 MG DR tablet Take 1 tablet (500 mg total) by mouth at bedtime.     glucose blood (PRECISION QID TEST) test strip 1 each (1 strip total) 2 (two) times daily Use as instructed.     ipratropium (ATROVENT) 0.06 % nasal spray Place 2 sprays into both nostrils 3 (three) times daily.     isosorbide mononitrate (IMDUR) 30 MG 24 hr tablet Take 30 mg by mouth daily.     metoprolol succinate (TOPROL-XL) 100 MG 24 hr tablet Take 1 tablet (100 mg total) by mouth daily. 30 tablet 3   Multiple Vitamin (MULTIVITAMIN) capsule Take 1 capsule by mouth daily.     nitroGLYCERIN (NITROSTAT) 0.4 MG SL tablet Place under the tongue.     ramipril (ALTACE) 2.5 MG capsule Take 1 capsule (2.5 mg total) by mouth daily. 30 capsule 0   TRUE METRIX BLOOD GLUCOSE TEST test strip SMARTSIG:Via Meter     TRULICITY 7.51 ZG/0.1VC SOPN Inject 0.75 mg into the skin once a week. Wednesday 1 mL 4   XARELTO 20 MG TABS tablet Take 20 mg by mouth daily with supper.     atorvastatin (LIPITOR) 40 MG tablet TAKE ONE TABLET BY MOUTH AT BEDTIME 30 tablet 3   furosemide (LASIX) 20 MG tablet Take 1 tablet (20 mg total) by mouth daily. 30 tablet 0   glipiZIDE (GLUCOTROL XL) 10 MG 24 hr tablet TAKE ONE TABLET BY MOUTH 2 TIMES A DAY 60 tablet 3   metFORMIN (GLUCOPHAGE-XR) 500 MG 24 hr tablet TAKE TWO TABLETS BY MOUTH 2 TIMES A DAY 120 tablet 3   omeprazole (PRILOSEC) 40 MG capsule TAKE ONE CAPSULE BY MOUTH EVERY DAY 30 capsule 3   No current facility-administered medications for this visit.    No Known Allergies    Review of Systems:   General:  Normal  appetite, + decreased energy, no weight gain, no weight loss,  no fever  Cardiac:  no chest pain with exertion, no chest pain at rest, +SOB with moderate exertion, no resting SOB, no PND, no orthopnea, no palpitations, no arrhythmia, no atrial fibrillation, no LE edema, no dizzy spells, + syncope  Respiratory:  + exertional shortness of breath, no home oxygen, no productive cough, no dry cough, no bronchitis, no wheezing, no hemoptysis, no asthma, no pain with inspiration or cough, no sleep apnea, no CPAP at night  GI:   no difficulty swallowing, no reflux, no frequent heartburn, no hiatal hernia, no abdominal pain, no constipation, no diarrhea, no hematochezia, no hematemesis, no melena  GU:   no dysuria,  no frequency, no urinary tract infection, no hematuria, no enlarged prostate, no kidney stones, no kidney disease  Vascular:  no pain suggestive of claudication, no pain in feet, no leg cramps, no varicose veins, no DVT, no non-healing foot ulcer  Neuro:   no stroke, no TIA's, no seizures, no headaches, no temporary blindness one eye,  no slurred speech, no peripheral neuropathy, no chronic pain, no instability of gait, + memory/cognitive dysfunction  Musculoskeletal: no arthritis, no joint swelling, no myalgias, no difficulty walking, normal mobility   Skin:   no rash, no itching, no skin infections, no pressure sores or ulcerations  Psych:   no anxiety, no depression, no nervousness, no unusual recent stress  Eyes:   no blurry vision, no floaters, no recent vision changes, + wears glasses  ENT:   + hearing loss, no loose or painful teeth, no dentures, sees dentist regularly  Hematologic:  + easy bruising, no abnormal bleeding, no clotting disorder, no frequent epistaxis  Endocrine:  + diabetes, does check CBG's at home     Physical Exam:   BP 122/68 (BP Location: Right Arm, Patient Position: Sitting)    Pulse 80    Resp 20    Ht _0  (1.6 m)    Wt 161 lb (73 kg)    SpO2 96% Comment: RA   BMI 28.52 kg/m   General:  well-appearing  HEENT:  Unremarkable,  NCAT, PERLA, EOMI  Neck:   no JVD, no bruits, no adenopathy   Chest:   clear to auscultation, symmetrical breath sounds, no wheezes, no rhonchi   CV:   RRR, 3/6 systolic murmur RSB, no diastolic murmur  Abdomen:  soft, non-tender, no masses   Extremities:  warm, well-perfused, pulses palpable in ankles, no lower extremity edema  Rectal/GU  Deferred  Neuro:   Grossly non-focal and symmetrical throughout  Skin:   Clean and dry, no rashes, no breakdown  Diagnostic Tests:  ECHOCARDIOGRAM REPORT         Patient Name:   DRAYTON TIEU Date of Exam: 04/24/2021  Medical Rec #:  [id number]      Height:       63.0 in  Accession #:    [id number]     Weight:       170.9 lb  Date of Birth:  01/07/1956       BSA:          1.808 m  Patient Age:    60 years       BP:           111/67 mmHg  Patient Gender: M              HR:           92 bpm.  Exam Location:  [facility]   Procedure: 2D Echo   Indications:     Cardiomyopathy I42.9     History:         Patient has prior history of Echocardiogram examinations,  most                   recent 09/30/2019.     Sonographer:     Kathlen Brunswick RDCS  Referring Phys:  [id number] BRITTON L RUST-CHESTER  Diagnosing Phys: Ida Rogue MD   IMPRESSIONS     1. Left ventricular ejection fraction, by estimation, is 45 to 50%. The  left ventricle has mildly decreased function. The left ventricle  demonstrates global hypokinesis. There is moderate left ventricular  hypertrophy. Left ventricular diastolic  parameters are consistent with Grade II diastolic dysfunction  (pseudonormalization).   2. Right ventricular systolic function is normal. The right ventricular  size is normal. Tricuspid regurgitation signal is inadequate for assessing  PA pressure.   3. Left atrial size was mildly dilated.   4. The mitral valve is normal in structure. Mild mitral valve  regurgitation. No evidence of mitral stenosis.   5. The aortic valve is calcified. Aortic valve  regurgitation is not  visualized. Moderate aortic valve stenosis. Aortic valve area, by VTI  measures 0.77 cm. Aortic valve mean gradient measures 19.8 mmHg. Aortic  valve Vmax measures 3.03 m/s.   6. The inferior vena cava is normal in size with greater than 50%  respiratory variability, suggesting right atrial pressure of 3 mmHg.   FINDINGS   Left Ventricle: Left ventricular ejection fraction, by estimation, is 45  to 50%. The left ventricle has mildly decreased function. The left  ventricle demonstrates global hypokinesis. The left ventricular internal  cavity size was normal in size. There is   moderate left ventricular hypertrophy. Left ventricular diastolic  parameters are consistent with Grade II diastolic dysfunction  (pseudonormalization).   Right Ventricle: The right ventricular size is normal. No increase in  right ventricular wall thickness. Right ventricular systolic function is  normal. Tricuspid regurgitation signal is inadequate for assessing PA  pressure.   Left Atrium: Left atrial size was mildly dilated.   Right Atrium: Right atrial size was normal in size.   Pericardium: There is no evidence of pericardial effusion.   Mitral Valve: The mitral valve is normal in structure. Mild mitral annular  calcification. Mild mitral valve regurgitation. No evidence of mitral  valve stenosis.   Tricuspid Valve: The tricuspid valve is normal in structure. Tricuspid  valve regurgitation is not demonstrated. No evidence of tricuspid  stenosis.   Aortic Valve: The aortic valve is calcified. Aortic valve regurgitation is  not visualized. Moderate aortic stenosis is present. Aortic valve mean  gradient measures 19.8 mmHg. Aortic valve peak gradient measures 36.7  mmHg. Aortic valve area, by VTI  measures 0.77 cm.   Pulmonic Valve: The pulmonic valve was normal in structure. Pulmonic valve  regurgitation is not visualized. No evidence of pulmonic stenosis.   Aorta: The  aortic root is normal in size and structure.   Venous: The inferior vena cava is normal in size with greater than 50%  respiratory variability, suggesting right atrial pressure of 3 mmHg.   IAS/Shunts: No atrial level shunt detected by color flow Doppler.      LEFT VENTRICLE  PLAX 2D  LVIDd:         5.70 cm     Diastology  LVIDs:  4.50 cm     LV e' medial:    3.15 cm/s  LV PW:         1.30 cm     LV E/e' medial:  34.3  LV IVS:        1.30 cm     LV e' lateral:   2.39 cm/s  LVOT diam:     2.00 cm     LV E/e' lateral: 45.2  LV SV:         44  LV SV Index:   24  LVOT Area:     3.14 cm     LV Volumes (MOD)  LV vol d, MOD A4C: 97.6 ml  LV vol s, MOD A4C: 48.2 ml  LV SV MOD A4C:     97.6 ml   RIGHT VENTRICLE  RV Basal diam:  2.50 cm  RV S prime:     11.60 cm/s  TAPSE (M-mode): 1.7 cm   LEFT ATRIUM             Index        RIGHT ATRIUM           Index  LA diam:        3.50 cm 1.94 cm/m   RA Area:     11.50 cm  LA Vol (A2C):   33.0 ml 18.25 ml/m  RA Volume:   26.60 ml  14.71 ml/m  LA Vol (A4C):   37.2 ml 20.57 ml/m  LA Biplane Vol: 36.9 ml 20.40 ml/m   AORTIC VALVE                     PULMONIC VALVE  AV Area (Vmax):    0.76 cm      PV Vmax:       0.92 m/s  AV Area (Vmean):   0.70 cm      PV Peak grad:  3.4 mmHg  AV Area (VTI):     0.77 cm  AV Vmax:           303.00 cm/s  AV Vmean:          203.750 cm/s  AV VTI:            0.572 m  AV Peak Grad:      36.7 mmHg  AV Mean Grad:      19.8 mmHg  LVOT Vmax:         73.10 cm/s  LVOT Vmean:        45.300 cm/s  LVOT VTI:          0.140 m  LVOT/AV VTI ratio: 0.24     AORTA  Ao Root diam: 3.60 cm   MITRAL VALVE  MV Area (PHT): 3.46 cm     SHUNTS  MV Decel Time: 219 msec     Systemic VTI:  0.14 m  MV E velocity: 108.00 cm/s  Systemic Diam: 2.00 cm  MV A velocity: 74.60 cm/s  MV E/A ratio:  1.45   Ida Rogue MD  Electronically signed by Ida Rogue MD  Signature Date/Time: 04/24/2021/12:49:49 PM          Final     Physicians  Panel Physicians Referring Physician Case Authorizing Physician  Andrez Grime, MD (Primary)  Andrez Grime, MD   Procedures  BYPASS GRAFT ANGIOGRAPHY   Conclusion      Prox LAD lesion is 55% stenosed.   Mid LAD lesion is 100% stenosed.   Prox  RCA lesion is 90% stenosed.   Prox RCA to Mid RCA lesion is 100% stenosed.   Origin to Prox Graft lesion is 100% stenosed.   Ost Cx to Mid Cx lesion is 100% stenosed.   Mid Cx to Dist Cx lesion is 80% stenosed with 100% stenosed side branch in 2nd Mrg.   2nd Diag-1 lesion is 80% stenosed.   2nd Diag-2 lesion is 55% stenosed.   1st Sept lesion is 70% stenosed.   RPDA lesion is 80% stenosed.   SVG.   Seq SVG- 1stMrg-2ndMrg and is large.   LIMA and is normal in caliber.   The graft exhibits severe .   The flow is not reversed.   There is no competitive flow   Conclusion: Unchanged coronary anatomy compared to 2019. No culprit lesion for NSTEMI.  Severe 3 vessel CAD including CTO p RCA (not engaged), CTO prox Lcx, CTO mid LAD. There is diffuse disease leading to D1 which is unchanged.  SVG to RCA is occluded (known). SVG to OM1/2 is widely patent.  LIMA to LAD is widely patent   Conclusion: Medical management of CAD. Plan for Plavix and Xarelto x 12 months at discharge at which time plavix can be discontinued.  Tortuosity of the subclavian and radial artery made catheter manipulation difficult. Consider alternative access for future studies.  May need further evaluation of aortic stenosis and atrial arrhythmia that may have driven his presentation. Follow up with me in clinic in 1 week after discharge.    Recommendations  Antiplatelet/Anticoag Recommend to resume Rivaroxaban, at currently prescribed dose and frequency. Recommend concurrent antiplatelet therapy of Clopidogrel 4m daily for 12 months.   Indications  NSTEMI (non-ST elevated myocardial infarction) ([place] [I21.4 (ICD-10-CM)]    Procedural Details  Technical Details Cardiac Catheterization Procedure Note  Procedural details: The radial artery was prepped, draped, and anesthetized with 1% lidocaine.  Using a micropuncture wire, a 29F slender sheath was placed in the left radial artery using the modified Seldinger technique.  A versacore wire was needed to traverse tortuous anatomy in the radial artery. A guidewire was advanced into the ascending aorta at which time systemic heparin was administered.  Please note, catheter manipulation was very difficult d/t subclavian artery tortuosity and radial tortuosity.  The SVG grafts and the LIMA were engaged using a JR4, and the left coronary was engaged using a JL 3.5.  There were no immediate complications and the patient was transferred to the postcatheterization recovery area for further monitoring.      Estimated blood loss <50 mL.   During this procedure medications were administered to achieve and maintain moderate conscious sedation while the patient's heart rate, blood pressure, and oxygen saturation were continuously monitored and I was present face-to-face 100% of this time.   Medications (Filter: Administrations occurring from 0840 to 0926 on 04/25/21)  important  Continuous medications are totaled by the amount administered until 04/25/21 0926.   Heparin (Porcine) in NaCl 1000-0.9 UT/500ML-% SOLN (mL) Total volume:  1,000 mL Date/Time Rate/Dose/Volume Action   04/25/21 0843 500 mL Given   0843 500 mL Given    lidocaine (PF) (XYLOCAINE) 1 % injection (mL) Total volume:  2 mL Date/Time Rate/Dose/Volume Action   04/25/21 0848 2 mL Given    verapamil (ISOPTIN) injection (mg) Total dose:  2.5 mg Date/Time Rate/Dose/Volume Action   04/25/21 0855 2.5 mg Given    heparin sodium (porcine) injection (Units) Total dose:  4,000 Units Date/Time Rate/Dose/Volume Action   04/25/21 0902  3,000 Units Given   0903 1,000 Units Given    iohexol (OMNIPAQUE) 350 MG/ML  injection (mL) Total volume:  60 mL Date/Time Rate/Dose/Volume Action   04/25/21 0924 60 mL Given    midazolam (VERSED) injection (mg) Total dose:  1 mg Date/Time Rate/Dose/Volume Action   04/25/21 0848 1 mg Given    fentaNYL (SUBLIMAZE) injection (mcg) Total dose:  25 mcg Date/Time Rate/Dose/Volume Action   04/25/21 0848 25 mcg Given    Sedation Time  Sedation Time Physician-1: 32 minutes 2 seconds Contrast  Medication Name Total Dose  iohexol (OMNIPAQUE) 350 MG/ML injection 60 mL   Radiation/Fluoro  Fluoro time: 11.3 (min) DAP: 30.5 (Gycm2) Cumulative Air Kerma: 831 (mGy) Complications  Complications documented before study signed (04/25/2021  5:17 AM)   No complications were associated with this study.  Documented by Gildardo Pounds, RN - 04/25/2021  9:24 AM     Coronary Findings  Diagnostic Dominance: Right Left Main  Vessel is moderate in size.    Left Anterior Descending  Vessel is moderate in size.  Prox LAD lesion is 55% stenosed. The lesion is eccentric.  Mid LAD lesion is 100% stenosed. The lesion is chronically occluded.    First Diagonal Branch  Vessel is small in size.    First Septal Branch  Vessel is moderate in size. This is a major septal perforator  1st Sept lesion is 70% stenosed.    Second Diagonal Branch  Vessel is moderate in size. There is moderate disease in the vessel.  2nd Diag-1 lesion is 80% stenosed. The lesion is located at the bifurcation and concentric.  2nd Diag-2 lesion is 55% stenosed. The lesion is eccentric and irregular.    Second Septal Branch  Vessel is small in size.    Third Septal Branch  Vessel is small in size.    Left Circumflex  Vessel is moderate in size. The vessel originates from a separate ostium. Unable to engage  Ost Cx to Mid Cx lesion is 100% stenosed. The lesion is chronically occluded.  Mid Cx to Dist Cx lesion is 80% stenosed with 100% stenosed side branch in 2nd Mrg.    First Obtuse  Marginal Branch  Vessel is small in size. There is mild disease in the vessel.    Second Obtuse Marginal Branch  Vessel is small in size. There is moderate disease in the vessel.    Left Posterior Atrioventricular Artery  Vessel is small in size. There is mild disease in the vessel.    Right Coronary Artery  Vessel is small.  Prox RCA lesion is 90% stenosed. The lesion is located proximal to the major branch, segmental and concentric.  Prox RCA to Mid RCA lesion is 100% stenosed. The lesion is chronically occluded.    Acute Marginal Branch  Vessel is small in size.    Right Ventricular Branch  Vessel is small in size.    Right Posterior Descending Artery  Vessel is small in size.  RPDA lesion is 80% stenosed.    Right Posterior Atrioventricular Artery  Vessel is small in size.  Collaterals  RPAV filled by collaterals from Acute Mrg.      First Right Posterolateral Branch  Vessel is small in size. Fills via collaterals from acute marginal branch this retrograde fills all the way up to the occlusion site.    Saphenous Graft To RPDA  SVG. The graft exhibits severe .  Origin to Prox Graft lesion is 100% stenosed. Vessel is the culprit lesion.  The lesion is located proximal to the major branch. Proximal occlusion -> likely occurred at the time of his admission; no chance to open.    Sequential Jump Graft Graft To 1st Mrg, 2nd Mrg  Seq SVG- 1stMrg-2ndMrg and is large.    LIMA LIMA Graft To Mid LAD  LIMA and is normal in caliber. The flow is not reversed. There is no competitive flow    Intervention   No interventions have been documented.   Coronary Diagrams  Diagnostic Dominance: Right Intervention    ADDENDUM REPORT: 06/08/2021 19:13   CLINICAL DATA:  Severe Aortic Stenosis.   EXAM: Cardiac TAVR CT   TECHNIQUE: A non-contrast, gated CT scan was obtained with axial slices of 3 mm through the heart for aortic valve calcium scoring. A 130 kV retrospective,  gated, contrast cardiac scan was obtained. Gantry rotation speed was 250 msecs and collimation was 0.6 mm. Nitroglycerin was not given. The 3D data set was reconstructed in 5% intervals of the 0-95% of the R-R cycle. Systolic and diastolic phases were analyzed on a dedicated workstation using MPR, MIP, and VRT modes. The patient received 100 cc of contrast.   FINDINGS: Image quality: Excellent.   Noise artifact is: Limited.   Valve Morphology: The aortic valve tricuspid. The valve is diffusely calcified. There is restricted motion in systole consistent with severe aortic stenosis.   Aortic Valve Calcium score: 1781   Aortic annular dimension:   Phase assessed: 25%   Annular area: 464 mm2   Annular perimeter: 79.4 mm   Max diameter: 29.0 mm   Min diameter: 21.6 mm   Annular and subannular calcification: Trace, single calcification under the [place] that is layered.   Membranous septum: 3.3 mm   Optimal coplanar projection: RAO 19 CAU 2   Coronary Artery Height above Annulus:   Left Main: 14.0 mm   Right Coronary: 23.4 mm   Sinus of Valsalva Measurements:   Non-coronary: 38.5 mm   Right-coronary: 39.8 mm   Left-coronary: 40.2 mm   Sinus of Valsalva Height:   Non-coronary: 25.6 mm   Right-coronary: 27.1 mm   Left-coronary: 23.7 mm   Sinotubular Junction: 3.26 mm   Ascending Thoracic Aorta: 35.8 mm   Coronary Arteries: Normal coronary origin. Right dominance. The study was performed without use of NTG and is insufficient for plaque evaluation. Please refer to recent cardiac catheterization for coronary assessment. LIMA-LAD is patent. SVG to an OM branch is patent. SVG-RCA is occluded. Severe 3-vessel coronary calcifications present of the native circulation.   Cardiac Morphology:   Right Atrium: Right atrial size is within normal limits. Contrast reflux into the IVC consistent with elevated RA pressure.   Right Ventricle: The right ventricular cavity  is within normal limits.   Left Atrium: Left atrial size is normal in size. Contrast mixing artifact is present in the LAA, cannot exclude thrombus.   Left Ventricle: The ventricular cavity size is within normal limits. There are no stigmata of prior infarction. There is no abnormal filling defect. Mildly reduced left ventricular function, LVEF=46%. Global hypokinesis. Postoperative septal movement.   Pulmonary arteries: Normal in size without proximal filling defect.   Pulmonary veins: Normal pulmonary venous drainage.   Pericardium: Normal thickness with no significant effusion or calcium present.   Mitral Valve: The mitral valve is normal structure without significant calcification.   Extra-cardiac findings: See attached radiology report for non-cardiac structures.   IMPRESSION: 1. Annular measurements appropriate for 26 mm S3 TAVR (464 mm2).  2. Trace, single calcification under the [place] that is layered.   3. Sufficient coronary to annulus distance.   4. Short membranous septum length (3.3 mm).   5. Optimal Fluoroscopic Angle for Delivery: RAO 19 CAU 2   6. Mildly reduced left ventricular function, LVEF=46%. Global hypokinesis.   7. Contrast mixing artifact is present in the LAA, cannot exclude thrombus.   8. Patent LIMA-LAD and SVG-OM.  Occluded SVG-RCA.   [place] T. Audie Box, MD     Electronically Signed   By: Eleonore Chiquito M.D.   On: 06/08/2021 19:13    Addended by Geralynn Rile, MD on 06/08/2021  7:15 PM   Study Result  Narrative & Impression  EXAM: OVER-READ INTERPRETATION  CT CHEST   The following report is an over-read performed by radiologist Dr. Yetta Glassman of [facility], [place] on 06/08/2021. This over-read does not include interpretation of cardiac or coronary anatomy or pathology. The coronary calcium score/coronary CTA interpretation by the cardiologist is attached.   COMPARISON:  None.   FINDINGS: Extracardiac findings  will be described separately under dictation for contemporaneously obtained CTA chest, abdomen and pelvis.   IMPRESSION: Please see separate dictation for contemporaneously obtained CTA chest, abdomen and pelvis dated 06/08/2021 for full description of relevant extracardiac findings.   Electronically Signed: By: Yetta Glassman M.D. On: 06/08/2021 12:32    Narrative & Impression  CLINICAL DATA:  66 year-old male, pre treatment planning for TAVR   EXAM: CTA chest, ABDOMEN AND PELVIS WITHOUT AND WITH CONTRAST   TECHNIQUE: Multidetector CT imaging of the chest, abdomen and pelvis was performed using the standard protocol during bolus administration of intravenous contrast. Multiplanar reconstructed images and MIPs were obtained and reviewed to evaluate the vascular anatomy.   CONTRAST:  17m OMNIPAQUE IOHEXOL 350 MG/ML SOLN   COMPARISON:  CT chest dated April 23, 2021   FINDINGS: CTA CHEST FINDINGS   Cardiovascular: Cardiomegaly. No significant pericardial effusion/thickening. Left main and three-vessel coronary artery calcifications status post CABG. Patent vein graft to a diagonal artery and patent LIMA to the LAD. Occluded RCA vein graft. Great vessels are normal in course and caliber. No central pulmonary emboli.   Mediastinum/Nodes: No discrete thyroid nodules. Patulous esophagus. Enlarged mediastinal and hilar lymph nodes, slightly decreased in size when compared with prior exam. Reference subcarinal lymph node measuring 1.5 cm in short axis on series 12, image 57, previously 1.8 cm. And reference right hilar lymph node measuring 1.2 cm on series 12, image 69.   Lungs/Pleura: No pneumothorax. No pleural effusion. Right greater than left bibasilar atelectasis. No consolidation.   Musculoskeletal: No aggressive appearing focal osseous lesions. Median sternotomy wires are intact.   CTA ABDOMEN AND PELVIS FINDINGS   Hepatobiliary: Normal liver with no liver  mass. Normal gallbladder with no radiopaque cholelithiasis. No biliary ductal dilatation.   Pancreas: Normal, with no mass or duct dilation.   Spleen: Normal size. No mass.   Adrenals/Urinary Tract: Normal adrenals. Kidneys enhance symmetrically with no evidence of hydronephrosis. Normal bladder.   Stomach/Bowel: Normal non-distended stomach. Normal caliber small bowel with no small bowel wall thickening. Normal appendix. Normal large bowel with no diverticulosis, large bowel wall thickening or pericolonic fat stranding.   Vascular/Lymphatic: Normal caliber abdominal aorta with calcified and noncalcified plaque. Major branch vessels are patent. Two bilateral renal arteries with mild narrowing at the origins due to calcified and noncalcified plaque. No pathologically enlarged lymph nodes in the abdomen or pelvis.   Reproductive: Prostatomegaly, measuring up  to 5.2 cm.   Other: No pneumoperitoneum, ascites or focal fluid collection.   Musculoskeletal: No aggressive appearing focal osseous lesions. Mild superior endplate deformity of L5 with associated Schmorl's node.   VASCULAR MEASUREMENTS PERTINENT TO TAVR:   AORTA:   Minimal Aortic Diameter-9.3 mm   Severity of Aortic Calcification-moderate to severe   RIGHT PELVIS:   Right Common Iliac Artery -   Minimal Diameter-4.6 mm   Tortuosity-none   Calcification-moderate   Right External Iliac Artery -   Minimal Diameter-6.4 mm   Tortuosity-mild   Calcification-mild   Right Common Femoral Artery -   Minimal Diameter-5.2 mm   Tortuosity-none   Calcification-mild   LEFT PELVIS:   Left Common Iliac Artery -   Minimal Diameter-5.8 mm   Tortuosity-none   Calcification-moderate   Left External Iliac Artery -   Minimal Diameter-8.1 mm   Tortuosity-none   Calcification-mild   Left Common Femoral Artery -   Minimal Diameter-5.7 mm   Tortuosity-none   Calcification-mild   Review of the MIP images  confirms the above findings.   IMPRESSION: 1. Vascular findings and measurements pertinent to potential TAVR procedure, as detailed above. 2. Severe thickening calcification of the aortic valve, compatible with reported clinical history of severe aortic stenosis. 3. 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 Left main and 3 vessel coronary artery disease status post CABG. 4. Enlarged mediastinal and hilar lymph nodes are stable to slightly decreased in size when compared with prior exam and likely reactive.     Electronically Signed   By: Yetta Glassman M.D.   On: 06/08/2021 13:49   Impression:   This 66 year old gentleman has stage D2, severe, symptomatic, low-flow/low gradient aortic stenosis with New York Heart Association class II symptoms of exertional fatigue and shortness of breath consistent with chronic diastolic congestive heart failure.  He presented in November with a non-ST segment elevation MI in the setting of tachyarrhythmia with acute respiratory failure requiring intubation.  I have personally reviewed his 2D echocardiogram, cardiac catheterization, and CTA studies.  His echo in November 2022 showed a calcified and thickened aortic valve with restricted leaflet mobility.  The mean gradient was 22 mmHg with a peak gradient of 41 mmHg.  Valve area was 0.8 cm with a dimensionless index of 0.25 and a low stroke-volume index of 24.  His valve certainly looks like a severely stenotic valve.  I agree that aortic valve replacement is indicated in this patient for relief of his symptoms and to prevent progressive left ventricular deterioration.  With prior coronary bypass graft surgery in addition to multiple other comorbidities I think that transcatheter aortic valve replacement is the best option for treating him.  His gated cardiac CTA shows anatomy suitable for TAVR using a SAPIEN 3 valve.  His abdominal and pelvic CTA shows bilateral common iliac artery calcific  atherosclerosis but I think there is adequate lumen to allow transfemoral insertion.  The patient was counseled at length regarding treatment alternatives for management of severe symptomatic aortic stenosis. The risks and benefits of surgical intervention has been discussed in detail. Long-term prognosis with medical therapy was discussed. Alternative approaches such as conventional surgical aortic valve replacement, transcatheter aortic valve replacement, and palliative medical therapy were compared and contrasted at length. This discussion was placed in the context of the patient's own specific clinical presentation and past medical history. All of his questions have been addressed.   Following the decision to proceed with transcatheter aortic valve replacement, a discussion was held regarding  what types of management strategies would be attempted intraoperatively in the event of life-threatening complications, including whether or not the patient would be considered a candidate for the use of cardiopulmonary bypass and/or conversion to open sternotomy for attempted surgical intervention.  With prior coronary bypass graft surgery I do not think he would be a candidate for emergent sternotomy to manage any intraoperative complications. The patient is aware of the fact that transient use of cardiopulmonary bypass may be necessary. The patient has been advised of a variety of complications that might develop including but not limited to risks of death, stroke, paravalvular leak, aortic dissection or other major vascular complications, aortic annulus rupture, device embolization, cardiac rupture or perforation, mitral regurgitation, acute myocardial infarction, arrhythmia, heart block or bradycardia requiring permanent pacemaker placement, congestive heart failure, respiratory failure, renal failure, pneumonia, infection, other late complications related to structural valve deterioration or migration, or other  complications that might ultimately cause a temporary or permanent loss of functional independence or other long term morbidity. The patient provides full informed consent for the procedure as described and all questions were answered.    Plan:  He will be scheduled for transfemoral TAVR on 07/05/2021 using a SAPIEN 3 valve.  I spent 60 minutes performing this consultation and > 50% of this time was spent face to face counseling and coordinating the care of this patient's severe symptomatic aortic stenosis.  Gaye Pollack, MD 06/29/2021 12:26 PM

## 2021-06-30 NOTE — Progress Notes (Signed)
Surgical Instructions   Your procedure is scheduled on Tuesday 07/05/2021.  Report to [facility] Main Entrance "A" at 08:45 A.M., then check in with the Admitting office.  Call [phone] if you have problems or questions between now and the morning of surgery:   Remember: Do not eat or drink after midnight the night before your surgery    STOP now taking any Aspirin (unless otherwise instructed by your surgeon), Aleve, Naproxen, Ibuprofen, Motrin, Advil, Goody's, BC's, all herbal medications, fish oil, and all non-prescription vitamins.    Stop taking Xarelto and Metformin on 1/29 (Sunday). You will take your last doses on 1/28 (Saturday).   Continue taking all other medications including Plavix without change through the day before surgery. On the morning of surgery do not take any medications.  WHAT DO I DO ABOUT MY DIABETES MEDICATION? Do not take oral diabetes medicines (pills) the morning of surgery.  STOP your metformin (Glucophage) on 07/03/21 as instructed by Dr. Earmon [place] office.  Glipizide (Glucotrol) - Take only morning and/or lunch doses.  DO NOT take evening dose.  HOW TO MANAGE YOUR DIABETES BEFORE AND AFTER SURGERY  Why is it important to control my blood sugar before and after surgery? Improving blood sugar levels before and after surgery helps healing and can limit problems. A way of improving blood sugar control is eating a healthy diet by:  Eating less sugar and carbohydrates  Increasing activity/exercise  Talking with your doctor about reaching your blood sugar goals High blood sugars (greater than 180 mg/dL) can raise your risk of infections and slow your recovery, so you will need to focus on controlling your diabetes during the weeks before surgery. Make sure that the doctor who takes care of your diabetes knows about your planned surgery including the date and location.  How do I manage my blood sugar before surgery? Check your blood sugar at least 4  times a day, starting 2 days before surgery, to make sure that the level is not too high or low.  Check your blood sugar the morning of your surgery when you wake up and every 2 hours until you get to the Short Stay unit.  If your blood sugar is less than 70 mg/dL, you will need to treat for low blood sugar: Do not take insulin. Treat a low blood sugar (less than 70 mg/dL) with  cup of clear juice (cranberry or apple), 4 glucose tablets, OR glucose gel. Recheck blood sugar in 15 minutes after treatment (to make sure it is greater than 70 mg/dL). If your blood sugar is not greater than 70 mg/dL on recheck, call [phone] for further instructions. Report your blood sugar to the short stay nurse when you get to Short Stay.  If you are admitted to the hospital after surgery: Your blood sugar will be checked by the staff and you will probably be given insulin after surgery (instead of oral diabetes medicines) to make sure you have good blood sugar levels. The goal for blood sugar control after surgery is 80-180 mg/dL.     After your pre-procedure COVID test  You are not required to quarantine however you are required to wear a well-fitting mask when you are out and around people not in your household.  If your mask becomes wet or soiled, replace with a new one.  Wash your hands often with soap and water for 20 seconds or clean your hands with an alcohol-based hand sanitizer that contains at least 60% alcohol.  Do  not share personal items.  Notify your provider: if you are in close contact with someone who has COVID  or if you develop a fever of 100.4 or greater, sneezing, cough, sore throat, shortness of breath or body aches.          Do not wear jewelry or makeup  Do not wear lotions, powders, perfumes/colognes, or deodorant.  Do not shave 48 hours prior to surgery.  Men may shave face and neck.  Do not wear nail polish, gel polish, artificial nails, or any other type of covering  on natural nails including fingernails and toenails. If patients have artificial nails, gel coating, etc. that need to be removed by a nail salon please have this removed prior to surgery or surgery may need to be canceled/delayed if the surgeon/ anesthesia feels like the patient is unable to be adequately monitored.  Do not bring valuables to the hospital - [facility] is not responsible for any belongings or valuables.  Do NOT Smoke (Tobacco/Vaping) or drink Alcohol 24 hours prior to your procedure  If you use a CPAP at night, please bring your mask for your overnight stay.   Contacts, glasses, hearing aids, dentures or partials may not be worn into surgery, please bring cases for these belongings   For patients admitted to the hospital, discharge time will be determined by your treatment team.   Patients discharged the day of surgery will not be allowed to drive home, and someone needs to stay with them for 24 hours.  NO VISITORS WILL BE ALLOWED IN PRE-OP WHERE PATIENTS ARE PREPPED FOR SURGERY.  ONLY 1 SUPPORT PERSON MAY BE PRESENT IN THE WAITING ROOM WHILE YOU ARE IN SURGERY.  IF YOU ARE TO BE ADMITTED, ONCE YOU ARE IN YOUR ROOM YOU WILL BE ALLOWED TWO (2) VISITORS. 1 (ONE) VISITOR MAY STAY OVERNIGHT BUT MUST ARRIVE TO THE ROOM BY 8pm.  Minor children may have two parents present. Special consideration for safety and communication needs will be reviewed on a case by case basis.  Special instructions:    Oral Hygiene is also important to reduce your risk of infection.  Remember - BRUSH YOUR TEETH THE MORNING OF SURGERY WITH YOUR REGULAR TOOTHPASTE   [facility]- Preparing For Surgery  Before surgery, you can play an important role. Because skin is not sterile, your skin needs to be as free of germs as possible. You can reduce the number of germs on your skin by washing with CHG (chlorahexidine gluconate) Soap before surgery.  CHG is an antiseptic cleaner which kills germs and bonds with  the skin to continue killing germs even after washing.     Please do not use if you have an allergy to CHG or antibacterial soaps. If your skin becomes reddened/irritated stop using the CHG.  Do not shave (including legs and underarms) for at least 48 hours prior to first CHG shower. It is OK to shave your face.  Please follow these instructions carefully.     Shower the NIGHT BEFORE SURGERY and the MORNING OF SURGERY with CHG Soap.   If you chose to wash your hair, wash your hair first as usual with your normal shampoo. After you shampoo, rinse your hair and body thoroughly to remove the shampoo.    Then [employer] and genitals (private parts) with your normal soap and rinse thoroughly to remove soap.  Next use the CHG Soap as you would any other liquid soap. You can apply CHG directly to  the skin and wash gently with a clean washcloth.   Apply the CHG Soap to your body ONLY FROM THE NECK DOWN.  Do not use on open wounds or open sores. Avoid contact with your eyes, ears, mouth and genitals (private parts). Wash Face and genitals (private parts)  with your normal soap.   Wash thoroughly, paying special attention to the area where your surgery will be performed.  Thoroughly rinse your body with warm water from the neck down.  DO NOT shower/wash with your normal soap after using and rinsing off the CHG Soap.  Pat yourself dry with a CLEAN TOWEL.  Wear CLEAN PAJAMAS to bed the night before surgery  Place CLEAN SHEETS on your bed the night before your surgery  DO NOT SLEEP WITH PETS.   Day of Surgery:  Take a shower with CHG soap. Wear Clean/Comfortable clothing the morning of surgery Do not apply any deodorants/lotions.   Remember to brush your teeth WITH YOUR REGULAR TOOTHPASTE.   Please read over the fact sheets that you were given.

## 2021-07-01 ENCOUNTER — Ambulatory Visit (HOSPITAL_COMMUNITY)
Admission: RE | Admit: 2021-07-01 | Discharge: 2021-07-01 | Disposition: A | Discharge status: Home / Self Care | Payer: Medicare HMO | Source: Ambulatory Visit | Attending: Cardiovascular Disease | Admitting: Cardiovascular Disease

## 2021-07-01 ENCOUNTER — Other Ambulatory Visit (HOSPITAL_COMMUNITY): Payer: Medicare HMO

## 2021-07-01 ENCOUNTER — Other Ambulatory Visit: Payer: Self-pay

## 2021-07-01 ENCOUNTER — Encounter (HOSPITAL_COMMUNITY): Payer: Self-pay

## 2021-07-01 ENCOUNTER — Encounter (HOSPITAL_COMMUNITY)
Admission: RE | Admit: 2021-07-01 | Discharge: 2021-07-01 | Disposition: A | Discharge status: Home / Self Care | Payer: Medicare HMO | Source: Ambulatory Visit | Attending: Cardiovascular Disease | Admitting: Cardiovascular Disease

## 2021-07-01 VITALS — BP 143/72 | HR 100 | Temp 98.0°F | Resp 19 | Ht 63.0 in | Wt 163.0 lb

## 2021-07-01 DIAGNOSIS — I252 Old myocardial infarction: Secondary | ICD-10-CM | POA: Diagnosis not present

## 2021-07-01 DIAGNOSIS — Z01818 Encounter for other preprocedural examination: Secondary | ICD-10-CM | POA: Diagnosis not present

## 2021-07-01 DIAGNOSIS — J449 Chronic obstructive pulmonary disease, unspecified: Secondary | ICD-10-CM | POA: Insufficient documentation

## 2021-07-01 DIAGNOSIS — I25810 Atherosclerosis of coronary artery bypass graft(s) without angina pectoris: Secondary | ICD-10-CM | POA: Insufficient documentation

## 2021-07-01 DIAGNOSIS — E119 Type 2 diabetes mellitus without complications: Secondary | ICD-10-CM | POA: Diagnosis not present

## 2021-07-01 DIAGNOSIS — J439 Emphysema, unspecified: Secondary | ICD-10-CM | POA: Diagnosis not present

## 2021-07-01 DIAGNOSIS — I5022 Chronic systolic (congestive) heart failure: Secondary | ICD-10-CM | POA: Insufficient documentation

## 2021-07-01 DIAGNOSIS — Z20822 Contact with and (suspected) exposure to covid-19: Secondary | ICD-10-CM | POA: Diagnosis not present

## 2021-07-01 DIAGNOSIS — I110 Hypertensive heart disease with heart failure: Secondary | ICD-10-CM | POA: Insufficient documentation

## 2021-07-01 DIAGNOSIS — E785 Hyperlipidemia, unspecified: Secondary | ICD-10-CM | POA: Insufficient documentation

## 2021-07-01 DIAGNOSIS — I350 Nonrheumatic aortic (valve) stenosis: Secondary | ICD-10-CM

## 2021-07-01 DIAGNOSIS — Z8673 Personal history of transient ischemic attack (TIA), and cerebral infarction without residual deficits: Secondary | ICD-10-CM | POA: Insufficient documentation

## 2021-07-01 HISTORY — DX: Cardiac arrhythmia, unspecified: I49.9

## 2021-07-01 LAB — CBC
HCT: 33.2 % — ABNORMAL LOW (ref 39.0–52.0)
Hemoglobin: 10 g/dL — ABNORMAL LOW (ref 13.0–17.0)
MCH: 26.7 pg (ref 26.0–34.0)
MCHC: 30.1 g/dL (ref 30.0–36.0)
MCV: 88.8 fL (ref 80.0–100.0)
Platelets: 204 10*3/uL (ref 150–400)
RBC: 3.74 MIL/uL — ABNORMAL LOW (ref 4.22–5.81)
RDW: 17.2 % — ABNORMAL HIGH (ref 11.5–15.5)
WBC: 5.9 10*3/uL (ref 4.0–10.5)
nRBC: 0 % (ref 0.0–0.2)

## 2021-07-01 LAB — BLOOD GAS, ARTERIAL
Acid-Base Excess: 0.2 mmol/L (ref 0.0–2.0)
Bicarbonate: 24.2 mmol/L (ref 20.0–28.0)
Drawn by: 60286
FIO2: 21
O2 Saturation: 97.4 %
Patient temperature: 37
pCO2 arterial: 39 mmHg (ref 32.0–48.0)
pH, Arterial: 7.41 (ref 7.350–7.450)
pO2, Arterial: 96.6 mmHg (ref 83.0–108.0)

## 2021-07-01 LAB — COMPREHENSIVE METABOLIC PANEL
ALT: 10 U/L (ref 0–44)
AST: 21 U/L (ref 15–41)
Albumin: 3.3 g/dL — ABNORMAL LOW (ref 3.5–5.0)
Alkaline Phosphatase: 55 U/L (ref 38–126)
Anion gap: 11 (ref 5–15)
BUN: 16 mg/dL (ref 8–23)
CO2: 22 mmol/L (ref 22–32)
Calcium: 9.1 mg/dL (ref 8.9–10.3)
Chloride: 105 mmol/L (ref 98–111)
Creatinine, Ser: 1.1 mg/dL (ref 0.61–1.24)
GFR, Estimated: >60 mL/min (ref >60)
Glucose, Bld: 78 mg/dL (ref 70–99)
Potassium: 4.3 mmol/L (ref 3.5–5.1)
Sodium: 138 mmol/L (ref 135–145)
Total Bilirubin: 0.2 mg/dL — ABNORMAL LOW (ref 0.3–1.2)
Total Protein: 7.6 g/dL (ref 6.5–8.1)

## 2021-07-01 LAB — TYPE AND SCREEN
ABO/RH(D): O POS
Antibody Screen: NEGATIVE

## 2021-07-01 LAB — URINALYSIS, ROUTINE W REFLEX MICROSCOPIC
Bacteria, UA: NONE SEEN
Bilirubin Urine: NEGATIVE
Glucose, UA: NEGATIVE mg/dL
Ketones, ur: 5 mg/dL — AB
Leukocytes,Ua: NEGATIVE
Nitrite: NEGATIVE
Protein, ur: 100 mg/dL — AB
Specific Gravity, Urine: 1.018 (ref 1.005–1.030)
pH: 6 (ref 5.0–8.0)

## 2021-07-01 LAB — HEMOGLOBIN A1C
Hgb A1c MFr Bld: 6.4 % — ABNORMAL HIGH (ref 4.8–5.6)
Mean Plasma Glucose: 136.98 mg/dL

## 2021-07-01 LAB — GLUCOSE, CAPILLARY: Glucose-Capillary: 101 mg/dL — ABNORMAL HIGH (ref 70–99)

## 2021-07-01 LAB — PROTIME-INR
INR: 1.1 (ref 0.8–1.2)
Prothrombin Time: 14.1 seconds (ref 11.4–15.2)

## 2021-07-01 LAB — SURGICAL PCR SCREEN
MRSA, PCR: POSITIVE — AB
Staphylococcus aureus: POSITIVE — AB

## 2021-07-01 LAB — BRAIN NATRIURETIC PEPTIDE: B Natriuretic Peptide: 635.8 pg/mL — ABNORMAL HIGH (ref 0.0–100.0)

## 2021-07-01 LAB — SARS CORONAVIRUS 2 (TAT 6-24 HRS): SARS Coronavirus 2: NEGATIVE

## 2021-07-01 NOTE — Progress Notes (Signed)
PCP - Dr. Einar Crow Cardiologist -  Dr. Beatrix Fetters [facility])  PPM/ICD - n/a Device Orders -  Rep Notified -   Chest x-ray - 07/01/21 EKG - 07/01/21 Stress Test - 01/19/21 ECHO - 04/24/21 Cardiac Cath - 04/25/21  Sleep Study -  CPAP -   Fasting Blood Sugar - 100's Checks Blood Sugar 1-2 times a day  Blood Thinner Instructions: last dose of Xarelto should be 1/28; continue plavix, do not take DOS Aspirin Instructions:  ERAS Protcol - NPO after midnight PRE-SURGERY Ensure or G2-   COVID TEST- at PAT appointment   Anesthesia review: yes  Patient denies shortness of breath, fever, cough and chest pain at PAT appointment   All instructions explained to the patient, with a verbal understanding of the material. Patient agrees to go over the instructions while at home for a better understanding. Patient also instructed to wear a mask after being tested for COVID-19. The opportunity to ask questions was provided.

## 2021-07-04 ENCOUNTER — Ambulatory Visit (INDEPENDENT_AMBULATORY_CARE_PROVIDER_SITE_OTHER): Payer: Medicare HMO

## 2021-07-04 ENCOUNTER — Encounter (HOSPITAL_COMMUNITY): Payer: Self-pay

## 2021-07-04 ENCOUNTER — Encounter: Payer: Medicare HMO | Admitting: Surgery

## 2021-07-04 DIAGNOSIS — I471 Supraventricular tachycardia: Secondary | ICD-10-CM | POA: Diagnosis not present

## 2021-07-04 LAB — CUP PACEART REMOTE DEVICE CHECK
Date Time Interrogation Session: 20230129231749
Implantable Pulse Generator Implant Date: 20210503

## 2021-07-04 MED ORDER — DEXMEDETOMIDINE HCL IN NACL 400 MCG/100ML IV SOLN
0.1000 ug/kg/h | INTRAVENOUS | Status: AC
  Administered 2021-07-05: 1 ug/kg/h via INTRAVENOUS
  Filled 2021-07-04: qty 100

## 2021-07-04 MED ORDER — CEFAZOLIN SODIUM-DEXTROSE 2-4 GM/100ML-% IV SOLN
2.0000 g | INTRAVENOUS | Status: AC
  Administered 2021-07-05: 2 g via INTRAVENOUS
  Filled 2021-07-04: qty 100

## 2021-07-04 MED ORDER — HEPARIN 30,000 UNITS/1000 ML (OHS) CELLSAVER SOLUTION
Status: DC
  Filled 2021-07-04: qty 1000

## 2021-07-04 MED ORDER — MAGNESIUM SULFATE 50 % IJ SOLN
40.0000 meq | INTRAMUSCULAR | Status: DC
  Filled 2021-07-04: qty 9.85

## 2021-07-04 MED ORDER — POTASSIUM CHLORIDE 2 MEQ/ML IV SOLN
80.0000 meq | INTRAVENOUS | Status: DC
  Filled 2021-07-04: qty 40

## 2021-07-04 MED ORDER — NOREPINEPHRINE 4 MG/250ML-% IV SOLN
0.0000 ug/min | INTRAVENOUS | Status: AC
  Administered 2021-07-05: 1 ug/min via INTRAVENOUS
  Filled 2021-07-04: qty 250
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
## 2021-07-04 NOTE — Anesthesia Preprocedure Evaluation (Addendum)
Anesthesia Evaluation  Patient identified by MRN, date of birth, ID band Patient awake    Reviewed: Allergy & Precautions, NPO status , Patient's Chart, lab work & pertinent test results  Airway Mallampati: III  TM Distance: >3 FB Neck ROM: Full    Dental no notable dental hx.    Pulmonary COPD, former smoker,    Pulmonary exam normal breath sounds clear to auscultation       Cardiovascular hypertension, Pt. on home beta blockers + CAD, + Past MI and + CABG  + Valvular Problems/Murmurs AS  Rhythm:Regular Rate:Normal + Systolic murmurs ECHO: Left ventricular ejection fraction, by estimation, is 45 to 50%. The left ventricle has mildly decreased function. The left ventricle demonstrates global hypokinesis. There is moderate left ventricular hypertrophy. Left ventricular diastolic parameters are consistent with Grade II diastolic dysfunction (pseudonormalization).  Right ventricular systolic function is normal. The right ventricular size is normal. Tricuspid regurgitation signal is inadequate for assessing PA pressure.  Left atrial size was mildly dilated.  The mitral valve is normal in structure. Mild mitral valve  regurgitation. No evidence of mitral stenosis.  The aortic valve is calcified. Aortic valve regurgitation is not visualized. Moderate aortic valve stenosis. Aortic valve area, by VTI measures 0.77 cm. Aortic valve mean gradient measures 19.8 mmHg. Aortic valve Vmax measures 3.03 m/s.  The inferior vena cava is normal in size with greater than 50% respiratory variability, suggesting right atrial pressure of 3 mmHg.   Neuro/Psych Seizures -,  PSYCHIATRIC DISORDERS CVA    GI/Hepatic Neg liver ROS, GERD  Medicated and Controlled,  Endo/Other  diabetes, Oral Hypoglycemic Agents  Renal/GU negative Renal ROS     Musculoskeletal  (+) Arthritis ,   Abdominal   Peds  Hematology  (+) anemia ,   Anesthesia Other  Findings Severe Aortic Stenosis  Reproductive/Obstetrics                           Anesthesia Physical Anesthesia Plan  ASA: 4  Anesthesia Plan: MAC   Post-op Pain Management:    Induction: Intravenous  PONV Risk Score and Plan: 1 and Ondansetron, Dexamethasone and Treatment may vary due to age or medical condition  Airway Management Planned: Simple Face Mask  Additional Equipment: Arterial line  Intra-op Plan:   Post-operative Plan:   Informed Consent: I have reviewed the patients History and Physical, chart, labs and discussed the procedure including the risks, benefits and alternatives for the proposed anesthesia with the patient or authorized representative who has indicated his/her understanding and acceptance.     Dental advisory given  Plan Discussed with: CRNA  Anesthesia Plan Comments: (Reviewed PAT note written 07/04/2021 by Myra Gianotti, PA-C. )     Anesthesia Quick Evaluation

## 2021-07-04 NOTE — H&P (Signed)
[address]       [place] [address]             [phone]      Cardiothoracic Surgery Admission History and Physical   PCP is Default, Provider, MD  Referring Provider is Sherren Mocha, MD  Primary Cardiologist is Donnelly Angelica, MD ( previously Jordan Hawks, MD)   Reason for admission: Severe aortic stenosis   HPI:  The patient is a 66 year old gentleman with a history of type 2 diabetes, hyperlipidemia, hypertension, prior stroke, COPD, coronary disease status post CABG x4 in 1997, pulmonary embolism maintained on long-term anticoagulation with Xarelto, and aortic stenosis who is being evaluated for aortic valve replacement. He had an echocardiogram in April 2021 that showed mild aortic stenosis with a mean gradient of 11.5 mmHg and a peak gradient of 20 mmHg. Valve area at that time was 1.73 cm. The valve had an indeterminate number of cusps. He was hospitalized at [facility] in November 2022 with chest discomfort and left arm pain in the setting of tachyarrhythmia. Cardiac catheterization showed stable coronary anatomy with a patent LIMA to the LAD and saphenous vein graft to the obtuse marginal. A 2D echocardiogram on 04/24/2021 showed a calcified aortic valve with a mean gradient of 19.8 mmHg and a peak gradient of 36.7 mmHg. Aortic valve area was measured at 0.77 cm by VTI. Left ventricular ejection fraction was 45 to 50% with global hypokinesis. There is moderate LVH with grade 2 diastolic dysfunction. Stroke-volume index was 24. He reports progressive exertional fatigue and shortness of breath which is limiting his activity. He denies any peripheral edema. He denies orthopnea. He has history of syncopal spells in the past and history of seizure disorder. He had an implantable loop recorder that demonstrated no arrhythmic etiology of his syncope. He is no longer allowed to drive.  He is divorced and lives alone. He still has a good relationship with his  ex-wife. He said that he works in the [employer] at [employer] for 18 years and also worked in the [employer].      Past Medical History:  Diagnosis Date   CAD (coronary artery disease) of artery bypass graft    s/p CABG x 4 in 1997   COPD (chronic obstructive pulmonary disease) (HCC)    Coronary artery disease    CVA (cerebral vascular accident) ([place])    Diabetes mellitus without complication ([place])    GERD (gastroesophageal reflux disease)    History of single vessel coronary artery bypass    HLD (hyperlipidemia)    Hypertension    Seizures ([place])         Past Surgical History:  Procedure Laterality Date   BYPASS GRAFT ANGIOGRAPHY N/A 04/25/2021   Procedure: BYPASS GRAFT ANGIOGRAPHY; Surgeon: Andrez Grime, MD; Location: [place]; Service: Cardiovascular; Laterality: N/A;   CARDIAC SURGERY     CORNEAL TRANSPLANT Right    CORONARY ARTERY BYPASS GRAFT  1997   EYE SURGERY     FLEXOR TENDON REPAIR Left 07/11/2019   Procedure: FLEXOR tenolysis REPAIR LEFT RING FINGER with tednon repair; Surgeon: Hessie Knows, MD; Location: [facility]; Service: Orthopedics; Laterality: Left;   INCISION AND DRAINAGE ABSCESS Left 05/08/2019   Procedure: INCISION AND DRAINAGE ABSCESS; Surgeon: Dereck Leep, MD; Location: [facility]; Service: Orthopedics; Laterality: Left;   LEFT HEART CATH AND CORS/GRAFTS ANGIOGRAPHY N/A 10/02/2019   Procedure: LEFT HEART CATH AND CORS/GRAFTS ANGIOGRAPHY; Surgeon:  Leonie Man, MD; Location: [place] [facility]; Service: Cardiovascular; Laterality: N/A;   LOOP RECORDER INSERTION N/A 10/06/2019   Procedure: LOOP RECORDER INSERTION; Surgeon: Evans Lance, MD; Location: [place] [place] [facility]; Service: Cardiovascular; Laterality: N/A;        Family History  Problem Relation Age of Onset   Seizures Brother    Social History        Socioeconomic History   Marital status: Single    Spouse name: Not on file   Number of  children: 0   Years of education: Not on file   Highest education level: High school graduate  Occupational History   Occupation: Unemployed    Comment: Trying to get disability  Tobacco Use   Smoking status: Former    Types: Cigarettes   Smokeless tobacco: Never   Tobacco comments:    Quit 40 years ago  Vaping Use   Vaping Use: Never used  Substance and Sexual Activity   Alcohol use: Not Currently   Drug use: Never   Sexual activity: Never  Other Topics Concern   Not on file  Social History Narrative   ** Merged History Encounter **   Lives in an apartment on [address], has to climb 17 floors   Social Determinants of Health   Financial Resource Strain: Not on file  Food Insecurity: Not on file  Transportation Needs: Not on file  Physical Activity: Not on file  Stress: Not on file  Social Connections: Not on file  Intimate Partner Violence: Not on file          Prior to Admission medications   Medication Sig Start Date End Date Taking? Authorizing Provider  blood glucose meter kit and supplies KIT Dispense based on patient and insurance preference. Use up to four times daily as directed. (FOR ICD-9 250.00, 250.01). 02/19/20  Yes Iloabachie, Chioma E, NP  calcium citrate-vitamin D (CITRACAL+D) 315-200 MG-UNIT tablet Take 1 tablet by mouth 2 (two) times daily.   Yes [provider]  clopidogrel (PLAVIX) 75 MG tablet Take 1 tablet (75 mg total) by mouth daily. 04/28/21 04/28/22 Yes Jennye Boroughs, MD  divalproex (DEPAKOTE ER) 250 MG 24 hr tablet Take 250 mg by mouth daily. 01/26/21  Yes [provider]  divalproex (DEPAKOTE) 500 MG DR tablet Take 1 tablet (500 mg total) by mouth at bedtime. 04/27/21  Yes Jennye Boroughs, MD  glucose blood (PRECISION QID TEST) test strip 1 each (1 strip total) 2 (two) times daily Use as instructed. 03/07/21 03/07/22 Yes [provider]  ipratropium (ATROVENT) 0.06 % nasal spray Place 2 sprays into both nostrils 3 (three)  times daily. 12/13/20  Yes [provider]  isosorbide mononitrate (IMDUR) 30 MG 24 hr tablet Take 30 mg by mouth daily. 12/28/20  Yes [provider]  metoprolol succinate (TOPROL-XL) 100 MG 24 hr tablet Take 1 tablet (100 mg total) by mouth daily. 03/25/20  Yes Iloabachie, Chioma E, NP  Multiple Vitamin (MULTIVITAMIN) capsule Take 1 capsule by mouth daily.   Yes [provider]  nitroGLYCERIN (NITROSTAT) 0.4 MG SL tablet Place under the tongue. 01/27/21 10/05/22 Yes [provider]  ramipril (ALTACE) 2.5 MG capsule Take 1 capsule (2.5 mg total) by mouth daily. 04/28/21  Yes Jennye Boroughs, MD  TRUE METRIX BLOOD GLUCOSE TEST test strip SMARTSIG:Via Meter 03/07/21  Yes [provider]  TRULICITY 3.81 OF/7.5ZW SOPN Inject 0.75 mg into the skin once a week. Wednesday 02/19/20  Yes Iloabachie, Chioma  E, NP  XARELTO 20 MG TABS tablet Take 20 mg by mouth daily with supper. 12/28/20  Yes [provider]  atorvastatin (LIPITOR) 40 MG tablet TAKE ONE TABLET BY MOUTH AT BEDTIME 03/25/20 05/18/21  Iloabachie, Chioma E, NP  furosemide (LASIX) 20 MG tablet Take 1 tablet (20 mg total) by mouth daily. 04/28/21 05/28/21  Jennye Boroughs, MD  glipiZIDE (GLUCOTROL XL) 10 MG 24 hr tablet TAKE ONE TABLET BY MOUTH 2 TIMES A DAY 03/25/20 05/18/21  Iloabachie, Chioma E, NP  metFORMIN (GLUCOPHAGE-XR) 500 MG 24 hr tablet TAKE TWO TABLETS BY MOUTH 2 TIMES A DAY 03/25/20 05/18/21  Iloabachie, Chioma E, NP  omeprazole (PRILOSEC) 40 MG capsule TAKE ONE CAPSULE BY MOUTH EVERY DAY 03/25/20 05/18/21  Iloabachie, Chioma E, NP         Current Outpatient Medications  Medication Sig Dispense Refill   blood glucose meter kit and supplies KIT Dispense based on patient and insurance preference. Use up to four times daily as directed. (FOR ICD-9 250.00, 250.01). 1 each 0   calcium citrate-vitamin D (CITRACAL+D) 315-200 MG-UNIT tablet Take 1 tablet by mouth 2 (two) times daily.      clopidogrel (PLAVIX) 75 MG tablet Take 1 tablet (75 mg total) by mouth daily. 30 tablet 0   divalproex (DEPAKOTE ER) 250 MG 24 hr tablet Take 250 mg by mouth daily.     divalproex (DEPAKOTE) 500 MG DR tablet Take 1 tablet (500 mg total) by mouth at bedtime.     glucose blood (PRECISION QID TEST) test strip 1 each (1 strip total) 2 (two) times daily Use as instructed.     ipratropium (ATROVENT) 0.06 % nasal spray Place 2 sprays into both nostrils 3 (three) times daily.     isosorbide mononitrate (IMDUR) 30 MG 24 hr tablet Take 30 mg by mouth daily.     metoprolol succinate (TOPROL-XL) 100 MG 24 hr tablet Take 1 tablet (100 mg total) by mouth daily. 30 tablet 3   Multiple Vitamin (MULTIVITAMIN) capsule Take 1 capsule by mouth daily.     nitroGLYCERIN (NITROSTAT) 0.4 MG SL tablet Place under the tongue.     ramipril (ALTACE) 2.5 MG capsule Take 1 capsule (2.5 mg total) by mouth daily. 30 capsule 0   TRUE METRIX BLOOD GLUCOSE TEST test strip SMARTSIG:Via Meter     TRULICITY 7.09 GG/8.3MO SOPN Inject 0.75 mg into the skin once a week. Wednesday 1 mL 4   XARELTO 20 MG TABS tablet Take 20 mg by mouth daily with supper.     atorvastatin (LIPITOR) 40 MG tablet TAKE ONE TABLET BY MOUTH AT BEDTIME 30 tablet 3   furosemide (LASIX) 20 MG tablet Take 1 tablet (20 mg total) by mouth daily. 30 tablet 0   glipiZIDE (GLUCOTROL XL) 10 MG 24 hr tablet TAKE ONE TABLET BY MOUTH 2 TIMES A DAY 60 tablet 3   metFORMIN (GLUCOPHAGE-XR) 500 MG 24 hr tablet TAKE TWO TABLETS BY MOUTH 2 TIMES A DAY 120 tablet 3   omeprazole (PRILOSEC) 40 MG capsule TAKE ONE CAPSULE BY MOUTH EVERY DAY 30 capsule 3   No current facility-administered medications for this visit.   No Known Allergies  Review of Systems:  General: Normal appetite, + decreased energy, no weight gain, no weight loss, no fever  Cardiac: no chest pain with exertion, no chest pain at rest, +SOB with moderate exertion, no resting SOB, no PND, no orthopnea, no  palpitations, no arrhythmia, no atrial fibrillation, no LE edema, no  dizzy spells, + syncope  Respiratory: + exertional shortness of breath, no home oxygen, no productive cough, no dry cough, no bronchitis, no wheezing, no hemoptysis, no asthma, no pain with inspiration or cough, no sleep apnea, no CPAP at night  GI: no difficulty swallowing, no reflux, no frequent heartburn, no hiatal hernia, no abdominal pain, no constipation, no diarrhea, no hematochezia, no hematemesis, no melena  GU: no dysuria, no frequency, no urinary tract infection, no hematuria, no enlarged prostate, no kidney stones, no kidney disease  Vascular: no pain suggestive of claudication, no pain in feet, no leg cramps, no varicose veins, no DVT, no non-healing foot ulcer  Neuro: no stroke, no TIA's, no seizures, no headaches, no temporary blindness one eye, no slurred speech, no peripheral neuropathy, no chronic pain, no instability of gait, + memory/cognitive dysfunction  Musculoskeletal: no arthritis, no joint swelling, no myalgias, no difficulty walking, normal mobility  Skin: no rash, no itching, no skin infections, no pressure sores or ulcerations  Psych: no anxiety, no depression, no nervousness, no unusual recent stress  Eyes: no blurry vision, no floaters, no recent vision changes, + wears glasses ENT: + hearing loss, no loose or painful teeth, no dentures, sees dentist regularly  Hematologic: + easy bruising, no abnormal bleeding, no clotting disorder, no frequent epistaxis  Endocrine: + diabetes, does check CBG's at home  Physical Exam:  BP 122/68 (BP Location: Right Arm, Patient Position: Sitting)   Pulse 80   Resp 20   Ht $R'5\' 3"'[place]$  (1.6 m)   Wt 161 lb (73 kg)   SpO2 96% Comment: RA   BMI 28.52 kg/m  General: well-appearing  HEENT: Unremarkable, NCAT, PERLA, EOMI  Neck: no JVD, no bruits, no adenopathy  Chest: clear to auscultation, symmetrical breath sounds, no wheezes, no rhonchi  CV: RRR, 3/6 systolic murmur RSB, no  diastolic murmur  Abdomen: soft, non-tender, no masses  Extremities: warm, well-perfused, pulses palpable in ankles, no lower extremity edema  Rectal/GU Deferred  Neuro: Grossly non-focal and symmetrical throughout  Skin: Clean and dry, no rashes, no breakdown  Diagnostic Tests:  ECHOCARDIOGRAM REPORT     Patient Name: Thomas Moore Date of Exam: 04/24/2021  Medical Rec #: [id number] Height: 63.0 in  Accession #: [id number] Weight: 170.9 lb  Date of Birth: 07-14-1955 BSA: 1.808 m  Patient Age: 37 years BP: 111/67 mmHg  Patient Gender: M HR: 92 bpm.  Exam Location: [facility]   Procedure: 2D Echo   Indications: Cardiomyopathy I42.9   History: Patient has prior history of Echocardiogram examinations,  most  recent 09/30/2019.   Sonographer: Kathlen Brunswick RDCS  Referring Phys: [id number] BRIT[place] L R[place]  Diagnosing Phys: Ida Rogue MD   IMPRESSIONS    1. Left ventricular ejection fraction, by estimation, is 45 to 50%. The  left ventricle has mildly decreased function. The left ventricle  demonstrates global hypokinesis. There is moderate left ventricular  hypertrophy. Left ventricular diastolic  parameters are consistent with Grade II diastolic dysfunction  (pseudonormalization).  2. Right ventricular systolic function is normal. The right ventricular  size is normal. Tricuspid regurgitation signal is inadequate for assessing  PA pressure.  3. Left atrial size was mildly dilated.  4. The mitral valve is normal in structure. Mild mitral valve  regurgitation. No evidence of mitral stenosis.  5. The aortic valve is calcified. Aortic valve regurgitation is not  visualized. Moderate aortic valve stenosis. Aortic valve area, by VTI  measures 0.77 cm. Aortic valve mean gradient measures 19.8 mmHg.  Aortic  valve Vmax measures 3.03 m/s.  6. The inferior vena cava is normal in size with greater than 50%  respiratory variability, suggesting right atrial pressure of 3 mmHg.    FINDINGS  Left Ventricle: Left ventricular ejection fraction, by estimation, is 45  to 50%. The left ventricle has mildly decreased function. The left  ventricle demonstrates global hypokinesis. The left ventricular internal  cavity size was normal in size. There is  moderate left ventricular hypertrophy. Left ventricular diastolic  parameters are consistent with Grade II diastolic dysfunction  (pseudonormalization).   Right Ventricle: The right ventricular size is normal. No increase in  right ventricular wall thickness. Right ventricular systolic function is  normal. Tricuspid regurgitation signal is inadequate for assessing PA  pressure.   Left Atrium: Left atrial size was mildly dilated.   Right Atrium: Right atrial size was normal in size.   Pericardium: There is no evidence of pericardial effusion.   Mitral Valve: The mitral valve is normal in structure. Mild mitral annular  calcification. Mild mitral valve regurgitation. No evidence of mitral  valve stenosis.   Tricuspid Valve: The tricuspid valve is normal in structure. Tricuspid  valve regurgitation is not demonstrated. No evidence of tricuspid  stenosis.   Aortic Valve: The aortic valve is calcified. Aortic valve regurgitation is  not visualized. Moderate aortic stenosis is present. Aortic valve mean  gradient measures 19.8 mmHg. Aortic valve peak gradient measures 36.7  mmHg. Aortic valve area, by VTI  measures 0.77 cm.   Pulmonic Valve: The pulmonic valve was normal in structure. Pulmonic valve  regurgitation is not visualized. No evidence of pulmonic stenosis.   Aorta: The aortic root is normal in size and structure.   Venous: The inferior vena cava is normal in size with greater than 50%  respiratory variability, suggesting right atrial pressure of 3 mmHg.   IAS/Shunts: No atrial level shunt detected by color flow Doppler.    LEFT VENTRICLE  PLAX 2D  LVIDd: 5.70 cm Diastology  LVIDs: 4.50 cm LV e'  medial: 3.15 cm/s  LV PW: 1.30 cm LV E/e' medial: 34.3  LV IVS: 1.30 cm LV e' lateral: 2.39 cm/s  LVOT diam: 2.00 cm LV E/e' lateral: 45.2  LV SV: 44  LV SV Index: 24  LVOT Area: 3.14 cm   LV Volumes (MOD)  LV vol d, MOD A4C: 97.6 ml  LV vol s, MOD A4C: 48.2 ml  LV SV MOD A4C: 97.6 ml   RIGHT VENTRICLE  RV Basal diam: 2.50 cm  RV S prime: 11.60 cm/s  TAPSE (M-mode): 1.7 cm   LEFT ATRIUM Index RIGHT ATRIUM Index  LA diam: 3.50 cm 1.94 cm/m RA Area: 11.50 cm  LA Vol (A2C): 33.0 ml 18.25 ml/m RA Volume: 26.60 ml 14.71 ml/m  LA Vol (A4C): 37.2 ml 20.57 ml/m  LA Biplane Vol: 36.9 ml 20.40 ml/m  AORTIC VALVE PULMONIC VALVE  AV Area (Vmax): 0.76 cm PV Vmax: 0.92 m/s  AV Area (Vmean): 0.70 cm PV Peak grad: 3.4 mmHg  AV Area (VTI): 0.77 cm  AV Vmax: 303.00 cm/s  AV Vmean: 203.750 cm/s  AV VTI: 0.572 m  AV Peak Grad: 36.7 mmHg  AV Mean Grad: 19.8 mmHg  LVOT Vmax: 73.10 cm/s  LVOT Vmean: 45.300 cm/s  LVOT VTI: 0.140 m  LVOT/AV VTI ratio: 0.24   AORTA  Ao Root diam: 3.60 cm   MITRAL VALVE  MV Area (PHT): 3.46 cm SHUNTS  MV Decel Time: 219 msec Systemic VTI:  0.14 m  MV E velocity: 108.00 cm/s Systemic Diam: 2.00 cm  MV A velocity: 74.60 cm/s  MV E/A ratio: 1.45   Ida Rogue MD  Electronically signed by Ida Rogue MD  Signature Date/Time: 04/24/2021/12:49:49 PM     Final  Physicians  Panel Physicians Referring Physician Case Authorizing Physician  Andrez Grime, MD (Primary)  Andrez Grime, MD  Procedures  BYPASS GRAFT ANGIOGRAPHY  Conclusion   Prox LAD lesion is 55% stenosed.   Mid LAD lesion is 100% stenosed.   Prox RCA lesion is 90% stenosed.   Prox RCA to Mid RCA lesion is 100% stenosed.   Origin to Prox Graft lesion is 100% stenosed.   Ost Cx to Mid Cx lesion is 100% stenosed.   Mid Cx to Dist Cx lesion is 80% stenosed with 100% stenosed side branch in 2nd Mrg.   2nd Diag-1 lesion is 80% stenosed.   2nd Diag-2 lesion is 55%  stenosed.   1st Sept lesion is 70% stenosed.   RPDA lesion is 80% stenosed.   SVG.   Seq SVG- 1stMrg-2ndMrg and is large.   LIMA and is normal in caliber.   The graft exhibits severe .   The flow is not reversed.   There is no competitive flow  Conclusion:  Unchanged coronary anatomy compared to 2019. No culprit lesion for NSTEMI.  Severe 3 vessel CAD including CTO p RCA (not engaged), CTO prox Lcx, CTO mid LAD. There is diffuse disease leading to D1 which is unchanged.  SVG to RCA is occluded (known). SVG to OM1/2 is widely patent.  LIMA to LAD is widely patent Conclusion:  Medical management of CAD. Plan for Plavix and Xarelto x 12 months at discharge at which time plavix can be discontinued.  Tortuosity of the subclavian and radial artery made catheter manipulation difficult. Consider alternative access for future studies.  May need further evaluation of aortic stenosis and atrial arrhythmia that may have driven his presentation. Follow up with me in clinic in 1 week after discharge.  Recommendations  Antiplatelet/Anticoag Recommend to resume Rivaroxaban, at currently prescribed dose and frequency. Recommend concurrent antiplatelet therapy of Clopidogrel $RemoveBefore'75mg'gPLgmMoxVVIJq$  daily for 12 months.  Indications  NSTEMI (non-ST elevated myocardial infarction) ([place]) [I21.4 (ICD-10-CM)]  Procedural Details  Technical Details Cardiac Catheterization Procedure Note  Procedural details: The radial artery was prepped, draped, and anesthetized with 1% lidocaine. Using a micropuncture wire, a 80F slender sheath was placed in the left radial artery using the modified Seldinger technique. A versacore wire was needed to traverse tortuous anatomy in the radial artery. A guidewire was advanced into the ascending aorta at which time systemic heparin was administered. Please note, catheter manipulation was very difficult d/t subclavian artery tortuosity and radial tortuosity.  The SVG grafts and the LIMA were engaged  using a JR4, and the left coronary was engaged using a JL 3.5. There were no immediate complications and the patient was transferred to the postcatheterization recovery area for further monitoring.    Estimated blood loss <50 mL.   During this procedure medications were administered to achieve and maintain moderate conscious sedation while the patient's heart rate, blood pressure, and oxygen saturation were continuously monitored and I was present face-to-face 100% of this time.  Medications  (Filter: Administrations occurring from 0840 to 0926 on 04/25/21)  important Continuous medications are totaled by the amount administered until 04/25/21 0926.  Heparin (Porcine) in NaCl 1000-0.9 UT/500ML-% SOLN (mL)  Total volume: 1,000 mL  Date/Time  Rate/Dose/Volume Action   04/25/21 0843 500 mL Given   0843 500 mL Given   lidocaine (PF) (XYLOCAINE) 1 % injection (mL)  Total volume: 2 mL  Date/Time Rate/Dose/Volume Action   04/25/21 0848 2 mL Given   verapamil (ISOPTIN) injection (mg)  Total dose: 2.5 mg  Date/Time Rate/Dose/Volume Action   04/25/21 0855 2.5 mg Given   heparin sodium (porcine) injection (Units)  Total dose: 4,000 Units  Date/Time Rate/Dose/Volume Action   04/25/21 0902 3,000 Units Given   0903 1,000 Units Given   iohexol (OMNIPAQUE) 350 MG/ML injection (mL)  Total volume: 60 mL  Date/Time Rate/Dose/Volume Action   04/25/21 0924 60 mL Given   midazolam (VERSED) injection (mg)  Total dose: 1 mg  Date/Time Rate/Dose/Volume Action   04/25/21 0848 1 mg Given   fentaNYL (SUBLIMAZE) injection (mcg)  Total dose: 25 mcg  Date/Time Rate/Dose/Volume Action   04/25/21 0848 25 mcg Given   Sedation Time  Sedation Time Physician-1: 32 minutes 2 seconds  Contrast  Medication Name Total Dose  iohexol (OMNIPAQUE) 350 MG/ML injection 60 mL  Radiation/Fluoro  Fluoro time: 11.3 (min)  DAP: 30.5 (Gycm2)  Cumulative Air Kerma: 491 (mGy)  Complications     Complications documented  before study signed (04/25/2021 7:91 AM)    No complications were associated with this study.   Documented by Gildardo Pounds, RN - 04/25/2021 9:24 AM   Coronary Findings  Diagnostic  Dominance: Right  Left Main  Vessel is moderate in size.  Left Anterior Descending  Vessel is moderate in size.  Prox LAD lesion is 55% stenosed. The lesion is eccentric.  Mid LAD lesion is 100% stenosed. The lesion is chronically occluded.  First Diagonal Branch  Vessel is small in size.  First Septal Branch  Vessel is moderate in size. This is a major septal perforator  1st Sept lesion is 70% stenosed.  Second Diagonal Branch  Vessel is moderate in size. There is moderate disease in the vessel.  2nd Diag-1 lesion is 80% stenosed. The lesion is located at the bifurcation and concentric.  2nd Diag-2 lesion is 55% stenosed. The lesion is eccentric and irregular.  Second Septal Branch  Vessel is small in size.  Third Septal Branch  Vessel is small in size.  Left Circumflex  Vessel is moderate in size. The vessel originates from a separate ostium. Unable to engage  Ost Cx to Mid Cx lesion is 100% stenosed. The lesion is chronically occluded.  Mid Cx to Dist Cx lesion is 80% stenosed with 100% stenosed side branch in 2nd Mrg.  First Obtuse Marginal Branch  Vessel is small in size. There is mild disease in the vessel.  Second Obtuse Marginal Branch  Vessel is small in size. There is moderate disease in the vessel.  Left Posterior Atrioventricular Artery  Vessel is small in size. There is mild disease in the vessel.  Right Coronary Artery  Vessel is small.  Prox RCA lesion is 90% stenosed. The lesion is located proximal to the major branch, segmental and concentric.  Prox RCA to Mid RCA lesion is 100% stenosed. The lesion is chronically occluded.  Acute Marginal Branch  Vessel is small in size.  Right Ventricular Branch  Vessel is small in size.  Right Posterior Descending Artery  Vessel is small  in size.  RPDA lesion is 80% stenosed.  Right Posterior Atrioventricular Artery  Vessel is small in size.  Collaterals  RPAV filled by collaterals from Acute Mrg.    First  Right Posterolateral Branch  Vessel is small in size. Fills via collaterals from acute marginal branch this retrograde fills all the way up to the occlusion site.  Saphenous Graft To RPDA  SVG. The graft exhibits severe .  Origin to Prox Graft lesion is 100% stenosed. Vessel is the culprit lesion. The lesion is located proximal to the major branch. Proximal occlusion -> likely occurred at the time of his admission; no chance to open.  Sequential Jump Graft Graft To 1st Mrg, 2nd Mrg  Seq SVG- 1stMrg-2ndMrg and is large.  LIMA LIMA Graft To Mid LAD  LIMA and is normal in caliber. The flow is not reversed. There is no competitive flow  Intervention   No interventions have been documented.  Coronary Diagrams  Diagnostic  Dominance: Right  Intervention  ADDENDUM REPORT: 06/08/2021 19:13  CLINICAL DATA: Severe Aortic Stenosis.  EXAM:  Cardiac TAVR CT  TECHNIQUE:  A non-contrast, gated CT scan was obtained with axial slices of 3 mm  through the heart for aortic valve calcium scoring. A 130 kV  retrospective, gated, contrast cardiac scan was obtained. Gantry  rotation speed was 250 msecs and collimation was 0.6 mm.  Nitroglycerin was not given. The 3D data set was reconstructed in 5%  intervals of the 0-95% of the R-R cycle. Systolic and diastolic  phases were analyzed on a dedicated workstation using MPR, MIP, and  VRT modes. The patient received 100 cc of contrast.  FINDINGS:  Image quality: Excellent.  Noise artifact is: Limited.  Valve Morphology: The aortic valve tricuspid. The valve is diffusely  calcified. There is restricted motion in systole consistent with  severe aortic stenosis.  Aortic Valve Calcium score: 1781  Aortic annular dimension:  Phase assessed: 25%  Annular area: 464 mm2  Annular  perimeter: 79.4 mm  Max diameter: 29.0 mm  Min diameter: 21.6 mm  Annular and subannular calcification: Trace, single calcification  under the [place] that is layered.  Membranous septum: 3.3 mm  Optimal coplanar projection: RAO 19 CAU 2  Coronary Artery Height above Annulus:  Left Main: 14.0 mm  Right Coronary: 23.4 mm  Sinus of Valsalva Measurements:  Non-coronary: 38.5 mm  Right-coronary: 39.8 mm  Left-coronary: 40.2 mm  Sinus of Valsalva Height:  Non-coronary: 25.6 mm  Right-coronary: 27.1 mm  Left-coronary: 23.7 mm  Sinotubular Junction: 3.26 mm  Ascending Thoracic Aorta: 35.8 mm  Coronary Arteries: Normal coronary origin. Right dominance. The  study was performed without use of NTG and is insufficient for  plaque evaluation. Please refer to recent cardiac catheterization  for coronary assessment. LIMA-LAD is patent. SVG to an OM branch is  patent. SVG-RCA is occluded. Severe 3-vessel coronary calcifications  present of the native circulation.  Cardiac Morphology:  Right Atrium: Right atrial size is within normal limits. Contrast  reflux into the IVC consistent with elevated RA pressure.  Right Ventricle: The right ventricular cavity is within normal  limits.  Left Atrium: Left atrial size is normal in size. Contrast mixing  artifact is present in the LAA, cannot exclude thrombus.  Left Ventricle: The ventricular cavity size is within normal limits.  There are no stigmata of prior infarction. There is no abnormal  filling defect. Mildly reduced left ventricular function, LVEF=46%.  Global hypokinesis. Postoperative septal movement.  Pulmonary arteries: Normal in size without proximal filling defect.  Pulmonary veins: Normal pulmonary venous drainage.  Pericardium: Normal thickness with no significant effusion or  calcium present.  Mitral Valve: The mitral valve is normal  structure without  significant calcification.  Extra-cardiac findings: See attached radiology report  for  non-cardiac structures.  IMPRESSION:  1. Annular measurements appropriate for 26 mm S3 TAVR (464 mm2).  2. Trace, single calcification under the [place] that is layered.  3. Sufficient coronary to annulus distance.  4. Short membranous septum length (3.3 mm).  5. Optimal Fluoroscopic Angle for Delivery: RAO 19 CAU 2  6. Mildly reduced left ventricular function, LVEF=46%. Global  hypokinesis.  7. Contrast mixing artifact is present in the LAA, cannot exclude  thrombus.  8. Patent LIMA-LAD and SVG-OM. Occluded SVG-RCA.  [place] T. Audie Box, MD  Electronically Signed  By: Eleonore Chiquito M.D.  On: 06/08/2021 19:13   Addended by Geralynn Rile, MD on 06/08/2021 7:15 PM  Study Result  Narrative & Impression  EXAM:  OVER-READ INTERPRETATION CT CHEST  The following report is an over-read performed by radiologist Dr.  Yetta Glassman of [facility], [place] on 06/08/2021. This  over-read does not include interpretation of cardiac or coronary  anatomy or pathology. The coronary calcium score/coronary CTA  interpretation by the cardiologist is attached.  COMPARISON: None.  FINDINGS:  Extracardiac findings will be described separately under dictation  for contemporaneously obtained CTA chest, abdomen and pelvis.  IMPRESSION:  Please see separate dictation for contemporaneously obtained CTA  chest, abdomen and pelvis dated 06/08/2021 for full description of  relevant extracardiac findings.  Electronically Signed:  By: Yetta Glassman M.D.  On: 06/08/2021 12:32   Narrative & Impression  CLINICAL DATA: 66 year-old male, pre treatment planning for TAVR  EXAM:  CTA chest, ABDOMEN AND PELVIS WITHOUT AND WITH CONTRAST  TECHNIQUE:  Multidetector CT imaging of the chest, abdomen and pelvis was  performed using the standard protocol during bolus administration of  intravenous contrast. Multiplanar reconstructed images and MIPs were  obtained and reviewed to evaluate the vascular anatomy.   CONTRAST: 133m OMNIPAQUE IOHEXOL 350 MG/ML SOLN  COMPARISON: CT chest dated April 23, 2021  FINDINGS:  CTA CHEST FINDINGS  Cardiovascular: Cardiomegaly. No significant pericardial  effusion/thickening. Left main and three-vessel coronary artery  calcifications status post CABG. Patent vein graft to a diagonal  artery and patent LIMA to the LAD. Occluded RCA vein graft. Great  vessels are normal in course and caliber. No central pulmonary  emboli.  Mediastinum/Nodes: No discrete thyroid nodules. Patulous esophagus.  Enlarged mediastinal and hilar lymph nodes, slightly decreased in  size when compared with prior exam. Reference subcarinal lymph node  measuring 1.5 cm in short axis on series 12, image 57, previously  1.8 cm. And reference right hilar lymph node measuring 1.2 cm on  series 12, image 69.  Lungs/Pleura: No pneumothorax. No pleural effusion. Right greater  than left bibasilar atelectasis. No consolidation.  Musculoskeletal: No aggressive appearing focal osseous lesions.  Median sternotomy wires are intact.  CTA ABDOMEN AND PELVIS FINDINGS  Hepatobiliary: Normal liver with no liver mass. Normal gallbladder  with no radiopaque cholelithiasis. No biliary ductal dilatation.  Pancreas: Normal, with no mass or duct dilation.  Spleen: Normal size. No mass.  Adrenals/Urinary Tract: Normal adrenals. Kidneys enhance  symmetrically with no evidence of hydronephrosis. Normal bladder.  Stomach/Bowel: Normal non-distended stomach. Normal caliber small  bowel with no small bowel wall thickening. Normal appendix. Normal  large bowel with no diverticulosis, large bowel wall thickening or  pericolonic fat stranding.  Vascular/Lymphatic: Normal caliber abdominal aorta with calcified  and noncalcified plaque. Major branch vessels are patent. Two  bilateral renal arteries with mild narrowing  at the origins due to  calcified and noncalcified plaque. No pathologically enlarged lymph   nodes in the abdomen or pelvis.  Reproductive: Prostatomegaly, measuring up to 5.2 cm.  Other: No pneumoperitoneum, ascites or focal fluid collection.  Musculoskeletal: No aggressive appearing focal osseous lesions. Mild  superior endplate deformity of L5 with associated Schmorl's node.  VASCULAR MEASUREMENTS PERTINENT TO TAVR:  AORTA:  Minimal Aortic Diameter-9.3 mm  Severity of Aortic Calcification-moderate to severe  RIGHT PELVIS:  Right Common Iliac Artery -  Minimal Diameter-4.6 mm  Tortuosity-none  Calcification-moderate  Right External Iliac Artery -  Minimal Diameter-6.4 mm  Tortuosity-mild  Calcification-mild  Right Common Femoral Artery -  Minimal Diameter-5.2 mm  Tortuosity-none  Calcification-mild  LEFT PELVIS:  Left Common Iliac Artery -  Minimal Diameter-5.8 mm  Tortuosity-none  Calcification-moderate  Left External Iliac Artery -  Minimal Diameter-8.1 mm  Tortuosity-none  Calcification-mild  Left Common Femoral Artery -  Minimal Diameter-5.7 mm  Tortuosity-none  Calcification-mild  Review of the MIP images confirms the above findings.  IMPRESSION:  1. Vascular findings and measurements pertinent to potential TAVR  procedure, as detailed above.  2. Severe thickening calcification of the aortic valve, compatible  with reported clinical history of severe aortic stenosis.  3. 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 Left main and 3  vessel coronary artery disease status post CABG.  4. Enlarged mediastinal and hilar lymph nodes are stable to slightly  decreased in size when compared with prior exam and likely reactive.  Electronically Signed  By: Yetta Glassman M.D.  On: 06/08/2021 13:49   Impression:   This 66 year old gentleman has stage D2, severe, symptomatic, low-flow/low gradient aortic stenosis with New York Heart Association class II symptoms of exertional fatigue and shortness of breath consistent with chronic diastolic congestive heart  failure. He presented in November with a non-ST segment elevation MI in the setting of tachyarrhythmia with acute respiratory failure requiring intubation. I have personally reviewed his 2D echocardiogram, cardiac catheterization, and CTA studies. His echo in November 2022 showed a calcified and thickened aortic valve with restricted leaflet mobility. The mean gradient was 22 mmHg with a peak gradient of 41 mmHg. Valve area was 0.8 cm with a dimensionless index of 0.25 and a low stroke-volume index of 24. His valve certainly looks like a severely stenotic valve. I agree that aortic valve replacement is indicated in this patient for relief of his symptoms and to prevent progressive left ventricular deterioration. With prior coronary bypass graft surgery in addition to multiple other comorbidities I think that transcatheter aortic valve replacement is the best option for treating him. His gated cardiac CTA shows anatomy suitable for TAVR using a SAPIEN 3 valve. His abdominal and pelvic CTA shows bilateral common iliac artery calcific atherosclerosis but I think there is adequate lumen to allow transfemoral insertion.  The patient was counseled at length regarding treatment alternatives for management of severe symptomatic aortic stenosis. The risks and benefits of surgical intervention has been discussed in detail. Long-term prognosis with medical therapy was discussed. Alternative approaches such as conventional surgical aortic valve replacement, transcatheter aortic valve replacement, and palliative medical therapy were compared and contrasted at length. This discussion was placed in the context of the patient's own specific clinical presentation and past medical history. All of his questions have been addressed.  Following the decision to proceed with transcatheter aortic valve replacement, a discussion was held regarding what types of management strategies would be attempted intraoperatively in the event of  life-threatening complications,  including whether or not the patient would be considered a candidate for the use of cardiopulmonary bypass and/or conversion to open sternotomy for attempted surgical intervention. With prior coronary bypass graft surgery I do not think he would be a candidate for emergent sternotomy to manage any intraoperative complications. The patient is aware of the fact that transient use of cardiopulmonary bypass may be necessary. The patient has been advised of a variety of complications that might develop including but not limited to risks of death, stroke, paravalvular leak, aortic dissection or other major vascular complications, aortic annulus rupture, device embolization, cardiac rupture or perforation, mitral regurgitation, acute myocardial infarction, arrhythmia, heart block or bradycardia requiring permanent pacemaker placement, congestive heart failure, respiratory failure, renal failure, pneumonia, infection, other late complications related to structural valve deterioration or migration, or other complications that might ultimately cause a temporary or permanent loss of functional independence or other long term morbidity. The patient provides full informed consent for the procedure as described and all questions were answered.   Plan:   Transfemoral TAVR  using a SAPIEN 3 valve.   Gaye Pollack, MD

## 2021-07-05 ENCOUNTER — Inpatient Hospital Stay (HOSPITAL_COMMUNITY): Payer: Medicare HMO | Admitting: Vascular Surgery

## 2021-07-05 ENCOUNTER — Encounter (HOSPITAL_COMMUNITY)
Admission: RE | Disposition: A | Discharge status: Home / Self Care | Payer: Medicare HMO | Source: Home / Self Care | Attending: Cardiovascular Disease

## 2021-07-05 ENCOUNTER — Other Ambulatory Visit: Payer: Self-pay

## 2021-07-05 ENCOUNTER — Inpatient Hospital Stay (HOSPITAL_COMMUNITY)
Admission: RE | Admit: 2021-07-05 | Discharge: 2021-07-07 | DRG: 267 | Disposition: A | Discharge status: Home / Self Care | Payer: Medicare HMO | Attending: Cardiovascular Disease | Admitting: Cardiovascular Disease

## 2021-07-05 ENCOUNTER — Other Ambulatory Visit: Payer: Self-pay | Admitting: Cardiology

## 2021-07-05 ENCOUNTER — Encounter (HOSPITAL_COMMUNITY): Payer: Self-pay | Admitting: Cardiovascular Disease

## 2021-07-05 ENCOUNTER — Inpatient Hospital Stay (HOSPITAL_COMMUNITY): Payer: Medicare HMO | Admitting: Anesthesiology

## 2021-07-05 ENCOUNTER — Inpatient Hospital Stay (HOSPITAL_COMMUNITY): Payer: Medicare HMO

## 2021-07-05 DIAGNOSIS — I350 Nonrheumatic aortic (valve) stenosis: Secondary | ICD-10-CM

## 2021-07-05 DIAGNOSIS — Z86711 Personal history of pulmonary embolism: Secondary | ICD-10-CM | POA: Diagnosis not present

## 2021-07-05 DIAGNOSIS — Z87891 Personal history of nicotine dependence: Secondary | ICD-10-CM

## 2021-07-05 DIAGNOSIS — E785 Hyperlipidemia, unspecified: Secondary | ICD-10-CM | POA: Diagnosis not present

## 2021-07-05 DIAGNOSIS — J449 Chronic obstructive pulmonary disease, unspecified: Secondary | ICD-10-CM | POA: Diagnosis present

## 2021-07-05 DIAGNOSIS — Z7901 Long term (current) use of anticoagulants: Secondary | ICD-10-CM

## 2021-07-05 DIAGNOSIS — G40909 Epilepsy, unspecified, not intractable, without status epilepticus: Secondary | ICD-10-CM | POA: Diagnosis not present

## 2021-07-05 DIAGNOSIS — Z8673 Personal history of transient ischemic attack (TIA), and cerebral infarction without residual deficits: Secondary | ICD-10-CM

## 2021-07-05 DIAGNOSIS — Z952 Presence of prosthetic heart valve: Secondary | ICD-10-CM | POA: Diagnosis not present

## 2021-07-05 DIAGNOSIS — E1122 Type 2 diabetes mellitus with diabetic chronic kidney disease: Secondary | ICD-10-CM

## 2021-07-05 DIAGNOSIS — I519 Heart disease, unspecified: Secondary | ICD-10-CM

## 2021-07-05 DIAGNOSIS — E1165 Type 2 diabetes mellitus with hyperglycemia: Secondary | ICD-10-CM

## 2021-07-05 DIAGNOSIS — I25810 Atherosclerosis of coronary artery bypass graft(s) without angina pectoris: Secondary | ICD-10-CM | POA: Diagnosis present

## 2021-07-05 DIAGNOSIS — Z006 Encounter for examination for normal comparison and control in clinical research program: Secondary | ICD-10-CM

## 2021-07-05 DIAGNOSIS — I5022 Chronic systolic (congestive) heart failure: Secondary | ICD-10-CM | POA: Diagnosis present

## 2021-07-05 DIAGNOSIS — N183 Chronic kidney disease, stage 3 unspecified: Secondary | ICD-10-CM

## 2021-07-05 DIAGNOSIS — E119 Type 2 diabetes mellitus without complications: Secondary | ICD-10-CM

## 2021-07-05 DIAGNOSIS — I110 Hypertensive heart disease with heart failure: Secondary | ICD-10-CM | POA: Diagnosis not present

## 2021-07-05 DIAGNOSIS — I252 Old myocardial infarction: Secondary | ICD-10-CM

## 2021-07-05 DIAGNOSIS — K219 Gastro-esophageal reflux disease without esophagitis: Secondary | ICD-10-CM | POA: Diagnosis present

## 2021-07-05 DIAGNOSIS — R000 Tachycardia, unspecified: Secondary | ICD-10-CM | POA: Diagnosis present

## 2021-07-05 DIAGNOSIS — I2510 Atherosclerotic heart disease of native coronary artery without angina pectoris: Secondary | ICD-10-CM

## 2021-07-05 DIAGNOSIS — I2699 Other pulmonary embolism without acute cor pulmonale: Secondary | ICD-10-CM

## 2021-07-05 DIAGNOSIS — I10 Essential (primary) hypertension: Secondary | ICD-10-CM | POA: Diagnosis not present

## 2021-07-05 DIAGNOSIS — I214 Non-ST elevation (NSTEMI) myocardial infarction: Secondary | ICD-10-CM | POA: Diagnosis present

## 2021-07-05 HISTORY — PX: TRANSCATHETER AORTIC VALVE REPLACEMENT, TRANSFEMORAL: SHX6400

## 2021-07-05 HISTORY — DX: Presence of prosthetic heart valve: Z95.2

## 2021-07-05 HISTORY — PX: INTRAOPERATIVE TRANSTHORACIC ECHOCARDIOGRAM: SHX6523

## 2021-07-05 LAB — POCT I-STAT, CHEM 8
BUN: 13 mg/dL (ref 8–23)
BUN: 13 mg/dL (ref 8–23)
Calcium, Ion: 1.23 mmol/L (ref 1.15–1.40)
Calcium, Ion: 1.24 mmol/L (ref 1.15–1.40)
Chloride: 101 mmol/L (ref 98–111)
Chloride: 104 mmol/L (ref 98–111)
Creatinine, Ser: 0.8 mg/dL (ref 0.61–1.24)
Creatinine, Ser: 0.8 mg/dL (ref 0.61–1.24)
Glucose, Bld: 183 mg/dL — ABNORMAL HIGH (ref 70–99)
Glucose, Bld: 138 mg/dL — ABNORMAL HIGH (ref 70–99)
HCT: 33 % — ABNORMAL LOW (ref 39.0–52.0)
HCT: 30 % — ABNORMAL LOW (ref 39.0–52.0)
Hemoglobin: 11.2 g/dL — ABNORMAL LOW (ref 13.0–17.0)
Hemoglobin: 10.2 g/dL — ABNORMAL LOW (ref 13.0–17.0)
Potassium: 4.3 mmol/L (ref 3.5–5.1)
Potassium: 4.6 mmol/L (ref 3.5–5.1)
Sodium: 139 mmol/L (ref 135–145)
Sodium: 139 mmol/L (ref 135–145)
TCO2: 26 mmol/L (ref 22–32)
TCO2: 25 mmol/L (ref 22–32)

## 2021-07-05 LAB — GLUCOSE, CAPILLARY
Glucose-Capillary: 122 mg/dL — ABNORMAL HIGH (ref 70–99)
Glucose-Capillary: 122 mg/dL — ABNORMAL HIGH (ref 70–99)
Glucose-Capillary: 156 mg/dL — ABNORMAL HIGH (ref 70–99)

## 2021-07-05 LAB — ECHOCARDIOGRAM LIMITED
AR max vel: 4.3 cm2
AV Area VTI: 4.09 cm2
AV Area mean vel: 4.32 cm2
AV Mean grad: 1 mmHg
AV Peak grad: 3.3 mmHg
Ao pk vel: 0.91 m/s

## 2021-07-05 LAB — ABO/RH: ABO/RH(D): O POS

## 2021-07-05 SURGERY — IMPLANTATION, AORTIC VALVE, TRANSCATHETER, FEMORAL APPROACH
Anesthesia: Monitor Anesthesia Care | Site: Chest

## 2021-07-05 MED ORDER — CHLORHEXIDINE GLUCONATE 4 % EX LIQD: 60.0000 mL | Freq: Once | CUTANEOUS | Status: DC

## 2021-07-05 MED ORDER — FENTANYL CITRATE (PF) 250 MCG/5ML IJ SOLN
INTRAMUSCULAR | Status: AC
  Filled 2021-07-05: qty 5

## 2021-07-05 MED ORDER — CHLORHEXIDINE GLUCONATE 0.12 % MT SOLN: 15.0000 mL | Freq: Once | OROMUCOSAL | Status: DC

## 2021-07-05 MED ORDER — SODIUM CHLORIDE 0.9 % IV SOLN: INTRAVENOUS | Status: DC

## 2021-07-05 MED ORDER — MIDAZOLAM HCL 2 MG/2ML IJ SOLN
INTRAMUSCULAR | Status: DC | PRN
Start: 2021-07-05 — End: 2021-07-05
  Administered 2021-07-05: 1 mg via INTRAVENOUS

## 2021-07-05 MED ORDER — HEPARIN SODIUM (PORCINE) 1000 UNIT/ML IJ SOLN
INTRAMUSCULAR | Status: DC | PRN
  Administered 2021-07-05: 11000 [IU] via INTRAVENOUS

## 2021-07-05 MED ORDER — LIDOCAINE HCL 1 % IJ SOLN
INTRAMUSCULAR | Status: AC
  Filled 2021-07-05: qty 20

## 2021-07-05 MED ORDER — CHLORHEXIDINE GLUCONATE 0.12 % MT SOLN
OROMUCOSAL | Status: AC
  Administered 2021-07-05: 15 mL via OROMUCOSAL
  Filled 2021-07-05: qty 15

## 2021-07-05 MED ORDER — NITROGLYCERIN IN D5W 200-5 MCG/ML-% IV SOLN: 0.0000 ug/min | INTRAVENOUS | Status: DC

## 2021-07-05 MED ORDER — LIDOCAINE HCL 1 % IJ SOLN
INTRAMUSCULAR | Status: DC | PRN
  Administered 2021-07-05: 5 mL

## 2021-07-05 MED ORDER — LACTATED RINGERS IV SOLN
INTRAVENOUS | Status: DC | PRN
Start: 2021-07-05 — End: 2021-07-05

## 2021-07-05 MED ORDER — SODIUM CHLORIDE 0.9 % IV SOLN: INTRAVENOUS | Status: AC

## 2021-07-05 MED ORDER — SODIUM CHLORIDE 0.9% FLUSH
3.0000 mL | Freq: Two times a day (BID) | INTRAVENOUS | Status: DC
  Administered 2021-07-06 – 2021-07-07 (×3): 3 mL via INTRAVENOUS

## 2021-07-05 MED ORDER — ACETAMINOPHEN 650 MG RE SUPP: 650.0000 mg | Freq: Four times a day (QID) | RECTAL | Status: DC | PRN

## 2021-07-05 MED ORDER — PROPOFOL 500 MG/50ML IV EMUL
INTRAVENOUS | Status: DC | PRN
  Administered 2021-07-05: 20 ug/kg/min via INTRAVENOUS

## 2021-07-05 MED ORDER — CHLORHEXIDINE GLUCONATE 4 % EX LIQD: 30.0000 mL | CUTANEOUS | Status: DC

## 2021-07-05 MED ORDER — OXYCODONE HCL 5 MG PO TABS: 5.0000 mg | ORAL_TABLET | ORAL | Status: DC | PRN

## 2021-07-05 MED ORDER — ATORVASTATIN CALCIUM 40 MG PO TABS
40.0000 mg | ORAL_TABLET | Freq: Every day | ORAL | Status: DC
Start: 2021-07-05 — End: 2021-07-07
  Administered 2021-07-05 – 2021-07-06 (×2): 40 mg via ORAL
  Filled 2021-07-05 (×2): qty 1

## 2021-07-05 MED ORDER — MORPHINE SULFATE (PF) 2 MG/ML IV SOLN: 1.0000 mg | INTRAVENOUS | Status: DC | PRN

## 2021-07-05 MED ORDER — VANCOMYCIN HCL IN DEXTROSE 1-5 GM/200ML-% IV SOLN
1000.0000 mg | Freq: Once | INTRAVENOUS | Status: AC
  Administered 2021-07-05: 1000 mg via INTRAVENOUS
  Filled 2021-07-05: qty 200

## 2021-07-05 MED ORDER — MIDAZOLAM HCL 2 MG/2ML IJ SOLN
INTRAMUSCULAR | Status: AC
  Filled 2021-07-05: qty 2

## 2021-07-05 MED ORDER — CHLORHEXIDINE GLUCONATE 0.12 % MT SOLN: 15.0000 mL | Freq: Once | OROMUCOSAL | Status: AC

## 2021-07-05 MED ORDER — METOPROLOL TARTRATE 5 MG/5ML IV SOLN
INTRAVENOUS | Status: AC
  Administered 2021-07-05: 2.5 mg via INTRAVENOUS
  Filled 2021-07-05: qty 5

## 2021-07-05 MED ORDER — TRAMADOL HCL 50 MG PO TABS: 50.0000 mg | ORAL_TABLET | ORAL | Status: DC | PRN

## 2021-07-05 MED ORDER — SODIUM CHLORIDE 0.9 % IV SOLN: 250.0000 mL | INTRAVENOUS | Status: DC

## 2021-07-05 MED ORDER — SODIUM CHLORIDE 0.9% FLUSH: 3.0000 mL | INTRAVENOUS | Status: DC | PRN

## 2021-07-05 MED ORDER — ACETAMINOPHEN 325 MG PO TABS: 650.0000 mg | ORAL_TABLET | Freq: Four times a day (QID) | ORAL | Status: DC | PRN

## 2021-07-05 MED ORDER — ONDANSETRON HCL 4 MG/2ML IJ SOLN
INTRAMUSCULAR | Status: DC | PRN
  Administered 2021-07-05: 4 mg via INTRAVENOUS

## 2021-07-05 MED ORDER — METOPROLOL TARTRATE 5 MG/5ML IV SOLN: 2.5000 mg | INTRAVENOUS | Status: AC

## 2021-07-05 MED ORDER — IODIXANOL 320 MG/ML IV SOLN
INTRAVENOUS | Status: DC | PRN
  Administered 2021-07-05: 40 mL via INTRA_ARTERIAL

## 2021-07-05 MED ORDER — INSULIN ASPART 100 UNIT/ML IJ SOLN
0.0000 [IU] | INTRAMUSCULAR | Status: DC
  Administered 2021-07-05 (×2): 2 [IU] via SUBCUTANEOUS
  Administered 2021-07-06: 4 [IU] via SUBCUTANEOUS
  Administered 2021-07-06 (×2): 2 [IU] via SUBCUTANEOUS
  Administered 2021-07-06: 12 [IU] via SUBCUTANEOUS
  Administered 2021-07-06: 8 [IU] via SUBCUTANEOUS
  Administered 2021-07-06 – 2021-07-07 (×3): 2 [IU] via SUBCUTANEOUS

## 2021-07-05 MED ORDER — CLOPIDOGREL BISULFATE 75 MG PO TABS
75.0000 mg | ORAL_TABLET | Freq: Every day | ORAL | Status: DC
Start: 2021-07-06 — End: 2021-07-06
  Administered 2021-07-06: 75 mg via ORAL
  Filled 2021-07-05: qty 1

## 2021-07-05 MED ORDER — METOPROLOL TARTRATE 5 MG/5ML IV SOLN
2.5000 mg | Freq: Once | INTRAVENOUS | Status: AC
  Administered 2021-07-05: 2.5 mg via INTRAVENOUS

## 2021-07-05 MED ORDER — PROPOFOL 10 MG/ML IV BOLUS
INTRAVENOUS | Status: DC | PRN
Start: 2021-07-05 — End: 2021-07-05
  Administered 2021-07-05: 10 mg via INTRAVENOUS

## 2021-07-05 MED ORDER — HEPARIN 6000 UNIT IRRIGATION SOLUTION
Status: DC | PRN
  Administered 2021-07-05: 3

## 2021-07-05 MED ORDER — ORAL CARE MOUTH RINSE: 15.0000 mL | Freq: Once | OROMUCOSAL | Status: AC

## 2021-07-05 MED ORDER — ONDANSETRON HCL 4 MG/2ML IJ SOLN: 4.0000 mg | Freq: Four times a day (QID) | INTRAMUSCULAR | Status: DC | PRN

## 2021-07-05 MED ORDER — HEPARIN 6000 UNIT IRRIGATION SOLUTION
Status: AC
  Filled 2021-07-05: qty 1500

## 2021-07-05 MED ORDER — PROTAMINE SULFATE 10 MG/ML IV SOLN
INTRAVENOUS | Status: DC | PRN
  Administered 2021-07-05: 110 mg via INTRAVENOUS

## 2021-07-05 MED ORDER — SODIUM CHLORIDE 0.9 % IV SOLN: 250.0000 mL | INTRAVENOUS | Status: DC | PRN

## 2021-07-05 MED ORDER — LACTATED RINGERS IV SOLN: INTRAVENOUS | Status: DC

## 2021-07-05 MED ORDER — DIVALPROEX SODIUM ER 250 MG PO TB24
250.0000 mg | ORAL_TABLET | Freq: Two times a day (BID) | ORAL | Status: DC
Start: 2021-07-05 — End: 2021-07-07
  Administered 2021-07-06 – 2021-07-07 (×3): 250 mg via ORAL
  Filled 2021-07-05 (×5): qty 1

## 2021-07-05 SURGICAL SUPPLY — 64 items
ADH SKN CLS APL DERMABOND .7 (GAUZE/BANDAGES/DRESSINGS) ×2
APL PRP STRL LF DISP 70% ISPRP (MISCELLANEOUS) ×2
BAG COUNTER SPONGE SURGICOUNT (BAG) ×4 IMPLANT
BAG DECANTER FOR FLEXI CONT (MISCELLANEOUS) IMPLANT
BAG SPNG CNTER NS LX DISP (BAG) ×2
BLADE CLIPPER SURG (BLADE) IMPLANT
BLADE STERNUM SYSTEM 6 (BLADE) IMPLANT
CABLE ADAPT CONN TEMP 6FT (ADAPTER) ×4 IMPLANT
CANISTER SUCT 3000ML PPV (MISCELLANEOUS) IMPLANT
CATH DIAG EXPO 6F AL1 (CATHETERS) IMPLANT
CATH DIAG EXPO 6F VENT PIG 145 (CATHETERS) ×8 IMPLANT
CATH INFINITI 6F AL2 (CATHETERS) IMPLANT
CATH S G BIP PACING (CATHETERS) ×4 IMPLANT
CHLORAPREP W/TINT 26 (MISCELLANEOUS) ×4 IMPLANT
CLOSURE MYNX CONTROL 6F/7F (Vascular Products) ×1 IMPLANT
CNTNR URN SCR LID CUP LEK RST (MISCELLANEOUS) ×6 IMPLANT
CONT SPEC 4OZ STRL OR WHT (MISCELLANEOUS) ×6
COVER BACK TABLE 80X110 HD (DRAPES) IMPLANT
DECANTER SPIKE VIAL GLASS SM (MISCELLANEOUS) ×4 IMPLANT
DERMABOND ADVANCED (GAUZE/BANDAGES/DRESSINGS) ×1
DERMABOND ADVANCED .7 DNX12 (GAUZE/BANDAGES/DRESSINGS) ×3 IMPLANT
DEVICE CLOSURE PERCLS PRGLD 6F (VASCULAR PRODUCTS) ×6 IMPLANT
DRSG TEGADERM 4X4.75 (GAUZE/BANDAGES/DRESSINGS) ×8 IMPLANT
ELECT REM PT RETURN 9FT ADLT (ELECTROSURGICAL) ×3
ELECTRODE REM PT RTRN 9FT ADLT (ELECTROSURGICAL) ×3 IMPLANT
GAUZE 4X4 16PLY ~~LOC~~+RFID DBL (SPONGE) ×1 IMPLANT
GAUZE SPONGE 4X4 12PLY STRL (GAUZE/BANDAGES/DRESSINGS) ×4 IMPLANT
GLOVE SURG ENC MOIS LTX SZ7.5 (GLOVE) IMPLANT
GLOVE SURG ENC MOIS LTX SZ8 (GLOVE) IMPLANT
GLOVE SURG ORTHO LTX SZ7.5 (GLOVE) IMPLANT
GOWN STRL REUS W/ TWL LRG LVL3 (GOWN DISPOSABLE) IMPLANT
GOWN STRL REUS W/ TWL XL LVL3 (GOWN DISPOSABLE) ×3 IMPLANT
GOWN STRL REUS W/TWL LRG LVL3 (GOWN DISPOSABLE)
GOWN STRL REUS W/TWL XL LVL3 (GOWN DISPOSABLE) ×3
GUIDEWIRE SAF TJ AMPL .035X180 (WIRE) ×4 IMPLANT
GUIDEWIRE SAFE TJ AMPLATZ EXST (WIRE) ×4 IMPLANT
KIT BASIN OR (CUSTOM PROCEDURE TRAY) ×4 IMPLANT
KIT HEART LEFT (KITS) ×4 IMPLANT
KIT SAPIAN 3 ULTRA RESILIA 26 (Valve) ×1 IMPLANT
KIT TURNOVER KIT B (KITS) ×4 IMPLANT
NS IRRIG 1000ML POUR BTL (IV SOLUTION) ×4 IMPLANT
PACK ENDO MINOR (CUSTOM PROCEDURE TRAY) ×4 IMPLANT
PAD ARMBOARD 7.5X6 YLW CONV (MISCELLANEOUS) ×8 IMPLANT
PAD ELECT DEFIB RADIOL ZOLL (MISCELLANEOUS) ×4 IMPLANT
PERCLOSE PROGLIDE 6F (VASCULAR PRODUCTS) ×6
POSITIONER HEAD DONUT 9IN (MISCELLANEOUS) ×4 IMPLANT
SET MICROPUNCTURE 5F STIFF (MISCELLANEOUS) ×4 IMPLANT
SHEATH BRITE TIP 7FR 35CM (SHEATH) ×4 IMPLANT
SHEATH PINNACLE 6F 10CM (SHEATH) ×4 IMPLANT
SHEATH PINNACLE 8F 10CM (SHEATH) ×4 IMPLANT
SLEEVE REPOSITIONING LENGTH 30 (MISCELLANEOUS) ×4 IMPLANT
SPONGE T-LAP 18X18 ~~LOC~~+RFID (SPONGE) ×1 IMPLANT
STOPCOCK MORSE 400PSI 3WAY (MISCELLANEOUS) ×8 IMPLANT
SUT PROLENE 6 0 C 1 30 (SUTURE) IMPLANT
SUT SILK  1 MH (SUTURE) ×3
SUT SILK 1 MH (SUTURE) ×3 IMPLANT
SYR 50ML LL SCALE MARK (SYRINGE) ×4 IMPLANT
SYR BULB IRRIG 60ML STRL (SYRINGE) IMPLANT
TOWEL GREEN STERILE (TOWEL DISPOSABLE) ×8 IMPLANT
TRANSDUCER W/STOPCOCK (MISCELLANEOUS) ×8 IMPLANT
TRAY FOLEY SLVR 14FR TEMP STAT (SET/KITS/TRAYS/PACK) IMPLANT
TUBE SUCT INTRACARD DLP 20F (MISCELLANEOUS) IMPLANT
WIRE EMERALD 3MM-J .035X150CM (WIRE) ×4 IMPLANT
WIRE EMERALD 3MM-J .035X260CM (WIRE) ×4 IMPLANT

## 2021-07-05 NOTE — Progress Notes (Signed)
Mobility Specialist: Progress Note   07/05/21 1829  Mobility  Activity Ambulated with assistance in hallway  Level of Assistance Modified independent, requires aide device or extra time  Assistive Device Front wheel walker  Distance Ambulated (ft) 320 ft  Activity Response Tolerated well  $Mobility charge 1 Mobility   Pre-Mobility: 82 HR, 98% SpO2 Post-Mobility: 101 HR, 153/81 BP, 96% SpO2  Received pt in bed having no complaints and agreeable to mobility. Asymptomatic throughout ambulation, returned back to bed w/ call bell in reach and all needs met. No bleeding noted.   [facility] Shaun Zuccaro Mobility Specialist Mobility Specialist 4 [place]: [phone] Mobility Specialist 2 [place]: [phone]

## 2021-07-05 NOTE — Progress Notes (Signed)
°  [place] VALVE TEAM  Patient doing well s/p TAVR. He is hemodynamically stable. Groin sites stable. ECG with no high grade block. Arterial line discontinued and transferred to 4E. Plan for early ambulation after bedrest completed and hopeful discharge over the next 24-48 hours however there is concern for the patient to go home tomorrow as he has no one to stay with him overnight.   Kathyrn Drown NP-C Structural Heart Team  Pager: [phone] Phone: [phone]

## 2021-07-05 NOTE — Anesthesia Procedure Notes (Signed)
Procedure Name: MAC Date/Time: 07/05/2021 12:03 PM Performed by: Lorie Phenix, CRNA Pre-anesthesia Checklist: Patient identified, Emergency Drugs available, Suction available and Patient being monitored Oxygen Delivery Method: Simple face mask Placement Confirmation: positive ETCO2

## 2021-07-05 NOTE — Progress Notes (Signed)
Pt arrived to rm 4e1 from cath lab. CHG wipe given. Initiated tele. Oriented pt to the unit. VSS. Call bell within reach.   Lawson Radar, RN

## 2021-07-05 NOTE — Interval H&P Note (Signed)
History and Physical Interval Note:  07/05/2021 9:39 AM  Thomas Moore  has presented today for surgery, with the diagnosis of Severe Aortic Stenosis.  The various methods of treatment have been discussed with the patient and family. After consideration of risks, benefits and other options for treatment, the patient has consented to  Procedure(s): TRANSCATHETER AORTIC VALVE REPLACEMENT, TRANSFEMORAL (N/A) INTRAOPERATIVE TRANSTHORACIC ECHOCARDIOGRAM (N/A) as a surgical intervention.  The patient's history has been reviewed, patient examined, no change in status, stable for surgery.  I have reviewed the patient's chart and labs.  Questions were answered to the patient's satisfaction.     Alleen Borne

## 2021-07-05 NOTE — Progress Notes (Signed)
°  Echocardiogram 2D Echocardiogram has been performed.  Leta Jungling M 07/05/2021, 1:50 PM

## 2021-07-05 NOTE — Progress Notes (Signed)
VAST consulted to obtain USGIV access for pressors. Called unit and spoke with patient's nurse, Garlon Hatchet; patient has weaned off Norepi and no longer needs access. If circumstances change, Garlon Hatchet will place consult for VAST.

## 2021-07-05 NOTE — Transfer of Care (Signed)
Immediate Anesthesia Transfer of Care Note  Patient: Thomas Moore  Procedure(s) Performed: TRANSCATHETER AORTIC VALVE REPLACEMENT, TRANSFEMORAL (Chest) INTRAOPERATIVE TRANSTHORACIC ECHOCARDIOGRAM  Patient Location: Cath Lab  Anesthesia Type:MAC  Level of Consciousness: awake  Airway & Oxygen Therapy: Patient Spontanous Breathing  Post-op Assessment: Report given to RN and Post -op Vital signs reviewed and stable  Post vital signs: Reviewed and stable  Last Vitals:  Vitals Value Taken Time  BP 101/68   Temp    Pulse 69   Resp 12   SpO2 96     Last Pain:  Vitals:   07/05/21 0834  TempSrc:   PainSc: 5       Patients Stated Pain Goal: 0 (29/51/88 4166)  Complications: No notable events documented.

## 2021-07-05 NOTE — Anesthesia Procedure Notes (Signed)
Arterial Line Insertion Start/End1/31/2023 10:00 AM, 07/05/2021 10:23 AM Performed by: Dorie Rank, CRNA, CRNA  Preanesthetic checklist: patient identified, IV checked, site marked, risks and benefits discussed, surgical consent, monitors and equipment checked, pre-op evaluation and timeout performed Lidocaine 1% used for infiltration Right, radial was placed Catheter size: 20 G Hand hygiene performed , maximum sterile barriers used  and Seldinger technique used Allen's test indicative of satisfactory collateral circulation Attempts: 1 Procedure performed without using ultrasound guided technique. Following insertion, Biopatch and dressing applied.

## 2021-07-05 NOTE — Op Note (Signed)
[facility]   MULTIDISCIPLINARY HEART VALVE TEAM   TAVR OPERATIVE NOTE   Date of Procedure:  07/05/2021  Preoperative Diagnosis: Severe Aortic Stenosis   Postoperative Diagnosis: Same   Procedure:   Transcatheter Aortic Valve Replacement - Percutaneous  Transfemoral Approach  Edwards Sapien 3 Resilia THV (size 26 mm, serial # [id number])   Co-Surgeons:  Alleen Borne, MD and Tonny Bollman, MD  Anesthesiologist:  Karna Christmas, MD  Echocardiographer:  Charlton Haws, MD  Pre-operative Echo Findings: Severe aortic stenosis Mild left ventricular systolic dysfunction  Post-operative Echo Findings: No paravalvular leak Unchanged left ventricular systolic function  BRIEF CLINICAL NOTE AND INDICATIONS FOR SURGERY  66 year old gentleman with type 2 diabetes, CAD status post remote CABG in 1997, history of pulmonary embolism maintained on long-term oral anticoagulation with rivaroxaban, and progressive now symptomatic aortic stenosis.  The patient was found to have severe aortic stenosis by 2D echo with findings suggestive of low-flow low gradient aortic stenosis.  LVEF is 45 to 50%, stroke-volume index is 24, mean transvalvular gradient is 20 mmHg, and calculated aortic valve area 0.77 cm. He has NYHA II symptoms of chronic diastolic CHF. He underwent extensive preop evaluation demonstrating continued patency of the LIMA-LAD graft and sequential SVG-OM1 and OM2. CTA studies showed suitable anatomy for transfemoral TAVR.   During the course of the patient's preoperative work up they have been evaluated comprehensively by a multidisciplinary team of specialists coordinated through the Multidisciplinary Heart Valve Clinic in the [facility].  They have been demonstrated to suffer from symptomatic severe aortic stenosis as noted above. The patient has been counseled extensively as to the relative risks and benefits of all options for the treatment of  severe aortic stenosis including long term medical therapy, conventional surgery for aortic valve replacement, and transcatheter aortic valve replacement.  The patient has been independently evaluated in formal cardiac surgical consultation by Dr Laneta Simmers, who deemed the patient appropriate for TAVR. Based upon review of all of the patient's preoperative diagnostic tests they are felt to be candidate for transcatheter aortic valve replacement using the transfemoral approach as an alternative to conventional surgery.    Following the decision to proceed with transcatheter aortic valve replacement, a discussion has been held regarding what types of management strategies would be attempted intraoperatively in the event of life-threatening complications, including whether or not the patient would be considered a candidate for the use of cardiopulmonary bypass and/or conversion to open sternotomy for attempted surgical intervention.  The patient has been advised of a variety of complications that might develop peculiar to this approach including but not limited to risks of death, stroke, paravalvular leak, aortic dissection or other major vascular complications, aortic annulus rupture, device embolization, cardiac rupture or perforation, acute myocardial infarction, arrhythmia, heart block or bradycardia requiring permanent pacemaker placement, congestive heart failure, respiratory failure, renal failure, pneumonia, infection, other late complications related to structural valve deterioration or migration, or other complications that might ultimately cause a temporary or permanent loss of functional independence or other long term morbidity.  The patient provides full informed consent for the procedure as described and all questions were answered preoperatively.  DETAILS OF THE OPERATIVE PROCEDURE  PREPARATION:   The patient is brought to the operating room on the above mentioned date and central monitoring was  established by the anesthesia team including placement of a radial arterial line. The patient is placed in the supine position on the operating table.  Intravenous antibiotics  are administered. The patient is monitored closely throughout the procedure under conscious sedation.  Baseline transthoracic echocardiogram is performed. The patient's chest, abdomen, both groins, and both lower extremities are prepared and draped in a sterile manner. A time out procedure is performed.   PERIPHERAL ACCESS:   Using ultrasound guidance, femoral arterial and venous access is obtained with placement of 6 Fr sheaths on the left side.  [place] images are digitally captured and stored in the patient's chart. A pigtail diagnostic catheter was passed through the femoral arterial sheath under fluoroscopic guidance into the aortic root.  A temporary transvenous pacemaker catheter was passed through the femoral venous sheath under fluoroscopic guidance into the right ventricle.  The pacemaker was tested to ensure stable lead placement and pacemaker capture. Aortic root angiography was performed in order to determine the optimal angiographic angle for valve deployment.  TRANSFEMORAL ACCESS:  A micropuncture technique is used to access the right femoral artery under fluoroscopic and ultrasound guidance.  2 Perclose devices are deployed at 10' and 2' positions to 'PreClose' the femoral artery. An 8 French sheath is placed and then an Amplatz Superstiff wire is advanced through the sheath. This is changed out for a 14 French transfemoral E-Sheath after progressively dilating over the Superstiff wire.  An AL-2 catheter was used to direct a straight-tip exchange length wire across the native aortic valve into the left ventricle. This was exchanged out for a pigtail catheter and position was confirmed in the LV apex. Simultaneous LV and Ao pressures were recorded.  The pigtail catheter was exchanged for an Amplatz Extra-stiff wire in the LV  apex.    BALLOON AORTIC VALVULOPLASTY:  Not performed  TRANSCATHETER HEART VALVE DEPLOYMENT:  An Edwards Sapien 3 transcatheter heart valve (size 26 mm) was prepared and crimped per manufacturer's guidelines, and the proper orientation of the valve is confirmed on the [employer] delivery system. The valve was advanced through the introducer sheath using normal technique until in an appropriate position in the abdominal aorta beyond the sheath tip. The balloon was then retracted and using the fine-tuning wheel was centered on the valve. The valve was then advanced across the aortic arch using appropriate flexion of the catheter. The valve was carefully positioned across the aortic valve annulus. The Commander catheter was retracted using normal technique. Once final position of the valve has been confirmed by angiographic assessment, the valve is deployed while temporarily holding ventilation and during rapid ventricular pacing to maintain systolic blood pressure < 50 mmHg and pulse pressure < 10 mmHg. The balloon inflation is held for >3 seconds after reaching full deployment volume. Once the balloon has fully deflated the balloon is retracted into the ascending aorta and valve function is assessed using echocardiography. The patient's hemodynamic recovery following valve deployment is good.  The deployment balloon and guidewire are both removed. Echo demostrated acceptable post-procedural gradients, stable mitral valve function, and no aortic insufficiency.    PROCEDURE COMPLETION:  The sheath was removed and femoral artery closure is performed using the 2 previously deployed Perclose devices.  Protamine is administered once femoral arterial repair was complete. The site is clear with no evidence of bleeding or hematoma after the sutures are tightened. The temporary pacemaker and pigtail catheters are removed. Mynx closure is used for contralateral femoral arterial hemostasis for the 6 Fr  sheath.  The patient tolerated the procedure well and is transported to the recovery area in stable condition. There were no immediate intraoperative complications. All sponge instrument  and needle counts are verified correct at completion of the operation.   The patient received a total of 40 mL of intravenous contrast during the procedure.   Tonny Bollman, MD 07/05/2021 5:00 PM

## 2021-07-05 NOTE — Anesthesia Postprocedure Evaluation (Signed)
Anesthesia Post Note  Patient: ROYLEE CHAFFIN  Procedure(s) Performed: TRANSCATHETER AORTIC VALVE REPLACEMENT, TRANSFEMORAL (Chest) INTRAOPERATIVE TRANSTHORACIC ECHOCARDIOGRAM     Patient location during evaluation: Cath Lab Anesthesia Type: MAC Level of consciousness: awake Pain management: pain level controlled Vital Signs Assessment: post-procedure vital signs reviewed and stable Respiratory status: spontaneous breathing, nonlabored ventilation, respiratory function stable and patient connected to nasal cannula oxygen Cardiovascular status: stable and blood pressure returned to baseline Postop Assessment: no apparent nausea or vomiting Anesthetic complications: no   No notable events documented.  Last Vitals:  Vitals:   07/05/21 1900 07/05/21 1919  BP: 133/73 136/74  Pulse: 89 84  Resp: 19 20  Temp:  36.8 C  SpO2: 95% 96%    Last Pain:  Vitals:   07/05/21 1919  TempSrc: Oral  PainSc: 0-No pain                 Kymberley Raz P Julena Barbour

## 2021-07-05 NOTE — Progress Notes (Signed)
Inpatient Diabetes Program Recommendations  AACE/ADA: New Consensus Statement on Inpatient Glycemic Control (2015)  Target Ranges:  Prepandial:   less than 140 mg/dL      Peak postprandial:   less than 180 mg/dL (1-2 hours)      Critically ill patients:  140 - 180 mg/dL   Lab Results  Component Value Date   GLUCAP 122 (H) 07/05/2021   HGBA1C 6.4 (H) 07/01/2021    Review of Glycemic Control  Latest Reference Range & Units 07/05/21 08:08  Glucose-Capillary 70 - 99 mg/dL 122 (H)  (H): Data is abnormally high  Diabetes history: DM2 Outpatient Diabetes medications: Trulicity [id number] mg Q Thursday, Glipizide 20 QD, Metformin 500 BID Current orders for Inpatient glycemic control: None  Referral for uncontrolled DM.  A1C 6.4%.  CBG was 122 mg/dL this am.  Will follow.  Will continue to follow while inpatient.  Thank you, Reche Dixon, MSN, RN Diabetes Coordinator Inpatient Diabetes Program [phone] (team pager from 8a-5p)

## 2021-07-06 ENCOUNTER — Inpatient Hospital Stay (HOSPITAL_COMMUNITY): Payer: Medicare HMO

## 2021-07-06 ENCOUNTER — Encounter: Payer: Medicare HMO | Admitting: Cardiology

## 2021-07-06 ENCOUNTER — Encounter (HOSPITAL_COMMUNITY): Payer: Self-pay | Admitting: Cardiovascular Disease

## 2021-07-06 DIAGNOSIS — Z952 Presence of prosthetic heart valve: Secondary | ICD-10-CM | POA: Diagnosis not present

## 2021-07-06 DIAGNOSIS — I350 Nonrheumatic aortic (valve) stenosis: Principal | ICD-10-CM

## 2021-07-06 LAB — ECHOCARDIOGRAM COMPLETE
AV Mean grad: 6 mmHg
AV Peak grad: 11 mmHg
Ao pk vel: 1.66 m/s
Area-P 1/2: 4.65 cm2
Height: 63 in
S' Lateral: 3.5 cm
Weight: 2585.55 oz

## 2021-07-06 LAB — CBC
HCT: 30.1 % — ABNORMAL LOW (ref 39.0–52.0)
HCT: 32.1 % — ABNORMAL LOW (ref 39.0–52.0)
Hemoglobin: 9.4 g/dL — ABNORMAL LOW (ref 13.0–17.0)
Hemoglobin: 9.9 g/dL — ABNORMAL LOW (ref 13.0–17.0)
MCH: 26.6 pg (ref 26.0–34.0)
MCH: 26.3 pg (ref 26.0–34.0)
MCHC: 31.2 g/dL (ref 30.0–36.0)
MCHC: 30.8 g/dL (ref 30.0–36.0)
MCV: 85 fL (ref 80.0–100.0)
MCV: 85.4 fL (ref 80.0–100.0)
Platelets: 141 10*3/uL — ABNORMAL LOW (ref 150–400)
Platelets: 160 10*3/uL (ref 150–400)
RBC: 3.54 MIL/uL — ABNORMAL LOW (ref 4.22–5.81)
RBC: 3.76 MIL/uL — ABNORMAL LOW (ref 4.22–5.81)
RDW: 17.2 % — ABNORMAL HIGH (ref 11.5–15.5)
RDW: 17.5 % — ABNORMAL HIGH (ref 11.5–15.5)
WBC: 5.5 10*3/uL (ref 4.0–10.5)
WBC: 6.1 10*3/uL (ref 4.0–10.5)
nRBC: 0 % (ref 0.0–0.2)
nRBC: 0 % (ref 0.0–0.2)

## 2021-07-06 LAB — BASIC METABOLIC PANEL
Anion gap: 9 (ref 5–15)
BUN: 11 mg/dL (ref 8–23)
CO2: 23 mmol/L (ref 22–32)
Calcium: 8.6 mg/dL — ABNORMAL LOW (ref 8.9–10.3)
Chloride: 104 mmol/L (ref 98–111)
Creatinine, Ser: 1.04 mg/dL (ref 0.61–1.24)
GFR, Estimated: >60 mL/min (ref >60)
Glucose, Bld: 131 mg/dL — ABNORMAL HIGH (ref 70–99)
Potassium: 4.4 mmol/L (ref 3.5–5.1)
Sodium: 136 mmol/L (ref 135–145)

## 2021-07-06 LAB — GLUCOSE, CAPILLARY
Glucose-Capillary: 130 mg/dL — ABNORMAL HIGH (ref 70–99)
Glucose-Capillary: 143 mg/dL — ABNORMAL HIGH (ref 70–99)
Glucose-Capillary: 151 mg/dL — ABNORMAL HIGH (ref 70–99)
Glucose-Capillary: 176 mg/dL — ABNORMAL HIGH (ref 70–99)
Glucose-Capillary: 236 mg/dL — ABNORMAL HIGH (ref 70–99)
Glucose-Capillary: 290 mg/dL — ABNORMAL HIGH (ref 70–99)

## 2021-07-06 MED ORDER — ASPIRIN EC 81 MG PO TBEC
81.0000 mg | DELAYED_RELEASE_TABLET | Freq: Every day | ORAL | Status: DC
  Administered 2021-07-06 – 2021-07-07 (×2): 81 mg via ORAL
  Filled 2021-07-06 (×2): qty 1

## 2021-07-06 MED ORDER — METOPROLOL SUCCINATE ER 100 MG PO TB24
100.0000 mg | ORAL_TABLET | Freq: Every day | ORAL | Status: DC
  Administered 2021-07-06 – 2021-07-07 (×2): 100 mg via ORAL
  Filled 2021-07-06 (×2): qty 1

## 2021-07-06 MED ORDER — RIVAROXABAN 20 MG PO TABS
20.0000 mg | ORAL_TABLET | Freq: Every day | ORAL | Status: DC
  Administered 2021-07-06: 20 mg via ORAL
  Filled 2021-07-06: qty 1

## 2021-07-06 NOTE — Progress Notes (Signed)
Tonight, Pt 's HR 110-125, sinus tachycardia on monitor. BP 119/75 -145/76 mmHg. CCMD called and reported at 05:29 am that Pt's EKG had one burst of unsustained Atrial fib and HR was up to 160 while he was asymptomatic sleeping comfortably on his bed. His SPO2 88% on room air at night. O2 NCL 2-3 LPM given.   We notified the cardiologist on call, Ms. Ronie Spies, PA. Order received to repeat CBC. Left and right groin dry gauze dressing is dry and clean. No active bleeding or hematoma. Pt denied pain.   His EKG 12 lead at 4:30 am showed sinus tachycardia with Left ventricular hypertrophy with repolarization abnormality. Abnormal ECG when compared with ECG of 01-Jul-2021 08:59. The provider made aware.  We will continue to monitor.  Thomas Pinks, RN

## 2021-07-06 NOTE — Progress Notes (Addendum)
[place] VALVE TEAM  Patient Name: EMMITTE SPINDEL Date of Encounter: 07/06/2021  Primary Cardiologist: Dr. Corky Sox Dr. Burt Knack, MD & Dr. Cyndia Bent, MD (TAVR)  Hospital Problem List     Principal Problem:   S/P TAVR (transcatheter aortic valve replacement) Active Problems:   CAD (coronary artery disease)   Diabetes ([place])   Essential hypertension   History of stroke   Sinus tachycardia   Non-ST elevation (NSTEMI) myocardial infarction [facility])   Coronary artery disease with hx of myocardial infarct w/o hx of CABG   HLD (hyperlipidemia)   Pulmonary embolism ([facility])   Chronic anticoagulation   Severe aortic stenosis   Subjective   Pt feeling good today with no specific complaints. HR noted to be elevated today in the 130 range. No chest pain, palpitations, SOB.   Inpatient Medications    Scheduled Meds:  atorvastatin  40 mg Oral QHS   clopidogrel  75 mg Oral Daily   divalproex  250 mg Oral BID   insulin aspart  0-24 Units Subcutaneous Q4H   metoprolol succinate  100 mg Oral Daily   rivaroxaban  20 mg Oral Q supper   sodium chloride flush  3 mL Intravenous Q12H   Continuous Infusions:  sodium chloride     sodium chloride     nitroGLYCERIN     PRN Meds: sodium chloride, acetaminophen **OR** acetaminophen, morphine injection, ondansetron (ZOFRAN) IV, oxyCODONE, sodium chloride flush, traMADol   Vital Signs    Vitals:   07/06/21 0300 07/06/21 0400 07/06/21 0600 07/06/21 0759  BP: (!) 145/76 140/81  (!) 156/78  Pulse: (!) 114 (!) 114 (!) 120 (!) 102  Resp: (!) 22 20 18 20   Temp: 98.5 F (36.9 C) 98.5 F (36.9 C)  98.5 F (36.9 C)  TempSrc: Oral Oral  Oral  SpO2: 97% 96% 98% 98%  Weight:  73.3 kg    Height:        Intake/Output Summary (Last 24 hours) at 07/06/2021 0921 Last data filed at 07/06/2021 0801 Gross per 24 hour  Intake 1698.92 ml  Output 1210 ml  Net 488.92 ml   Filed Weights   07/05/21 0817 07/06/21 0400   Weight: 73.5 kg 73.3 kg    Physical Exam    General: Well developed, well nourished, NAD Neck: Negative for carotid bruits. No JVD Lungs:Clear to ausculation bilaterally. Breathing is unlabored. Cardiovascular: RRR with S1 S2. Soft flow murmur Abdomen: Soft, non-tender, non-distended. No obvious abdominal masses. Extremities: No edema. Bilateral groin sites with no hematoma or bleeding.  Neuro: Alert and oriented. Psych: Responds to questions appropriately with normal affect.    Labs    CBC Recent Labs    07/06/21 0158 07/06/21 0657  WBC 5.5 6.1  HGB 9.4* 9.9*  HCT 30.1* 32.1*  MCV 85.0 85.4  PLT 141* 0000000   Basic Metabolic Panel Recent Labs    07/05/21 1431 07/06/21 0158  NA 139 136  K 4.6 4.4  CL 104 104  CO2  --  23  GLUCOSE 138* 131*  BUN 13 11  CREATININE 0.80 1.04  CALCIUM  --  8.6*   Liver Function Tests No results for input(s): AST, ALT, ALKPHOS, BILITOT, PROT, ALBUMIN in the last 72 hours. No results for input(s): LIPASE, AMYLASE in the last 72 hours. Cardiac Enzymes No results for input(s): CKTOTAL, CKMB, CKMBINDEX, TROPONINI in the last 72 hours. BNP Invalid input(s): POCBNP D-Dimer No results for input(s): DDIMER in the  last 72 hours. Hemoglobin A1C No results for input(s): HGBA1C in the last 72 hours. Fasting Lipid Panel No results for input(s): CHOL, HDL, LDLCALC, TRIG, CHOLHDL, LDLDIRECT in the last 72 hours. Thyroid Function Tests No results for input(s): TSH, T4TOTAL, T3FREE, THYROIDAB in the last 72 hours.  Invalid input(s): FREET3  Telemetry    07/06/21 ST versus atrial flutter with rates in the 130's - Personally Reviewed  ECG    07/06/21 ST versus atrial flutter with HR 116bpm - Personally Reviewed  Radiology    ECHOCARDIOGRAM LIMITED  Result Date: 07/05/2021    ECHOCARDIOGRAM LIMITED REPORT   Patient Name:   SPURGEON EHLY Godley Date of Exam: 07/05/2021 Medical Rec #:  [id number]      Height:       63.0 in Accession #:    [id number]      Weight:       163.0 lb Date of Birth:  03-Jul-1955       BSA:          1.773 m Patient Age:    66 years       BP:           187/110Severe aortic stenosis                                              [I35.0 (ICD-10-CM)] mmHg Patient Gender: M              HR:           64 bpm. Exam Location:  Inpatient Procedure: Limited Echo, Color Doppler and Cardiac Doppler Indications:     Severe aortic stenosis [I35.0 (ICD-10-CM)]  History:         Patient has prior history of Echocardiogram examinations, most                  recent 04/24/2021. CAD, COPD; Risk Factors:Diabetes,                  Hypertension and Dyslipidemia.                  Aortic Valve: 26 mm Sapien prosthetic, stented (TAVR) valve is                  present in the aortic position.  Sonographer:     Darlina Sicilian RDCS Referring Phys:  [place] Diagnosing Phys: Jenkins Rouge MD IMPRESSIONS  1. Septal apical and inferior lateral hypokinesis . Left ventricular ejection fraction, by estimation, is 40 to 45%. The left ventricle has mildly decreased function. The left ventricle demonstrates regional wall motion abnormalities (see scoring diagram/findings for description). The left ventricular internal cavity size was mildly dilated.  2. The mitral valve is abnormal. Trivial mitral valve regurgitation. Moderate mitral annular calcification.  3. Pre TAVR: severely calcified mean gradient 18 peak 29 supine in cath lab with low EF AVA 1.2 cm2         Post TAVR: well placed 26 mm Sapien 3 valve No PvL AVA 4.6 cm2 peak gradient 3 mean 1 mmhg . The aortic valve has been repaired/replaced. There is a 26 mm Sapien prosthetic (TAVR) valve present in the aortic position. FINDINGS  Left Ventricle: Septal apical and inferior lateral hypokinesis. Left ventricular ejection fraction, by estimation, is 40 to 45%. The left ventricle has mildly decreased function. The left ventricle demonstrates  regional wall motion abnormalities. The left ventricular internal cavity size  was mildly dilated. Pericardium: There is no evidence of pericardial effusion. Mitral Valve: The mitral valve is abnormal. There is mild thickening of the mitral valve leaflet(s). There is mild calcification of the mitral valve leaflet(s). Moderate mitral annular calcification. Trivial mitral valve regurgitation. Aortic Valve: Pre TAVR: severely calcified mean gradient 18 peak 29 supine in cath lab with low EF AVA 1.2 cm2 Post TAVR: well placed 26 mm Sapien 3 valve No PvL AVA 4.6 cm2 peak gradient 3 mean 1 mmhg. The aortic valve has been repaired/replaced. Aortic valve mean gradient measures 1.0 mmHg. Aortic valve peak gradient measures 3.3 mmHg. Aortic valve area, by VTI  measures 4.09 cm. There is a 26 mm Sapien prosthetic, stented (TAVR) valve present in the aortic position. LEFT VENTRICLE PLAX 2D LVOT diam:     2.43 cm LV SV:         71 LV SV Index:   40 LVOT Area:     4.65 cm  AORTIC VALVE AV Area (Vmax):    4.30 cm AV Area (Vmean):   4.32 cm AV Area (VTI):     4.09 cm AV Vmax:           90.50 cm/s AV Vmean:          52.600 cm/s AV VTI:            0.174 m AV Peak Grad:      3.3 mmHg AV Mean Grad:      1.0 mmHg LVOT Vmax:         83.60 cm/s LVOT Vmean:        48.900 cm/s LVOT VTI:          0.153 m LVOT/AV VTI ratio: 0.88  SHUNTS Systemic VTI:  0.15 m Systemic Diam: 2.43 cm Jenkins Rouge MD Electronically signed by Jenkins Rouge MD Signature Date/Time: 07/05/2021/2:07:16 PM    Final    Structural Heart Procedure  Result Date: 07/05/2021 See surgical note for result.   Cardiac Studies   TAVR OPERATIVE NOTE     Date of Procedure:                07/05/2021   Preoperative Diagnosis:      Severe Aortic Stenosis    Postoperative Diagnosis:    Same    Procedure:        Transcatheter Aortic Valve Replacement - Percutaneous  Transfemoral Approach             Edwards Sapien 3 Resilia THV (size 26 mm, serial # [id number])              Co-Surgeons:                        Gaye Pollack, MD and Sherren Mocha, MD   Anesthesiologist:                  Adele Barthel, MD   Echocardiographer:              Jenkins Rouge, MD   Pre-operative Echo Findings: Severe aortic stenosis Mild left ventricular systolic dysfunction   Post-operative Echo Findings: No paravalvular leak Unchanged left ventricular systolic function  Echocardiogram 07/06/21: Pending   Patient Profile     Mr. Pinyan is a 66yo M with a hx of CAD s/p CABG 1997, DM2, seizure disorder, history of PE maintained on long-term oral anticoagulation with rivaroxaban, HTN, HLD,  COPD, CVA, recent admission for chest pain/NSTEMI (hsTrop >10K) and ARF requiring intubation (cath showed stable coronary anatomy) and echo showed LFLG severe AS.   Assessment & Plan   Severe AS: s/p successful TAVR with a 26 mm Edwards Sapien 3UR via the TF approach on 07/05/21. Post operative echo pending. Groin sites are stable. ECG with NSR and no high grade heart block. Continue ASA and plavix. Discontinue central line and transfer to the floor. Early mobilization and hopeful discharge home tomorrow.    Atrial flutter versus sinus tachycardia: Rates noted to be elevated early this AM. PTA Toprol 100mg  held in the post procedure setting. I have restarted this today. Hope to slow rates down for better rhythm assessment. If in fact this is atrial flutter, may need Amiodarone versus possible cardioversion. Restarted Xarelto today.   CAD s/p CABG: Recent LHC with stable CAD. Continue current regimen.   HTN: Elevated above baseline today. Restarted Toprol 100 this AM. Continue to monitor for further titration.   COPD: Stable with no current issues  CVA: No new neuro changes   Incidental findings: Enlarged mediastinal and hilar lymph nodes are stable to slightly decreased in size when compared with prior exam and likely reactive.    SignedKathyrn Drown, NP  07/06/2021, 9:21 AM  Pager [phone]   Patient seen, examined. Available data reviewed. Agree with  findings, assessment, and plan as outlined by Kathyrn Drown, NP.  The patient is independently interviewed and examined.  He is alert, oriented, no distress.  Lungs are clear to auscultation bilaterally, JVP is normal, heart is tachycardic and regular with a soft ejection murmur at the right upper sternal border, abdomen is soft and nontender, bilateral groin sites are clear with no hematoma or ecchymosis, extremities have no edema.  Telemetry demonstrates sinus tachycardia versus atypical atrial flutter heart rate 130 bpm.  However, review of his twelve-lead EKG clearly demonstrates sinus tachycardia.  We will add back his long-acting metoprolol succinate at a dose of 100 mg daily which is his home dose.  We will review his 2D echocardiogram.  Observe overnight because of tachycardia, consider hospital discharge tomorrow.  Sherren Mocha, M.D. 07/06/2021 2:17 PM

## 2021-07-06 NOTE — Progress Notes (Signed)
Notified @ 2131 from CCMD that patient had 7 beat run svt which did not sustain. Patient asymptomatic and sleeping. Will continue to monitor

## 2021-07-06 NOTE — Progress Notes (Signed)
CARDIAC REHAB PHASE I   Offered to walk with pt. Pt declining at this time. Very concerned about the status of his condom cath. NT aware. Pt states some improvement in his breathing, hoping to d/c tomorrow.   [id number] Reynold Bowen, RN BSN 07/06/2021 3:04 PM

## 2021-07-06 NOTE — Op Note (Signed)
[facility]   MULTIDISCIPLINARY HEART VALVE TEAM   TAVR OPERATIVE NOTE   Date of Procedure:  07/05/2021  Preoperative Diagnosis: Severe Aortic Stenosis   Postoperative Diagnosis: Same   Procedure:   Transcatheter Aortic Valve Replacement - Percutaneous Right Transfemoral Approach  Edwards Sapien 3 Ultra Resilia THV (size 26 mm, model # 9755RSL serial # [id number])   Co-Surgeons:  Gaye Pollack, MD and Sherren Mocha, MD     Anesthesiologist:  Perfecto Kingdom, MD  Echocardiographer:  P. Johnsie Cancel, MD  Pre-operative Echo Findings: Severe aortic stenosis Mild left ventricular systolic dysfunction  Post-operative Echo Findings: No paravalvular leak Unchanged mild left ventricular systolic dysfunction   BRIEF CLINICAL NOTE AND INDICATIONS FOR SURGERY  This 66 year old gentleman has stage D2, severe, symptomatic, low-flow/low gradient aortic stenosis with New York Heart Association class II symptoms of exertional fatigue and shortness of breath consistent with chronic diastolic congestive heart failure. He presented in November with a non-ST segment elevation MI in the setting of tachyarrhythmia with acute respiratory failure requiring intubation. I have personally reviewed his 2D echocardiogram, cardiac catheterization, and CTA studies. His echo in November 2022 showed a calcified and thickened aortic valve with restricted leaflet mobility. The mean gradient was 22 mmHg with a peak gradient of 41 mmHg. Valve area was 0.8 cm with a dimensionless index of 0.25 and a low stroke-volume index of 24. His valve certainly looks like a severely stenotic valve. I agree that aortic valve replacement is indicated in this patient for relief of his symptoms and to prevent progressive left ventricular deterioration. With prior coronary bypass graft surgery in addition to multiple other comorbidities I think that transcatheter aortic valve replacement is the best option for treating him. His  gated cardiac CTA shows anatomy suitable for TAVR using a SAPIEN 3 valve. His abdominal and pelvic CTA shows bilateral common iliac artery calcific atherosclerosis but I think there is adequate lumen to allow transfemoral insertion.  The patient was counseled at length regarding treatment alternatives for management of severe symptomatic aortic stenosis. The risks and benefits of surgical intervention has been discussed in detail. Long-term prognosis with medical therapy was discussed. Alternative approaches such as conventional surgical aortic valve replacement, transcatheter aortic valve replacement, and palliative medical therapy were compared and contrasted at length. This discussion was placed in the context of the patient's own specific clinical presentation and past medical history. All of his questions have been addressed.  Following the decision to proceed with transcatheter aortic valve replacement, a discussion was held regarding what types of management strategies would be attempted intraoperatively in the event of life-threatening complications, including whether or not the patient would be considered a candidate for the use of cardiopulmonary bypass and/or conversion to open sternotomy for attempted surgical intervention. With prior coronary bypass graft surgery I do not think he would be a candidate for emergent sternotomy to manage any intraoperative complications. The patient is aware of the fact that transient use of cardiopulmonary bypass may be necessary. The patient has been advised of a variety of complications that might develop including but not limited to risks of death, stroke, paravalvular leak, aortic dissection or other major vascular complications, aortic annulus rupture, device embolization, cardiac rupture or perforation, mitral regurgitation, acute myocardial infarction, arrhythmia, heart block or bradycardia requiring permanent pacemaker placement, congestive heart failure,  respiratory failure, renal failure, pneumonia, infection, other late complications related to structural valve deterioration or migration, or other complications that might ultimately cause a temporary or permanent  loss of functional independence or other long term morbidity. The patient provides full informed consent for the procedure as described and all questions were answered.     DETAILS OF THE OPERATIVE PROCEDURE  PREPARATION:    The patient was brought to the operating room on the above mentioned date and appropriate monitoring was established by the anesthesia team. The patient was placed in the supine position on the operating table.  Intravenous antibiotics were administered. The patient was monitored closely throughout the procedure under conscious sedation.    Baseline transthoracic echocardiogram was performed. The patient's abdomen and both groins were prepped and draped in a sterile manner. A time out procedure was performed.   PERIPHERAL ACCESS:    Using the modified Seldinger technique, femoral arterial and venous access was obtained with placement of 6 Fr sheaths on the left side.  A pigtail diagnostic catheter was passed through the left arterial sheath under fluoroscopic guidance into the aortic root.  A temporary transvenous pacemaker catheter was passed through the left femoral venous sheath under fluoroscopic guidance into the right ventricle.  The pacemaker was tested to ensure stable lead placement and pacemaker capture. Aortic root angiography was performed in order to determine the optimal angiographic angle for valve deployment.   TRANSFEMORAL ACCESS:   Percutaneous transfemoral access and sheath placement was performed using ultrasound guidance.  The right common femoral artery was cannulated using a micropuncture needle and appropriate location was verified using hand injection angiogram.  A pair of Abbott Perclose percutaneous closure devices were placed and a 6  French sheath replaced into the femoral artery.  The patient was heparinized systemically and ACT verified > 250 seconds.    A 14 Fr transfemoral E-sheath was introduced into the right common femoral artery after progressively dilating over an Amplatz superstiff wire. An AL-2 catheter was used to direct a straight-tip exchange length wire across the native aortic valve into the left ventricle. This was exchanged out for a pigtail catheter and position was confirmed in the LV apex. Simultaneous LV and Ao pressures were recorded.  The pigtail catheter was exchanged for an Amplatz Extra-stiff wire in the LV apex.   BALLOON AORTIC VALVULOPLASTY:   Not performed   TRANSCATHETER HEART VALVE DEPLOYMENT:   An Edwards Sapien 3 Ultra transcatheter heart valve (size 26 mm) was prepared and crimped per manufacturer's guidelines, and the proper orientation of the valve is confirmed on the [employer] delivery system. The valve was advanced through the introducer sheath using normal technique until in an appropriate position in the abdominal aorta beyond the sheath tip. The balloon was then retracted and using the fine-tuning wheel was centered on the valve. The valve was then advanced across the aortic arch using appropriate flexion of the catheter. The valve was carefully positioned across the aortic valve annulus. The Commander catheter was retracted using normal technique. Once final position of the valve has been confirmed by angiographic assessment, the valve is deployed while temporarily holding ventilation and during rapid ventricular pacing to maintain systolic blood pressure < 50 mmHg and pulse pressure < 10 mmHg. The balloon inflation is held for >3 seconds after reaching full deployment volume. Once the balloon has fully deflated the balloon is retracted into the ascending aorta and valve function is assessed using echocardiography. There is felt to be no paravalvular leak and no central aortic  insufficiency.  The patient's hemodynamic recovery following valve deployment is good.  The deployment balloon and guidewire are both removed.  PROCEDURE COMPLETION:   The sheath was removed and femoral artery closure performed.  Protamine was administered once femoral arterial repair was complete. The temporary pacemaker, pigtail catheters and femoral sheaths were removed with manual pressure used for hemostasis.  A Mynx femoral closure device was utilized following removal of the diagnostic sheath in the left femoral artery.  The patient tolerated the procedure well and is transported to the cath lab recovery area in stable condition. There were no immediate intraoperative complications. All sponge instrument and needle counts are verified correct at completion of the operation.   No blood products were administered during the operation.  The patient received a total of 40 mL of intravenous contrast during the procedure.   Gaye Pollack, MD 07/05/2021

## 2021-07-06 NOTE — Progress Notes (Signed)
Echocardiogram 2D Echocardiogram has been performed.  Thomas Moore 07/06/2021, 8:41 AM

## 2021-07-06 NOTE — Plan of Care (Signed)
°  Problem: Clinical Measurements: °Goal: Respiratory complications will improve °Outcome: Progressing °Goal: Cardiovascular complication will be avoided °Outcome: Progressing °  °Problem: Activity: °Goal: Risk for activity intolerance will decrease °Outcome: Progressing °  °

## 2021-07-06 NOTE — Progress Notes (Addendum)
Paged by nurse re: trending HR up overnight, has been 1teens-120 since last night, no specific symptoms with this, placed on O2 and maintaining sats without hypoxia, no acute symptoms, sleeping comfortably. Labs show mild downtrend in Hgb but largely stable from prior, BMET also relatively stable. EKG shows sinus tach with nonspecific STTW changes similar to prior, cannot fully exclude flutter. Will recheck CBC this AM but plan to ask rounding team to prioritize early. Nurse will notify for any new symptoms or change in status.

## 2021-07-06 NOTE — Progress Notes (Signed)
°  Transition of Care [facility]) Screening Note   Patient Details  Name: Thomas Moore Date of Birth: Dec 29, 1955   Transition of Care [facility]) CM/SW Contact:    Darrold Span, RN Phone Number: 07/06/2021, 3:54 PM    Transition of Care Department [facility]) has reviewed patient s/p TAVR and no TOC needs have been identified at this time. We will continue to monitor patient advancement through interdisciplinary progression rounds. If new patient transition needs arise, please place a TOC consult.

## 2021-07-06 NOTE — Progress Notes (Signed)
Mobility Specialist: Progress Note   07/06/21 1249  Mobility  Activity Ambulated with assistance in hallway  Level of Assistance Standby assist, set-up cues, supervision of patient - no hands on  Assistive Device Front wheel walker  Distance Ambulated (ft) 290 ft  Activity Response Tolerated well  $Mobility charge 1 Mobility   Pre-Mobility: 108 HR During Mobility: 126 HR Post-Mobility: 111 HR, 160/82 BP, 95% SpO2  Received pt in bed having no complaints and agreeable to mobility. Asymptomatic throughout ambulation, returned back to bed w/ call bell in reach and all needs met.   [facility] Erickson Yamashiro Mobility Specialist Mobility Specialist 4 [place]: [phone] Mobility Specialist 2 [place] and [place]: [phone]

## 2021-07-07 DIAGNOSIS — Z952 Presence of prosthetic heart valve: Secondary | ICD-10-CM

## 2021-07-07 DIAGNOSIS — I519 Heart disease, unspecified: Secondary | ICD-10-CM

## 2021-07-07 LAB — BASIC METABOLIC PANEL
Anion gap: 9 (ref 5–15)
BUN: 12 mg/dL (ref 8–23)
CO2: 26 mmol/L (ref 22–32)
Calcium: 9.1 mg/dL (ref 8.9–10.3)
Chloride: 103 mmol/L (ref 98–111)
Creatinine, Ser: 1.14 mg/dL (ref 0.61–1.24)
GFR, Estimated: >60 mL/min (ref >60)
Glucose, Bld: 146 mg/dL — ABNORMAL HIGH (ref 70–99)
Potassium: 4.2 mmol/L (ref 3.5–5.1)
Sodium: 138 mmol/L (ref 135–145)

## 2021-07-07 LAB — CBC
HCT: 32.4 % — ABNORMAL LOW (ref 39.0–52.0)
Hemoglobin: 10.2 g/dL — ABNORMAL LOW (ref 13.0–17.0)
MCH: 26.6 pg (ref 26.0–34.0)
MCHC: 31.5 g/dL (ref 30.0–36.0)
MCV: 84.4 fL (ref 80.0–100.0)
Platelets: 145 10*3/uL — ABNORMAL LOW (ref 150–400)
RBC: 3.84 MIL/uL — ABNORMAL LOW (ref 4.22–5.81)
RDW: 17.5 % — ABNORMAL HIGH (ref 11.5–15.5)
WBC: 6 10*3/uL (ref 4.0–10.5)
nRBC: 0 % (ref 0.0–0.2)

## 2021-07-07 LAB — GLUCOSE, CAPILLARY
Glucose-Capillary: 144 mg/dL — ABNORMAL HIGH (ref 70–99)
Glucose-Capillary: 156 mg/dL — ABNORMAL HIGH (ref 70–99)
Glucose-Capillary: 98 mg/dL (ref 70–99)

## 2021-07-07 MED ORDER — PANTOPRAZOLE SODIUM 40 MG PO TBEC: 40.0000 mg | DELAYED_RELEASE_TABLET | Freq: Every day | ORAL | 3 refills | Status: DC

## 2021-07-07 NOTE — Progress Notes (Signed)
CARDIAC REHAB PHASE I   TAVR education completed with pt. Pt educated on importance of site care, restrictions, and exercise guidelines. Will refer to CRP II [place].   [id number] Rufina Falco, RN BSN 07/07/2021 10:13 AM

## 2021-07-07 NOTE — Progress Notes (Signed)
Mobility Specialist: Progress Note   07/07/21 1101  Mobility  Activity Ambulated independently in hallway  Level of Assistance Standby assist, set-up cues, supervision of patient - no hands on  Assistive Device None  Distance Ambulated (ft) 470 ft  Activity Response Tolerated well  $Mobility charge 1 Mobility   Pre-Mobility: 87 HR Post-Mobility: 86 HR, 161/79 BP  Received pt in bed having no complaints and agreeable to mobility. Asymptomatic throughout ambulation, returned back to bed w/ call bell in reach and all needs met.  [facility] Sandy Blouch Mobility Specialist Mobility Specialist 4 [place]: [phone] Mobility Specialist 2 [place] and [place]: [phone]

## 2021-07-07 NOTE — Discharge Summary (Addendum)
[facility] VALVE TEAM  Discharge Summary    Patient ID: Thomas Moore MRN: [id number]; DOB: August 29, 1955  Admit date: 07/05/2021 Discharge date: 07/07/2021  Primary Care Provider: Default, Provider, MD  Primary Cardiologist: Dr. Corky Sox Dr. Burt Knack, MD & Dr. Cyndia Bent, MD (TAVR)  Discharge Diagnoses    Principal Problem:   S/P TAVR (transcatheter aortic valve replacement) Active Problems:   CAD (coronary artery disease)   Diabetes ([place])   Essential hypertension   History of stroke   Sinus tachycardia   Non-ST elevation (NSTEMI) myocardial infarction [facility])   Coronary artery disease with hx of myocardial infarct w/o hx of CABG   HLD (hyperlipidemia)   Pulmonary embolism (HCC)   Chronic anticoagulation   Severe aortic stenosis   Systolic dysfunction  Allergies No Known Allergies  Diagnostic Studies/Procedures    TAVR OPERATIVE NOTE     Date of Procedure:                07/05/2021   Preoperative Diagnosis:      Severe Aortic Stenosis    Postoperative Diagnosis:    Same    Procedure:        Transcatheter Aortic Valve Replacement - Percutaneous  Transfemoral Approach             Edwards Sapien 3 Resilia THV (size 26 mm, serial # [id number])              Co-Surgeons:                        Gaye Pollack, MD and Sherren Mocha, MD   Anesthesiologist:                  Adele Barthel, MD   Echocardiographer:              Jenkins Rouge, MD   Pre-operative Echo Findings: Severe aortic stenosis Mild left ventricular systolic dysfunction   Post-operative Echo Findings: No paravalvular leak Unchanged left ventricular systolic function   Echocardiogram 07/06/21:    1. Left ventricular ejection fraction, by estimation, is 40 to 45%. The  left ventricle has mildly decreased function. The left ventricle  demonstrates global hypokinesis. There is moderate concentric left  ventricular hypertrophy. Left ventricular  diastolic parameters are  consistent with Grade II diastolic dysfunction  (pseudonormalization).   2. Right ventricular systolic function is normal. The right ventricular  size is normal.   3. The mitral valve is normal in structure. No evidence of mitral valve  regurgitation. No evidence of mitral stenosis.   4. Well seated bioprothsethic aortic valve. Aortic valve regurgitation is  not visualized. No aortic stenosis is present. There is a 26 mm Sapien  prosthetic (TAVR) valve present in the aortic position. Procedure Date:  07/05/2021. Aortic valve mean  gradient measures 6.0 mmHg. Aortic valve Vmax measures 1.66 m/s.   5. There is mild dilatation of the ascending aorta, measuring 38 mm.   6. The inferior vena cava is normal in size with greater than 50%  respiratory variability, suggesting right atrial pressure of 3 mmHg.   History of Present Illness     Thomas Moore is a 66 y.o. male with a history of CAD s/p CABG 1997, DM2, seizure disorder, history of PE maintained on long-term oral anticoagulation with rivaroxaban, HTN, HLD, COPD, CVA, recent admission for chest pain/NSTEMI (hsTrop >10K) and ARF requiring intubation (cath showed stable coronary anatomy) and echo showed LFLG  severe AS who presented to [facility] on 07/05/21 for planned TAVR.   Hospital Course     Severe AS: s/p successful TAVR with a 26 mm Edwards Sapien 3UR via the TF approach on 07/05/21. Post operative echo with LVEF at 40-45% with global hypokinesis, G2DD, and well seated bioprothsethic aortic valve with mean  gradient measures 1mHg. Groin sites are stable. ECG with ST and no high grade heart block. Xarelto restarted yesterday AM. SBE discussed and will RX'ed at follow up. Post procedure instructions reviewed with understanding.    Sinus tachycardia with a hx of AF: Rates noted to be elevated yesterday morning. EKG and telemetry showed sinus tachycardia. PTA Toprol 1012mrestarted with improvement to the mid 90's. Continue Xarelto.   Chronic  systolic CHF: Appears euvolemic on exam today. Will restart home medications and will consider the addition of Entresto at one month follow up if BP remains elevated and creatinine stable.    CAD s/p CABG: Recent LHC with stable CAD. Continue current regimen although may need to address with concomitant Xarelto. Will defer to primary cardiology team.  No c/o chest pain.    HTN: Restart home Ramipril. [facility] BP remains elevated and no improvement in LV function at 1 month echo.    COPD: Stable with no current issues   CVA: No new neuro changes    Incidental findings: Enlarged mediastinal and hilar lymph nodes are stable to slightly decreased in size when compared with prior exam and likely reactive.   Consultants: None    The patient was seen and examined by Dr. CoBurt Knackho feels that he is stable and ready for discharge today, 07/08/21.  _____________  Discharge Vitals Blood pressure 138/82, pulse 86, temperature 97.8 F (36.6 C), temperature source Oral, resp. rate 20, height '5\' 3"'  (1.6 m), weight 71.5 kg, SpO2 97 %.  Filed Weights   07/05/21 0817 07/06/21 0400 07/07/21 0500  Weight: 73.5 kg 73.3 kg 71.5 kg   General: Well developed, well nourished, NAD Neck: Negative for carotid bruits. No JVD Lungs:Clear to ausculation bilaterally. Breathing is unlabored. Cardiovascular: RRR with S1 S2. Soft murmurs, Extremities: No edema. Neuro: Alert and oriented. No focal deficits. No facial asymmetry. MAE spontaneously. Psych: Responds to questions appropriately with normal affect.    Labs & Radiologic Studies    CBC Recent Labs    07/06/21 0657 07/07/21 0318  WBC 6.1 6.0  HGB 9.9* 10.2*  HCT 32.1* 32.4*  MCV 85.4 84.4  PLT 160 14527  Basic Metabolic Panel Recent Labs    07/06/21 0158 07/07/21 0318  NA 136 138  K 4.4 4.2  CL 104 103  CO2 23 26  GLUCOSE 131* 146*  BUN 11 12  CREATININE 1.04 1.14  CALCIUM 8.6* 9.1   Liver Function Tests No  results for input(s): AST, ALT, ALKPHOS, BILITOT, PROT, ALBUMIN in the last 72 hours. No results for input(s): LIPASE, AMYLASE in the last 72 hours. Cardiac Enzymes No results for input(s): CKTOTAL, CKMB, CKMBINDEX, TROPONINI in the last 72 hours. BNP Invalid input(s): POCBNP D-Dimer No results for input(s): DDIMER in the last 72 hours. Hemoglobin A1C No results for input(s): HGBA1C in the last 72 hours. Fasting Lipid Panel No results for input(s): CHOL, HDL, LDLCALC, TRIG, CHOLHDL, LDLDIRECT in the last 72 hours. Thyroid Function Tests No results for input(s): TSH, T4TOTAL, T3FREE, THYROIDAB in the last 72 hours.  Invalid input(s): FREET3 _____________  DG Chest 2 View  Result Date: 07/01/2021  CLINICAL DATA:  66 year old male with a history of pre-admission for TAVR EXAM: CHEST - 2 VIEW COMPARISON:  04/24/2021 FINDINGS: Cardiomediastinal silhouette likely unchanged, with no evidence central vascular congestion. Surgical changes of median sternotomy and CABG. Cardiac event recorder projects over the left chest. Interval removal of the defibrillator pads. Interval removal of gastric tube, endotracheal tube. Stigmata of emphysema, with increased retrosternal airspace, flattened hemidiaphragms, increased AP diameter, and hyperinflation on the AP view. Improved aeration of the lungs with resolution of bilateral pleural effusions. Coarsened interstitial markings with no confluent airspace disease. No pneumothorax. No pleural effusion. Degenerative changes the spine.  No displaced fracture IMPRESSION: Interval improved aeration of the lungs with resolution of pleural effusions and no definite evidence of acute cardiopulmonary disease. Removal of support apparatus as above Electronically Signed   By: Corrie Mckusick D.O.   On: 07/01/2021 11:36   CT CORONARY MORPH W/CTA COR W/SCORE W/CA W/CM &/OR WO/CM  Addendum Date: 06/08/2021   ADDENDUM REPORT: 06/08/2021 19:13 CLINICAL DATA:  Severe Aortic Stenosis.  EXAM: Cardiac TAVR CT TECHNIQUE: A non-contrast, gated CT scan was obtained with axial slices of 3 mm through the heart for aortic valve calcium scoring. A 130 kV retrospective, gated, contrast cardiac scan was obtained. Gantry rotation speed was 250 msecs and collimation was 0.6 mm. Nitroglycerin was not given. The 3D data set was reconstructed in 5% intervals of the 0-95% of the R-R cycle. Systolic and diastolic phases were analyzed on a dedicated workstation using MPR, MIP, and VRT modes. The patient received 100 cc of contrast. FINDINGS: Image quality: Excellent. Noise artifact is: Limited. Valve Morphology: The aortic valve tricuspid. The valve is diffusely calcified. There is restricted motion in systole consistent with severe aortic stenosis. Aortic Valve Calcium score: 1781 Aortic annular dimension: Phase assessed: 25% Annular area: 464 mm2 Annular perimeter: 79.4 mm Max diameter: 29.0 mm Min diameter: 21.6 mm Annular and subannular calcification: Trace, single calcification under the [place] that is layered. Membranous septum: 3.3 mm Optimal coplanar projection: RAO 19 CAU 2 Coronary Artery Height above Annulus: Left Main: 14.0 mm Right Coronary: 23.4 mm Sinus of Valsalva Measurements: Non-coronary: 38.5 mm Right-coronary: 39.8 mm Left-coronary: 40.2 mm Sinus of Valsalva Height: Non-coronary: 25.6 mm Right-coronary: 27.1 mm Left-coronary: 23.7 mm Sinotubular Junction: 3.26 mm Ascending Thoracic Aorta: 35.8 mm Coronary Arteries: Normal coronary origin. Right dominance. The study was performed without use of NTG and is insufficient for plaque evaluation. Please refer to recent cardiac catheterization for coronary assessment. LIMA-LAD is patent. SVG to an OM branch is patent. SVG-RCA is occluded. Severe 3-vessel coronary calcifications present of the native circulation. Cardiac Morphology: Right Atrium: Right atrial size is within normal limits. Contrast reflux into the IVC consistent with elevated RA pressure.  Right Ventricle: The right ventricular cavity is within normal limits. Left Atrium: Left atrial size is normal in size. Contrast mixing artifact is present in the LAA, cannot exclude thrombus. Left Ventricle: The ventricular cavity size is within normal limits. There are no stigmata of prior infarction. There is no abnormal filling defect. Mildly reduced left ventricular function, LVEF=46%. Global hypokinesis. Postoperative septal movement. Pulmonary arteries: Normal in size without proximal filling defect. Pulmonary veins: Normal pulmonary venous drainage. Pericardium: Normal thickness with no significant effusion or calcium present. Mitral Valve: The mitral valve is normal structure without significant calcification. Extra-cardiac findings: See attached radiology report for non-cardiac structures. IMPRESSION: 1. Annular measurements appropriate for 26 mm S3 TAVR (464 mm2). 2. Trace, single calcification under the [place] that  is layered. 3. Sufficient coronary to annulus distance. 4. Short membranous septum length (3.3 mm). 5. Optimal Fluoroscopic Angle for Delivery: RAO 19 CAU 2 6. Mildly reduced left ventricular function, LVEF=46%. Global hypokinesis. 7. Contrast mixing artifact is present in the LAA, cannot exclude thrombus. 8. Patent LIMA-LAD and SVG-OM.  Occluded SVG-RCA. [place] T. Audie Box, MD Electronically Signed   By: Eleonore Chiquito M.D.   On: 06/08/2021 19:13   Result Date: 06/08/2021 EXAM: OVER-READ INTERPRETATION  CT CHEST The following report is an over-read performed by radiologist Dr. Yetta Glassman of [facility], [place] on 06/08/2021. This over-read does not include interpretation of cardiac or coronary anatomy or pathology. The coronary calcium score/coronary CTA interpretation by the cardiologist is attached. COMPARISON:  None. FINDINGS: Extracardiac findings will be described separately under dictation for contemporaneously obtained CTA chest, abdomen and pelvis. IMPRESSION: Please see separate  dictation for contemporaneously obtained CTA chest, abdomen and pelvis dated 06/08/2021 for full description of relevant extracardiac findings. Electronically Signed: By: Yetta Glassman M.D. On: 06/08/2021 12:32   CT ANGIO CHEST AORTA W/CM & OR WO/CM  Result Date: 06/08/2021 CLINICAL DATA:  66 year-old male, pre treatment planning for TAVR EXAM: CTA chest, ABDOMEN AND PELVIS WITHOUT AND WITH CONTRAST TECHNIQUE: Multidetector CT imaging of the chest, abdomen and pelvis was performed using the standard protocol during bolus administration of intravenous contrast. Multiplanar reconstructed images and MIPs were obtained and reviewed to evaluate the vascular anatomy. CONTRAST:  168m OMNIPAQUE IOHEXOL 350 MG/ML SOLN COMPARISON:  CT chest dated April 23, 2021 FINDINGS: CTA CHEST FINDINGS Cardiovascular: Cardiomegaly. No significant pericardial effusion/thickening. Left main and three-vessel coronary artery calcifications status post CABG. Patent vein graft to a diagonal artery and patent LIMA to the LAD. Occluded RCA vein graft. Great vessels are normal in course and caliber. No central pulmonary emboli. Mediastinum/Nodes: No discrete thyroid nodules. Patulous esophagus. Enlarged mediastinal and hilar lymph nodes, slightly decreased in size when compared with prior exam. Reference subcarinal lymph node measuring 1.5 cm in short axis on series 12, image 57, previously 1.8 cm. And reference right hilar lymph node measuring 1.2 cm on series 12, image 69. Lungs/Pleura: No pneumothorax. No pleural effusion. Right greater than left bibasilar atelectasis. No consolidation. Musculoskeletal: No aggressive appearing focal osseous lesions. Median sternotomy wires are intact. CTA ABDOMEN AND PELVIS FINDINGS Hepatobiliary: Normal liver with no liver mass. Normal gallbladder with no radiopaque cholelithiasis. No biliary ductal dilatation. Pancreas: Normal, with no mass or duct dilation. Spleen: Normal size. No mass.  Adrenals/Urinary Tract: Normal adrenals. Kidneys enhance symmetrically with no evidence of hydronephrosis. Normal bladder. Stomach/Bowel: Normal non-distended stomach. Normal caliber small bowel with no small bowel wall thickening. Normal appendix. Normal large bowel with no diverticulosis, large bowel wall thickening or pericolonic fat stranding. Vascular/Lymphatic: Normal caliber abdominal aorta with calcified and noncalcified plaque. Major branch vessels are patent. Two bilateral renal arteries with mild narrowing at the origins due to calcified and noncalcified plaque. No pathologically enlarged lymph nodes in the abdomen or pelvis. Reproductive: Prostatomegaly, measuring up to 5.2 cm. Other: No pneumoperitoneum, ascites or focal fluid collection. Musculoskeletal: No aggressive appearing focal osseous lesions. Mild superior endplate deformity of L5 with associated Schmorl's node. VASCULAR MEASUREMENTS PERTINENT TO TAVR: AORTA: Minimal Aortic Diameter-9.3 mm Severity of Aortic Calcification-moderate to severe RIGHT PELVIS: Right Common Iliac Artery - Minimal Diameter-4.6 mm Tortuosity-none Calcification-moderate Right External Iliac Artery - Minimal Diameter-6.4 mm Tortuosity-mild Calcification-mild Right Common Femoral Artery - Minimal Diameter-5.2 mm Tortuosity-none Calcification-mild LEFT PELVIS: Left Common Iliac  Artery - Minimal Diameter-5.8 mm Tortuosity-none Calcification-moderate Left External Iliac Artery - Minimal Diameter-8.1 mm Tortuosity-none Calcification-mild Left Common Femoral Artery - Minimal Diameter-5.7 mm Tortuosity-none Calcification-mild Review of the MIP images confirms the above findings. IMPRESSION: 1. Vascular findings and measurements pertinent to potential TAVR procedure, as detailed above. 2. Severe thickening calcification of the aortic valve, compatible with reported clinical history of severe aortic stenosis. 3. 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 Left main and 3 vessel  coronary artery disease status post CABG. 4. Enlarged mediastinal and hilar lymph nodes are stable to slightly decreased in size when compared with prior exam and likely reactive. Electronically Signed   By: Yetta Glassman M.D.   On: 06/08/2021 13:49   ECHOCARDIOGRAM COMPLETE  Result Date: 07/06/2021    ECHOCARDIOGRAM REPORT   Patient Name:   Nidal Blincoe Date of Exam: 07/06/2021 Medical Rec #:  [id number]    Height:       63.0 in Accession #:    [id number]   Weight:       161.6 lb Date of Birth:  1956/04/30     BSA:          1.766 m Patient Age:    66 years     BP:           156/78 mmHg Patient Gender: M            HR:           130 bpm. Exam Location:  Inpatient Procedure: 2D Echo Indications:    Post TAVR  History:        Patient has prior history of Echocardiogram examinations, most                 recent 07/05/2021. CAD; Risk Factors:Hypertension and Diabetes.                 Aortic Valve: 26 mm Sapien prosthetic, stented (TAVR) valve is                 present in the aortic position. Procedure Date: 07/05/2021.  Sonographer:    Jefferey Pica Referring Phys: [phone] JILL D MCDANIEL IMPRESSIONS  1. Left ventricular ejection fraction, by estimation, is 40 to 45%. The left ventricle has mildly decreased function. The left ventricle demonstrates global hypokinesis. There is moderate concentric left ventricular hypertrophy. Left ventricular diastolic parameters are consistent with Grade II diastolic dysfunction (pseudonormalization).  2. Right ventricular systolic function is normal. The right ventricular size is normal.  3. The mitral valve is normal in structure. No evidence of mitral valve regurgitation. No evidence of mitral stenosis.  4. Well seated bioprothsethic aortic valve. Aortic valve regurgitation is not visualized. No aortic stenosis is present. There is a 26 mm Sapien prosthetic (TAVR) valve present in the aortic position. Procedure Date: 07/05/2021. Aortic valve mean gradient measures 6.0 mmHg. Aortic  valve Vmax measures 1.66 m/s.  5. There is mild dilatation of the ascending aorta, measuring 38 mm.  6. The inferior vena cava is normal in size with greater than 50% respiratory variability, suggesting right atrial pressure of 3 mmHg. FINDINGS  Left Ventricle: Left ventricular ejection fraction, by estimation, is 40 to 45%. The left ventricle has mildly decreased function. The left ventricle demonstrates global hypokinesis. The left ventricular internal cavity size was normal in size. There is  moderate concentric left ventricular hypertrophy. Left ventricular diastolic parameters are consistent with Grade II diastolic dysfunction (pseudonormalization). Right Ventricle: The right ventricular size is normal. No increase in  right ventricular wall thickness. Right ventricular systolic function is normal. Left Atrium: Left atrial size was normal in size. Right Atrium: Right atrial size was normal in size. Pericardium: There is no evidence of pericardial effusion. Presence of epicardial fat layer. Mitral Valve: The mitral valve is normal in structure. Mild to moderate mitral annular calcification. No evidence of mitral valve regurgitation. No evidence of mitral valve stenosis. Tricuspid Valve: The tricuspid valve is normal in structure. Tricuspid valve regurgitation is not demonstrated. No evidence of tricuspid stenosis. Aortic Valve: Well seated bioprothsethic aortic valve. The aortic valve has been repaired/replaced. Aortic valve regurgitation is not visualized. No aortic stenosis is present. Aortic valve mean gradient measures 6.0 mmHg. Aortic valve peak gradient measures 11.0 mmHg. There is a 26 mm Sapien prosthetic, stented (TAVR) valve present in the aortic position. Procedure Date: 07/05/2021. Pulmonic Valve: The pulmonic valve was normal in structure. Pulmonic valve regurgitation is trivial. No evidence of pulmonic stenosis. Aorta: The aortic root is normal in size and structure. There is mild dilatation of the  ascending aorta, measuring 38 mm. Venous: The inferior vena cava is normal in size with greater than 50% respiratory variability, suggesting right atrial pressure of 3 mmHg. IAS/Shunts: No atrial level shunt detected by color flow Doppler.  LEFT VENTRICLE PLAX 2D LVIDd:         4.90 cm Diastology LVIDs:         3.50 cm LV e' medial:    6.53 cm/s LV PW:         1.30 cm LV E/e' medial:  19.6 LV IVS:        1.40 cm LV e' lateral:   7.80 cm/s                        LV E/e' lateral: 16.4  RIGHT VENTRICLE             IVC RV Basal diam:  2.50 cm     IVC diam: 1.30 cm RV S prime:     11.50 cm/s TAPSE (M-mode): 1.3 cm LEFT ATRIUM             Index        RIGHT ATRIUM           Index LA diam:        4.00 cm 2.26 cm/m   RA Area:     13.00 cm LA Vol (A2C):   38.8 ml 21.97 ml/m  RA Volume:   32.80 ml  18.57 ml/m LA Vol (A4C):   36.4 ml 20.61 ml/m LA Biplane Vol: 39.1 ml 22.14 ml/m  AORTIC VALVE                    PULMONIC VALVE AV Vmax:           165.67 cm/s  PV Vmax:       1.24 m/s AV Vmean:          114.500 cm/s PV Peak grad:  6.2 mmHg AV VTI:            0.248 m AV Peak Grad:      11.0 mmHg AV Mean Grad:      6.0 mmHg LVOT Vmax:         164.00 cm/s LVOT Vmean:        103.000 cm/s LVOT VTI:          0.231 m LVOT/AV VTI ratio: 0.93  AORTA Ao Root diam: 3.90  cm Ao Asc diam:  3.80 cm MITRAL VALVE MV Area (PHT): 4.65 cm     SHUNTS MV Decel Time: 163 msec     Systemic VTI: 0.23 m MV E velocity: 128.00 cm/s MV A velocity: 119.00 cm/s MV E/A ratio:  1.08 Kardie Tobb DO Electronically signed by Berniece Salines DO Signature Date/Time: 07/06/2021/11:28:27 AM    Final    CUP PACEART REMOTE DEVICE CHECK  Result Date: 07/04/2021 ILR summary report received. Battery status OK. Normal device function. No new symptom, tachy, brady, or pause episodes. No new AF episodes. Monthly summary reports and ROV/PRN. LH  ECHOCARDIOGRAM LIMITED  Result Date: 07/05/2021    ECHOCARDIOGRAM LIMITED REPORT   Patient Name:   Thomas Moore Regal Date of  Exam: 07/05/2021 Medical Rec #:  [id number]      Height:       63.0 in Accession #:    [id number]     Weight:       163.0 lb Date of Birth:  04-07-1956       BSA:          1.773 m Patient Age:    37 years       BP:           187/110Severe aortic stenosis                                              [I35.0 (ICD-10-CM)] mmHg Patient Gender: M              HR:           64 bpm. Exam Location:  Inpatient Procedure: Limited Echo, Color Doppler and Cardiac Doppler Indications:     Severe aortic stenosis [I35.0 (ICD-10-CM)]  History:         Patient has prior history of Echocardiogram examinations, most                  recent 04/24/2021. CAD, COPD; Risk Factors:Diabetes,                  Hypertension and Dyslipidemia.                  Aortic Valve: 26 mm Sapien prosthetic, stented (TAVR) valve is                  present in the aortic position.  Sonographer:     Darlina Sicilian RDCS Referring Phys:  [place] Diagnosing Phys: Jenkins Rouge MD IMPRESSIONS  1. Septal apical and inferior lateral hypokinesis . Left ventricular ejection fraction, by estimation, is 40 to 45%. The left ventricle has mildly decreased function. The left ventricle demonstrates regional wall motion abnormalities (see scoring diagram/findings for description). The left ventricular internal cavity size was mildly dilated.  2. The mitral valve is abnormal. Trivial mitral valve regurgitation. Moderate mitral annular calcification.  3. Pre TAVR: severely calcified mean gradient 18 peak 29 supine in cath lab with low EF AVA 1.2 cm2         Post TAVR: well placed 26 mm Sapien 3 valve No PvL AVA 4.6 cm2 peak gradient 3 mean 1 mmhg . The aortic valve has been repaired/replaced. There is a 26 mm Sapien prosthetic (TAVR) valve present in the aortic position. FINDINGS  Left Ventricle: Septal apical and inferior lateral hypokinesis. Left ventricular ejection fraction, by estimation, is 40  to 45%. The left ventricle has mildly decreased function. The left  ventricle demonstrates regional wall motion abnormalities. The left ventricular internal cavity size was mildly dilated. Pericardium: There is no evidence of pericardial effusion. Mitral Valve: The mitral valve is abnormal. There is mild thickening of the mitral valve leaflet(s). There is mild calcification of the mitral valve leaflet(s). Moderate mitral annular calcification. Trivial mitral valve regurgitation. Aortic Valve: Pre TAVR: severely calcified mean gradient 18 peak 29 supine in cath lab with low EF AVA 1.2 cm2 Post TAVR: well placed 26 mm Sapien 3 valve No PvL AVA 4.6 cm2 peak gradient 3 mean 1 mmhg. The aortic valve has been repaired/replaced. Aortic valve mean gradient measures 1.0 mmHg. Aortic valve peak gradient measures 3.3 mmHg. Aortic valve area, by VTI  measures 4.09 cm. There is a 26 mm Sapien prosthetic, stented (TAVR) valve present in the aortic position. LEFT VENTRICLE PLAX 2D LVOT diam:     2.43 cm LV SV:         71 LV SV Index:   40 LVOT Area:     4.65 cm  AORTIC VALVE AV Area (Vmax):    4.30 cm AV Area (Vmean):   4.32 cm AV Area (VTI):     4.09 cm AV Vmax:           90.50 cm/s AV Vmean:          52.600 cm/s AV VTI:            0.174 m AV Peak Grad:      3.3 mmHg AV Mean Grad:      1.0 mmHg LVOT Vmax:         83.60 cm/s LVOT Vmean:        48.900 cm/s LVOT VTI:          0.153 m LVOT/AV VTI ratio: 0.88  SHUNTS Systemic VTI:  0.15 m Systemic Diam: 2.43 cm Jenkins Rouge MD Electronically signed by Jenkins Rouge MD Signature Date/Time: 07/05/2021/2:07:16 PM    Final    Structural Heart Procedure  Result Date: 07/05/2021 See surgical note for result.  CT ANGIO ABDOMEN PELVIS  W &/OR WO CONTRAST  Result Date: 06/08/2021 CLINICAL DATA:  66 year-old male, pre treatment planning for TAVR EXAM: CTA chest, ABDOMEN AND PELVIS WITHOUT AND WITH CONTRAST TECHNIQUE: Multidetector CT imaging of the chest, abdomen and pelvis was performed using the standard protocol during bolus administration of  intravenous contrast. Multiplanar reconstructed images and MIPs were obtained and reviewed to evaluate the vascular anatomy. CONTRAST:  159m OMNIPAQUE IOHEXOL 350 MG/ML SOLN COMPARISON:  CT chest dated April 23, 2021 FINDINGS: CTA CHEST FINDINGS Cardiovascular: Cardiomegaly. No significant pericardial effusion/thickening. Left main and three-vessel coronary artery calcifications status post CABG. Patent vein graft to a diagonal artery and patent LIMA to the LAD. Occluded RCA vein graft. Great vessels are normal in course and caliber. No central pulmonary emboli. Mediastinum/Nodes: No discrete thyroid nodules. Patulous esophagus. Enlarged mediastinal and hilar lymph nodes, slightly decreased in size when compared with prior exam. Reference subcarinal lymph node measuring 1.5 cm in short axis on series 12, image 57, previously 1.8 cm. And reference right hilar lymph node measuring 1.2 cm on series 12, image 69. Lungs/Pleura: No pneumothorax. No pleural effusion. Right greater than left bibasilar atelectasis. No consolidation. Musculoskeletal: No aggressive appearing focal osseous lesions. Median sternotomy wires are intact. CTA ABDOMEN AND PELVIS FINDINGS Hepatobiliary: Normal liver with no liver mass. Normal gallbladder with no radiopaque cholelithiasis. No biliary ductal  dilatation. Pancreas: Normal, with no mass or duct dilation. Spleen: Normal size. No mass. Adrenals/Urinary Tract: Normal adrenals. Kidneys enhance symmetrically with no evidence of hydronephrosis. Normal bladder. Stomach/Bowel: Normal non-distended stomach. Normal caliber small bowel with no small bowel wall thickening. Normal appendix. Normal large bowel with no diverticulosis, large bowel wall thickening or pericolonic fat stranding. Vascular/Lymphatic: Normal caliber abdominal aorta with calcified and noncalcified plaque. Major branch vessels are patent. Two bilateral renal arteries with mild narrowing at the origins due to calcified and  noncalcified plaque. No pathologically enlarged lymph nodes in the abdomen or pelvis. Reproductive: Prostatomegaly, measuring up to 5.2 cm. Other: No pneumoperitoneum, ascites or focal fluid collection. Musculoskeletal: No aggressive appearing focal osseous lesions. Mild superior endplate deformity of L5 with associated Schmorl's node. VASCULAR MEASUREMENTS PERTINENT TO TAVR: AORTA: Minimal Aortic Diameter-9.3 mm Severity of Aortic Calcification-moderate to severe RIGHT PELVIS: Right Common Iliac Artery - Minimal Diameter-4.6 mm Tortuosity-none Calcification-moderate Right External Iliac Artery - Minimal Diameter-6.4 mm Tortuosity-mild Calcification-mild Right Common Femoral Artery - Minimal Diameter-5.2 mm Tortuosity-none Calcification-mild LEFT PELVIS: Left Common Iliac Artery - Minimal Diameter-5.8 mm Tortuosity-none Calcification-moderate Left External Iliac Artery - Minimal Diameter-8.1 mm Tortuosity-none Calcification-mild Left Common Femoral Artery - Minimal Diameter-5.7 mm Tortuosity-none Calcification-mild Review of the MIP images confirms the above findings. IMPRESSION: 1. Vascular findings and measurements pertinent to potential TAVR procedure, as detailed above. 2. Severe thickening calcification of the aortic valve, compatible with reported clinical history of severe aortic stenosis. 3. 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 Left main and 3 vessel coronary artery disease status post CABG. 4. Enlarged mediastinal and hilar lymph nodes are stable to slightly decreased in size when compared with prior exam and likely reactive. Electronically Signed   By: Yetta Glassman M.D.   On: 06/08/2021 13:49   Disposition   Pt is being discharged home today in good condition.  Follow-up Plans & Appointments    Follow-up Information     Eileen Stanford, PA-C Follow up on 07/15/2021.   Specialties: Cardiology, Radiology Why: at 1030am. Please arrive at 10:15am. Contact information: [address] [place] [address] [phone]                Discharge Instructions     Call MD for:  difficulty breathing, headache or visual disturbances   Complete by: As directed    Call MD for:  extreme fatigue   Complete by: As directed    Call MD for:  hives   Complete by: As directed    Call MD for:  persistant dizziness or light-headedness   Complete by: As directed    Call MD for:  persistant nausea and vomiting   Complete by: As directed    Call MD for:  redness, tenderness, or signs of infection (pain, swelling, redness, odor or green/yellow discharge around incision site)   Complete by: As directed    Call MD for:  severe uncontrolled pain   Complete by: As directed    Call MD for:  temperature >100.4   Complete by: As directed    Diet - low sodium heart healthy   Complete by: As directed    Discharge instructions   Complete by: As directed    Some studies suggest Prilosec/Omeprazole interacts with Plavix. We changed your Prilosec/Omeprazole to the equivalent dose of Protonix for less chance of interaction.  ACTIVITY AND EXERCISE  Daily activity and exercise are an important part of your recovery. People recover at different rates depending on  their general health and type of valve procedure.  Most people recovering from TAVR feel better relatively quickly   No lifting, pushing, pulling more than 10 pounds (examples to avoid: groceries, vacuuming, gardening, golfing):             - For one week with a procedure through the groin.             - For six weeks for procedures through the chest wall or neck. NOTE: You will typically see one of our providers 7-14 days after your procedure to discuss [place] the above activities.      DRIVING  Do not drive until you are seen for follow up and cleared by a provider. Generally, we ask patient to not drive for 1 week after their procedure.  If you have been told by your doctor in the past that  you may not drive, you must talk with him/her before you begin driving again.   DRESSING  Groin site: you may leave the clear dressing over the site for up to one week or until it falls off.   HYGIENE  If you had a femoral (leg) procedure, you may take a shower when you return home. After the shower, pat the site dry. Do NOT use powder, oils or lotions in your groin area until the site has completely healed.  If you had a chest procedure, you may shower when you return home unless specifically instructed not to by your discharging practitioner.             - DO NOT scrub incision; pat dry with a towel.             - DO NOT apply any lotions, oils, powders to the incision.             - No tub baths / swimming for at least 2 weeks.  If you notice any fevers, chills, increased pain, swelling, bleeding or pus, please contact your doctor.   ADDITIONAL INFORMATION  If you are going to have an upcoming dental procedure, please contact our office as you will require antibiotics ahead of time to prevent infection on your heart valve.    If you have any questions or concerns you can call the structural heart phone during normal business hours 8am-4pm. If you have an urgent need after hours or weekends please call [phone] to talk to the on call provider for general cardiology. If you have an emergency that requires immediate attention, please call 911.    After TAVR Checklist  Check  Test Description  Follow up appointment in 1-2 weeks  You will see our structural heart advanced practice provider. Your incision sites will be checked and you will be cleared to drive and resume all normal activities if you are doing well.    1 month echo and follow up  You will have an echo to check on your new heart valve and be seen back in the office by a structural heart advanced practice provider.  Follow up with your primary cardiologist You will need to be seen by your primary cardiologist in the  following 3-6 months after your 1 month appointment in the valve clinic.   1 year echo and follow up You will have another echo to check on your heart valve after 1 year and be seen back in the office by a structural heart advanced practice provider. This your last structural heart visit.  Bacterial endocarditis prophylaxis  You will have to take antibiotics for the rest of your life before all dental procedures (even teeth cleanings) to protect your heart valve. Antibiotics are also required before some surgeries. Please check with your cardiologist before scheduling any surgeries. Also, please make sure to tell us if you have a penicillin allergy as you will require an alternative antibiotic.   If the dressing is still on your incision site when you go home, remove it on the third day after your surgery date. Remove dressing if it begins to fall off, or if it is dirty or damaged before the third day.   Complete by: As directed    Increase activity slowly   Complete by: As directed       Discharge Medications   Allergies as of 07/07/2021   No Known Allergies      Medication List     STOP taking these medications    omeprazole 40 MG capsule Commonly known as: PRILOSEC       TAKE these medications    acetaminophen 650 MG CR tablet Commonly known as: TYLENOL Take 1,300 mg by mouth every 8 (eight) hours as needed for pain.   atorvastatin 40 MG tablet Commonly known as: LIPITOR TAKE ONE TABLET BY MOUTH AT BEDTIME   blood glucose meter kit and supplies Kit Dispense based on patient and insurance preference. Use up to four times daily as directed. (FOR ICD-9 250.00, 250.01).   CALCIUM + D PO Take 1 tablet by mouth 2 (two) times daily.   clopidogrel 75 MG tablet Commonly known as: PLAVIX Take 1 tablet (75 mg total) by mouth daily.   divalproex 250 MG 24 hr tablet Commonly known as: DEPAKOTE ER Take 250 mg by mouth 2 (two) times daily.   divalproex 500 MG DR tablet Commonly  known as: DEPAKOTE Take 1 tablet (500 mg total) by mouth at bedtime.   furosemide 20 MG tablet Commonly known as: Lasix Take 1 tablet (20 mg total) by mouth daily.   glipiZIDE 10 MG 24 hr tablet Commonly known as: GLUCOTROL XL TAKE ONE TABLET BY MOUTH 2 TIMES A DAY What changed:  how much to take when to take this   ipratropium 0.06 % nasal spray Commonly known as: ATROVENT Place 2 sprays into both nostrils daily as needed for rhinitis.   isosorbide mononitrate 30 MG 24 hr tablet Commonly known as: IMDUR Take 30 mg by mouth daily.   metFORMIN 500 MG 24 hr tablet Commonly known as: GLUCOPHAGE-XR TAKE TWO TABLETS BY MOUTH 2 TIMES A DAY   metoprolol succinate 100 MG 24 hr tablet Commonly known as: TOPROL-XL Take 1 tablet (100 mg total) by mouth daily.   multivitamin capsule Take 1 capsule by mouth daily.   nitroGLYCERIN 0.4 MG SL tablet Commonly known as: NITROSTAT Place 0.4 mg under the tongue every 5 (five) minutes as needed for chest pain.   pantoprazole 40 MG tablet Commonly known as: Protonix Take 1 tablet (40 mg total) by mouth daily.   ramipril 2.5 MG capsule Commonly known as: ALTACE Take 1 capsule (2.5 mg total) by mouth daily.   True Metrix Blood Glucose Test test strip Generic drug: glucose blood SMARTSIG:Via Meter   Precision QID Test test strip Generic drug: glucose blood 1 each (1 strip total) 2 (two) times daily Use as instructed.   Trulicity 7.65 YY/5.0PT Sopn Generic drug: Dulaglutide Inject 0.75 mg into the skin once a week. Wednesday What changed:  when to take this additional instructions  Xarelto 20 MG Tabs tablet Generic drug: rivaroxaban Take 20 mg by mouth daily with supper.               Discharge Care Instructions  (From admission, onward)           Start     Ordered   07/07/21 0000  If the dressing is still on your incision site when you go home, remove it on the third day after your surgery date. Remove dressing  if it begins to fall off, or if it is dirty or damaged before the third day.        07/07/21 0956            Outstanding Labs/Studies   None   Duration of Discharge Encounter   Greater than 30 minutes including physician time.  SignedKathyrn Drown, NP 07/07/2021, 9:56 AM [phone]   Patient seen, examined. Available data reviewed. Agree with findings, assessment, and plan as outlined by Kathyrn Drown, NP.  The patient is independently interviewed and examined.  He is alert and oriented, no distress.  Lungs are clear, heart is regular rate and rhythm with a soft systolic ejection murmur at the right upper sternal border, abdomen soft nontender, extremities have no edema, bilateral groin sites are clear.  The patient's echocardiogram shows stable LV function with LVEF 40 to 45% and normal function of his TAVR bioprosthesis with a mean gradient of 6 mmHg.  The patient has done well with TAVR without complication.  He is medically stable for discharge today.  Plans as above.  Sherren Mocha, M.D. 07/07/2021 3:24 PM

## 2021-07-07 NOTE — Progress Notes (Signed)
Pt to be d/c home from 4E. D/c instructions and medication education provided. Tele and IV d/c'd.   Brooke Pace, RN

## 2021-07-08 ENCOUNTER — Telehealth: Payer: Self-pay | Admitting: Cardiology

## 2021-07-08 NOTE — Telephone Encounter (Signed)
Duplicate documentation: Follow up s/p TAVR

## 2021-07-08 NOTE — Telephone Encounter (Signed)
°  [place] VALVE TEAM   Patient contacted regarding discharge from [facility] on 07/07/21.    Patient understands to follow up with provider Kathyrn Drown, NP-C on 07/15/21 at the [place] location.  Patient understands discharge instructions? Yes  Patient understands medications and regimen? Yes  Patient understands to bring all medications to this visit? Yes   Kathyrn Drown NP-C Structural Heart Team  Pager: [phone]

## 2021-07-11 MED FILL — Heparin Sodium (Porcine) Inj 1000 Unit/ML: Qty: 1000 | Status: AC

## 2021-07-11 MED FILL — Magnesium Sulfate Inj 50%: INTRAMUSCULAR | Qty: 10 | Status: AC

## 2021-07-11 MED FILL — Potassium Chloride Inj 2 mEq/ML: INTRAVENOUS | Qty: 40 | Status: AC

## 2021-07-11 NOTE — Progress Notes (Signed)
Carelink Summary Report / Loop Recorder 

## 2021-07-12 ENCOUNTER — Ambulatory Visit (INDEPENDENT_AMBULATORY_CARE_PROVIDER_SITE_OTHER): Payer: Medicare HMO | Admitting: Vascular Surgery

## 2021-07-12 ENCOUNTER — Other Ambulatory Visit: Payer: Self-pay

## 2021-07-12 ENCOUNTER — Ambulatory Visit (INDEPENDENT_AMBULATORY_CARE_PROVIDER_SITE_OTHER): Payer: Medicare HMO

## 2021-07-12 VITALS — BP 130/66 | HR 89 | Ht 63.0 in | Wt 160.0 lb

## 2021-07-12 DIAGNOSIS — Z8673 Personal history of transient ischemic attack (TIA), and cerebral infarction without residual deficits: Secondary | ICD-10-CM | POA: Diagnosis not present

## 2021-07-12 DIAGNOSIS — I10 Essential (primary) hypertension: Secondary | ICD-10-CM

## 2021-07-12 DIAGNOSIS — E119 Type 2 diabetes mellitus without complications: Secondary | ICD-10-CM | POA: Diagnosis not present

## 2021-07-12 DIAGNOSIS — I2510 Atherosclerotic heart disease of native coronary artery without angina pectoris: Secondary | ICD-10-CM | POA: Diagnosis not present

## 2021-07-12 DIAGNOSIS — I6523 Occlusion and stenosis of bilateral carotid arteries: Secondary | ICD-10-CM

## 2021-07-12 NOTE — Assessment & Plan Note (Signed)
Carotid duplex today reveals stable 1 to 39% ICA stenosis bilaterally.  Unlikely to be the cause of his previous stroke.  No change in medical regimen at this point.  No role for intervention at this degree of stenosis.  Recheck in 1 year

## 2021-07-12 NOTE — Progress Notes (Signed)
MRN : [id number]  Thomas Moore is a 66 y.o. (08-Oct-1955) male who presents with chief complaint of  Chief Complaint  Patient presents with   Follow-up    1 yr Carotid  .  History of Present Illness: Patient returns in follow-up of his carotid disease.  He has had an eventful past several months with a hospital admission and subsequent aortic valve procedure for aortic stenosis but sounds like it was critical.  He just finished that in the last few weeks.  He seems to be doing well from it.  No focal neurologic symptoms. Specifically, the patient denies amaurosis fugax, speech or swallowing difficulties, or arm or leg weakness or numbness.  Carotid duplex today reveals stable 1 to 39% ICA stenosis bilaterally.  Current Outpatient Medications  Medication Sig Dispense Refill   acetaminophen (TYLENOL) 650 MG CR tablet Take 1,300 mg by mouth every 8 (eight) hours as needed for pain.     blood glucose meter kit and supplies KIT Dispense based on patient and insurance preference. Use up to four times daily as directed. (FOR ICD-9 250.00, 250.01). 1 each 0   Calcium Citrate-Vitamin D (CALCIUM + D PO) Take 1 tablet by mouth 2 (two) times daily.     clopidogrel (PLAVIX) 75 MG tablet Take 1 tablet (75 mg total) by mouth daily. 30 tablet 0   divalproex (DEPAKOTE ER) 250 MG 24 hr tablet Take 250 mg by mouth 2 (two) times daily.     divalproex (DEPAKOTE) 500 MG DR tablet Take 1 tablet (500 mg total) by mouth at bedtime.     glucose blood (PRECISION QID TEST) test strip 1 each (1 strip total) 2 (two) times daily Use as instructed.     ipratropium (ATROVENT) 0.06 % nasal spray Place 2 sprays into both nostrils daily as needed for rhinitis.     isosorbide mononitrate (IMDUR) 30 MG 24 hr tablet Take 30 mg by mouth daily.     metoprolol succinate (TOPROL-XL) 100 MG 24 hr tablet Take 1 tablet (100 mg total) by mouth daily. 30 tablet 3   Multiple Vitamin (MULTIVITAMIN) capsule Take 1 capsule by mouth daily.      nitroGLYCERIN (NITROSTAT) 0.4 MG SL tablet Place 0.4 mg under the tongue every 5 (five) minutes as needed for chest pain.     pantoprazole (PROTONIX) 40 MG tablet Take 1 tablet (40 mg total) by mouth daily. 90 tablet 3   ramipril (ALTACE) 2.5 MG capsule Take 1 capsule (2.5 mg total) by mouth daily. 30 capsule 0   TRUE METRIX BLOOD GLUCOSE TEST test strip SMARTSIG:Via Meter     TRULICITY 1.49 FW/2.6VZ SOPN Inject 0.75 mg into the skin once a week. Wednesday (Patient taking differently: Inject 0.75 mg into the skin every Thursday.) 1 mL 4   XARELTO 20 MG TABS tablet Take 20 mg by mouth daily with supper.     atorvastatin (LIPITOR) 40 MG tablet TAKE ONE TABLET BY MOUTH AT BEDTIME 30 tablet 3   furosemide (LASIX) 20 MG tablet Take 1 tablet (20 mg total) by mouth daily. 30 tablet 0   glipiZIDE (GLUCOTROL XL) 10 MG 24 hr tablet TAKE ONE TABLET BY MOUTH 2 TIMES A DAY (Patient taking differently: Take 20 mg by mouth daily.) 60 tablet 3   metFORMIN (GLUCOPHAGE-XR) 500 MG 24 hr tablet TAKE TWO TABLETS BY MOUTH 2 TIMES A DAY 120 tablet 3   No current facility-administered medications for this visit.    Past Medical History:  Diagnosis Date   CAD (coronary artery disease) of artery bypass graft    s/p CABG x 4 in 1997   COPD (chronic obstructive pulmonary disease) (HCC)    Coronary artery disease    CVA (cerebral vascular accident) ([place])    Diabetes mellitus without complication ([place])    Dysrhythmia    GERD (gastroesophageal reflux disease)    HLD (hyperlipidemia)    Hypertension    S/P TAVR (transcatheter aortic valve replacement) 07/05/2021   with Edwards 65m S3UR via TF approach with Dr. CBurt Knackand Dr. BCyndia Bent  Seizures ([facility]     Past Surgical History:  Procedure Laterality Date   BYPASS GRAFT ANGIOGRAPHY N/A 04/25/2021   Procedure: BYPASS GRAFT ANGIOGRAPHY;  Surgeon: OAndrez Grime MD;  [facility];  Service: Cardiovascular;  Laterality: N/A;   CARDIAC SURGERY      CORNEAL TRANSPLANT Right    CORONARY ARTERY BYPASS GRAFT  1997   EYE SURGERY     FLEXOR TENDON REPAIR Left 07/11/2019   Procedure: FLEXOR tenolysis  REPAIR LEFT RING FINGER with tednon repair;  Surgeon: MHessie Knows MD;  Location: [facility];  Service: Orthopedics;  Laterality: Left;   INCISION AND DRAINAGE ABSCESS Left 05/08/2019   Procedure: INCISION AND DRAINAGE ABSCESS;  Surgeon: HDereck Leep MD;  Location: [facility];  Service: Orthopedics;  Laterality: Left;   INTRAOPERATIVE TRANSTHORACIC ECHOCARDIOGRAM N/A 07/05/2021   Procedure: INTRAOPERATIVE TRANSTHORACIC ECHOCARDIOGRAM;  Surgeon: CSherren Mocha MD;  [facility]  Service: Open Heart Surgery;  Laterality: N/A;   LEFT HEART CATH AND CORS/GRAFTS ANGIOGRAPHY N/A 10/02/2019   Procedure: LEFT HEART CATH AND CORS/GRAFTS ANGIOGRAPHY;  Surgeon: HLeonie Man MD;  [facility];  Service: Cardiovascular;  Laterality: N/A;   LOOP RECORDER INSERTION N/A 10/06/2019   Procedure: LOOP RECORDER INSERTION;  Surgeon: TEvans Lance MD;  [facility];  Service: Cardiovascular;  Laterality: N/A;   TRANSCATHETER AORTIC VALVE REPLACEMENT, TRANSFEMORAL N/A 07/05/2021   Procedure: TRANSCATHETER AORTIC VALVE REPLACEMENT, TRANSFEMORAL;  Surgeon: CSherren Mocha MD;  [place] [facility]  Service: Open Heart Surgery;  Laterality: N/A;   TRIGGER FINGER RELEASE Left       Social History             Tobacco Use   Smoking status: Former Smoker      Types: Cigarettes   Smokeless tobacco: Never Used  Substance Use Topics   Alcohol use: No      Alcohol/week: 0.0 standard drinks   Drug use: No                 Family History  Problem Relation Age of Onset   Seizures Brother    Brother with stroke and cancer Sister with stroke and brain tumor No bleeding or clotting disorders   No Known Allergies     REVIEW OF SYSTEMS (Negative unless checked)   Constitutional: _0 Weight loss  _1 Fever  _2 Chills Cardiac:  _3 Chest pain   _4 Chest pressure   _5 Palpitations   _6 Shortness of breath when laying flat   _7 Shortness of breath at rest   _8 Shortness of breath with exertion. Vascular:  _9 Pain in legs with walking   _10 Pain in legs at rest   _11 Pain in legs when laying flat   _12 Claudication   _13 Pain in feet when walking  _14 Pain in feet at rest  _15 Pain in feet when laying flat   _16 History of DVT   _17 Phlebitis   _18 Swelling in legs   _19   Varicose veins   _0 Non-healing ulcers Pulmonary:   _1 Uses home oxygen   _2 Productive cough   _3 Hemoptysis   _4 Wheeze  _5 COPD   _6 Asthma Neurologic:  _7 Dizziness  _8 Blackouts   _9 Seizures   _10 History of stroke   _11 History of TIA  _12 Aphasia   _13 Temporary blindness   _14 Dysphagia   _15 Weakness or numbness in arms   _16 Weakness or numbness in legs Musculoskeletal:  _17 Arthritis   _18 Joint swelling   _19 Joint pain   _20 Low back pain Hematologic:  _21 Easy bruising  _22 Easy bleeding   _23 Hypercoagulable state   _24 Anemic  _25 Hepatitis Gastrointestinal:  _26 Blood in stool   _27 Vomiting blood  _28 Gastroesophageal reflux/heartburn   _29 Abdominal pain Genitourinary:  _30 Chronic kidney disease   _31 Difficult urination  _32 Frequent urination  _33 Burning with urination   _34 Hematuria Skin:  _35 Rashes   _36 Ulcers   _37 Wounds Psychological:  _38 History of anxiety   _39  History of major depression.       Physical Examination  Vitals:   07/12/21 0930  BP: 130/66  Pulse: 89  Weight: 160 lb (72.6 kg)  Height: _40  (1.6 m)   Body mass index is 28.34 kg/m. Gen:  WD/WN, NAD Head: [place]/AT, No temporalis wasting. Ear/Nose/Throat: Hearing grossly intact, nares w/o erythema or drainage, trachea midline Eyes: Conjunctiva clear. Sclera non-icteric Neck: Supple.  No bruit  Pulmonary:  Good air movement, equal and clear to auscultation bilaterally.  Cardiac: RRR, No JVD Vascular:  Vessel Right Left  Radial Palpable Palpable       Musculoskeletal: M/S 5/5 throughout.  No deformity or atrophy.  No edema. Neurologic: CN  2-12 intact. Sensation grossly intact in extremities.  Symmetrical.  Speech is fluent. Motor exam as listed above. Psychiatric: Judgment intact, Mood & affect appropriate for pt's clinical situation. Dermatologic: No rashes or ulcers noted.  No cellulitis or open wounds.     CBC Lab Results  Component Value Date   WBC 6.0 07/07/2021   HGB 10.2 (L) 07/07/2021   HCT 32.4 (L) 07/07/2021   MCV 84.4 07/07/2021   PLT 145 (L) 07/07/2021    BMET    Component Value Date/Time   NA 138 07/07/2021 0318   NA 141 05/18/2021 1021   K 4.2 07/07/2021 0318   CL 103 07/07/2021 0318   CO2 26 07/07/2021 0318   GLUCOSE 146 (H) 07/07/2021 0318   BUN 12 07/07/2021 0318   BUN 42 (H) 05/18/2021 1021   CREATININE 1.14 07/07/2021 0318   CALCIUM 9.1 07/07/2021 0318   GFRNONAA >60 07/07/2021 0318   GFRAA 76 02/25/2020 1126   Estimated Creatinine Clearance: 57 mL/min (by C-G formula based on SCr of 1.14 mg/dL).  COAG Lab Results  Component Value Date   INR 1.1 07/01/2021   INR 1.0 04/23/2021   INR 2.4 (H) 05/12/2020    Radiology DG Chest 2 View  Result Date: 07/01/2021 CLINICAL DATA:  66 year old male with a history of pre-admission for TAVR EXAM: CHEST - 2 VIEW COMPARISON:  04/24/2021 FINDINGS: Cardiomediastinal silhouette likely unchanged, with no evidence central vascular congestion. Surgical changes of median sternotomy and CABG. Cardiac event recorder projects over the left chest. Interval removal of the defibrillator pads. Interval removal of gastric tube, endotracheal tube. Stigmata of emphysema, with increased retrosternal airspace, flattened hemidiaphragms, increased AP diameter, and hyperinflation on the AP view. Improved aeration of the lungs with resolution of bilateral pleural effusions. Coarsened interstitial markings with no confluent airspace disease. No pneumothorax. No pleural effusion. Degenerative changes the spine.  No displaced fracture IMPRESSION: Interval  improved aeration of  the lungs with resolution of pleural effusions and no definite evidence of acute cardiopulmonary disease. Removal of support apparatus as above Electronically Signed   By: Corrie Mckusick D.O.   On: 07/01/2021 11:36   ECHOCARDIOGRAM COMPLETE  Result Date: 07/06/2021    ECHOCARDIOGRAM REPORT   Patient Name:   DARRLY Grenfell Date of Exam: 07/06/2021 Medical Rec #:  [id number]    Height:       63.0 in Accession #:    [id number]   Weight:       161.6 lb Date of Birth:  23-Mar-1956     BSA:          1.766 m Patient Age:    26 years     BP:           156/78 mmHg Patient Gender: M            HR:           130 bpm. Exam Location:  Inpatient Procedure: 2D Echo Indications:    Post TAVR  History:        Patient has prior history of Echocardiogram examinations, most                 recent 07/05/2021. CAD; Risk Factors:Hypertension and Diabetes.                 Aortic Valve: 26 mm Sapien prosthetic, stented (TAVR) valve is                 present in the aortic position. Procedure Date: 07/05/2021.  Sonographer:    Jefferey Pica Referring Phys: [phone] JILL D MCDANIEL IMPRESSIONS  1. Left ventricular ejection fraction, by estimation, is 40 to 45%. The left ventricle has mildly decreased function. The left ventricle demonstrates global hypokinesis. There is moderate concentric left ventricular hypertrophy. Left ventricular diastolic parameters are consistent with Grade II diastolic dysfunction (pseudonormalization).  2. Right ventricular systolic function is normal. The right ventricular size is normal.  3. The mitral valve is normal in structure. No evidence of mitral valve regurgitation. No evidence of mitral stenosis.  4. Well seated bioprothsethic aortic valve. Aortic valve regurgitation is not visualized. No aortic stenosis is present. There is a 26 mm Sapien prosthetic (TAVR) valve present in the aortic position. Procedure Date: 07/05/2021. Aortic valve mean gradient measures 6.0 mmHg. Aortic valve Vmax measures 1.66 m/s.  5. There  is mild dilatation of the ascending aorta, measuring 38 mm.  6. The inferior vena cava is normal in size with greater than 50% respiratory variability, suggesting right atrial pressure of 3 mmHg. FINDINGS  Left Ventricle: Left ventricular ejection fraction, by estimation, is 40 to 45%. The left ventricle has mildly decreased function. The left ventricle demonstrates global hypokinesis. The left ventricular internal cavity size was normal in size. There is  moderate concentric left ventricular hypertrophy. Left ventricular diastolic parameters are consistent with Grade II diastolic dysfunction (pseudonormalization). Right Ventricle: The right ventricular size is normal. No increase in right ventricular wall thickness. Right ventricular systolic function is normal. Left Atrium: Left atrial size was normal in size. Right Atrium: Right atrial size was normal in size. Pericardium: There is no evidence of pericardial effusion. Presence of epicardial fat layer. Mitral Valve: The mitral valve is normal in structure. Mild to moderate mitral annular calcification. No evidence of mitral valve regurgitation. No evidence of mitral valve stenosis. Tricuspid Valve: The tricuspid valve is normal in structure. Tricuspid valve regurgitation is not  demonstrated. No evidence of tricuspid stenosis. Aortic Valve: Well seated bioprothsethic aortic valve. The aortic valve has been repaired/replaced. Aortic valve regurgitation is not visualized. No aortic stenosis is present. Aortic valve mean gradient measures 6.0 mmHg. Aortic valve peak gradient measures 11.0 mmHg. There is a 26 mm Sapien prosthetic, stented (TAVR) valve present in the aortic position. Procedure Date: 07/05/2021. Pulmonic Valve: The pulmonic valve was normal in structure. Pulmonic valve regurgitation is trivial. No evidence of pulmonic stenosis. Aorta: The aortic root is normal in size and structure. There is mild dilatation of the ascending aorta, measuring 38 mm. Venous:  The inferior vena cava is normal in size with greater than 50% respiratory variability, suggesting right atrial pressure of 3 mmHg. IAS/Shunts: No atrial level shunt detected by color flow Doppler.  LEFT VENTRICLE PLAX 2D LVIDd:         4.90 cm Diastology LVIDs:         3.50 cm LV e' medial:    6.53 cm/s LV PW:         1.30 cm LV E/e' medial:  19.6 LV IVS:        1.40 cm LV e' lateral:   7.80 cm/s                        LV E/e' lateral: 16.4  RIGHT VENTRICLE             IVC RV Basal diam:  2.50 cm     IVC diam: 1.30 cm RV S prime:     11.50 cm/s TAPSE (M-mode): 1.3 cm LEFT ATRIUM             Index        RIGHT ATRIUM           Index LA diam:        4.00 cm 2.26 cm/m   RA Area:     13.00 cm LA Vol (A2C):   38.8 ml 21.97 ml/m  RA Volume:   32.80 ml  18.57 ml/m LA Vol (A4C):   36.4 ml 20.61 ml/m LA Biplane Vol: 39.1 ml 22.14 ml/m  AORTIC VALVE                    PULMONIC VALVE AV Vmax:           165.67 cm/s  PV Vmax:       1.24 m/s AV Vmean:          114.500 cm/s PV Peak grad:  6.2 mmHg AV VTI:            0.248 m AV Peak Grad:      11.0 mmHg AV Mean Grad:      6.0 mmHg LVOT Vmax:         164.00 cm/s LVOT Vmean:        103.000 cm/s LVOT VTI:          0.231 m LVOT/AV VTI ratio: 0.93  AORTA Ao Root diam: 3.90 cm Ao Asc diam:  3.80 cm MITRAL VALVE MV Area (PHT): 4.65 cm     SHUNTS MV Decel Time: 163 msec     Systemic VTI: 0.23 m MV E velocity: 128.00 cm/s MV A velocity: 119.00 cm/s MV E/A ratio:  1.08 Kardie Tobb DO Electronically signed by Berniece Salines DO Signature Date/Time: 07/06/2021/11:28:27 AM    Final    CUP PACEART REMOTE DEVICE CHECK  Result Date: 07/04/2021 ILR summary report received. Battery status  OK. Normal device function. No new symptom, tachy, brady, or pause episodes. No new AF episodes. Monthly summary reports and ROV/PRN. LH  ECHOCARDIOGRAM LIMITED  Result Date: 07/05/2021    ECHOCARDIOGRAM LIMITED REPORT   Patient Name:   PERCIVAL GLASHEEN Mchugh Date of Exam: 07/05/2021 Medical Rec #:  [id number]       Height:       63.0 in Accession #:    [id number]     Weight:       163.0 lb Date of Birth:  27-Dec-1955       BSA:          1.773 m Patient Age:    60 years       BP:           187/110Severe aortic stenosis                                              [I35.0 (ICD-10-CM)] mmHg Patient Gender: M              HR:           64 bpm. Exam Location:  Inpatient Procedure: Limited Echo, Color Doppler and Cardiac Doppler Indications:     Severe aortic stenosis [I35.0 (ICD-10-CM)]  History:         Patient has prior history of Echocardiogram examinations, most                  recent 04/24/2021. CAD, COPD; Risk Factors:Diabetes,                  Hypertension and Dyslipidemia.                  Aortic Valve: 26 mm Sapien prosthetic, stented (TAVR) valve is                  present in the aortic position.  Sonographer:     Darlina Sicilian RDCS Referring Phys:  [place] Diagnosing Phys: Jenkins Rouge MD IMPRESSIONS  1. Septal apical and inferior lateral hypokinesis . Left ventricular ejection fraction, by estimation, is 40 to 45%. The left ventricle has mildly decreased function. The left ventricle demonstrates regional wall motion abnormalities (see scoring diagram/findings for description). The left ventricular internal cavity size was mildly dilated.  2. The mitral valve is abnormal. Trivial mitral valve regurgitation. Moderate mitral annular calcification.  3. Pre TAVR: severely calcified mean gradient 18 peak 29 supine in cath lab with low EF AVA 1.2 cm2         Post TAVR: well placed 26 mm Sapien 3 valve No PvL AVA 4.6 cm2 peak gradient 3 mean 1 mmhg . The aortic valve has been repaired/replaced. There is a 26 mm Sapien prosthetic (TAVR) valve present in the aortic position. FINDINGS  Left Ventricle: Septal apical and inferior lateral hypokinesis. Left ventricular ejection fraction, by estimation, is 40 to 45%. The left ventricle has mildly decreased function. The left ventricle demonstrates regional wall motion  abnormalities. The left ventricular internal cavity size was mildly dilated. Pericardium: There is no evidence of pericardial effusion. Mitral Valve: The mitral valve is abnormal. There is mild thickening of the mitral valve leaflet(s). There is mild calcification of the mitral valve leaflet(s). Moderate mitral annular calcification. Trivial mitral valve regurgitation. Aortic Valve: Pre TAVR: severely calcified mean gradient 18 peak 29 supine in cath  lab with low EF AVA 1.2 cm2 Post TAVR: well placed 26 mm Sapien 3 valve No PvL AVA 4.6 cm2 peak gradient 3 mean 1 mmhg. The aortic valve has been repaired/replaced. Aortic valve mean gradient measures 1.0 mmHg. Aortic valve peak gradient measures 3.3 mmHg. Aortic valve area, by VTI  measures 4.09 cm. There is a 26 mm Sapien prosthetic, stented (TAVR) valve present in the aortic position. LEFT VENTRICLE PLAX 2D LVOT diam:     2.43 cm LV SV:         71 LV SV Index:   40 LVOT Area:     4.65 cm  AORTIC VALVE AV Area (Vmax):    4.30 cm AV Area (Vmean):   4.32 cm AV Area (VTI):     4.09 cm AV Vmax:           90.50 cm/s AV Vmean:          52.600 cm/s AV VTI:            0.174 m AV Peak Grad:      3.3 mmHg AV Mean Grad:      1.0 mmHg LVOT Vmax:         83.60 cm/s LVOT Vmean:        48.900 cm/s LVOT VTI:          0.153 m LVOT/AV VTI ratio: 0.88  SHUNTS Systemic VTI:  0.15 m Systemic Diam: 2.43 cm Jenkins Rouge MD Electronically signed by Jenkins Rouge MD Signature Date/Time: 07/05/2021/2:07:16 PM    Final    Structural Heart Procedure  Result Date: 07/05/2021 See surgical note for result.    Assessment/Plan CAD (coronary artery disease) Status post coronary artery bypass graft without any current worrisome symptoms.   Diabetes ([place]) blood glucose control important in reducing the progression of atherosclerotic disease. Also, involved in wound healing. On appropriate medications.     Essential hypertension blood pressure control important in reducing the  progression of atherosclerotic disease. On appropriate oral medications.     History of stroke Etiology not entirely clear but his mild carotid disease would lean against this being from carotid disease.  Carotid stenosis Carotid duplex today reveals stable 1 to 39% ICA stenosis bilaterally.  Unlikely to be the cause of his previous stroke.  No change in medical regimen at this point.  No role for intervention at this degree of stenosis.  Recheck in 1 year    Leotis Pain, MD  07/12/2021 10:29 AM    This note was created with Dragon medical transcription system.  Any errors from dictation are purely unintentional

## 2021-07-14 DIAGNOSIS — I350 Nonrheumatic aortic (valve) stenosis: Secondary | ICD-10-CM | POA: Diagnosis not present

## 2021-07-14 NOTE — Progress Notes (Signed)
[facility]                                     Cardiology Office Note:    Date:  07/18/2021   ID:  Thomas Moore, DOB 08-07-1955, MRN [id number]  PCP:  Thomas Ruths, MD  [place] Cardiologist:  Thomas Spray, MD  [place] Electrophysiologist:  Thomas Peru, MD /Dr. Burt Knack, MD & Dr. Cyndia Bent, MD (TAVR)  Referring MD: No ref. provider found   Chief Complaint  Patient presents with   Follow-up    1 week s/p TAVR    History of Present Illness:    Thomas Moore is a 66 y.o. male with a hx of CAD s/p CABG 1997, DM2, seizure disorder, history of PE maintained on long-term oral anticoagulation with rivaroxaban, HTN, HLD, COPD, CVA, recent admission for chest pain/NSTEMI (hsTrop >10K) and ARF requiring intubation (cath showed stable coronary anatomy) and echo showed LFLG severe AS who underwent TAVR 07/05/21. He is here today for 1 week follow up.   Thomas Moore has a longstanding cardiac history with premature coronary artery disease treated with multivessel CABG in 1997 when he was 66 years old. He was referred to Dr. Burt Moore for the evaluation of severe aortic stenosis after a recent hospitalization at [facility] after presenting with left arm pain and chest discomfort in the setting of tachyarrhythmia. Cardiac catheterization demonstrated stable coronary anatomy with patency of the LIMA to LAD and saphenous vein graft to OM graft. Medical therapy was recommended. An echocardiogram during his hospitalization demonstrated findings concerning for low-flow low gradient aortic stenosis.    On evaluation, he reported that he lives alone with no local family. He has some support from friends and neighbors but this appeared to be fairly limited. He was previously followed in the [place] practice for many years by Dr. Ubaldo Moore and now is under the care of Dr. Corky Moore. The patient worked in the [employer] at [employer] for 18 years and also has worked in the [employer]. At the time of evaluation, he had symptoms of fatigue and mild exertional dyspnea, however chest pain or pressure. He had syncopal spells of unclear origin in the past, with history of seizure disorder. The patient has an implantable loop recorder that has demonstrated no arrhythmic etiology of syncope. He no longer drives a car.  He was evaluated by the multidisciplinary valve team, including Dr. Cyndia Moore and felt to have severe, symptomatic aortic stenosis and to be a suitable candidate for TAVR.   He underwent successful TAVR with a 26 mm Edwards Sapien 3UR via the TF approach on 07/05/21. Post operative echo with LVEF at 40-45% with global hypokinesis, G2DD, and well seated bioprothsethic aortic valve with mean gradient measures 44mHg. Xarelto was restarted prior to discharge.   Today Thomas Moore here alone. He states that overall he feels better after his procedure but cannot pin point exact symptom improvement. He denies chest pain, palpitations, SOB, LE edema, orthopnea, dizziness, or syncope. Groin sites are stable with no evidence of bleeding or hematoma. SBE discussed today with patient full understanding. Tolerating medications without issues. Biggest concern today seems to be his chronic back pain.   Past Medical History:  Diagnosis Date   CAD (coronary artery disease) of artery bypass graft    s/p CABG  x 4 in 1997   COPD (chronic obstructive pulmonary disease) (HCC)    Coronary artery disease    CVA (cerebral vascular accident) ([place])    Diabetes mellitus without complication ([place])    Dysrhythmia    GERD (gastroesophageal reflux disease)    HLD (hyperlipidemia)    Hypertension    S/P TAVR (transcatheter aortic valve replacement) 07/05/2021   with Edwards 62m S3UR via TF approach with Dr. CBurt Knackand Dr. BCyndia Moore  Seizures ([facility]     Past Surgical History:  Procedure Laterality Date   BYPASS  GRAFT ANGIOGRAPHY N/A 04/25/2021   Procedure: BYPASS GRAFT ANGIOGRAPHY;  Surgeon: Thomas Grime MD;  [facility];  Service: Cardiovascular;  Laterality: N/A;   CARDIAC SURGERY     CORNEAL TRANSPLANT Right    CORONARY ARTERY BYPASS GRAFT  1997   EYE SURGERY     FLEXOR TENDON REPAIR Left 07/11/2019   Procedure: FLEXOR tenolysis  REPAIR LEFT RING FINGER with tednon repair;  Surgeon: MHessie Knows MD;  Location: [facility] OR[facility];  Service: Orthopedics;  Laterality: Left;   INCISION AND DRAINAGE ABSCESS Left 05/08/2019   Procedure: INCISION AND DRAINAGE ABSCESS;  Surgeon: HDereck Leep MD;  Location: [facility] ORS;  Service: Orthopedics;  Laterality: Left;   INTRAOPERATIVE TRANSTHORACIC ECHOCARDIOGRAM N/A 07/05/2021   Procedure: INTRAOPERATIVE TRANSTHORACIC ECHOCARDIOGRAM;  Surgeon: CSherren Mocha MD;  Location: [facility]  Service: Open Heart Surgery;  Laterality: N/A;   LEFT HEART CATH AND CORS/GRAFTS ANGIOGRAPHY N/A 10/02/2019   Procedure: LEFT HEART CATH AND CORS/GRAFTS ANGIOGRAPHY;  Surgeon: HLeonie Man MD;  [facility];  Service: Cardiovascular;  Laterality: N/A;   LOOP RECORDER INSERTION N/A 10/06/2019   Procedure: LOOP RECORDER INSERTION;  Surgeon: TEvans Lance MD;  [facility];  Service: Cardiovascular;  Laterality: N/A;   TRANSCATHETER AORTIC VALVE REPLACEMENT, TRANSFEMORAL N/A 07/05/2021   Procedure: TRANSCATHETER AORTIC VALVE REPLACEMENT, TRANSFEMORAL;  Surgeon: CSherren Mocha MD;  [place] [facility]  Service: Open Heart Surgery;  Laterality: N/A;   TRIGGER FINGER RELEASE Left     Current Medications: Current Meds  Medication Sig   acetaminophen (TYLENOL) 650 MG CR tablet Take 1,300 mg by mouth every 8 (eight) hours as needed for pain.   amoxicillin (AMOXIL) 500 MG tablet TAKE 2000 MG (4 TABLETS) 1 HOUR BEFORE DENTAL PROCEDURES   atorvastatin (LIPITOR) 40 MG tablet TAKE ONE TABLET BY MOUTH AT BEDTIME   blood glucose meter kit  and supplies KIT Dispense based on patient and insurance preference. Use up to four times daily as directed. (FOR ICD-9 250.00, 250.01).   Calcium Citrate-Vitamin D (CALCIUM + D PO) Take 1 tablet by mouth 2 (two) times daily.   clopidogrel (PLAVIX) 75 MG tablet Take 1 tablet (75 mg total) by mouth daily.   divalproex (DEPAKOTE ER) 250 MG 24 hr tablet Take 250 mg by mouth 2 (two) times daily.   furosemide (LASIX) 20 MG tablet Take 1 tablet (20 mg total) by mouth daily.   glipiZIDE (GLUCOTROL XL) 10 MG 24 hr tablet TAKE ONE TABLET BY MOUTH 2 TIMES A DAY (Patient taking differently: Take 20 mg by mouth daily.)   glucose blood (PRECISION QID TEST) test strip 1 each (1 strip total) 2 (two) times daily Use as instructed.   ipratropium (ATROVENT) 0.06 % nasal Moore Place 2 sprays into both nostrils daily as needed for rhinitis.   isosorbide mononitrate (IMDUR) 30 MG 24 hr tablet Take 30 mg by mouth  daily.   metFORMIN (GLUCOPHAGE-XR) 500 MG 24 hr tablet TAKE TWO TABLETS BY MOUTH 2 TIMES A DAY   metoprolol succinate (TOPROL-XL) 100 MG 24 hr tablet Take 1 tablet (100 mg total) by mouth daily.   Multiple Vitamin (MULTIVITAMIN) capsule Take 1 capsule by mouth daily.   nitroGLYCERIN (NITROSTAT) 0.4 MG SL tablet Place 0.4 mg under the tongue every 5 (five) minutes as needed for chest pain.   pantoprazole (PROTONIX) 40 MG tablet Take 1 tablet (40 mg total) by mouth daily.   ramipril (ALTACE) 2.5 MG capsule Take 1 capsule (2.5 mg total) by mouth daily.   TRUE METRIX BLOOD GLUCOSE TEST test strip SMARTSIG:Via Meter   TRULICITY 2.80 KL/4.9ZP SOPN Inject 0.75 mg into the skin once a week. Wednesday (Patient taking differently: Inject 0.75 mg into the skin every Thursday.)   XARELTO 20 MG TABS tablet Take 20 mg by mouth daily with supper.     Allergies:   Patient has no known allergies.   Social History   Socioeconomic History   Marital status: Single    Spouse name: Not on file   Number of children: 0    Years of education: Not on file   Highest education level: High school graduate  Occupational History   Occupation: Unemployed    Comment: Trying to get disability  Tobacco Use   Smoking status: Former    Types: Cigarettes    Quit date: 1984    Years since quitting: 39.1   Smokeless tobacco: Never   Tobacco comments:    Quit 40 years ago  Electronics engineer Use   Vaping Use: Never used  Substance and Sexual Activity   Alcohol use: Not Currently   Drug use: Never   Sexual activity: Never  Other Topics Concern   Not on file  Social History Narrative   ** Merged History Encounter **    Lives in an apartment on [address], has to climb 17 floors   Social Determinants of Health   Financial Resource Strain: Not on file  Food Insecurity: Not on file  Transportation Needs: Not on file  Physical Activity: Not on file  Stress: Not on file  Social Connections: Not on file    Family History: The patient's family history includes Seizures in his brother.  ROS:   Please see the history of present illness.    All other systems reviewed and are negative.  EKGs/Labs/Other Studies Reviewed:    The following studies were reviewed today:  TAVR OPERATIVE NOTE     Date of Procedure:                07/05/2021   Preoperative Diagnosis:      Severe Aortic Stenosis    Postoperative Diagnosis:    Same    Procedure:        Transcatheter Aortic Valve Replacement - Percutaneous  Transfemoral Approach             Edwards Sapien 3 Resilia THV (size 26 mm, serial # [id number])              Co-Surgeons:                        Gaye Pollack, MD and Thomas Mocha, MD   Anesthesiologist:                  Adele Barthel, MD   Echocardiographer:  Jenkins Rouge, MD   Pre-operative Echo Findings: Severe aortic stenosis Mild left ventricular systolic dysfunction   Post-operative Echo Findings: No paravalvular leak Unchanged left ventricular systolic function   Echocardiogram 07/06/21:     1. Left ventricular ejection fraction, by estimation, is 40 to 45%. The  left ventricle has mildly decreased function. The left ventricle  demonstrates global hypokinesis. There is moderate concentric left  ventricular hypertrophy. Left ventricular  diastolic parameters are consistent with Grade II diastolic dysfunction  (pseudonormalization).   2. Right ventricular systolic function is normal. The right ventricular  size is normal.   3. The mitral valve is normal in structure. No evidence of mitral valve  regurgitation. No evidence of mitral stenosis.   4. Well seated bioprothsethic aortic valve. Aortic valve regurgitation is  not visualized. No aortic stenosis is present. There is a 26 mm Sapien  prosthetic (TAVR) valve present in the aortic position. Procedure Date:  07/05/2021. Aortic valve mean  gradient measures 6.0 mmHg. Aortic valve Vmax measures 1.66 m/s.   5. There is mild dilatation of the ascending aorta, measuring 38 mm.   6. The inferior vena cava is normal in size with greater than 50%  respiratory variability, suggesting right atrial pressure of 3 mmHg.    EKG:  EKG is ordered today.  The ekg ordered today demonstrates NSR with LBBB  Recent Labs: 04/23/2021: TSH 5.958 04/27/2021: Magnesium 1.8 07/01/2021: ALT 10; B Natriuretic Peptide 635.8 07/07/2021: BUN 12; Creatinine, Ser 1.14; Hemoglobin 10.2; Platelets 145; Potassium 4.2; Sodium 138   Recent Lipid Panel    Component Value Date/Time   CHOL 143 02/25/2020 1126   TRIG 181 (H) 04/24/2021 0651   HDL 31 (L) 02/25/2020 1126   CHOLHDL 4.6 02/25/2020 1126   CHOLHDL 4.7 09/30/2019 0248   VLDL 24 09/30/2019 0248   LDLCALC 78 02/25/2020 1126   Physical Exam:    VS:  BP 116/68 (BP Location: Left Arm, Patient Position: Sitting, Cuff Size: Normal)    Pulse 71    Ht '5\' 3"'  (1.6 m)    Wt 162 lb 6.4 oz (73.7 kg)    SpO2 98%    BMI 28.77 kg/m     Wt Readings from Last 3 Encounters:  07/15/21 162 lb 6.4 oz (73.7 kg)   07/12/21 160 lb (72.6 kg)  07/07/21 157 lb 9.6 oz (71.5 kg)    General: Well developed, well nourished, NAD Neck: Negative for carotid bruits. No JVD Lungs:Clear to ausculation bilaterally. Breathing is unlabored. Cardiovascular: RRR with S1 S2. Soft flow murmur Extremities: No edema. Neuro: Alert and oriented. No focal deficits. No facial asymmetry. MAE spontaneously. Psych: Responds to questions appropriately with normal affect.    ASSESSMENT/PLAN:    Severe AS: s/p successful TAVR with a 26 mm Edwards Sapien 3UR via the TF approach on 07/05/21. Post operative echo with LVEF at 40-45% with global hypokinesis, G2DD, and well seated bioprothsethic aortic valve with mean gradient measures 1mHg. Groin sites are stable. ECG with NSR and new bundle branch block noted however no symptoms of dizziness or syncope. Xarelto restarted one day post procedure. SBE with Amoxicillin discussed and RX'ed today.    Hx of AF: Rates elevated during hospital course however improved after restating PTA Toprol 1088m Continue Xarelto.    Chronic systolic CHF: Appears euvolemic on exam today. Could consider the addition of Entresto is BP and creatinine tolerates however will defer to primary cardiology team.    CAD s/p CABG: Recent LHC with stable CAD.  Continue current regimen although may need to address with concomitant Plavix and Xarelto. Will defer to primary cardiology team, Dr. Corky Moore.  No c/o chest pain.    HTN: Restart home Ramipril.    COPD: Stable with no current issues   CVA: No new neuro changes    Incidental findings: Enlarged mediastinal and hilar lymph nodes are stable to slightly decreased in size when compared with prior exam and likely reactive. No need to follow.     Medication Adjustments/Labs and Tests Ordered: Current medicines are reviewed at length with the patient today.  Concerns regarding medicines are outlined above.  Orders Placed This Encounter  Procedures   EKG 12-Lead    Meds ordered this encounter  Medications   amoxicillin (AMOXIL) 500 MG tablet    Sig: TAKE 2000 MG (4 TABLETS) 1 HOUR BEFORE DENTAL PROCEDURES    Dispense:  12 tablet    Refill:  6    Patient Instructions  Medication Instructions:  Your physician has recommended you make the following change in your medication:  START AMOXICILLIN  500 MG: TAKE 2000 MG (4 TABLETS) 1 HOUR BEFORE DENTAL PROCEDURES *If you need a refill on your cardiac medications before your next appointment, please call your pharmacy*   Lab Work: NONE If you have labs (blood work) drawn today and your tests are completely normal, you will receive your results only by: [place] (if you have MyChart) OR A paper copy in the mail If you have any lab test that is abnormal or we need to change your treatment, we will call you to review the results.   Testing/Procedures: NONE   Follow-Up: KEEP SCHEDULED FOLLOW-UP   Signed, Kathyrn Drown, NP  07/18/2021 8:48 AM    [facility]

## 2021-07-15 ENCOUNTER — Other Ambulatory Visit: Payer: Self-pay

## 2021-07-15 ENCOUNTER — Ambulatory Visit: Payer: Medicare HMO | Admitting: Cardiology

## 2021-07-15 VITALS — BP 116/68 | HR 71 | Ht 63.0 in | Wt 162.4 lb

## 2021-07-15 DIAGNOSIS — Z952 Presence of prosthetic heart valve: Secondary | ICD-10-CM | POA: Diagnosis not present

## 2021-07-15 DIAGNOSIS — I10 Essential (primary) hypertension: Secondary | ICD-10-CM

## 2021-07-15 DIAGNOSIS — I480 Paroxysmal atrial fibrillation: Secondary | ICD-10-CM | POA: Diagnosis not present

## 2021-07-15 DIAGNOSIS — I2510 Atherosclerotic heart disease of native coronary artery without angina pectoris: Secondary | ICD-10-CM

## 2021-07-15 MED ORDER — AMOXICILLIN 500 MG PO TABS: ORAL_TABLET | ORAL | 6 refills | Status: DC

## 2021-07-15 NOTE — Patient Instructions (Addendum)
Medication Instructions:  Your physician has recommended you make the following change in your medication:  START AMOXICILLIN  500 MG: TAKE 2000 MG (4 TABLETS) 1 HOUR BEFORE DENTAL PROCEDURES *If you need a refill on your cardiac medications before your next appointment, please call your pharmacy*   Lab Work: NONE If you have labs (blood work) drawn today and your tests are completely normal, you will receive your results only by: MyChart Message (if you have MyChart) OR A paper copy in the mail If you have any lab test that is abnormal or we need to change your treatment, we will call you to review the results.   Testing/Procedures: NONE   Follow-Up: KEEP SCHEDULED FOLLOW-UP

## 2021-07-21 DIAGNOSIS — R278 Other lack of coordination: Secondary | ICD-10-CM | POA: Diagnosis not present

## 2021-07-21 DIAGNOSIS — R29818 Other symptoms and signs involving the nervous system: Secondary | ICD-10-CM | POA: Diagnosis not present

## 2021-07-21 DIAGNOSIS — R569 Unspecified convulsions: Secondary | ICD-10-CM | POA: Diagnosis not present

## 2021-07-21 DIAGNOSIS — M25642 Stiffness of left hand, not elsewhere classified: Secondary | ICD-10-CM | POA: Diagnosis not present

## 2021-07-21 DIAGNOSIS — M79643 Pain in unspecified hand: Secondary | ICD-10-CM | POA: Diagnosis not present

## 2021-07-21 DIAGNOSIS — M79646 Pain in unspecified finger(s): Secondary | ICD-10-CM | POA: Diagnosis not present

## 2021-07-21 DIAGNOSIS — M25641 Stiffness of right hand, not elsewhere classified: Secondary | ICD-10-CM | POA: Diagnosis not present

## 2021-08-03 ENCOUNTER — Ambulatory Visit: Payer: Medicare HMO

## 2021-08-05 ENCOUNTER — Ambulatory Visit: Payer: Medicare HMO

## 2021-08-08 ENCOUNTER — Ambulatory Visit (INDEPENDENT_AMBULATORY_CARE_PROVIDER_SITE_OTHER): Payer: Medicare HMO

## 2021-08-08 DIAGNOSIS — I480 Paroxysmal atrial fibrillation: Secondary | ICD-10-CM | POA: Diagnosis not present

## 2021-08-08 LAB — CUP PACEART REMOTE DEVICE CHECK
Date Time Interrogation Session: 20230303230818
Implantable Pulse Generator Implant Date: 20210503

## 2021-08-09 NOTE — Progress Notes (Signed)
HE[facility] [place]                                     Cardiology Office Note:    Date:  08/11/2021   ID:  Thomas Moore, DOB June 03, 1956, MRN [id number]  PCP:  Kirk Ruths, MD  [facility] Cardiologist:  Teodoro Spray, MD / Dr. Corky Sox (Riccardo Dubin) [facility] Electrophysiologist:  Cristopher Peru, MD /Dr. Burt Knack, MD & Dr. Cyndia Bent, MD (TAVR)  Referring MD: No ref. provider found   1 month s/p TAVR  History of Present Illness:    Thomas Moore is a 66 y.o. male with a hx of CAD s/p CABG 1997, recurrent syncope s/p ILR (followed by Dr. Lovena Le), DMT2, seizure disorder, history of PE maintained on long-term oral anticoagulation with rivaroxaban, HTN, HLD, COPD, CVA, recent admission for chest pain/NSTEMI (hsTrop >10K) and ARF requiring intubation (cath showed stable coronary anatomy) and LFLG severe AS s/p TAVR 07/05/21 who presents to clinic for follow up.   Mr. Millspaugh has a longstanding cardiac history with premature coronary artery disease treated with multivessel CABG in 1997 when he was 66 years old. He was referred to Dr. Burt Knack for the evaluation of severe aortic stenosis after a recent hospitalization at [facility] after presenting with left arm pain and chest discomfort in the setting of tachyarrhythmia. Cardiac catheterization demonstrated stable coronary anatomy with patency of the LIMA to LAD and saphenous vein graft to OM graft. Medical therapy was recommended. An echocardiogram during admission demonstrated findings concerning for low-flow low gradient aortic stenosis. EF 45-50%, He had syncopal spells of unclear origin in the past, with history of seizure disorder. The patient has an implantable loop recorder that has demonstrated no arrhythmic etiology of syncope. He no longer drives a car.  He was evaluated by the multidisciplinary valve team and underwent successful TAVR with a 26 mm Edwards Sapien 3UR  via the TF approach on 07/05/21. Post operative echo showed LVEF at 40-45% with global hypokinesis, G2DD, and well seated bioprothsethic aortic valve with mean gradient measures 48mHg and no PVL. Home Plavix and Xarelto were restarted prior to discharge.   Today the patient presents to clinic for follow up. No CP or SOB. No LE edema, orthopnea or PND. No dizziness or syncope. No blood in stool or urine. No palpitations.   Past Medical History:  Diagnosis Date   CAD (coronary artery disease) of artery bypass graft    s/p CABG x 4 in 1997   COPD (chronic obstructive pulmonary disease) (HCC)    Coronary artery disease    [place]    Di[place]    Dysrhythmia    GERD (gastroesophageal reflux disease)    HLD (hyperlipidemia)    Hypertension    S/P TAVR (transcatheter aortic valve replacement) 07/05/2021   with Edwards 259mS3UR via TF approach with Dr. CoBurt Knacknd Dr[facility]    Past Surgical History:  Procedure Laterality Date   BYPASS GRAFT ANGIOGRAPHY N/A 04/25/2021   Procedure: BYPASS GRAFT ANGIOGRAPHY;  Surgeon: OrAndrez GrimeMD;  [facility];  Service: Cardiovascular;  Laterality: N/A;   CARDIAC SURGERY     CORNEAL TRANSPLANT Right    CORONARY ARTERY BYPASS GRAFT  1997   EYE SURGERY  FLEXOR TENDON REPAIR Left 07/11/2019   Procedure: FLEXOR tenolysis  REPAIR LEFT RING FINGER with tednon repair;  Surgeon: Hessie Knows, MD;  Location: [facility] ORS;  Service: Orthopedics;  Laterality: Left;   INCISION AND DRAINAGE ABSCESS Left 05/08/2019   Procedure: INCISION AND DRAINAGE ABSCESS;  Surgeon: Dereck Leep, MD;  Location: [facility] ORS;  Service: Orthopedics;  Laterality: Left;   INTRAOPERATIVE TRANSTHORACIC ECHOCARDIOGRAM N/A 07/05/2021   Procedure: INTRAOPERATIVE TRANSTHORACIC ECHOCARDIOGRAM;  Surgeon: Sherren Mocha, MD;  Location: [place];  Service: Open Heart Surgery;  Laterality:  N/A;   LEFT HEART CATH AND CORS/GRAFTS ANGIOGRAPHY N/A 10/02/2019   Procedure: LEFT HEART CATH AND CORS/GRAFTS ANGIOGRAPHY;  Surgeon: Leonie Man, MD;  Location: [place] CV LAB;  Service: Cardiovascular;  Laterality: N/A;   LOOP RECORDER INSERTION N/A 10/06/2019   Procedure: LOOP RECORDER INSERTION;  Surgeon: Evans Lance, MD;  Location: [place] CV LAB;  Service: Cardiovascular;  Laterality: N/A;   TRANSCATHETER AORTIC VALVE REPLACEMENT, TRANSFEMORAL N/A 07/05/2021   Procedure: TRANSCATHETER AORTIC VALVE REPLACEMENT, TRANSFEMORAL;  Surgeon: Sherren Mocha, MD;  Location: [place];  Service: Open Heart Surgery;  Laterality: N/A;   TRIGGER FINGER RELEASE Left     Current Medications: Current Meds  Medication Sig   acetaminophen (TYLENOL) 650 MG CR tablet Take 1,300 mg by mouth every 8 (eight) hours as needed for pain.   amoxicillin (AMOXIL) 500 MG tablet TAKE 2000 MG (4 TABLETS) 1 HOUR BEFORE DENTAL PROCEDURES   atorvastatin (LIPITOR) 40 MG tablet TAKE ONE TABLET BY MOUTH AT BEDTIME   blood glucose meter kit and supplies KIT Dispense based on patient and insurance preference. Use up to four times daily as directed. (FOR ICD-9 250.00, 250.01).   Calcium Citrate-Vitamin D (CALCIUM + D PO) Take 1 tablet by mouth 2 (two) times daily.   clopidogrel (PLAVIX) 75 MG tablet Take 1 tablet (75 mg total) by mouth daily.   divalproex (DEPAKOTE ER) 250 MG 24 hr tablet Take 250 mg by mouth 2 (two) times daily.   divalproex (DEPAKOTE) 500 MG DR tablet Take 1 tablet (500 mg total) by mouth at bedtime.   furosemide (LASIX) 20 MG tablet Take 1 tablet (20 mg total) by mouth daily.   glipiZIDE (GLUCOTROL XL) 10 MG 24 hr tablet TAKE ONE TABLET BY MOUTH 2 TIMES A DAY   glucose blood (PRECISION QID TEST) test strip 1 each (1 strip total) 2 (two) times daily Use as instructed.   ipratropium (ATROVENT) 0.06 % nasal spray Place 2 sprays into both nostrils daily as needed for rhinitis.   isosorbide  mononitrate (IMDUR) 30 MG 24 hr tablet Take 30 mg by mouth daily.   metFORMIN (GLUCOPHAGE-XR) 500 MG 24 hr tablet TAKE TWO TABLETS BY MOUTH 2 TIMES A DAY   metoprolol succinate (TOPROL-XL) 100 MG 24 hr tablet Take 1 tablet (100 mg total) by mouth daily.   Multiple Vitamin (MULTIVITAMIN) capsule Take 1 capsule by mouth daily.   nitroGLYCERIN (NITROSTAT) 0.4 MG SL tablet Place 0.4 mg under the tongue every 5 (five) minutes as needed for chest pain.   pantoprazole (PROTONIX) 40 MG tablet Take 1 tablet (40 mg total) by mouth daily.   ramipril (ALTACE) 2.5 MG capsule Take 1 capsule (2.5 mg total) by mouth daily.   TRUE METRIX BLOOD GLUCOSE TEST test strip SMARTSIG:Via Meter   TRULICITY 2.40 XB/3.5HG SOPN Inject 0.75 mg into the skin once a week. Wednesday   XARELTO 20 MG TABS tablet Take 20 mg by mouth  daily with supper.     Allergies:   Patient has no known allergies.   Social History   Socioeconomic History   Marital status: Single    Spouse name: Not on file   Number of children: 0   Years of education: Not on file   Highest education level: High school graduate  Occupational History   Occupation: Unemployed    Comment: Trying to get disability  Tobacco Use   Smoking status: Former    Types: Cigarettes    Quit date: 1984    Years since quitting: 39.2   Smokeless tobacco: Never   Tobacco comments:    Quit 40 years ago  Vaping Use   Vaping Use: Never used  Substance and Sexual Activity   Alcohol use: Not Currently   Drug use: Never   Sexual activity: Never  Other Topics Concern   Not on file  Social History Narrative   ** Merged History Encounter **    Lives in an apartment on [address], has to climb 17 floors   Social Determinants of Health   Financial Resource Strain: Not on file  Food Insecurity: Not on file  Transportation Needs: Not on file  Physical Activity: Not on file  Stress: Not on file  Social Connections: Not on file    Family History: The patient's  family history includes Seizures in his brother.  ROS:   Please see the history of present illness.    All other systems reviewed and are negative.  EKGs/Labs/Other Studies Reviewed:    The following studies were reviewed today:  TAVR OPERATIVE NOTE     Date of Procedure:                07/05/2021   Preoperative Diagnosis:      Severe Aortic Stenosis    Postoperative Diagnosis:    Same    Procedure:        Transcatheter Aortic Valve Replacement - Percutaneous  Transfemoral Approach             Edwards Sapien 3 Resilia THV (size 26 mm, serial # [id number])              Co-Surgeons:                        Gaye Pollack, MD and Sherren Mocha, MD   Anesthesiologist:                  Adele Barthel, MD   Echocardiographer:              Jenkins Rouge, MD   Pre-operative Echo Findings: Severe aortic stenosis Mild left ventricular systolic dysfunction   Post-operative Echo Findings: No paravalvular leak Unchanged left ventricular systolic function    ____________________   Echocardiogram 07/06/21:   1. Left ventricular ejection fraction, by estimation, is 40 to 45%. The  left ventricle has mildly decreased function. The left ventricle  demonstrates global hypokinesis. There is moderate concentric left  ventricular hypertrophy. Left ventricular  diastolic parameters are consistent with Grade II diastolic dysfunction  (pseudonormalization).   2. Right ventricular systolic function is normal. The right ventricular  size is normal.   3. The mitral valve is normal in structure. No evidence of mitral valve  regurgitation. No evidence of mitral stenosis.   4. Well seated bioprothsethic aortic valve. Aortic valve regurgitation is  not visualized. No aortic stenosis is present. There is a 26 mm Sapien  prosthetic (  TAVR) valve present in the aortic position. Procedure Date:  07/05/2021. Aortic valve mean  gradient measures 6.0 mmHg. Aortic valve Vmax measures 1.66 m/s.   5. There is  mild dilatation of the ascending aorta, measuring 38 mm.   6. The inferior vena cava is normal in size with greater than 50%  respiratory variability, suggesting right atrial pressure of 3 mmHg.   ______________________  Echo 08/10/21 IMPRESSIONS   1. Left ventricular ejection fraction, by estimation, is 40 to 45%. The  left ventricle has mildly decreased function. The left ventricle  demonstrates global hypokinesis. There is mild concentric left ventricular  hypertrophy. Left ventricular diastolic  parameters are consistent with Grade II diastolic dysfunction  (pseudonormalization). Elevated left atrial pressure.   2. Right ventricular systolic function is low normal. The right  ventricular size is normal.   3. Left atrial size was mildly dilated.   4. The mitral valve is normal in structure. Trivial mitral valve  regurgitation. No evidence of mitral stenosis.   5. The aortic valve has been repaired/replaced. Aortic valve  regurgitation is not visualized. There is a 26 mm Edwards Ultra, stented  (TAVR) valve present in the aortic position. Procedure Date: 07/05/2021.  Echo findings are consistent with normal  structure and function of the aortic valve prosthesis. Aortic valve mean  gradient measures 5.5 mmHg. Aortic valve Vmax measures 1.54 m/s. Aortic  valve acceleration time measures 92 msec.   6. The inferior vena cava is normal in size with greater than 50%  respiratory variability, suggesting right atrial pressure of 3 mmHg.   7. Cannot exclude a small PFO. There is a patent foramen ovale with  predominantly left to right shunting across the atrial septum.   Comparison(s): No significant change from prior study. Prior images  reviewed side by side.    EKG:  EKG is NOT ordered today.  Recent Labs: 04/23/2021: TSH 5.958 04/27/2021: Magnesium 1.8 07/01/2021: ALT 10; B Natriuretic Peptide 635.8 07/07/2021: BUN 12; Creatinine, Ser 1.14; Hemoglobin 10.2; Platelets 145; Potassium  4.2; Sodium 138   Recent Lipid Panel    Component Value Date/Time   CHOL 143 02/25/2020 1126   TRIG 181 (H) 04/24/2021 0651   HDL 31 (L) 02/25/2020 1126   CHOLHDL 4.6 02/25/2020 1126   CHOLHDL 4.7 09/30/2019 0248   VLDL 24 09/30/2019 0248   LDLCALC 78 02/25/2020 1126   Physical Exam:    VS:  BP 136/76    Pulse 72    Ht '5\' 3"'  (1.6 m)    Wt 163 lb 6.4 oz (74.1 kg)    SpO2 95%    BMI 28.95 kg/m     Wt Readings from Last 3 Encounters:  08/10/21 163 lb 6.4 oz (74.1 kg)  07/15/21 162 lb 6.4 oz (73.7 kg)  07/12/21 160 lb (72.6 kg)    General: Well developed, well nourished, NAD Neck: Negative for carotid bruits. No JVD Lungs:Clear to ausculation bilaterally. Breathing is unlabored. Cardiovascular: RRR with S1 S2. Soft flow murmur Extremities: No edema. Neuro: Alert and oriented. No focal deficits. No facial asymmetry. MAE spontaneously. Psych: Responds to questions appropriately with normal affect.    ASSESSMENT/PLAN:    Severe AS s/p TAVR:  echo today shows EF 40-45%, normally functioning TAVR with a mean gradient of 5.5 mm hg and no PVL. He has NYHA class I symptoms. Continue on Xarelto and Plavix for at least 12 months per his primary cardiologist, Dr. Corky Sox. He has Amoxicillin for SBE prophylaxis. I will see  him back in 1 year with an echo. Seeing Dr. Corky Sox back on 4/26.   PAF  Continue on Xarelto.    Chronic combined S/D CHF: EF 40-45%. He appears euvolemic. Continue on Lasix 16m daily.    CAD s/p CABG: recent LHC with stable CAD. He has been advised to continue Xarelto and Plavix for at least 12 months by his primary cardiologist, Dr. OCorky Sox Continue plavix, statin and BB.    HTN: Bp well controlled. No changes made.    COPD: stable with no current issues   HX of CVA: No new neuro changes. Continue statin and plavix.    Medication Adjustments/Labs and Tests Ordered: Current medicines are reviewed at length with the patient today.  Concerns regarding medicines are  outlined above.  No orders of the defined types were placed in this encounter.  No orders of the defined types were placed in this encounter.   Patient Instructions  Medication Instructions:  Your physician recommends that you continue on your current medications as directed. Please refer to the Current Medication list given to you today.  *If you need a refill on your cardiac medications before your next appointment, please call your pharmacy*   Lab Work: None ordered  If you have labs (blood work) drawn today and your tests are completely normal, you will receive your results only by: [place](if you have MyChart) OR A paper copy in the mail If you have any lab test that is abnormal or we need to change your treatment, we will call you to review the results.   Testing/Procedures: None ordered today    Follow-Up: Follow up as scheduled    Other Instructions None ordered     Signed, KAngelena Form PA-C  08/11/2021 10:53 AM    [facility]

## 2021-08-10 ENCOUNTER — Ambulatory Visit: Payer: Medicare HMO | Admitting: Physician Assistant

## 2021-08-10 ENCOUNTER — Other Ambulatory Visit: Payer: Self-pay | Admitting: Physician Assistant

## 2021-08-10 ENCOUNTER — Other Ambulatory Visit: Payer: Self-pay

## 2021-08-10 ENCOUNTER — Ambulatory Visit (HOSPITAL_COMMUNITY): Payer: Medicare HMO | Attending: Cardiovascular Disease

## 2021-08-10 VITALS — BP 136/76 | HR 72 | Ht 63.0 in | Wt 163.4 lb

## 2021-08-10 DIAGNOSIS — Z952 Presence of prosthetic heart valve: Secondary | ICD-10-CM

## 2021-08-10 DIAGNOSIS — Z8673 Personal history of transient ischemic attack (TIA), and cerebral infarction without residual deficits: Secondary | ICD-10-CM

## 2021-08-10 DIAGNOSIS — I2510 Atherosclerotic heart disease of native coronary artery without angina pectoris: Secondary | ICD-10-CM

## 2021-08-10 DIAGNOSIS — I480 Paroxysmal atrial fibrillation: Secondary | ICD-10-CM

## 2021-08-10 DIAGNOSIS — J449 Chronic obstructive pulmonary disease, unspecified: Secondary | ICD-10-CM | POA: Diagnosis not present

## 2021-08-10 LAB — ECHOCARDIOGRAM COMPLETE
AR max vel: 1.75 cm2
AV Area VTI: 1.68 cm2
AV Area mean vel: 1.52 cm2
AV Mean grad: 5.5 mmHg
AV Peak grad: 9.4 mmHg
Ao pk vel: 1.54 m/s
Area-P 1/2: 3.33 cm2
S' Lateral: 3 cm

## 2021-08-10 NOTE — Patient Instructions (Signed)
Medication Instructions:  ?Your physician recommends that you continue on your current medications as directed. Please refer to the Current Medication list given to you today. ? ?*If you need a refill on your cardiac medications before your next appointment, please call your pharmacy* ? ? ?Lab Work: ?None ordered ? ?If you have labs (blood work) drawn today and your tests are completely normal, you will receive your results only by: ?MyChart Message (if you have MyChart) OR ?A paper copy in the mail ?If you have any lab test that is abnormal or we need to change your treatment, we will call you to review the results. ? ? ?Testing/Procedures: ?None ordered today  ? ? ?Follow-Up: ?Follow up as scheduled  ? ? ?Other Instructions ?None ordered   ?

## 2021-08-16 NOTE — Progress Notes (Signed)
Carelink Summary Report / Loop Recorder 

## 2021-08-17 ENCOUNTER — Ambulatory Visit: Payer: Medicare HMO | Admitting: Cardiology

## 2021-08-17 ENCOUNTER — Other Ambulatory Visit: Payer: Self-pay

## 2021-08-17 ENCOUNTER — Encounter: Payer: Self-pay | Admitting: Cardiology

## 2021-08-17 VITALS — BP 140/78 | HR 80 | Ht 63.0 in | Wt 167.1 lb

## 2021-08-17 DIAGNOSIS — I350 Nonrheumatic aortic (valve) stenosis: Secondary | ICD-10-CM | POA: Diagnosis not present

## 2021-08-17 DIAGNOSIS — R4020 Unspecified coma: Secondary | ICD-10-CM

## 2021-08-17 DIAGNOSIS — I472 Ventricular tachycardia, unspecified: Secondary | ICD-10-CM

## 2021-08-17 DIAGNOSIS — Z01812 Encounter for preprocedural laboratory examination: Secondary | ICD-10-CM

## 2021-08-17 DIAGNOSIS — Z952 Presence of prosthetic heart valve: Secondary | ICD-10-CM | POA: Diagnosis not present

## 2021-08-17 DIAGNOSIS — R55 Syncope and collapse: Secondary | ICD-10-CM

## 2021-08-17 DIAGNOSIS — I2510 Atherosclerotic heart disease of native coronary artery without angina pectoris: Secondary | ICD-10-CM | POA: Diagnosis not present

## 2021-08-17 NOTE — H&P (View-Only) (Signed)
?Electrophysiology Office Note:   ? ?Date:  08/17/2021  ? ?ID:  Thomas Moore, DOB 15-Feb-1956, MRN [id number] ? ?PCP:  Kirk Ruths, MD  ?[facility] Cardiologist:  Teodoro Spray, MD  ?[place] Electrophysiologist:  Vickie Epley, MD  ? ?Referring MD: Kirk Ruths, MD  ? ?Chief Complaint: Ventricular tachycardia and syncope ? ?History of Present Illness:   ? ?Thomas Moore is a 67 y.o. male who presents for an evaluation of ventricular tachycardia and syncope at the request of Dr. Ouida Sills. Their medical history includes coronary artery disease post four-vessel bypass surgery 1997, COPD, CVA, severe aortic stenosis post TAVR in January 2023 and history of seizures.  The patient has a history of syncope and seizures.  He has had multiple episodes where he would lose consciousness abruptly.  He had a loop recorder implanted as part of the evaluation.  Recent loop recorder interrogation showed a wide-complex tachycardia with a rapid ventricular cycle length.  Review of the strips suggest ventricular tachycardia.  He presents to clinic today to discuss evaluation and treatment. ? ? ?  ?Past Medical History:  ?Diagnosis Date  ? CAD (coronary artery disease) of artery bypass graft   ? s/p CABG x 4 in 1997  ? COPD (chronic obstructive pulmonary disease) ([place])   ? Coronary artery disease   ? CVA (cerebral vascular accident) [facility])   ? Diabetes mellitus without complication ([place])   ? Dysrhythmia   ? GERD (gastroesophageal reflux disease)   ? HLD (hyperlipidemia)   ? Hypertension   ? S/P TAVR (transcatheter aortic valve replacement) 07/05/2021  ? with Edwards 46m S3UR via TF approach with Dr. CBurt Knackand Dr. BCyndia Bent ? Seizures ([place]   ? ? ?Past Surgical History:  ?Procedure Laterality Date  ? BYPASS GRAFT ANGIOGRAPHY N/A 04/25/2021  ? Procedure: BYPASS GRAFT ANGIOGRAPHY;  Surgeon: OAndrez Grime MD;  [place] [facility];  Service: Cardiovascular;  Laterality: N/A;  ? CARDIAC  SURGERY    ? CORNEAL TRANSPLANT Right   ? CORONARY ARTERY BYPASS GRAFT  1997  ? EYE SURGERY    ? FLEXOR TENDON REPAIR Left 07/11/2019  ? Procedure: FLEXOR tenolysis  REPAIR LEFT RING FINGER with tednon repair;  Surgeon: MHessie Knows MD;  Location: [facility] ORS;  Service: Orthopedics;  Laterality: Left;  ? INCISION AND DRAINAGE ABSCESS Left 05/08/2019  ? Procedure: INCISION AND DRAINAGE ABSCESS;  Surgeon: HDereck Leep MD;  Location: [facility];  Service: Orthopedics;  Laterality: Left;  ? INTRAOPERATIVE TRANSTHORACIC ECHOCARDIOGRAM N/A 07/05/2021  ? Procedure: INTRAOPERATIVE TRANSTHORACIC ECHOCARDIOGRAM;  Surgeon: CSherren Mocha MD;  [place]  Service: Open Heart Surgery;  Laterality: N/A;  ? LEFT HEART CATH AND CORS/GRAFTS ANGIOGRAPHY N/A 10/02/2019  ? Procedure: LEFT HEART CATH AND CORS/GRAFTS ANGIOGRAPHY;  Surgeon: HLeonie Man MD;  [place] [facility];  Service: Cardiovascular;  Laterality: N/A;  ? LOOP RECORDER INSERTION N/A 10/06/2019  ? Procedure: LOOP RECORDER INSERTION;  Surgeon: TEvans Lance MD;  [place] [facility];  Service: Cardiovascular;  Laterality: N/A;  ? TRANSCATHETER AORTIC VALVE REPLACEMENT, TRANSFEMORAL N/A 07/05/2021  ? Procedure: TRANSCATHETER AORTIC VALVE REPLACEMENT, TRANSFEMORAL;  Surgeon: CSherren Mocha MD;  [place]  Service: Open Heart Surgery;  Laterality: N/A;  ? TRIGGER FINGER RELEASE Left   ? ? ?Current Medications: ?Current Meds  ?Medication Sig  ? acetaminophen (TYLENOL) 650 MG CR tablet Take 1,300 mg by mouth every 8 (eight) hours as needed for pain.  ?  amoxicillin (AMOXIL) 500 MG tablet TAKE 2000 MG (4 TABLETS) 1 HOUR BEFORE DENTAL PROCEDURES  ? atorvastatin (LIPITOR) 40 MG tablet TAKE ONE TABLET BY MOUTH AT BEDTIME  ? blood glucose meter kit and supplies KIT Dispense based on patient and insurance preference. Use up to four times daily as directed. (FOR ICD-9 250.00, 250.01).  ? Calcium Citrate-Vitamin D (CALCIUM + D PO) Take 1 tablet  by mouth 2 (two) times daily.  ? clopidogrel (PLAVIX) 75 MG tablet Take 1 tablet (75 mg total) by mouth daily.  ? cyclobenzaprine (FLEXERIL) 5 MG tablet Take 5 mg by mouth 3 (three) times daily as needed.  ? divalproex (DEPAKOTE ER) 250 MG 24 hr tablet Take 250 mg by mouth 2 (two) times daily.  ? divalproex (DEPAKOTE) 500 MG DR tablet Take 1 tablet (500 mg total) by mouth at bedtime.  ? furosemide (LASIX) 20 MG tablet Take 1 tablet (20 mg total) by mouth daily.  ? glipiZIDE (GLUCOTROL XL) 10 MG 24 hr tablet TAKE ONE TABLET BY MOUTH 2 TIMES A DAY  ? glucose blood (PRECISION QID TEST) test strip 1 each (1 strip total) 2 (two) times daily Use as instructed.  ? ipratropium (ATROVENT) 0.06 % nasal spray Place 2 sprays into both nostrils daily as needed for rhinitis.  ? isosorbide mononitrate (IMDUR) 30 MG 24 hr tablet Take 30 mg by mouth daily.  ? metFORMIN (GLUCOPHAGE-XR) 500 MG 24 hr tablet TAKE TWO TABLETS BY MOUTH 2 TIMES A DAY  ? metoprolol succinate (TOPROL-XL) 100 MG 24 hr tablet Take 1 tablet (100 mg total) by mouth daily.  ? Multiple Vitamin (MULTIVITAMIN) capsule Take 1 capsule by mouth daily.  ? nitroGLYCERIN (NITROSTAT) 0.4 MG SL tablet Place 0.4 mg under the tongue every 5 (five) minutes as needed for chest pain.  ? pantoprazole (PROTONIX) 40 MG tablet Take 1 tablet (40 mg total) by mouth daily.  ? ramipril (ALTACE) 2.5 MG capsule Take 1 capsule (2.5 mg total) by mouth daily.  ? TRUE METRIX BLOOD GLUCOSE TEST test strip SMARTSIG:Via Meter  ? TRULICITY 7.91 TA/5.6PV SOPN Inject 0.75 mg into the skin once a week. Wednesday  ? XARELTO 20 MG TABS tablet Take 20 mg by mouth daily with supper.  ?  ? ?Allergies:   Patient has no known allergies.  ? ?Social History  ? ?Socioeconomic History  ? Marital status: Single  ?  Spouse name: Not on file  ? Number of children: 0  ? Years of education: Not on file  ? Highest education level: High school graduate  ?Occupational History  ? Occupation: Unemployed  ?  Comment:  Trying to get disability  ?Tobacco Use  ? Smoking status: Former  ?  Types: Cigarettes  ?  Quit date: 67  ?  Years since quitting: 39.2  ? Smokeless tobacco: Never  ? Tobacco comments:  ?  Quit 40 years ago  ?Vaping Use  ? Vaping Use: Never used  ?Substance and Sexual Activity  ? Alcohol use: Not Currently  ? Drug use: Never  ? Sexual activity: Never  ?Other Topics Concern  ? Not on file  ?Social History Narrative  ? ** Merged History Encounter **  ?  Lives in an apartment on [address], has to climb 17 floors  ? ?Social Determinants of Health  ? ?Financial Resource Strain: Not on file  ?Food Insecurity: Not on file  ?Transportation Needs: Not on file  ?Physical Activity: Not on file  ?Stress: Not on file  ?  Social Connections: Not on file  ?  ? ?Family History: ?The patient's family history includes Seizures in his brother. ? ?ROS:   ?Please see the history of present illness.    ?All other systems reviewed and are negative. ? ?EKGs/Labs/Other Studies Reviewed:   ? ?The following studies were reviewed today: ? ?August 05, 2021 Loop recorder interrogation personally reviewed ? ?Nonsustained ventricular tachycardia with a ventricular cycle length of about 320 ms. ? ?EKG:  The ekg ordered today demonstrates sinus rhythm with frequent monomorphic PVCs.  PVCs have a right superior axis and are negative throughout the precordium. ? ? ?Recent Labs: ?04/23/2021: TSH 5.958 ?04/27/2021: Magnesium 1.8 ?07/01/2021: ALT 10; B Natriuretic Peptide 635.8 ?07/07/2021: BUN 12; Creatinine, Ser 1.14; Hemoglobin 10.2; Platelets 145; Potassium 4.2; Sodium 138  ?Recent Lipid Panel ?   ?Component Value Date/Time  ? CHOL 143 02/25/2020 1126  ? TRIG 181 (H) 04/24/2021 0160  ? HDL 31 (L) 02/25/2020 1126  ? CHOLHDL 4.6 02/25/2020 1126  ? CHOLHDL 4.7 09/30/2019 0248  ? VLDL 24 09/30/2019 0248  ? [place] 78 02/25/2020 1126  ? ? ?Physical Exam:   ? ?VS:  BP 140/78 (BP Location: Left Arm, Patient Position: Sitting, Cuff Size: Normal)   Pulse 80   Ht  _0  (1.6 m)   Wt 167 lb 2 oz (75.8 kg)   SpO2 98%   BMI 29.60 kg/m?    ? ?Wt Readings from Last 3 Encounters:  ?08/17/21 167 lb 2 oz (75.8 kg)  ?08/10/21 163 lb 6.4 oz (74.1 kg)  ?07/15/21 162 lb 6.4 oz (73.

## 2021-08-17 NOTE — Progress Notes (Signed)
?Electrophysiology Office Note:   ? ?Date:  08/17/2021  ? ?ID:  MANOLITO JUREWICZ, DOB 15-Feb-1956, MRN [id number] ? ?PCP:  Kirk Ruths, MD  ?[facility] Cardiologist:  Teodoro Spray, MD  ?[place] Electrophysiologist:  Vickie Epley, MD  ? ?Referring MD: Kirk Ruths, MD  ? ?Chief Complaint: Ventricular tachycardia and syncope ? ?History of Present Illness:   ? ?Thomas Moore is a 66 y.o. male who presents for an evaluation of ventricular tachycardia and syncope at the request of Dr. Ouida Sills. Their medical history includes coronary artery disease post four-vessel bypass surgery 1997, COPD, CVA, severe aortic stenosis post TAVR in January 2023 and history of seizures.  The patient has a history of syncope and seizures.  He has had multiple episodes where he would lose consciousness abruptly.  He had a loop recorder implanted as part of the evaluation.  Recent loop recorder interrogation showed a wide-complex tachycardia with a rapid ventricular cycle length.  Review of the strips suggest ventricular tachycardia.  He presents to clinic today to discuss evaluation and treatment. ? ? ?  ?Past Medical History:  ?Diagnosis Date  ? CAD (coronary artery disease) of artery bypass graft   ? s/p CABG x 4 in 1997  ? COPD (chronic obstructive pulmonary disease) ([place])   ? Coronary artery disease   ? CVA (cerebral vascular accident) [facility])   ? Diabetes mellitus without complication ([place])   ? Dysrhythmia   ? GERD (gastroesophageal reflux disease)   ? HLD (hyperlipidemia)   ? Hypertension   ? S/P TAVR (transcatheter aortic valve replacement) 07/05/2021  ? with Edwards 46m S3UR via TF approach with Dr. CBurt Knackand Dr. BCyndia Bent ? Seizures ([place]   ? ? ?Past Surgical History:  ?Procedure Laterality Date  ? BYPASS GRAFT ANGIOGRAPHY N/A 04/25/2021  ? Procedure: BYPASS GRAFT ANGIOGRAPHY;  Surgeon: OAndrez Grime MD;  [place];  Service: Cardiovascular;  Laterality: N/A;  ? CARDIAC  SURGERY    ? CORNEAL TRANSPLANT Right   ? CORONARY ARTERY BYPASS GRAFT  1997  ? EYE SURGERY    ? FLEXOR TENDON REPAIR Left 07/11/2019  ? Procedure: FLEXOR tenolysis  REPAIR LEFT RING FINGER with tednon repair;  Surgeon: MHessie Knows MD;  Location: [facility] OR[facility];  Service: Orthopedics;  Laterality: Left;  ? INCISION AND DRAINAGE ABSCESS Left 05/08/2019  ? Procedure: INCISION AND DRAINAGE ABSCESS;  Surgeon: HDereck Leep MD;  Location: [facility] OR[facility];  Service: Orthopedics;  Laterality: Left;  ? INTRAOPERATIVE TRANSTHORACIC ECHOCARDIOGRAM N/A 07/05/2021  ? Procedure: INTRAOPERATIVE TRANSTHORACIC ECHOCARDIOGRAM;  Surgeon: CSherren Mocha MD;  [place] [facility]  Service: Open Heart Surgery;  Laterality: N/A;  ? LEFT HEART CATH AND CORS/GRAFTS ANGIOGRAPHY N/A 10/02/2019  ? Procedure: LEFT HEART CATH AND CORS/GRAFTS ANGIOGRAPHY;  Surgeon: HLeonie Man MD;  [facility];  Service: Cardiovascular;  Laterality: N/A;  ? LOOP RECORDER INSERTION N/A 10/06/2019  ? Procedure: LOOP RECORDER INSERTION;  Surgeon: TEvans Lance MD;  [facility];  Service: Cardiovascular;  Laterality: N/A;  ? TRANSCATHETER AORTIC VALVE REPLACEMENT, TRANSFEMORAL N/A 07/05/2021  ? Procedure: TRANSCATHETER AORTIC VALVE REPLACEMENT, TRANSFEMORAL;  Surgeon: CSherren Mocha MD;  [place] [facility]  Service: Open Heart Surgery;  Laterality: N/A;  ? TRIGGER FINGER RELEASE Left   ? ? ?Current Medications: ?Current Meds  ?Medication Sig  ? acetaminophen (TYLENOL) 650 MG CR tablet Take 1,300 mg by mouth every 8 (eight) hours as needed for pain.  ?  amoxicillin (AMOXIL) 500 MG tablet TAKE 2000 MG (4 TABLETS) 1 HOUR BEFORE DENTAL PROCEDURES  ? atorvastatin (LIPITOR) 40 MG tablet TAKE ONE TABLET BY MOUTH AT BEDTIME  ? blood glucose meter kit and supplies KIT Dispense based on patient and insurance preference. Use up to four times daily as directed. (FOR ICD-9 250.00, 250.01).  ? Calcium Citrate-Vitamin D (CALCIUM + D PO) Take 1 tablet  by mouth 2 (two) times daily.  ? clopidogrel (PLAVIX) 75 MG tablet Take 1 tablet (75 mg total) by mouth daily.  ? cyclobenzaprine (FLEXERIL) 5 MG tablet Take 5 mg by mouth 3 (three) times daily as needed.  ? divalproex (DEPAKOTE ER) 250 MG 24 hr tablet Take 250 mg by mouth 2 (two) times daily.  ? divalproex (DEPAKOTE) 500 MG DR tablet Take 1 tablet (500 mg total) by mouth at bedtime.  ? furosemide (LASIX) 20 MG tablet Take 1 tablet (20 mg total) by mouth daily.  ? glipiZIDE (GLUCOTROL XL) 10 MG 24 hr tablet TAKE ONE TABLET BY MOUTH 2 TIMES A DAY  ? glucose blood (PRECISION QID TEST) test strip 1 each (1 strip total) 2 (two) times daily Use as instructed.  ? ipratropium (ATROVENT) 0.06 % nasal spray Place 2 sprays into both nostrils daily as needed for rhinitis.  ? isosorbide mononitrate (IMDUR) 30 MG 24 hr tablet Take 30 mg by mouth daily.  ? metFORMIN (GLUCOPHAGE-XR) 500 MG 24 hr tablet TAKE TWO TABLETS BY MOUTH 2 TIMES A DAY  ? metoprolol succinate (TOPROL-XL) 100 MG 24 hr tablet Take 1 tablet (100 mg total) by mouth daily.  ? Multiple Vitamin (MULTIVITAMIN) capsule Take 1 capsule by mouth daily.  ? nitroGLYCERIN (NITROSTAT) 0.4 MG SL tablet Place 0.4 mg under the tongue every 5 (five) minutes as needed for chest pain.  ? pantoprazole (PROTONIX) 40 MG tablet Take 1 tablet (40 mg total) by mouth daily.  ? ramipril (ALTACE) 2.5 MG capsule Take 1 capsule (2.5 mg total) by mouth daily.  ? TRUE METRIX BLOOD GLUCOSE TEST test strip SMARTSIG:Via Meter  ? TRULICITY 7.91 TA/5.6PV SOPN Inject 0.75 mg into the skin once a week. Wednesday  ? XARELTO 20 MG TABS tablet Take 20 mg by mouth daily with supper.  ?  ? ?Allergies:   Patient has no known allergies.  ? ?Social History  ? ?Socioeconomic History  ? Marital status: Single  ?  Spouse name: Not on file  ? Number of children: 0  ? Years of education: Not on file  ? Highest education level: High school graduate  ?Occupational History  ? Occupation: Unemployed  ?  Comment:  Trying to get disability  ?Tobacco Use  ? Smoking status: Former  ?  Types: Cigarettes  ?  Quit date: 67  ?  Years since quitting: 39.2  ? Smokeless tobacco: Never  ? Tobacco comments:  ?  Quit 40 years ago  ?Vaping Use  ? Vaping Use: Never used  ?Substance and Sexual Activity  ? Alcohol use: Not Currently  ? Drug use: Never  ? Sexual activity: Never  ?Other Topics Concern  ? Not on file  ?Social History Narrative  ? ** Merged History Encounter **  ?  Lives in an apartment on [address], has to climb 17 floors  ? ?Social Determinants of Health  ? ?Financial Resource Strain: Not on file  ?Food Insecurity: Not on file  ?Transportation Needs: Not on file  ?Physical Activity: Not on file  ?Stress: Not on file  ?  Social Connections: Not on file  ?  ? ?Family History: ?The patient's family history includes Seizures in his brother. ? ?ROS:   ?Please see the history of present illness.    ?All other systems reviewed and are negative. ? ?EKGs/Labs/Other Studies Reviewed:   ? ?The following studies were reviewed today: ? ?August 05, 2021 Loop recorder interrogation personally reviewed ? ?Nonsustained ventricular tachycardia with a ventricular cycle length of about 320 ms. ? ?EKG:  The ekg ordered today demonstrates sinus rhythm with frequent monomorphic PVCs.  PVCs have a right superior axis and are negative throughout the precordium. ? ? ?Recent Labs: ?04/23/2021: TSH 5.958 ?04/27/2021: Magnesium 1.8 ?07/01/2021: ALT 10; B Natriuretic Peptide 635.8 ?07/07/2021: BUN 12; Creatinine, Ser 1.14; Hemoglobin 10.2; Platelets 145; Potassium 4.2; Sodium 138  ?Recent Lipid Panel ?   ?Component Value Date/Time  ? CHOL 143 02/25/2020 1126  ? TRIG 181 (H) 04/24/2021 0160  ? HDL 31 (L) 02/25/2020 1126  ? CHOLHDL 4.6 02/25/2020 1126  ? CHOLHDL 4.7 09/30/2019 0248  ? VLDL 24 09/30/2019 0248  ? [place] 78 02/25/2020 1126  ? ? ?Physical Exam:   ? ?VS:  BP 140/78 (BP Location: Left Arm, Patient Position: Sitting, Cuff Size: Normal)   Pulse 80   Ht  _0  (1.6 m)   Wt 167 lb 2 oz (75.8 kg)   SpO2 98%   BMI 29.60 kg/m?    ? ?Wt Readings from Last 3 Encounters:  ?08/17/21 167 lb 2 oz (75.8 kg)  ?08/10/21 163 lb 6.4 oz (74.1 kg)  ?07/15/21 162 lb 6.4 oz (73.

## 2021-08-17 NOTE — Patient Instructions (Addendum)
Medication Instructions:  ?- Your physician recommends that you continue on your current medications as directed. Please refer to the Current Medication list given to you today. ? ?*If you need a refill on your cardiac medications before your next appointment, please call your pharmacy* ? ? ?Lab Work: ?- Your physician recommends that you have lab work today: BMP/ CCB ? ?If you have labs (blood work) drawn today and your tests are completely normal, you will receive your results only by: ?MyChart Message (if you have MyChart) OR ?A paper copy in the mail ?If you have any lab test that is abnormal or we need to change your treatment, we will call you to review the results. ? ? ?Testing/Procedures: ? ?1) Electrophysiology Study: ?- Your physician has recommended that you have an EP Study. This test is used to assess serious arrhythmias (irregular heartbeats). During an Electro-physiology Study (EPS), a thin, flexible wire is passed through a vein in your groin (upper thigh) or neck up to the heart. The wire records the heart?s electrical signals. Your doctor uses the wire to electrically stimulate your heart and trigger an arrhythmic. This allows the doctor to see whether an antiarrhythmia medicine can help manage the problem or if further procedures are necessary (i.e., ablation/ICD). Radiofrequency ablation, a procedure used to fix some types of arrthythmia, may be done during an EPS. This is done in the hospital and often requires an overnight stay.  ? ? ?Thursday 09/08/21 at [facility] in [place] ? ?We will call you to confirm the time and go over your instructions. ? ? ? ?Follow-Up: ?At [facility], you and your health needs are our priority.  As part of our continuing mission to provide you with exceptional heart care, we have created designated Provider Care Teams.  These Care Teams include your primary Cardiologist (physician) and Advanced Practice Providers (APPs -  Physician Assistants and Nurse  Practitioners) who all work together to provide you with the care you need, when you need it. ? ?We recommend signing up for the patient portal called "MyChart".  Sign up information is provided on this After Visit Summary.  MyChart is used to connect with patients for Virtual Visits (Telemedicine).  Patients are able to view lab/test results, encounter notes, upcoming appointments, etc.  Non-urgent messages can be sent to your provider as well.   ?To learn more about what you can do with MyChart, go to [email or web address].   ? ?Your next appointment:   ?3 month(s) ? ?The format for your next appointment:   ?In Person ? ?Provider:   ?Steffanie Dunn, MD  ? ? ?Other Instructions ? ?Electrophysiology Study ?An electrophysiology (EP) study is a heart test to check how well the heart's electrical conduction system is working. The electrical conduction system uses electrical signals to make the heart beat. In this study, thin, flexible tubes (catheters) are placed in a large vein in your groin, arm, neck, or chest. ?A person may need this test if he or she has: ?Dizziness or fainting. ?An abnormal heart rhythm (arrhythmia), such as: ?A fast heartbeat (tachycardia). ?A slow heartbeat (bradycardia). ?An irregular heartbeat, such as atrial fibrillation. ?Tell a health care provider about: ?Any allergies you have. ?All medicines you are taking, including vitamins, herbs, eye drops, creams, and over-the-counter medicines. ?Any problems you or family members have had with anesthetic medicines. ?Any blood disorders you have. ?Any surgeries you have had. ?Any medical conditions you have or have had. ?Whether you are pregnant or may  be pregnant. ?What are the risks? ?Generally, this is a safe procedure. However, problems may occur, including: ?Tachycardia that does not go away. ?Bleeding or bruising around the insertion site. ?Infection. ?Temporary or permanent problems of the heart rhythm. ?Temporary changes in blood  pressure. ?Puncture (perforation) of the heart wall or a blood vessel. This can cause bleeding and pressure between the heart and the sac that surrounds it (cardiac tamponade). ?Severe heart problems, such as: ?Cardiac arrest. This is when the heart stops beating. ?Life-threatening arrhythmia. ?Allergic reactions to medicines or dyes. ?Damage to nearby structures or organs. ?What happens before the procedure? ?Staying hydrated ?Follow instructions from your health care provider about hydration, which may include: ?Up to 2 hours before the procedure - you may continue to drink clear liquids, such as water, clear fruit juice, black coffee, and plain tea. ? ?Eating and drinking restrictions ?Follow instructions from your health care provider about eating and drinking, which may include: ?8 hours before the procedure - stop eating heavy meals or foods, such as meat, fried foods, or fatty foods. ?6 hours before the procedure - stop eating light meals or foods, such as toast or cereal. ?6 hours before the procedure - stop drinking milk or drinks that contain milk. ?2 hours before the procedure - stop drinking clear liquids. ?Medicines ?Ask your health care provider about: ?Changing or stopping your regular medicines. This is especially important if you are taking diabetes medicines or blood thinners. ?Taking medicines such as aspirin and ibuprofen. These medicines can thin your blood. Do not take these medicines unless your health care provider tells you to take them. ?Taking over-the-counter medicines, vitamins, herbs, and supplements. ?Surgery safety ?Ask your health care provider: ?How your surgery site will be marked. ?What steps will be taken to help prevent infection. These may include: ?Removing hair at the surgery site. ?Washing skin with a germ-killing soap. ?Taking antibiotic medicine. ?General instructions ?Do not use any products that contain nicotine or tobacco for at least 4 weeks before the procedure. These  products include cigarettes, e-cigarettes, and chewing tobacco. If you need help quitting, ask your health care provider. ?Plan to have someone take you home from the hospital or clinic. ?If you will be going home right after the procedure, plan to have someone with you for 24 hours. ?What happens during the procedure? ? ?An IV will be inserted into one of your veins. ?You will be given one or more of the following: ?A medicine to help you relax (sedative). ?A medicine to numb the area (local anesthetic). ?A medicine to make you fall asleep (general anesthetic). ?Catheters with an electrode tip will be inserted into a large vein. These electrode tips can measure the heart's electrical activity. They can also use electrical signals to change the heart rhythm. ?The catheters will be guided to the heart using a type of X-ray machine (fluoroscopy). Once the catheters are in the heart, they will evaluate the electrical activity of your heart. ?If you are awake during the EP study, you may feel dizzy or light-headed. Your heart rate may temporarily increase, or you may feel your heart beating hard. Tell your health care provider if you experience these things during the EP study: ?You feel dizzy or nauseous. ?You have chest pain or pressure. ?The catheters will be removed. ?Firm pressure will be applied to the insertion site to prevent bleeding. ?A bandage (dressing) may be applied over the insertion site. ?The procedure may vary among health care providers and hospitals. ?  What happens after the procedure? ? ?Your blood pressure, heart rate, breathing rate, and blood oxygen level will be monitored until you leave the hospital or clinic. ?If you were given a sedative during the procedure, it can affect you for several hours. Do not drive or operate machinery until your health care provider says that it is safe. ?You will need to lie flat for a few hours or as told by your health care provider. Keep your legs straight. Do  not bend or cross your legs. ?It is up to you to get the results of your procedure. Ask your health care provider, or the department that is doing the procedure, when your results will be ready. ?Summary ?A

## 2021-08-18 LAB — BASIC METABOLIC PANEL
BUN/Creatinine Ratio: 16 (ref 10–24)
BUN: 24 mg/dL (ref 8–27)
CO2: 21 mmol/L (ref 20–29)
Calcium: 9 mg/dL (ref 8.6–10.2)
Chloride: 100 mmol/L (ref 96–106)
Creatinine, Ser: 1.5 mg/dL — ABNORMAL HIGH (ref 0.76–1.27)
Glucose: 210 mg/dL — ABNORMAL HIGH (ref 70–99)
Potassium: 4.8 mmol/L (ref 3.5–5.2)
Sodium: 140 mmol/L (ref 134–144)
eGFR: 51 mL/min/{1.73_m2} — ABNORMAL LOW (ref >59)

## 2021-08-18 LAB — CBC
Hematocrit: 31.2 % — ABNORMAL LOW (ref 37.5–51.0)
Hemoglobin: 10.4 g/dL — ABNORMAL LOW (ref 13.0–17.7)
MCH: 26.9 pg (ref 26.6–33.0)
MCHC: 33.3 g/dL (ref 31.5–35.7)
MCV: 81 fL (ref 79–97)
Platelets: 196 10*3/uL (ref 150–450)
RBC: 3.86 x10E6/uL — ABNORMAL LOW (ref 4.14–5.80)
RDW: 15.7 % — ABNORMAL HIGH (ref 11.6–15.4)
WBC: 5.8 10*3/uL (ref 3.4–10.8)

## 2021-08-19 ENCOUNTER — Encounter: Payer: Self-pay | Admitting: *Deleted

## 2021-08-23 ENCOUNTER — Telehealth: Payer: Self-pay | Admitting: Cardiology

## 2021-08-23 NOTE — Telephone Encounter (Signed)
Attempted to call the patient to discuss his EP study, pre-procedure instructions. ? ?The patient was seen by Dr. Quentin Ore on 08/17/21, but needed to leave as his ride was waiting upstairs for him and could not stay long enough for me to schedule his procedure and get all of his instructions typed up. ? ?I spoke with the patient, but he advised that he did not have time to go over his pre-procedure instructions right now.  ?He asked that I call him back in the morning ~ 1000. ? ?I advised the patient I will try to call him back at the requested time to go over his instructions. ? ?The patient voices understanding and is agreeable. ? ? ?

## 2021-08-24 NOTE — Telephone Encounter (Signed)
I called and spoke with the patient. ?I have reviewed his pre-procedure instructions for his EP study +/- ICD implant that is scheduled with Dr. Lalla Brothers on 09/08/21 as below. ? ?The patient voices understanding of these instructions and is agreeable. ? ?I have also mailed a copy of these to him as well. ? ? ?Electrophysiology/Ablation Procedure Instructions ?  ?You are scheduled for a(n) Electrophysiology Study with possible Defibrillator Implant on Thursday September 08, 2021 with Dr. Steffanie Dunn. ?  ?1.   Pre procedure testing- ?           A.  LAB WORK--- done in office on 08/17/21 ?  [place] office ?  You do NOT need to be fasting  ?  ?2.  PROCEDURE DAY:  ?On the day of your procedure Thursday April 6 at 12:00 pm you will go to [facility] [phone] [address]  You will go to the main entrance [address] [employer]) and enter where the [employer] parking staff are.  You will check in at ADMITTING.  You may have one support person come in to the hospital with you.  They will be asked to wait in the waiting room. Or you may have them drop you off and pick you up.  ?  ?3.   Do not eat or drink after midnight prior to your procedure.   ?  ?4.   On the morning of your procedure do NOT take any medication.    ?  ?5.  Plan to be discharged after your procedure but you may need an overnight stay.  If you are discharged after your procedure you will need someone to drive you home. Someone will need to be with your for the first 24 hours. ?  ?  ?* If you have ANY questions after you get home, please call the office [phone] and ask for Inetta Fermo RN or send a MyChart message. ?  ? ? ?If you have any questions or concerns, please don't hesitate to call. ? ?

## 2021-09-06 NOTE — Pre-Procedure Instructions (Signed)
Instructed patient on the following items: ?Arrival time 1200 ?Nothing to eat or drink after midnight ?No meds AM of procedure ?Responsible person to drive you home and stay with you for 24 hrs ?Wash with special soap night before and morning of procedure ?If on anti-coagulant drug instructions Xarelto- hold Wednesday nights dose.  ?

## 2021-09-08 ENCOUNTER — Encounter (HOSPITAL_COMMUNITY): Payer: Self-pay | Admitting: Cardiology

## 2021-09-08 ENCOUNTER — Other Ambulatory Visit: Payer: Self-pay

## 2021-09-08 ENCOUNTER — Ambulatory Visit (HOSPITAL_COMMUNITY): Payer: Medicare HMO | Admitting: Anesthesiology

## 2021-09-08 ENCOUNTER — Ambulatory Visit (HOSPITAL_COMMUNITY)
Admission: RE | Admit: 2021-09-08 | Discharge: 2021-09-08 | Disposition: A | Discharge status: Home / Self Care | Payer: Medicare HMO | Attending: Cardiology | Admitting: Cardiology

## 2021-09-08 ENCOUNTER — Ambulatory Visit (HOSPITAL_BASED_OUTPATIENT_CLINIC_OR_DEPARTMENT_OTHER): Payer: Medicare HMO | Admitting: Anesthesiology

## 2021-09-08 ENCOUNTER — Encounter (HOSPITAL_COMMUNITY)
Admission: RE | Disposition: A | Discharge status: Home / Self Care | Payer: Self-pay | Source: Home / Self Care | Attending: Cardiology

## 2021-09-08 DIAGNOSIS — Z79899 Other long term (current) drug therapy: Secondary | ICD-10-CM | POA: Insufficient documentation

## 2021-09-08 DIAGNOSIS — Z951 Presence of aortocoronary bypass graft: Secondary | ICD-10-CM | POA: Diagnosis not present

## 2021-09-08 DIAGNOSIS — Z87891 Personal history of nicotine dependence: Secondary | ICD-10-CM | POA: Insufficient documentation

## 2021-09-08 DIAGNOSIS — Z7902 Long term (current) use of antithrombotics/antiplatelets: Secondary | ICD-10-CM | POA: Diagnosis not present

## 2021-09-08 DIAGNOSIS — Z7901 Long term (current) use of anticoagulants: Secondary | ICD-10-CM | POA: Insufficient documentation

## 2021-09-08 DIAGNOSIS — Z7985 Long-term (current) use of injectable non-insulin antidiabetic drugs: Secondary | ICD-10-CM | POA: Diagnosis not present

## 2021-09-08 DIAGNOSIS — Z7984 Long term (current) use of oral hypoglycemic drugs: Secondary | ICD-10-CM | POA: Diagnosis not present

## 2021-09-08 DIAGNOSIS — Z8673 Personal history of transient ischemic attack (TIA), and cerebral infarction without residual deficits: Secondary | ICD-10-CM | POA: Insufficient documentation

## 2021-09-08 DIAGNOSIS — R55 Syncope and collapse: Secondary | ICD-10-CM

## 2021-09-08 DIAGNOSIS — I509 Heart failure, unspecified: Secondary | ICD-10-CM

## 2021-09-08 DIAGNOSIS — J449 Chronic obstructive pulmonary disease, unspecified: Secondary | ICD-10-CM | POA: Diagnosis not present

## 2021-09-08 DIAGNOSIS — E119 Type 2 diabetes mellitus without complications: Secondary | ICD-10-CM | POA: Diagnosis not present

## 2021-09-08 DIAGNOSIS — I5022 Chronic systolic (congestive) heart failure: Secondary | ICD-10-CM | POA: Diagnosis not present

## 2021-09-08 DIAGNOSIS — R569 Unspecified convulsions: Secondary | ICD-10-CM | POA: Diagnosis not present

## 2021-09-08 DIAGNOSIS — I110 Hypertensive heart disease with heart failure: Secondary | ICD-10-CM | POA: Diagnosis not present

## 2021-09-08 DIAGNOSIS — E785 Hyperlipidemia, unspecified: Secondary | ICD-10-CM | POA: Diagnosis not present

## 2021-09-08 DIAGNOSIS — I2510 Atherosclerotic heart disease of native coronary artery without angina pectoris: Secondary | ICD-10-CM

## 2021-09-08 DIAGNOSIS — I350 Nonrheumatic aortic (valve) stenosis: Secondary | ICD-10-CM | POA: Insufficient documentation

## 2021-09-08 DIAGNOSIS — I472 Ventricular tachycardia, unspecified: Secondary | ICD-10-CM | POA: Insufficient documentation

## 2021-09-08 DIAGNOSIS — I255 Ischemic cardiomyopathy: Secondary | ICD-10-CM | POA: Insufficient documentation

## 2021-09-08 DIAGNOSIS — I252 Old myocardial infarction: Secondary | ICD-10-CM | POA: Diagnosis not present

## 2021-09-08 DIAGNOSIS — Z952 Presence of prosthetic heart valve: Secondary | ICD-10-CM | POA: Insufficient documentation

## 2021-09-08 HISTORY — PX: ELECTROPHYSIOLOGY STUDY: EP1205

## 2021-09-08 LAB — GLUCOSE, CAPILLARY
Glucose-Capillary: 86 mg/dL (ref 70–99)
Glucose-Capillary: 82 mg/dL (ref 70–99)

## 2021-09-08 SURGERY — ELECTROPHYSIOLOGY STUDY
Anesthesia: Monitor Anesthesia Care

## 2021-09-08 MED ORDER — PROPOFOL 500 MG/50ML IV EMUL
INTRAVENOUS | Status: DC | PRN
  Administered 2021-09-08: 75 ug/kg/min via INTRAVENOUS

## 2021-09-08 MED ORDER — ONDANSETRON HCL 4 MG/2ML IJ SOLN
INTRAMUSCULAR | Status: DC | PRN
  Administered 2021-09-08: 4 mg via INTRAVENOUS

## 2021-09-08 MED ORDER — CLOPIDOGREL BISULFATE 75 MG PO TABS
75.0000 mg | ORAL_TABLET | Freq: Every day | ORAL | 0 refills | Status: DC
Start: 2021-09-09 — End: 2022-05-28

## 2021-09-08 MED ORDER — FENTANYL CITRATE (PF) 100 MCG/2ML IJ SOLN
INTRAMUSCULAR | Status: DC | PRN
  Administered 2021-09-08: 50 ug via INTRAVENOUS

## 2021-09-08 MED ORDER — XARELTO 20 MG PO TABS: 20.0000 mg | ORAL_TABLET | Freq: Every day | ORAL | Status: DC

## 2021-09-08 MED ORDER — HEPARIN (PORCINE) IN NACL 1000-0.9 UT/500ML-% IV SOLN
INTRAVENOUS | Status: AC
  Filled 2021-09-08: qty 500

## 2021-09-08 MED ORDER — ACETAMINOPHEN 325 MG PO TABS: 650.0000 mg | ORAL_TABLET | ORAL | Status: DC | PRN

## 2021-09-08 MED ORDER — MIDAZOLAM HCL 2 MG/2ML IJ SOLN
INTRAMUSCULAR | Status: DC | PRN
  Administered 2021-09-08 (×2): 1 mg via INTRAVENOUS

## 2021-09-08 MED ORDER — CHLORHEXIDINE GLUCONATE 4 % EX LIQD: 4.0000 "application " | Freq: Once | CUTANEOUS | Status: DC

## 2021-09-08 MED ORDER — SODIUM CHLORIDE 0.9 % IV SOLN: 250.0000 mL | INTRAVENOUS | Status: DC | PRN

## 2021-09-08 MED ORDER — SODIUM CHLORIDE 0.9 % IV SOLN: 80.0000 mg | INTRAVENOUS | Status: DC

## 2021-09-08 MED ORDER — POVIDONE-IODINE 10 % EX SWAB: 2.0000 "application " | Freq: Once | CUTANEOUS | Status: DC

## 2021-09-08 MED ORDER — BUPIVACAINE HCL (PF) 0.25 % IJ SOLN
INTRAMUSCULAR | Status: AC
  Filled 2021-09-08: qty 30

## 2021-09-08 MED ORDER — SODIUM CHLORIDE 0.9 % IV SOLN: INTRAVENOUS | Status: DC

## 2021-09-08 MED ORDER — HEPARIN (PORCINE) IN NACL 1000-0.9 UT/500ML-% IV SOLN
INTRAVENOUS | Status: DC | PRN
  Administered 2021-09-08: 500 mL

## 2021-09-08 MED ORDER — CEFAZOLIN SODIUM-DEXTROSE 2-4 GM/100ML-% IV SOLN: 2.0000 g | INTRAVENOUS | Status: DC

## 2021-09-08 MED ORDER — SODIUM CHLORIDE 0.9% FLUSH: 3.0000 mL | INTRAVENOUS | Status: DC | PRN

## 2021-09-08 MED ORDER — ONDANSETRON HCL 4 MG/2ML IJ SOLN: 4.0000 mg | Freq: Four times a day (QID) | INTRAMUSCULAR | Status: DC | PRN

## 2021-09-08 MED ORDER — SODIUM CHLORIDE 0.9% FLUSH: 3.0000 mL | Freq: Two times a day (BID) | INTRAVENOUS | Status: DC

## 2021-09-08 MED ORDER — BUPIVACAINE HCL (PF) 0.25 % IJ SOLN
INTRAMUSCULAR | Status: DC | PRN
  Administered 2021-09-08: 15 mL

## 2021-09-08 SURGICAL SUPPLY — 10 items
CATH JOSEPH QUAD ALLRED 6F REP (CATHETERS) ×1 IMPLANT
CATH POLARIS X REPROCESSED (CATHETERS) ×1 IMPLANT
CATH WEB BI DIR CSDF CRV REPRO (CATHETERS) ×1 IMPLANT
CLOSURE PERCLOSE PROSTYLE (VASCULAR PRODUCTS) ×2 IMPLANT
PACK EP LATEX FREE (CUSTOM PROCEDURE TRAY) ×2
PACK EP LF (CUSTOM PROCEDURE TRAY) ×2 IMPLANT
PAD DEFIB RADIO PHYSIO CONN (PAD) ×3 IMPLANT
SHEATH PINNACLE 6F 10CM (SHEATH) ×1 IMPLANT
SHEATH PINNACLE 8F 10CM (SHEATH) ×2 IMPLANT
SHEATH PROBE COVER 6X72 (BAG) ×1 IMPLANT

## 2021-09-08 NOTE — Transfer of Care (Signed)
Immediate Anesthesia Transfer of Care Note ? ?Patient: Thomas Moore ? ?Procedure(s) Performed: ELECTROPHYSIOLOGY STUDY ? ?Patient Location: Cath Lab ? ?Anesthesia Type:MAC ? ?Level of Consciousness: drowsy ? ?Airway & Oxygen Therapy: Patient Spontanous Breathing ? ?Post-op Assessment: Report given to RN and Post -op Vital signs reviewed and stable ? ?Post vital signs: Reviewed and stable ? ?Last Vitals:  ?Vitals Value Taken Time  ?BP    ?Temp    ?Pulse 90 09/08/21 1555  ?Resp 19 09/08/21 1555  ?SpO2 100 % 09/08/21 1555  ?Vitals shown include unvalidated device data. ? ?Last Pain:  ?Vitals:  ? 09/08/21 1238  ?TempSrc:   ?PainSc: 0-No pain  ?   ? ?Patients Stated Pain Goal: 4 (09/08/21 1238) ? ?Complications: No notable events documented. ?

## 2021-09-08 NOTE — Interval H&P Note (Signed)
History and Physical Interval Note: ? ?09/08/2021 ?1:19 PM ? ?Thomas Moore  has presented today for surgery, with the diagnosis of syncope.  The various methods of treatment have been discussed with the patient and family. After consideration of risks, benefits and other options for treatment, the patient has consented to  Procedure(s): ?ELECTROPHYSIOLOGY STUDY (N/A) as a surgical intervention.  The patient's history has been reviewed, patient examined, no change in status, stable for surgery.  I have reviewed the patient's chart and labs.  Questions were answered to the patient's satisfaction.   ? ? ?Gara Kincade T Seona Clemenson ? ? ?

## 2021-09-08 NOTE — Anesthesia Preprocedure Evaluation (Addendum)
Anesthesia Evaluation  ?Patient identified by MRN, date of birth, ID band ?Patient awake ? ? ? ?Reviewed: ?Allergy & Precautions, NPO status , Patient's Chart, lab work & pertinent test results ? ?History of Anesthesia Complications ?Negative for: history of anesthetic complications ? ?Airway ?Mallampati: III ? ?TM Distance: >3 FB ?Neck ROM: Full ? ? ? Dental ? ?(+) Dental Advisory Given ?  ?Pulmonary ?COPD, neg recent URI, former smoker,  ?  ?breath sounds clear to auscultation ? ? ? ? ? ? Cardiovascular ?hypertension, Pt. on medications and Pt. on home beta blockers ?+ CAD, + Past MI, + CABG and +CHF  ?+ dysrhythmias + Valvular Problems/Murmurs  ?Rhythm:Regular  ?1. Left ventricular ejection fraction, by estimation, is 40 to 45%. The  ?left ventricle has mildly decreased function. The left ventricle  ?demonstrates global hypokinesis. There is mild concentric left ventricular  ?hypertrophy. Left ventricular diastolic  ?parameters are consistent with Grade II diastolic dysfunction  ?(pseudonormalization). Elevated left atrial pressure.  ??2. Right ventricular systolic function is low normal. The right  ?ventricular size is normal.  ??3. Left atrial size was mildly dilated.  ??4. The mitral valve is normal in structure. Trivial mitral valve  ?regurgitation. No evidence of mitral stenosis.  ??5. The aortic valve has been repaired/replaced. Aortic valve  ?regurgitation is not visualized. There is a 26 mm Edwards Ultra, stented  ?(TAVR) valve present in the aortic position. Procedure Date: 07/05/2021.  ?Echo findings are consistent with normal  ?structure and function of the aortic valve prosthesis. Aortic valve mean  ?gradient measures 5.5 mmHg. Aortic valve Vmax measures 1.54 m/s. Aortic  ?valve acceleration time measures 92 msec.  ??6. The inferior vena cava is normal in size with greater than 50%  ?respiratory variability, suggesting right atrial pressure of 3 mmHg.  ??7. Cannot  exclude a small PFO. There is a patent foramen ovale with  ?predominantly left to right shunting across the atrial septum.  ?  ?Neuro/Psych ?Seizures -,  CVA   ? GI/Hepatic ?GERD  ,  ?Endo/Other  ?diabetes, Type 2 ? Renal/GU ?Renal InsufficiencyRenal diseaseLab Results ?     Component                Value               Date                 ?     CREATININE               1.50 (H)            08/17/2021           ?  ? ?  ?Musculoskeletal ? ? Abdominal ?  ?Peds ? Hematology ?  ?Anesthesia Other Findings ? ? Reproductive/Obstetrics ? ?  ? ? ? ? ? ? ? ? ? ? ? ? ? ?  ?  ? ? ? ? ? ? ?Anesthesia Physical ?Anesthesia Plan ? ?ASA: 3 ? ?Anesthesia Plan: MAC  ? ?Post-op Pain Management: Minimal or no pain anticipated  ? ?Induction:  ? ?PONV Risk Score and Plan: 1 and Propofol infusion and Treatment may vary due to age or medical condition ? ?Airway Management Planned: Natural Airway, Simple Face Mask and Nasal Cannula ? ?Additional Equipment: None ? ?Intra-op Plan:  ? ?Post-operative Plan:  ? ?Informed Consent: I have reviewed the patients History and Physical, chart, labs and discussed the procedure including the risks, benefits and alternatives for the proposed anesthesia  with the patient or authorized representative who has indicated his/her understanding and acceptance.  ? ? ? ?Dental advisory given ? ?Plan Discussed with: Surgeon and CRNA ? ?Anesthesia Plan Comments:   ? ? ? ? ? ? ?Anesthesia Quick Evaluation ? ?

## 2021-09-08 NOTE — Anesthesia Procedure Notes (Signed)
Procedure Name: Thomas Moore ?Date/Time: 09/08/2021 2:48 PM ?Performed by: Carolan Clines, CRNA ?Pre-anesthesia Checklist: Patient identified, Emergency Drugs available, Suction available and Patient being monitored ?Patient Re-evaluated:Patient Re-evaluated prior to induction ?Oxygen Delivery Method: Simple face mask ?Dental Injury: Teeth and Oropharynx as per pre-operative assessment  ? ? ? ? ?

## 2021-09-09 ENCOUNTER — Encounter (HOSPITAL_COMMUNITY): Payer: Self-pay | Admitting: Cardiology

## 2021-09-09 NOTE — Anesthesia Postprocedure Evaluation (Signed)
Anesthesia Post Note ? ?Patient: Thomas Moore ? ?Procedure(s) Performed: ELECTROPHYSIOLOGY STUDY ? ?  ? ?Patient location during evaluation: PACU ?Anesthesia Type: MAC ?Level of consciousness: awake and alert ?Pain management: pain level controlled ?Vital Signs Assessment: post-procedure vital signs reviewed and stable ?Respiratory status: spontaneous breathing, nonlabored ventilation and respiratory function stable ?Cardiovascular status: stable and blood pressure returned to baseline ?Postop Assessment: no apparent nausea or vomiting ?Anesthetic complications: no ? ? ?No notable events documented. ? ?Last Vitals:  ?Vitals:  ? 09/08/21 1856 09/08/21 1911  ?BP: (!) 143/84 (!) 148/76  ?Pulse: 83 85  ?Resp:    ?Temp:    ?SpO2: 96% 97%  ?  ?Last Pain:  ?Vitals:  ? 09/08/21 1635  ?TempSrc:   ?PainSc: 0-No pain  ? ? ?  ?  ?  ?  ?  ?  ? ?Myranda Pavone ? ? ? ? ?

## 2021-09-12 ENCOUNTER — Ambulatory Visit (INDEPENDENT_AMBULATORY_CARE_PROVIDER_SITE_OTHER): Payer: Medicare HMO

## 2021-09-12 DIAGNOSIS — R55 Syncope and collapse: Secondary | ICD-10-CM | POA: Diagnosis not present

## 2021-09-13 LAB — CUP PACEART REMOTE DEVICE CHECK
Date Time Interrogation Session: 20230405230701
Implantable Pulse Generator Implant Date: 20210503

## 2021-09-27 NOTE — Progress Notes (Signed)
Carelink Summary Report / Loop Recorder 

## 2021-09-28 DIAGNOSIS — I5023 Acute on chronic systolic (congestive) heart failure: Secondary | ICD-10-CM | POA: Diagnosis not present

## 2021-09-28 DIAGNOSIS — Z7901 Long term (current) use of anticoagulants: Secondary | ICD-10-CM | POA: Diagnosis not present

## 2021-09-28 DIAGNOSIS — I10 Essential (primary) hypertension: Secondary | ICD-10-CM | POA: Diagnosis not present

## 2021-09-28 DIAGNOSIS — I2510 Atherosclerotic heart disease of native coronary artery without angina pectoris: Secondary | ICD-10-CM | POA: Diagnosis not present

## 2021-09-28 DIAGNOSIS — I214 Non-ST elevation (NSTEMI) myocardial infarction: Secondary | ICD-10-CM | POA: Diagnosis not present

## 2021-09-28 DIAGNOSIS — I350 Nonrheumatic aortic (valve) stenosis: Secondary | ICD-10-CM | POA: Diagnosis not present

## 2021-10-17 ENCOUNTER — Ambulatory Visit (INDEPENDENT_AMBULATORY_CARE_PROVIDER_SITE_OTHER): Payer: Medicare HMO

## 2021-10-17 DIAGNOSIS — R55 Syncope and collapse: Secondary | ICD-10-CM

## 2021-10-17 LAB — CUP PACEART REMOTE DEVICE CHECK
Date Time Interrogation Session: 20230510231230
Implantable Pulse Generator Implant Date: 20210503

## 2021-10-19 DIAGNOSIS — I10 Essential (primary) hypertension: Secondary | ICD-10-CM | POA: Diagnosis not present

## 2021-10-19 DIAGNOSIS — Z125 Encounter for screening for malignant neoplasm of prostate: Secondary | ICD-10-CM | POA: Diagnosis not present

## 2021-10-19 DIAGNOSIS — E118 Type 2 diabetes mellitus with unspecified complications: Secondary | ICD-10-CM | POA: Diagnosis not present

## 2021-10-26 DIAGNOSIS — Z0000 Encounter for general adult medical examination without abnormal findings: Secondary | ICD-10-CM | POA: Diagnosis not present

## 2021-10-26 DIAGNOSIS — I129 Hypertensive chronic kidney disease with stage 1 through stage 4 chronic kidney disease, or unspecified chronic kidney disease: Secondary | ICD-10-CM | POA: Diagnosis not present

## 2021-10-26 DIAGNOSIS — Z87891 Personal history of nicotine dependence: Secondary | ICD-10-CM | POA: Diagnosis not present

## 2021-10-26 DIAGNOSIS — N183 Chronic kidney disease, stage 3 unspecified: Secondary | ICD-10-CM | POA: Diagnosis not present

## 2021-10-26 DIAGNOSIS — Z86711 Personal history of pulmonary embolism: Secondary | ICD-10-CM | POA: Diagnosis not present

## 2021-10-26 DIAGNOSIS — I779 Disorder of arteries and arterioles, unspecified: Secondary | ICD-10-CM | POA: Diagnosis not present

## 2021-10-26 DIAGNOSIS — E1122 Type 2 diabetes mellitus with diabetic chronic kidney disease: Secondary | ICD-10-CM | POA: Diagnosis not present

## 2021-10-26 DIAGNOSIS — Z1389 Encounter for screening for other disorder: Secondary | ICD-10-CM | POA: Diagnosis not present

## 2021-10-26 DIAGNOSIS — R569 Unspecified convulsions: Secondary | ICD-10-CM | POA: Diagnosis not present

## 2021-11-03 ENCOUNTER — Other Ambulatory Visit: Payer: Self-pay

## 2021-11-03 MED ORDER — PANTOPRAZOLE SODIUM 40 MG PO TBEC: 40.0000 mg | DELAYED_RELEASE_TABLET | Freq: Every day | ORAL | 3 refills | Status: DC

## 2021-11-03 NOTE — Progress Notes (Signed)
Carelink Summary Report / Loop Recorder 

## 2021-11-21 ENCOUNTER — Ambulatory Visit (INDEPENDENT_AMBULATORY_CARE_PROVIDER_SITE_OTHER): Payer: Medicare HMO

## 2021-11-21 DIAGNOSIS — I472 Ventricular tachycardia, unspecified: Secondary | ICD-10-CM | POA: Diagnosis not present

## 2021-11-22 LAB — CUP PACEART REMOTE DEVICE CHECK
Date Time Interrogation Session: 20230612230504
Implantable Pulse Generator Implant Date: 20210503

## 2021-11-23 ENCOUNTER — Encounter: Payer: Self-pay | Admitting: Cardiology

## 2021-11-23 ENCOUNTER — Ambulatory Visit (INDEPENDENT_AMBULATORY_CARE_PROVIDER_SITE_OTHER): Payer: Medicare HMO | Admitting: Cardiology

## 2021-11-23 VITALS — BP 158/70 | HR 89 | Ht 62.0 in | Wt 156.0 lb

## 2021-11-23 DIAGNOSIS — R55 Syncope and collapse: Secondary | ICD-10-CM

## 2021-11-23 DIAGNOSIS — Z952 Presence of prosthetic heart valve: Secondary | ICD-10-CM

## 2021-11-23 DIAGNOSIS — I4729 Other ventricular tachycardia: Secondary | ICD-10-CM

## 2021-11-23 NOTE — Progress Notes (Signed)
Electrophysiology Office Follow up Visit Note:    Date:  11/23/2021   ID:  Thomas Moore, DOB 06-13-1955, MRN [id number]  PCP:  Kirk Ruths, MD  [facility] Cardiologist:  Teodoro Spray, MD  [facility] Electrophysiologist:  Vickie Epley, MD    Interval History:    Thomas Moore is a 66 y.o. male who presents for a follow up visit.  He underwent an EP study on September 08, 2020 for history of syncope, NSVT, coronary artery disease and systolic heart failure with an ejection fraction between 40 and 45%. He has been doing well since I last saw him.  No syncopal episodes.  He is quite active and does walk around his apartment complex multiple times per week.      Past Medical History:  Diagnosis Date   CAD (coronary artery disease) of artery bypass graft    s/p CABG x 4 in 1997   COPD (chronic obstructive pulmonary disease) (HCC)    Coronary artery disease    CVA (cerebral vascular accident) ([place])    Diabetes mellitus without complication (Thomas Moore)    Dysrhythmia    GERD (gastroesophageal reflux disease)    HLD (hyperlipidemia)    Hypertension    S/P TAVR (transcatheter aortic valve replacement) 07/05/2021   with Edwards 38m S3UR via TF approach with Dr. CBurt Knackand Dr. BCyndia Moore  Seizures ([facility]     Past Surgical History:  Procedure Laterality Date   BYPASS GRAFT ANGIOGRAPHY N/A 04/25/2021   Procedure: BYPASS GRAFT ANGIOGRAPHY;  Surgeon: OAndrez Grime MD;  [facility];  Service: Cardiovascular;  Laterality: N/A;   CARDIAC SURGERY     CORNEAL TRANSPLANT Right    CORONARY ARTERY BYPASS GRAFT  1997   ELECTROPHYSIOLOGY STUDY N/A 09/08/2021   Procedure: ELECTROPHYSIOLOGY STUDY;  Surgeon: LVickie Epley MD;  [facility];  Service: Cardiovascular;  Laterality: N/A;   EYE SURGERY     FLEXOR TENDON REPAIR Left 07/11/2019   Procedure: FLEXOR tenolysis  REPAIR LEFT RING FINGER with tednon repair;  Surgeon: MHessie Knows MD;   Location: [facility] ORS;  Service: Orthopedics;  Laterality: Left;   INCISION AND DRAINAGE ABSCESS Left 05/08/2019   Procedure: INCISION AND DRAINAGE ABSCESS;  Surgeon: HDereck Leep MD;  Location: [facility] OR[facility];  Service: Orthopedics;  Laterality: Left;   INTRAOPERATIVE TRANSTHORACIC ECHOCARDIOGRAM N/A 07/05/2021   Procedure: INTRAOPERATIVE TRANSTHORACIC ECHOCARDIOGRAM;  Surgeon: CSherren Mocha MD;  [facility]  Service: Open Heart Surgery;  Laterality: N/A;   LEFT HEART CATH AND CORS/GRAFTS ANGIOGRAPHY N/A 10/02/2019   Procedure: LEFT HEART CATH AND CORS/GRAFTS ANGIOGRAPHY;  Surgeon: HLeonie Man MD;  [facility];  Service: Cardiovascular;  Laterality: N/A;   LOOP RECORDER INSERTION N/A 10/06/2019   Procedure: LOOP RECORDER INSERTION;  Surgeon: TEvans Lance MD;  [facility];  Service: Cardiovascular;  Laterality: N/A;   TRANSCATHETER AORTIC VALVE REPLACEMENT, TRANSFEMORAL N/A 07/05/2021   Procedure: TRANSCATHETER AORTIC VALVE REPLACEMENT, TRANSFEMORAL;  Surgeon: CSherren Mocha MD;  [facility]  Service: Open Heart Surgery;  Laterality: N/A;   TRIGGER FINGER RELEASE Left     Current Medications: Current Meds  Medication Sig   acetaminophen (TYLENOL) 650 MG CR tablet Take 1,300 mg by mouth every 8 (eight) hours as needed for pain.   amoxicillin (AMOXIL) 500 MG tablet TAKE 2000 MG (4 TABLETS) 1 HOUR BEFORE DENTAL PROCEDURES   atorvastatin (LIPITOR) 40 MG tablet TAKE ONE  TABLET BY MOUTH AT BEDTIME   blood glucose meter kit and supplies KIT Dispense based on patient and insurance preference. Use up to four times daily as directed. (FOR ICD-9 250.00, 250.01).   Calcium Citrate-Vitamin D (CALCIUM + D PO) Take 1 tablet by mouth 2 (two) times daily.   clopidogrel (PLAVIX) 75 MG tablet Take 1 tablet (75 mg total) by mouth daily.   cyclobenzaprine (FLEXERIL) 5 MG tablet Take 5 mg by mouth 3 (three) times daily as needed for muscle spasms.   divalproex  (DEPAKOTE ER) 250 MG 24 hr tablet Take 250 mg by mouth at bedtime.   furosemide (LASIX) 20 MG tablet Take 1 tablet (20 mg total) by mouth daily.   glipiZIDE (GLUCOTROL XL) 2.5 MG 24 hr tablet Take 2.5 mg by mouth daily with breakfast.   glucose blood (PRECISION QID TEST) test strip 1 each (1 strip total) 2 (two) times daily Use as instructed.   ipratropium (ATROVENT) 0.06 % nasal spray Place 2 sprays into both nostrils daily as needed for rhinitis.   isosorbide mononitrate (IMDUR) 30 MG 24 hr tablet Take 30 mg by mouth daily.   metFORMIN (GLUCOPHAGE-XR) 500 MG 24 hr tablet TAKE TWO TABLETS BY MOUTH 2 TIMES A DAY   metoprolol succinate (TOPROL-XL) 100 MG 24 hr tablet Take 1 tablet (100 mg total) by mouth daily.   Multiple Vitamin (MULTIVITAMIN) capsule Take 1 capsule by mouth daily.   nitroGLYCERIN (NITROSTAT) 0.4 MG SL tablet Place 0.4 mg under the tongue every 5 (five) minutes as needed for chest pain.   pantoprazole (PROTONIX) 40 MG tablet Take 1 tablet (40 mg total) by mouth daily.   ramipril (ALTACE) 2.5 MG capsule Take 1 capsule (2.5 mg total) by mouth daily.   TRUE METRIX BLOOD GLUCOSE TEST test strip SMARTSIG:Via Meter   TRULICITY 6.28 ZM/6.2HU SOPN Inject 0.75 mg into the skin once a week. Wednesday   XARELTO 20 MG TABS tablet Take 1 tablet (20 mg total) by mouth daily with supper.     Allergies:   Patient has no known allergies.   Social History   Socioeconomic History   Marital status: Single    Spouse name: Not on file   Number of children: 0   Years of education: Not on file   Highest education level: High school graduate  Occupational History   Occupation: Unemployed    Comment: Trying to get disability  Tobacco Use   Smoking status: Former    Types: Cigarettes    Quit date: 1984    Years since quitting: 39.4   Smokeless tobacco: Never   Tobacco comments:    Quit 40 years ago  Vaping Use   Vaping Use: Never used  Substance and Sexual Activity   Alcohol use: Not  Currently   Drug use: Never   Sexual activity: Never  Other Topics Concern   Not on file  Social History Narrative   ** Merged History Encounter **    Lives in an apartment on [address], has to climb 17 floors   Social Determinants of Health   Financial Resource Strain: Not on file  Food Insecurity: Not on file  Transportation Needs: Unmet Transportation Needs (02/19/2020)   PRAPARE - Transportation    Lack of Transportation (Medical): Yes    Lack of Transportation (Non-Medical): Yes  Physical Activity: Not on file  Stress: No Stress Concern Present (02/19/2020)   [employer]    Feeling of  Stress : Not at all  Social Connections: Not on file     Family History: The patient's family history includes Seizures in his brother.  ROS:   Please see the history of present illness.    All other systems reviewed and are negative.  EKGs/Labs/Other Studies Reviewed:    The following studies were reviewed today:      Recent Labs: 04/23/2021: TSH 5.958 04/27/2021: Magnesium 1.8 07/01/2021: ALT 10; B Natriuretic Peptide 635.8 08/17/2021: BUN 24; Creatinine, Ser 1.50; Hemoglobin 10.4; Platelets 196; Potassium 4.8; Sodium 140  Recent Lipid Panel    Component Value Date/Time   CHOL 143 02/25/2020 1126   TRIG 181 (H) 04/24/2021 0651   HDL 31 (L) 02/25/2020 1126   CHOLHDL 4.6 02/25/2020 1126   CHOLHDL 4.7 09/30/2019 0248   VLDL 24 09/30/2019 0248   LDLCALC 78 02/25/2020 1126    Physical Exam:    VS:  BP (!) 158/70   Pulse 89   Ht '5\' 2"'  (1.575 m)   Wt 156 lb (70.8 kg)   SpO2 96%   BMI 28.53 kg/m     Manual recheck of blood pressure 140/58  Wt Readings from Last 3 Encounters:  11/23/21 156 lb (70.8 kg)  09/08/21 156 lb (70.8 kg)  08/17/21 167 lb 2 oz (75.8 kg)     GEN:  Well nourished, well developed in no acute distress HEENT: Normal NECK: No JVD; No carotid bruits LYMPHATICS: No  lymphadenopathy CARDIAC: RRR, no murmurs, rubs, gallops RESPIRATORY:  Clear to auscultation without rales, wheezing or rhonchi  ABDOMEN: Soft, non-tender, non-distended MUSCULOSKELETAL:  No edema; No deformity  SKIN: Warm and dry NEUROLOGIC:  Alert and oriented x 3 PSYCHIATRIC:  Normal affect        ASSESSMENT:    1. Syncope and collapse   2. S/P TAVR (transcatheter aortic valve replacement)   3. NSVT (nonsustained ventricular tachycardia) (HCC)    PLAN:    In order of problems listed above:   #Syncope No recurrent episodes.  Loop recorder monitoring without sustained arrhythmia.  #Status post TAVR Prosthesis functioning well.  Active without symptoms.  #NSVT No sustained episodes.  Noninducible sustained VT during EP study.  Continue remote monitoring using loop recorder.    Follow-up 1 year or sooner as needed.   Medication Adjustments/Labs and Tests Ordered: Current medicines are reviewed at length with the patient today.  Concerns regarding medicines are outlined above.  No orders of the defined types were placed in this encounter.  No orders of the defined types were placed in this encounter.    Signed, Lars Mage, MD, [facility], [facility] 11/23/2021 11:11 AM    Electrophysiology [facility]

## 2021-11-23 NOTE — Patient Instructions (Signed)
Medications: Your physician recommends that you continue on your current medications as directed. Please refer to the Current Medication list given to you today. *If you need a refill on your cardiac medications before your next appointment, please call your pharmacy*  Lab Work: None. If you have labs (blood work) drawn today and your tests are completely normal, you will receive your results only by: MyChart Message (if you have MyChart) OR A paper copy in the mail If you have any lab test that is abnormal or we need to change your treatment, we will call you to review the results.  Testing/Procedures: None.  Follow-Up: At [facility], you and your health needs are our priority.  As part of our continuing mission to provide you with exceptional heart care, we have created designated Provider Care Teams.  These Care Teams include your primary Cardiologist (physician) and Advanced Practice Providers (APPs -  Physician Assistants and Nurse Practitioners) who all work together to provide you with the care you need, when you need it.  Your physician wants you to follow-up in: 12 months with Cameron Lambert, MD     You will receive a reminder letter in the mail two months in advance. If you don't receive a letter, please call our office to schedule the follow-up appointment.  We recommend signing up for the patient portal called "MyChart".  Sign up information is provided on this After Visit Summary.  MyChart is used to connect with patients for Virtual Visits (Telemedicine).  Patients are able to view lab/test results, encounter notes, upcoming appointments, etc.  Non-urgent messages can be sent to your provider as well.   To learn more about what you can do with MyChart, go to [email or web address].    Any Other Special Instructions Will Be Listed Below (If Applicable).  

## 2021-12-08 NOTE — Progress Notes (Signed)
Carelink Summary Report / Loop Recorder 

## 2021-12-21 DIAGNOSIS — M79643 Pain in unspecified hand: Secondary | ICD-10-CM | POA: Diagnosis not present

## 2021-12-21 DIAGNOSIS — M79646 Pain in unspecified finger(s): Secondary | ICD-10-CM | POA: Diagnosis not present

## 2021-12-21 DIAGNOSIS — M79642 Pain in left hand: Secondary | ICD-10-CM | POA: Diagnosis not present

## 2021-12-21 DIAGNOSIS — M79641 Pain in right hand: Secondary | ICD-10-CM | POA: Diagnosis not present

## 2021-12-21 DIAGNOSIS — M25641 Stiffness of right hand, not elsewhere classified: Secondary | ICD-10-CM | POA: Diagnosis not present

## 2021-12-21 DIAGNOSIS — R29818 Other symptoms and signs involving the nervous system: Secondary | ICD-10-CM | POA: Diagnosis not present

## 2021-12-21 DIAGNOSIS — R569 Unspecified convulsions: Secondary | ICD-10-CM | POA: Diagnosis not present

## 2021-12-21 DIAGNOSIS — M25642 Stiffness of left hand, not elsewhere classified: Secondary | ICD-10-CM | POA: Diagnosis not present

## 2021-12-21 DIAGNOSIS — R278 Other lack of coordination: Secondary | ICD-10-CM | POA: Diagnosis not present

## 2021-12-25 LAB — CUP PACEART REMOTE DEVICE CHECK
Date Time Interrogation Session: 20230715230123
Implantable Pulse Generator Implant Date: 20210503

## 2021-12-26 ENCOUNTER — Ambulatory Visit (INDEPENDENT_AMBULATORY_CARE_PROVIDER_SITE_OTHER): Payer: Medicare HMO

## 2021-12-26 DIAGNOSIS — R55 Syncope and collapse: Secondary | ICD-10-CM | POA: Diagnosis not present

## 2022-01-25 NOTE — Progress Notes (Signed)
Carelink Summary Report / Loop Recorder 

## 2022-01-29 LAB — CUP PACEART REMOTE DEVICE CHECK
Date Time Interrogation Session: 20230817231018
Implantable Pulse Generator Implant Date: 20210503

## 2022-01-30 ENCOUNTER — Ambulatory Visit (INDEPENDENT_AMBULATORY_CARE_PROVIDER_SITE_OTHER): Payer: Medicare Other

## 2022-01-30 DIAGNOSIS — R55 Syncope and collapse: Secondary | ICD-10-CM | POA: Diagnosis not present

## 2022-02-02 ENCOUNTER — Emergency Department: Payer: Medicare Other

## 2022-02-02 ENCOUNTER — Encounter: Payer: Self-pay | Admitting: Emergency Medicine

## 2022-02-02 DIAGNOSIS — I2510 Atherosclerotic heart disease of native coronary artery without angina pectoris: Secondary | ICD-10-CM | POA: Insufficient documentation

## 2022-02-02 DIAGNOSIS — Z951 Presence of aortocoronary bypass graft: Secondary | ICD-10-CM | POA: Diagnosis not present

## 2022-02-02 DIAGNOSIS — I519 Heart disease, unspecified: Secondary | ICD-10-CM | POA: Diagnosis not present

## 2022-02-02 DIAGNOSIS — M79602 Pain in left arm: Secondary | ICD-10-CM | POA: Insufficient documentation

## 2022-02-02 LAB — CBC
HCT: 32.4 % — ABNORMAL LOW (ref 39.0–52.0)
Hemoglobin: 10 g/dL — ABNORMAL LOW (ref 13.0–17.0)
MCH: 26.5 pg (ref 26.0–34.0)
MCHC: 30.9 g/dL (ref 30.0–36.0)
MCV: 85.9 fL (ref 80.0–100.0)
Platelets: 156 10*3/uL (ref 150–400)
RBC: 3.77 MIL/uL — ABNORMAL LOW (ref 4.22–5.81)
RDW: 16.9 % — ABNORMAL HIGH (ref 11.5–15.5)
WBC: 6 10*3/uL (ref 4.0–10.5)
nRBC: 0 % (ref 0.0–0.2)

## 2022-02-02 LAB — BASIC METABOLIC PANEL
Anion gap: 11 (ref 5–15)
BUN: 26 mg/dL — ABNORMAL HIGH (ref 8–23)
CO2: 27 mmol/L (ref 22–32)
Calcium: 9.7 mg/dL (ref 8.9–10.3)
Chloride: 101 mmol/L (ref 98–111)
Creatinine, Ser: 1.37 mg/dL — ABNORMAL HIGH (ref 0.61–1.24)
GFR, Estimated: 57 mL/min — ABNORMAL LOW (ref >60)
Glucose, Bld: 126 mg/dL — ABNORMAL HIGH (ref 70–99)
Potassium: 3.8 mmol/L (ref 3.5–5.1)
Sodium: 139 mmol/L (ref 135–145)

## 2022-02-02 LAB — TROPONIN I (HIGH SENSITIVITY)
Troponin I (High Sensitivity): 38 ng/L — ABNORMAL HIGH (ref <18)
Troponin I (High Sensitivity): 35 ng/L — ABNORMAL HIGH (ref <18)

## 2022-02-02 NOTE — ED Triage Notes (Addendum)
Patient to ED for left arm pain. Patient states same occurred in January and needed a heart cath. Patient denies CP at this time. Patient states he has taken a total of 3 Nitro today to help with arm pain.

## 2022-02-03 ENCOUNTER — Emergency Department
Admission: EM | Admit: 2022-02-03 | Discharge: 2022-02-03 | Disposition: A | Discharge status: Home / Self Care | Payer: Medicare Other | Attending: Emergency Medicine | Admitting: Emergency Medicine

## 2022-02-03 DIAGNOSIS — M79602 Pain in left arm: Secondary | ICD-10-CM

## 2022-02-03 NOTE — ED Provider Notes (Signed)
[facility] Provider Note    Event Date/Time   First MD Initiated Contact with Patient 02/03/22 [id number]     (approximate)   History   Arm Pain   HPI  Thomas Moore is a 66 y.o. male who presents to the ED for evaluation of Arm Pain   I reviewed cardiology clinic visit from 6/21.  History of CAD s/p CABG, CHF, TAVR  Patient presents to the ED for evaluation of 3 episodes of left arm pain that occurred during the day.  He reports pain occurred while at rest and 1 episode with ambulation.  Episode lasting minutes and self resolving.  No pain now.  Reports he is concerned about his heart as he has had cardiac issues in the past related to symptoms of just arm pain.  No other associated symptoms such as dizziness, dyspnea, cough, fever, syncope, falls or injuries.  Physical Exam   Triage Vital Signs: ED Triage Vitals  Enc Vitals Group     BP 02/02/22 1846 124/62     Pulse Rate 02/02/22 1846 81     Resp 02/02/22 1846 18     Temp 02/02/22 1846 98.2 F (36.8 C)     Temp Source 02/02/22 1846 Oral     SpO2 02/02/22 1846 96 %     Weight --      Height --      Head Circumference --      Peak Flow --      Pain Score 02/02/22 1847 2     Pain Loc --      Pain Edu? --      Excl. in GC? --     Most recent vital signs: Vitals:   02/03/22 0049 02/03/22 0223  BP: (!) 106/56 (!) 125/59  Pulse: 70 71  Resp: 17 18  Temp: 98.1 F (36.7 C) 98 F (36.7 C)  SpO2: 98% 96%    General: Awake, no distress.  CV:  Good peripheral perfusion. RRR Resp:  Normal effort.  Abd:  No distention.  MSK:  No deformity noted.  Neuro:  No focal deficits appreciated. Cranial nerves II through XII intact 5/5 strength and sensation in all 4 extremities Other:     ED Results / Procedures / Treatments   Labs (all labs ordered are listed, but only abnormal results are displayed) Labs Reviewed  BASIC METABOLIC PANEL - Abnormal; Notable for the following components:       Result Value   Glucose, Bld 126 (*)    BUN 26 (*)    Creatinine, Ser 1.37 (*)    GFR, Estimated 57 (*)    All other components within normal limits  CBC - Abnormal; Notable for the following components:   RBC 3.77 (*)    Hemoglobin 10.0 (*)    HCT 32.4 (*)    RDW 16.9 (*)    All other components within normal limits  TROPONIN I (HIGH SENSITIVITY) - Abnormal; Notable for the following components:   Troponin I (High Sensitivity) 38 (*)    All other components within normal limits  TROPONIN I (HIGH SENSITIVITY) - Abnormal; Notable for the following components:   Troponin I (High Sensitivity) 35 (*)    All other components within normal limits    EKG Sinus rhythm with a rate of 89 bpm.  Normal axis and intervals.  1 PVC.  Nonspecific ST changes laterally and inferiorly without STEMI. Similar morphology as previous EKGs from February with nonspecific ST  changes  RADIOLOGY CXR interpreted by me without evidence of acute cardiopulmonary pathology.  Official radiology report(s): DG Chest 2 View  Result Date: 02/02/2022 CLINICAL DATA:  Left arm pain. EXAM: CHEST - 2 VIEW COMPARISON:  July 01, 2021 FINDINGS: Multiple sternal wires and vascular clips are noted. The heart size and mediastinal contours are within normal limits. An artificial aortic valve is in place. A radiopaque loop recorder device is present. Both lungs are clear. Multilevel degenerative changes seen throughout the thoracic spine. IMPRESSION: 1. Evidence of prior median sternotomy/CABG and aortic valve replacement. 2. No acute cardiopulmonary disease. Electronically Signed   By: Aram Candela M.D.   On: 02/02/2022 19:20    PROCEDURES and INTERVENTIONS:  .1-3 Lead EKG Interpretation  Performed by: Delton Prairie, MD Authorized by: Delton Prairie, MD     Interpretation: normal     ECG rate:  70   ECG rate assessment: normal     Rhythm: sinus rhythm     Ectopy: none     Conduction: normal     Medications - No data  to display   IMPRESSION / MDM / ASSESSMENT AND PLAN / ED COURSE  I reviewed the triage vital signs and the nursing notes.  Differential diagnosis includes, but is not limited to, ACS, PTX, PNA, muscle strain/spasm, PE, dissection  {Patient presents with symptoms of an acute illness or injury that is potentially life-threatening.  66 year old male with cardiac history presents to the ED with atypical symptoms of left arm pain, without evidence of acute pathology and ultimately suitable for trial of outpatient management with close cardiology follow-up.  He looks systemically well and is asymptomatic here in the ED.  Has a benign work-up with marginal elevations of troponins, but are flat on repeat.  Nonischemic EKG.  CKD around baseline and chronic normocytic anemia are noted.  Clear CXR,.  I considered observation admission for this patient, but considering he has good outpatient follow-up we decided upon close cardiology follow-up with return precautions for the ED.      FINAL CLINICAL IMPRESSION(S) / ED DIAGNOSES   Final diagnoses:  Left arm pain     Rx / DC Orders   ED Discharge Orders     None        Note:  This document was prepared using Dragon voice recognition software and may include unintentional dictation errors.    Delton Prairie, MD 02/03/22 [phone]

## 2022-02-03 NOTE — Discharge Instructions (Signed)
Return to the ED with any worsening pain or pain that will not go away

## 2022-02-22 NOTE — Progress Notes (Signed)
Carelink Summary Report / Loop Recorder 

## 2022-03-06 ENCOUNTER — Ambulatory Visit (INDEPENDENT_AMBULATORY_CARE_PROVIDER_SITE_OTHER): Payer: Medicare Other

## 2022-03-06 DIAGNOSIS — I472 Ventricular tachycardia, unspecified: Secondary | ICD-10-CM | POA: Diagnosis not present

## 2022-03-06 DIAGNOSIS — R55 Syncope and collapse: Secondary | ICD-10-CM

## 2022-03-07 LAB — CUP PACEART REMOTE DEVICE CHECK
Date Time Interrogation Session: 20231001230610
Implantable Pulse Generator Implant Date: 20210503

## 2022-03-20 NOTE — Progress Notes (Signed)
Carelink Summary Report / Loop Recorder 

## 2022-04-10 ENCOUNTER — Ambulatory Visit (INDEPENDENT_AMBULATORY_CARE_PROVIDER_SITE_OTHER): Payer: Medicare Other

## 2022-04-10 DIAGNOSIS — R55 Syncope and collapse: Secondary | ICD-10-CM | POA: Diagnosis not present

## 2022-04-10 LAB — CUP PACEART REMOTE DEVICE CHECK
Date Time Interrogation Session: 20231105231205
Implantable Pulse Generator Implant Date: 20210503

## 2022-04-14 LAB — RESPIRATORY PANEL BY PCR

## 2022-05-09 NOTE — Progress Notes (Signed)
Carelink Summary Report / Loop Recorder 

## 2022-05-15 ENCOUNTER — Ambulatory Visit (INDEPENDENT_AMBULATORY_CARE_PROVIDER_SITE_OTHER): Payer: Medicare Other

## 2022-05-15 DIAGNOSIS — R55 Syncope and collapse: Secondary | ICD-10-CM | POA: Diagnosis not present

## 2022-05-15 LAB — CUP PACEART REMOTE DEVICE CHECK
Date Time Interrogation Session: 20231210231307
Implantable Pulse Generator Implant Date: 20210503

## 2022-05-26 ENCOUNTER — Inpatient Hospital Stay
Admission: EM | Admit: 2022-05-26 | Discharge: 2022-05-28 | DRG: 280 | Disposition: A | Discharge status: Home / Self Care | Payer: Medicare Other | Attending: Family Medicine | Admitting: Family Medicine

## 2022-05-26 ENCOUNTER — Emergency Department: Payer: Medicare Other

## 2022-05-26 ENCOUNTER — Other Ambulatory Visit: Payer: Self-pay

## 2022-05-26 DIAGNOSIS — E785 Hyperlipidemia, unspecified: Secondary | ICD-10-CM | POA: Diagnosis present

## 2022-05-26 DIAGNOSIS — Z952 Presence of prosthetic heart valve: Secondary | ICD-10-CM

## 2022-05-26 DIAGNOSIS — Z87891 Personal history of nicotine dependence: Secondary | ICD-10-CM | POA: Diagnosis not present

## 2022-05-26 DIAGNOSIS — I214 Non-ST elevation (NSTEMI) myocardial infarction: Principal | ICD-10-CM | POA: Diagnosis present

## 2022-05-26 DIAGNOSIS — Z86711 Personal history of pulmonary embolism: Secondary | ICD-10-CM | POA: Diagnosis not present

## 2022-05-26 DIAGNOSIS — I255 Ischemic cardiomyopathy: Secondary | ICD-10-CM | POA: Diagnosis present

## 2022-05-26 DIAGNOSIS — N183 Chronic kidney disease, stage 3 unspecified: Secondary | ICD-10-CM | POA: Diagnosis present

## 2022-05-26 DIAGNOSIS — R000 Tachycardia, unspecified: Secondary | ICD-10-CM | POA: Diagnosis present

## 2022-05-26 DIAGNOSIS — Z7902 Long term (current) use of antithrombotics/antiplatelets: Secondary | ICD-10-CM

## 2022-05-26 DIAGNOSIS — Z1152 Encounter for screening for COVID-19: Secondary | ICD-10-CM

## 2022-05-26 DIAGNOSIS — Z7901 Long term (current) use of anticoagulants: Secondary | ICD-10-CM | POA: Diagnosis not present

## 2022-05-26 DIAGNOSIS — E1165 Type 2 diabetes mellitus with hyperglycemia: Secondary | ICD-10-CM | POA: Diagnosis present

## 2022-05-26 DIAGNOSIS — E1122 Type 2 diabetes mellitus with diabetic chronic kidney disease: Secondary | ICD-10-CM | POA: Diagnosis present

## 2022-05-26 DIAGNOSIS — I4892 Unspecified atrial flutter: Secondary | ICD-10-CM | POA: Diagnosis present

## 2022-05-26 DIAGNOSIS — K219 Gastro-esophageal reflux disease without esophagitis: Secondary | ICD-10-CM | POA: Diagnosis present

## 2022-05-26 DIAGNOSIS — I082 Rheumatic disorders of both aortic and tricuspid valves: Secondary | ICD-10-CM | POA: Diagnosis present

## 2022-05-26 DIAGNOSIS — Z560 Unemployment, unspecified: Secondary | ICD-10-CM

## 2022-05-26 DIAGNOSIS — Z951 Presence of aortocoronary bypass graft: Secondary | ICD-10-CM | POA: Diagnosis not present

## 2022-05-26 DIAGNOSIS — I110 Hypertensive heart disease with heart failure: Secondary | ICD-10-CM | POA: Diagnosis present

## 2022-05-26 DIAGNOSIS — Z7984 Long term (current) use of oral hypoglycemic drugs: Secondary | ICD-10-CM

## 2022-05-26 DIAGNOSIS — Z87898 Personal history of other specified conditions: Secondary | ICD-10-CM

## 2022-05-26 DIAGNOSIS — Z947 Corneal transplant status: Secondary | ICD-10-CM

## 2022-05-26 DIAGNOSIS — J449 Chronic obstructive pulmonary disease, unspecified: Secondary | ICD-10-CM | POA: Diagnosis present

## 2022-05-26 DIAGNOSIS — I5043 Acute on chronic combined systolic (congestive) and diastolic (congestive) heart failure: Secondary | ICD-10-CM | POA: Diagnosis present

## 2022-05-26 DIAGNOSIS — R0789 Other chest pain: Principal | ICD-10-CM

## 2022-05-26 DIAGNOSIS — Z79899 Other long term (current) drug therapy: Secondary | ICD-10-CM | POA: Diagnosis not present

## 2022-05-26 DIAGNOSIS — Z8673 Personal history of transient ischemic attack (TIA), and cerebral infarction without residual deficits: Secondary | ICD-10-CM | POA: Diagnosis not present

## 2022-05-26 DIAGNOSIS — J9601 Acute respiratory failure with hypoxia: Secondary | ICD-10-CM | POA: Diagnosis present

## 2022-05-26 DIAGNOSIS — G40909 Epilepsy, unspecified, not intractable, without status epilepticus: Secondary | ICD-10-CM | POA: Diagnosis present

## 2022-05-26 DIAGNOSIS — I2510 Atherosclerotic heart disease of native coronary artery without angina pectoris: Secondary | ICD-10-CM | POA: Diagnosis present

## 2022-05-26 DIAGNOSIS — I10 Essential (primary) hypertension: Secondary | ICD-10-CM | POA: Diagnosis present

## 2022-05-26 DIAGNOSIS — E119 Type 2 diabetes mellitus without complications: Secondary | ICD-10-CM | POA: Diagnosis present

## 2022-05-26 LAB — BASIC METABOLIC PANEL
Anion gap: 9 (ref 5–15)
BUN: 19 mg/dL (ref 8–23)
CO2: 30 mmol/L (ref 22–32)
Calcium: 9 mg/dL (ref 8.9–10.3)
Chloride: 99 mmol/L (ref 98–111)
Creatinine, Ser: 1.3 mg/dL — ABNORMAL HIGH (ref 0.61–1.24)
GFR, Estimated: >60 mL/min (ref >60)
Glucose, Bld: 209 mg/dL — ABNORMAL HIGH (ref 70–99)
Potassium: 3.9 mmol/L (ref 3.5–5.1)
Sodium: 138 mmol/L (ref 135–145)

## 2022-05-26 LAB — CBC WITH DIFFERENTIAL/PLATELET
Abs Immature Granulocytes: 0.03 10*3/uL (ref 0.00–0.07)
Basophils Absolute: 0 10*3/uL (ref 0.0–0.1)
Basophils Relative: 0 %
Eosinophils Absolute: 0.1 10*3/uL (ref 0.0–0.5)
Eosinophils Relative: 1 %
HCT: 37.1 % — ABNORMAL LOW (ref 39.0–52.0)
Hemoglobin: 11.7 g/dL — ABNORMAL LOW (ref 13.0–17.0)
Immature Granulocytes: 0 %
Lymphocytes Relative: 9 %
Lymphs Abs: 0.7 10*3/uL (ref 0.7–4.0)
MCH: 27.6 pg (ref 26.0–34.0)
MCHC: 31.5 g/dL (ref 30.0–36.0)
MCV: 87.5 fL (ref 80.0–100.0)
Monocytes Absolute: 0.4 10*3/uL (ref 0.1–1.0)
Monocytes Relative: 5 %
Neutro Abs: 6.2 10*3/uL (ref 1.7–7.7)
Neutrophils Relative %: 85 %
Platelets: 155 10*3/uL (ref 150–400)
RBC: 4.24 MIL/uL (ref 4.22–5.81)
RDW: 15.1 % (ref 11.5–15.5)
WBC: 7.5 10*3/uL (ref 4.0–10.5)
nRBC: 0 % (ref 0.0–0.2)

## 2022-05-26 LAB — CBG MONITORING, ED
Glucose-Capillary: 240 mg/dL — ABNORMAL HIGH (ref 70–99)
Glucose-Capillary: 192 mg/dL — ABNORMAL HIGH (ref 70–99)

## 2022-05-26 LAB — COMPREHENSIVE METABOLIC PANEL
ALT: 12 U/L (ref 0–44)
AST: 23 U/L (ref 15–41)
Albumin: 3.5 g/dL (ref 3.5–5.0)
Alkaline Phosphatase: 105 U/L (ref 38–126)
Anion gap: 11 (ref 5–15)
BUN: 20 mg/dL (ref 8–23)
CO2: 26 mmol/L (ref 22–32)
Calcium: 9.1 mg/dL (ref 8.9–10.3)
Chloride: 102 mmol/L (ref 98–111)
Creatinine, Ser: 1.26 mg/dL — ABNORMAL HIGH (ref 0.61–1.24)
GFR, Estimated: >60 mL/min (ref >60)
Glucose, Bld: 250 mg/dL — ABNORMAL HIGH (ref 70–99)
Potassium: 4.1 mmol/L (ref 3.5–5.1)
Sodium: 139 mmol/L (ref 135–145)
Total Bilirubin: 0.8 mg/dL (ref 0.3–1.2)
Total Protein: 8.1 g/dL (ref 6.5–8.1)

## 2022-05-26 LAB — TROPONIN I (HIGH SENSITIVITY)
Troponin I (High Sensitivity): 66 ng/L — ABNORMAL HIGH (ref <18)
Troponin I (High Sensitivity): 1953 ng/L (ref <18)
Troponin I (High Sensitivity): 11470 ng/L (ref <18)
Troponin I (High Sensitivity): 13823 ng/L (ref <18)
Troponin I (High Sensitivity): 16723 ng/L (ref <18)
Troponin I (High Sensitivity): 15439 ng/L (ref <18)

## 2022-05-26 LAB — RESP PANEL BY RT-PCR (RSV, FLU A&B, COVID)  RVPGX2
Influenza A by PCR: NEGATIVE
Influenza B by PCR: NEGATIVE
Resp Syncytial Virus by PCR: NEGATIVE
SARS Coronavirus 2 by RT PCR: NEGATIVE

## 2022-05-26 LAB — PROTIME-INR
INR: 1.3 — ABNORMAL HIGH (ref 0.8–1.2)
Prothrombin Time: 15.9 seconds — ABNORMAL HIGH (ref 11.4–15.2)

## 2022-05-26 LAB — APTT
aPTT: 36 seconds (ref 24–36)
aPTT: 53 seconds — ABNORMAL HIGH (ref 24–36)

## 2022-05-26 LAB — LIPASE, BLOOD: Lipase: 50 U/L (ref 11–51)

## 2022-05-26 LAB — MAGNESIUM
Magnesium: 1.7 mg/dL (ref 1.7–2.4)
Magnesium: 2.4 mg/dL (ref 1.7–2.4)

## 2022-05-26 LAB — HEPARIN LEVEL (UNFRACTIONATED): Heparin Unfractionated: >1.1 IU/mL — ABNORMAL HIGH (ref 0.30–0.70)

## 2022-05-26 LAB — HIV ANTIBODY (ROUTINE TESTING W REFLEX): HIV Screen 4th Generation wRfx: NONREACTIVE

## 2022-05-26 LAB — BRAIN NATRIURETIC PEPTIDE: B Natriuretic Peptide: 830 pg/mL — ABNORMAL HIGH (ref 0.0–100.0)

## 2022-05-26 MED ORDER — INSULIN ASPART 100 UNIT/ML IJ SOLN
0.0000 [IU] | Freq: Three times a day (TID) | INTRAMUSCULAR | Status: DC
  Administered 2022-05-26: 5 [IU] via SUBCUTANEOUS
  Administered 2022-05-27: 3 [IU] via SUBCUTANEOUS
  Administered 2022-05-27: 5 [IU] via SUBCUTANEOUS
  Administered 2022-05-28: 3 [IU] via SUBCUTANEOUS
  Filled 2022-05-26 (×5): qty 1

## 2022-05-26 MED ORDER — ATORVASTATIN CALCIUM 20 MG PO TABS
40.0000 mg | ORAL_TABLET | Freq: Every day | ORAL | Status: DC
  Administered 2022-05-26: 40 mg via ORAL
  Filled 2022-05-26: qty 2

## 2022-05-26 MED ORDER — ASPIRIN 81 MG PO TBEC
81.0000 mg | DELAYED_RELEASE_TABLET | Freq: Every day | ORAL | Status: DC
  Administered 2022-05-27 – 2022-05-28 (×2): 81 mg via ORAL
  Filled 2022-05-26 (×2): qty 1

## 2022-05-26 MED ORDER — METOPROLOL SUCCINATE ER 100 MG PO TB24
100.0000 mg | ORAL_TABLET | Freq: Every day | ORAL | Status: DC
Start: 2022-05-26 — End: 2022-05-28
  Administered 2022-05-26 – 2022-05-28 (×3): 100 mg via ORAL
  Filled 2022-05-26 (×2): qty 2
  Filled 2022-05-26: qty 1

## 2022-05-26 MED ORDER — ADULT MULTIVITAMIN W/MINERALS CH
1.0000 | ORAL_TABLET | Freq: Every day | ORAL | Status: DC
  Administered 2022-05-26 – 2022-05-28 (×3): 1 via ORAL
  Filled 2022-05-26 (×3): qty 1

## 2022-05-26 MED ORDER — CYCLOBENZAPRINE HCL 10 MG PO TABS: 5.0000 mg | ORAL_TABLET | Freq: Three times a day (TID) | ORAL | Status: DC | PRN

## 2022-05-26 MED ORDER — ASPIRIN 81 MG PO CHEW
324.0000 mg | CHEWABLE_TABLET | Freq: Once | ORAL | Status: AC | PRN
  Administered 2022-05-26: 324 mg via ORAL
  Filled 2022-05-26: qty 4

## 2022-05-26 MED ORDER — ACETAMINOPHEN 500 MG PO TABS: 1000.0000 mg | ORAL_TABLET | Freq: Three times a day (TID) | ORAL | Status: DC | PRN

## 2022-05-26 MED ORDER — HEPARIN (PORCINE) 25000 UT/250ML-% IV SOLN
1000.0000 [IU]/h | INTRAVENOUS | Status: DC
  Administered 2022-05-26: 800 [IU]/h via INTRAVENOUS
  Filled 2022-05-26 (×2): qty 250

## 2022-05-26 MED ORDER — MAGNESIUM SULFATE 2 GM/50ML IV SOLN
2.0000 g | Freq: Once | INTRAVENOUS | Status: AC
  Administered 2022-05-26: 2 g via INTRAVENOUS
  Filled 2022-05-26: qty 50

## 2022-05-26 MED ORDER — PANTOPRAZOLE SODIUM 40 MG PO TBEC
40.0000 mg | DELAYED_RELEASE_TABLET | Freq: Every day | ORAL | Status: DC
  Administered 2022-05-26 – 2022-05-28 (×3): 40 mg via ORAL
  Filled 2022-05-26 (×3): qty 1

## 2022-05-26 MED ORDER — FUROSEMIDE 10 MG/ML IJ SOLN
40.0000 mg | Freq: Once | INTRAMUSCULAR | Status: AC
  Administered 2022-05-26: 40 mg via INTRAVENOUS
  Filled 2022-05-26: qty 4

## 2022-05-26 MED ORDER — DIVALPROEX SODIUM ER 250 MG PO TB24
250.0000 mg | ORAL_TABLET | Freq: Every day | ORAL | Status: DC
  Administered 2022-05-26 – 2022-05-27 (×2): 250 mg via ORAL
  Filled 2022-05-26 (×2): qty 1

## 2022-05-26 MED ORDER — NITROGLYCERIN 0.4 MG SL SUBL
0.4000 mg | SUBLINGUAL_TABLET | SUBLINGUAL | Status: DC | PRN
  Administered 2022-05-26: 0.4 mg via SUBLINGUAL
  Filled 2022-05-26: qty 1

## 2022-05-26 MED ORDER — RAMIPRIL 2.5 MG PO CAPS
2.5000 mg | ORAL_CAPSULE | Freq: Every day | ORAL | Status: DC
  Administered 2022-05-26 – 2022-05-28 (×3): 2.5 mg via ORAL
  Filled 2022-05-26 (×5): qty 1

## 2022-05-26 MED ORDER — OYSTER SHELL CALCIUM/D3 500-5 MG-MCG PO TABS
1.0000 | ORAL_TABLET | Freq: Two times a day (BID) | ORAL | Status: DC
  Administered 2022-05-26 – 2022-05-28 (×4): 1 via ORAL
  Filled 2022-05-26 (×4): qty 1

## 2022-05-26 MED ORDER — HEPARIN BOLUS VIA INFUSION
2000.0000 [IU] | Freq: Once | INTRAVENOUS | Status: AC
  Administered 2022-05-26: 2000 [IU] via INTRAVENOUS
  Filled 2022-05-26: qty 2000

## 2022-05-26 MED ORDER — HEPARIN (PORCINE) 25000 UT/250ML-% IV SOLN: 800.0000 [IU]/h | INTRAVENOUS | Status: DC

## 2022-05-26 MED ORDER — ONDANSETRON HCL 4 MG/2ML IJ SOLN: 4.0000 mg | Freq: Four times a day (QID) | INTRAMUSCULAR | Status: DC | PRN

## 2022-05-26 MED ORDER — METOPROLOL SUCCINATE ER 50 MG PO TB24: 100.0000 mg | ORAL_TABLET | Freq: Every day | ORAL | Status: DC

## 2022-05-26 MED ORDER — FUROSEMIDE 10 MG/ML IJ SOLN
40.0000 mg | Freq: Every day | INTRAMUSCULAR | Status: DC
  Administered 2022-05-28: 40 mg via INTRAVENOUS
  Filled 2022-05-26 (×2): qty 4

## 2022-05-26 MED ORDER — CLOPIDOGREL BISULFATE 75 MG PO TABS
75.0000 mg | ORAL_TABLET | Freq: Every day | ORAL | Status: DC
  Administered 2022-05-26: 75 mg via ORAL
  Filled 2022-05-26 (×2): qty 1

## 2022-05-26 MED ORDER — NITROGLYCERIN 0.4 MG SL SUBL: 0.4000 mg | SUBLINGUAL_TABLET | SUBLINGUAL | Status: DC | PRN

## 2022-05-26 MED ORDER — ISOSORBIDE MONONITRATE ER 30 MG PO TB24
30.0000 mg | ORAL_TABLET | Freq: Every day | ORAL | Status: DC
  Administered 2022-05-26 – 2022-05-28 (×3): 30 mg via ORAL
  Filled 2022-05-26 (×3): qty 1

## 2022-05-26 MED ORDER — ACETAMINOPHEN ER 650 MG PO TBCR: 1300.0000 mg | EXTENDED_RELEASE_TABLET | Freq: Three times a day (TID) | ORAL | Status: DC | PRN

## 2022-05-26 MED ORDER — ACETAMINOPHEN 325 MG PO TABS: 650.0000 mg | ORAL_TABLET | ORAL | Status: DC | PRN

## 2022-05-26 MED ORDER — NITROGLYCERIN 0.4 MG SL SUBL
0.4000 mg | SUBLINGUAL_TABLET | SUBLINGUAL | Status: DC | PRN
  Administered 2022-05-28 (×3): 0.4 mg via SUBLINGUAL
  Filled 2022-05-26: qty 1

## 2022-05-26 NOTE — ED Notes (Signed)
Date and time results received: 05/26/22 1048 (use smartphrase ".now" to insert current time)  Test: Trop Critical Value: 1953  Name of Provider Notified: Modesto Charon

## 2022-05-26 NOTE — ED Notes (Signed)
Updated md about trop, per md will continue to monitor with serial trops

## 2022-05-26 NOTE — Assessment & Plan Note (Signed)
Hold Xarelto while patient is on heparin drip

## 2022-05-26 NOTE — Consult Note (Signed)
Thomas Moore is a 66 y.o. male  [id number]  Primary Cardiologist: Adrian Blackwater Reason for Consultation: Left arm pain and non-STEMI  HPI: 66 year old white male with a past medical history of coronary artery disease status post four-vessel CABG at [facility], TAVR presented to the hospital with left arm pain and shortness of breath.  Patient took Xarelto dose last night around 6 PM.   Review of Systems: No chest pain orthopnea or PND or leg swelling   Past Medical History:  Diagnosis Date   CAD (coronary artery disease) of artery bypass graft    s/p CABG x 4 in 1997   COPD (chronic obstructive pulmonary disease) (HCC)    Coronary artery disease    CVA (cerebral vascular accident) (HCC)    Diabetes mellitus without complication (HCC)    Dysrhythmia    GERD (gastroesophageal reflux disease)    HLD (hyperlipidemia)    Hypertension    S/P TAVR (transcatheter aortic valve replacement) 07/05/2021   with Edwards 55mm S3UR via TF approach with Dr. Excell Seltzer and Dr. Laneta Simmers   Seizures [facility])     (Not in a hospital admission)     [START ON 05/27/2022] aspirin EC  81 mg Oral Daily   atorvastatin  40 mg Oral QHS   calcium-vitamin D  1 tablet Oral BID WC   clopidogrel  75 mg Oral Daily   divalproex  250 mg Oral QHS   [START ON 05/27/2022] furosemide  40 mg Intravenous Daily   insulin aspart  0-15 Units Subcutaneous TID WC   isosorbide mononitrate  30 mg Oral Daily   metoprolol succinate  100 mg Oral Daily   multivitamin with minerals  1 tablet Oral Daily   pantoprazole  40 mg Oral Daily   ramipril  2.5 mg Oral Daily    Infusions:  heparin     magnesium sulfate bolus IVPB      No Known Allergies  Social History   Socioeconomic History   Marital status: Single    Spouse name: Not on file   Number of children: 0   Years of education: Not on file   Highest education level: High school graduate  Occupational History   Occupation: Unemployed    Comment: Trying to get  disability  Tobacco Use   Smoking status: Former    Types: Cigarettes    Quit date: 1984    Years since quitting: 40.0   Smokeless tobacco: Never   Tobacco comments:    Quit 40 years ago  Psychologist, educational Use   Vaping Use: Never used  Substance and Sexual Activity   Alcohol use: Not Currently   Drug use: Never   Sexual activity: Never  Other Topics Concern   Not on file  Social History Narrative   ** Merged History Encounter **    Lives in an apartment on [address], has to climb 17 floors   Social Determinants of Health   Financial Resource Strain: Not on file  Food Insecurity: Not on file  Transportation Needs: Unmet Transportation Needs (02/19/2020)   PRAPARE - Transportation    Lack of Transportation (Medical): Yes    Lack of Transportation (Non-Medical): Yes  Physical Activity: Not on file  Stress: No Stress Concern Present (02/19/2020)   [employer] of Occupational Health - Occupational Stress Questionnaire    Feeling of Stress : Not at all  Social Connections: Not on file  Intimate Partner Violence: Not on file    Family History  Problem  Relation Age of Onset   Seizures Brother     PHYSICAL EXAM: Vitals:   05/26/22 1000 05/26/22 1100  BP: 137/76 (!) 146/89  Pulse: 95 98  Resp: 15 18  Temp:    SpO2: 100% 100%    No intake or output data in the 24 hours ending 05/26/22 1220  General:  Well appearing. No respiratory difficulty HEENT: normal Neck: supple. no JVD. Carotids 2+ bilat; no bruits. No lymphadenopathy or thryomegaly appreciated. Cor: PMI nondisplaced. Regular rate & rhythm. No rubs, gallops or murmurs. Lungs: clear Abdomen: soft, nontender, nondistended. No hepatosplenomegaly. No bruits or masses. Good bowel sounds. Extremities: no cyanosis, clubbing, rash, edema Neuro: alert & oriented x 3, cranial nerves grossly intact. moves all 4 extremities w/o difficulty. Affect pleasant.  ECG: Normal sinus rhythm nonspecific ST-T changes  Results for  orders placed or performed during the hospital encounter of 05/26/22 (from the past 24 hour(s))  Resp panel by RT-PCR (RSV, Flu A&B, Covid) Anterior Nasal Swab     Status: None   Collection Time: 05/26/22  7:34 AM   Specimen: Anterior Nasal Swab  Result Value Ref Range   SARS Coronavirus 2 by RT PCR NEGATIVE NEGATIVE   Influenza A by PCR NEGATIVE NEGATIVE   Influenza B by PCR NEGATIVE NEGATIVE   Resp Syncytial Virus by PCR NEGATIVE NEGATIVE  Magnesium     Status: None   Collection Time: 05/26/22  7:35 AM  Result Value Ref Range   Magnesium 1.7 1.7 - 2.4 mg/dL  CBC with Differential/Platelet     Status: Abnormal   Collection Time: 05/26/22  7:35 AM  Result Value Ref Range   WBC 7.5 4.0 - 10.5 K/uL   RBC 4.24 4.22 - 5.81 MIL/uL   Hemoglobin 11.7 (L) 13.0 - 17.0 g/dL   HCT 59.5 (L) 63.8 - 75.6 %   MCV 87.5 80.0 - 100.0 fL   MCH 27.6 26.0 - 34.0 pg   MCHC 31.5 30.0 - 36.0 g/dL   RDW 43.3 29.5 - 18.8 %   Platelets 155 150 - 400 K/uL   nRBC 0.0 0.0 - 0.2 %   Neutrophils Relative % 85 %   Neutro Abs 6.2 1.7 - 7.7 K/uL   Lymphocytes Relative 9 %   Lymphs Abs 0.7 0.7 - 4.0 K/uL   Monocytes Relative 5 %   Monocytes Absolute 0.4 0.1 - 1.0 K/uL   Eosinophils Relative 1 %   Eosinophils Absolute 0.1 0.0 - 0.5 K/uL   Basophils Relative 0 %   Basophils Absolute 0.0 0.0 - 0.1 K/uL   Immature Granulocytes 0 %   Abs Immature Granulocytes 0.03 0.00 - 0.07 K/uL  Comprehensive metabolic panel     Status: Abnormal   Collection Time: 05/26/22  7:35 AM  Result Value Ref Range   Sodium 139 135 - 145 mmol/L   Potassium 4.1 3.5 - 5.1 mmol/L   Chloride 102 98 - 111 mmol/L   CO2 26 22 - 32 mmol/L   Glucose, Bld 250 (H) 70 - 99 mg/dL   BUN 20 8 - 23 mg/dL   Creatinine, Ser 4.16 (H) 0.61 - 1.24 mg/dL   Calcium 9.1 8.9 - 60.6 mg/dL   Total Protein 8.1 6.5 - 8.1 g/dL   Albumin 3.5 3.5 - 5.0 g/dL   AST 23 15 - 41 U/L   ALT 12 0 - 44 U/L   Alkaline Phosphatase 105 38 - 126 U/L   Total Bilirubin 0.8  0.3 - 1.2 mg/dL  GFR, Estimated >60 >60 mL/min   Anion gap 11 5 - 15  Lipase, blood     Status: None   Collection Time: 05/26/22  7:35 AM  Result Value Ref Range   Lipase 50 11 - 51 U/L  Troponin I (High Sensitivity)     Status: Abnormal   Collection Time: 05/26/22  7:35 AM  Result Value Ref Range   Troponin I (High Sensitivity) 66 (H) <18 ng/L  Troponin I (High Sensitivity)     Status: Abnormal   Collection Time: 05/26/22  9:47 AM  Result Value Ref Range   Troponin I (High Sensitivity) 1,953 (HH) <18 ng/L  Heparin level (unfractionated)     Status: Abnormal   Collection Time: 05/26/22 10:58 AM  Result Value Ref Range   Heparin Unfractionated >1.10 (H) 0.30 - 0.70 IU/mL  APTT     Status: None   Collection Time: 05/26/22 11:00 AM  Result Value Ref Range   aPTT 36 24 - 36 seconds  Protime-INR     Status: Abnormal   Collection Time: 05/26/22 11:00 AM  Result Value Ref Range   Prothrombin Time 15.9 (H) 11.4 - 15.2 seconds   INR 1.3 (H) 0.8 - 1.2   DG Chest Port 1 View  Result Date: 05/26/2022 CLINICAL DATA:  Chest pain radiating to left arm. Hypoxia. Coronary artery disease. EXAM: PORTABLE CHEST 1 VIEW COMPARISON:  02/02/2022 FINDINGS: Prior CABG and aortic valve replacement noted. Cardiac loop recorder again seen. Stable mild cardiomegaly. Increased interstitial infiltrates seen in the lung bases, suspicious for interstitial edema. Pulmonary hyperinflation again seen, consistent with COPD. IMPRESSION: Increased bilateral lower lung interstitial infiltrates, suspicious for interstitial edema/congestive heart failure. COPD. Electronically Signed   By: Danae Orleans M.D.   On: 05/26/2022 07:26     ASSESSMENT AND PLAN: Non-STEMI with elevated troponin 1900 with mostly left arm pain and no chest pain.  Patient has history of atrial flutter and is on Xarelto and took last dose 6 PM last night which will be 12 hours ago.  Unfortunately will not be able to do cardiac catheterization because  Xarelto has to be held for 48 hours.  Advised treating the patient with IV heparin and Plavix.  Will see if he needs cardiac catheterization and needs to stay over the weekend.  He has extensive coronary artery disease and probably will treat medically.  Menna Abeln A

## 2022-05-26 NOTE — Consult Note (Signed)
ANTICOAGULATION CONSULT NOTE - Initial Consult  Pharmacy Consult for heparin infusion Indication: chest pain/ACS  No Known Allergies  Patient Measurements: Height: 5\' 3"  (160 cm) Weight: 68 kg (150 lb) IBW/kg (Calculated) : 56.9 Heparin Dosing Weight: 68 kg  Vital Signs: Temp: 97.8 F (36.6 C) (12/22 1646) Temp Source: Oral (12/22 1646) BP: 119/73 (12/22 1800) Pulse Rate: 77 (12/22 1800)  Labs: Recent Labs    05/26/22 0735 05/26/22 0947 05/26/22 1058 05/26/22 1100 05/26/22 1334 05/26/22 1436 05/26/22 1713 05/26/22 1717  HGB 11.7*  --   --   --   --   --   --   --   HCT 37.1*  --   --   --   --   --   --   --   PLT 155  --   --   --   --   --   --   --   APTT  --   --   --  36  --   --   --  53*  LABPROT  --   --   --  15.9*  --   --   --   --   INR  --   --   --  1.3*  --   --   --   --   HEPARINUNFRC  --   --  >1.10*  --   --   --   --   --   CREATININE 1.26*  --   --   --   --  1.30*  --   --   TROPONINIHS 66*   < >  --   --  11,470* 05/28/22* 16,723*  --    < > = values in this interval not displayed.     Estimated Creatinine Clearance: 45 mL/min (A) (by C-G formula based on SCr of 1.3 mg/dL (H)).   Medical History: Past Medical History:  Diagnosis Date   CAD (coronary artery disease) of artery bypass graft    s/p CABG x 4 in 1997   COPD (chronic obstructive pulmonary disease) (HCC)    Coronary artery disease    CVA (cerebral vascular accident) (HCC)    Diabetes mellitus without complication (HCC)    Dysrhythmia    GERD (gastroesophageal reflux disease)    HLD (hyperlipidemia)    Hypertension    S/P TAVR (transcatheter aortic valve replacement) 07/05/2021   with Edwards 52mm S3UR via TF approach with Dr. 38m and Dr. Excell Seltzer   Seizures [facility])     Medications:  Patient on Xarelto 20mg  daily prior to admission. Last dose 12/21 at 1800 (per patient recollection)  Assessment: Patient admitted to ED with arm pain and elevated troponin. PMH includes   CAD with history of bypass, CVA, history of TAVR, hyperlipidemia, hypertension, diabetes, COPD. Pharmacy consulted to manage heparin infusion for ACS.  Goal of Therapy:  Heparin level 0.3-0.7 units/ml aPTT 66-102 seconds Monitor platelets by anticoagulation protocol: Yes   Plan:  12/22@1717 : aPTT 53, SUBtherapetic Bolus 2000 units x 1 and increase heparin infusion to 1000 units/hr Recheck aPTT 6 hours after rate change and heparin levels daily. Will transition to heparin level dosing once both levels correlate Continue to monitor H&H and platelets  , PharmD Clinical Pharmacist 05/26/2022 7:03 PM

## 2022-05-26 NOTE — ED Notes (Signed)
Pt reports a slight decrease in pain in his L arm.

## 2022-05-26 NOTE — Assessment & Plan Note (Signed)
-   Supplement magnesium 

## 2022-05-26 NOTE — Assessment & Plan Note (Signed)
Patient with complaints of shortness of breath and a nonproductive cough He was hypoxic by EMS with room air pulse oximetry of 85% Last known LVEF of 40 to 45% from a 2D echocardiogram which was done 03/23 X-ray shows findings concerning for acute CHF We will obtain BNP levels Start patient on Lasix 40 mg IV daily Continue metoprolol and ramipril Oxygen supplementation to maintain pulse oximetry greater than 92% Follow-up results of 2D echocardiogram to assess LVEF and rule out regional wall motion abnormality

## 2022-05-26 NOTE — ED Notes (Signed)
Pt in bed, pt up to the bathroom several times, pt reports decreased pain.  Resps even and unlabored, pt talking in full sentences.

## 2022-05-26 NOTE — Assessment & Plan Note (Signed)
Continue metoprolol and ramipril. 

## 2022-05-26 NOTE — Assessment & Plan Note (Signed)
Continue Depakote Place patient on seizure precautions

## 2022-05-26 NOTE — ED Notes (Signed)
Pt in bed, pt monitor was alarming, checked previous alarms on monitor, pt appears to have had an 11 beat run of an svt like rhythm that resolved into sinus rhythm.  Pt also appears to have had a 5 beat run of v tach at 1328 that also resolved into sinus rhythm., md notified.

## 2022-05-26 NOTE — ED Triage Notes (Signed)
Pt arrives [facility] from home with reports of chest pain and pain into left arm. Pt hypoxic with EMS. Pt arrives 85% on RA. Pt alert and oriented on arrival and describes the pain as discomfort. Pt reports taking nitro at home and reports it helped some with the pain.

## 2022-05-26 NOTE — Consult Note (Addendum)
ANTICOAGULATION CONSULT NOTE - Initial Consult  Pharmacy Consult for heparin infusion Indication: chest pain/ACS  No Known Allergies  Patient Measurements: Height: 5\' 3"  (160 cm) Weight: 68 kg (150 lb) IBW/kg (Calculated) : 56.9 Heparin Dosing Weight: 68 kg  Vital Signs: Temp: 98.5 F (36.9 C) (12/22 0837) Temp Source: Oral (12/22 0837) BP: 146/89 (12/22 1100) Pulse Rate: 98 (12/22 1100)  Labs: Recent Labs    05/26/22 0735 05/26/22 0947  HGB 11.7*  --   HCT 37.1*  --   PLT 155  --   CREATININE 1.26*  --   TROPONINIHS 66* 1,953*    Estimated Creatinine Clearance: 46.4 mL/min (A) (by C-G formula based on SCr of 1.26 mg/dL (H)).   Medical History: Past Medical History:  Diagnosis Date   CAD (coronary artery disease) of artery bypass graft    s/p CABG x 4 in 1997   COPD (chronic obstructive pulmonary disease) (HCC)    Coronary artery disease    CVA (cerebral vascular accident) (HCC)    Diabetes mellitus without complication (HCC)    Dysrhythmia    GERD (gastroesophageal reflux disease)    HLD (hyperlipidemia)    Hypertension    S/P TAVR (transcatheter aortic valve replacement) 07/05/2021   with Edwards 24mm S3UR via TF approach with Dr. 38m and Dr. Excell Seltzer   Seizures [facility])     Medications:  Patient on Xarelto 20mg  daily prior to admission. Last dose 12/21 at 1800 (per patient recollection)  Assessment: Patient admitted to ED with arm pain and elevated troponin. PMH includes  CAD with history of bypass, CVA, history of TAVR, hyperlipidemia, hypertension, diabetes, COPD. Pharmacy consulted to manage heparin infusion for ACS.  Goal of Therapy:  Heparin level 0.3-0.7 units/ml aPTT 66-102 seconds Monitor platelets by anticoagulation protocol: Yes   Plan:  NO BOLUS due to Xarelto dose < 24 hrs ago Start heparin infusion today 12/22 @ 1200 at 800 units/hr  Check aPTT  6 hours after starting heparin infusion Continue to monitor H&H and platelets  Vlad Mayberry  Rodriguez-Guzman PharmD, BCPS 05/26/2022 11:19 AM

## 2022-05-26 NOTE — H&P (Signed)
History and Physical    Patient: Thomas Moore OXB:[id number] DOB: 02/16/1956 DOA: 05/26/2022 DOS: the patient was seen and examined on 05/26/2022 PCP: Lauro Regulus, MD  Patient coming from: Home  Chief Complaint:  Chief Complaint  Patient presents with   Chest Pain   HPI: Thomas Moore is a 66 y.o. male with medical history significant for coronary artery disease status post CABG, history of aortic stenosis status post TAVR, history of seizure disorder, PE on Xarelto who presents to the ED for evaluation of left arm pain which he rated an 8 x 10 in intensity at its worst.  He described it as a persistent pain which prompted him to come to the ER for evaluation.  Patient states that he has had left arm pain intermittently for a while and had a recent stress test which was negative.  He also complains of chest pain over the left anterior chest wall but states that " it does not bother him as much as the left arm pain".  He had taken some nitroglycerin at home with some improvement in his symptoms.  He has had shortness of breath and states that for the last several days he has had a cold.  Shortness of breath is associated with a nonproductive cough. Per EMS he had room air pulse oximetry of 85%. He denies having any nausea, no vomiting, no diaphoresis or palpitations, no abdominal pain, no changes in his bowel habits, no dizziness, no lightheadedness, no blurred, vision no focal deficit. Abnormal labs including uptrending troponin level 66 >> 1953 Chest x-ray reviewed by me shows increased bilateral lower lung interstitial infiltrates, suspicious for interstitial edema/congestive heart failure. Twelve-lead EKG reviewed by me shows sinus tachycardia with diffuse ST depressions Patient was started on a heparin drip for non-ST elevation MI and will be admitted to the hospital for further evaluation.  Review of Systems: As mentioned in the history of present illness. All other systems  reviewed and are negative. Past Medical History:  Diagnosis Date   CAD (coronary artery disease) of artery bypass graft    s/p CABG x 4 in 1997   COPD (chronic obstructive pulmonary disease) (HCC)    Coronary artery disease    CVA (cerebral vascular accident) (HCC)    Diabetes mellitus without complication (HCC)    Dysrhythmia    GERD (gastroesophageal reflux disease)    HLD (hyperlipidemia)    Hypertension    S/P TAVR (transcatheter aortic valve replacement) 07/05/2021   with Edwards 32mm S3UR via TF approach with Dr. Excell Seltzer and Dr. Laneta Simmers   Seizures [facility])    Past Surgical History:  Procedure Laterality Date   BYPASS GRAFT ANGIOGRAPHY N/A 04/25/2021   Procedure: BYPASS GRAFT ANGIOGRAPHY;  Surgeon: Armando Reichert, MD;  Location: [facility] INVASIVE CV LAB;  Service: Cardiovascular;  Laterality: N/A;   CARDIAC SURGERY     CORNEAL TRANSPLANT Right    CORONARY ARTERY BYPASS GRAFT  1997   ELECTROPHYSIOLOGY STUDY N/A 09/08/2021   Procedure: ELECTROPHYSIOLOGY STUDY;  Surgeon: Lanier Prude, MD;  Location: [facility] INVASIVE CV LAB;  Service: Cardiovascular;  Laterality: N/A;   EYE SURGERY     FLEXOR TENDON REPAIR Left 07/11/2019   Procedure: FLEXOR tenolysis  REPAIR LEFT RING FINGER with tednon repair;  Surgeon: Kennedy Bucker, MD;  Location: [facility] ORS;  Service: Orthopedics;  Laterality: Left;   INCISION AND DRAINAGE ABSCESS Left 05/08/2019   Procedure: INCISION AND DRAINAGE ABSCESS;  Surgeon: Donato Heinz, MD;  Location: [facility]  ORS;  Service: Orthopedics;  Laterality: Left;   INTRAOPERATIVE TRANSTHORACIC ECHOCARDIOGRAM N/A 07/05/2021   Procedure: INTRAOPERATIVE TRANSTHORACIC ECHOCARDIOGRAM;  Surgeon: Tonny Bollman, MD;  Location: [facility] OR;  Service: Open Heart Surgery;  Laterality: N/A;   LEFT HEART CATH AND CORS/GRAFTS ANGIOGRAPHY N/A 10/02/2019   Procedure: LEFT HEART CATH AND CORS/GRAFTS ANGIOGRAPHY;  Surgeon: Marykay Lex, MD;  Location: [facility] INVASIVE CV LAB;  Service: Cardiovascular;   Laterality: N/A;   LOOP RECORDER INSERTION N/A 10/06/2019   Procedure: LOOP RECORDER INSERTION;  Surgeon: Marinus Maw, MD;  Location: [facility] INVASIVE CV LAB;  Service: Cardiovascular;  Laterality: N/A;   TRANSCATHETER AORTIC VALVE REPLACEMENT, TRANSFEMORAL N/A 07/05/2021   Procedure: TRANSCATHETER AORTIC VALVE REPLACEMENT, TRANSFEMORAL;  Surgeon: Tonny Bollman, MD;  Location: [facility] OR;  Service: Open Heart Surgery;  Laterality: N/A;   TRIGGER FINGER RELEASE Left    Social History:  reports that he quit smoking about 40 years ago. His smoking use included cigarettes. He has never used smokeless tobacco. He reports that he does not currently use alcohol. He reports that he does not use drugs.  No Known Allergies  Family History  Problem Relation Age of Onset   Seizures Brother     Prior to Admission medications   Medication Sig Start Date End Date Taking? Authorizing Provider  acetaminophen (TYLENOL) 650 MG CR tablet Take 1,300 mg by mouth every 8 (eight) hours as needed for pain.    [provider]  amoxicillin (AMOXIL) 500 MG tablet TAKE 2000 MG (4 TABLETS) 1 HOUR BEFORE DENTAL PROCEDURES 07/15/21   Janetta Hora, PA-C  atorvastatin (LIPITOR) 40 MG tablet TAKE ONE TABLET BY MOUTH AT BEDTIME 03/25/20   Iloabachie, Chioma E, NP  Calcium Citrate-Vitamin D (CALCIUM + D PO) Take 1 tablet by mouth 2 (two) times daily.    [provider]  clopidogrel (PLAVIX) 75 MG tablet Take 1 tablet (75 mg total) by mouth daily. 09/09/21 09/09/22  Lanier Prude, MD  cyclobenzaprine (FLEXERIL) 5 MG tablet Take 5 mg by mouth 3 (three) times daily as needed for muscle spasms. 07/14/21   [provider]  divalproex (DEPAKOTE ER) 250 MG 24 hr tablet Take 250 mg by mouth at bedtime. 01/26/21   [provider]  furosemide (LASIX) 20 MG tablet Take 1 tablet (20 mg total) by mouth daily. 04/28/21   Lurene Shadow, MD  glipiZIDE (GLUCOTROL XL) 2.5 MG 24 hr tablet Take 2.5 mg by mouth  daily with breakfast.    [provider]  ipratropium (ATROVENT) 0.06 % nasal spray Place 2 sprays into both nostrils daily as needed for rhinitis. 12/13/20   [provider]  isosorbide mononitrate (IMDUR) 30 MG 24 hr tablet Take 30 mg by mouth daily. 12/28/20   [provider]  metFORMIN (GLUCOPHAGE-XR) 500 MG 24 hr tablet TAKE TWO TABLETS BY MOUTH 2 TIMES A DAY 03/25/20   Iloabachie, Chioma E, NP  metoprolol succinate (TOPROL-XL) 100 MG 24 hr tablet Take 1 tablet (100 mg total) by mouth daily. 03/25/20   Iloabachie, Chioma E, NP  Multiple Vitamin (MULTIVITAMIN) capsule Take 1 capsule by mouth daily.    [provider]  nitroGLYCERIN (NITROSTAT) 0.4 MG SL tablet Place 0.4 mg under the tongue every 5 (five) minutes as needed for chest pain. 01/27/21 10/05/22  [provider]  pantoprazole (PROTONIX) 40 MG tablet Take 1 tablet (40 mg total) by mouth daily. 11/03/21 11/03/22  Georgie Chard D, NP  ramipril (ALTACE) 2.5 MG capsule Take  1 capsule (2.5 mg total) by mouth daily. 04/28/21   Lurene Shadow, MD  TRULICITY 0.75 MG/0.5ML SOPN Inject 0.75 mg into the skin once a week. Wednesday 02/19/20   Iloabachie, Chioma E, NP  XARELTO 20 MG TABS tablet Take 1 tablet (20 mg total) by mouth daily with supper. 09/09/21   Lanier Prude, MD    Physical Exam: Vitals:   05/26/22 0945 05/26/22 0946 05/26/22 1000 05/26/22 1100  BP: 138/82 138/82 137/76 (!) 146/89  Pulse: 100 94 95 98  Resp: 12 18 15 18   Temp:      TempSrc:      SpO2: 100% 100% 100% 100%  Weight:      Height:       Physical Exam Vitals and nursing note reviewed.  Constitutional:      Appearance: He is well-developed.  HENT:     Head: Normocephalic and atraumatic.  Eyes:     Pupils: Pupils are equal, round, and reactive to light.  Cardiovascular:     Rate and Rhythm: Normal rate and regular rhythm.     Heart sounds: Normal heart sounds.  Pulmonary:     Effort: Pulmonary effort is normal.      Breath sounds: Examination of the right-lower field reveals rales. Examination of the left-lower field reveals rales. Rales present.  Abdominal:     General: Bowel sounds are normal.     Palpations: Abdomen is soft.  Musculoskeletal:        General: Normal range of motion.     Cervical back: Normal range of motion and neck supple.     Comments: No pedal edema  Skin:    General: Skin is warm and dry.  Neurological:     General: No focal deficit present.     Mental Status: He is alert.  Psychiatric:        Mood and Affect: Mood normal.        Behavior: Behavior normal.     Data Reviewed: Relevant notes from primary care and specialist visits, past discharge summaries as available in EHR, including Care Everywhere. Prior diagnostic testing as pertinent to current admission diagnoses Updated medications and problem lists for reconciliation ED course, including vitals, labs, imaging, treatment and response to treatment Triage notes, nursing and pharmacy notes and ED provider's notes Notable results as noted in HPI Labs reviewed.  PT 15.9, INR 1.3, magnesium 1.7, sodium 139, potassium 4.1, chloride 102, bicarb 26, glucose 250, BUN 20, creatinine 1.26, calcium 9.1, total protein 8.1, albumin 3.5, AST 23, ALT 12, alkaline phosphatase 105, total bilirubin 0.8, lipase 50, white count 7.5, hemoglobin 11.7, hematocrit 37.1, platelet count 155 Respiratory viral panel is negative There are no new results to review at this time.  Assessment and Plan: * NSTEMI (non-ST elevated myocardial infarction) [facility]) Patient with a long history of coronary artery disease status post CABG who presents for evaluation of left arm pain and chest pain and has ruled in for non-ST elevation MI Continue heparin drip initiated in the ER Continue aspirin, Plavix, nitrates, beta-blocker and high intensity statins Obtain 2D echocardiogram to assess LVEF and rule out regional wall motion abnormality Consult  cardiology  Acute on chronic combined systolic and diastolic CHF (congestive heart failure) (HCC) Patient with complaints of shortness of breath and a nonproductive cough He was hypoxic by EMS with room air pulse oximetry of 85% Last known LVEF of 40 to 45% from a 2D echocardiogram which was done 03/23 X-ray shows findings concerning for  acute CHF We will obtain BNP levels Start patient on Lasix 40 mg IV daily Continue metoprolol and ramipril Oxygen supplementation to maintain pulse oximetry greater than 92% Follow-up results of 2D echocardiogram to assess LVEF and rule out regional wall motion abnormality  Hx of pulmonary embolus Hold Xarelto while patient is on heparin drip  History of seizure Continue Depakote Place patient on seizure precautions  Hypomagnesemia Supplement magnesium  Essential hypertension Continue metoprolol and ramipril  Uncontrolled type 2 diabetes mellitus with hyperglycemia, without long-term current use of insulin (HCC) Patient with hyperglycemia most likely related to acute non-ST elevation MI Hold oral hypoglycemic agents Glycemic control with sliding scale insulin Maintain current system carbohydrate diet      Advance Care Planning:   Code Status: Full Code   Consults: Cardiology  Family Communication: Greater than 50% of time was spent discussing patient's condition and plan of care with patient at the bedside.  All questions and concerns have been addressed.  He verbalizes understanding and agrees to the plan.  CODE STATUS was discussed and patient is a full code.  Severity of Illness: The appropriate patient status for this patient is INPATIENT. Inpatient status is judged to be reasonable and necessary in order to provide the required intensity of service to ensure the patient's safety. The patient's presenting symptoms, physical exam findings, and initial radiographic and laboratory data in the context of their chronic comorbidities is felt  to place them at high risk for further clinical deterioration. Furthermore, it is not anticipated that the patient will be medically stable for discharge from the hospital within 2 midnights of admission.   * I certify that at the point of admission it is my clinical judgment that the patient will require inpatient hospital care spanning beyond 2 midnights from the point of admission due to high intensity of service, high risk for further deterioration and high frequency of surveillance required.*  Author: Lucile Shutters, MD 05/26/2022 12:28 PM  For on call review [email or web address].

## 2022-05-26 NOTE — Assessment & Plan Note (Signed)
Patient with a long history of coronary artery disease status post CABG who presents for evaluation of left arm pain and chest pain and has ruled in for non-ST elevation MI Continue heparin drip initiated in the ER Continue aspirin, Plavix, nitrates, beta-blocker and high intensity statins Obtain 2D echocardiogram to assess LVEF and rule out regional wall motion abnormality Consult cardiology

## 2022-05-26 NOTE — ED Provider Notes (Signed)
[facility] Provider Note    Event Date/Time   First MD Initiated Contact with Patient 05/26/22 [phone]     (approximate)   History   Chest Pain   HPI  Thomas Moore is a 66 y.o. male   Past medical history of CAD with history of bypass, CVA, history of TAVR, hyperlipidemia, hypertension, diabetes, COPD not on oxygen presents with transient left arm pain.  Happened at rest around 4 AM around the left elbow atraumatic got better with nitroglycerin.  Came back later the morning and has spontaneously improved without further intervention.  He has been dealing with cold for the past week with a mild cough has been taking cough suppressant no fever shortness of breath nausea or vomiting.  No abdominal pain.  Denies any other acute medical complaints.  Has not taken his morning meds prior to arrival.  History was obtained via patient  ---- Reviewed cardiology note 05/10/2022: Most recently, patient was seen in the ED on 02/03/2022 for evaluation of left arm pain. He states that he had an episode of left arm pain for which he had taken several nitroglycerin tablets with no change in his left arm pain. Troponins were slightly elevated although flat on evaluation at the ED, otherwise nonischemic work-up. He had resolution of his left arm pain while in the ED and no recurrence. He has had some left arm pain off and on since that time, although denies any chest discomfort or shortness of breath. He states that his shortness of breath is significantly improved after he had TAVR in January of this year. He underwent Myoview on 03/16/22 which showed no evidence of myocardial ischemia. Recent echo on 08/10/2021 showed normally functioning, well-seated aortic valve prosthesis.   He continues to have left arm pain periodically, not associated with exercise or relieved by rest, with no associated other symptoms. He takes nitroglycerin frequently for this pain with no changes in his  symptoms. We have had extensive conversation about his extensive recent cardiac work-up which has not shown any new or worsening CAD, no aortic valve changes, no other cardiac reason for his left arm pain. Discussed that his left arm pain is unlikely to be cardiac in origin and recommended follow-up with PCP for further evaluation and management options.  ---      Physical Exam   Triage Vital Signs: ED Triage Vitals  Enc Vitals Group     BP 05/26/22 0658 (!) 171/100     Pulse Rate 05/26/22 0653 (!) 118     Resp 05/26/22 0653 20     Temp 05/26/22 0658 97.8 F (36.6 C)     Temp src --      SpO2 05/26/22 0653 (!) 88 %     Weight 05/26/22 0654 150 lb (68 kg)     Height 05/26/22 0654 5\' 3"  (1.6 m)     Head Circumference --      Peak Flow --      Pain Score 05/26/22 0653 2     Pain Loc --      Pain Edu? --      Excl. in GC? --     Most recent vital signs: Vitals:   05/26/22 1000 05/26/22 1100  BP: 137/76 (!) 146/89  Pulse: 95 98  Resp: 15 18  Temp:    SpO2: 100% 100%    General: Awake, no distress.  CV:  Good peripheral perfusion.  Resp:  Normal effort.  Abd:  No  distention.  Other:  He is tachycardic in the low 100s and hypertensive, oxygen saturation is 98% on room air, speaking in full sentences appears comfortable nontoxic pleasant though hard of hearing.  Lungs with bilateral rales to the bases..  He appears euvolemic.  Bilateral radial pulses intact.  There is no tenderness to the elbow on the left side he has full active range of motion.   ED Results / Procedures / Treatments   Labs (all labs ordered are listed, but only abnormal results are displayed) Labs Reviewed  CBC WITH DIFFERENTIAL/PLATELET - Abnormal; Notable for the following components:      Result Value   Hemoglobin 11.7 (*)    HCT 37.1 (*)    All other components within normal limits  COMPREHENSIVE METABOLIC PANEL - Abnormal; Notable for the following components:   Glucose, Bld 250 (*)     Creatinine, Ser 1.26 (*)    All other components within normal limits  TROPONIN I (HIGH SENSITIVITY) - Abnormal; Notable for the following components:   Troponin I (High Sensitivity) 66 (*)    All other components within normal limits  TROPONIN I (HIGH SENSITIVITY) - Abnormal; Notable for the following components:   Troponin I (High Sensitivity) 1,953 (*)    All other components within normal limits  RESP PANEL BY RT-PCR (RSV, FLU A&B, COVID)  RVPGX2  MAGNESIUM  LIPASE, BLOOD  CBC WITH DIFFERENTIAL/PLATELET  APTT  PROTIME-INR     I reviewed labs and they are notable for troponin increased from 60s to nearly 2000.  EKG  ED ECG REPORT I, Pilar Jarvis, the attending physician, personally viewed and interpreted this ECG.   Date: 05/26/2022  EKG Time: 0732  Rate: 110  Rhythm: sinus tachycardia  Axis: nl  Intervals:none  ST&T Change: inferolateral std    RADIOLOGY I independently reviewed and interpreted chest x-ray and see some diffuse opacities at both bases concerning for pulmonary edema   PROCEDURES:  Critical Care performed: Yes, see critical care procedure note(s)  .Critical Care  Performed by: Pilar Jarvis, MD Authorized by: Pilar Jarvis, MD   Critical care provider statement:    Critical care time (minutes):  30   Critical care was necessary to treat or prevent imminent or life-threatening deterioration of the following conditions: NSTEMI on heparin drip.   Critical care was time spent personally by me on the following activities:  Development of treatment plan with patient or surrogate, discussions with consultants, evaluation of patient's response to treatment, examination of patient, ordering and review of laboratory studies, ordering and review of radiographic studies, ordering and performing treatments and interventions, pulse oximetry, re-evaluation of patient's condition and review of old charts    MEDICATIONS ORDERED IN ED: Medications  metoprolol  succinate (TOPROL-XL) 24 hr tablet 100 mg (100 mg Oral Given 05/26/22 0739)  aspirin chewable tablet 324 mg (324 mg Oral Given 05/26/22 0739)  furosemide (LASIX) injection 40 mg (40 mg Intravenous Given 05/26/22 0834)    Consultants:  I spoke with hospitalist for admission & regarding care plan for this patient.   IMPRESSION / MDM / ASSESSMENT AND PLAN / ED COURSE  I reviewed the triage vital signs and the nursing notes.                              Differential diagnosis includes, but is not limited to, ACS, CHF exacerbation, COPD, PE, dissection, resp infection   The patient is on the  cardiac monitor to evaluate for evidence of arrhythmia and/or significant heart rate changes.  MDM: This is a patient with cardiac risk factors and CAD history presenting with very atypical left arm pain that he has been evaluated extensively in the past that was most recently deemed noncardiac left arm pain and his cardiology note from earlier this month.    He appears comfortable and pain has mostly subsided without intervention however his EKG does show some inferolateral ST depressions that appear new from prior.  Will follow-up with repeat troponins and repeat EKGs continue to monitor symptoms.  I have given him aspirin while working up.   He is hypertensive and slightly tachycardic in the emergency department but has not taken his home meds.  Will give his metoprolol.    He has some rales to auscultation as well as pulmonary edema on chest x-ray though he does not complain of shortness of breath and his oxygen saturation is normal on room air, will diurese with IV Lasix in place of his home p.o.  The symptoms are inconsistent for PE (also on xarelto w refill noted on medical record earlier this week) or dissection and I have very low clinical suspicion of these diagnoses.   11:04 AM Trop  increased from 60 to over 1000. Curiously, the patient is chest pain and arm pain-free at this time and  hemodynamics have improved after given his home metoprolol.  He has diuresed well. Will admit for NSTEMI, started on heparin drip.  Patient's presentation is most consistent with acute presentation with potential threat to life or bodily function.       FINAL CLINICAL IMPRESSION(S) / ED DIAGNOSES   Final diagnoses:  Atypical chest pain  NSTEMI (non-ST elevated myocardial infarction) (HCC)     Rx / DC Orders   ED Discharge Orders     None        Note:  This document was prepared using Dragon voice recognition software and may include unintentional dictation errors.    Pilar Jarvis, MD 05/26/22 [id number]

## 2022-05-26 NOTE — Assessment & Plan Note (Signed)
Patient with hyperglycemia most likely related to acute non-ST elevation MI Hold oral hypoglycemic agents Glycemic control with sliding scale insulin Maintain current system carbohydrate diet

## 2022-05-26 NOTE — ED Notes (Signed)
Pt in bed, resps even and unlabored, pt denies pain, meal tray given, pt complains that there are green vegetables on his plate, explained that pt was on a heart healthy diet and vegetables were part of this diet plan.

## 2022-05-26 NOTE — ED Notes (Signed)
Pt placed on 2L O2 via [place] per md order

## 2022-05-27 ENCOUNTER — Encounter: Payer: Self-pay | Admitting: Internal Medicine

## 2022-05-27 ENCOUNTER — Inpatient Hospital Stay
Admit: 2022-05-27 | Discharge: 2022-05-27 | Disposition: A | Discharge status: Home / Self Care | Payer: Medicare Other | Attending: Internal Medicine | Admitting: Internal Medicine

## 2022-05-27 DIAGNOSIS — I214 Non-ST elevation (NSTEMI) myocardial infarction: Secondary | ICD-10-CM | POA: Diagnosis not present

## 2022-05-27 LAB — ECHOCARDIOGRAM COMPLETE
AV Mean grad: 4 mmHg
AV Peak grad: 8.2 mmHg
Ao pk vel: 1.43 m/s
Area-P 1/2: 3.79 cm2
Calc EF: 45.3 %
Height: 63 in
S' Lateral: 3.6 cm
Single Plane A2C EF: 44.5 %
Single Plane A4C EF: 45.9 %
Weight: 2400 oz

## 2022-05-27 LAB — CBC
HCT: 37.3 % — ABNORMAL LOW (ref 39.0–52.0)
Hemoglobin: 11.7 g/dL — ABNORMAL LOW (ref 13.0–17.0)
MCH: 27.7 pg (ref 26.0–34.0)
MCHC: 31.4 g/dL (ref 30.0–36.0)
MCV: 88.2 fL (ref 80.0–100.0)
Platelets: 142 10*3/uL — ABNORMAL LOW (ref 150–400)
RBC: 4.23 MIL/uL (ref 4.22–5.81)
RDW: 15.2 % (ref 11.5–15.5)
WBC: 6.1 10*3/uL (ref 4.0–10.5)
nRBC: 0 % (ref 0.0–0.2)

## 2022-05-27 LAB — LIPID PANEL
Cholesterol: 141 mg/dL (ref 0–200)
HDL: 32 mg/dL — ABNORMAL LOW (ref >40)
LDL Cholesterol: 77 mg/dL (ref 0–99)
Total CHOL/HDL Ratio: 4.4 RATIO
Triglycerides: 159 mg/dL — ABNORMAL HIGH (ref <150)
VLDL: 32 mg/dL (ref 0–40)

## 2022-05-27 LAB — BASIC METABOLIC PANEL
Anion gap: 12 (ref 5–15)
BUN: 24 mg/dL — ABNORMAL HIGH (ref 8–23)
CO2: 24 mmol/L (ref 22–32)
Calcium: 9.2 mg/dL (ref 8.9–10.3)
Chloride: 102 mmol/L (ref 98–111)
Creatinine, Ser: 1.23 mg/dL (ref 0.61–1.24)
GFR, Estimated: >60 mL/min (ref >60)
Glucose, Bld: 182 mg/dL — ABNORMAL HIGH (ref 70–99)
Potassium: 4.1 mmol/L (ref 3.5–5.1)
Sodium: 138 mmol/L (ref 135–145)

## 2022-05-27 LAB — APTT
aPTT: 80 seconds — ABNORMAL HIGH (ref 24–36)
aPTT: 85 seconds — ABNORMAL HIGH (ref 24–36)

## 2022-05-27 LAB — CBG MONITORING, ED
Glucose-Capillary: 190 mg/dL — ABNORMAL HIGH (ref 70–99)
Glucose-Capillary: 204 mg/dL — ABNORMAL HIGH (ref 70–99)
Glucose-Capillary: 131 mg/dL — ABNORMAL HIGH (ref 70–99)

## 2022-05-27 LAB — HEPARIN LEVEL (UNFRACTIONATED): Heparin Unfractionated: 0.58 IU/mL (ref 0.30–0.70)

## 2022-05-27 LAB — HEMOGLOBIN A1C
Hgb A1c MFr Bld: 8.7 % — ABNORMAL HIGH (ref 4.8–5.6)
Mean Plasma Glucose: 203 mg/dL

## 2022-05-27 MED ORDER — TICAGRELOR 90 MG PO TABS
90.0000 mg | ORAL_TABLET | Freq: Two times a day (BID) | ORAL | Status: DC
  Administered 2022-05-27 – 2022-05-28 (×3): 90 mg via ORAL
  Filled 2022-05-27 (×4): qty 1

## 2022-05-27 MED ORDER — ATORVASTATIN CALCIUM 80 MG PO TABS
80.0000 mg | ORAL_TABLET | Freq: Every day | ORAL | Status: DC
  Administered 2022-05-27: 80 mg via ORAL
  Filled 2022-05-27: qty 1

## 2022-05-27 NOTE — Consult Note (Signed)
ANTICOAGULATION CONSULT NOTE   Pharmacy Consult for Heparin Infusion Indication: chest pain/ACS  No Known Allergies  Patient Measurements: Height: 5\' 3"  (160 cm) Weight: 68 kg (150 lb) IBW/kg (Calculated) : 56.9 Heparin Dosing Weight: 68 kg  Vital Signs: Temp: 98.1 F (36.7 C) (12/23 0503) Temp Source: Oral (12/23 0503) BP: 128/67 (12/23 0503) Pulse Rate: 76 (12/23 0503)  Labs: Recent Labs     0000 05/26/22 0735 05/26/22 0947 05/26/22 1058 05/26/22 1100 05/26/22 1334 05/26/22 1436 05/26/22 1713 05/26/22 1717 05/26/22 2013 05/27/22 0152 05/27/22 0520 05/27/22 0802  HGB  --  11.7*  --   --   --   --   --   --   --   --   --  11.7*  --   HCT  --  37.1*  --   --   --   --   --   --   --   --   --  37.3*  --   PLT  --  155  --   --   --   --   --   --   --   --   --  142*  --   APTT   < >  --   --   --  36  --   --   --  53*  --  80*  --  85*  LABPROT  --   --   --   --  15.9*  --   --   --   --   --   --   --   --   INR  --   --   --   --  1.3*  --   --   --   --   --   --   --   --   HEPARINUNFRC  --   --   --  >1.10*  --   --   --   --   --   --   --   --  0.58  CREATININE  --  1.26*  --   --   --   --  1.30*  --   --   --   --  1.23  --   TROPONINIHS  --  66*   < >  --   --    < > 13,823* 16,723*  --  15,439*  --   --   --    < > = values in this interval not displayed.     Estimated Creatinine Clearance: 47.5 mL/min (by C-G formula based on SCr of 1.23 mg/dL).   Medical History: Past Medical History:  Diagnosis Date   CAD (coronary artery disease) of artery bypass graft    s/p CABG x 4 in 1997   COPD (chronic obstructive pulmonary disease) (HCC)    Coronary artery disease    CVA (cerebral vascular accident) (HCC)    Diabetes mellitus without complication (HCC)    Dysrhythmia    GERD (gastroesophageal reflux disease)    HLD (hyperlipidemia)    Hypertension    S/P TAVR (transcatheter aortic valve replacement) 07/05/2021   with Edwards 14mm S3UR via TF  approach with Dr. 38m and Dr. Excell Seltzer   Seizures [facility])     Medications:  Scheduled:   aspirin EC  81 mg Oral Daily   atorvastatin  40 mg Oral QHS   calcium-vitamin D  1 tablet Oral BID WC  clopidogrel  75 mg Oral Daily   divalproex  250 mg Oral QHS   furosemide  40 mg Intravenous Daily   insulin aspart  0-15 Units Subcutaneous TID WC   isosorbide mononitrate  30 mg Oral Daily   metoprolol succinate  100 mg Oral Daily   multivitamin with minerals  1 tablet Oral Daily   pantoprazole  40 mg Oral Daily   ramipril  2.5 mg Oral Daily   Infusions:   heparin 1,000 Units/hr (05/26/22 2011)   PRN: acetaminophen, cyclobenzaprine, nitroGLYCERIN, ondansetron (ZOFRAN) IV  Assessment: Thomas Moore is a 66 y.o. male presenting with NSTEMI. PMH significant for CAD (CABG, CVA, TAVR), HLD, HTN, T2DM, COPD. Patient was on [facility] PTA per chart review (Xarelto for history of PE). Last dose Xarelto 20mg  PTA was 12/21 at 1800 per patient recollection. Pharmacy has been consulted to initiate and manage heparin infusion.   Baseline Labs: aPTT 36, HL >1.10, PT 15.9, INR 1.3, Hgb 11.7, Hct 37.1, Plt 155  Goal of Therapy:  Heparin level 0.3-0.7 units/ml aPTT 66-102 seconds Monitor platelets by anticoagulation protocol: Yes   Date Time aPTT/HL Rate/Comment  12/22 1717 53/---  800/SUBtherapeutic 12/23 0152 80/---  1000/therapeutic x1 12/23 0802 85/0.58 1000/therapeutic x2, HL appears to correlate  Plan:  Continue heparin infusion at 1000 units/hr Check aPTT & HL tomorrow morning to confirm correlation  Switch to HL monitoring once HL and aPTT correlate.  Continue to monitor H&H and platelets daily while on heparin infusion   12-07-1982, PharmD PGY1 Pharmacy Resident 05/27/2022 8:39 AM

## 2022-05-27 NOTE — Progress Notes (Signed)
PROGRESS NOTE    Thomas Moore  GTX:[id number] DOB: 1955/07/02 DOA: 05/26/2022 PCP: Lauro Regulus, MD   Brief Narrative:  This 66 years old male with PMH significant for CAD s/p CABG, history of aortic stenosis s/p TAVR, history of seizure disorder, PE on Xarelto presented to the ED for the complaints of intermittent left arm pain which got worse.  He reports having this pain for a while, had recent stress test which was negative.  He also reports having chest pain over the left anterior chest wall but he stated it does not bother him as much is left arm pain.  He has taken nitroglycerin at home with some improvement.  He also reports shortness of breath associated with nonproductive cough.  EKG shows sinus tachycardia with diffuse ST depression, found to have elevated troponin.  Patient is started on heparin and admitted for further evaluation cardiology is consulted.  Assessment & Plan:   Principal Problem:   NSTEMI (non-ST elevated myocardial infarction) (HCC) Active Problems:   Acute on chronic combined systolic and diastolic CHF (congestive heart failure) (HCC)   Uncontrolled type 2 diabetes mellitus with hyperglycemia, without long-term current use of insulin (HCC)   Essential hypertension   Hypomagnesemia   History of seizure   Hx of pulmonary embolus  NSTEMI: Patient with significant history of CAD s/p CABG presented for the evaluation of left arm pain,  chest pain and found to have elevated troponin consistent with NSTEMI. Continue IV heparin for 48 hours. Continue aspirin, Plavix, nitrates, beta-blocker and high intensity statins Obtain 2D echocardiogram to assess LVEF and rule out regional wall motion abnormality Cardiology is consulted, unfortunately will not do cardiac catheterization because Xarelto has to be held for 48 hours.   Acute on chronic combined systolic and diastolic CHF: Acute hypoxic respiratory failure: Patient reports shortness of breath and  nonproductive cough. He was hypoxic on arrival with SpO2 85% on room air. Last known LVEF of 40 to 45% from a 2D echocardiogram which was done 08/25/21 Chest x-ray shows finding concerning for acute CHF. BNP level 830. Continue Lasix 40 mg IV daily Continue metoprolol and ramipril Continue supplemental oxygen and maintain saturation above 92%. Follow-up echocardiogram.   History of pulmonary embolism: Hold Xarelto while patient is on heparin drip.   History of seizure disorder: Continue Depakote. Seizure precautions   Hypomagnesemia Replaced.  Continue to monitor   Essential hypertension: Continue metoprolol and ramipril   Type 2 diabetes with hyperglycemia: Patient has hyperglycemia likely related to stress in the setting of NSTEMI Hold oral hypoglycemic agents Continue regular insulin sliding scale Maintain current system carbohydrate diet   DVT prophylaxis: Heparin IV Code Status: Full code Family Communication: No family at bed side. Disposition Plan:  Status is: Inpatient Remains inpatient appropriate because: Admitted for left arm pain consistent with NSTEMI,  started on IV heparin. Cardiology is consulted.  Consultants:  Cardiology  Procedures:  Echo Antimicrobials:  None  Subjective: Patient was seen and examined at bedside.  Overnight events noted. Patient reports chest pain has resolved. He still reports having mild intermittent left arm pain which is also improved.  Objective: Vitals:   05/27/22 0145 05/27/22 0503 05/27/22 0900 05/27/22 1000  BP: (!) 117/35 128/67 (!) 148/80 114/77  Pulse: 79 76 92 79  Resp: 18 18 (!) 21 19  Temp:  98.1 F (36.7 C)    TempSrc:  Oral    SpO2: 100% 100% 100% 93%  Weight:      Height:  Intake/Output Summary (Last 24 hours) at 05/27/2022 1244 Last data filed at 05/27/2022 0537 Gross per 24 hour  Intake 46.21 ml  Output 400 ml  Net -353.79 ml   Filed Weights   05/26/22 0654  Weight: 68 kg     Examination:  General exam: Appears comfortable, not in any acute distress.  Deconditioned Respiratory system: CTA bilaterally, respiratory for normal, RR 15 Cardiovascular system: S1 & S2 heard, regular rate and rhythm, no murmur. Gastrointestinal system: Abdomen is soft, non tender, non distended, BS+ Central nervous system: Alert and oriented x 3. No focal neurological deficits. Extremities: No edema, no cyanosis, no clubbing Skin: No rashes, lesions or ulcers Psychiatry: Judgement and insight appear normal. Mood & affect appropriate.     Data Reviewed: I have personally reviewed following labs and imaging studies  CBC: Recent Labs  Lab 05/26/22 0735 05/27/22 0520  WBC 7.5 6.1  NEUTROABS 6.2  --   HGB 11.7* 11.7*  HCT 37.1* 37.3*  MCV 87.5 88.2  PLT 155 142*   Basic Metabolic Panel: Recent Labs  Lab 05/26/22 0735 05/26/22 1436 05/27/22 0520  NA 139 138 138  K 4.1 3.9 4.1  CL 102 99 102  CO2 26 30 24   GLUCOSE 250* 209* 182*  BUN 20 19 24*  CREATININE 1.26* 1.30* 1.23  CALCIUM 9.1 9.0 9.2  MG 1.7 2.4  --    GFR: Estimated Creatinine Clearance: 47.5 mL/min (by C-G formula based on SCr of 1.23 mg/dL). Liver Function Tests: Recent Labs  Lab 05/26/22 0735  AST 23  ALT 12  ALKPHOS 105  BILITOT 0.8  PROT 8.1  ALBUMIN 3.5   Recent Labs  Lab 05/26/22 0735  LIPASE 50   No results for input(s): "AMMONIA" in the last 168 hours. Coagulation Profile: Recent Labs  Lab 05/26/22 1100  INR 1.3*   Cardiac Enzymes: No results for input(s): "CKTOTAL", "CKMB", "CKMBINDEX", "TROPONINI" in the last 168 hours. BNP (last 3 results) No results for input(s): "PROBNP" in the last 8760 hours. HbA1C: Recent Labs    05/26/22 1222  HGBA1C 8.7*   CBG: Recent Labs  Lab 05/26/22 1604 05/26/22 2224 05/27/22 0805 05/27/22 1148  GLUCAP 240* 192* 190* 204*   Lipid Profile: Recent Labs    05/27/22 0520  CHOL 141  HDL 32*  LDLCALC 77  TRIG 05/29/22*  CHOLHDL  4.4   Thyroid Function Tests: No results for input(s): "TSH", "T4TOTAL", "FREET4", "T3FREE", "THYROIDAB" in the last 72 hours. Anemia Panel: No results for input(s): "VITAMINB12", "FOLATE", "FERRITIN", "TIBC", "IRON", "RETICCTPCT" in the last 72 hours. Sepsis Labs: No results for input(s): "PROCALCITON", "LATICACIDVEN" in the last 168 hours.  Recent Results (from the past 240 hour(s))  Resp panel by RT-PCR (RSV, Flu A&B, Covid) Anterior Nasal Swab     Status: None   Collection Time: 05/26/22  7:34 AM   Specimen: Anterior Nasal Swab  Result Value Ref Range Status   SARS Coronavirus 2 by RT PCR NEGATIVE NEGATIVE Final    Comment: (NOTE) SARS-CoV-2 target nucleic acids are NOT DETECTED.  The SARS-CoV-2 RNA is generally detectable in upper respiratory specimens during the acute phase of infection. The lowest concentration of SARS-CoV-2 viral copies this assay can detect is 138 copies/mL. A negative result does not preclude SARS-Cov-2 infection and should not be used as the sole basis for treatment or other patient management decisions. A negative result may occur with  improper specimen collection/handling, submission of specimen other than nasopharyngeal swab, presence of viral mutation(s)  within the areas targeted by this assay, and inadequate number of viral copies(<138 copies/mL). A negative result must be combined with clinical observations, patient history, and epidemiological information. The expected result is Negative.  Fact Sheet for Patients:  BloggerCourse.com  Fact Sheet for Healthcare Providers:  SeriousBroker.it  This test is no t yet approved or cleared by the [place] FDA and  has been authorized for detection and/or diagnosis of SARS-CoV-2 by FDA under an Emergency Use Authorization (EUA). This EUA will remain  in effect (meaning this test can be used) for the duration of the COVID-19 declaration under  Section 564(b)(1) of the Act, 21 U.S.C.section 360bbb-3(b)(1), unless the authorization is terminated  or revoked sooner.       Influenza A by PCR NEGATIVE NEGATIVE Final   Influenza B by PCR NEGATIVE NEGATIVE Final    Comment: (NOTE) The Xpert Xpress SARS-CoV-2/FLU/RSV plus assay is intended as an aid in the diagnosis of influenza from Nasopharyngeal swab specimens and should not be used as a sole basis for treatment. Nasal washings and aspirates are unacceptable for Xpert Xpress SARS-CoV-2/FLU/RSV testing.  Fact Sheet for Patients: BloggerCourse.com  Fact Sheet for Healthcare Providers: SeriousBroker.it  This test is not yet approved or cleared by the [place] FDA and has been authorized for detection and/or diagnosis of SARS-CoV-2 by FDA under an Emergency Use Authorization (EUA). This EUA will remain in effect (meaning this test can be used) for the duration of the COVID-19 declaration under Section 564(b)(1) of the Act, 21 U.S.C. section 360bbb-3(b)(1), unless the authorization is terminated or revoked.     Resp Syncytial Virus by PCR NEGATIVE NEGATIVE Final    Comment: (NOTE) Fact Sheet for Patients: BloggerCourse.com  Fact Sheet for Healthcare Providers: SeriousBroker.it  This test is not yet approved or cleared by the [place] FDA and has been authorized for detection and/or diagnosis of SARS-CoV-2 by FDA under an Emergency Use Authorization (EUA). This EUA will remain in effect (meaning this test can be used) for the duration of the COVID-19 declaration under Section 564(b)(1) of the Act, 21 U.S.C. section 360bbb-3(b)(1), unless the authorization is terminated or revoked.  Performed at [facility], [address], [place], [place] [address]     Radiology Studies: [facility] Chest [place] 1 View  Result Date: 05/26/2022 CLINICAL DATA:  Chest pain  radiating to left arm. Hypoxia. Coronary artery disease. EXAM: PORTABLE CHEST 1 VIEW COMPARISON:  02/02/2022 FINDINGS: Prior CABG and aortic valve replacement noted. Cardiac loop recorder again seen. Stable mild cardiomegaly. Increased interstitial infiltrates seen in the lung bases, suspicious for interstitial edema. Pulmonary hyperinflation again seen, consistent with COPD. IMPRESSION: Increased bilateral lower lung interstitial infiltrates, suspicious for interstitial edema/congestive heart failure. COPD. Electronically Signed   By: Danae Orleans M.D.   On: 05/26/2022 07:26    Scheduled Meds:  aspirin EC  81 mg Oral Daily   atorvastatin  40 mg Oral QHS   calcium-vitamin D  1 tablet Oral BID WC   clopidogrel  75 mg Oral Daily   divalproex  250 mg Oral QHS   furosemide  40 mg Intravenous Daily   insulin aspart  0-15 Units Subcutaneous TID WC   isosorbide mononitrate  30 mg Oral Daily   metoprolol succinate  100 mg Oral Daily   multivitamin with minerals  1 tablet Oral Daily   pantoprazole  40 mg Oral Daily   ramipril  2.5 mg Oral Daily   Continuous Infusions:  heparin 1,000 Units/hr (05/26/22 2011)  LOS: 1 day    Time spent: 50 mins    Shawna Clamp, MD [facility]   If 7PM-7AM, please contact night-coverage

## 2022-05-27 NOTE — Consult Note (Signed)
ANTICOAGULATION CONSULT NOTE - Initial Consult  Pharmacy Consult for heparin infusion Indication: chest pain/ACS  No Known Allergies  Patient Measurements: Height: 5\' 3"  (160 cm) Weight: 68 kg (150 lb) IBW/kg (Calculated) : 56.9 Heparin Dosing Weight: 68 kg  Vital Signs: Temp: 97.8 F (36.6 C) (12/22 1646) Temp Source: Oral (12/22 1646) BP: 117/35 (12/23 0145) Pulse Rate: 79 (12/23 0145)  Labs: Recent Labs    05/26/22 0735 05/26/22 0947 05/26/22 1058 05/26/22 1100 05/26/22 1334 05/26/22 1436 05/26/22 1713 05/26/22 1717 05/26/22 2013 05/27/22 0152  HGB 11.7*  --   --   --   --   --   --   --   --   --   HCT 37.1*  --   --   --   --   --   --   --   --   --   PLT 155  --   --   --   --   --   --   --   --   --   APTT  --   --   --  36  --   --   --  53*  --  80*  LABPROT  --   --   --  15.9*  --   --   --   --   --   --   INR  --   --   --  1.3*  --   --   --   --   --   --   HEPARINUNFRC  --   --  >1.10*  --   --   --   --   --   --   --   CREATININE 1.26*  --   --   --   --  1.30*  --   --   --   --   TROPONINIHS 66*   < >  --   --    < > 13,823* 16,723*  --  15,439*  --    < > = values in this interval not displayed.     Estimated Creatinine Clearance: 45 mL/min (A) (by C-G formula based on SCr of 1.3 mg/dL (H)).   Medical History: Past Medical History:  Diagnosis Date   CAD (coronary artery disease) of artery bypass graft    s/p CABG x 4 in 1997   COPD (chronic obstructive pulmonary disease) (HCC)    Coronary artery disease    CVA (cerebral vascular accident) (HCC)    Diabetes mellitus without complication (HCC)    Dysrhythmia    GERD (gastroesophageal reflux disease)    HLD (hyperlipidemia)    Hypertension    S/P TAVR (transcatheter aortic valve replacement) 07/05/2021   with Edwards 39mm S3UR via TF approach with Dr. 38m and Dr. Excell Seltzer   Seizures [facility])     Medications:  Patient on Xarelto 20mg  daily prior to admission. Last dose 12/21 at 1800  (per patient recollection)  Assessment: Patient admitted to ED with arm pain and elevated troponin. PMH includes  CAD with history of bypass, CVA, history of TAVR, hyperlipidemia, hypertension, diabetes, COPD. Pharmacy consulted to manage heparin infusion for ACS.  12/22:  aPTT @ 1717 = 53, SUBtherapeutic 12/23:  aPTT @ 0152 = 80, Therapeutic X 1   Goal of Therapy:  Heparin level 0.3-0.7 units/ml aPTT 66-102 seconds Monitor platelets by anticoagulation protocol: Yes   Plan:  12/23: aPTT @ 0152 = 80, therapeutic X  1 - Will continue pt on current rate and recheck aPTT and HL on 12/23 @ 0800 Continue to monitor H&H and platelets  Shara Hartis D, PharmD Clinical Pharmacist 05/27/2022 3:02 AM

## 2022-05-27 NOTE — Progress Notes (Signed)
SUBJECTIVE: Patient no longer has left arm pain and never had chest pain and feeling much better   Vitals:   05/27/22 0145 05/27/22 0503 05/27/22 0900 05/27/22 1000  BP: (!) 117/35 128/67 (!) 148/80 114/77  Pulse: 79 76 92 79  Resp: 18 18 (!) 21 19  Temp:  98.1 F (36.7 C)    TempSrc:  Oral    SpO2: 100% 100% 100% 93%  Weight:      Height:        Intake/Output Summary (Last 24 hours) at 05/27/2022 1312 Last data filed at 05/27/2022 0537 Gross per 24 hour  Intake 46.21 ml  Output 400 ml  Net -353.79 ml    LABS: Basic Metabolic Panel: Recent Labs    05/26/22 0735 05/26/22 1436 05/27/22 0520  NA 139 138 138  K 4.1 3.9 4.1  CL 102 99 102  CO2 26 30 24   GLUCOSE 250* 209* 182*  BUN 20 19 24*  CREATININE 1.26* 1.30* 1.23  CALCIUM 9.1 9.0 9.2  MG 1.7 2.4  --    Liver Function Tests: Recent Labs    05/26/22 0735  AST 23  ALT 12  ALKPHOS 105  BILITOT 0.8  PROT 8.1  ALBUMIN 3.5   Recent Labs    05/26/22 0735  LIPASE 50   CBC: Recent Labs    05/26/22 0735 05/27/22 0520  WBC 7.5 6.1  NEUTROABS 6.2  --   HGB 11.7* 11.7*  HCT 37.1* 37.3*  MCV 87.5 88.2  PLT 155 142*   Cardiac Enzymes: No results for input(s): "CKTOTAL", "CKMB", "CKMBINDEX", "TROPONINI" in the last 72 hours. BNP: Invalid input(s): "POCBNP" D-Dimer: No results for input(s): "DDIMER" in the last 72 hours. Hemoglobin A1C: Recent Labs    05/26/22 1222  HGBA1C 8.7*   Fasting Lipid Panel: Recent Labs    05/27/22 0520  CHOL 141  HDL 32*  LDLCALC 77  TRIG 05/29/22*  CHOLHDL 4.4   Thyroid Function Tests: No results for input(s): "TSH", "T4TOTAL", "T3FREE", "THYROIDAB" in the last 72 hours.  Invalid input(s): "FREET3" Anemia Panel: No results for input(s): "VITAMINB12", "FOLATE", "FERRITIN", "TIBC", "IRON", "RETICCTPCT" in the last 72 hours.   PHYSICAL EXAM General: Well developed, well nourished, in no acute distress HEENT:  Normocephalic and atramatic Neck:  No JVD.  Lungs:  Clear bilaterally to auscultation and percussion. Heart: HRRR . Normal S1 and S2 without gallops or murmurs.  Abdomen: Bowel sounds are positive, abdomen soft and non-tender  Msk:  Back normal, normal gait. Normal strength and tone for age. Extremities: No clubbing, cyanosis or edema.   Neuro: Alert and oriented X 3. Psych:  Good affect, responds appropriately  TELEMETRY: Atrial flutter/A-fib  ASSESSMENT AND PLAN: Non-STEMI with atrial flutter and extensive history of coronary artery disease with status post CABG and TAVR.  I reviewed left heart catheterization done in January 2023 and has extensive diffuse coronary artery disease.  Probably one of the grafts got occluded and has completed MRI by now.  Unfortunately could not do cardiac catheterization yesterday because of being on Xarelto.  Advise discontinuing heparin tomorrow and change back to Xarelto when planned discharge with follow-up in the office.  Patient is clinically stable and no further indication to do cardiac catheterization.  Will change Plavix to Brilinta 90 mg twice daily.  Principal Problem:   NSTEMI (non-ST elevated myocardial infarction) (HCC) Active Problems:   Uncontrolled type 2 diabetes mellitus with hyperglycemia, without long-term current use of insulin (HCC)   Essential hypertension  Hypomagnesemia   History of seizure   Hx of pulmonary embolus   Acute on chronic combined systolic and diastolic CHF (congestive heart failure) (HCC)    Thomas Moore A, MD, [facility] 05/27/2022 1:12 PM

## 2022-05-27 NOTE — Progress Notes (Signed)
  Echocardiogram 2D Echocardiogram has been performed.  Milda Smart 05/27/2022, 1:29 PM

## 2022-05-28 DIAGNOSIS — I214 Non-ST elevation (NSTEMI) myocardial infarction: Secondary | ICD-10-CM | POA: Diagnosis not present

## 2022-05-28 DIAGNOSIS — R0789 Other chest pain: Secondary | ICD-10-CM

## 2022-05-28 LAB — GLUCOSE, CAPILLARY: Glucose-Capillary: 171 mg/dL — ABNORMAL HIGH (ref 70–99)

## 2022-05-28 LAB — APTT: aPTT: 75 seconds — ABNORMAL HIGH (ref 24–36)

## 2022-05-28 LAB — HEPARIN LEVEL (UNFRACTIONATED): Heparin Unfractionated: 0.5 IU/mL (ref 0.30–0.70)

## 2022-05-28 MED ORDER — ATORVASTATIN CALCIUM 80 MG PO TABS: 80.0000 mg | ORAL_TABLET | Freq: Every day | ORAL | 1 refills | Status: DC

## 2022-05-28 MED ORDER — TICAGRELOR 90 MG PO TABS: 90.0000 mg | ORAL_TABLET | Freq: Two times a day (BID) | ORAL | 2 refills | Status: DC

## 2022-05-28 MED ORDER — RIVAROXABAN 15 MG PO TABS: 15.0000 mg | ORAL_TABLET | Freq: Two times a day (BID) | ORAL | 0 refills | Status: DC

## 2022-05-28 NOTE — Discharge Summary (Signed)
Physician Discharge Summary  Thomas Moore SWH:[id number] DOB: Jan 26, 1956 DOA: 05/26/2022  PCP: Lauro Regulus, MD  Admit date: 05/26/2022  Discharge date: 05/28/2022  Admitted From: Home.  Disposition:  Home  Recommendations for Outpatient Follow-up:  Follow up with PCP in 1-2 weeks. Please obtain BMP/CBC in one week. Advised to follow-up with cardiology Dr. Welton Flakes on Thursday Advised to take Xarelto 15 mg twice daily and Brilinta 90 mg twice daily.  Home Health:None Equipment/Devices:None  Discharge Condition: Stable CODE STATUS:Full code Diet recommendation: Heart Healthy   Brief [facility] Course: This 66 years old male with PMH significant for CAD s/p CABG, history of aortic stenosis s/p TAVR, history of seizure disorder, PE on Xarelto presented to the ED for the complaints of intermittent left arm pain which got worse.  He reports having this pain for a while, had recent stress test which was negative.  He also reports having chest pain over the left anterior chest wall but he stated it does not bother him as much is left arm pain.  He has taken nitroglycerin at home with some improvement.  He also reports shortness of breath associated with nonproductive cough.  EKG shows sinus tachycardia with diffuse ST depression, found to have elevated troponin.  Patient is started on heparin and admitted for further evaluation,  Cardiology is consulted.  Patient was evaluated by cardiology, initial plan was left heart cath but since patient was taking Xarelto so he had to be off Xarelto for 48 hours. Patient's chest pain has significantly resolved.  Cardiologist recommended Patient can be discharged home on Xarelto and Brilinta.  Patient is being discharged home.  Discharge Diagnoses:  Principal Problem:   NSTEMI (non-ST elevated myocardial infarction) (HCC) Active Problems:   Acute on chronic combined systolic and diastolic CHF (congestive heart failure) (HCC)   Uncontrolled  type 2 diabetes mellitus with hyperglycemia, without long-term current use of insulin (HCC)   Essential hypertension   Hypomagnesemia   History of seizure   Hx of pulmonary embolus  NSTEMI: Patient with significant history of CAD s/p CABG presented for the evaluation of left arm pain,  chest pain and found to have elevated troponin consistent with NSTEMI. Continue IV heparin for 48 hours. Continue aspirin, Plavix, nitrates, beta-blocker and high intensity statins 2D echo shows LVEF 40 to 45%, no RWMA Cardiology is consulted, unfortunately will not do cardiac catheterization because Xarelto has to be held for 48 hours.  Patient wants to be discharged.  Cardiology recommended chest pain has resolved and needs outpatient follow-up.   Acute on chronic combined systolic and diastolic CHF: Acute hypoxic respiratory failure: Patient reports shortness of breath and nonproductive cough. He was hypoxic on arrival with SpO2 85% on room air. Last known LVEF of 40 to 45% from a 2D echocardiogram which was done 08/25/21 Chest x-ray shows finding concerning for acute CHF. BNP level 830. Continue Lasix 40 mg IV daily Continue metoprolol and ramipril Continue supplemental oxygen and maintain saturation above 92%.   History of pulmonary embolism: Xarelto resumed.   History of seizure disorder: Continue Depakote. Seizure precautions   Hypomagnesemia Replaced.  Continue to monitor   Essential hypertension: Continue metoprolol and ramipril   Type 2 diabetes with hyperglycemia: Patient has hyperglycemia likely related to stress in the setting of NSTEMI Hold oral hypoglycemic agents Continue regular insulin sliding scale Maintain current system carbohydrate diet  Discharge Instructions  Discharge Instructions     Call MD for:  difficulty breathing, headache or visual disturbances  Complete by: As directed    Call MD for:  persistant dizziness or light-headedness   Complete by: As  directed    Call MD for:  persistant nausea and vomiting   Complete by: As directed    Diet - low sodium heart healthy   Complete by: As directed    Diet Carb Modified   Complete by: As directed    Discharge instructions   Complete by: As directed    Advised to follow-up with primary care physician in 1 week. Advised to follow-up with cardiology Dr. Lennette Bihari on Thursday Advised to take Xarelto 15 mg twice daily and Brilinta 90 mg twice daily.   Increase activity slowly   Complete by: As directed       Allergies as of 05/28/2022   No Known Allergies      Medication List     STOP taking these medications    amoxicillin 500 MG tablet Commonly known as: AMOXIL   clopidogrel 75 MG tablet Commonly known as: PLAVIX       TAKE these medications    acetaminophen 650 MG CR tablet Commonly known as: TYLENOL Take 1,300 mg by mouth every 8 (eight) hours as needed for pain.   atorvastatin 80 MG tablet Commonly known as: LIPITOR Take 1 tablet (80 mg total) by mouth at bedtime. What changed:  medication strength how much to take   CALCIUM + D PO Take 1 tablet by mouth 2 (two) times daily.   cyclobenzaprine 5 MG tablet Commonly known as: FLEXERIL Take 5 mg by mouth 3 (three) times daily as needed for muscle spasms.   divalproex 250 MG 24 hr tablet Commonly known as: DEPAKOTE ER Take 250 mg by mouth at bedtime.   furosemide 20 MG tablet Commonly known as: Lasix Take 1 tablet (20 mg total) by mouth daily.   glipiZIDE 2.5 MG 24 hr tablet Commonly known as: GLUCOTROL XL Take 2.5 mg by mouth daily with breakfast.   ipratropium 0.06 % nasal spray Commonly known as: ATROVENT Place 2 sprays into both nostrils daily as needed for rhinitis.   isosorbide mononitrate 30 MG 24 hr tablet Commonly known as: IMDUR Take 30 mg by mouth daily.   metFORMIN 500 MG 24 hr tablet Commonly known as: GLUCOPHAGE-XR TAKE TWO TABLETS BY MOUTH 2 TIMES A DAY What changed:  how much to  take when to take this   metoprolol succinate 100 MG 24 hr tablet Commonly known as: TOPROL-XL Take 1 tablet (100 mg total) by mouth daily.   multivitamin capsule Take 1 capsule by mouth daily.   nitroGLYCERIN 0.4 MG SL tablet Commonly known as: NITROSTAT Place 0.4 mg under the tongue every 5 (five) minutes as needed for chest pain.   pantoprazole 40 MG tablet Commonly known as: Protonix Take 1 tablet (40 mg total) by mouth daily.   ramipril 2.5 MG capsule Commonly known as: ALTACE Take 1 capsule (2.5 mg total) by mouth daily.   Rivaroxaban 15 MG Tabs tablet Commonly known as: XARELTO Take 1 tablet (15 mg total) by mouth 2 (two) times daily with a meal. What changed:  medication strength how much to take when to take this   ticagrelor 90 MG Tabs tablet Commonly known as: BRILINTA Take 1 tablet (90 mg total) by mouth 2 (two) times daily.   Trulicity 0.75 MG/0.5ML Sopn Generic drug: Dulaglutide Inject 0.75 mg into the skin once a week. Wednesday        Follow-up Information  Adrian Blackwater A, MD Follow up in 3 day(s).   Specialty: Cardiology Contact information: [address] [place] [address] [phone]                No Known Allergies  Consultations: Cardiology   Procedures/Studies: ECHOCARDIOGRAM COMPLETE  Result Date: 05/27/2022    ECHOCARDIOGRAM REPORT   Patient Name:   Thomas Moore Date of Exam: 05/27/2022 Medical Rec #:  [id number]      Height:       63.0 in Accession #:    [id number]     Weight:       150.0 lb Date of Birth:  06-Nov-1955       BSA:          1.711 m Patient Age:    66 years       BP:           114/77 mmHg Patient Gender: M              HR:           77 bpm. Exam Location:  Inpatient Procedure: 2D Echo, Cardiac Doppler, Color Doppler and Strain Analysis Indications:     CHF - acute systolic  History:         Patient has prior history of Echocardiogram examinations, most                  recent 08/10/2021. CAD, Prior  CABG, Stroke, Aortic Valve Disease;                  Risk Factors:Diabetes, Hypertension and Dyslipidemia. Hx of PE.                  Aortic Valve: 26 mm Edwards Ultra, stented (TAVR) valve is                  present in the aortic position. Procedure Date: 07/05/21.  Sonographer:     Milda Smart Referring Phys:  [id number] AGBATA Diagnosing Phys: Adrian Blackwater  Sonographer Comments: Technically difficult study due to poor echo windows. Image acquisition challenging due to patient body habitus and Image acquisition challenging due to respiratory motion. Global longitudinal strain was attempted. IMPRESSIONS  1. Left ventricular ejection fraction, by estimation, is 40 to 45%. The left ventricle has mildly decreased function. The left ventricle demonstrates regional wall motion abnormalities (see scoring diagram/findings for description). The left ventricular  internal cavity size was moderately dilated. There is moderate left ventricular hypertrophy. Left ventricular diastolic parameters are consistent with Grade III diastolic dysfunction (restrictive).  2. Right ventricular systolic function is mildly reduced. The right ventricular size is moderately enlarged.  3. Left atrial size was severely dilated.  4. Right atrial size was severely dilated.  5. The mitral valve is degenerative. Mild mitral valve regurgitation.  6. The aortic valve is calcified. Aortic valve regurgitation is mild. Aortic valve sclerosis/calcification is present, without any evidence of aortic stenosis. There is a 26 mm Edwards Ultra, stented (TAVR) valve present in the aortic position. Procedure Date: 07/05/21. Conclusion(s)/Recommendation(s): Findings consistent with ischemic cardiomyopathy. FINDINGS  Left Ventricle: Left ventricular ejection fraction, by estimation, is 40 to 45%. The left ventricle has mildly decreased function. The left ventricle demonstrates regional wall motion abnormalities. The left ventricular internal cavity size  was moderately dilated. There is moderate left ventricular hypertrophy. Left ventricular diastolic parameters are consistent with Grade III diastolic dysfunction (restrictive).  LV Wall Scoring: The basal anteroseptal segment and basal inferoseptal  segment are hypokinetic. Right Ventricle: The right ventricular size is moderately enlarged. No increase in right ventricular wall thickness. Right ventricular systolic function is mildly reduced. Left Atrium: Left atrial size was severely dilated. Right Atrium: Right atrial size was severely dilated. Pericardium: There is no evidence of pericardial effusion. Mitral Valve: The mitral valve is degenerative in appearance. Mild mitral valve regurgitation. MV peak gradient, 7.4 mmHg. The mean mitral valve gradient is 3.0 mmHg. Tricuspid Valve: The tricuspid valve is grossly normal. Tricuspid valve regurgitation is mild. Aortic Valve: The aortic valve is calcified. Aortic valve regurgitation is mild. Aortic valve sclerosis/calcification is present, without any evidence of aortic stenosis. Aortic valve mean gradient measures 4.0 mmHg. Aortic valve peak gradient measures 8.2 mmHg. There is a 26 mm Edwards Ultra, stented (TAVR) valve present in the aortic position. Procedure Date: 07/05/21. Pulmonic Valve: The pulmonic valve was grossly normal. Pulmonic valve regurgitation is trivial. Aorta: The aortic root, ascending aorta and aortic arch are all structurally normal, with no evidence of dilitation or obstruction. IAS/Shunts: No atrial level shunt detected by color flow Doppler.  LEFT VENTRICLE PLAX 2D LVIDd:         4.40 cm      Diastology LVIDs:         3.60 cm      LV e' medial:    2.93 cm/s LV PW:         1.00 cm      LV E/e' medial:  37.9 LV IVS:        0.90 cm      LV e' lateral:   5.70 cm/s                             LV E/e' lateral: 19.5  LV Volumes (MOD) LV vol d, MOD A2C: 136.0 ml LV vol d, MOD A4C: 163.0 ml LV vol s, MOD A2C: 75.5 ml LV vol s, MOD A4C: 88.2 ml LV SV  MOD A2C:     60.5 ml LV SV MOD A4C:     163.0 ml LV SV MOD BP:      67.8 ml RIGHT VENTRICLE            IVC RV S prime:     8.63 cm/s  IVC diam: 1.10 cm TAPSE (M-mode): 1.4 cm LEFT ATRIUM             Index        RIGHT ATRIUM           Index LA diam:        4.30 cm 2.51 cm/m   RA Area:     12.40 cm LA Vol (A2C):   33.6 ml 19.64 ml/m  RA Volume:   27.60 ml  16.13 ml/m LA Vol (A4C):   34.8 ml 20.34 ml/m LA Biplane Vol: 34.2 ml 19.99 ml/m  AORTIC VALVE AV Vmax:           143.00 cm/s AV Vmean:          99.000 cm/s AV VTI:            0.255 m AV Peak Grad:      8.2 mmHg AV Mean Grad:      4.0 mmHg LVOT Vmax:         125.00 cm/s LVOT Vmean:        85.900 cm/s LVOT VTI:          0.240 m  LVOT/AV VTI ratio: 0.94  AORTA Ao Asc diam: 3.50 cm MITRAL VALVE MV Area (PHT): 3.79 cm     SHUNTS MV Peak grad:  7.4 mmHg     Systemic VTI: 0.24 m MV Mean grad:  3.0 mmHg MV Vmax:       1.36 m/s MV Vmean:      83.9 cm/s MV Decel Time: 200 msec MV E velocity: 111.00 cm/s MV A velocity: 65.00 cm/s MV E/A ratio:  1.71 Shaukat Khan Electronically signed by Adrian Blackwater Signature Date/Time: 05/27/2022/1:54:29 PM    Final    DG Chest Port 1 View  Result Date: 05/26/2022 CLINICAL DATA:  Chest pain radiating to left arm. Hypoxia. Coronary artery disease. EXAM: PORTABLE CHEST 1 VIEW COMPARISON:  02/02/2022 FINDINGS: Prior CABG and aortic valve replacement noted. Cardiac loop recorder again seen. Stable mild cardiomegaly. Increased interstitial infiltrates seen in the lung bases, suspicious for interstitial edema. Pulmonary hyperinflation again seen, consistent with COPD. IMPRESSION: Increased bilateral lower lung interstitial infiltrates, suspicious for interstitial edema/congestive heart failure. COPD. Electronically Signed   By: Danae Orleans M.D.   On: 05/26/2022 07:26   CUP PACEART REMOTE DEVICE CHECK  Result Date: 05/15/2022 ILR summary report received. Battery status OK. Normal device function. No new symptom, tachy, brady, or  pause episodes. No new AF episodes. Monthly summary reports and ROV/PRN LA    Subjective: Patient seen and examined at bedside.  Overnight events noted.   Patient reports doing much better and wants to be discharged.   Patient is being discharged home.  Discharge Exam: Vitals:   05/28/22 0759 05/28/22 1126  BP: (!) 156/82 135/70  Pulse: 87 86  Resp: 20   Temp: 97.6 F (36.4 C) (!) 97.5 F (36.4 C)  SpO2: 100% 97%   Vitals:   05/28/22 0047 05/28/22 0421 05/28/22 0759 05/28/22 1126  BP: 115/68 123/74 (!) 156/82 135/70  Pulse: 80 74 87 86  Resp: Temp:  98 F (36.7 C) 97.6 F (36.4 C) (!) 97.5 F (36.4 C)  TempSrc:  Oral Axillary Oral  SpO2: 97% 91% 100% 97%  Weight:      Height:        General: Pt is alert, awake, not in acute distress Cardiovascular: RRR, S1/S2 +, no rubs, no gallops Respiratory: CTA bilaterally, no wheezing, no rhonchi Abdominal: Soft, NT, ND, bowel sounds + Extremities: no edema, no cyanosis    The results of significant diagnostics from this hospitalization (including imaging, microbiology, ancillary and laboratory) are listed below for reference.     Microbiology: Recent Results (from the past 240 hour(s))  Resp panel by RT-PCR (RSV, Flu A&B, Covid) Anterior Nasal Swab     Status: None   Collection Time: 05/26/22  7:34 AM   Specimen: Anterior Nasal Swab  Result Value Ref Range Status   SARS Coronavirus 2 by RT PCR NEGATIVE NEGATIVE Final    Comment: (NOTE) SARS-CoV-2 target nucleic acids are NOT DETECTED.  The SARS-CoV-2 RNA is generally detectable in upper respiratory specimens during the acute phase of infection. The lowest concentration of SARS-CoV-2 viral copies this assay can detect is 138 copies/mL. A negative result does not preclude SARS-Cov-2 infection and should not be used as the sole basis for treatment or other patient management decisions. A negative result may occur with  improper specimen collection/handling,  submission of specimen other than nasopharyngeal swab, presence of viral mutation(s) within the areas targeted by this assay, and inadequate number of viral copies(<138  copies/mL). A negative result must be combined with clinical observations, patient history, and epidemiological information. The expected result is Negative.  Fact Sheet for Patients:  BloggerCourse.com  Fact Sheet for Healthcare Providers:  SeriousBroker.it  This test is no t yet approved or cleared by the [place] FDA and  has been authorized for detection and/or diagnosis of SARS-CoV-2 by FDA under an Emergency Use Authorization (EUA). This EUA will remain  in effect (meaning this test can be used) for the duration of the COVID-19 declaration under Section 564(b)(1) of the Act, 21 U.S.C.section 360bbb-3(b)(1), unless the authorization is terminated  or revoked sooner.       Influenza A by PCR NEGATIVE NEGATIVE Final   Influenza B by PCR NEGATIVE NEGATIVE Final    Comment: (NOTE) The Xpert Xpress SARS-CoV-2/FLU/RSV plus assay is intended as an aid in the diagnosis of influenza from Nasopharyngeal swab specimens and should not be used as a sole basis for treatment. Nasal washings and aspirates are unacceptable for Xpert Xpress SARS-CoV-2/FLU/RSV testing.  Fact Sheet for Patients: BloggerCourse.com  Fact Sheet for Healthcare Providers: SeriousBroker.it  This test is not yet approved or cleared by the [place] FDA and has been authorized for detection and/or diagnosis of SARS-CoV-2 by FDA under an Emergency Use Authorization (EUA). This EUA will remain in effect (meaning this test can be used) for the duration of the COVID-19 declaration under Section 564(b)(1) of the Act, 21 U.S.C. section 360bbb-3(b)(1), unless the authorization is terminated or revoked.     Resp Syncytial Virus by PCR NEGATIVE  NEGATIVE Final    Comment: (NOTE) Fact Sheet for Patients: BloggerCourse.com  Fact Sheet for Healthcare Providers: SeriousBroker.it  This test is not yet approved or cleared by the [place] FDA and has been authorized for detection and/or diagnosis of SARS-CoV-2 by FDA under an Emergency Use Authorization (EUA). This EUA will remain in effect (meaning this test can be used) for the duration of the COVID-19 declaration under Section 564(b)(1) of the Act, 21 U.S.C. section 360bbb-3(b)(1), unless the authorization is terminated or revoked.  Performed at [facility] [address] [place], [place] [address]      Labs: BNP (last 3 results) Recent Labs    07/01/21 0848 05/26/22 1717  BNP 635.8* 830.0*   Basic Metabolic Panel: Recent Labs  Lab 05/26/22 0735 05/26/22 1436 05/27/22 0520  NA 139 138 138  K 4.1 3.9 4.1  CL 102 99 102  CO2 26 30 24   GLUCOSE 250* 209* 182*  BUN 20 19 24*  CREATININE 1.26* 1.30* 1.23  CALCIUM 9.1 9.0 9.2  MG 1.7 2.4  --    Liver Function Tests: Recent Labs  Lab 05/26/22 0735  AST 23  ALT 12  ALKPHOS 105  BILITOT 0.8  PROT 8.1  ALBUMIN 3.5   Recent Labs  Lab 05/26/22 0735  LIPASE 50   No results for input(s): "AMMONIA" in the last 168 hours. CBC: Recent Labs  Lab 05/26/22 0735 05/27/22 0520  WBC 7.5 6.1  NEUTROABS 6.2  --   HGB 11.7* 11.7*  HCT 37.1* 37.3*  MCV 87.5 88.2  PLT 155 142*   Cardiac Enzymes: No results for input(s): "CKTOTAL", "CKMB", "CKMBINDEX", "TROPONINI" in the last 168 hours. BNP: Invalid input(s): "POCBNP" CBG: Recent Labs  Lab 05/26/22 2224 05/27/22 0805 05/27/22 1148 05/27/22 1622 05/28/22 0829  GLUCAP 192* 190* 204* 131* 171*   D-Dimer No results for input(s): "DDIMER" in the last 72 hours. Hgb A1c Recent Labs  05/26/22 1222  HGBA1C 8.7*   Lipid Profile Recent Labs    05/27/22 0520  CHOL 141  HDL 32*   LDLCALC 77  TRIG 159*  CHOLHDL 4.4   Thyroid function studies No results for input(s): "TSH", "T4TOTAL", "T3FREE", "THYROIDAB" in the last 72 hours.  Invalid input(s): "FREET3" Anemia work up No results for input(s): "VITAMINB12", "FOLATE", "FERRITIN", "TIBC", "IRON", "RETICCTPCT" in the last 72 hours. Urinalysis    Component Value Date/Time   COLORURINE YELLOW 07/01/2021 0847   APPEARANCEUR CLEAR 07/01/2021 0847   APPEARANCEUR Clear 05/12/2020 0940   LABSPEC 1.018 07/01/2021 0847   PHURINE 6.0 07/01/2021 0847   GLUCOSEU NEGATIVE 07/01/2021 0847   HGBUR SMALL (A) 07/01/2021 0847   BILIRUBINUR NEGATIVE 07/01/2021 0847   BILIRUBINUR Negative 05/12/2020 0940   KETONESUR 5 (A) 07/01/2021 0847   PROTEINUR 100 (A) 07/01/2021 0847   NITRITE NEGATIVE 07/01/2021 0847   LEUKOCYTESUR NEGATIVE 07/01/2021 0847   Sepsis Labs Recent Labs  Lab 05/26/22 0735 05/27/22 0520  WBC 7.5 6.1   Microbiology Recent Results (from the past 240 hour(s))  Resp panel by RT-PCR (RSV, Flu A&B, Covid) Anterior Nasal Swab     Status: None   Collection Time: 05/26/22  7:34 AM   Specimen: Anterior Nasal Swab  Result Value Ref Range Status   SARS Coronavirus 2 by RT PCR NEGATIVE NEGATIVE Final    Comment: (NOTE) SARS-CoV-2 target nucleic acids are NOT DETECTED.  The SARS-CoV-2 RNA is generally detectable in upper respiratory specimens during the acute phase of infection. The lowest concentration of SARS-CoV-2 viral copies this assay can detect is 138 copies/mL. A negative result does not preclude SARS-Cov-2 infection and should not be used as the sole basis for treatment or other patient management decisions. A negative result may occur with  improper specimen collection/handling, submission of specimen other than nasopharyngeal swab, presence of viral mutation(s) within the areas targeted by this assay, and inadequate number of viral copies(<138 copies/mL). A negative result must be combined  with clinical observations, patient history, and epidemiological information. The expected result is Negative.  Fact Sheet for Patients:  BloggerCourse.com  Fact Sheet for Healthcare Providers:  SeriousBroker.it  This test is no t yet approved or cleared by the [place] FDA and  has been authorized for detection and/or diagnosis of SARS-CoV-2 by FDA under an Emergency Use Authorization (EUA). This EUA will remain  in effect (meaning this test can be used) for the duration of the COVID-19 declaration under Section 564(b)(1) of the Act, 21 U.S.C.section 360bbb-3(b)(1), unless the authorization is terminated  or revoked sooner.       Influenza A by PCR NEGATIVE NEGATIVE Final   Influenza B by PCR NEGATIVE NEGATIVE Final    Comment: (NOTE) The Xpert Xpress SARS-CoV-2/FLU/RSV plus assay is intended as an aid in the diagnosis of influenza from Nasopharyngeal swab specimens and should not be used as a sole basis for treatment. Nasal washings and aspirates are unacceptable for Xpert Xpress SARS-CoV-2/FLU/RSV testing.  Fact Sheet for Patients: BloggerCourse.com  Fact Sheet for Healthcare Providers: SeriousBroker.it  This test is not yet approved or cleared by the [place] FDA and has been authorized for detection and/or diagnosis of SARS-CoV-2 by FDA under an Emergency Use Authorization (EUA). This EUA will remain in effect (meaning this test can be used) for the duration of the COVID-19 declaration under Section 564(b)(1) of the Act, 21 U.S.C. section 360bbb-3(b)(1), unless the authorization is terminated or revoked.     Resp Syncytial  Virus by PCR NEGATIVE NEGATIVE Final    Comment: (NOTE) Fact Sheet for Patients: BloggerCourse.com  Fact Sheet for Healthcare Providers: SeriousBroker.it  This test is not yet approved  or cleared by the [place] FDA and has been authorized for detection and/or diagnosis of SARS-CoV-2 by FDA under an Emergency Use Authorization (EUA). This EUA will remain in effect (meaning this test can be used) for the duration of the COVID-19 declaration under Section 564(b)(1) of the Act, 21 U.S.C. section 360bbb-3(b)(1), unless the authorization is terminated or revoked.  Performed at [facility], [address] [place], [place] [address]      Time coordinating discharge: Over 30 minutes  SIGNED:   Cipriano Bunker, MD  [facility] 05/28/2022, 2:55 PM Pager   If 7PM-7AM, please contact night-coverage

## 2022-05-28 NOTE — Progress Notes (Signed)
SUBJECTIVE: No further left arm pain and never had chest pain.   Vitals:   05/28/22 0042 05/28/22 0047 05/28/22 0421 05/28/22 0759  BP: 123/69 115/68 123/74 (!) 156/82  Pulse: 87 80 74 87  Resp: 20 20 20 20   Temp:   98 F (36.7 C) 97.6 F (36.4 C)  TempSrc:   Oral Axillary  SpO2: 97% 97% 91% 100%  Weight:      Height:        Intake/Output Summary (Last 24 hours) at 05/28/2022 1102 Last data filed at 05/28/2022 1015 Gross per 24 hour  Intake 556.77 ml  Output 675 ml  Net -118.23 ml    LABS: Basic Metabolic Panel: Recent Labs    05/26/22 0735 05/26/22 1436 05/27/22 0520  NA 139 138 138  K 4.1 3.9 4.1  CL 102 99 102  CO2 26 30 24   GLUCOSE 250* 209* 182*  BUN 20 19 24*  CREATININE 1.26* 1.30* 1.23  CALCIUM 9.1 9.0 9.2  MG 1.7 2.4  --    Liver Function Tests: Recent Labs    05/26/22 0735  AST 23  ALT 12  ALKPHOS 105  BILITOT 0.8  PROT 8.1  ALBUMIN 3.5   Recent Labs    05/26/22 0735  LIPASE 50   CBC: Recent Labs    05/26/22 0735 05/27/22 0520  WBC 7.5 6.1  NEUTROABS 6.2  --   HGB 11.7* 11.7*  HCT 37.1* 37.3*  MCV 87.5 88.2  PLT 155 142*   Cardiac Enzymes: No results for input(s): "CKTOTAL", "CKMB", "CKMBINDEX", "TROPONINI" in the last 72 hours. BNP: Invalid input(s): "POCBNP" D-Dimer: No results for input(s): "DDIMER" in the last 72 hours. Hemoglobin A1C: Recent Labs    05/26/22 1222  HGBA1C 8.7*   Fasting Lipid Panel: Recent Labs    05/27/22 0520  CHOL 141  HDL 32*  LDLCALC 77  TRIG 05/28/22*  CHOLHDL 4.4   Thyroid Function Tests: No results for input(s): "TSH", "T4TOTAL", "T3FREE", "THYROIDAB" in the last 72 hours.  Invalid input(s): "FREET3" Anemia Panel: No results for input(s): "VITAMINB12", "FOLATE", "FERRITIN", "TIBC", "IRON", "RETICCTPCT" in the last 72 hours.   PHYSICAL EXAM General: Well developed, well nourished, in no acute distress HEENT:  Normocephalic and atramatic Neck:  No JVD.  Lungs: Clear bilaterally to  auscultation and percussion. Heart: HRRR . Normal S1 and S2 without gallops or murmurs.  Abdomen: Bowel sounds are positive, abdomen soft and non-tender  Msk:  Back normal, normal gait. Normal strength and tone for age. Extremities: No clubbing, cyanosis or edema.   Neuro: Alert and oriented X 3. Psych:  Good affect, responds appropriately  TELEMETRY: Normal sinus rhythm  ASSESSMENT AND PLAN: Non-STEMI with history of extensive coronary artery disease status post CABG and TAVR.  Patient probably completed myocardial infarction and thus no longer having left arm pain.  Heparin and Brilinta resolved with arm pain.  Heparin can be discontinued and Brilinta and Xarelto 15 mg can be restarted and can go home with follow-up in the office Thursday at 10 AM.  Principal Problem:   NSTEMI (non-ST elevated myocardial infarction) (HCC) Active Problems:   Uncontrolled type 2 diabetes mellitus with hyperglycemia, without long-term current use of insulin (HCC)   Essential hypertension   Hypomagnesemia   History of seizure   Hx of pulmonary embolus   Acute on chronic combined systolic and diastolic CHF (congestive heart failure) (HCC)    Thomas Moore A, MD, [facility] 05/28/2022 11:02 AM

## 2022-05-28 NOTE — Consult Note (Signed)
ANTICOAGULATION CONSULT NOTE   Pharmacy Consult for Heparin Infusion Indication: chest pain/ACS  No Known Allergies  Patient Measurements: Height: 5\' 3"  (160 cm) Weight: 68 kg (150 lb) IBW/kg (Calculated) : 56.9 Heparin Dosing Weight: 68 kg  Vital Signs: Temp: 98 F (36.7 C) (12/24 0421) Temp Source: Oral (12/24 0421) BP: 123/74 (12/24 0421) Pulse Rate: 74 (12/24 0421)  Labs: Recent Labs    05/26/22 0735 05/26/22 0947 05/26/22 1058 05/26/22 1100 05/26/22 1334 05/26/22 1436 05/26/22 1713 05/26/22 1717 05/26/22 2013 05/27/22 0152 05/27/22 0520 05/27/22 0802 05/28/22 0624  HGB 11.7*  --   --   --   --   --   --   --   --   --  11.7*  --   --   HCT 37.1*  --   --   --   --   --   --   --   --   --  37.3*  --   --   PLT 155  --   --   --   --   --   --   --   --   --  142*  --   --   APTT  --   --   --  36  --   --   --    < >  --  80*  --  85* 75*  LABPROT  --   --   --  15.9*  --   --   --   --   --   --   --   --   --   INR  --   --   --  1.3*  --   --   --   --   --   --   --   --   --   HEPARINUNFRC  --   --  >1.10*  --   --   --   --   --   --   --   --  0.58 0.50  CREATININE 1.26*  --   --   --   --  1.30*  --   --   --   --  1.23  --   --   TROPONINIHS 66*   < >  --   --    < > 13,823* 16,723*  --  15,439*  --   --   --   --    < > = values in this interval not displayed.     Estimated Creatinine Clearance: 47.5 mL/min (by C-G formula based on SCr of 1.23 mg/dL).   Medical History: Past Medical History:  Diagnosis Date   CAD (coronary artery disease) of artery bypass graft    s/p CABG x 4 in 1997   COPD (chronic obstructive pulmonary disease) (HCC)    Coronary artery disease    CVA (cerebral vascular accident) (HCC)    Diabetes mellitus without complication (HCC)    Dysrhythmia    GERD (gastroesophageal reflux disease)    HLD (hyperlipidemia)    Hypertension    S/P TAVR (transcatheter aortic valve replacement) 07/05/2021   with Edwards 28mm S3UR  via TF approach with Dr. 38m and Dr. Excell Seltzer   Seizures [facility])     Medications:  Scheduled:   aspirin EC  81 mg Oral Daily   atorvastatin  80 mg Oral QHS   calcium-vitamin D  1 tablet Oral BID WC   divalproex  250 mg Oral QHS   furosemide  40 mg Intravenous Daily   insulin aspart  0-15 Units Subcutaneous TID WC   isosorbide mononitrate  30 mg Oral Daily   metoprolol succinate  100 mg Oral Daily   multivitamin with minerals  1 tablet Oral Daily   pantoprazole  40 mg Oral Daily   ramipril  2.5 mg Oral Daily   ticagrelor  90 mg Oral BID   Infusions:   heparin 1,000 Units/hr (05/26/22 2011)   PRN: acetaminophen, cyclobenzaprine, nitroGLYCERIN, ondansetron (ZOFRAN) IV  Assessment: Thomas Moore is a 66 y.o. male presenting with NSTEMI. PMH significant for CAD (CABG, CVA, TAVR), HLD, HTN, T2DM, COPD. Patient was on [facility] PTA per chart review (Xarelto for history of PE). Last dose Xarelto 20mg  PTA was 12/21 at 1800 per patient recollection. Pharmacy has been consulted to initiate and manage heparin infusion.   Baseline Labs: aPTT 36, HL >1.10, PT 15.9, INR 1.3, Hgb 11.7, Hct 37.1, Plt 155  Goal of Therapy:  Heparin level 0.3-0.7 units/ml aPTT 66-102 seconds Monitor platelets by anticoagulation protocol: Yes   Date Time aPTT/HL Rate/Comment  12/22 1717 53/---  800/SUBtherapeutic 12/23 0152 80/---  1000/therapeutic x1 12/23 0802 85/0.58 1000/therapeutic x2, HL appears to correlate 12/24   0624   75/0.50            1000/therapeutic X 3  Plan:  12/24 @ [id number]:  HL = 0.50, aPTT = 75, therapeutic X 3 - Will use HL to guide dosing now that correlating with aPTT - Will continue pt on current rate and recheck HL on 12/25 with AM labs.   Continue to monitor H&H and platelets daily while on heparin infusion   Thomas Moore D 05/28/2022 7:14 AM

## 2022-05-31 LAB — LIPOPROTEIN A (LPA): Lipoprotein (a): 239.7 nmol/L — ABNORMAL HIGH (ref <75.0)

## 2022-06-01 ENCOUNTER — Ambulatory Visit: Payer: Self-pay | Admitting: Cardiovascular Disease

## 2022-06-01 DIAGNOSIS — R079 Chest pain, unspecified: Secondary | ICD-10-CM | POA: Insufficient documentation

## 2022-06-01 MED ORDER — SODIUM CHLORIDE 0.9% FLUSH
3.0000 mL | Freq: Two times a day (BID) | INTRAVENOUS | Status: DC
  Filled 2022-06-01: qty 3

## 2022-06-08 ENCOUNTER — Ambulatory Visit: Payer: Self-pay | Admitting: Cardiovascular Disease

## 2022-06-08 MED ORDER — SODIUM CHLORIDE 0.9% FLUSH: 3.0000 mL | Freq: Two times a day (BID) | INTRAVENOUS | Status: DC

## 2022-06-09 ENCOUNTER — Ambulatory Visit
Admission: RE | Admit: 2022-06-09 | Discharge: 2022-06-09 | Disposition: A | Discharge status: Home / Self Care | Payer: 59 | Attending: Cardiovascular Disease | Admitting: Cardiovascular Disease

## 2022-06-09 ENCOUNTER — Encounter
Admission: RE | Disposition: A | Discharge status: Home / Self Care | Payer: Self-pay | Source: Home / Self Care | Attending: Cardiovascular Disease

## 2022-06-09 ENCOUNTER — Other Ambulatory Visit: Payer: Self-pay

## 2022-06-09 ENCOUNTER — Encounter: Payer: Self-pay | Admitting: Cardiovascular Disease

## 2022-06-09 DIAGNOSIS — R079 Chest pain, unspecified: Secondary | ICD-10-CM | POA: Insufficient documentation

## 2022-06-09 DIAGNOSIS — I25119 Atherosclerotic heart disease of native coronary artery with unspecified angina pectoris: Secondary | ICD-10-CM | POA: Insufficient documentation

## 2022-06-09 DIAGNOSIS — I2582 Chronic total occlusion of coronary artery: Secondary | ICD-10-CM | POA: Diagnosis not present

## 2022-06-09 HISTORY — PX: LEFT HEART CATH AND CORONARY ANGIOGRAPHY: CATH118249

## 2022-06-09 LAB — GLUCOSE, CAPILLARY: Glucose-Capillary: 185 mg/dL — ABNORMAL HIGH (ref 70–99)

## 2022-06-09 SURGERY — LEFT HEART CATH AND CORONARY ANGIOGRAPHY
Anesthesia: Moderate Sedation

## 2022-06-09 SURGERY — LEFT HEART CATH AND CORONARY ANGIOGRAPHY
Anesthesia: Moderate Sedation | Laterality: Left

## 2022-06-09 MED ORDER — ASPIRIN 81 MG PO CHEW
CHEWABLE_TABLET | ORAL | Status: AC
  Filled 2022-06-09: qty 1

## 2022-06-09 MED ORDER — ASPIRIN 81 MG PO CHEW
81.0000 mg | CHEWABLE_TABLET | ORAL | Status: AC
  Administered 2022-06-09: 81 mg via ORAL

## 2022-06-09 MED ORDER — SODIUM CHLORIDE 0.9 % WEIGHT BASED INFUSION
3.0000 mL/kg/h | INTRAVENOUS | Status: DC
  Administered 2022-06-09: 3 mL/kg/h via INTRAVENOUS

## 2022-06-09 MED ORDER — SODIUM CHLORIDE 0.9 % WEIGHT BASED INFUSION: 1.0000 mL/kg/h | INTRAVENOUS | Status: DC

## 2022-06-09 MED ORDER — FENTANYL CITRATE (PF) 100 MCG/2ML IJ SOLN
INTRAMUSCULAR | Status: AC
  Filled 2022-06-09: qty 2

## 2022-06-09 MED ORDER — FENTANYL CITRATE (PF) 100 MCG/2ML IJ SOLN
INTRAMUSCULAR | Status: DC | PRN
  Administered 2022-06-09: 25 ug via INTRAVENOUS

## 2022-06-09 MED ORDER — SODIUM CHLORIDE 0.9 % IV SOLN: 250.0000 mL | INTRAVENOUS | Status: DC | PRN

## 2022-06-09 MED ORDER — MIDAZOLAM HCL 2 MG/2ML IJ SOLN
INTRAMUSCULAR | Status: AC
  Filled 2022-06-09: qty 2

## 2022-06-09 MED ORDER — HEPARIN (PORCINE) IN NACL 1000-0.9 UT/500ML-% IV SOLN
INTRAVENOUS | Status: DC | PRN
  Administered 2022-06-09 (×2): 500 mL

## 2022-06-09 MED ORDER — SODIUM CHLORIDE 0.9% FLUSH: 3.0000 mL | INTRAVENOUS | Status: DC | PRN

## 2022-06-09 MED ORDER — HEPARIN (PORCINE) IN NACL 1000-0.9 UT/500ML-% IV SOLN
INTRAVENOUS | Status: AC
  Filled 2022-06-09: qty 1000

## 2022-06-09 MED ORDER — IOHEXOL 300 MG/ML  SOLN
INTRAMUSCULAR | Status: DC | PRN
  Administered 2022-06-09: 91 mL

## 2022-06-09 MED ORDER — MIDAZOLAM HCL 2 MG/2ML IJ SOLN
INTRAMUSCULAR | Status: DC | PRN
  Administered 2022-06-09: 1 mg via INTRAVENOUS

## 2022-06-09 SURGICAL SUPPLY — 15 items
CATH ANGIO 5F JB2 100CM (CATHETERS) IMPLANT
CATH INFINITI 5 FR IM (CATHETERS) IMPLANT
CATH INFINITI 5FR JL4 (CATHETERS) IMPLANT
CATH INFINITI JR4 5F (CATHETERS) IMPLANT
DEVICE CLOSURE MYNXGRIP 5F (Vascular Products) IMPLANT
KIT SYRINGE INJ CVI SPIKEX1 (MISCELLANEOUS) IMPLANT
NDL PERC 18GX7CM (NEEDLE) IMPLANT
NEEDLE PERC 18GX7CM (NEEDLE) ×1 IMPLANT
PACK CARDIAC CATH (CUSTOM PROCEDURE TRAY) ×2 IMPLANT
PROTECTION STATION PRESSURIZED (MISCELLANEOUS) ×1
SET ATX SIMPLICITY (MISCELLANEOUS) IMPLANT
SHEATH AVANTI 5FR X 11CM (SHEATH) IMPLANT
STATION PROTECTION PRESSURIZED (MISCELLANEOUS) IMPLANT
WIRE EMERALD 3MM-J .035X260CM (WIRE) IMPLANT
WIRE GUIDERIGHT .035X150 (WIRE) IMPLANT

## 2022-06-13 ENCOUNTER — Encounter: Payer: Medicare Other | Admitting: Family

## 2022-06-19 ENCOUNTER — Ambulatory Visit (INDEPENDENT_AMBULATORY_CARE_PROVIDER_SITE_OTHER): Payer: 59

## 2022-06-19 DIAGNOSIS — R55 Syncope and collapse: Secondary | ICD-10-CM | POA: Diagnosis not present

## 2022-06-19 NOTE — Progress Notes (Signed)
[facility] Summary Report / Loop Recorder

## 2022-06-20 ENCOUNTER — Telehealth: Payer: Self-pay | Admitting: Physician Assistant

## 2022-06-20 LAB — CUP PACEART REMOTE DEVICE CHECK
Date Time Interrogation Session: 20240112231010
Implantable Pulse Generator Implant Date: 20210503

## 2022-06-20 NOTE — Telephone Encounter (Signed)
  [facility]ENTER   MULTIDISCIPLINARY HEART VALVE TEAM   Pt was supposed to see [place] this Friday for 1 year structural heart follow up but cancelled due to transportation issues. His TAVR was on 07/05/21. He was recently admitted to the hospital for NSTEMI and had a cath. Also had an echo 05/27/22 with normal valve function (copied below). Also seen by Dr. Humphrey Rolls yesterday. KCCQ filled out. He has NYHA class I symptoms. He does not need to come back to [facility] for any further structural heart follow up and will continue with Dr. Humphrey Rolls.    [place] Cardiomyopathy Questionnaire     06/20/2022   11:54 AM 08/10/2021    2:57 PM 05/18/2021    9:10 AM  KCCQ-12  1 a. Ability to shower/bathe Not at all limited Not at all limited Not at all limited  1 b. Ability to walk 1 block Not at all limited Other, Did not do Slightly limited  1 c. Ability to hurry/jog Other, Did not do Other, Did not do Other, Did not do  2. Edema feet/ankles/legs Never over the past 2 weeks Never over the past 2 weeks Never over the past 2 weeks  3. Limited by fatigue Never over the past 2 weeks Never over the past 2 weeks 1-2 times a week  4. Limited by dyspnea Never over the past 2 weeks Never over the past 2 weeks Never over the past 2 weeks  5. Sitting up / on 3+ pillows Never over the past 2 weeks Never over the past 2 weeks Never over the past 2 weeks  6. Limited enjoyment of life Not limited at all Not limited at all Not limited at all  7. Rest of life w/ symptoms Completely satisfied Completely satisfied Mostly satisfied  8 a. Participation in hobbies Did not limit at all Did not limit at all Slightly limited  8 b. Participation in chores Did not limit at all Did not limit at all Did not limit at all  8 c. Visiting family/friends Did not limit at all Did not limit at all Did not limit at all      Echo 05/27/22 IMPRESSIONS   1. Left ventricular ejection fraction, by estimation, is 40 to 45%. The  left  ventricle has mildly decreased function. The left ventricle  demonstrates regional wall motion abnormalities (see scoring  diagram/findings for description). The left ventricular   internal cavity size was moderately dilated. There is moderate left  ventricular hypertrophy. Left ventricular diastolic parameters are  consistent with Grade III diastolic dysfunction (restrictive).   2. Right ventricular systolic function is mildly reduced. The right  ventricular size is moderately enlarged.   3. Left atrial size was severely dilated.   4. Right atrial size was severely dilated.   5. The mitral valve is degenerative. Mild mitral valve regurgitation.   6. The aortic valve is calcified. Aortic valve regurgitation is mild.  Aortic valve sclerosis/calcification is present, without any evidence of  aortic stenosis. There is a 26 mm Edwards Ultra, stented (TAVR) valve  present in the aortic position.  Procedure Date: 07/05/21.   Conclusion(s)/Recommendation(s): Findings consistent with ischemic  cardiomyopathy.    Angelena Form PA-C  [facility]

## 2022-06-23 ENCOUNTER — Ambulatory Visit: Payer: Medicare HMO

## 2022-06-23 ENCOUNTER — Other Ambulatory Visit (HOSPITAL_COMMUNITY): Payer: Self-pay

## 2022-06-23 ENCOUNTER — Encounter (HOSPITAL_COMMUNITY): Payer: Self-pay

## 2022-06-27 NOTE — Progress Notes (Unsigned)
Patient ID: Thomas Moore, male    DOB: 1956-06-05, 67 y.o.   MRN: [id number]  HPI  Thomas Moore is a 67 y/o male with a history of CAD, DM, hyperlipidemia, HTN, stroke, COPD, GERD, TAVR, seizures, previous tobacco use and chronic heart failure.   Echo 05/27/22 showed an EF of 40-45% along with moderate LVH, severe LAE, mild Thomas/ AR with TAVR valve.   LHC done 06/09/22 and showed:   Prox RCA lesion is 100% stenosed.   Mid LM to Dist LM lesion is 85% stenosed.   Mid LAD lesion is 70% stenosed.   1st Mrg lesion is 75% stenosed.   Origin lesion is 100% stenosed.  It appears that SVG to PDA is occluded which was probably the culprit for the non-STEMI.  Native RCA is 100% occluded proximally but has bridging collaterals from proximal to distal RCA.  SVG to OM and LIMA to the LAD are patent.  Advise aggressive medical therapy with Brilinta and Xarelto.  Continue high-dose statins.  Admitted 05/26/22 due to left arm pain and chest pain.   He presents today for his initial visit with a chief complaint of minimal fatigue upon moderate exertion. Describes this as chronic in nature. Has associated shortness of breath along with this. Denies any difficulty sleeping, dizziness, abdominal distention, palpitations, pedal edema, chest pain, cough or weight gain.   Using trulicity weekly. Not weighing daily but does have scales at home.   Past Medical History:  Diagnosis Date   CAD (coronary artery disease) of artery bypass graft    s/p CABG x 4 in 1997   CHF (congestive heart failure) (HCC)    COPD (chronic obstructive pulmonary disease) (HCC)    Coronary artery disease    CVA (cerebral vascular accident) ([place])    Diabetes mellitus without complication ([place])    Dysrhythmia    GERD (gastroesophageal reflux disease)    HLD (hyperlipidemia)    Hypertension    S/P TAVR (transcatheter aortic valve replacement) 07/05/2021   with Edwards 53mm S3UR via TF approach with Dr. Burt Knack and Dr. Cyndia Bent   Seizures  [facility])    Past Surgical History:  Procedure Laterality Date   BYPASS GRAFT ANGIOGRAPHY N/A 04/25/2021   Procedure: BYPASS GRAFT ANGIOGRAPHY;  Surgeon: Andrez Grime, MD;  Location: [place] CV [facility];  Service: Cardiovascular;  Laterality: N/A;   CARDIAC SURGERY     CORNEAL TRANSPLANT Right    CORONARY ARTERY BYPASS GRAFT  1997   ELECTROPHYSIOLOGY STUDY N/A 09/08/2021   Procedure: ELECTROPHYSIOLOGY STUDY;  Surgeon: Vickie Epley, MD;  Location: [place] [facility];  Service: Cardiovascular;  Laterality: N/A;   EYE SURGERY     FLEXOR TENDON REPAIR Left 07/11/2019   Procedure: FLEXOR tenolysis  REPAIR LEFT RING FINGER with tednon repair;  Surgeon: Hessie Knows, MD;  Location: [facility] ORS;  Service: Orthopedics;  Laterality: Left;   INCISION AND DRAINAGE ABSCESS Left 05/08/2019   Procedure: INCISION AND DRAINAGE ABSCESS;  Surgeon: Dereck Leep, MD;  Location: [facility] ORS;  Service: Orthopedics;  Laterality: Left;   INTRAOPERATIVE TRANSTHORACIC ECHOCARDIOGRAM N/A 07/05/2021   Procedure: INTRAOPERATIVE TRANSTHORACIC ECHOCARDIOGRAM;  Surgeon: Sherren Mocha, MD;  Location: [place];  Service: Open Heart Surgery;  Laterality: N/A;   LEFT HEART CATH AND CORONARY ANGIOGRAPHY Left 06/09/2022   Procedure: LEFT HEART CATH AND CORONARY ANGIOGRAPHY;  Surgeon: Dionisio David, MD;  Location: [place] CV [facility];  Service: Cardiovascular;  Laterality: Left;   LEFT HEART CATH AND CORS/GRAFTS  ANGIOGRAPHY N/A 10/02/2019   Procedure: LEFT HEART CATH AND CORS/GRAFTS ANGIOGRAPHY;  Surgeon: Marykay Lex, MD;  Location: [facility] INVASIVE CV LAB;  Service: Cardiovascular;  Laterality: N/A;   LOOP RECORDER INSERTION N/A 10/06/2019   Procedure: LOOP RECORDER INSERTION;  Surgeon: Marinus Maw, MD;  Location: [facility] INVASIVE CV LAB;  Service: Cardiovascular;  Laterality: N/A;   TRANSCATHETER AORTIC VALVE REPLACEMENT, TRANSFEMORAL N/A 07/05/2021   Procedure: TRANSCATHETER AORTIC VALVE REPLACEMENT, TRANSFEMORAL;  Surgeon:  Tonny Bollman, MD;  Location: [facility] OR;  Service: Open Heart Surgery;  Laterality: N/A;   TRIGGER FINGER RELEASE Left    Family History  Problem Relation Age of Onset   Seizures Brother    Social History   Tobacco Use   Smoking status: Former    Types: Cigarettes    Quit date: 1984    Years since quitting: 40.0   Smokeless tobacco: Never   Tobacco comments:    Quit 40 years ago  Substance Use Topics   Alcohol use: Not Currently   No Known Allergies Prior to Admission medications   Medication Sig Start Date End Date Taking? Authorizing Provider  acetaminophen (TYLENOL) 650 MG CR tablet Take 1,300 mg by mouth every 8 (eight) hours as needed for pain.   Yes [provider]  amoxicillin (AMOXIL) 500 MG tablet Take 1,000 mg by mouth as needed. 06/18/22  Yes [provider]  atorvastatin (LIPITOR) 80 MG tablet Take 1 tablet (80 mg total) by mouth at bedtime. 05/28/22  Yes Thomas Bunker, MD  Calcium Citrate-Vitamin D (CALCIUM + D PO) Take 1 tablet by mouth 2 (two) times daily.   Yes [provider]  divalproex (DEPAKOTE ER) 250 MG 24 hr tablet Take 250 mg by mouth 2 (two) times daily. Take 250mg  in AM and 500mg  qHs 01/26/21  Yes [provider]  furosemide (LASIX) 20 MG tablet Take 1 tablet (20 mg total) by mouth daily. 04/28/21  Yes 01/28/21, MD  isosorbide mononitrate (IMDUR) 30 MG 24 hr tablet Take 30 mg by mouth daily. 12/28/20  Yes [provider]  metoprolol succinate (TOPROL-XL) 100 MG 24 hr tablet Take 1 tablet (100 mg total) by mouth daily. Patient taking differently: Take 200 mg by mouth daily. 03/25/20  Yes Iloabachie, Chioma E, NP  Multiple Vitamin (MULTIVITAMIN) capsule Take 1 capsule by mouth daily.   Yes [provider]  nitroGLYCERIN (NITROSTAT) 0.4 MG SL tablet Place 0.4 mg under the tongue every 5 (five) minutes as needed for chest pain. 01/27/21 10/05/22 Yes [provider]  omeprazole (PRILOSEC) 40 MG  capsule Take 40 mg by mouth daily. 06/02/22  Yes [provider]  ramipril (ALTACE) 2.5 MG capsule Take 1 capsule (2.5 mg total) by mouth daily. 04/28/21  Yes 06/04/22, MD  Rivaroxaban (XARELTO) 15 MG TABS tablet Take 1 tablet (15 mg total) by mouth 2 (two) times daily with a meal. 05/28/22  Yes Lurene Shadow, MD  ticagrelor (BRILINTA) 90 MG TABS tablet Take 1 tablet (90 mg total) by mouth 2 (two) times daily. 05/28/22  Yes Thomas Bunker, MD  TRULICITY 0.75 MG/0.5ML SOPN Inject 0.75 mg into the skin once a week. Wednesday Patient taking differently: Inject 4.5 mg into the skin once a week. Tuesday 02/19/20  Yes Iloabachie, Chioma E, NP   Review of Systems  Constitutional:  Positive for fatigue (improving). Negative for appetite change.  HENT:  Negative for congestion, postnasal drip and sore throat.   Eyes: Negative.   Respiratory:  Positive for shortness of breath (minimal). Negative for chest tightness.   Cardiovascular:  Negative for chest pain, palpitations and leg swelling.  Gastrointestinal:  Negative for abdominal distention and abdominal pain.  Endocrine: Negative.   Genitourinary: Negative.   Musculoskeletal:  Negative for back pain and neck pain.  Skin: Negative.   Allergic/Immunologic: Negative.   Neurological:  Negative for dizziness and light-headedness.  Hematological:  Negative for adenopathy. Does not bruise/bleed easily.  Psychiatric/Behavioral:  Negative for dysphoric mood and sleep disturbance (sleeping on 1 pillow). The patient is not nervous/anxious.    Vitals:   06/28/22 1111  BP: 131/62  Pulse: 65  Resp: 18  SpO2: 97%  Weight: 149 lb (67.6 kg)   Wt Readings from Last 3 Encounters:  06/28/22 149 lb (67.6 kg)  06/09/22 149 lb 14.6 oz (68 kg)  05/26/22 150 lb (68 kg)   Lab Results  Component Value Date   CREATININE 1.23 05/27/2022   CREATININE 1.30 (H) 05/26/2022   CREATININE 1.26 (H) 05/26/2022   Physical Exam Vitals and nursing note  reviewed.  Constitutional:      Appearance: Normal appearance.  HENT:     Head: Normocephalic and atraumatic.  Cardiovascular:     Rate and Rhythm: Normal rate and regular rhythm.  Pulmonary:     Effort: Pulmonary effort is normal. No respiratory distress.     Breath sounds: No wheezing or rales.  Abdominal:     General: There is no distension.     Palpations: Abdomen is soft.     Tenderness: There is no abdominal tenderness.  Musculoskeletal:        General: No tenderness.     Cervical back: Normal range of motion and neck supple.     Right lower leg: Edema (trace pitting) present.     Left lower leg: Edema (trace pitting) present.  Skin:    General: Skin is warm and dry.  Neurological:     Mental Status: He is alert and oriented to person, place, and time. Mental status is at baseline.  Psychiatric:        Mood and Affect: Mood normal.        Behavior: Behavior normal.        Thought Content: Thought content normal.   Assessment & Plan:  1: Chronic heart failure with mildly reduced ejection fraction- - NYHA class II - euvolemic today - has scales but hasn't been weighing; instructed to weigh daily and call for an overnight weight gain of > 2 pounds or a weekly weight gain of > 5 pounds - not adding salt to his food - GDMT metoprolol, ramipril - begin farxiga 10mg  daily; voucher provided - BMP next visit - sees cardiology Thomas Moore) 07/24/22 - BNP 05/26/22 was 830.0 - PharmD reconciled meds w/ patient  2: HTN- - BP 131/62 - saw PCP (Thomas Moore) 06/02/22 - BMP 06/02/22 showed sodium 137, potassium 4.2, creatinine 1.3 and GFR 61  3: DM- - home glucose was 180 this morning - weekly trulicity  4: CAD- - taking both brilinta and xarelto - cath done 06/2022   Medication bottles reviewed.   Return in 1 month, sooner if needed.

## 2022-06-28 ENCOUNTER — Ambulatory Visit: Payer: 59 | Attending: Family | Admitting: Family

## 2022-06-28 ENCOUNTER — Other Ambulatory Visit (HOSPITAL_COMMUNITY): Payer: Self-pay

## 2022-06-28 ENCOUNTER — Encounter: Payer: Self-pay | Admitting: Pharmacist

## 2022-06-28 ENCOUNTER — Encounter: Payer: Self-pay | Admitting: Family

## 2022-06-28 VITALS — BP 131/62 | HR 65 | Resp 18 | Wt 149.0 lb

## 2022-06-28 DIAGNOSIS — I2582 Chronic total occlusion of coronary artery: Secondary | ICD-10-CM | POA: Diagnosis not present

## 2022-06-28 DIAGNOSIS — R079 Chest pain, unspecified: Secondary | ICD-10-CM | POA: Diagnosis not present

## 2022-06-28 DIAGNOSIS — J449 Chronic obstructive pulmonary disease, unspecified: Secondary | ICD-10-CM | POA: Insufficient documentation

## 2022-06-28 DIAGNOSIS — I110 Hypertensive heart disease with heart failure: Secondary | ICD-10-CM | POA: Diagnosis not present

## 2022-06-28 DIAGNOSIS — M79602 Pain in left arm: Secondary | ICD-10-CM | POA: Insufficient documentation

## 2022-06-28 DIAGNOSIS — Z7985 Long-term (current) use of injectable non-insulin antidiabetic drugs: Secondary | ICD-10-CM | POA: Diagnosis not present

## 2022-06-28 DIAGNOSIS — E785 Hyperlipidemia, unspecified: Secondary | ICD-10-CM | POA: Insufficient documentation

## 2022-06-28 DIAGNOSIS — Z8673 Personal history of transient ischemic attack (TIA), and cerebral infarction without residual deficits: Secondary | ICD-10-CM | POA: Insufficient documentation

## 2022-06-28 DIAGNOSIS — E119 Type 2 diabetes mellitus without complications: Secondary | ICD-10-CM

## 2022-06-28 DIAGNOSIS — K219 Gastro-esophageal reflux disease without esophagitis: Secondary | ICD-10-CM | POA: Diagnosis not present

## 2022-06-28 DIAGNOSIS — Z7902 Long term (current) use of antithrombotics/antiplatelets: Secondary | ICD-10-CM | POA: Insufficient documentation

## 2022-06-28 DIAGNOSIS — R0602 Shortness of breath: Secondary | ICD-10-CM | POA: Diagnosis not present

## 2022-06-28 DIAGNOSIS — I10 Essential (primary) hypertension: Secondary | ICD-10-CM | POA: Diagnosis not present

## 2022-06-28 DIAGNOSIS — Z952 Presence of prosthetic heart valve: Secondary | ICD-10-CM | POA: Diagnosis not present

## 2022-06-28 DIAGNOSIS — I5022 Chronic systolic (congestive) heart failure: Secondary | ICD-10-CM | POA: Diagnosis not present

## 2022-06-28 DIAGNOSIS — I2510 Atherosclerotic heart disease of native coronary artery without angina pectoris: Secondary | ICD-10-CM

## 2022-06-28 MED ORDER — DAPAGLIFLOZIN PROPANEDIOL 10 MG PO TABS: 10.0000 mg | ORAL_TABLET | Freq: Every day | ORAL | 5 refills | Status: DC

## 2022-06-28 NOTE — Patient Instructions (Addendum)
Begin weighing daily and call for an overnight weight gain of 3 pounds or more or a weekly weight gain of more than 5 pounds.   If you have voicemail, please make sure your mailbox is cleaned out so that we may leave a message and please make sure to listen to any voicemails.     Start taking farxiga as 1 tablet every morning.

## 2022-06-30 ENCOUNTER — Telehealth: Payer: Self-pay

## 2022-06-30 NOTE — Telephone Encounter (Signed)
LINQ alert received. 2 tachy events, 1 1/25 @ 19:32 shows 12sec of VT, rate 261, spontaneously resolves - route to triage  Spoke with patient, patient doesn't recall any symptoms from date and time.

## 2022-07-03 NOTE — Telephone Encounter (Signed)
Patient scheduled to see Dr. Quentin Ore on 2/28.

## 2022-07-11 ENCOUNTER — Other Ambulatory Visit (INDEPENDENT_AMBULATORY_CARE_PROVIDER_SITE_OTHER): Payer: Self-pay | Admitting: Vascular Surgery

## 2022-07-11 DIAGNOSIS — I6523 Occlusion and stenosis of bilateral carotid arteries: Secondary | ICD-10-CM

## 2022-07-13 ENCOUNTER — Telehealth: Payer: Self-pay

## 2022-07-13 DIAGNOSIS — I10 Essential (primary) hypertension: Secondary | ICD-10-CM

## 2022-07-13 DIAGNOSIS — I480 Paroxysmal atrial fibrillation: Secondary | ICD-10-CM

## 2022-07-13 NOTE — Telephone Encounter (Signed)
Alert received from [facility]:  Two new AF episodes. AF burden is 0.6% of the time.  The longest was 56 minutes, ? [place].  There was one tachy episode that was a run of VT.  Sent to triage to address these two issues, VT and AF.     Known VT issue-Pt with upcoming appt.  Reviewing potential afib episodes-plot graph would suggest afib, but it appears there may be P waves?  Rate is fast so difficult to discern.  Will forward to Dr. Quentin Ore to review.  Pt has scheduled appt to discuss loop recorder findings August 02, 2022 with Dr. Quentin Ore.  Will continue to  monitor.

## 2022-07-14 ENCOUNTER — Encounter: Payer: Self-pay | Admitting: Student

## 2022-07-14 ENCOUNTER — Telehealth: Payer: Self-pay

## 2022-07-14 ENCOUNTER — Emergency Department: Payer: 59

## 2022-07-14 ENCOUNTER — Other Ambulatory Visit: Payer: Self-pay

## 2022-07-14 ENCOUNTER — Encounter (INDEPENDENT_AMBULATORY_CARE_PROVIDER_SITE_OTHER): Payer: Medicare HMO

## 2022-07-14 ENCOUNTER — Ambulatory Visit (INDEPENDENT_AMBULATORY_CARE_PROVIDER_SITE_OTHER): Payer: Medicare HMO | Admitting: Vascular Surgery

## 2022-07-14 ENCOUNTER — Inpatient Hospital Stay
Admission: EM | Admit: 2022-07-14 | Discharge: 2022-07-17 | DRG: 281 | Disposition: A | Discharge status: Home Health | Payer: 59 | Attending: Internal Medicine | Admitting: Internal Medicine

## 2022-07-14 DIAGNOSIS — Z952 Presence of prosthetic heart valve: Secondary | ICD-10-CM | POA: Diagnosis not present

## 2022-07-14 DIAGNOSIS — I480 Paroxysmal atrial fibrillation: Secondary | ICD-10-CM | POA: Insufficient documentation

## 2022-07-14 DIAGNOSIS — I214 Non-ST elevation (NSTEMI) myocardial infarction: Secondary | ICD-10-CM | POA: Diagnosis present

## 2022-07-14 DIAGNOSIS — I25110 Atherosclerotic heart disease of native coronary artery with unstable angina pectoris: Secondary | ICD-10-CM | POA: Diagnosis present

## 2022-07-14 DIAGNOSIS — I130 Hypertensive heart and chronic kidney disease with heart failure and stage 1 through stage 4 chronic kidney disease, or unspecified chronic kidney disease: Secondary | ICD-10-CM | POA: Diagnosis present

## 2022-07-14 DIAGNOSIS — E785 Hyperlipidemia, unspecified: Secondary | ICD-10-CM | POA: Diagnosis present

## 2022-07-14 DIAGNOSIS — Z8673 Personal history of transient ischemic attack (TIA), and cerebral infarction without residual deficits: Secondary | ICD-10-CM

## 2022-07-14 DIAGNOSIS — R000 Tachycardia, unspecified: Secondary | ICD-10-CM | POA: Diagnosis present

## 2022-07-14 DIAGNOSIS — I2582 Chronic total occlusion of coronary artery: Secondary | ICD-10-CM | POA: Diagnosis present

## 2022-07-14 DIAGNOSIS — I5042 Chronic combined systolic (congestive) and diastolic (congestive) heart failure: Secondary | ICD-10-CM | POA: Diagnosis present

## 2022-07-14 DIAGNOSIS — Z87891 Personal history of nicotine dependence: Secondary | ICD-10-CM | POA: Diagnosis not present

## 2022-07-14 DIAGNOSIS — J449 Chronic obstructive pulmonary disease, unspecified: Secondary | ICD-10-CM | POA: Diagnosis present

## 2022-07-14 DIAGNOSIS — G40909 Epilepsy, unspecified, not intractable, without status epilepticus: Secondary | ICD-10-CM | POA: Diagnosis present

## 2022-07-14 DIAGNOSIS — D649 Anemia, unspecified: Secondary | ICD-10-CM | POA: Diagnosis present

## 2022-07-14 DIAGNOSIS — E1110 Type 2 diabetes mellitus with ketoacidosis without coma: Secondary | ICD-10-CM | POA: Diagnosis not present

## 2022-07-14 DIAGNOSIS — I2510 Atherosclerotic heart disease of native coronary artery without angina pectoris: Secondary | ICD-10-CM

## 2022-07-14 DIAGNOSIS — K219 Gastro-esophageal reflux disease without esophagitis: Secondary | ICD-10-CM | POA: Diagnosis present

## 2022-07-14 DIAGNOSIS — I351 Nonrheumatic aortic (valve) insufficiency: Secondary | ICD-10-CM | POA: Diagnosis present

## 2022-07-14 DIAGNOSIS — I25700 Atherosclerosis of coronary artery bypass graft(s), unspecified, with unstable angina pectoris: Secondary | ICD-10-CM | POA: Diagnosis present

## 2022-07-14 DIAGNOSIS — Z7984 Long term (current) use of oral hypoglycemic drugs: Secondary | ICD-10-CM | POA: Diagnosis not present

## 2022-07-14 DIAGNOSIS — Z947 Corneal transplant status: Secondary | ICD-10-CM

## 2022-07-14 DIAGNOSIS — Z7985 Long-term (current) use of injectable non-insulin antidiabetic drugs: Secondary | ICD-10-CM

## 2022-07-14 DIAGNOSIS — Z86711 Personal history of pulmonary embolism: Secondary | ICD-10-CM | POA: Diagnosis present

## 2022-07-14 DIAGNOSIS — E1122 Type 2 diabetes mellitus with diabetic chronic kidney disease: Secondary | ICD-10-CM | POA: Diagnosis present

## 2022-07-14 DIAGNOSIS — Z602 Problems related to living alone: Secondary | ICD-10-CM | POA: Diagnosis present

## 2022-07-14 DIAGNOSIS — I5022 Chronic systolic (congestive) heart failure: Secondary | ICD-10-CM | POA: Insufficient documentation

## 2022-07-14 DIAGNOSIS — Z7901 Long term (current) use of anticoagulants: Secondary | ICD-10-CM | POA: Diagnosis not present

## 2022-07-14 DIAGNOSIS — E871 Hypo-osmolality and hyponatremia: Secondary | ICD-10-CM | POA: Diagnosis present

## 2022-07-14 DIAGNOSIS — R079 Chest pain, unspecified: Principal | ICD-10-CM

## 2022-07-14 DIAGNOSIS — I10 Essential (primary) hypertension: Secondary | ICD-10-CM | POA: Diagnosis present

## 2022-07-14 DIAGNOSIS — I471 Supraventricular tachycardia, unspecified: Secondary | ICD-10-CM | POA: Diagnosis present

## 2022-07-14 DIAGNOSIS — I252 Old myocardial infarction: Secondary | ICD-10-CM

## 2022-07-14 DIAGNOSIS — N179 Acute kidney failure, unspecified: Secondary | ICD-10-CM | POA: Diagnosis present

## 2022-07-14 DIAGNOSIS — N183 Chronic kidney disease, stage 3 unspecified: Secondary | ICD-10-CM | POA: Diagnosis present

## 2022-07-14 DIAGNOSIS — Z87898 Personal history of other specified conditions: Secondary | ICD-10-CM | POA: Diagnosis not present

## 2022-07-14 DIAGNOSIS — H9190 Unspecified hearing loss, unspecified ear: Secondary | ICD-10-CM | POA: Diagnosis present

## 2022-07-14 DIAGNOSIS — E119 Type 2 diabetes mellitus without complications: Secondary | ICD-10-CM | POA: Diagnosis present

## 2022-07-14 DIAGNOSIS — E8729 Other acidosis: Secondary | ICD-10-CM | POA: Insufficient documentation

## 2022-07-14 LAB — BLOOD GAS, VENOUS
Acid-base deficit: 2.3 mmol/L — ABNORMAL HIGH (ref 0.0–2.0)
Bicarbonate: 22.5 mmol/L (ref 20.0–28.0)
O2 Saturation: 49.4 %
Patient temperature: 37
pCO2, Ven: 38 mmHg — ABNORMAL LOW (ref 44–60)
pH, Ven: 7.38 (ref 7.25–7.43)
pO2, Ven: 35 mmHg (ref 32–45)

## 2022-07-14 LAB — COMPREHENSIVE METABOLIC PANEL
ALT: 17 U/L (ref 0–44)
AST: 49 U/L — ABNORMAL HIGH (ref 15–41)
Albumin: 3.3 g/dL — ABNORMAL LOW (ref 3.5–5.0)
Alkaline Phosphatase: 69 U/L (ref 38–126)
Anion gap: 14 (ref 5–15)
BUN: 42 mg/dL — ABNORMAL HIGH (ref 8–23)
CO2: 18 mmol/L — ABNORMAL LOW (ref 22–32)
Calcium: 7.5 mg/dL — ABNORMAL LOW (ref 8.9–10.3)
Chloride: 97 mmol/L — ABNORMAL LOW (ref 98–111)
Creatinine, Ser: 1.99 mg/dL — ABNORMAL HIGH (ref 0.61–1.24)
GFR, Estimated: 36 mL/min — ABNORMAL LOW (ref >60)
Glucose, Bld: 196 mg/dL — ABNORMAL HIGH (ref 70–99)
Potassium: 3.2 mmol/L — ABNORMAL LOW (ref 3.5–5.1)
Sodium: 129 mmol/L — ABNORMAL LOW (ref 135–145)
Total Bilirubin: 0.7 mg/dL (ref 0.3–1.2)
Total Protein: 7.3 g/dL (ref 6.5–8.1)

## 2022-07-14 LAB — CBC
HCT: 33.5 % — ABNORMAL LOW (ref 39.0–52.0)
Hemoglobin: 10.7 g/dL — ABNORMAL LOW (ref 13.0–17.0)
MCH: 28.1 pg (ref 26.0–34.0)
MCHC: 31.9 g/dL (ref 30.0–36.0)
MCV: 87.9 fL (ref 80.0–100.0)
Platelets: 130 10*3/uL — ABNORMAL LOW (ref 150–400)
RBC: 3.81 MIL/uL — ABNORMAL LOW (ref 4.22–5.81)
RDW: 15.8 % — ABNORMAL HIGH (ref 11.5–15.5)
WBC: 4.7 10*3/uL (ref 4.0–10.5)
nRBC: 0 % (ref 0.0–0.2)

## 2022-07-14 LAB — TROPONIN I (HIGH SENSITIVITY)
Troponin I (High Sensitivity): 1296 ng/L (ref <18)
Troponin I (High Sensitivity): 1724 ng/L (ref <18)
Troponin I (High Sensitivity): 2166 ng/L (ref <18)

## 2022-07-14 LAB — MAGNESIUM: Magnesium: 1.9 mg/dL (ref 1.7–2.4)

## 2022-07-14 LAB — PROTIME-INR
INR: 2.3 — ABNORMAL HIGH (ref 0.8–1.2)
Prothrombin Time: 25.1 seconds — ABNORMAL HIGH (ref 11.4–15.2)

## 2022-07-14 LAB — BASIC METABOLIC PANEL
Anion gap: 17 — ABNORMAL HIGH (ref 5–15)
BUN: 40 mg/dL — ABNORMAL HIGH (ref 8–23)
CO2: 18 mmol/L — ABNORMAL LOW (ref 22–32)
Calcium: 7.7 mg/dL — ABNORMAL LOW (ref 8.9–10.3)
Chloride: 94 mmol/L — ABNORMAL LOW (ref 98–111)
Creatinine, Ser: 2.04 mg/dL — ABNORMAL HIGH (ref 0.61–1.24)
GFR, Estimated: 35 mL/min — ABNORMAL LOW (ref >60)
Glucose, Bld: 169 mg/dL — ABNORMAL HIGH (ref 70–99)
Potassium: 3.7 mmol/L (ref 3.5–5.1)
Sodium: 129 mmol/L — ABNORMAL LOW (ref 135–145)

## 2022-07-14 LAB — APTT: aPTT: 54 seconds — ABNORMAL HIGH (ref 24–36)

## 2022-07-14 LAB — BRAIN NATRIURETIC PEPTIDE: B Natriuretic Peptide: 219.4 pg/mL — ABNORMAL HIGH (ref 0.0–100.0)

## 2022-07-14 LAB — PHOSPHORUS: Phosphorus: 3.6 mg/dL (ref 2.5–4.6)

## 2022-07-14 LAB — CBG MONITORING, ED: Glucose-Capillary: 144 mg/dL — ABNORMAL HIGH (ref 70–99)

## 2022-07-14 LAB — GLUCOSE, CAPILLARY: Glucose-Capillary: 212 mg/dL — ABNORMAL HIGH (ref 70–99)

## 2022-07-14 LAB — HEPARIN LEVEL (UNFRACTIONATED): Heparin Unfractionated: >1.1 IU/mL — ABNORMAL HIGH (ref 0.30–0.70)

## 2022-07-14 MED ORDER — SODIUM CHLORIDE 0.9 % IV BOLUS
500.0000 mL | Freq: Once | INTRAVENOUS | Status: AC
  Administered 2022-07-14: 500 mL via INTRAVENOUS

## 2022-07-14 MED ORDER — INSULIN ASPART 100 UNIT/ML IJ SOLN
0.0000 [IU] | Freq: Every day | INTRAMUSCULAR | Status: DC
  Administered 2022-07-14: 2 [IU] via SUBCUTANEOUS
  Filled 2022-07-14: qty 1

## 2022-07-14 MED ORDER — ASPIRIN 81 MG PO CHEW
324.0000 mg | CHEWABLE_TABLET | Freq: Once | ORAL | Status: DC
  Filled 2022-07-14: qty 4

## 2022-07-14 MED ORDER — HEPARIN BOLUS VIA INFUSION
4000.0000 [IU] | Freq: Once | INTRAVENOUS | Status: AC
  Administered 2022-07-15: 4000 [IU] via INTRAVENOUS
  Filled 2022-07-14: qty 4000

## 2022-07-14 MED ORDER — CLOPIDOGREL BISULFATE 75 MG PO TABS: 75.0000 mg | ORAL_TABLET | Freq: Every day | ORAL | 3 refills | Status: DC

## 2022-07-14 MED ORDER — DIVALPROEX SODIUM ER 250 MG PO TB24: 250.0000 mg | ORAL_TABLET | Freq: Two times a day (BID) | ORAL | Status: DC

## 2022-07-14 MED ORDER — METOPROLOL SUCCINATE ER 100 MG PO TB24: 100.0000 mg | ORAL_TABLET | Freq: Every day | ORAL | 3 refills | Status: DC

## 2022-07-14 MED ORDER — ONDANSETRON HCL 4 MG PO TABS: 4.0000 mg | ORAL_TABLET | Freq: Four times a day (QID) | ORAL | Status: DC | PRN

## 2022-07-14 MED ORDER — POTASSIUM CHLORIDE CRYS ER 20 MEQ PO TBCR
40.0000 meq | EXTENDED_RELEASE_TABLET | Freq: Once | ORAL | Status: AC
  Administered 2022-07-14: 40 meq via ORAL
  Filled 2022-07-14: qty 2

## 2022-07-14 MED ORDER — DIVALPROEX SODIUM ER 250 MG PO TB24
250.0000 mg | ORAL_TABLET | Freq: Every day | ORAL | Status: DC
  Administered 2022-07-15 – 2022-07-17 (×3): 250 mg via ORAL
  Filled 2022-07-14 (×3): qty 1

## 2022-07-14 MED ORDER — POTASSIUM CHLORIDE 10 MEQ/100ML IV SOLN
10.0000 meq | INTRAVENOUS | Status: AC
  Administered 2022-07-14 – 2022-07-15 (×2): 10 meq via INTRAVENOUS
  Filled 2022-07-14 (×2): qty 100

## 2022-07-14 MED ORDER — ONDANSETRON HCL 4 MG/2ML IJ SOLN: 4.0000 mg | Freq: Four times a day (QID) | INTRAMUSCULAR | Status: DC | PRN

## 2022-07-14 MED ORDER — INSULIN ASPART 100 UNIT/ML IJ SOLN
0.0000 [IU] | Freq: Three times a day (TID) | INTRAMUSCULAR | Status: DC
  Administered 2022-07-15: 2 [IU] via SUBCUTANEOUS
  Administered 2022-07-15: 3 [IU] via SUBCUTANEOUS
  Administered 2022-07-15 – 2022-07-16 (×2): 1 [IU] via SUBCUTANEOUS
  Administered 2022-07-16: 2 [IU] via SUBCUTANEOUS
  Administered 2022-07-16: 3 [IU] via SUBCUTANEOUS
  Administered 2022-07-17: 1 [IU] via SUBCUTANEOUS
  Filled 2022-07-14 (×6): qty 1

## 2022-07-14 MED ORDER — HEPARIN (PORCINE) 25000 UT/250ML-% IV SOLN
1100.0000 [IU]/h | INTRAVENOUS | Status: DC
  Administered 2022-07-15: 1100 [IU]/h via INTRAVENOUS
  Filled 2022-07-14: qty 250

## 2022-07-14 MED ORDER — INSULIN ASPART 100 UNIT/ML IJ SOLN: 5.0000 [IU] | Freq: Once | INTRAMUSCULAR | Status: DC

## 2022-07-14 MED ORDER — APIXABAN 5 MG PO TABS: 5.0000 mg | ORAL_TABLET | Freq: Two times a day (BID) | ORAL | 3 refills | Status: DC

## 2022-07-14 MED ORDER — DIVALPROEX SODIUM ER 500 MG PO TB24
500.0000 mg | ORAL_TABLET | Freq: Every day | ORAL | Status: DC
  Administered 2022-07-14 – 2022-07-16 (×3): 500 mg via ORAL
  Filled 2022-07-14 (×3): qty 1

## 2022-07-14 NOTE — Consult Note (Addendum)
ANTICOAGULATION CONSULT NOTE - Initial Consult  Pharmacy Consult for heparin infusion Indication: atrial fibrillation and history of PE  No Known Allergies  Patient Measurements: Height: 5' 3"$  (160 cm) Weight: 68.6 kg (151 lb 3.2 oz) IBW/kg (Calculated) : 56.9 Heparin Dosing Weight: 68.6kg  Vital Signs: Temp: 98.4 F (36.9 C) (02/09 1538) Temp Source: Oral (02/09 1538) BP: 136/80 (02/09 1541) Pulse Rate: 143 (02/09 1615)  Labs: Recent Labs    07/14/22 1538 07/14/22 1604  HGB 10.7*  --   HCT 33.5*  --   PLT 130*  --   APTT  --  54*  LABPROT  --  25.1*  INR  --  2.3*  CREATININE 2.04*  --   TROPONINIHS  --  1,296*    Estimated Creatinine Clearance: 30.6 mL/min (A) (by C-G formula based on SCr of 2.04 mg/dL (H)).   Medical History: Past Medical History:  Diagnosis Date   CAD (coronary artery disease) of artery bypass graft    s/p CABG x 4 in 1997   CHF (congestive heart failure) (HCC)    COPD (chronic obstructive pulmonary disease) (HCC)    Coronary artery disease    CVA (cerebral vascular accident) ([place])    Diabetes mellitus without complication ([place])    Dysrhythmia    GERD (gastroesophageal reflux disease)    HLD (hyperlipidemia)    Hypertension    S/P TAVR (transcatheter aortic valve replacement) 07/05/2021   with Edwards 29m S3UR via TF approach with Dr. CBurt Knackand Dr. BCyndia Bent  Seizures ([facility]     Medications:  PTA: Xarelto 236mdaily with breakfast (last dose on 2/9 morning)  Inpatient: Heparin infusion (2/10 >>) Allergies: NKDA  Assessment: [age]ear old male with history of PE and atrial fibrillation on xarelto presents to ED with report of left sided chest pain,   Goal of Therapy:  Heparin level 0.3-0.7 units/ml aPTT 66-102 seconds Monitor platelets by anticoagulation protocol: Yes   Date Time aPTT/HL Rate/Comment   Plan:  Patient took dose of Xarelto this morning. Given no acute thromboembolism burden, will time parenteral anticoagulation  at time of next DOAC (tomorrow morning at 0800) Give 4000 units bolus x1; then start heparin infusion at 1100 units/hr tomorrow morning at 0800 Check aPTT/Anti-Xa level in 8 hours and daily once consecutively therapeutic.  Titrate by aPTT's until lab correlation is noted, then titrate by anti-xa alone. Continue to monitor H&H and platelets daily while on heparin gtt.   An[place]ktonharmacist 07/14/2022 5:25 PM

## 2022-07-14 NOTE — Telephone Encounter (Signed)
Patient called about his " heart monitor" in his chest someone from the hospital called him and told him that his HR is high and he's had some L arm discomfort please advise what youd like pt todo, send to front desk if patient need appt

## 2022-07-14 NOTE — Telephone Encounter (Signed)
Spoke with patient, patient is seemingly a poor historian, patient stated that he had not been taking Metoprolol every day due to not having any refills. Advised patient that I would review with Dr. Quentin Ore and call him back,  Reviewed with Dr. Quentin Ore will order refills of Toprol also per Dr. Quentin Ore stop Brilinta and start Plavix 31m and Eliquis 542mBID due to new atrial fibrillation, patietn scheduled to see Dr. LaQuentin Oren 08/02/22.  Will also forward msg to HF team and Dr. KhHumphrey Rollss FYJuluis Rainier
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
## 2022-07-14 NOTE — ED Provider Notes (Signed)
[facility] Provider Note    Event Date/Time   First MD Initiated Contact with Patient 07/14/22 1553     (approximate)   History   Atrial Fibrillation   HPI  Thomas Moore is a 67 y.o. male history of pulmonary embolism on Xarelto, seizure disorder, aortic stenosis with TAVR.  Of note I am slightly confused to the exact history as the patient seems to have a little bit of a poor historian.  He reports to me that he is currently taking Xarelto and took it this morning, but his cardiologist sent a new prescription for him for new heart medicine in the last day that he has not been able to pick up.  In this note patient reports that he is supposed to be on Eliquis.  Patient tells me that he is seeing 2 heart doctors 1 [facility] in [place] and 1 here Dr. Humphrey Rolls in [place]  Nonetheless, he came in today when he noticed that he was having left-sided chest pain.  He took 2 nitroglycerin at home this morning which helped his pain.  Then when he walked back-and-forth to the mailbox he got very short of breath.  He is currently not having any pain.  He got a phone call telling him his heart rate was too fast and advising him to come to the emergency department.     Physical Exam   Triage Vital Signs: ED Triage Vitals  Enc Vitals Group     BP 07/14/22 1541 136/80     Pulse Rate 07/14/22 1538 (!) 120     Resp 07/14/22 1538 17     Temp 07/14/22 1538 98.4 F (36.9 C)     Temp Source 07/14/22 1538 Oral     SpO2 07/14/22 1538 100 %     Weight --      Height --      Head Circumference --      Peak Flow --      Pain Score 07/14/22 1538 4     Pain Loc --      Pain Edu? --      Excl. in [place]? --     Most recent vital signs: Vitals:   07/14/22 2115 07/14/22 2147  BP: 105/77 114/79  Pulse: (!) 112 97  Resp: (!) 23 20  Temp:  97.9 F (36.6 C)  SpO2: 94% 100%     General: Awake, no distress.  CV:  Good peripheral perfusion.  Slight tachycardia heart rate  approximately 120 to by palpation. Resp:  Normal effort.  Clear bilateral.  Denies chest pain at this time Abd:  No distention.  Soft nontender nondistended Other:  No lower extremity edema   ED Results / Procedures / Treatments   Labs (all labs ordered are listed, but only abnormal results are displayed) Labs Reviewed  BASIC METABOLIC PANEL - Abnormal; Notable for the following components:      Result Value   Sodium 129 (*)    Chloride 94 (*)    CO2 18 (*)    Glucose, Bld 169 (*)    BUN 40 (*)    Creatinine, Ser 2.04 (*)    Calcium 7.7 (*)    GFR, Estimated 35 (*)    Anion gap 17 (*)    All other components within normal limits  CBC - Abnormal; Notable for the following components:   RBC 3.81 (*)    Hemoglobin 10.7 (*)    HCT 33.5 (*)  RDW 15.8 (*)    Platelets 130 (*)    All other components within normal limits  PROTIME-INR - Abnormal; Notable for the following components:   Prothrombin Time 25.1 (*)    INR 2.3 (*)    All other components within normal limits  APTT - Abnormal; Notable for the following components:   aPTT 54 (*)    All other components within normal limits  BRAIN NATRIURETIC PEPTIDE - Abnormal; Notable for the following components:   B Natriuretic Peptide 219.4 (*)    All other components within normal limits  HEPARIN LEVEL (UNFRACTIONATED) - Abnormal; Notable for the following components:   Heparin Unfractionated >1.10 (*)    All other components within normal limits  BLOOD GAS, VENOUS - Abnormal; Notable for the following components:   pCO2, Ven 38 (*)    Acid-base deficit 2.3 (*)    All other components within normal limits  COMPREHENSIVE METABOLIC PANEL - Abnormal; Notable for the following components:   Sodium 129 (*)    Potassium 3.2 (*)    Chloride 97 (*)    CO2 18 (*)    Glucose, Bld 196 (*)    BUN 42 (*)    Creatinine, Ser 1.99 (*)    Calcium 7.5 (*)    Albumin 3.3 (*)    AST 49 (*)    GFR, Estimated 36 (*)    All other  components within normal limits  GLUCOSE, CAPILLARY - Abnormal; Notable for the following components:   Glucose-Capillary 212 (*)    All other components within normal limits  CBG MONITORING, ED - Abnormal; Notable for the following components:   Glucose-Capillary 144 (*)    All other components within normal limits  TROPONIN I (HIGH SENSITIVITY) - Abnormal; Notable for the following components:   Troponin I (High Sensitivity) 1,296 (*)    All other components within normal limits  TROPONIN I (HIGH SENSITIVITY) - Abnormal; Notable for the following components:   Troponin I (High Sensitivity) 1,724 (*)    All other components within normal limits  TROPONIN I (HIGH SENSITIVITY) - Abnormal; Notable for the following components:   Troponin I (High Sensitivity) 2,166 (*)    All other components within normal limits  MAGNESIUM  PHOSPHORUS  CBC  MAGNESIUM  BRAIN NATRIURETIC PEPTIDE  VITAMIN B12  FOLATE  IRON AND TIBC  FERRITIN  RETICULOCYTES  COMPREHENSIVE METABOLIC PANEL  PHOSPHORUS     EKG  EKG interpreted by me at 1540 heart rate 110 QRS 90 QTc 420 Sinus tachycardia, mild nonspecific T wave abnormality noted throughout.  May represent a subendocardial ischemic or more global ischemic picture.  No STEMI   RADIOLOGY  DG Chest 2 View  Result Date: 07/14/2022 CLINICAL DATA:  afib rvr EXAM: CHEST - 2 VIEW COMPARISON:  05/26/2022 FINDINGS: Previous coronary bypass changes, TAVR, cardiac recorder noted. Stable mild cardiomegaly. Previous CHF pattern has resolved. Similar basilar atelectasis/scarring. No new collapse, consolidation, current CHF or effusion. Aorta atherosclerotic. Trachea midline. Bones are osteopenic and degenerative changes noted spine. No acute compression fracture appreciated. IMPRESSION: 1. Cardiomegaly with basilar scarring/atelectasis. 2. No interval change or acute process by plain radiography. Electronically Signed   By: Jerilynn Mages.  Shick M.D.   On: 07/14/2022 16:31     Chest x-ray interpreted by me as negative for acute disease.   PROCEDURES:  Critical Care performed: Yes, see critical care procedure note(s)  CRITICAL CARE Performed by: Delman Kitten   Total critical care time: 30 minutes  Critical care  time was exclusive of separately billable procedures and treating other patients.  Critical care was necessary to treat or prevent imminent or life-threatening deterioration.  Critical care was time spent personally by me on the following activities: development of treatment plan with patient and/or surrogate as well as nursing, discussions with consultants, evaluation of patient's response to treatment, examination of patient, obtaining history from patient or surrogate, ordering and performing treatments and interventions, ordering and review of laboratory studies, ordering and review of radiographic studies, pulse oximetry and re-evaluation of patient's condition.   Procedures   MEDICATIONS ORDERED IN ED: Medications  heparin bolus via infusion 4,000 Units (has no administration in time range)  heparin ADULT infusion 100 units/mL (25000 units/224m) (has no administration in time range)  insulin aspart (novoLOG) injection 0-9 Units (has no administration in time range)  insulin aspart (novoLOG) injection 0-5 Units (has no administration in time range)  ondansetron (ZOFRAN) tablet 4 mg (has no administration in time range)    Or  ondansetron (ZOFRAN) injection 4 mg (has no administration in time range)  divalproex (DEPAKOTE ER) 24 hr tablet 250 mg (has no administration in time range)    And  divalproex (DEPAKOTE ER) 24 hr tablet 500 mg (has no administration in time range)  insulin aspart (novoLOG) injection 5 Units (5 Units Subcutaneous Not Given 07/14/22 1847)  potassium chloride SA (KLOR-CON M) CR tablet 40 mEq (has no administration in time range)  potassium chloride 10 mEq in 100 mL IVPB (has no administration in time range)  sodium  chloride 0.9 % bolus 500 mL (0 mLs Intravenous Stopped 07/14/22 2000)     IMPRESSION / MDM / ASSESSMENT AND PLAN / ED COURSE  I reviewed the triage vital signs and the nursing notes.                             Labs are notably remarkable for mild hyponatremia and a significantly elevated troponin As well as acute kidney injury with creatinine of 2  Differential diagnosis includes, but is not limited to, possible intermittent paroxysms of atrial fibrillation, paroxysmal ventricular tachycardia, recurrent pulmonary embolism though seems somewhat unlikely given the patient's daily use of his Xarelto.  Also causation such as cardiac ischemia, ACS, etc. metabolic toxic etc. are all considered.  Differential diagnosis broad.  The patient does however report he had chest pain earlier today which is now resolved after taking nitrates.  He is resting comfortably but tachycardic sinus tachycardia rate about 110-120  We discussed with cardiology including Dr. LQuentin Ore as well as Dr. GRockey Situ and current plan of care is to have the patient transition between Xarelto to Eliquis at the recommendation of cardiology including Dr. LQuentin Ore  The patient does have AKI, and ruling out recurrent pulmonary embolism will be difficult without jeopardizing potential kidney function his creatinine is greater than 2.  Plan to continue him on anticoagulation, obtain cardiology consult.  Thankfully he is currently asymptomatic at this time.  Cardiology recommends that the patient may need a "reset" of his medications as there seems to be significant confusion as to what he is currently taking based on review of outpatient records and cardiology records.  Patient's presentation is most consistent with acute presentation with potential threat to life or bodily function.   The patient is on the cardiac monitor to evaluate for evidence of arrhythmia and/or significant heart rate changes.   In the setting of acute chest pain that  is now resolved with elevated troponin I am concerned the patient may be suffering from acute coronary syndrome possibly NSTEMI.  He is currently pain-free, so administered 324 mg of aspirin prior to arrival and I have discussed his case with her cardiologist and we will plan to have him continue anticoagulation for which she is planning to bridge from Xarelto to heparin possibly to a different oral anticoagulant as per cardiology recommendations.  Dr. Clayborn Bigness of cardiology will provide consult and lieu of Dr. Yancey Flemings who is currently having his service covered by Dr. Clayborn Bigness     FINAL CLINICAL IMPRESSION(S) / ED DIAGNOSES   Final diagnoses:  Chest pain with high risk for cardiac etiology  NSTEMI (non-ST elevated myocardial infarction) [facility])     Rx / DC Orders   ED Discharge Orders     None        Note:  This document was prepared using Dragon voice recognition software and may include unintentional dictation errors.   Delman Kitten, MD 07/14/22 2233

## 2022-07-14 NOTE — Telephone Encounter (Signed)
Spoke with pharmacy representative  at [facility] in [place] asked that when patient picks up new medications that pharmacist reviewed medicines and when to start and top new medications.

## 2022-07-14 NOTE — ED Triage Notes (Addendum)
Pt arrived via ems from home due to left arm pain and rapid heart rate. Pt states he has an implanted heart device that called him to notify about his HR. Pt given 324 mg aspirin pta. Pt afib-rvr pta. Pt is A&Ox4.

## 2022-07-14 NOTE — ED Notes (Signed)
Informed RN bed assigned 

## 2022-07-14 NOTE — Telephone Encounter (Signed)
Patient called back stating that he went to the ED due to his life alert going off

## 2022-07-15 LAB — COMPREHENSIVE METABOLIC PANEL
ALT: 26 U/L (ref 0–44)
AST: 82 U/L — ABNORMAL HIGH (ref 15–41)
Albumin: 3.3 g/dL — ABNORMAL LOW (ref 3.5–5.0)
Alkaline Phosphatase: 66 U/L (ref 38–126)
Anion gap: 11 (ref 5–15)
BUN: 38 mg/dL — ABNORMAL HIGH (ref 8–23)
CO2: 19 mmol/L — ABNORMAL LOW (ref 22–32)
Calcium: 7.5 mg/dL — ABNORMAL LOW (ref 8.9–10.3)
Chloride: 98 mmol/L (ref 98–111)
Creatinine, Ser: 1.7 mg/dL — ABNORMAL HIGH (ref 0.61–1.24)
GFR, Estimated: 44 mL/min — ABNORMAL LOW (ref >60)
Glucose, Bld: 136 mg/dL — ABNORMAL HIGH (ref 70–99)
Potassium: 3.9 mmol/L (ref 3.5–5.1)
Sodium: 128 mmol/L — ABNORMAL LOW (ref 135–145)
Total Bilirubin: 0.6 mg/dL (ref 0.3–1.2)
Total Protein: 7.1 g/dL (ref 6.5–8.1)

## 2022-07-15 LAB — CBC
HCT: 32.5 % — ABNORMAL LOW (ref 39.0–52.0)
Hemoglobin: 10.5 g/dL — ABNORMAL LOW (ref 13.0–17.0)
MCH: 27.9 pg (ref 26.0–34.0)
MCHC: 32.3 g/dL (ref 30.0–36.0)
MCV: 86.4 fL (ref 80.0–100.0)
Platelets: 117 10*3/uL — ABNORMAL LOW (ref 150–400)
RBC: 3.76 MIL/uL — ABNORMAL LOW (ref 4.22–5.81)
RDW: 15.7 % — ABNORMAL HIGH (ref 11.5–15.5)
WBC: 3.2 10*3/uL — ABNORMAL LOW (ref 4.0–10.5)
nRBC: 0 % (ref 0.0–0.2)

## 2022-07-15 LAB — TROPONIN I (HIGH SENSITIVITY)
Troponin I (High Sensitivity): 19377 ng/L (ref <18)
Troponin I (High Sensitivity): 22213 ng/L (ref <18)
Troponin I (High Sensitivity): 3637 ng/L (ref <18)
Troponin I (High Sensitivity): 6524 ng/L (ref <18)
Troponin I (High Sensitivity): 6919 ng/L (ref <18)

## 2022-07-15 LAB — FOLATE: Folate: 34 ng/mL (ref >5.9)

## 2022-07-15 LAB — IRON AND TIBC
Iron: 27 ug/dL — ABNORMAL LOW (ref 45–182)
Saturation Ratios: 8 % — ABNORMAL LOW (ref 17.9–39.5)
TIBC: 335 ug/dL (ref 250–450)
UIBC: 308 ug/dL

## 2022-07-15 LAB — HEMOGLOBIN A1C
Hgb A1c MFr Bld: 8.4 % — ABNORMAL HIGH (ref 4.8–5.6)
Mean Plasma Glucose: 194.38 mg/dL

## 2022-07-15 LAB — RETICULOCYTES
Immature Retic Fract: 8.6 % (ref 2.3–15.9)
RBC.: 3.79 MIL/uL — ABNORMAL LOW (ref 4.22–5.81)
Retic Count, Absolute: 38.7 10*3/uL (ref 19.0–186.0)
Retic Ct Pct: 1 % (ref 0.4–3.1)

## 2022-07-15 LAB — MAGNESIUM: Magnesium: 1.9 mg/dL (ref 1.7–2.4)

## 2022-07-15 LAB — GLUCOSE, CAPILLARY
Glucose-Capillary: 211 mg/dL — ABNORMAL HIGH (ref 70–99)
Glucose-Capillary: 141 mg/dL — ABNORMAL HIGH (ref 70–99)
Glucose-Capillary: 172 mg/dL — ABNORMAL HIGH (ref 70–99)
Glucose-Capillary: 188 mg/dL — ABNORMAL HIGH (ref 70–99)

## 2022-07-15 LAB — APTT: aPTT: >200 seconds (ref 24–36)

## 2022-07-15 LAB — BRAIN NATRIURETIC PEPTIDE: B Natriuretic Peptide: 398.5 pg/mL — ABNORMAL HIGH (ref 0.0–100.0)

## 2022-07-15 LAB — FERRITIN: Ferritin: 83 ng/mL (ref 24–336)

## 2022-07-15 LAB — PHOSPHORUS: Phosphorus: 3.1 mg/dL (ref 2.5–4.6)

## 2022-07-15 LAB — VITAMIN B12: Vitamin B-12: 275 pg/mL (ref 180–914)

## 2022-07-15 MED ORDER — ISOSORBIDE MONONITRATE ER 30 MG PO TB24
30.0000 mg | ORAL_TABLET | Freq: Every day | ORAL | Status: DC
  Administered 2022-07-15 – 2022-07-16 (×2): 30 mg via ORAL
  Filled 2022-07-15 (×3): qty 1

## 2022-07-15 MED ORDER — ASPIRIN 81 MG PO TBEC
81.0000 mg | DELAYED_RELEASE_TABLET | Freq: Every day | ORAL | Status: DC
  Administered 2022-07-15 – 2022-07-16 (×2): 81 mg via ORAL
  Filled 2022-07-15 (×3): qty 1

## 2022-07-15 MED ORDER — PANTOPRAZOLE SODIUM 40 MG PO TBEC
40.0000 mg | DELAYED_RELEASE_TABLET | Freq: Every day | ORAL | Status: DC
  Administered 2022-07-15 – 2022-07-17 (×3): 40 mg via ORAL
  Filled 2022-07-15 (×3): qty 1

## 2022-07-15 MED ORDER — METOPROLOL SUCCINATE ER 100 MG PO TB24
100.0000 mg | ORAL_TABLET | Freq: Every day | ORAL | Status: DC
  Administered 2022-07-15: 100 mg via ORAL
  Filled 2022-07-15 (×2): qty 1

## 2022-07-15 MED ORDER — HEPARIN (PORCINE) 25000 UT/250ML-% IV SOLN
950.0000 [IU]/h | INTRAVENOUS | Status: DC
  Administered 2022-07-15: 950 [IU]/h via INTRAVENOUS

## 2022-07-15 MED ORDER — CLOPIDOGREL BISULFATE 75 MG PO TABS
75.0000 mg | ORAL_TABLET | Freq: Every day | ORAL | Status: DC
  Administered 2022-07-15 – 2022-07-17 (×3): 75 mg via ORAL
  Filled 2022-07-15 (×3): qty 1

## 2022-07-15 MED ORDER — FUROSEMIDE 20 MG PO TABS
20.0000 mg | ORAL_TABLET | Freq: Every day | ORAL | Status: DC
  Administered 2022-07-15: 20 mg via ORAL
  Filled 2022-07-15: qty 1

## 2022-07-15 MED ORDER — MENTHOL 3 MG MT LOZG
1.0000 | LOZENGE | OROMUCOSAL | Status: DC | PRN
  Filled 2022-07-15: qty 9

## 2022-07-15 MED ORDER — NITROGLYCERIN 0.4 MG SL SUBL: 0.4000 mg | SUBLINGUAL_TABLET | SUBLINGUAL | Status: DC | PRN

## 2022-07-15 MED ORDER — GUAIFENESIN-DM 100-10 MG/5ML PO SYRP
5.0000 mL | ORAL_SOLUTION | ORAL | Status: DC | PRN
  Administered 2022-07-15 – 2022-07-17 (×2): 5 mL via ORAL
  Filled 2022-07-15 (×2): qty 10

## 2022-07-15 MED ORDER — ATORVASTATIN CALCIUM 80 MG PO TABS
80.0000 mg | ORAL_TABLET | Freq: Every day | ORAL | Status: DC
  Administered 2022-07-15 – 2022-07-16 (×2): 80 mg via ORAL
  Filled 2022-07-15 (×2): qty 1

## 2022-07-15 MED ORDER — BENZONATATE 100 MG PO CAPS
200.0000 mg | ORAL_CAPSULE | Freq: Three times a day (TID) | ORAL | Status: DC
  Filled 2022-07-15: qty 2
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
## 2022-07-15 NOTE — Consult Note (Addendum)
ANTICOAGULATION CONSULT NOTE - Initial Consult  Pharmacy Consult for heparin infusion Indication: atrial fibrillation and history of PE  No Known Allergies  Patient Measurements: Height: 5' 3"$  (160 cm) Weight: 54 kg (119 lb 0.8 oz) IBW/kg (Calculated) : 56.9 Heparin Dosing Weight: 54kg  Vital Signs: Temp: 98.7 F (37.1 C) (02/10 1249) Temp Source: Oral (02/10 1249) BP: 105/72 (02/10 1249) Pulse Rate: 105 (02/10 1249)  Labs: Recent Labs    07/14/22 1538 07/14/22 1604 07/14/22 1743 07/14/22 2014 07/15/22 0011 07/15/22 0311 07/15/22 0810 07/15/22 1017 07/15/22 1352  HGB 10.7*  --   --   --   --  10.5*  --   --   --   HCT 33.5*  --   --   --   --  32.5*  --   --   --   PLT 130*  --   --   --   --  117*  --   --   --   APTT  --  54*  --   --   --   --   --   --  >200*  LABPROT  --  25.1*  --   --   --   --   --   --   --   INR  --  2.3*  --   --   --   --   --   --   --   HEPARINUNFRC  --   --   --  >1.10*  --   --   --   --   --   CREATININE 2.04*  --   --  1.99*  --  1.70*  --   --   --   TROPONINIHS  --  1,296*   < > 2,166* 3,637*  --  6,919* 6,524*  --    < > = values in this interval not displayed.     Estimated Creatinine Clearance: 32.2 mL/min (A) (by C-G formula based on SCr of 1.7 mg/dL (H)).   Medical History: Past Medical History:  Diagnosis Date   CAD (coronary artery disease) of artery bypass graft    s/p CABG x 4 in 1997   CHF (congestive heart failure) (HCC)    COPD (chronic obstructive pulmonary disease) (HCC)    Coronary artery disease    CVA (cerebral vascular accident) ([place])    Diabetes mellitus without complication ([place])    Dysrhythmia    GERD (gastroesophageal reflux disease)    HLD (hyperlipidemia)    Hypertension    S/P TAVR (transcatheter aortic valve replacement) 07/05/2021   with Edwards 36m S3UR via TF approach with Dr. CBurt Knackand Dr. BCyndia Bent  Seizures ([facility]     Medications:  PTA: Xarelto 254mdaily with breakfast (last dose  on 2/9 morning)  Inpatient: Heparin infusion (2/10 >>) Allergies: NKDA  Assessment: [age]ear old male with history of PE and atrial fibrillation on xarelto presents to ED with report of left sided chest pain,   Goal of Therapy:  Heparin level 0.3-0.7 units/ml aPTT 66-102 seconds Monitor platelets by anticoagulation protocol: Yes   Date Time aPTT/HL Rate/Comment 2/10 1352 >200/ --- Supratherapeutic / Hold x1 hr, 1100 >  950 u/hr  Plan:  aPTT supratherapeutic. Hold heparin infusion x1 hour and decrease heparin infusion rate to 950 units/hr Check aPTT/Anti-Xa level in 8 hours and daily once consecutively therapeutic.  Titrate by aPTT's until lab correlation is noted, then titrate by anti-xa alone. Continue  to monitor H&H and platelets daily while on heparin gtt.   Darrick Penna Clinical Pharmacist 07/15/2022 3:36 PM
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
## 2022-07-16 LAB — BASIC METABOLIC PANEL
Anion gap: 9 (ref 5–15)
BUN: 41 mg/dL — ABNORMAL HIGH (ref 8–23)
CO2: 22 mmol/L (ref 22–32)
Calcium: 8.2 mg/dL — ABNORMAL LOW (ref 8.9–10.3)
Chloride: 101 mmol/L (ref 98–111)
Creatinine, Ser: 1.64 mg/dL — ABNORMAL HIGH (ref 0.61–1.24)
GFR, Estimated: 46 mL/min — ABNORMAL LOW (ref >60)
Glucose, Bld: 191 mg/dL — ABNORMAL HIGH (ref 70–99)
Potassium: 4.3 mmol/L (ref 3.5–5.1)
Sodium: 132 mmol/L — ABNORMAL LOW (ref 135–145)

## 2022-07-16 LAB — CBC
HCT: 31.6 % — ABNORMAL LOW (ref 39.0–52.0)
Hemoglobin: 10.1 g/dL — ABNORMAL LOW (ref 13.0–17.0)
MCH: 27.5 pg (ref 26.0–34.0)
MCHC: 32 g/dL (ref 30.0–36.0)
MCV: 86.1 fL (ref 80.0–100.0)
Platelets: 116 10*3/uL — ABNORMAL LOW (ref 150–400)
RBC: 3.67 MIL/uL — ABNORMAL LOW (ref 4.22–5.81)
RDW: 15.6 % — ABNORMAL HIGH (ref 11.5–15.5)
WBC: 4.2 10*3/uL (ref 4.0–10.5)
nRBC: 0 % (ref 0.0–0.2)

## 2022-07-16 LAB — MAGNESIUM: Magnesium: 2 mg/dL (ref 1.7–2.4)

## 2022-07-16 LAB — HEPARIN LEVEL (UNFRACTIONATED)
Heparin Unfractionated: 0.7 IU/mL (ref 0.30–0.70)
Heparin Unfractionated: 0.6 IU/mL (ref 0.30–0.70)

## 2022-07-16 LAB — APTT
aPTT: >200 seconds (ref 24–36)
aPTT: 162 seconds (ref 24–36)
aPTT: 133 seconds — ABNORMAL HIGH (ref 24–36)

## 2022-07-16 LAB — GLUCOSE, CAPILLARY
Glucose-Capillary: 124 mg/dL — ABNORMAL HIGH (ref 70–99)
Glucose-Capillary: 133 mg/dL — ABNORMAL HIGH (ref 70–99)
Glucose-Capillary: 141 mg/dL — ABNORMAL HIGH (ref 70–99)
Glucose-Capillary: 184 mg/dL — ABNORMAL HIGH (ref 70–99)
Glucose-Capillary: 226 mg/dL — ABNORMAL HIGH (ref 70–99)

## 2022-07-16 LAB — TROPONIN I (HIGH SENSITIVITY)
Troponin I (High Sensitivity): 11301 ng/L (ref <18)
Troponin I (High Sensitivity): 10143 ng/L (ref <18)

## 2022-07-16 MED ORDER — HEPARIN (PORCINE) 25000 UT/250ML-% IV SOLN
600.0000 [IU]/h | INTRAVENOUS | Status: DC
  Administered 2022-07-16 (×2): 800 [IU]/h via INTRAVENOUS
  Filled 2022-07-16: qty 250

## 2022-07-16 MED ORDER — ASPIRIN 81 MG PO CHEW
81.0000 mg | CHEWABLE_TABLET | ORAL | Status: AC
  Administered 2022-07-17: 81 mg via ORAL
  Filled 2022-07-16: qty 1

## 2022-07-16 MED ORDER — SODIUM CHLORIDE 0.9 % WEIGHT BASED INFUSION
3.0000 mL/kg/h | INTRAVENOUS | Status: AC
  Administered 2022-07-17: 3 mL/kg/h via INTRAVENOUS

## 2022-07-16 MED ORDER — SODIUM CHLORIDE 0.9 % IV SOLN: 250.0000 mL | INTRAVENOUS | Status: DC | PRN

## 2022-07-16 MED ORDER — SODIUM CHLORIDE 0.9% FLUSH
3.0000 mL | Freq: Two times a day (BID) | INTRAVENOUS | Status: DC
  Administered 2022-07-16 (×2): 3 mL via INTRAVENOUS

## 2022-07-16 MED ORDER — SODIUM CHLORIDE 0.9 % WEIGHT BASED INFUSION
1.0000 mL/kg/h | INTRAVENOUS | Status: DC
  Administered 2022-07-17: 1 mL/kg/h via INTRAVENOUS

## 2022-07-16 MED ORDER — SODIUM CHLORIDE 0.9% FLUSH: 3.0000 mL | INTRAVENOUS | Status: DC | PRN

## 2022-07-16 MED ORDER — METOPROLOL SUCCINATE ER 25 MG PO TB24
25.0000 mg | ORAL_TABLET | Freq: Two times a day (BID) | ORAL | Status: DC
  Administered 2022-07-17: 25 mg via ORAL
  Filled 2022-07-16: qty 1

## 2022-07-16 NOTE — Consult Note (Addendum)
ANTICOAGULATION CONSULT NOTE   Pharmacy Consult for heparin infusion Indication: atrial fibrillation and history of PE  No Known Allergies  Patient Measurements: Height: 5' 3"$  (160 cm) Weight: 54 kg (119 lb 0.8 oz) IBW/kg (Calculated) : 56.9 Heparin Dosing Weight: 54kg  Vital Signs: Temp: 97.9 F (36.6 C) (02/11 1220) BP: 103/65 (02/11 1220) Pulse Rate: 89 (02/11 1220)  Labs: Recent Labs     0000 07/14/22 1538 07/14/22 1604 07/14/22 1743 07/14/22 2014 07/15/22 0011 07/15/22 0311 07/15/22 0810 07/15/22 1352 07/15/22 1513 07/15/22 1711 07/16/22 0039 07/16/22 0549 07/16/22 0554 07/16/22 1002 07/16/22 1156  HGB  --  10.7*  --   --   --   --  10.5*  --   --   --   --   --   --  10.1*  --   --   HCT  --  33.5*  --   --   --   --  32.5*  --   --   --   --   --   --  31.6*  --   --   PLT  --  130*  --   --   --   --  117*  --   --   --   --   --   --  116*  --   --   APTT   < >  --  54*  --   --   --   --   --  >200*  --   --  >200*  --   --   --  162*  LABPROT  --   --  25.1*  --   --   --   --   --   --   --   --   --   --   --   --   --   INR  --   --  2.3*  --   --   --   --   --   --   --   --   --   --   --   --   --   HEPARINUNFRC  --   --   --   --  >1.10*  --   --   --   --   --   --   --   --   --   --  0.70  CREATININE  --  2.04*  --   --  1.99*  --  1.70*  --   --   --   --   --   --   --   --  1.64*  TROPONINIHS  --   --  1,296*   < > 2,166*   < >  --    < >  --    < > 22,213*  --  11,301*  --  10,143*  --    < > = values in this interval not displayed.     Estimated Creatinine Clearance: 33.4 mL/min (A) (by C-G formula based on SCr of 1.64 mg/dL (H)).   Medical History: Past Medical History:  Diagnosis Date   CAD (coronary artery disease) of artery bypass graft    s/p CABG x 4 in 1997   CHF (congestive heart failure) (HCC)    COPD (chronic obstructive pulmonary disease) (HCC)    Coronary artery disease    CVA (cerebral vascular accident) ([place])     Diabetes  mellitus without complication (HCC)    Dysrhythmia    GERD (gastroesophageal reflux disease)    HLD (hyperlipidemia)    Hypertension    S/P TAVR (transcatheter aortic valve replacement) 07/05/2021   with Edwards 58m S3UR via TF approach with Dr. CBurt Knackand Dr. BCyndia Bent  Seizures ([facility]     Medications:  PTA: Xarelto 281mdaily with breakfast (last dose on 2/9 morning)  Inpatient: Heparin infusion (2/10 >>) Allergies: NKDA  Assessment: [id number] old male with history of PE and atrial fibrillation on xarelto presents to ED with report of left sided chest pain,   2/11 11:56 aPTT 162. HL 0.7  Goal of Therapy:  Heparin level 0.3-0.7 units/ml aPTT 66-102 seconds Monitor platelets by anticoagulation protocol: Yes   Date Time aPTT/HL Rate/Comment 2/10 1352 >200/ --- Supratherapeutic / Hold x1 hr, 1100 >  950 u/hr 2/11 0039 >200/ ---  Supratherapeutic/ 950 > 800 u/hr 2/11 1156 162/ 0.70 Supratherapeutic/ 800 > 750 u/hr 2/11 1958 133/ 0.60 Supratherapeutic/ 750 > 700 u/hr  Plan:  aPTT remains supratherapeutic despite therapeutic heparin level. Decrease heparin infusion to 700 units/hr Check aPTT/Anti-Xa level in 6 hours and daily once consecutively therapeutic.  Titrate by aPTT's until lab correlation is noted, then titrate by anti-xa alone. Continue to monitor H&H and platelets daily while on heparin gtt.   [place]harmacist 07/16/2022 9:24 PM

## 2022-07-16 NOTE — Consult Note (Signed)
ANTICOAGULATION CONSULT NOTE - Initial Consult  Pharmacy Consult for heparin infusion Indication: atrial fibrillation and history of PE  No Known Allergies  Patient Measurements: Height: 5' 3"$  (160 cm) Weight: 54 kg (119 lb 0.8 oz) IBW/kg (Calculated) : 56.9 Heparin Dosing Weight: 54kg  Vital Signs: Temp: 97.8 F (36.6 C) (02/11 0000) Temp Source: Oral (02/11 0000) BP: 96/50 (02/11 0000) Pulse Rate: 70 (02/11 0000)  Labs: Recent Labs    07/14/22 1538 07/14/22 1604 07/14/22 1743 07/14/22 2014 07/15/22 0011 07/15/22 0311 07/15/22 0810 07/15/22 1017 07/15/22 1352 07/15/22 1513 07/15/22 1711 07/16/22 0039  HGB 10.7*  --   --   --   --  10.5*  --   --   --   --   --   --   HCT 33.5*  --   --   --   --  32.5*  --   --   --   --   --   --   PLT 130*  --   --   --   --  117*  --   --   --   --   --   --   APTT  --  54*  --   --   --   --   --   --  >200*  --   --  >200*  LABPROT  --  25.1*  --   --   --   --   --   --   --   --   --   --   INR  --  2.3*  --   --   --   --   --   --   --   --   --   --   HEPARINUNFRC  --   --   --  >1.10*  --   --   --   --   --   --   --   --   CREATININE 2.04*  --   --  1.99*  --  1.70*  --   --   --   --   --   --   TROPONINIHS  --  1,296*   < > 2,166*   < >  --    < > 6,524*  --  19,377* 22,213*  --    < > = values in this interval not displayed.     Estimated Creatinine Clearance: 32.2 mL/min (A) (by C-G formula based on SCr of 1.7 mg/dL (H)).   Medical History: Past Medical History:  Diagnosis Date   CAD (coronary artery disease) of artery bypass graft    s/p CABG x 4 in 1997   CHF (congestive heart failure) (HCC)    COPD (chronic obstructive pulmonary disease) (HCC)    Coronary artery disease    CVA (cerebral vascular accident) ([place])    Diabetes mellitus without complication ([place])    Dysrhythmia    GERD (gastroesophageal reflux disease)    HLD (hyperlipidemia)    Hypertension    S/P TAVR (transcatheter aortic valve  replacement) 07/05/2021   with Edwards 60m S3UR via TF approach with Dr. CBurt Knackand Dr. BCyndia Bent  Seizures ([facility]     Medications:  PTA: Xarelto 228mdaily with breakfast (last dose on 2/9 morning)  Inpatient: Heparin infusion (2/10 >>) Allergies: NKDA  Assessment: [id number] old male with history of PE and atrial fibrillation on xarelto presents to ED with report of  left sided chest pain,   Goal of Therapy:  Heparin level 0.3-0.7 units/ml aPTT 66-102 seconds Monitor platelets by anticoagulation protocol: Yes   Date Time aPTT/HL Rate/Comment 2/10 1352 >200/ --- Supratherapeutic / Hold x1 hr, 1100 >  950 u/hr 2/11 0039 >200/ ---  Supratherapeutic  Plan:  Contacted RN. Hold heparin infusion x1 hour and decrease heparin infusion rate to 800 units/hr Check aPTT/Anti-Xa level in 8 hours after restart.  Titrate by aPTT's until lab correlation is noted, then titrate by anti-xa alone. Continue to monitor H&H and platelets daily while on heparin gtt.  Renda Rolls, PharmD, [facility] 07/16/2022 2:11 AM

## 2022-07-16 NOTE — Progress Notes (Signed)
[facility]    SUBJECTIVE: States to be doing reasonably well no chest pain no significant shortness of breath resting comfortably   Vitals:   07/16/22 0000 07/16/22 0230 07/16/22 0541 07/16/22 0834  BP: (!) 96/50 (!) 109/55 106/62 (!) 100/53  Pulse: 70 82 74 73  Resp: 16 17 18 16  $ Temp: 97.8 F (36.6 C) 97.8 F (36.6 C) 98.6 F (37 C) 97.7 F (36.5 C)  TempSrc: Oral     SpO2: 95% 99% 99% 99%  Weight:      Height:         Intake/Output Summary (Last 24 hours) at 07/16/2022 1043 Last data filed at 07/16/2022 0309 Gross per 24 hour  Intake 556.01 ml  Output 750 ml  Net -193.99 ml      PHYSICAL EXAM  General: Well developed, well nourished, in no acute distress HEENT:  Normocephalic and atramatic Neck:  No JVD.  Lungs: Clear bilaterally to auscultation and percussion. Heart: HRRR . Normal S1 and S2 without gallops or murmurs.  Abdomen: Bowel sounds are positive, abdomen soft and non-tender  Msk:  Back normal, normal gait. Normal strength and tone for age. Extremities: No clubbing, cyanosis or edema.   Neuro: Alert and oriented X 3. Psych:  Good affect, responds appropriately   LABS: Basic Metabolic Panel: Recent Labs    07/14/22 2014 07/15/22 0311  NA 129* 128*  K 3.2* 3.9  CL 97* 98  CO2 18* 19*  GLUCOSE 196* 136*  BUN 42* 38*  CREATININE 1.99* 1.70*  CALCIUM 7.5* 7.5*  MG 1.9 1.9  PHOS 3.6 3.1   Liver Function Tests: Recent Labs    07/14/22 2014 07/15/22 0311  AST 49* 82*  ALT 17 26  ALKPHOS 69 66  BILITOT 0.7 0.6  PROT 7.3 7.1  ALBUMIN 3.3* 3.3*   No results for input(s): "LIPASE", "AMYLASE" in the last 72 hours. CBC: Recent Labs    07/15/22 0311 07/16/22 0554  WBC 3.2* 4.2  HGB 10.5* 10.1*  HCT 32.5* 31.6*  MCV 86.4 86.1  PLT 117* 116*   Cardiac Enzymes: No results for input(s): "CKTOTAL", "CKMB", "CKMBINDEX", "TROPONINI" in the last 72 hours. BNP: Invalid input(s): "POCBNP" D-Dimer: No results for input(s): "DDIMER" in  the last 72 hours. Hemoglobin A1C: Recent Labs    07/15/22 1017  HGBA1C 8.4*   Fasting Lipid Panel: No results for input(s): "CHOL", "HDL", "LDLCALC", "TRIG", "CHOLHDL", "LDLDIRECT" in the last 72 hours. Thyroid Function Tests: No results for input(s): "TSH", "T4TOTAL", "T3FREE", "THYROIDAB" in the last 72 hours.  Invalid input(s): "FREET3" Anemia Panel: Recent Labs    07/15/22 0311  VITAMINB12 275  FOLATE 34.0  FERRITIN 83  TIBC 335  IRON 27*  RETICCTPCT 1.0    DG Chest 2 View  Result Date: 07/14/2022 CLINICAL DATA:  afib rvr EXAM: CHEST - 2 VIEW COMPARISON:  05/26/2022 FINDINGS: Previous coronary bypass changes, TAVR, cardiac recorder noted. Stable mild cardiomegaly. Previous CHF pattern has resolved. Similar basilar atelectasis/scarring. No new collapse, consolidation, current CHF or effusion. Aorta atherosclerotic. Trachea midline. Bones are osteopenic and degenerative changes noted spine. No acute compression fracture appreciated. IMPRESSION: 1. Cardiomegaly with basilar scarring/atelectasis. 2. No interval change or acute process by plain radiography. Electronically Signed   By: Jerilynn Mages.  Shick M.D.   On: 07/14/2022 16:31     Echo mild reduced left ventricular function EF between 40 and 45% well-seated prosthetic TAVR valve  TELEMETRY: NSR 83 nsstw:  ASSESSMENT AND PLAN:  Principal Problem:  Non-STEMI (non-ST elevated myocardial infarction) ([place]) Active Problems:   Controlled type 2 diabetes mellitus with stage 3 chronic kidney disease, without long-term current use of insulin (HCC)   Essential hypertension   History of stroke   Sinus tachycardia   History of seizure   Hx of pulmonary embolus   Long term (current) use of anticoagulants   Coronary artery disease with hx of myocardial infarct w/o hx of CABG   S/P TAVR (transcatheter aortic valve replacement)   Paroxysmal atrial fibrillation (HCC)   Chronic combined systolic and diastolic CHF (congestive heart failure)  (HCC)   AKI (acute kidney injury) (HCC)   High anion gap metabolic acidosis   Diabetic ketoacidosis without coma associated with type 2 diabetes mellitus ([place])    Plan Non STEMI unstable angina peak troponins 22,000 continue heparin consider invasive cardiac cath Multivessel coronary disease including coronary bypass surgery continue current medical therapy History of TAVR previously well-seated valve with normal function continue current medical therapy Diabetes type 2 uncomplicated continue medical therapy Atrial fibrillation on Eliquis will hold continue heparin for now for anticoagulation Continue Plavix imdur ramipril Lipitor metoprolol Diabetes type 2 uncomplicated continue Trulicity Farxiga Consider cardiac cath in the morning with Dr. Silvestre Gunner, MD 07/16/2022 10:43 AM
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
## 2022-07-16 NOTE — Progress Notes (Addendum)
Plan:  RN contacted. Hold heparin infusion x1 hour and decrease heparin infusion rate to 800 units/hr Check aPTT/Anti-Xa level in 8 hours after restart.  Titrate by aPTT's until lab correlation is noted, then titrate by anti-xa alone. Continue to monitor H&H and platelets daily while on heparin gtt.  Pt is alert and oriented color pale. RN followed orders listed above. Vitals updated in Mar. Pt reassessed for acute changes. No acute changes noted at this time.    Heparin restarted and titrated to 68m/hr from 9.5108mhr.

## 2022-07-17 ENCOUNTER — Encounter: Payer: Self-pay | Admitting: Cardiovascular Disease

## 2022-07-17 ENCOUNTER — Other Ambulatory Visit (HOSPITAL_COMMUNITY): Payer: Self-pay

## 2022-07-17 ENCOUNTER — Encounter
Admission: EM | Disposition: A | Discharge status: Home Health | Payer: Self-pay | Source: Home / Self Care | Attending: Internal Medicine

## 2022-07-17 DIAGNOSIS — I214 Non-ST elevation (NSTEMI) myocardial infarction: Secondary | ICD-10-CM

## 2022-07-17 DIAGNOSIS — I5042 Chronic combined systolic (congestive) and diastolic (congestive) heart failure: Secondary | ICD-10-CM

## 2022-07-17 DIAGNOSIS — I2510 Atherosclerotic heart disease of native coronary artery without angina pectoris: Secondary | ICD-10-CM

## 2022-07-17 HISTORY — PX: LEFT HEART CATH AND CORS/GRAFTS ANGIOGRAPHY: CATH118250

## 2022-07-17 LAB — CBC WITH DIFFERENTIAL/PLATELET
Abs Immature Granulocytes: 0.01 10*3/uL (ref 0.00–0.07)
Basophils Absolute: 0 10*3/uL (ref 0.0–0.1)
Basophils Relative: 0 %
Eosinophils Absolute: 0 10*3/uL (ref 0.0–0.5)
Eosinophils Relative: 0 %
HCT: 28.8 % — ABNORMAL LOW (ref 39.0–52.0)
Hemoglobin: 9.4 g/dL — ABNORMAL LOW (ref 13.0–17.0)
Immature Granulocytes: 0 %
Lymphocytes Relative: 20 %
Lymphs Abs: 0.5 10*3/uL — ABNORMAL LOW (ref 0.7–4.0)
MCH: 28.1 pg (ref 26.0–34.0)
MCHC: 32.6 g/dL (ref 30.0–36.0)
MCV: 86 fL (ref 80.0–100.0)
Monocytes Absolute: 0.1 10*3/uL (ref 0.1–1.0)
Monocytes Relative: 6 %
Neutro Abs: 1.9 10*3/uL (ref 1.7–7.7)
Neutrophils Relative %: 74 %
Platelets: 108 10*3/uL — ABNORMAL LOW (ref 150–400)
RBC: 3.35 MIL/uL — ABNORMAL LOW (ref 4.22–5.81)
RDW: 15.4 % (ref 11.5–15.5)
WBC: 2.5 10*3/uL — ABNORMAL LOW (ref 4.0–10.5)
nRBC: 0 % (ref 0.0–0.2)

## 2022-07-17 LAB — GLUCOSE, CAPILLARY
Glucose-Capillary: 164 mg/dL — ABNORMAL HIGH (ref 70–99)
Glucose-Capillary: 178 mg/dL — ABNORMAL HIGH (ref 70–99)
Glucose-Capillary: 143 mg/dL — ABNORMAL HIGH (ref 70–99)
Glucose-Capillary: 136 mg/dL — ABNORMAL HIGH (ref 70–99)
Glucose-Capillary: 253 mg/dL — ABNORMAL HIGH (ref 70–99)
Glucose-Capillary: 237 mg/dL — ABNORMAL HIGH (ref 70–99)

## 2022-07-17 LAB — BASIC METABOLIC PANEL
Anion gap: 10 (ref 5–15)
BUN: 34 mg/dL — ABNORMAL HIGH (ref 8–23)
CO2: 22 mmol/L (ref 22–32)
Calcium: 8.2 mg/dL — ABNORMAL LOW (ref 8.9–10.3)
Chloride: 104 mmol/L (ref 98–111)
Creatinine, Ser: 1.38 mg/dL — ABNORMAL HIGH (ref 0.61–1.24)
GFR, Estimated: 56 mL/min — ABNORMAL LOW (ref >60)
Glucose, Bld: 138 mg/dL — ABNORMAL HIGH (ref 70–99)
Potassium: 4.2 mmol/L (ref 3.5–5.1)
Sodium: 136 mmol/L (ref 135–145)

## 2022-07-17 LAB — CBC
HCT: 31.4 % — ABNORMAL LOW (ref 39.0–52.0)
Hemoglobin: 10.1 g/dL — ABNORMAL LOW (ref 13.0–17.0)
MCH: 27.7 pg (ref 26.0–34.0)
MCHC: 32.2 g/dL (ref 30.0–36.0)
MCV: 86 fL (ref 80.0–100.0)
Platelets: 109 10*3/uL — ABNORMAL LOW (ref 150–400)
RBC: 3.65 MIL/uL — ABNORMAL LOW (ref 4.22–5.81)
RDW: 15.4 % (ref 11.5–15.5)
WBC: 3.2 10*3/uL — ABNORMAL LOW (ref 4.0–10.5)
nRBC: 0 % (ref 0.0–0.2)

## 2022-07-17 LAB — APTT
aPTT: 126 seconds — ABNORMAL HIGH (ref 24–36)
aPTT: 39 seconds — ABNORMAL HIGH (ref 24–36)

## 2022-07-17 SURGERY — LEFT HEART CATH AND CORS/GRAFTS ANGIOGRAPHY
Anesthesia: Moderate Sedation

## 2022-07-17 MED ORDER — TICAGRELOR 90 MG PO TABS: 90.0000 mg | ORAL_TABLET | Freq: Two times a day (BID) | ORAL | Status: DC

## 2022-07-17 MED ORDER — TICAGRELOR 90 MG PO TABS: 90.0000 mg | ORAL_TABLET | Freq: Two times a day (BID) | ORAL | 0 refills | Status: AC

## 2022-07-17 MED ORDER — FENTANYL CITRATE (PF) 100 MCG/2ML IJ SOLN
INTRAMUSCULAR | Status: DC | PRN
  Administered 2022-07-17: 25 ug via INTRAVENOUS

## 2022-07-17 MED ORDER — LABETALOL HCL 5 MG/ML IV SOLN: 10.0000 mg | INTRAVENOUS | Status: DC | PRN

## 2022-07-17 MED ORDER — SODIUM CHLORIDE 0.9% FLUSH: 3.0000 mL | INTRAVENOUS | Status: DC | PRN

## 2022-07-17 MED ORDER — SODIUM CHLORIDE 0.9 % IV SOLN: 250.0000 mL | INTRAVENOUS | Status: DC | PRN

## 2022-07-17 MED ORDER — HYDRALAZINE HCL 20 MG/ML IJ SOLN: 10.0000 mg | INTRAMUSCULAR | Status: DC | PRN

## 2022-07-17 MED ORDER — ONDANSETRON HCL 4 MG/2ML IJ SOLN: 4.0000 mg | Freq: Four times a day (QID) | INTRAMUSCULAR | Status: DC | PRN

## 2022-07-17 MED ORDER — HEPARIN (PORCINE) IN NACL 2000-0.9 UNIT/L-% IV SOLN
INTRAVENOUS | Status: DC | PRN
  Administered 2022-07-17 (×2): 1000 mL

## 2022-07-17 MED ORDER — SODIUM CHLORIDE 0.9 % WEIGHT BASED INFUSION
1.0000 mL/kg/h | INTRAVENOUS | Status: DC
  Administered 2022-07-17: 1 mL/kg/h via INTRAVENOUS

## 2022-07-17 MED ORDER — FENTANYL CITRATE (PF) 100 MCG/2ML IJ SOLN
INTRAMUSCULAR | Status: AC
  Filled 2022-07-17: qty 2

## 2022-07-17 MED ORDER — MIDAZOLAM HCL 2 MG/2ML IJ SOLN
INTRAMUSCULAR | Status: AC
  Filled 2022-07-17: qty 2

## 2022-07-17 MED ORDER — MIDAZOLAM HCL 2 MG/2ML IJ SOLN
INTRAMUSCULAR | Status: DC | PRN
  Administered 2022-07-17: 1 mg via INTRAVENOUS

## 2022-07-17 MED ORDER — ACETAMINOPHEN 325 MG PO TABS: 650.0000 mg | ORAL_TABLET | ORAL | Status: DC | PRN

## 2022-07-17 MED ORDER — SODIUM CHLORIDE 0.9% FLUSH: 3.0000 mL | Freq: Two times a day (BID) | INTRAVENOUS | Status: DC

## 2022-07-17 MED ORDER — APIXABAN 5 MG PO TABS: 5.0000 mg | ORAL_TABLET | Freq: Two times a day (BID) | ORAL | Status: DC

## 2022-07-17 MED ORDER — IOHEXOL 300 MG/ML  SOLN
INTRAMUSCULAR | Status: DC | PRN
  Administered 2022-07-17: 84 mL

## 2022-07-17 SURGICAL SUPPLY — 14 items
CATH ANGIO 5F JB2 100CM (CATHETERS) IMPLANT
CATH INFINITI 5 FR 3DRC (CATHETERS) IMPLANT
CATH INFINITI 5 FR IM (CATHETERS) IMPLANT
CATH INFINITI 5FR MULTPACK ANG (CATHETERS) IMPLANT
DEVICE CLOSURE MYNXGRIP 5F (Vascular Products) IMPLANT
NDL PERC 18GX7CM (NEEDLE) IMPLANT
NEEDLE PERC 18GX7CM (NEEDLE) ×1 IMPLANT
PACK CARDIAC CATH (CUSTOM PROCEDURE TRAY) ×2 IMPLANT
PROTECTION STATION PRESSURIZED (MISCELLANEOUS) ×1
SET ATX-X65L (MISCELLANEOUS) IMPLANT
SHEATH AVANTI 5FR X 11CM (SHEATH) IMPLANT
STATION PROTECTION PRESSURIZED (MISCELLANEOUS) IMPLANT
WIRE EMERALD 3MM-J .035X260CM (WIRE) IMPLANT
WIRE GUIDERIGHT .035X150 (WIRE) IMPLANT

## 2022-07-17 NOTE — Consult Note (Addendum)
   Heart Failure Nurse Navigator Note  HFrEF 40 to 45% left ventricle is moderately dilated.  Moderate LVH.  Grade 3 diastolic dysfunction.  Right ventricular systolic function is mildly reduced.  Mild mitral regurgitation.  Mild aortic insufficiency.  He presented to the emergency room after he was notified by his doctor's office that he was having a fast heart rate.  He also noted pain in his left upper arm upon awakening. BNP was 398.  Troponin peaked at greater than 22,000.   Comorbidities:   Atrial fibrillation on NOAC Coronary artery disease status post CABG CVA Seizure disorder Diabetes Hypertension  Medications:  Atorvastatin 80 mg at bedtime Isosorbide mononitrate 30 mg daily Metoprolol succinate 25 mg 2 times a day Eliquis 5 mg 2 times a day Brilinta 90 mg 2 times a day  Labs:  Sodium 132, potassium 4.3, chloride 101, CO2 22, BUN 41, creatinine 1.64, GFR greater than 56. Weight is 69.1 kg Intake 490 mL Output 400 mL Blood pressure 114/74   Initial meeting with patient, he is sitting up in the bed currently eating his lunch.  He states that he lives at home by himself.  He has a scale but does not weigh himself on a daily basis.  Explained the reasoning behind daily weights recording and reporting early 2 pound weight gain overnight or 5 pounds total within the week also to report changes in symptoms such as increasing shortness of breath, lower extremity edema, increasing abdominal girth PND and orthopnea.  He voices understanding.  Discussed his medication and asked if he has any financial difficulties with obtaining his medications.  Patient states no that his and medications are covered by insurance and he is very compliant with taking them daily except for recent episode where he had one that had no refills and he was unsure what to do.  He could not recall the medications name.  States that he has difficulty remembering those names since his stroke.  Made aware  if he has questions or unsure about his medications to please call the outpatient heart failure clinic as they could help with medications for his heart that needed to be refilled.  Also discussed difficulty with transportation.  Reminded patient that he has Medicare/Medicaid and if he looks on the back of his card there is a number to call for transportation but that he needs to make arrangements 3 days prior to his appointments for the transportation.  He was given info sheet on obtaining transportation through his insurance.  He states that he may be going home later this afternoon.  Made aware that he has a follow-up appointment in the outpatient heart failure clinic on February 21 at 11:30 in the morning.  He had no further questions.   Pricilla Riffle RN [facility]

## 2022-07-17 NOTE — Care Management Important Message (Signed)
Important Message  Patient Details  Name: Thomas Moore MRN: [id number] Date of Birth: Aug 05, 1955   Medicare Important Message Given:  Yes     Dannette Barbara 07/17/2022, 2:24 PM

## 2022-07-17 NOTE — Consult Note (Signed)
[place] for heparin infusion Indication: atrial fibrillation and history of PE  No Known Allergies  Patient Measurements: Height: 5' 3"$  (160 cm) Weight: 54 kg (119 lb 0.8 oz) IBW/kg (Calculated) : 56.9 Heparin Dosing Weight: 54kg  Vital Signs: Temp: 98.2 F (36.8 C) (02/12 0402) Temp Source: Oral (02/12 0402) BP: 132/69 (02/12 0402) Pulse Rate: 94 (02/12 0402)  Labs: Recent Labs    07/14/22 1604 07/14/22 1743 07/14/22 2014 07/15/22 0011 07/15/22 0311 07/15/22 0810 07/15/22 1711 07/16/22 0039 07/16/22 0549 07/16/22 0554 07/16/22 1002 07/16/22 1156 07/16/22 1958 07/17/22 0348  HGB  --   --   --   --  10.5*  --   --   --   --  10.1*  --   --   --  10.1*  HCT  --   --   --   --  32.5*  --   --   --   --  31.6*  --   --   --  31.4*  PLT  --   --   --   --  117*  --   --   --   --  116*  --   --   --  109*  APTT 54*  --   --   --   --    < >  --    < >  --   --   --  162* 133* 126*  LABPROT 25.1*  --   --   --   --   --   --   --   --   --   --   --   --   --   INR 2.3*  --   --   --   --   --   --   --   --   --   --   --   --   --   HEPARINUNFRC  --   --  >1.10*  --   --   --   --   --   --   --   --  0.70 0.60  --   CREATININE  --   --  1.99*  --  1.70*  --   --   --   --   --   --  1.64*  --   --   TROPONINIHS 1,296*   < > 2,166*   < >  --    < > 22,213*  --  11,301*  --  10,143*  --   --   --    < > = values in this interval not displayed.     Estimated Creatinine Clearance: 33.4 mL/min (A) (by C-G formula based on SCr of 1.64 mg/dL (H)).   Medical History: Past Medical History:  Diagnosis Date   CAD (coronary artery disease) of artery bypass graft    s/p CABG x 4 in 1997   CHF (congestive heart failure) (HCC)    COPD (chronic obstructive pulmonary disease) (HCC)    Coronary artery disease    CVA (cerebral vascular accident) ([place])    Diabetes mellitus without complication ([place])    Dysrhythmia    GERD  (gastroesophageal reflux disease)    HLD (hyperlipidemia)    Hypertension    S/P TAVR (transcatheter aortic valve replacement) 07/05/2021   with Edwards 74m S3UR via TF approach with Dr. CBurt Knackand Dr. BCyndia Bent  Seizures ([facility]  Medications:  PTA: Xarelto 35m daily with breakfast (last dose on 2/9 morning)  Inpatient: Heparin infusion (2/10 >>) Allergies: NKDA  Assessment: 67year old male with history of PE and atrial fibrillation on xarelto presents to ED with report of left sided chest pain,   2/11 11:56 aPTT 162. HL 0.7  Goal of Therapy:  Heparin level 0.3-0.7 units/ml aPTT 66-102 seconds Monitor platelets by anticoagulation protocol: Yes   Date Time aPTT/HL Rate/Comment 2/10 1352 >200/ --- Supratherapeutic / Hold x1 hr, 1100 >  950 u/hr 2/11 0039 >200/ ---  Supratherapeutic/ 950 > 800 u/hr 2/11 1156 162/ 0.70 Supratherapeutic/ 800 > 750 u/hr 2/11 1958 133/ 0.60 Supratherapeutic/ 750 > 700 u/hr 2/12 0348 126  Supratherapeutic   Plan:  Decrease heparin infusion rate to 600 units/hr Recheck aPTT/HL level in 6 hours after rate change  Titrate by aPTT's until lab correlation is noted, then titrate by HL alone. Continue to monitor H&H and platelets daily while on heparin gtt.  NRenda Rolls PharmD, [facility]2/05/2023 5:38 AM

## 2022-07-17 NOTE — TOC Transition Note (Signed)
Transition of Care [facility]) - CM/SW Discharge Note   Patient Details  Name: Thomas Moore MRN: [id number] Date of Birth: [birth year]  [facility] CM/SW Contact:  Tiburcio Bash, LCSW Phone Number: 07/17/2022, 3:09 PM   Clinical Narrative:     Patient to dc home today, refused Home Health however is agreeable to RW. Reports he has a ride he will call at discharge. RW to be delivered to hospital room via [employer], no further dc needs.   Final next level of care: Home/Self Care Barriers to Discharge: No Barriers Identified   Patient Goals and CMS Choice CMS Medicare.gov Compare Post Acute Care list provided to:: Patient Choice offered to / list presented to : Patient  Discharge Placement                         Discharge Plan and Services Additional resources added to the After Visit Summary for                                       Social Determinants of Health (SDOH) Interventions SDOH Screenings   Food Insecurity: No Food Insecurity (07/14/2022)  Housing: Low Risk  (07/14/2022)  Transportation Needs: No Transportation Needs (07/14/2022)  Recent Concern: Transportation Needs - Unmet Transportation Needs (05/27/2022)  Utilities: Not At Risk (07/14/2022)  Stress: No Stress Concern Present (02/19/2020)  Tobacco Use: Medium Risk (07/17/2022)     Readmission Risk Interventions    07/07/2021   10:40 AM  Readmission Risk Prevention Plan  Post Dischage Appt Complete  Medication Screening Complete  Transportation Screening Complete

## 2022-07-17 NOTE — Evaluation (Signed)
Physical Therapy Evaluation Patient Details Name: Thomas Moore MRN: [id number] DOB: 1955-12-08 Today's Date: 07/17/2022  History of Present Illness  presented to ER secondary to elevated HR, L UE pain; admitted for management of NSTEMI (peak troponin 22, 213).  S/p cardiac cath significant for occlusion of saphenous vein graft to RCA and native R coronary artery occlusion; recommended for medical management  Clinical Impression  Patient resting in bed upon arrival to room; alert and oriented, follows commands and agreeable to participation with session. Eager for mobilization and progression towards discharge home; focused on timing and notification of his transportation.  Denies pain at rest and with exertion throughout session. Bilat LE/LE strength and ROM grossly symmetrical and WFL; no focal weakness appreciated.  Able to complete bed mobility with indep; sit/stand, basic transfers and gait (50') without assist device, cga/min assist.  Demonstrates reciprocal stepping with inconsistent step height/length, mod sway with head turns and changes of direction; LOB x2 requiring min assist for recovery  Additional gait trial (210') with RW, cga/close sup with reciprocal stepping pattern with improved mechanics, overall safety and stability. Do recommend continued use of RW at this time; patient voiced understanding and agreement. Vitals stable and WFL at rest and with exertion (HR 80-90s at rest; low-100s with gait); denies chest pain/pressure/palpitations.  R femoral site clean and dry beginning/end of session Would benefit from skilled PT to address above deficits and promote optimal return to PLOF.; Recommend transition to HHPT upon discharge from acute hospitalization.      Recommendations for follow up therapy are one component of a multi-disciplinary discharge planning process, led by the attending physician.  Recommendations may be updated based on patient status, additional functional criteria  and insurance authorization.  Follow Up Recommendations Home health PT      Assistance Recommended at Discharge    Patient can return home with the following  A little help with walking and/or transfers;A little help with bathing/dressing/bathroom    Equipment Recommendations Rolling walker (2 wheels)  Recommendations for Other Services       Functional Status Assessment Patient has had a recent decline in their functional status and demonstrates the ability to make significant improvements in function in a reasonable and predictable amount of time.     Precautions / Restrictions Precautions Precautions: Fall Restrictions Weight Bearing Restrictions: No      Mobility  Bed Mobility Overal bed mobility: Independent                  Transfers Overall transfer level: Needs assistance Equipment used: None Transfers: Sit to/from Stand Sit to Stand: Supervision, Min guard                Ambulation/Gait Ambulation/Gait assistance: Min guard, Min assist Gait Distance (Feet): 50 Feet Assistive device: None         General Gait Details: reciprocal stepping with inconsistent step height/length, mod sway with head turns and changes of direction; LOB x2 requiring min assist for recovery  Stairs            Wheelchair Mobility    Modified Rankin (Stroke Patients Only)       Balance Overall balance assessment: Needs assistance Sitting-balance support: No upper extremity supported, Feet supported Sitting balance-Leahy Scale: Good     Standing balance support: No upper extremity supported Standing balance-Leahy Scale: Fair  Pertinent Vitals/Pain Pain Assessment Pain Assessment: No/denies pain    Home Living Family/patient expects to be discharged to:: Private residence Living Arrangements: Alone Available Help at Discharge: Family;Friend(s);Available PRN/intermittently Type of Home: Apartment Home  Access: Level entry       Home Layout: One level Home Equipment: None      Prior Function Prior Level of Function : Independent/Modified Independent             Mobility Comments: Indep with ADLs, household mobilization without assist device; denies fall history. ADLs Comments: Friends/family assist with iADLs, driving/community errands.     Hand Dominance   Dominant Hand: Right    Extremity/Trunk Assessment   Upper Extremity Assessment Upper Extremity Assessment: Overall WFL for tasks assessed    Lower Extremity Assessment Lower Extremity Assessment: Overall WFL for tasks assessed       Communication   Communication: HOH  Cognition Arousal/Alertness: Awake/alert Behavior During Therapy: WFL for tasks assessed/performed Overall Cognitive Status: Within Functional Limits for tasks assessed                                          General Comments      Exercises Other Exercises Other Exercises: 37' with RW, close sup-reciprocal stepping pattern with improved mechanics, overall safety and stability. Do recommend continued use of RW at this time; patient voiced understanding and agreement.   Assessment/Plan    PT Assessment Patient needs continued PT services  PT Problem List Decreased balance;Decreased mobility;Decreased coordination;Cardiopulmonary status limiting activity       PT Treatment Interventions DME instruction;Gait training;Therapeutic activities;Cognitive remediation;Patient/family education;Functional mobility training;Therapeutic exercise;Balance training    PT Goals (Current goals can be found in the Care Plan section)  Acute Rehab PT Goals Patient Stated Goal: to return home PT Goal Formulation: With patient Time For Goal Achievement: 07/31/22 Potential to Achieve Goals: Good    Frequency Min 2X/week     Co-evaluation               AM-PAC PT "6 Clicks" Mobility  Outcome Measure Help needed turning from your  back to your side while in a flat bed without using bedrails?: None Help needed moving from lying on your back to sitting on the side of a flat bed without using bedrails?: None Help needed moving to and from a bed to a chair (including a wheelchair)?: None Help needed standing up from a chair using your arms (e.g., wheelchair or bedside chair)?: None Help needed to walk in hospital room?: A Little Help needed climbing 3-5 steps with a railing? : A Little 6 Click Score: 22    End of Session Equipment Utilized During Treatment: Gait belt Activity Tolerance: Patient tolerated treatment well Patient left: in chair;with call bell/phone within reach;with chair alarm set Nurse Communication: Mobility status PT Visit Diagnosis: Difficulty in walking, not elsewhere classified (R26.2)    Time: [id number] PT Time Calculation (min) (ACUTE ONLY): 23 min   Charges:   PT Evaluation $PT Eval Moderate Complexity: 1 Mod PT Treatments $Gait Training: 8-22 mins       Jahnyla Parrillo H. Owens Shark, PT, DPT, NCS 07/17/22, 4:08 PM [phone]

## 2022-07-17 NOTE — Evaluation (Signed)
Occupational Therapy Evaluation Patient Details Name: Thomas Moore MRN: [id number] DOB: May 21, 1956 Today's Date: 07/17/2022   History of Present Illness presented to ER secondary to elevated HR, L UE pain; admitted for management of NSTEMI (peak troponin 22, 213).  S/p cardiac cath significant for occlusion of saphenous vein graft to RCA and native R coronary artery occlusion; recommended for medical management   Clinical Impression   Patient presenting with decreased independence in self care, balance, functional mobility/transfers, and endurance. PTA pt lived alone, was independent for ADLs/IADLs, and independent for functional mobility without an AD. Pt currently functioning at supervision for STS from recliner, [place] guard for functional mobility to the bathroom + RW, supervision for toilet transfer, Min guard for clothing management/peri care in standing, and set up-supervision for sinkside grooming. Pt denied any chest pain and VSS t/o session. Pt will benefit from acute OT to increase overall independence in the areas of ADLs and functional mobility in order to safely discharge home. Pt could benefit from [facility] following D/C to decrease falls risk, improve balance, and maximize independence in self-care within own home environment.     Recommendations for follow up therapy are one component of a multi-disciplinary discharge planning process, led by the attending physician.  Recommendations may be updated based on patient status, additional functional criteria and insurance authorization.   Follow Up Recommendations  Home health OT     Assistance Recommended at Discharge Intermittent Supervision/Assistance  Patient can return home with the following A little help with walking and/or transfers;A little help with bathing/dressing/bathroom;Assistance with cooking/housework;Assist for transportation;Help with stairs or ramp for entrance    Functional Status Assessment  Patient has had a recent  decline in their functional status and demonstrates the ability to make significant improvements in function in a reasonable and predictable amount of time.  Equipment Recommendations  None recommended by OT    Recommendations for Other Services       Precautions / Restrictions Precautions Precautions: Fall Restrictions Weight Bearing Restrictions: No      Mobility Bed Mobility               General bed mobility comments: NT, received/left in recliner    Transfers Overall transfer level: Needs assistance Equipment used: None Transfers: Sit to/from Stand Sit to Stand: Supervision, Min guard                  Balance Overall balance assessment: Needs assistance Sitting-balance support: No upper extremity supported, Feet supported Sitting balance-Leahy Scale: Good     Standing balance support: No upper extremity supported Standing balance-Leahy Scale: Fair                             ADL either performed or assessed with clinical judgement   ADL Overall ADL's : Needs assistance/impaired     Grooming: Supervision/safety;Standing;Set up           Upper Body Dressing : Set up;Sitting   Lower Body Dressing: Set up;Min guard;Sit to/from stand;Sitting/lateral leans Lower Body Dressing Details (indicate cue type and reason): set up A in sitting for socks, Min guard in standing for safety Toilet Transfer: Regular Toilet;Grab bars;Rolling walker (2 wheels);Supervision/safety   Toileting- Water quality scientist and Hygiene: Min guard;Sit to/from stand Toileting - Clothing Manipulation Details (indicate cue type and reason): for peri care in standing     Functional mobility during ADLs: Min guard;Rolling walker (2 wheels) (~61f to the bathroom)  Vision Baseline Vision/History: 1 Wears glasses Patient Visual Report: No change from baseline       Perception     Praxis      Pertinent Vitals/Pain Pain Assessment Pain Assessment:  No/denies pain     Hand Dominance Right   Extremity/Trunk Assessment Upper Extremity Assessment Upper Extremity Assessment: Overall WFL for tasks assessed   Lower Extremity Assessment Lower Extremity Assessment: Overall WFL for tasks assessed       Communication Communication Communication: HOH   Cognition Arousal/Alertness: Awake/alert Behavior During Therapy: WFL for tasks assessed/performed Overall Cognitive Status: Within Functional Limits for tasks assessed                                       General Comments       Exercises Other Exercises Other Exercises: OT provided education re: role of OT, OT POC, post acute recs, sitting up for all meals, EOB/OOB mobility with assistance, home/fall safety.     Shoulder Instructions      Home Living Family/patient expects to be discharged to:: Private residence Living Arrangements: Alone Available Help at Discharge: Family;Friend(s);Available PRN/intermittently Type of Home: Apartment Home Access: Level entry     Home Layout: One level     Bathroom Shower/Tub: Teacher, early years/pre: Standard     Home Equipment: Grab bars - toilet;Grab bars - tub/shower          Prior Functioning/Environment Prior Level of Function : Independent/Modified Independent             Mobility Comments: IND for household mobilization without assist device; denies fall history. ADLs Comments: Pt reports IND for ADLs. Friends/family assist with IADLs, driving/community errands.        OT Problem List: Decreased activity tolerance;Impaired balance (sitting and/or standing);Cardiopulmonary status limiting activity      OT Treatment/Interventions: Self-care/ADL training;Therapeutic exercise;Therapeutic activities;Energy conservation;DME and/or AE instruction;Patient/family education;Balance training    OT Goals(Current goals can be found in the care plan section) Acute Rehab OT Goals Patient Stated  Goal: return home OT Goal Formulation: With patient Time For Goal Achievement: 07/31/22 Potential to Achieve Goals: Good   OT Frequency: Min 2X/week    Co-evaluation              AM-PAC OT "6 Clicks" Daily Activity     Outcome Measure Help from another person eating meals?: None Help from another person taking care of personal grooming?: None Help from another person toileting, which includes using toliet, bedpan, or urinal?: A Little Help from another person bathing (including washing, rinsing, drying)?: A Little Help from another person to put on and taking off regular upper body clothing?: None Help from another person to put on and taking off regular lower body clothing?: A Little 6 Click Score: 21   End of Session Equipment Utilized During Treatment: Gait belt;Rolling walker (2 wheels) Nurse Communication: Mobility status  Activity Tolerance: Patient tolerated treatment well Patient left: in chair;with call bell/phone within reach;with chair alarm set  OT Visit Diagnosis: Other abnormalities of gait and mobility (R26.89)                Time: [id number] OT Time Calculation (min): 15 min Charges:  OT General Charges $OT Visit: 1 Visit OT Evaluation $OT Eval Moderate Complexity: 1 Mod  [facility] MS, OTR/L ascom [phone]  07/17/22, 5:04 PM

## 2022-07-17 NOTE — Progress Notes (Addendum)
SUBJECTIVE: Patient has some left arm pain but no chest pain   Vitals:   07/17/22 1023 07/17/22 1037 07/17/22 1045 07/17/22 1100  BP: 115/73 108/67 105/69 (!) 100/57  Pulse: 94 88 81 72  Resp: (!) 23 (!) 21 (!) 23 (!) 21  Temp:  97.7 F (36.5 C)    TempSrc:  Temporal    SpO2: 97% 94% 94% 92%  Weight:      Height:        Intake/Output Summary (Last 24 hours) at 07/17/2022 1108 Last data filed at 07/16/2022 2126 Gross per 24 hour  Intake 390 ml  Output 400 ml  Net -10 ml    LABS: Basic Metabolic Panel: Recent Labs    07/14/22 2014 07/15/22 0311 07/16/22 1156  NA 129* 128* 132*  K 3.2* 3.9 4.3  CL 97* 98 101  CO2 18* 19* 22  GLUCOSE 196* 136* 191*  BUN 42* 38* 41*  CREATININE 1.99* 1.70* 1.64*  CALCIUM 7.5* 7.5* 8.2*  MG 1.9 1.9 2.0  PHOS 3.6 3.1  --    Liver Function Tests: Recent Labs    07/14/22 2014 07/15/22 0311  AST 49* 82*  ALT 17 26  ALKPHOS 69 66  BILITOT 0.7 0.6  PROT 7.3 7.1  ALBUMIN 3.3* 3.3*   No results for input(s): "LIPASE", "AMYLASE" in the last 72 hours. CBC: Recent Labs    07/16/22 0554 07/17/22 0348  WBC 4.2 3.2*  HGB 10.1* 10.1*  HCT 31.6* 31.4*  MCV 86.1 86.0  PLT 116* 109*   Cardiac Enzymes: No results for input(s): "CKTOTAL", "CKMB", "CKMBINDEX", "TROPONINI" in the last 72 hours. BNP: Invalid input(s): "POCBNP" D-Dimer: No results for input(s): "DDIMER" in the last 72 hours. Hemoglobin A1C: Recent Labs    07/15/22 1017  HGBA1C 8.4*   Fasting Lipid Panel: No results for input(s): "CHOL", "HDL", "LDLCALC", "TRIG", "CHOLHDL", "LDLDIRECT" in the last 72 hours. Thyroid Function Tests: No results for input(s): "TSH", "T4TOTAL", "T3FREE", "THYROIDAB" in the last 72 hours.  Invalid input(s): "FREET3" Anemia Panel: Recent Labs    07/15/22 0311  VITAMINB12 275  FOLATE 34.0  FERRITIN 83  TIBC 335  IRON 27*  RETICCTPCT 1.0     PHYSICAL EXAM General: Well developed, well nourished, in no acute distress HEENT:   Normocephalic and atramatic Neck:  No JVD.  Lungs: Clear bilaterally to auscultation and percussion. Heart: HRRR . Normal S1 and S2 without gallops or murmurs.  Abdomen: Bowel sounds are positive, abdomen soft and non-tender  Msk:  Back normal, normal gait. Normal strength and tone for age. Extremities: No clubbing, cyanosis or edema.   Neuro: Alert and oriented X 3. Psych:  Good affect, responds appropriately  TELEMETRY: Sinus rhythm  ASSESSMENT AND PLAN: Non-STEMI due to occlusion of the saphenous vein graft to the right coronary.  Native right coronary is already occluded.  LIMA to the LAD and SVG to OM1 branch of circumflex are patent.  LV gram was not done due to prior history of TAVR.  Advise medical therapy with changing Plavix to Brilinta.  Advise adding nitrates and Ranexa. Will plan to do LV systolic evaluation on echo as an outpatient. Will continue to follow the patient.     ICD-10-CM   1. Chest pain with high risk for cardiac etiology  R07.9     2. NSTEMI (non-ST elevated myocardial infarction) ([place])  I21.4       Principal Problem:   Non-STEMI (non-ST elevated myocardial infarction) ([place]) Active Problems:  Controlled type 2 diabetes mellitus with stage 3 chronic kidney disease, without long-term current use of insulin (HCC)   Essential hypertension   History of stroke   Sinus tachycardia   History of seizure   Hx of pulmonary embolus   Long term (current) use of anticoagulants   Coronary artery disease with hx of myocardial infarct w/o hx of CABG   S/P TAVR (transcatheter aortic valve replacement)   Paroxysmal atrial fibrillation (HCC)   Chronic combined systolic and diastolic CHF (congestive heart failure) (HCC)   AKI (acute kidney injury) ([place])   High anion gap metabolic acidosis   Diabetic ketoacidosis without coma associated with type 2 diabetes mellitus ([place])    Neoma Laming, MD, [facility] 07/17/2022 11:08 AM

## 2022-07-17 NOTE — Progress Notes (Signed)
Paged Dr Humphrey Rolls about Heparin Drip, states wants to keep Heparin drip off for now, did not restart heparin.

## 2022-07-17 NOTE — Care Management Important Message (Signed)
Important Message  Patient Details  Name: Thomas Moore MRN: [id number] Date of Birth: 05-15-1956   Medicare Important Message Given:  Yes     Dannette Barbara 07/17/2022, 3:00 PM

## 2022-07-17 NOTE — Progress Notes (Signed)
Pt has gotten in touch with his ride and says his ride is on the way.
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
## 2022-07-21 LAB — CUP PACEART REMOTE DEVICE CHECK
Date Time Interrogation Session: 20240214231006
Implantable Pulse Generator Implant Date: 20210503

## 2022-07-24 ENCOUNTER — Ambulatory Visit (INDEPENDENT_AMBULATORY_CARE_PROVIDER_SITE_OTHER): Payer: 59 | Admitting: Cardiovascular Disease

## 2022-07-24 ENCOUNTER — Encounter: Payer: Self-pay | Admitting: Cardiovascular Disease

## 2022-07-24 ENCOUNTER — Ambulatory Visit: Payer: 59

## 2022-07-24 VITALS — BP 110/70 | HR 76 | Ht 63.0 in | Wt 140.6 lb

## 2022-07-24 DIAGNOSIS — R0789 Other chest pain: Secondary | ICD-10-CM

## 2022-07-24 DIAGNOSIS — I5043 Acute on chronic combined systolic (congestive) and diastolic (congestive) heart failure: Secondary | ICD-10-CM | POA: Diagnosis not present

## 2022-07-24 DIAGNOSIS — I2510 Atherosclerotic heart disease of native coronary artery without angina pectoris: Secondary | ICD-10-CM

## 2022-07-24 DIAGNOSIS — I5022 Chronic systolic (congestive) heart failure: Secondary | ICD-10-CM

## 2022-07-24 DIAGNOSIS — I214 Non-ST elevation (NSTEMI) myocardial infarction: Secondary | ICD-10-CM

## 2022-07-24 DIAGNOSIS — R55 Syncope and collapse: Secondary | ICD-10-CM | POA: Diagnosis not present

## 2022-07-24 DIAGNOSIS — I252 Old myocardial infarction: Secondary | ICD-10-CM

## 2022-07-24 DIAGNOSIS — I6523 Occlusion and stenosis of bilateral carotid arteries: Secondary | ICD-10-CM

## 2022-07-24 DIAGNOSIS — I480 Paroxysmal atrial fibrillation: Secondary | ICD-10-CM

## 2022-07-24 NOTE — Assessment & Plan Note (Signed)
Chronic pain left arm, after medical treatment feeling better

## 2022-07-24 NOTE — Progress Notes (Addendum)
Cardiology Office Note   Date:  08/28/2022   ID:  Thomas Moore, DOB 12-Feb-1956, MRN [id number]  PCP:  Thomas Ruths, MD  Cardiologist:  Thomas Laming, MD      History of Present Illness: Thomas Moore is a 67 y.o. male who presents for  Chief Complaint  Patient presents with   Follow-up    1 mo follow up    Chest Pain  This is a recurrent (more of left arm pain) problem. The current episode started 1 to 4 weeks ago. The pain is present in the substernal region. The pain is at a severity of 4/10. The pain radiates to the left arm.      Past Medical History:  Diagnosis Date   CAD (coronary artery disease) of artery bypass graft    s/p CABG x 4 in 1997   CHF (congestive heart failure) (HCC)    COPD (chronic obstructive pulmonary disease) (HCC)    Coronary artery disease    CVA (cerebral vascular accident) ([place])    Diabetes mellitus without complication ([place])    Dysrhythmia    GERD (gastroesophageal reflux disease)    HLD (hyperlipidemia)    Hypertension    S/P TAVR (transcatheter aortic valve replacement) 07/05/2021   with Edwards 66mm S3UR via TF approach with Dr. Burt Knack and Dr. Cyndia Bent   Seizures [facility])      Past Surgical History:  Procedure Laterality Date   BYPASS GRAFT ANGIOGRAPHY N/A 04/25/2021   Procedure: BYPASS GRAFT ANGIOGRAPHY;  Surgeon: Thomas Grime, MD;  Location: [place] [facility];  Service: Cardiovascular;  Laterality: N/A;   CARDIAC SURGERY     CORNEAL TRANSPLANT Right    CORONARY ARTERY BYPASS GRAFT  1997   ELECTROPHYSIOLOGY STUDY N/A 09/08/2021   Procedure: ELECTROPHYSIOLOGY STUDY;  Surgeon: Thomas Epley, MD;  Location: [place] [facility];  Service: Cardiovascular;  Laterality: N/A;   EYE SURGERY     FLEXOR TENDON REPAIR Left 07/11/2019   Procedure: FLEXOR tenolysis  REPAIR LEFT RING FINGER with tednon repair;  Surgeon: Thomas Knows, MD;  Location: [facility];  Service: Orthopedics;  Laterality: Left;   INCISION AND DRAINAGE  ABSCESS Left 05/08/2019   Procedure: INCISION AND DRAINAGE ABSCESS;  Surgeon: Thomas Leep, MD;  Location: [facility];  Service: Orthopedics;  Laterality: Left;   INTRAOPERATIVE TRANSTHORACIC ECHOCARDIOGRAM N/A 07/05/2021   Procedure: INTRAOPERATIVE TRANSTHORACIC ECHOCARDIOGRAM;  Surgeon: Thomas Mocha, MD;  Location: [place];  Service: Open Heart Surgery;  Laterality: N/A;   LEFT HEART CATH AND CORONARY ANGIOGRAPHY Left 06/09/2022   Procedure: LEFT HEART CATH AND CORONARY ANGIOGRAPHY;  Surgeon: Thomas David, MD;  Location: [place] [facility];  Service: Cardiovascular;  Laterality: Left;   LEFT HEART CATH AND CORS/GRAFTS ANGIOGRAPHY N/A 10/02/2019   Procedure: LEFT HEART CATH AND CORS/GRAFTS ANGIOGRAPHY;  Surgeon: Thomas Man, MD;  Location: [place] [facility];  Service: Cardiovascular;  Laterality: N/A;   LEFT HEART CATH AND CORS/GRAFTS ANGIOGRAPHY N/A 07/17/2022   Procedure: LEFT HEART CATH AND CORS/GRAFTS ANGIOGRAPHY and possible PCI and stent;  Surgeon: Thomas David, MD;  Location: [place] [facility];  Service: Cardiovascular;  Laterality: N/A;   LOOP RECORDER INSERTION N/A 10/06/2019   Procedure: LOOP RECORDER INSERTION;  Surgeon: Thomas Lance, MD;  Location: [place] [facility];  Service: Cardiovascular;  Laterality: N/A;   TRANSCATHETER AORTIC VALVE REPLACEMENT, TRANSFEMORAL N/A 07/05/2021   Procedure: TRANSCATHETER AORTIC VALVE REPLACEMENT, TRANSFEMORAL;  Surgeon: Thomas Mocha, MD;  Location: [facility];  Service: Open Heart Surgery;  Laterality: N/A;   TRIGGER FINGER RELEASE Left      Current Outpatient Medications  Medication Sig Dispense Refill   acetaminophen (TYLENOL) 650 MG CR tablet Take 1,300 mg by mouth every 8 (eight) hours as needed for pain.     apixaban (ELIQUIS) 5 MG TABS tablet Take 1 tablet (5 mg total) by mouth 2 (two) times daily. 60 tablet 3   Calcium Citrate-Vitamin D (CALCIUM + D PO) Take 1 tablet by mouth 2 (two) times daily.     furosemide (LASIX) 20  MG tablet Take 1 tablet (20 mg total) by mouth daily. 30 tablet 0   isosorbide mononitrate (IMDUR) 30 MG 24 hr tablet Take 30 mg by mouth daily.     metoprolol succinate (TOPROL-XL) 100 MG 24 hr tablet Take 1 tablet (100 mg total) by mouth daily. 30 tablet 3   Multiple Vitamin (MULTIVITAMIN) capsule Take 1 capsule by mouth daily.     nitroGLYCERIN (NITROSTAT) 0.4 MG SL tablet Place 0.4 mg under the tongue every 5 (five) minutes as needed for chest pain.     omeprazole (PRILOSEC) 40 MG capsule Take 40 mg by mouth daily.     pantoprazole (PROTONIX) 40 MG tablet Take 40 mg by mouth daily.     ramipril (ALTACE) 2.5 MG capsule Take 1 capsule (2.5 mg total) by mouth daily. 30 capsule 0   amoxicillin (AMOXIL) 500 MG tablet Take 2,000 mg by mouth See admin instructions. Take 4 tablets (2000mg ) by mouth 1 hour prior to dental procedures (Patient not taking: Reported on 07/24/2022)     atorvastatin (LIPITOR) 80 MG tablet Take 1 tablet (80 mg total) by mouth at bedtime. 30 tablet 1   dapagliflozin propanediol (FARXIGA) 10 MG TABS tablet Take 1 tablet (10 mg total) by mouth daily before breakfast. 30 tablet 5   divalproex (DEPAKOTE ER) 250 MG 24 hr tablet Take 250 mg by mouth at bedtime.     TRULICITY [id number] SOPN Inject 0.75 mg into the skin once a week. Wednesday (Patient taking differently: Inject 4.5 mg into the skin once a week. Tuesday) 1 mL 4   No current facility-administered medications for this visit.   Facility-Administered Medications Ordered in Other Visits  Medication Dose Route Frequency Provider Last Rate Last Admin   sodium chloride flush (NS) 0.9 % injection 3 mL  3 mL Intravenous Q12H Scoggins, Amber, NP       sodium chloride flush (NS) 0.9 % injection 3 mL  3 mL Intravenous Q12H Scoggins, Amber, NP        Allergies:   Patient has no known allergies.    Social History:   reports that he quit smoking about 40 years ago. His smoking use included cigarettes. He has never used  smokeless tobacco. He reports that he does not currently use alcohol. He reports that he does not use drugs.   Family History:  family history includes Seizures in his brother.    ROS:     Review of Systems  Constitutional: Negative.   HENT: Negative.    Eyes: Negative.   Respiratory: Negative.    Cardiovascular:  Positive for chest pain.  Gastrointestinal: Negative.   Genitourinary: Negative.   Musculoskeletal: Negative.   Skin: Negative.   Neurological: Negative.   Endo/Heme/Allergies: Negative.   Psychiatric/Behavioral: Negative.    All other systems reviewed and are negative.     All other systems are reviewed and negative.  PHYSICAL EXAM: VS:  BP 110/70   Pulse 76   Ht 5\' 3"  (1.6 m)   Wt 140 lb 9.6 oz (63.8 kg)   SpO2 98%   BMI 24.91 kg/m  , BMI Body mass index is 24.91 kg/m. Last weight:  Wt Readings from Last 3 Encounters:  08/22/22 138 lb 14.2 oz (63 kg)  07/24/22 140 lb 9.6 oz (63.8 kg)  07/17/22 136 lb 6.4 oz (61.9 kg)     Physical Exam Vitals reviewed.  Constitutional:      Appearance: Normal appearance. He is normal weight.  HENT:     Head: Normocephalic.     Nose: Nose normal.     Mouth/Throat:     Mouth: Mucous membranes are moist.  Eyes:     Pupils: Pupils are equal, round, and reactive to light.  Cardiovascular:     Rate and Rhythm: Normal rate and regular rhythm.     Pulses: Normal pulses.     Heart sounds: Normal heart sounds.  Pulmonary:     Effort: Pulmonary effort is normal.  Abdominal:     General: Abdomen is flat. Bowel sounds are normal.  Musculoskeletal:        General: Normal range of motion.     Cervical back: Normal range of motion.  Skin:    General: Skin is warm.  Neurological:     General: No focal deficit present.     Mental Status: He is alert.  Psychiatric:        Mood and Affect: Mood normal.      EKG: none today  Recent Labs: 07/15/2022: ALT 26; B Natriuretic Peptide 398.5 07/16/2022: Magnesium  2.0 08/22/2022: BUN 26; Creatinine, Ser 1.45; Hemoglobin 9.7; Platelets 170; Potassium 4.7; Sodium 137    Lipid Panel    Component Value Date/Time   CHOL 141 05/27/2022 0520   CHOL 143 02/25/2020 1126   TRIG 159 (H) 05/27/2022 0520   HDL 32 (L) 05/27/2022 0520   HDL 31 (L) 02/25/2020 1126   CHOLHDL 4.4 05/27/2022 0520   VLDL 32 05/27/2022 0520   LDLCALC 77 05/27/2022 0520   LDLCALC 78 02/25/2020 1126     ASSESSMENT AND PLAN:    ICD-10-CM   1. Other chest pain  R07.89     2. Acute on chronic combined systolic and diastolic CHF (congestive heart failure) (HCC)  I50.43     3. Bilateral carotid artery occlusion  I65.23     4. Coronary artery disease with hx of myocardial infarct w/o hx of CABG  I25.10    I25.2     5. Non-ST elevation (NSTEMI) myocardial infarction ([place])  I21.4     6. Paroxysmal atrial fibrillation (HCC)  I48.0        Problem List Items Addressed This Visit       Cardiovascular and Mediastinum   Bilateral carotid artery disease (HCC)   Non-ST elevation (NSTEMI) myocardial infarction [facility])   Acute on chronic combined systolic and diastolic CHF (congestive heart failure) ([place])    Left ventricular ejection fraction, by estimation, is 40 to 45% on echo 05/2022. On appropriate therapy, Altace, Lasix, Toprol, and [place].       Coronary artery disease with hx of myocardial infarct w/o hx of CABG    Chronic pain left arm, after medical treatment feeling better      Paroxysmal atrial fibrillation (HCC)     Other   Chest pain - Primary       Disposition:   Return  in about 4 weeks (around 08/21/2022).    Total time spent:30 minutes  Signed,  Thomas Laming, MD  08/28/2022 1:33 PM    [facility]

## 2022-07-25 ENCOUNTER — Encounter: Payer: 59 | Admitting: Family

## 2022-07-25 NOTE — Progress Notes (Signed)
Patient ID: MOIZ BIRMAN, male   DOB: September 02, 1955, 67 y.o.   MRN: [id number]  [facility] COUNSELING NOTE  Guideline-Directed Medical Therapy/Evidence Based Medicine  ACE/ARB/ARNI:  ramipril 2.69m daily Beta Blocker: Metoprolol succinate 200 mg daily Aldosterone Antagonist:  none Diuretic: Furosemide 20 mg daily SGLT2i:  none Glp1: Trulicity [id number] weekly (Wednesdays)  Adherence Assessment  Do you ever forget to take your medication? []$ Yes [x]$ No  Do you ever skip doses due to side effects? []$ Yes [x]$ No  Do you have trouble affording your medicines? []$ Yes [x]$ No  Are you ever unable to pick up your medication due to transportation difficulties? []$ Yes [x]$ No  Do you ever stop taking your medications because you don't believe they are helping? []$ Yes [x]$ No  Do you check your weight daily? []$ Yes [x]$ No   Adherence strategy: none  Barriers to obtaining medications: none  Vital signs: HR 65, BP 131/62, weight (pounds) 149 lbs  ECHO: 05/27/2022,  EF of 40-45% along with moderate LVH, severe LAE, mild MR/ AR with TAVR valve.      Latest Ref Rng & Units 07/17/2022   12:13 PM 07/16/2022   11:56 AM 07/15/2022    3:11 AM  BMP  Glucose 70 - 99 mg/dL 138  191  136   BUN 8 - 23 mg/dL 34  41  38   Creatinine 0.61 - 1.24 mg/dL 1.38  1.64  1.70   Sodium 135 - 145 mmol/L 136  132  128   Potassium 3.5 - 5.1 mmol/L 4.2  4.3  3.9   Chloride 98 - 111 mmol/L 104  101  98   CO2 22 - 32 mmol/L 22  22  19   $ Calcium 8.9 - 10.3 mg/dL 8.2  8.2  7.5     Past Medical History:  Diagnosis Date   CAD (coronary artery disease) of artery bypass graft    s/p CABG x 4 in 1997   CHF (congestive heart failure) (HCC)    COPD (chronic obstructive pulmonary disease) (HCC)    Coronary artery disease    CVA (cerebral vascular accident) ([place]    Diabetes mellitus without complication ([place]    Dysrhythmia    GERD (gastroesophageal reflux disease)     HLD (hyperlipidemia)    Hypertension    S/P TAVR (transcatheter aortic valve replacement) 07/05/2021   with Edwards 274mS3UR via TF approach with Dr. CoBurt Knacknd Dr[facility]    ASSESSMENT [age]ear old male who presents to the HF clinic for initial visit. PMH includes CAD, DM, hyperlipidemia, HTN, stroke, COPD, GERD, TAVR, seizures, previous tobacco use and chronic heart failure. Lasr acite care admission was 05/26/22 for chest pain.   MedRec completed during visit. No issues with current therapy or ADR reported by patient.  PLAN Start SGLT21 today and repeat BMP 2-3 weeks (A1C 8.7%) Plan to add spironolactone during next OV if renal function, BP and electrolytes remain stable.   Time spent: 20 minutes  Tequia Wolman Rodriguez-Guzman PharmD, BCPS 07/25/2022 11:09 AM    Current Outpatient Medications:    acetaminophen (TYLENOL) 650 MG CR tablet, Take 1,300 mg by mouth every 8 (eight) hours as needed for pain., Disp: , Rfl:    amoxicillin (AMOXIL) 500 MG tablet, Take 2,000 mg by mouth See admin instructions. Take 4 tablets (200035mby mouth 1 hour prior to dental procedures (Patient not taking: Reported on 07/24/2022), Disp: , Rfl:    apixaban (ELIQUIS)  5 MG TABS tablet, Take 1 tablet (5 mg total) by mouth 2 (two) times daily., Disp: 60 tablet, Rfl: 3   atorvastatin (LIPITOR) 80 MG tablet, Take 1 tablet (80 mg total) by mouth at bedtime., Disp: 30 tablet, Rfl: 1   Calcium Citrate-Vitamin D (CALCIUM + D PO), Take 1 tablet by mouth 2 (two) times daily., Disp: , Rfl:    dapagliflozin propanediol (FARXIGA) 10 MG TABS tablet, Take 1 tablet (10 mg total) by mouth daily before breakfast., Disp: 30 tablet, Rfl: 5   divalproex (DEPAKOTE ER) 250 MG 24 hr tablet, Take 250 mg by mouth at bedtime., Disp: , Rfl:    furosemide (LASIX) 20 MG tablet, Take 1 tablet (20 mg total) by mouth daily., Disp: 30 tablet, Rfl: 0   isosorbide mononitrate (IMDUR) 30 MG 24 hr tablet, Take 30 mg by mouth  daily., Disp: , Rfl:    metoprolol succinate (TOPROL-XL) 100 MG 24 hr tablet, Take 1 tablet (100 mg total) by mouth daily., Disp: 30 tablet, Rfl: 3   Multiple Vitamin (MULTIVITAMIN) capsule, Take 1 capsule by mouth daily., Disp: , Rfl:    nitroGLYCERIN (NITROSTAT) 0.4 MG SL tablet, Place 0.4 mg under the tongue every 5 (five) minutes as needed for chest pain., Disp: , Rfl:    omeprazole (PRILOSEC) 40 MG capsule, Take 40 mg by mouth daily., Disp: , Rfl:    pantoprazole (PROTONIX) 40 MG tablet, Take 40 mg by mouth daily., Disp: , Rfl:    ramipril (ALTACE) 2.5 MG capsule, Take 1 capsule (2.5 mg total) by mouth daily., Disp: 30 capsule, Rfl: 0   ticagrelor (BRILINTA) 90 MG TABS tablet, Take 1 tablet (90 mg total) by mouth 2 (two) times daily., Disp: 60 tablet, Rfl: 0   TRULICITY [id number] SOPN, Inject 0.75 mg into the skin once a week. Wednesday (Patient taking differently: Inject 4.5 mg into the skin once a week. Tuesday), Disp: 1 mL, Rfl: 4 No current facility-administered medications for this visit.  Facility-Administered Medications Ordered in Other Visits:    sodium chloride flush (NS) 0.9 % injection 3 mL, 3 mL, Intravenous, Q12H, Scoggins, Amber, NP   sodium chloride flush (NS) 0.9 % injection 3 mL, 3 mL, Intravenous, Q12H, Scoggins, Amber, NP   MEDICATION ADHERENCES TIPS AND STRATEGIES Taking medication as prescribed improves patient outcomes in heart failure (reduces hospitalizations, improves symptoms, increases survival) Side effects of medications can be managed by decreasing doses, switching agents, stopping drugs, or adding additional therapy. Please let someone in the [facility] know if you have having bothersome side effects so we can modify your regimen. Do not alter your medication regimen without talking to [place].  Medication reminders can help patients remember to take drugs on time. If you are missing or forgetting doses you can try linking behaviors, using pill boxes,  or an electronic reminder like an alarm on your phone or an app. Some people can also get automated phone calls as medication reminders.

## 2022-07-26 ENCOUNTER — Encounter: Payer: 59 | Admitting: Family

## 2022-07-27 NOTE — Progress Notes (Signed)
Carelink Summary Report / Loop Recorder 

## 2022-08-01 NOTE — Progress Notes (Deleted)
  Electrophysiology Office Follow up Visit Note:    Date:  08/01/2022   ID:  Thomas Moore, DOB 03/12/1956, MRN [id number]  PCP:  Kirk Ruths, MD  [facility] Cardiologist:  Teodoro Spray, MD  [facility] Electrophysiologist:  Vickie Epley, MD    Interval History:    Thomas Moore is a 67 y.o. male who presents for a follow up visit.   Last seen 11/23/2021 for syncope. Had a prior EP study which was negative for inducible arrhythmic. ILR was placed. ILR showed nonsustained VT. Had [facility] 07/17/2022. SVG-RCA occluded and medical therapy was recommended.  He has a ILR in situ. AF has been detected and he has been started on eliquis for stroke ppx. ILR has shown NSVT.      Past medical, surgical, social and family history were reviewed.  ROS:   Please see the history of present illness.    All other systems reviewed and are negative.  EKGs/Labs/Other Studies Reviewed:    The following studies were reviewed today:     Physical Exam:    VS:  There were no vitals taken for this visit.    Wt Readings from Last 3 Encounters:  07/24/22 140 lb 9.6 oz (63.8 kg)  07/17/22 136 lb 6.4 oz (61.9 kg)  06/28/22 149 lb (67.6 kg)     GEN: *** Well nourished, well developed in no acute distress CARDIAC: ***RRR, no murmurs, rubs, gallops RESPIRATORY:  Clear to auscultation without rales, wheezing or rhonchi       ASSESSMENT:    No diagnosis found. PLAN:    In order of problems listed above:  #Paroxysmal AF Low burden on ILR. Cont eliquis  #NSVT On ILR. Asymptomatic. No sustained VT inducible on prior EP study Given LV function, CAD I would like ot start amiodarone for both his NSVT and AF.  Start amiodarone 459m PO BID x 5 days followed by 4046mPO daily x 5 days followed by 20025mO daily.  #HFrEF [id number] recent Echo NYHA II. I would like ot get a cardiac MRI to assess for LGE burden and reassess EF. If EF <35, consider ICD.  Follow up after  cMR.    Signed, CamLars MageD, FACResearch Psychiatric CenterHRBayhealth [facility]27/2024 10:07 PM    Electrophysiology [facility]

## 2022-08-02 ENCOUNTER — Ambulatory Visit: Payer: 59 | Attending: Cardiology | Admitting: Cardiology

## 2022-08-02 DIAGNOSIS — I472 Ventricular tachycardia, unspecified: Secondary | ICD-10-CM

## 2022-08-02 DIAGNOSIS — I5022 Chronic systolic (congestive) heart failure: Secondary | ICD-10-CM

## 2022-08-02 DIAGNOSIS — I480 Paroxysmal atrial fibrillation: Secondary | ICD-10-CM

## 2022-08-11 ENCOUNTER — Other Ambulatory Visit: Payer: Self-pay

## 2022-08-11 MED ORDER — ATORVASTATIN CALCIUM 80 MG PO TABS: 80.0000 mg | ORAL_TABLET | Freq: Every day | ORAL | 1 refills | Status: DC

## 2022-08-22 ENCOUNTER — Encounter: Payer: Self-pay | Admitting: *Deleted

## 2022-08-22 ENCOUNTER — Other Ambulatory Visit: Payer: Self-pay

## 2022-08-22 ENCOUNTER — Emergency Department
Admission: EM | Admit: 2022-08-22 | Discharge: 2022-08-22 | Disposition: A | Discharge status: Home / Self Care | Payer: Medicare HMO | Attending: Emergency Medicine | Admitting: Emergency Medicine

## 2022-08-22 ENCOUNTER — Emergency Department: Payer: Medicare HMO

## 2022-08-22 DIAGNOSIS — Z7901 Long term (current) use of anticoagulants: Secondary | ICD-10-CM | POA: Insufficient documentation

## 2022-08-22 DIAGNOSIS — I110 Hypertensive heart disease with heart failure: Secondary | ICD-10-CM | POA: Diagnosis not present

## 2022-08-22 DIAGNOSIS — J449 Chronic obstructive pulmonary disease, unspecified: Secondary | ICD-10-CM | POA: Insufficient documentation

## 2022-08-22 DIAGNOSIS — Z8673 Personal history of transient ischemic attack (TIA), and cerebral infarction without residual deficits: Secondary | ICD-10-CM | POA: Diagnosis not present

## 2022-08-22 DIAGNOSIS — J9811 Atelectasis: Secondary | ICD-10-CM | POA: Diagnosis not present

## 2022-08-22 DIAGNOSIS — Z951 Presence of aortocoronary bypass graft: Secondary | ICD-10-CM | POA: Insufficient documentation

## 2022-08-22 DIAGNOSIS — I2510 Atherosclerotic heart disease of native coronary artery without angina pectoris: Secondary | ICD-10-CM | POA: Diagnosis not present

## 2022-08-22 DIAGNOSIS — I509 Heart failure, unspecified: Secondary | ICD-10-CM | POA: Diagnosis not present

## 2022-08-22 DIAGNOSIS — R531 Weakness: Secondary | ICD-10-CM | POA: Diagnosis not present

## 2022-08-22 LAB — CBC
HCT: 31.7 % — ABNORMAL LOW (ref 39.0–52.0)
Hemoglobin: 9.7 g/dL — ABNORMAL LOW (ref 13.0–17.0)
MCH: 26.9 pg (ref 26.0–34.0)
MCHC: 30.6 g/dL (ref 30.0–36.0)
MCV: 87.8 fL (ref 80.0–100.0)
Platelets: 170 10*3/uL (ref 150–400)
RBC: 3.61 MIL/uL — ABNORMAL LOW (ref 4.22–5.81)
RDW: 15.2 % (ref 11.5–15.5)
WBC: 4 10*3/uL (ref 4.0–10.5)
nRBC: 0 % (ref 0.0–0.2)

## 2022-08-22 LAB — BASIC METABOLIC PANEL
Anion gap: 11 (ref 5–15)
BUN: 26 mg/dL — ABNORMAL HIGH (ref 8–23)
CO2: 26 mmol/L (ref 22–32)
Calcium: 9.3 mg/dL (ref 8.9–10.3)
Chloride: 100 mmol/L (ref 98–111)
Creatinine, Ser: 1.45 mg/dL — ABNORMAL HIGH (ref 0.61–1.24)
GFR, Estimated: 53 mL/min — ABNORMAL LOW (ref >60)
Glucose, Bld: 144 mg/dL — ABNORMAL HIGH (ref 70–99)
Potassium: 4.7 mmol/L (ref 3.5–5.1)
Sodium: 137 mmol/L (ref 135–145)

## 2022-08-22 LAB — TROPONIN I (HIGH SENSITIVITY)
Troponin I (High Sensitivity): 18 ng/L — ABNORMAL HIGH (ref <18)
Troponin I (High Sensitivity): 17 ng/L (ref <18)

## 2022-08-22 NOTE — ED Triage Notes (Signed)
First nurse note: Patient arrived by EMS from home for weakness that started today. Ambulatory with EMS to stretcher.   History MI X2  EMS vitals: 148/66 b/p 70pulse 100% RA 198 CBG

## 2022-08-22 NOTE — ED Triage Notes (Signed)
Pt brought in via ems from home with weakness.  Pt denies n/v/d.  Pt denies chest pain or sob.  Pt alert  speech clear.

## 2022-08-22 NOTE — Discharge Instructions (Addendum)
Your blood work and EKG and vital signs were all reassuring.  If you develop new symptoms such as pain in your left arm chest pain shortness of breath or feeling increasingly lightheaded or weakness is worsening please return to the emergency department.
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
## 2022-08-25 LAB — CUP PACEART REMOTE DEVICE CHECK
Date Time Interrogation Session: 20240318230942
Implantable Pulse Generator Implant Date: 20210503

## 2022-08-28 ENCOUNTER — Ambulatory Visit (INDEPENDENT_AMBULATORY_CARE_PROVIDER_SITE_OTHER): Payer: Medicare HMO

## 2022-08-28 DIAGNOSIS — R55 Syncope and collapse: Secondary | ICD-10-CM

## 2022-08-28 NOTE — Assessment & Plan Note (Signed)
Left ventricular ejection fraction, by estimation, is 40 to 45% on echo 05/2022. On appropriate therapy, Altace, Lasix, Toprol, and [place].

## 2022-08-31 DIAGNOSIS — M79642 Pain in left hand: Secondary | ICD-10-CM | POA: Diagnosis not present

## 2022-08-31 DIAGNOSIS — R278 Other lack of coordination: Secondary | ICD-10-CM | POA: Diagnosis not present

## 2022-08-31 DIAGNOSIS — Z5181 Encounter for therapeutic drug level monitoring: Secondary | ICD-10-CM | POA: Diagnosis not present

## 2022-08-31 DIAGNOSIS — R569 Unspecified convulsions: Secondary | ICD-10-CM | POA: Diagnosis not present

## 2022-08-31 DIAGNOSIS — M79641 Pain in right hand: Secondary | ICD-10-CM | POA: Diagnosis not present

## 2022-09-04 NOTE — Progress Notes (Signed)
Carelink Summary Report / Loop Recorder 

## 2022-10-02 ENCOUNTER — Ambulatory Visit (INDEPENDENT_AMBULATORY_CARE_PROVIDER_SITE_OTHER): Payer: Medicare HMO

## 2022-10-02 ENCOUNTER — Other Ambulatory Visit: Payer: Self-pay

## 2022-10-02 ENCOUNTER — Other Ambulatory Visit
Admission: RE | Admit: 2022-10-02 | Discharge: 2022-10-02 | Disposition: A | Discharge status: Home / Self Care | Payer: Medicare PPO | Source: Ambulatory Visit | Attending: Physician Assistant | Admitting: Physician Assistant

## 2022-10-02 ENCOUNTER — Emergency Department: Payer: Medicare PPO

## 2022-10-02 DIAGNOSIS — R778 Other specified abnormalities of plasma proteins: Secondary | ICD-10-CM | POA: Diagnosis not present

## 2022-10-02 DIAGNOSIS — Z7984 Long term (current) use of oral hypoglycemic drugs: Secondary | ICD-10-CM

## 2022-10-02 DIAGNOSIS — E785 Hyperlipidemia, unspecified: Secondary | ICD-10-CM | POA: Diagnosis not present

## 2022-10-02 DIAGNOSIS — Z7985 Long-term (current) use of injectable non-insulin antidiabetic drugs: Secondary | ICD-10-CM | POA: Diagnosis not present

## 2022-10-02 DIAGNOSIS — I5043 Acute on chronic combined systolic (congestive) and diastolic (congestive) heart failure: Secondary | ICD-10-CM | POA: Diagnosis not present

## 2022-10-02 DIAGNOSIS — H8309 Labyrinthitis, unspecified ear: Secondary | ICD-10-CM | POA: Diagnosis not present

## 2022-10-02 DIAGNOSIS — Z8673 Personal history of transient ischemic attack (TIA), and cerebral infarction without residual deficits: Secondary | ICD-10-CM | POA: Diagnosis not present

## 2022-10-02 DIAGNOSIS — I6503 Occlusion and stenosis of bilateral vertebral arteries: Secondary | ICD-10-CM | POA: Diagnosis not present

## 2022-10-02 DIAGNOSIS — I252 Old myocardial infarction: Secondary | ICD-10-CM

## 2022-10-02 DIAGNOSIS — Z79899 Other long term (current) drug therapy: Secondary | ICD-10-CM | POA: Diagnosis not present

## 2022-10-02 DIAGNOSIS — I5042 Chronic combined systolic (congestive) and diastolic (congestive) heart failure: Secondary | ICD-10-CM | POA: Diagnosis not present

## 2022-10-02 DIAGNOSIS — I480 Paroxysmal atrial fibrillation: Secondary | ICD-10-CM | POA: Diagnosis present

## 2022-10-02 DIAGNOSIS — R55 Syncope and collapse: Secondary | ICD-10-CM

## 2022-10-02 DIAGNOSIS — I130 Hypertensive heart and chronic kidney disease with heart failure and stage 1 through stage 4 chronic kidney disease, or unspecified chronic kidney disease: Secondary | ICD-10-CM | POA: Diagnosis not present

## 2022-10-02 DIAGNOSIS — I951 Orthostatic hypotension: Secondary | ICD-10-CM | POA: Diagnosis not present

## 2022-10-02 DIAGNOSIS — Z952 Presence of prosthetic heart valve: Secondary | ICD-10-CM | POA: Diagnosis not present

## 2022-10-02 DIAGNOSIS — R079 Chest pain, unspecified: Secondary | ICD-10-CM | POA: Diagnosis not present

## 2022-10-02 DIAGNOSIS — J449 Chronic obstructive pulmonary disease, unspecified: Secondary | ICD-10-CM | POA: Diagnosis not present

## 2022-10-02 DIAGNOSIS — N183 Chronic kidney disease, stage 3 unspecified: Secondary | ICD-10-CM | POA: Diagnosis not present

## 2022-10-02 DIAGNOSIS — E1122 Type 2 diabetes mellitus with diabetic chronic kidney disease: Secondary | ICD-10-CM | POA: Diagnosis present

## 2022-10-02 DIAGNOSIS — N1831 Chronic kidney disease, stage 3a: Secondary | ICD-10-CM | POA: Diagnosis present

## 2022-10-02 DIAGNOSIS — R42 Dizziness and giddiness: Secondary | ICD-10-CM | POA: Diagnosis not present

## 2022-10-02 DIAGNOSIS — Z87891 Personal history of nicotine dependence: Secondary | ICD-10-CM | POA: Diagnosis not present

## 2022-10-02 DIAGNOSIS — I2510 Atherosclerotic heart disease of native coronary artery without angina pectoris: Secondary | ICD-10-CM | POA: Diagnosis present

## 2022-10-02 DIAGNOSIS — I25810 Atherosclerosis of coronary artery bypass graft(s) without angina pectoris: Secondary | ICD-10-CM | POA: Diagnosis present

## 2022-10-02 DIAGNOSIS — I6523 Occlusion and stenosis of bilateral carotid arteries: Secondary | ICD-10-CM | POA: Diagnosis not present

## 2022-10-02 DIAGNOSIS — Z7901 Long term (current) use of anticoagulants: Secondary | ICD-10-CM | POA: Diagnosis not present

## 2022-10-02 DIAGNOSIS — K219 Gastro-esophageal reflux disease without esophagitis: Secondary | ICD-10-CM | POA: Diagnosis present

## 2022-10-02 DIAGNOSIS — I4891 Unspecified atrial fibrillation: Secondary | ICD-10-CM | POA: Insufficient documentation

## 2022-10-02 DIAGNOSIS — Z947 Corneal transplant status: Secondary | ICD-10-CM

## 2022-10-02 LAB — COMPREHENSIVE METABOLIC PANEL
ALT: 17 U/L (ref 0–44)
AST: 21 U/L (ref 15–41)
Albumin: 3.9 g/dL (ref 3.5–5.0)
Alkaline Phosphatase: 95 U/L (ref 38–126)
Anion gap: 12 (ref 5–15)
BUN: 28 mg/dL — ABNORMAL HIGH (ref 8–23)
CO2: 26 mmol/L (ref 22–32)
Calcium: 8.9 mg/dL (ref 8.9–10.3)
Chloride: 98 mmol/L (ref 98–111)
Creatinine, Ser: 1.55 mg/dL — ABNORMAL HIGH (ref 0.61–1.24)
GFR, Estimated: 49 mL/min — ABNORMAL LOW (ref >60)
Glucose, Bld: 229 mg/dL — ABNORMAL HIGH (ref 70–99)
Potassium: 3.8 mmol/L (ref 3.5–5.1)
Sodium: 136 mmol/L (ref 135–145)
Total Bilirubin: 0.5 mg/dL (ref 0.3–1.2)
Total Protein: 8.1 g/dL (ref 6.5–8.1)

## 2022-10-02 LAB — CBC WITH DIFFERENTIAL/PLATELET
Abs Immature Granulocytes: 0.02 10*3/uL (ref 0.00–0.07)
Basophils Absolute: 0 10*3/uL (ref 0.0–0.1)
Basophils Relative: 0 %
Eosinophils Absolute: 0.1 10*3/uL (ref 0.0–0.5)
Eosinophils Relative: 1 %
HCT: 32.9 % — ABNORMAL LOW (ref 39.0–52.0)
Hemoglobin: 10.1 g/dL — ABNORMAL LOW (ref 13.0–17.0)
Immature Granulocytes: 0 %
Lymphocytes Relative: 22 %
Lymphs Abs: 1.3 10*3/uL (ref 0.7–4.0)
MCH: 25.6 pg — ABNORMAL LOW (ref 26.0–34.0)
MCHC: 30.7 g/dL (ref 30.0–36.0)
MCV: 83.3 fL (ref 80.0–100.0)
Monocytes Absolute: 0.6 10*3/uL (ref 0.1–1.0)
Monocytes Relative: 9 %
Neutro Abs: 4 10*3/uL (ref 1.7–7.7)
Neutrophils Relative %: 68 %
Platelets: 141 10*3/uL — ABNORMAL LOW (ref 150–400)
RBC: 3.95 MIL/uL — ABNORMAL LOW (ref 4.22–5.81)
RDW: 15.2 % (ref 11.5–15.5)
WBC: 5.9 10*3/uL (ref 4.0–10.5)
nRBC: 0 % (ref 0.0–0.2)

## 2022-10-02 LAB — CUP PACEART REMOTE DEVICE CHECK
Date Time Interrogation Session: 20240428230920
Implantable Pulse Generator Implant Date: 20210503

## 2022-10-02 LAB — TROPONIN I (HIGH SENSITIVITY)
Troponin I (High Sensitivity): 28 ng/L — ABNORMAL HIGH (ref <18)
Troponin I (High Sensitivity): 31 ng/L — ABNORMAL HIGH (ref <18)

## 2022-10-02 LAB — BRAIN NATRIURETIC PEPTIDE: B Natriuretic Peptide: 531.2 pg/mL — ABNORMAL HIGH (ref 0.0–100.0)

## 2022-10-02 NOTE — ED Triage Notes (Signed)
Ambulatory to triage with steady gait without  use of assitive devices. Pt from home. Seen at [facility] today with reported eleavated troponin of 28. Pt states he had 2 recent MI's, most recent in Feb.  Pt denies chest pain. But States for the past week had had exertional weakness to the point of having to sit in floor and crawl to his chair.

## 2022-10-02 NOTE — ED Notes (Signed)
Rainbow sent to the lab at this time.  

## 2022-10-03 ENCOUNTER — Other Ambulatory Visit: Payer: Self-pay

## 2022-10-03 ENCOUNTER — Inpatient Hospital Stay
Admission: EM | Admit: 2022-10-03 | Discharge: 2022-10-04 | DRG: 312 | Disposition: A | Discharge status: Home Health | Payer: Medicare PPO | Attending: Internal Medicine | Admitting: Internal Medicine

## 2022-10-03 ENCOUNTER — Encounter: Payer: Self-pay | Admitting: Internal Medicine

## 2022-10-03 ENCOUNTER — Emergency Department: Payer: Medicare PPO

## 2022-10-03 DIAGNOSIS — Z947 Corneal transplant status: Secondary | ICD-10-CM | POA: Diagnosis not present

## 2022-10-03 DIAGNOSIS — H8309 Labyrinthitis, unspecified ear: Secondary | ICD-10-CM | POA: Diagnosis present

## 2022-10-03 DIAGNOSIS — J449 Chronic obstructive pulmonary disease, unspecified: Secondary | ICD-10-CM | POA: Diagnosis present

## 2022-10-03 DIAGNOSIS — I5A Non-ischemic myocardial injury (non-traumatic): Secondary | ICD-10-CM

## 2022-10-03 DIAGNOSIS — I6503 Occlusion and stenosis of bilateral vertebral arteries: Secondary | ICD-10-CM | POA: Diagnosis not present

## 2022-10-03 DIAGNOSIS — I480 Paroxysmal atrial fibrillation: Secondary | ICD-10-CM | POA: Diagnosis present

## 2022-10-03 DIAGNOSIS — Z952 Presence of prosthetic heart valve: Secondary | ICD-10-CM | POA: Diagnosis not present

## 2022-10-03 DIAGNOSIS — I252 Old myocardial infarction: Secondary | ICD-10-CM | POA: Diagnosis not present

## 2022-10-03 DIAGNOSIS — E785 Hyperlipidemia, unspecified: Secondary | ICD-10-CM | POA: Diagnosis present

## 2022-10-03 DIAGNOSIS — Z87891 Personal history of nicotine dependence: Secondary | ICD-10-CM | POA: Diagnosis not present

## 2022-10-03 DIAGNOSIS — R42 Dizziness and giddiness: Secondary | ICD-10-CM | POA: Diagnosis present

## 2022-10-03 DIAGNOSIS — I6523 Occlusion and stenosis of bilateral carotid arteries: Secondary | ICD-10-CM | POA: Diagnosis not present

## 2022-10-03 DIAGNOSIS — Z8673 Personal history of transient ischemic attack (TIA), and cerebral infarction without residual deficits: Secondary | ICD-10-CM | POA: Diagnosis not present

## 2022-10-03 DIAGNOSIS — Z7985 Long-term (current) use of injectable non-insulin antidiabetic drugs: Secondary | ICD-10-CM | POA: Diagnosis not present

## 2022-10-03 DIAGNOSIS — Z7984 Long term (current) use of oral hypoglycemic drugs: Secondary | ICD-10-CM | POA: Diagnosis not present

## 2022-10-03 DIAGNOSIS — N183 Chronic kidney disease, stage 3 unspecified: Secondary | ICD-10-CM

## 2022-10-03 DIAGNOSIS — I5042 Chronic combined systolic (congestive) and diastolic (congestive) heart failure: Secondary | ICD-10-CM | POA: Diagnosis present

## 2022-10-03 DIAGNOSIS — I951 Orthostatic hypotension: Secondary | ICD-10-CM | POA: Diagnosis present

## 2022-10-03 DIAGNOSIS — I130 Hypertensive heart and chronic kidney disease with heart failure and stage 1 through stage 4 chronic kidney disease, or unspecified chronic kidney disease: Secondary | ICD-10-CM | POA: Diagnosis present

## 2022-10-03 DIAGNOSIS — Z79899 Other long term (current) drug therapy: Secondary | ICD-10-CM | POA: Diagnosis not present

## 2022-10-03 DIAGNOSIS — I10 Essential (primary) hypertension: Secondary | ICD-10-CM | POA: Diagnosis present

## 2022-10-03 DIAGNOSIS — E1122 Type 2 diabetes mellitus with diabetic chronic kidney disease: Secondary | ICD-10-CM | POA: Diagnosis present

## 2022-10-03 DIAGNOSIS — Z7901 Long term (current) use of anticoagulants: Secondary | ICD-10-CM | POA: Diagnosis not present

## 2022-10-03 DIAGNOSIS — I25810 Atherosclerosis of coronary artery bypass graft(s) without angina pectoris: Secondary | ICD-10-CM | POA: Diagnosis present

## 2022-10-03 DIAGNOSIS — N1831 Chronic kidney disease, stage 3a: Secondary | ICD-10-CM | POA: Diagnosis present

## 2022-10-03 DIAGNOSIS — E119 Type 2 diabetes mellitus without complications: Secondary | ICD-10-CM | POA: Diagnosis present

## 2022-10-03 DIAGNOSIS — I2510 Atherosclerotic heart disease of native coronary artery without angina pectoris: Secondary | ICD-10-CM | POA: Diagnosis present

## 2022-10-03 DIAGNOSIS — K219 Gastro-esophageal reflux disease without esophagitis: Secondary | ICD-10-CM | POA: Diagnosis present

## 2022-10-03 DIAGNOSIS — I5022 Chronic systolic (congestive) heart failure: Secondary | ICD-10-CM | POA: Diagnosis present

## 2022-10-03 DIAGNOSIS — R7989 Other specified abnormal findings of blood chemistry: Secondary | ICD-10-CM

## 2022-10-03 LAB — URINALYSIS, ROUTINE W REFLEX MICROSCOPIC
Bacteria, UA: NONE SEEN
Bilirubin Urine: NEGATIVE
Glucose, UA: >=500 mg/dL — AB
Ketones, ur: NEGATIVE mg/dL
Leukocytes,Ua: NEGATIVE
Nitrite: NEGATIVE
Protein, ur: 30 mg/dL — AB
Specific Gravity, Urine: 1.029 (ref 1.005–1.030)
Squamous Epithelial / HPF: NONE SEEN /HPF (ref 0–5)
pH: 7 (ref 5.0–8.0)

## 2022-10-03 LAB — TROPONIN I (HIGH SENSITIVITY)
Troponin I (High Sensitivity): 26 ng/L — ABNORMAL HIGH (ref <18)
Troponin I (High Sensitivity): 28 ng/L — ABNORMAL HIGH (ref <18)
Troponin I (High Sensitivity): 31 ng/L — ABNORMAL HIGH (ref <18)

## 2022-10-03 MED ORDER — FUROSEMIDE 20 MG PO TABS: 20.0000 mg | ORAL_TABLET | Freq: Every day | ORAL | Status: DC

## 2022-10-03 MED ORDER — DIVALPROEX SODIUM ER 250 MG PO TB24
250.0000 mg | ORAL_TABLET | Freq: Every day | ORAL | Status: DC
  Administered 2022-10-03: 250 mg via ORAL
  Filled 2022-10-03: qty 1

## 2022-10-03 MED ORDER — SODIUM CHLORIDE 0.9 % IV SOLN: 250.0000 mL | INTRAVENOUS | Status: DC | PRN

## 2022-10-03 MED ORDER — NITROGLYCERIN 0.4 MG SL SUBL: 0.4000 mg | SUBLINGUAL_TABLET | SUBLINGUAL | Status: DC | PRN

## 2022-10-03 MED ORDER — PANTOPRAZOLE SODIUM 40 MG PO TBEC
40.0000 mg | DELAYED_RELEASE_TABLET | Freq: Every day | ORAL | Status: DC
  Administered 2022-10-03 – 2022-10-04 (×2): 40 mg via ORAL
  Filled 2022-10-03 (×2): qty 1

## 2022-10-03 MED ORDER — DAPAGLIFLOZIN PROPANEDIOL 10 MG PO TABS
10.0000 mg | ORAL_TABLET | Freq: Every day | ORAL | Status: DC
  Administered 2022-10-03 – 2022-10-04 (×2): 10 mg via ORAL
  Filled 2022-10-03 (×3): qty 1

## 2022-10-03 MED ORDER — SODIUM CHLORIDE 0.9 % IV BOLUS
500.0000 mL | Freq: Once | INTRAVENOUS | Status: AC
  Administered 2022-10-03: 500 mL via INTRAVENOUS

## 2022-10-03 MED ORDER — SODIUM CHLORIDE 0.9% FLUSH
3.0000 mL | Freq: Two times a day (BID) | INTRAVENOUS | Status: DC
  Administered 2022-10-03 – 2022-10-04 (×2): 3 mL via INTRAVENOUS

## 2022-10-03 MED ORDER — APIXABAN 5 MG PO TABS
5.0000 mg | ORAL_TABLET | Freq: Two times a day (BID) | ORAL | Status: DC
  Administered 2022-10-03 – 2022-10-04 (×3): 5 mg via ORAL
  Filled 2022-10-03 (×3): qty 1

## 2022-10-03 MED ORDER — ATORVASTATIN CALCIUM 20 MG PO TABS
80.0000 mg | ORAL_TABLET | Freq: Every day | ORAL | Status: DC
  Administered 2022-10-03: 80 mg via ORAL
  Filled 2022-10-03: qty 4

## 2022-10-03 MED ORDER — MECLIZINE HCL 25 MG PO TABS: 25.0000 mg | ORAL_TABLET | Freq: Two times a day (BID) | ORAL | Status: DC | PRN

## 2022-10-03 MED ORDER — MECLIZINE HCL 25 MG PO TABS
25.0000 mg | ORAL_TABLET | Freq: Two times a day (BID) | ORAL | Status: DC
  Administered 2022-10-03: 25 mg via ORAL
  Filled 2022-10-03: qty 1

## 2022-10-03 MED ORDER — ZOLPIDEM TARTRATE 5 MG PO TABS: 5.0000 mg | ORAL_TABLET | Freq: Every evening | ORAL | Status: DC | PRN

## 2022-10-03 MED ORDER — SODIUM CHLORIDE 0.9% FLUSH: 3.0000 mL | INTRAVENOUS | Status: DC | PRN

## 2022-10-03 MED ORDER — ACETAMINOPHEN 500 MG PO TABS
1000.0000 mg | ORAL_TABLET | Freq: Four times a day (QID) | ORAL | Status: DC | PRN
  Filled 2022-10-03: qty 2

## 2022-10-03 MED ORDER — ORAL CARE MOUTH RINSE: 15.0000 mL | OROMUCOSAL | Status: DC | PRN

## 2022-10-03 MED ORDER — METOPROLOL SUCCINATE ER 50 MG PO TB24
50.0000 mg | ORAL_TABLET | Freq: Every day | ORAL | Status: DC
  Administered 2022-10-03 – 2022-10-04 (×2): 50 mg via ORAL
  Filled 2022-10-03 (×2): qty 1

## 2022-10-03 MED ORDER — RAMIPRIL 2.5 MG PO CAPS
2.5000 mg | ORAL_CAPSULE | Freq: Every day | ORAL | Status: DC
  Administered 2022-10-03: 2.5 mg via ORAL
  Filled 2022-10-03 (×2): qty 1

## 2022-10-03 MED ORDER — METOPROLOL SUCCINATE ER 100 MG PO TB24: 100.0000 mg | ORAL_TABLET | Freq: Every day | ORAL | Status: DC

## 2022-10-03 MED ORDER — DULAGLUTIDE 0.75 MG/0.5ML ~~LOC~~ SOAJ: 0.7500 mg | SUBCUTANEOUS | Status: DC

## 2022-10-03 MED ORDER — ISOSORBIDE MONONITRATE ER 30 MG PO TB24
30.0000 mg | ORAL_TABLET | Freq: Every day | ORAL | Status: DC
  Administered 2022-10-03 – 2022-10-04 (×2): 30 mg via ORAL
  Filled 2022-10-03 (×2): qty 1

## 2022-10-03 MED ORDER — IOHEXOL 350 MG/ML SOLN
75.0000 mL | Freq: Once | INTRAVENOUS | Status: AC | PRN
  Administered 2022-10-03: 75 mL via INTRAVENOUS

## 2022-10-03 NOTE — Assessment & Plan Note (Addendum)
Patient's dizziness more when he standing up.  Patient orthostatic today cut back on Toprol dose and hold Lasix today and give a fluid bolus recheck orthostatic vitals tomorrow.  Will hold Altace also.

## 2022-10-03 NOTE — Progress Notes (Signed)
Progress Note   Patient: Thomas Moore:[id number] DOB: 1956/01/31 DOA: 10/03/2022     0 DOS: the patient was seen and examined on 10/03/2022   Brief hospital course: 67 y/o with h/o CAD with MI, most recently Feb '24, Combined CHF, h/o CVA, h/o Sz, PAF, HTN, CKD 3, DM2. He presents to [facility] ED due to intermittent episodes of severe dizziness w/o focal weakness, worse hearing, visual changes/room spinning. He has had one episode with emesis. He has not had chest pain, left arm pain (previous presenting symptom with MI), no SOB.     ED Course: 98.1  133/72  HR 75  RR 20. Lab - glucose 229, Cr 1.55 (chronic) BNP 531, Troponins: 28,31, CBCD nl with chronic anem,ia. EKG with A. Fib with rate of 111. TRH called to admit for evaluation of dizziness  4/30.  Patient orthostatic this morning.  Will give a fluid bolus and hold Lasix and cut back on Toprol dose.  Continue to monitor orthostatic vital signs.  Patient not having vertigo when I lying back looking to each side.  Assessment and Plan: * Orthostatic hypotension Patient's dizziness more when he standing up.  Patient orthostatic today cut back on Toprol dose and hold Lasix today and give a fluid bolus recheck orthostatic vitals tomorrow.  Will hold Altace also.  Chronic combined systolic and diastolic congestive heart failure (HCC) Last EF 40 to 45%.  Holding Lasix today and giving a fluid bolus.  Decrease dose of Toprol.  Hold Altace.  Myocardial injury Troponin only borderline increased  Paroxysmal atrial fibrillation (HCC) Continue Eliquis for anticoagulation cut back on Toprol dose.  Essential hypertension Patient orthostatic.  Hold Lasix and cut back on Toprol dose.  Hold Altace.  Controlled type 2 diabetes mellitus with stage 3 chronic kidney disease, without long-term current use of insulin (HCC) Patient on Farxiga and Trulicity as outpatient.  Continue Marcelline Deist here.  Creatinine 1.55 with a GFR 49.        Subjective: Patient  feels dizziness more when he standing up not when he is changing position of his head.  No dizziness brought on by lying backwards with head to each side.  Patient orthostatic this morning.  Will give IV fluid bolus and cut back on Toprol dose and hold Lasix.  Physical Exam: Vitals:   10/02/22 2330 10/03/22 0100 10/03/22 0715 10/03/22 0910  BP: 133/72 126/68 (!) 152/78 123/77  Pulse: 75 82 (!) 107 87  Resp: 20 (!) 21 18 18   Temp: 98.1 F (36.7 C)  97.9 F (36.6 C) 98 F (36.7 C)  TempSrc: Oral     SpO2: 100% 100% 100% 100%  Weight:   63 kg   Height:   5\' 3"  (1.6 m)    Physical Exam HENT:     Head: Normocephalic.     Mouth/Throat:     Pharynx: No oropharyngeal exudate.  Eyes:     General: Lids are normal.     Conjunctiva/sclera: Conjunctivae normal.  Cardiovascular:     Rate and Rhythm: Normal rate and regular rhythm.     Heart sounds: Normal heart sounds, S1 normal and S2 normal.  Pulmonary:     Breath sounds: No decreased breath sounds, wheezing, rhonchi or rales.  Abdominal:     Palpations: Abdomen is soft.     Tenderness: There is no abdominal tenderness.  Musculoskeletal:     Right lower leg: No swelling.     Left lower leg: No swelling.  Skin:  General: Skin is warm.     Findings: No rash.  Neurological:     Mental Status: He is alert and oriented to person, place, and time.     Comments: Power 5 out of 5 upper and lower extremities.     Data Reviewed: Hemoglobin 10.1, platelet count 141, BMP 531.2, creatinine 1.55 with a BUN of 28  Family Communication: Declined  Disposition: Status is: Inpatient Remains inpatient appropriate because: With orthostasis today hold off on Lasix and give a fluid bolus.  Cut back on Toprol dose.  Recheck orthostatic vital signs.  Planned Discharge Destination: Home    Time spent: 28 minutes  Author: Alford Highland, MD 10/03/2022 3:35 PM  For on call review [email or web address].

## 2022-10-03 NOTE — Assessment & Plan Note (Deleted)
Last EF 40 to 45%.  No signs of heart failure currently.  Hold Lasix today.

## 2022-10-03 NOTE — Assessment & Plan Note (Deleted)
Chronic problem with Cr at baseline  Plan Avoid nephrotoxic drugs

## 2022-10-03 NOTE — Hospital Course (Signed)
67 y/o with h/o CAD with MI, most recently Feb '24, Combined CHF, h/o CVA, h/o Sz, PAF, HTN, CKD 3, DM2. He presents to [facility] ED due to intermittent episodes of severe dizziness w/o focal weakness, worse hearing, visual changes/room spinning. He has had one episode with emesis. He has not had chest pain, left arm pain (previous presenting symptom with MI), no SOB.     ED Course: 98.1  133/72  HR 75  RR 20. Lab - glucose 229, Cr 1.55 (chronic) BNP 531, Troponins: 28,31, CBCD nl with chronic anem,ia. EKG with A. Fib with rate of 111. TRH called to admit for evaluation of dizziness  4/30.  Patient orthostatic this morning.  Will give a fluid bolus and hold Lasix and cut back on Toprol dose.  Continue to monitor orthostatic vital signs.  Patient not having vertigo when I lying back looking to each side.

## 2022-10-03 NOTE — Assessment & Plan Note (Signed)
Last EF 40 to 45%.  Holding Lasix today and giving a fluid bolus.  Decrease dose of Toprol.  Hold Altace.

## 2022-10-03 NOTE — Evaluation (Signed)
Physical Therapy Evaluation Patient Details Name: Thomas Moore MRN: [id number] DOB: 1955/11/17 Today's Date: 10/03/2022  History of Present Illness  Pt is a 67 y/o with h/o CAD with MI, most recently Feb '24, Combined CHF, COPD, h/o CVA, h/o Sz, PAF, HTN, CKD 3, DM2. He presents to [facility] ED due to intermittent episodes of severe dizziness w/o focal weakness, worse hearing, visual changes/room spinning.   Clinical Impression  Patient alert, agreeable to PT hyper verbose during session. Pt reported at baseline he lives in an apartment and recently in the last week is using his RW due to dizziness, lives alone. Pt requested to use the bathroom, able to ambulate and transfer with CGA-supervision. He did intermittently reach for at least unilateral support and agreeable to use RW while dizzy, but able to perform all toileting activities with supervision as well. Overall the patient demonstrated near return to baseline but would benefit from further skilled PT intervention to maximize safety and balance.        Recommendations for follow up therapy are one component of a multi-disciplinary discharge planning process, led by the attending physician.  Recommendations may be updated based on patient status, additional functional criteria and insurance authorization.  Follow Up Recommendations       Assistance Recommended at Discharge PRN  Patient can return home with the following       Equipment Recommendations None recommended by PT  Recommendations for Other Services       Functional Status Assessment Patient has had a recent decline in their functional status and demonstrates the ability to make significant improvements in function in a reasonable and predictable amount of time.     Precautions / Restrictions Precautions Precautions: Fall Restrictions Weight Bearing Restrictions: No      Mobility  Bed Mobility Overal bed mobility: Independent                   Transfers Overall transfer level: Modified independent Equipment used: None                    Ambulation/Gait Ambulation/Gait assistance: Min guard Gait Distance (Feet): 25 Feet Assistive device: None         General Gait Details: able to ambulate in room without AD, did occasionally reach out for unilateral UE support  Stairs            Wheelchair Mobility    Modified Rankin (Stroke Patients Only)       Balance Overall balance assessment: Needs assistance Sitting-balance support: Feet supported Sitting balance-Leahy Scale: Good       Standing balance-Leahy Scale: Good                               Pertinent Vitals/Pain Pain Assessment Pain Assessment: No/denies pain    Home Living Family/patient expects to be discharged to:: Private residence Living Arrangements: Alone   Type of Home: Apartment Home Access: Level entry;Elevator       Home Layout: One level Home Equipment: Grab bars - toilet;Grab bars - tub/shower      Prior Function               Mobility Comments: IND for household mobilization without assist device due to recent dizziness he was using the RW at home ADLs Comments: Pt reports IND for ADLs. Friends/family assist with IADLs, driving/community errands.     Hand Dominance   Dominant Hand: Right  Extremity/Trunk Assessment   Upper Extremity Assessment Upper Extremity Assessment: Generalized weakness    Lower Extremity Assessment Lower Extremity Assessment: Generalized weakness    Cervical / Trunk Assessment Cervical / Trunk Assessment: Normal  Communication   Communication: HOH  Cognition Arousal/Alertness: Awake/alert Behavior During Therapy: WFL for tasks assessed/performed Overall Cognitive Status: Within Functional Limits for tasks assessed                                          General Comments      Exercises     Assessment/Plan    PT Assessment Patient  needs continued PT services  PT Problem List Decreased mobility;Decreased balance       PT Treatment Interventions DME instruction;Therapeutic activities;Gait training;Therapeutic exercise;Patient/family education;Stair training;Balance training;Functional mobility training;Neuromuscular re-education    PT Goals (Current goals can be found in the Care Plan section)  Acute Rehab PT Goals Patient Stated Goal: to go home PT Goal Formulation: With patient Time For Goal Achievement: 10/17/22 Potential to Achieve Goals: Good Additional Goals Additional Goal #1: Pt will demonstrate a BERG score of at least 41 to indicate low fall risk.    Frequency Min 2X/week     Co-evaluation               AM-PAC PT "6 Clicks" Mobility  Outcome Measure Help needed turning from your back to your side while in a flat bed without using bedrails?: None Help needed moving from lying on your back to sitting on the side of a flat bed without using bedrails?: None Help needed moving to and from a bed to a chair (including a wheelchair)?: None Help needed standing up from a chair using your arms (e.g., wheelchair or bedside chair)?: None Help needed to walk in hospital room?: None Help needed climbing 3-5 steps with a railing? : A Little 6 Click Score: 23    End of Session   Activity Tolerance: Patient tolerated treatment well Patient left: in chair;with call bell/phone within reach Nurse Communication: Mobility status PT Visit Diagnosis: Dizziness and giddiness (R42);Other abnormalities of gait and mobility (R26.89)    Time: 1610-9604 PT Time Calculation (min) (ACUTE ONLY): 10 min   Charges:   PT Evaluation $PT Eval Low Complexity: 1 Low          Olga Coaster PT, DPT 11:40 AM,10/03/22
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
## 2022-10-03 NOTE — H&P (Signed)
History and Physical    Thomas Moore:[id number] DOB: 1956-02-25 DOA: 10/03/2022  DOS: the patient was seen and examined on 10/03/2022  PCP: Lauro Regulus, MD   Patient coming from: Home  I have personally briefly reviewed patient's old medical records in [facility]  Thomas Moore, a 67 y/o with h/o CAD with MI, most recently Feb '24, Combined CHF, h/o CVA, h/o Sz, PAF, HTN, CKD 3, DM2. He presents to [facility] ED due to intermittent episodes of severe dizziness w/o focal weakness, worse hearing, visual changes/room spinning. He has had one episode with emesis. He has not had chest pain, left arm pain (previous presenting symptom with MI), no SOB.    ED Course: 98.1  133/72  HR 75  RR 20. Lab - glucose 229, Cr 1.55 (chronic) BNP 531, Troponins: 28,31, CBCD nl with chronic anem,ia. EKG with A. Fib with rate of 111. TRH called to admit for evaluation of dizziness  Review of Systems:  Review of Systems  Constitutional:  Negative for chills, fever and weight loss.  HENT:  Positive for hearing loss. Negative for ear discharge, ear pain, sinus pain and tinnitus.   Eyes: Negative.   Respiratory:  Negative for cough, shortness of breath and wheezing.   Cardiovascular:  Negative for chest pain, palpitations and orthopnea.  Gastrointestinal: Negative.   Genitourinary: Negative.   Musculoskeletal: Negative.   Skin: Negative.   Neurological:  Positive for dizziness and weakness.  Endo/Heme/Allergies: Negative.   Psychiatric/Behavioral: Negative.      Past Medical History:  Diagnosis Date   CAD (coronary artery disease) of artery bypass graft    s/p CABG x 4 in 1997   CHF (congestive heart failure) (HCC)    COPD (chronic obstructive pulmonary disease) (HCC)    Coronary artery disease    CVA (cerebral vascular accident) (HCC)    Diabetes mellitus without complication (HCC)    Dysrhythmia    GERD (gastroesophageal reflux disease)    HLD (hyperlipidemia)    Hypertension    S/P  TAVR (transcatheter aortic valve replacement) 07/05/2021   with Edwards 26mm S3UR via TF approach with Dr. Excell Seltzer and Dr. Laneta Simmers   Seizures [facility])     Past Surgical History:  Procedure Laterality Date   BYPASS GRAFT ANGIOGRAPHY N/A 04/25/2021   Procedure: BYPASS GRAFT ANGIOGRAPHY;  Surgeon: Armando Reichert, MD;  Location: [facility] INVASIVE CV LAB;  Service: Cardiovascular;  Laterality: N/A;   CARDIAC SURGERY     CORNEAL TRANSPLANT Right    CORONARY ARTERY BYPASS GRAFT  1997   ELECTROPHYSIOLOGY STUDY N/A 09/08/2021   Procedure: ELECTROPHYSIOLOGY STUDY;  Surgeon: Lanier Prude, MD;  Location: [facility] INVASIVE CV LAB;  Service: Cardiovascular;  Laterality: N/A;   EYE SURGERY     FLEXOR TENDON REPAIR Left 07/11/2019   Procedure: FLEXOR tenolysis  REPAIR LEFT RING FINGER with tednon repair;  Surgeon: Kennedy Bucker, MD;  Location: [facility] ORS;  Service: Orthopedics;  Laterality: Left;   INCISION AND DRAINAGE ABSCESS Left 05/08/2019   Procedure: INCISION AND DRAINAGE ABSCESS;  Surgeon: Donato Heinz, MD;  Location: [facility] ORS;  Service: Orthopedics;  Laterality: Left;   INTRAOPERATIVE TRANSTHORACIC ECHOCARDIOGRAM N/A 07/05/2021   Procedure: INTRAOPERATIVE TRANSTHORACIC ECHOCARDIOGRAM;  Surgeon: Tonny Bollman, MD;  Location: [facility] OR;  Service: Open Heart Surgery;  Laterality: N/A;   LEFT HEART CATH AND CORONARY ANGIOGRAPHY Left 06/09/2022   Procedure: LEFT HEART CATH AND CORONARY ANGIOGRAPHY;  Surgeon: Laurier Nancy, MD;  Location: [facility] INVASIVE CV LAB;  Service: Cardiovascular;  Laterality: Left;   LEFT HEART CATH AND CORS/GRAFTS ANGIOGRAPHY N/A 10/02/2019   Procedure: LEFT HEART CATH AND CORS/GRAFTS ANGIOGRAPHY;  Surgeon: Marykay Lex, MD;  Location: [facility] INVASIVE CV LAB;  Service: Cardiovascular;  Laterality: N/A;   LEFT HEART CATH AND CORS/GRAFTS ANGIOGRAPHY N/A 07/17/2022   Procedure: LEFT HEART CATH AND CORS/GRAFTS ANGIOGRAPHY and possible PCI and stent;  Surgeon: Laurier Nancy, MD;  Location:  [facility] INVASIVE CV LAB;  Service: Cardiovascular;  Laterality: N/A;   LOOP RECORDER INSERTION N/A 10/06/2019   Procedure: LOOP RECORDER INSERTION;  Surgeon: Marinus Maw, MD;  Location: [facility] INVASIVE CV LAB;  Service: Cardiovascular;  Laterality: N/A;   TRANSCATHETER AORTIC VALVE REPLACEMENT, TRANSFEMORAL N/A 07/05/2021   Procedure: TRANSCATHETER AORTIC VALVE REPLACEMENT, TRANSFEMORAL;  Surgeon: Tonny Bollman, MD;  Location: [facility] OR;  Service: Open Heart Surgery;  Laterality: N/A;   TRIGGER FINGER RELEASE Left    Soc Hx -  1st marriage 18 months, 2nd marriage 58 years - divorced. No children. Lives alone. Retired.     reports that he quit smoking about 40 years ago. His smoking use included cigarettes. He has never used smokeless tobacco. He reports that he does not currently use alcohol. He reports that he does not use drugs.  No Known Allergies  Family History  Problem Relation Age of Onset   Seizures Brother     Prior to Admission medications   Medication Sig Start Date End Date Taking? Authorizing Provider  acetaminophen (TYLENOL) 650 MG CR tablet Take 1,300 mg by mouth every 8 (eight) hours as needed for pain.    [provider]  amoxicillin (AMOXIL) 500 MG tablet Take 2,000 mg by mouth See admin instructions. Take 4 tablets (2000mg ) by mouth 1 hour prior to dental procedures Patient not taking: Reported on 07/24/2022    [provider]  apixaban (ELIQUIS) 5 MG TABS tablet Take 1 tablet (5 mg total) by mouth 2 (two) times daily. 07/14/22   Lanier Prude, MD  atorvastatin (LIPITOR) 80 MG tablet Take 1 tablet (80 mg total) by mouth at bedtime. 08/11/22   Laurier Nancy, MD  Calcium Citrate-Vitamin D (CALCIUM + D PO) Take 1 tablet by mouth 2 (two) times daily.    [provider]  dapagliflozin propanediol (FARXIGA) 10 MG TABS tablet Take 1 tablet (10 mg total) by mouth daily before breakfast. 06/28/22   Delma Freeze, FNP  divalproex (DEPAKOTE ER) 250 MG 24 hr  tablet Take 250 mg by mouth at bedtime.    [provider]  furosemide (LASIX) 20 MG tablet Take 1 tablet (20 mg total) by mouth daily. 04/28/21   Lurene Shadow, MD  isosorbide mononitrate (IMDUR) 30 MG 24 hr tablet Take 30 mg by mouth daily.    [provider]  metoprolol succinate (TOPROL-XL) 100 MG 24 hr tablet Take 1 tablet (100 mg total) by mouth daily. 07/14/22   Lanier Prude, MD  Multiple Vitamin (MULTIVITAMIN) capsule Take 1 capsule by mouth daily.    [provider]  nitroGLYCERIN (NITROSTAT) 0.4 MG SL tablet Place 0.4 mg under the tongue every 5 (five) minutes as needed for chest pain.    [provider]  omeprazole (PRILOSEC) 40 MG capsule Take 40 mg by mouth daily.    [provider]  pantoprazole (PROTONIX) 40 MG tablet Take 40 mg by mouth daily.    [provider]  ramipril (ALTACE) 2.5 MG capsule Take 1 capsule (2.5 mg total)  by mouth daily. 04/28/21   Lurene Shadow, MD  TRULICITY 0.75 MG/0.5ML SOPN Inject 0.75 mg into the skin once a week. Wednesday Patient taking differently: Inject 4.5 mg into the skin once a week. Tuesday 02/19/20   Eulogio Bear E, NP    Physical Exam: Vitals:   10/02/22 1945 10/02/22 2330 10/03/22 0100  BP: 134/79 133/72 126/68  Pulse: 97 75 82  Resp: 18 20 (!) 21  Temp: 97.7 F (36.5 C) 98.1 F (36.7 C)   TempSrc: Oral Oral   SpO2: 97% 100% 100%  Weight: 63 kg    Height: 5\' 3"  (1.6 m)      Physical Exam Vitals and nursing note reviewed.  Constitutional:      General: He is not in acute distress.    Appearance: Normal appearance. He is normal weight. He is not toxic-appearing.  HENT:     Head: Normocephalic and atraumatic.     Mouth/Throat:     Mouth: Mucous membranes are moist.     Pharynx: Oropharynx is clear. No oropharyngeal exudate.  Eyes:     Extraocular Movements: Extraocular movements intact.     Conjunctiva/sclera: Conjunctivae normal.     Pupils: Pupils are equal,  round, and reactive to light.  Cardiovascular:     Rate and Rhythm: Tachycardia present. Rhythm irregular.     Pulses: Normal pulses.     Heart sounds: No murmur heard. Pulmonary:     Effort: Pulmonary effort is normal.     Breath sounds: Normal breath sounds. No wheezing or rales.  Abdominal:     General: Bowel sounds are normal.     Palpations: Abdomen is soft.  Musculoskeletal:        General: No swelling or deformity. Normal range of motion.     Cervical back: Normal range of motion and neck supple.     Right lower leg: No edema.     Left lower leg: No edema.  Skin:    General: Skin is warm and dry.  Neurological:     Mental Status: He is alert.     Cranial Nerves: No cranial nerve deficit.     Motor: No weakness.     Comments: CN II-XII - nl facial symmetry, PERRLA, EOMI, no nystagmus. With rapid head rotation - mild dizziness. No visual loss MS 5/5   Psychiatric:        Mood and Affect: Mood normal.        Behavior: Behavior normal.      Labs on Admission: I have personally reviewed following labs and imaging studies  CBC: Recent Labs  Lab 10/02/22 1948  WBC 5.9  NEUTROABS 4.0  HGB 10.1*  HCT 32.9*  MCV 83.3  PLT 141*   Basic Metabolic Panel: Recent Labs  Lab 10/02/22 1948  NA 136  K 3.8  CL 98  CO2 26  GLUCOSE 229*  BUN 28*  CREATININE 1.55*  CALCIUM 8.9   GFR: Estimated Creatinine Clearance: 37.2 mL/min (A) (by C-G formula based on SCr of 1.55 mg/dL (H)). Liver Function Tests: Recent Labs  Lab 10/02/22 1948  AST 21  ALT 17  ALKPHOS 95  BILITOT 0.5  PROT 8.1  ALBUMIN 3.9   No results for input(s): "LIPASE", "AMYLASE" in the last 168 hours. No results for input(s): "AMMONIA" in the last 168 hours. Coagulation Profile: No results for input(s): "INR", "PROTIME" in the last 168 hours. Cardiac Enzymes: No results for input(s): "CKTOTAL", "CKMB", "CKMBINDEX", "TROPONINI" in the last 168 hours.  BNP (last 3 results) No results for input(s):  "PROBNP" in the last 8760 hours. HbA1C: No results for input(s): "HGBA1C" in the last 72 hours. CBG: No results for input(s): "GLUCAP" in the last 168 hours. Lipid Profile: No results for input(s): "CHOL", "HDL", "LDLCALC", "TRIG", "CHOLHDL", "LDLDIRECT" in the last 72 hours. Thyroid Function Tests: No results for input(s): "TSH", "T4TOTAL", "FREET4", "T3FREE", "THYROIDAB" in the last 72 hours. Anemia Panel: No results for input(s): "VITAMINB12", "FOLATE", "FERRITIN", "TIBC", "IRON", "RETICCTPCT" in the last 72 hours. Urine analysis:    Component Value Date/Time   COLORURINE YELLOW 07/01/2021 0847   APPEARANCEUR CLEAR 07/01/2021 0847   APPEARANCEUR Clear 05/12/2020 0940   LABSPEC 1.018 07/01/2021 0847   PHURINE 6.0 07/01/2021 0847   GLUCOSEU NEGATIVE 07/01/2021 0847   HGBUR SMALL (A) 07/01/2021 0847   BILIRUBINUR NEGATIVE 07/01/2021 0847   BILIRUBINUR Negative 05/12/2020 0940   KETONESUR 5 (A) 07/01/2021 0847   PROTEINUR 100 (A) 07/01/2021 0847   NITRITE NEGATIVE 07/01/2021 0847   LEUKOCYTESUR NEGATIVE 07/01/2021 0847    Radiological Exams on Admission: I have personally reviewed images CT ANGIO HEAD NECK W WO CM  Result Date: 10/03/2022 CLINICAL DATA:  Dizziness EXAM: CT ANGIOGRAPHY HEAD AND NECK WITH AND WITHOUT CONTRAST TECHNIQUE: Multidetector CT imaging of the head and neck was performed using the standard protocol during bolus administration of intravenous contrast. Multiplanar CT image reconstructions and MIPs were obtained to evaluate the vascular anatomy. Carotid stenosis measurements (when applicable) are obtained utilizing NASCET criteria, using the distal internal carotid diameter as the denominator. RADIATION DOSE REDUCTION: This exam was performed according to the departmental dose-optimization program which includes automated exposure control, adjustment of the mA and/or kV according to patient size and/or use of iterative reconstruction technique. CONTRAST:  75mL  OMNIPAQUE IOHEXOL 350 MG/ML SOLN COMPARISON:  None Available. FINDINGS: CT HEAD FINDINGS Brain: There is no mass, hemorrhage or extra-axial collection. The size and configuration of the ventricles and extra-axial CSF spaces are normal. Anterior left frontal lobe encephalomalacia. There is hypoattenuation of the periventricular white matter, most commonly indicating chronic ischemic microangiopathy. Skull: The visualized skull base, calvarium and extracranial soft tissues are normal. Sinuses/Orbits: No fluid levels or advanced mucosal thickening of the visualized paranasal sinuses. No mastoid or middle ear effusion. The orbits are normal. CTA NECK FINDINGS SKELETON: There is no bony spinal canal stenosis. No lytic or blastic lesion. OTHER NECK: Normal pharynx, larynx and major salivary glands. No cervical lymphadenopathy. Unremarkable thyroid gland. UPPER CHEST: No pneumothorax or pleural effusion. No nodules or masses. AORTIC ARCH: There is calcific atherosclerosis of the aortic arch. There is no aneurysm, dissection or hemodynamically significant stenosis of the visualized portion of the aorta. Conventional 3 vessel aortic branching pattern. The visualized proximal subclavian arteries are widely patent. RIGHT CAROTID SYSTEM: Normal without aneurysm, dissection or stenosis. LEFT CAROTID SYSTEM: Normal without aneurysm, dissection or stenosis. VERTEBRAL ARTERIES: Right dominant configuration. Both origins are clearly patent. Atherosclerotic calcification of the right greater than left V3 and V4 segments. CTA HEAD FINDINGS POSTERIOR CIRCULATION: --Vertebral arteries: Atherosclerotic irregularity of both V4 segments --Inferior cerebellar arteries: Normal. --Basilar artery: Proximal atherosclerotic irregularity without stenosis. --Superior cerebellar arteries: Normal. --Posterior cerebral arteries (PCA): Normal. ANTERIOR CIRCULATION: --Intracranial internal carotid arteries: Atherosclerotic calcification of the internal  carotid arteries at the skull base without hemodynamically significant stenosis. --Anterior cerebral arteries (ACA): Normal. Both A1 segments are present. Patent anterior communicating artery (a-comm). --Middle cerebral arteries (MCA): Normal. VENOUS SINUSES: As permitted by contrast timing, patent. ANATOMIC  VARIANTS: None Review of the MIP images confirms the above findings. IMPRESSION: 1. No emergent large vessel occlusion or hemodynamically significant stenosis of the head or neck. 2. Anterior left frontal lobe encephalomalacia and findings of chronic ischemic microangiopathy. Electronically Signed   By: Deatra Robinson M.D.   On: 10/03/2022 02:01   DG Chest Portable 1 View  Result Date: 10/02/2022 CLINICAL DATA:  Chest pain. EXAM: PORTABLE CHEST 1 VIEW COMPARISON:  08/22/2022. FINDINGS: Implantable subcutaneous loop recorder projects over the left heart border. Stable postoperative changes of median sternotomy, CABG and TAVR. Unchanged volume loss in the right hemithorax with rightward mediastinal shift. No consolidation or pulmonary edema. No pleural effusion or pneumothorax. IMPRESSION: No evidence of acute cardiopulmonary disease. Electronically Signed   By: Orvan Falconer M.D.   On: 10/02/2022 20:37   CUP PACEART REMOTE DEVICE CHECK  Result Date: 10/02/2022 ILR summary report received. Battery status OK. Normal device function. No new symptom, tachy, brady, or pause episodes.  Presenting AF ongoing from 4/29, not always good rate control, burden 2.6%, Eliquis per prior report.  Monthly summary reports and ROV/PRN LA, CVRS   EKG: I have personally reviewed EKG: a. Fib rate 111  Assessment/Plan Principal Problem:   Labyrinthitis Active Problems:   Acute on chronic combined systolic and diastolic CHF (congestive heart failure) (HCC)   Controlled type 2 diabetes mellitus with stage 3 chronic kidney disease, without long-term current use of insulin (HCC)   Essential hypertension   Renal  insufficiency   Paroxysmal atrial fibrillation (HCC)    Assessment and Plan: * Labyrinthitis Patient with 1 week of intermittent dizziness and imbalance w/o worse hearing loss. Somewhat positional. No focal neuro findings.  Plan Meclizine 25 mg bid    Acute on chronic combined systolic and diastolic CHF (congestive heart failure) (HCC) Last ECHO 05/27/22 with EF 40-45% and grade III DD. No report of SOB. Lungs clear.   Plan Continue home regimen  Paroxysmal atrial fibrillation (HCC) Patient on BB and eliquis. His rate has been up at admission but is in the 100 range at admission exam.  Plan Continue home regimen  No indication for telemetry for stable problem  Renal insufficiency Chronic problem with Cr at baseline  Plan Avoid nephrotoxic drugs  Essential hypertension BP well controlled  Plan Continue home meds  Controlled type 2 diabetes mellitus with stage 3 chronic kidney disease, without long-term current use of insulin (HCC) Patient has been stable on farigxa and truliccity 3 mg weekly. Last A1C 07/1022 8.4%  Plan Continue home regimen       DVT prophylaxis: Eliquis Code Status: Full Code Family Communication: deferred due to hour  Disposition Plan: home when stable  Consults called: none   Admission status: Observation, Med-Surg   Illene Regulus, MD [facility] 10/03/2022, 4:26 AM

## 2022-10-03 NOTE — Assessment & Plan Note (Addendum)
Patient orthostatic.  Hold Lasix and cut back on Toprol dose.  Hold Altace.

## 2022-10-03 NOTE — ED Provider Notes (Signed)
[facility] Provider Note    Event Date/Time   First MD Initiated Contact with Patient 10/03/22 0022     (approximate)   History   Abnormal Labs   HPI  Thomas Moore is a 67 y.o. male who presents to the ED for evaluation of Abnormal Labs   I reviewed medical DC summary from 2/12.  History of combined CHF, A-fib on Xarelto, CAD s/p CABG. HTN and DM.  He was admitted for an NSTEMI.  Xarelto, Brilinta and aspirin.    Patient presents to the ED for evaluation of multiple episodes of dizziness in the past week.  He reports 4-5 episodes intermittently over the past week or so with profound dizziness and vertigo with just standing or simple tasks.  He reports he does not typically experience the symptoms.  No fall to the ground/trauma or syncope, but has had to quickly sit down and crawl due to the severity of the symptoms.   Physical Exam   Triage Vital Signs: ED Triage Vitals [10/02/22 1945]  Enc Vitals Group     BP 134/79     Pulse Rate 97     Resp 18     Temp 97.7 F (36.5 C)     Temp Source Oral     SpO2 97 %     Weight 139 lb (63 kg)     Height 5\' 3"  (1.6 m)     Head Circumference      Peak Flow      Pain Score      Pain Loc      Pain Edu?      Excl. in GC?     Most recent vital signs: Vitals:   10/02/22 1945 10/02/22 2330  BP: 134/79 133/72  Pulse: 97 75  Resp: 18 20  Temp: 97.7 F (36.5 C) 98.1 F (36.7 C)  SpO2: 97% 100%    General: Awake, no distress.  Very hard of hearing CV:  Good peripheral perfusion.  Resp:  Normal effort.  Abd:  No distention.  MSK:  No deformity noted.  Neuro:  No focal deficits appreciated. Other:     ED Results / Procedures / Treatments   Labs (all labs ordered are listed, but only abnormal results are displayed) Labs Reviewed  CBC WITH DIFFERENTIAL/PLATELET - Abnormal; Notable for the following components:      Result Value   RBC 3.95 (*)    Hemoglobin 10.1 (*)    HCT 32.9 (*)    MCH  25.6 (*)    Platelets 141 (*)    All other components within normal limits  COMPREHENSIVE METABOLIC PANEL - Abnormal; Notable for the following components:   Glucose, Bld 229 (*)    BUN 28 (*)    Creatinine, Ser 1.55 (*)    GFR, Estimated 49 (*)    All other components within normal limits  BRAIN NATRIURETIC PEPTIDE - Abnormal; Notable for the following components:   B Natriuretic Peptide 531.2 (*)    All other components within normal limits  TROPONIN I (HIGH SENSITIVITY) - Abnormal; Notable for the following components:   Troponin I (High Sensitivity) 31 (*)    All other components within normal limits  TROPONIN I (HIGH SENSITIVITY) - Abnormal; Notable for the following components:   Troponin I (High Sensitivity) 28 (*)    All other components within normal limits  URINALYSIS, ROUTINE W REFLEX MICROSCOPIC  TROPONIN I (HIGH SENSITIVITY)    EKG A-fib with  RVR with a rate of 111 bpm.  Few PVCs.  Nonspecific ST changes laterally and inferiorly without STEMI.  RADIOLOGY CXR interpreted by me without evidence of acute cardiopulmonary pathology.  Official radiology report(s): CT ANGIO HEAD NECK W WO CM  Result Date: 10/03/2022 CLINICAL DATA:  Dizziness EXAM: CT ANGIOGRAPHY HEAD AND NECK WITH AND WITHOUT CONTRAST TECHNIQUE: Multidetector CT imaging of the head and neck was performed using the standard protocol during bolus administration of intravenous contrast. Multiplanar CT image reconstructions and MIPs were obtained to evaluate the vascular anatomy. Carotid stenosis measurements (when applicable) are obtained utilizing NASCET criteria, using the distal internal carotid diameter as the denominator. RADIATION DOSE REDUCTION: This exam was performed according to the departmental dose-optimization program which includes automated exposure control, adjustment of the mA and/or kV according to patient size and/or use of iterative reconstruction technique. CONTRAST:  75mL OMNIPAQUE IOHEXOL 350  MG/ML SOLN COMPARISON:  None Available. FINDINGS: CT HEAD FINDINGS Brain: There is no mass, hemorrhage or extra-axial collection. The size and configuration of the ventricles and extra-axial CSF spaces are normal. Anterior left frontal lobe encephalomalacia. There is hypoattenuation of the periventricular white matter, most commonly indicating chronic ischemic microangiopathy. Skull: The visualized skull base, calvarium and extracranial soft tissues are normal. Sinuses/Orbits: No fluid levels or advanced mucosal thickening of the visualized paranasal sinuses. No mastoid or middle ear effusion. The orbits are normal. CTA NECK FINDINGS SKELETON: There is no bony spinal canal stenosis. No lytic or blastic lesion. OTHER NECK: Normal pharynx, larynx and major salivary glands. No cervical lymphadenopathy. Unremarkable thyroid gland. UPPER CHEST: No pneumothorax or pleural effusion. No nodules or masses. AORTIC ARCH: There is calcific atherosclerosis of the aortic arch. There is no aneurysm, dissection or hemodynamically significant stenosis of the visualized portion of the aorta. Conventional 3 vessel aortic branching pattern. The visualized proximal subclavian arteries are widely patent. RIGHT CAROTID SYSTEM: Normal without aneurysm, dissection or stenosis. LEFT CAROTID SYSTEM: Normal without aneurysm, dissection or stenosis. VERTEBRAL ARTERIES: Right dominant configuration. Both origins are clearly patent. Atherosclerotic calcification of the right greater than left V3 and V4 segments. CTA HEAD FINDINGS POSTERIOR CIRCULATION: --Vertebral arteries: Atherosclerotic irregularity of both V4 segments --Inferior cerebellar arteries: Normal. --Basilar artery: Proximal atherosclerotic irregularity without stenosis. --Superior cerebellar arteries: Normal. --Posterior cerebral arteries (PCA): Normal. ANTERIOR CIRCULATION: --Intracranial internal carotid arteries: Atherosclerotic calcification of the internal carotid arteries at  the skull base without hemodynamically significant stenosis. --Anterior cerebral arteries (ACA): Normal. Both A1 segments are present. Patent anterior communicating artery (a-comm). --Middle cerebral arteries (MCA): Normal. VENOUS SINUSES: As permitted by contrast timing, patent. ANATOMIC VARIANTS: None Review of the MIP images confirms the above findings. IMPRESSION: 1. No emergent large vessel occlusion or hemodynamically significant stenosis of the head or neck. 2. Anterior left frontal lobe encephalomalacia and findings of chronic ischemic microangiopathy. Electronically Signed   By: Deatra Robinson M.D.   On: 10/03/2022 02:01   DG Chest Portable 1 View  Result Date: 10/02/2022 CLINICAL DATA:  Chest pain. EXAM: PORTABLE CHEST 1 VIEW COMPARISON:  08/22/2022. FINDINGS: Implantable subcutaneous loop recorder projects over the left heart border. Stable postoperative changes of median sternotomy, CABG and TAVR. Unchanged volume loss in the right hemithorax with rightward mediastinal shift. No consolidation or pulmonary edema. No pleural effusion or pneumothorax. IMPRESSION: No evidence of acute cardiopulmonary disease. Electronically Signed   By: Orvan Falconer M.D.   On: 10/02/2022 20:37    PROCEDURES and INTERVENTIONS:  .1-3 Lead EKG Interpretation  Performed by: Katrinka Blazing,  Domingo Dimes, MD Authorized by: Delton Prairie, MD     Interpretation: normal     ECG rate:  92   ECG rate assessment: normal     Rhythm: atrial fibrillation     Ectopy: none     Conduction: normal     Medications  iohexol (OMNIPAQUE) 350 MG/ML injection 75 mL (75 mLs Intravenous Contrast Given 10/03/22 0122)     IMPRESSION / MDM / ASSESSMENT AND PLAN / ED COURSE  I reviewed the triage vital signs and the nursing notes.  Differential diagnosis includes, but is not limited to, TIA, carotid stenosis, labyrinth-itis, vasovagal episode, PE, NSTEMI  {Patient presents with symptoms of an acute illness or injury that is potentially  life-threatening.  Vasculopath presents with a few days of novel dizziness requiring medical observation admission.  He is neurologically intact and asymptomatic here.  EKG captures A-fib with RVR, but he is rate controlled on telemetry and does not require any IV cardioactive medications.  His troponins are somewhat elevated, but flat on repeat.  His BNP is fairly elevated but denies any respiratory symptoms.  He does report often having atypical symptoms, possibly symptoms related to his CHF exacerbation.  Metabolic panel with CKD at baseline.  Normocytic anemia is noted.  No bleeding symptoms.  Consult with medicine who agrees to admit for observation.      FINAL CLINICAL IMPRESSION(S) / ED DIAGNOSES   Final diagnoses:  Dizziness  Elevated troponin     Rx / DC Orders   ED Discharge Orders     None        Note:  This document was prepared using Dragon voice recognition software and may include unintentional dictation errors.   Delton Prairie, MD 10/03/22 [phone]

## 2022-10-03 NOTE — Assessment & Plan Note (Addendum)
Continue Eliquis for anticoagulation cut back on Toprol dose.

## 2022-10-03 NOTE — Assessment & Plan Note (Signed)
Troponin only borderline increased

## 2022-10-03 NOTE — Assessment & Plan Note (Addendum)
Patient on [place] and Trulicity as outpatient.  Continue Marcelline Deist here.  Creatinine 1.55 with a GFR 49.

## 2022-10-03 NOTE — Subjective & Objective (Signed)
Thomas Moore, a 67 y/o with h/o CAD with MI, most recently Feb '24, Combined CHF, h/o CVA, h/o Sz, PAF, HTN, CKD 3, DM2. He presents to [facility] ED due to intermittent episodes of severe dizziness w/o focal weakness, worse hearing, visual changes/room spinning. He has had one episode with emesis. He has not had chest pain, left arm pain (previous presenting symptom with MI), no SOB.

## 2022-10-03 NOTE — Discharge Instructions (Signed)
Transportation Agency Name: [facility] Agency Address: [address] [place], [place] [address] Phone: [phone] Email: [email or web address] Website: [email or web address] Service(s) Offered: [employer], self-sufficiency, congregate meal  program, weatherization program, Field seismologist program, emergency food assistance,  housing counseling, home ownership program, wheels-towork program. September 28, 2016 22  Agency Name: [facility] [employer] [phone]) Address: [address], [place], [place] [address] Phone: [phone] Email:  Website: [email or web address] Service(s) Offered: Transportation for general public, subscription and demand  response; Dial-a-Ride for citizens 14 years of age or older. Agency Name: Department of Social Services Address: [address] [place], [place] [address] Phone: [phone] Service(s) Offered: Child support services; child welfare services; food stamps;  Medicaid; work first family assistance; and aid with fuel,  rent, food and medicine, transportation assistance.  Agency Name: [employer]abled [employer] (DAV) Transportation  Network Address: Phone: [phone] Service(s) Offered: Transports veterans to the [facility] medical center. Call  forty-eight hours in advance and leave the name, telephone  number, date, and time of appointment. Veteran will be  contacted by the driver the day before the appointment to  arrange a pick up point   I have held your Altace and Lasix for now since your BP was soft. Discuss with Dr Dareen Piano when you can resume it on your f/u visit. Your Metoprolol was decreased to 25 mg qd

## 2022-10-04 DIAGNOSIS — I5042 Chronic combined systolic (congestive) and diastolic (congestive) heart failure: Secondary | ICD-10-CM | POA: Diagnosis not present

## 2022-10-04 DIAGNOSIS — I951 Orthostatic hypotension: Secondary | ICD-10-CM | POA: Diagnosis not present

## 2022-10-04 DIAGNOSIS — E1122 Type 2 diabetes mellitus with diabetic chronic kidney disease: Secondary | ICD-10-CM | POA: Diagnosis not present

## 2022-10-04 DIAGNOSIS — R42 Dizziness and giddiness: Secondary | ICD-10-CM | POA: Diagnosis not present

## 2022-10-04 MED ORDER — METOPROLOL SUCCINATE ER 25 MG PO TB24: 25.0000 mg | ORAL_TABLET | Freq: Every day | ORAL | Status: DC

## 2022-10-04 MED ORDER — METOPROLOL SUCCINATE ER 25 MG PO TB24: 25.0000 mg | ORAL_TABLET | Freq: Every day | ORAL | 0 refills | Status: DC

## 2022-10-04 MED ORDER — DIVALPROEX SODIUM ER 250 MG PO TB24: 250.0000 mg | ORAL_TABLET | Freq: Every day | ORAL | 0 refills | Status: DC

## 2022-10-04 NOTE — TOC Progression Note (Signed)
Transition of Care [facility]) - Progression Note    Patient Details  Name: Thomas Moore MRN: [id number] Date of Birth: 1955-10-02  [facility]) CM/SW Contact  Margarito Liner, LCSW Phone Number: 10/04/2022, 9:13 AM  Clinical Narrative:  [facility] has accepted referral for home health PT.   Expected Discharge Plan: Home w Home Health Services Barriers to Discharge: Continued Medical Work up  Expected Discharge Plan and Services     Post Acute Care Choice: Home Health Living arrangements for the past 2 months: Apartment                                       Social Determinants of Health (SDOH) Interventions SDOH Screenings   Food Insecurity: No Food Insecurity (10/03/2022)  Housing: Low Risk  (10/03/2022)  Transportation Needs: No Transportation Needs (10/03/2022)  Utilities: Not At Risk (10/03/2022)  Stress: No Stress Concern Present (02/19/2020)  Tobacco Use: Medium Risk (10/03/2022)    Readmission Risk Interventions    07/07/2021   10:40 AM  Readmission Risk Prevention Plan  Post Dischage Appt Complete  Medication Screening Complete  Transportation Screening Complete

## 2022-10-04 NOTE — Progress Notes (Signed)
Physical Therapy Treatment Patient Details Name: Thomas Moore MRN: [id number] DOB: 01-23-1956 Today's Date: 10/04/2022   History of Present Illness Pt is a 67 y/o with h/o CAD with MI, most recently Feb '24, Combined CHF, COPD, h/o CVA, h/o Sz, PAF, HTN, CKD 3, DM2. He presents to [facility] ED due to intermittent episodes of severe dizziness w/o focal weakness, worse hearing, visual changes/room spinning.    PT Comments    Pt up to chair on entry, reports to feel fine. Pt able to AMB twice around unit without issue, no symptoms like prior to coming in. Pt reports he still feels a little tired, but blames this on not being veyr mobile while admitted. Pt should be able to safely transition to home soon.     Recommendations for follow up therapy are one component of a multi-disciplinary discharge planning process, led by the attending physician.  Recommendations may be updated based on patient status, additional functional criteria and insurance authorization.  Follow Up Recommendations       Assistance Recommended at Discharge PRN  Patient can return home with the following Assist for transportation   Equipment Recommendations  None recommended by PT    Recommendations for Other Services       Precautions / Restrictions Precautions Precautions: Fall Restrictions Weight Bearing Restrictions: No     Mobility  Bed Mobility                    Transfers Overall transfer level: Independent Equipment used: None                    Ambulation/Gait   Gait Distance (Feet): 440 Feet Assistive device: None   Gait velocity: 0.56m/s         Stairs             Wheelchair Mobility    Modified Rankin (Stroke Patients Only)       Balance                                            Cognition Arousal/Alertness: Awake/alert Behavior During Therapy: WFL for tasks assessed/performed Overall Cognitive Status: Within Functional Limits for  tasks assessed                                          Exercises      General Comments        Pertinent Vitals/Pain Pain Assessment Pain Assessment: No/denies pain    Home Living                          Prior Function            PT Goals (current goals can now be found in the care plan section) Acute Rehab PT Goals Patient Stated Goal: to go home PT Goal Formulation: With patient Time For Goal Achievement: 10/17/22 Potential to Achieve Goals: Good Additional Goals Additional Goal #1: Pt will demonstrate a BERG score of at least 41 to indicate low fall risk. Progress towards PT goals: Progressing toward goals    Frequency    Min 2X/week      PT Plan Current plan remains appropriate;Discharge plan needs to be updated    Co-evaluation  AM-PAC PT "6 Clicks" Mobility   Outcome Measure  Help needed turning from your back to your side while in a flat bed without using bedrails?: None Help needed moving from lying on your back to sitting on the side of a flat bed without using bedrails?: None Help needed moving to and from a bed to a chair (including a wheelchair)?: None Help needed standing up from a chair using your arms (e.g., wheelchair or bedside chair)?: None Help needed to walk in hospital room?: None Help needed climbing 3-5 steps with a railing? : A Little 6 Click Score: 23    End of Session Equipment Utilized During Treatment: Gait belt Activity Tolerance: Patient tolerated treatment well;No increased pain Patient left: in chair;with call bell/phone within reach;with chair alarm set Nurse Communication: Mobility status PT Visit Diagnosis: Dizziness and giddiness (R42);Other abnormalities of gait and mobility (R26.89)     Time: 1610-9604 PT Time Calculation (min) (ACUTE ONLY): 30 min  Charges:  $Therapeutic Activity: 23-37 mins                    12:49 PM, 10/04/22 Rosamaria Lints, PT, DPT Physical  Therapist - [facility]  [phone] (ASCOM)    Carlyann Placide C 10/04/2022, 12:47 PM

## 2022-10-04 NOTE — TOC Transition Note (Signed)
Transition of Care [facility]) - CM/SW Discharge Note   Patient Details  Name: Thomas Moore MRN: [id number] Date of Birth: October 02, 1955  [facility]) CM/SW Contact:  Margarito Liner, LCSW Phone Number: 10/04/2022, 4:08 PM   Clinical Narrative: Patient has orders to discharge home today. Left message for [facility] liaison to notify. No further concerns. CSW signing off.    Final next level of care: Home w Home Health Services Barriers to Discharge: Barriers Resolved   Patient Goals and CMS Choice CMS Medicare.gov Compare Post Acute Care list provided to:: Patient Choice offered to / list presented to : Patient  Discharge Placement                  Patient to be transferred to facility by: Friend   Patient and family notified of of transfer: 10/04/22  Discharge Plan and Services Additional resources added to the After Visit Summary for       Post Acute Care Choice: Home Health                    HH Arranged: PT [facility] Agency: [facility] Date [facility] Agency Contacted: 10/04/22   Representative spoke with at [facility] Agency: Lorenza Chick  Social Determinants of Health (SDOH) Interventions SDOH Screenings   Food Insecurity: No Food Insecurity (10/03/2022)  Housing: Low Risk  (10/03/2022)  Transportation Needs: No Transportation Needs (10/03/2022)  Utilities: Not At Risk (10/03/2022)  Stress: No Stress Concern Present (02/19/2020)  Tobacco Use: Medium Risk (10/03/2022)     Readmission Risk Interventions    07/07/2021   10:40 AM  Readmission Risk Prevention Plan  Post Dischage Appt Complete  Medication Screening Complete  Transportation Screening Complete

## 2022-10-04 NOTE — Discharge Summary (Signed)
Physician Discharge Summary   Patient: Thomas Moore MRN: [id number] DOB: 20-Sep-1955  Admit date:     10/03/2022  Discharge date: 10/04/22  Discharge Physician: Enedina Finner   PCP: Lauro Regulus, MD   Recommendations at discharge:    I have held your Altace and Lasix for now since your BP was soft. Discuss with Dr Dareen Piano when you can resume it on your f/u visit. Your Metoprolol was decreased to 25 mg qd F/u Dr. Dareen Piano PCP on your appointment  Discharge Diagnoses: Principal Problem:   Orthostatic hypotension Active Problems:   Chronic combined systolic and diastolic congestive heart failure (HCC)   Controlled type 2 diabetes mellitus with stage 3 chronic kidney disease, without long-term current use of insulin (HCC)   Essential hypertension   Paroxysmal atrial fibrillation (HCC)   Myocardial injury  67 y/o with h/o CAD with MI, most recently Feb '24, Combined CHF, h/o CVA, h/o Sz, PAF, HTN, CKD 3, DM2. He presents to [facility] ED due to intermittent episodes of severe dizziness w/o focal weakness, worse hearing, visual changes/room spinning. He has had one episode with emesis.   Orthostatic hypotension Patient's dizziness more when he standing up.  Patient orthostatic today cut back on Toprol dose and hold Lasix today and recieved fluid bolus recheck orthostatic vitals tomorrow.  Will hold Altace also. -- Orthostatic vital signs much improved. Patient ambulated with physical therapy without any symptoms. -- Will continue to hold Lasix and maltase. Decreased dose of metoprolol to 25 mg daily. -- Patient to monitor blood pressure at home and discussed with PCP when above meds can be resumed.   Chronic combined systolic and diastolic congestive heart failure (HCC) Last EF 40 to 45%.  Holding Lasix today and giving a fluid bolus.  Decrease dose of Toprol.  Hold Altace.   Myocardial injury Troponin only borderline increased   Paroxysmal atrial fibrillation (HCC) Continue Eliquis  for anticoagulation cut back on Toprol dose.   Essential hypertension Patient orthostatic.  Hold Lasix and cut back on Toprol dose.  Hold Altace.   Controlled type 2 diabetes mellitus with stage 3 chronic kidney disease, without long-term current use of insulin (HCC) Patient on Farxiga and Trulicity as outpatient.  Continue Marcelline Deist here.  Creatinine 1.55 with a GFR 49.  Overall hemodynamically stable. Ambulated around nurses station with physical therapy. Will arrange home health PT since patient is not driving. Discussed discharge plan with patient is in agreement with it.               Consultants: none Disposition: Home health Diet recommendation:  Discharge Diet Orders (From admission, onward)     Start     Ordered   10/04/22 0000  Diet - low sodium heart healthy        10/04/22 1551           Cardiac and Carb modified diet DISCHARGE MEDICATION: Allergies as of 10/04/2022   No Known Allergies      Medication List     STOP taking these medications    amoxicillin 500 MG tablet Commonly known as: AMOXIL   furosemide 20 MG tablet Commonly known as: Lasix   omeprazole 40 MG capsule Commonly known as: PRILOSEC   ramipril 2.5 MG capsule Commonly known as: ALTACE       TAKE these medications    acetaminophen 650 MG CR tablet Commonly known as: TYLENOL Take 1,300 mg by mouth every 8 (eight) hours as needed for pain.   apixaban 5  MG Tabs tablet Commonly known as: ELIQUIS Take 1 tablet (5 mg total) by mouth 2 (two) times daily.   atorvastatin 80 MG tablet Commonly known as: LIPITOR Take 1 tablet (80 mg total) by mouth at bedtime.   CALCIUM + D PO Take 1 tablet by mouth 2 (two) times daily.   dapagliflozin propanediol 10 MG Tabs tablet Commonly known as: Farxiga Take 1 tablet (10 mg total) by mouth daily before breakfast.   divalproex 250 MG 24 hr tablet Commonly known as: DEPAKOTE ER Take 1 tablet (250 mg total) by mouth at bedtime.    Dulaglutide 3 MG/0.5ML Sopn Inject 3 mg into the skin once a week.   isosorbide mononitrate 30 MG 24 hr tablet Commonly known as: IMDUR Take 30 mg by mouth daily.   metoprolol succinate 25 MG 24 hr tablet Commonly known as: TOPROL-XL Take 1 tablet (25 mg total) by mouth daily. Start taking on: Oct 05, 2022 What changed:  medication strength how much to take   multivitamin capsule Take 1 capsule by mouth daily.   nitroGLYCERIN 0.4 MG SL tablet Commonly known as: NITROSTAT Place 0.4 mg under the tongue every 5 (five) minutes as needed for chest pain.   pantoprazole 40 MG tablet Commonly known as: PROTONIX Take 40 mg by mouth daily.        Follow-up Information     [facility] Follow up.   Specialty: Home Health Services Why: They will follow up with you for your home health physical therapy needs. Contact information: [address] [place] [address] [phone]                Filed Weights   10/02/22 1945 10/03/22 0715 10/04/22 0900  Weight: 63 kg 63 kg 63 kg     Condition at discharge: fair  The results of significant diagnostics from this hospitalization (including imaging, microbiology, ancillary and laboratory) are listed below for reference.   Imaging Studies: CT ANGIO HEAD NECK W WO CM  Result Date: 10/03/2022 CLINICAL DATA:  Dizziness EXAM: CT ANGIOGRAPHY HEAD AND NECK WITH AND WITHOUT CONTRAST TECHNIQUE: Multidetector CT imaging of the head and neck was performed using the standard protocol during bolus administration of intravenous contrast. Multiplanar CT image reconstructions and MIPs were obtained to evaluate the vascular anatomy. Carotid stenosis measurements (when applicable) are obtained utilizing NASCET criteria, using the distal internal carotid diameter as the denominator. RADIATION DOSE REDUCTION: This exam was performed according to the departmental dose-optimization program which includes automated exposure  control, adjustment of the mA and/or kV according to patient size and/or use of iterative reconstruction technique. CONTRAST:  75mL OMNIPAQUE IOHEXOL 350 MG/ML SOLN COMPARISON:  None Available. FINDINGS: CT HEAD FINDINGS Brain: There is no mass, hemorrhage or extra-axial collection. The size and configuration of the ventricles and extra-axial CSF spaces are normal. Anterior left frontal lobe encephalomalacia. There is hypoattenuation of the periventricular white matter, most commonly indicating chronic ischemic microangiopathy. Skull: The visualized skull base, calvarium and extracranial soft tissues are normal. Sinuses/Orbits: No fluid levels or advanced mucosal thickening of the visualized paranasal sinuses. No mastoid or middle ear effusion. The orbits are normal. CTA NECK FINDINGS SKELETON: There is no bony spinal canal stenosis. No lytic or blastic lesion. OTHER NECK: Normal pharynx, larynx and major salivary glands. No cervical lymphadenopathy. Unremarkable thyroid gland. UPPER CHEST: No pneumothorax or pleural effusion. No nodules or masses. AORTIC ARCH: There is calcific atherosclerosis of the aortic arch. There is no aneurysm,  dissection or hemodynamically significant stenosis of the visualized portion of the aorta. Conventional 3 vessel aortic branching pattern. The visualized proximal subclavian arteries are widely patent. RIGHT CAROTID SYSTEM: Normal without aneurysm, dissection or stenosis. LEFT CAROTID SYSTEM: Normal without aneurysm, dissection or stenosis. VERTEBRAL ARTERIES: Right dominant configuration. Both origins are clearly patent. Atherosclerotic calcification of the right greater than left V3 and V4 segments. CTA HEAD FINDINGS POSTERIOR CIRCULATION: --Vertebral arteries: Atherosclerotic irregularity of both V4 segments --Inferior cerebellar arteries: Normal. --Basilar artery: Proximal atherosclerotic irregularity without stenosis. --Superior cerebellar arteries: Normal. --Posterior cerebral  arteries (PCA): Normal. ANTERIOR CIRCULATION: --Intracranial internal carotid arteries: Atherosclerotic calcification of the internal carotid arteries at the skull base without hemodynamically significant stenosis. --Anterior cerebral arteries (ACA): Normal. Both A1 segments are present. Patent anterior communicating artery (a-comm). --Middle cerebral arteries (MCA): Normal. VENOUS SINUSES: As permitted by contrast timing, patent. ANATOMIC VARIANTS: None Review of the MIP images confirms the above findings. IMPRESSION: 1. No emergent large vessel occlusion or hemodynamically significant stenosis of the head or neck. 2. Anterior left frontal lobe encephalomalacia and findings of chronic ischemic microangiopathy. Electronically Signed   By: Deatra Robinson M.D.   On: 10/03/2022 02:01   DG Chest Portable 1 View  Result Date: 10/02/2022 CLINICAL DATA:  Chest pain. EXAM: PORTABLE CHEST 1 VIEW COMPARISON:  08/22/2022. FINDINGS: Implantable subcutaneous loop recorder projects over the left heart border. Stable postoperative changes of median sternotomy, CABG and TAVR. Unchanged volume loss in the right hemithorax with rightward mediastinal shift. No consolidation or pulmonary edema. No pleural effusion or pneumothorax. IMPRESSION: No evidence of acute cardiopulmonary disease. Electronically Signed   By: Orvan Falconer M.D.   On: 10/02/2022 20:37   CUP PACEART REMOTE DEVICE CHECK  Result Date: 10/02/2022 ILR summary report received. Battery status OK. Normal device function. No new symptom, tachy, brady, or pause episodes.  Presenting AF ongoing from 4/29, not always good rate control, burden 2.6%, Eliquis per prior report.  Monthly summary reports and ROV/PRN LA, CVRS   Microbiology: Results for orders placed or performed during the hospital encounter of 05/26/22  Resp panel by RT-PCR (RSV, Flu A&B, Covid) Anterior Nasal Swab     Status: None   Collection Time: 05/26/22  7:34 AM   Specimen: Anterior Nasal Swab   Result Value Ref Range Status   SARS Coronavirus 2 by RT PCR NEGATIVE NEGATIVE Final    Comment: (NOTE) SARS-CoV-2 target nucleic acids are NOT DETECTED.  The SARS-CoV-2 RNA is generally detectable in upper respiratory specimens during the acute phase of infection. The lowest concentration of SARS-CoV-2 viral copies this assay can detect is 138 copies/mL. A negative result does not preclude SARS-Cov-2 infection and should not be used as the sole basis for treatment or other patient management decisions. A negative result may occur with  improper specimen collection/handling, submission of specimen other than nasopharyngeal swab, presence of viral mutation(s) within the areas targeted by this assay, and inadequate number of viral copies(<138 copies/mL). A negative result must be combined with clinical observations, patient history, and epidemiological information. The expected result is Negative.  Fact Sheet for Patients:  BloggerCourse.com  Fact Sheet for Healthcare Providers:  SeriousBroker.it  This test is no t yet approved or cleared by the [place] FDA and  has been authorized for detection and/or diagnosis of SARS-CoV-2 by FDA under an Emergency Use Authorization (EUA). This EUA will remain  in effect (meaning this test can be used) for the duration of the COVID-19 declaration under Section  564(b)(1) of the Act, 21 U.S.C.section 360bbb-3(b)(1), unless the authorization is terminated  or revoked sooner.       Influenza A by PCR NEGATIVE NEGATIVE Final   Influenza B by PCR NEGATIVE NEGATIVE Final    Comment: (NOTE) The Xpert Xpress SARS-CoV-2/FLU/RSV plus assay is intended as an aid in the diagnosis of influenza from Nasopharyngeal swab specimens and should not be used as a sole basis for treatment. Nasal washings and aspirates are unacceptable for Xpert Xpress SARS-CoV-2/FLU/RSV testing.  Fact Sheet for  Patients: BloggerCourse.com  Fact Sheet for Healthcare Providers: SeriousBroker.it  This test is not yet approved or cleared by the [place] FDA and has been authorized for detection and/or diagnosis of SARS-CoV-2 by FDA under an Emergency Use Authorization (EUA). This EUA will remain in effect (meaning this test can be used) for the duration of the COVID-19 declaration under Section 564(b)(1) of the Act, 21 U.S.C. section 360bbb-3(b)(1), unless the authorization is terminated or revoked.     Resp Syncytial Virus by PCR NEGATIVE NEGATIVE Final    Comment: (NOTE) Fact Sheet for Patients: BloggerCourse.com  Fact Sheet for Healthcare Providers: SeriousBroker.it  This test is not yet approved or cleared by the [place] FDA and has been authorized for detection and/or diagnosis of SARS-CoV-2 by FDA under an Emergency Use Authorization (EUA). This EUA will remain in effect (meaning this test can be used) for the duration of the COVID-19 declaration under Section 564(b)(1) of the Act, 21 U.S.C. section 360bbb-3(b)(1), unless the authorization is terminated or revoked.  Performed at [facility], [address] [place], [place] [address]     Labs: CBC: Recent Labs  Lab 10/02/22 1948  WBC 5.9  NEUTROABS 4.0  HGB 10.1*  HCT 32.9*  MCV 83.3  PLT 141*   Basic Metabolic Panel: Recent Labs  Lab 10/02/22 1948  NA 136  K 3.8  CL 98  CO2 26  GLUCOSE 229*  BUN 28*  CREATININE 1.55*  CALCIUM 8.9   Liver Function Tests: Recent Labs  Lab 10/02/22 1948  AST 21  ALT 17  ALKPHOS 95  BILITOT 0.5  PROT 8.1  ALBUMIN 3.9    Discharge time spent: greater than 30 minutes.  Signed: Enedina Finner, MD [facility] 10/04/2022

## 2022-10-04 NOTE — Progress Notes (Signed)
Discharge instructions reviewed with the patient. Patient sent out via wheelchair with belongings and hearing aids to rides waiting care

## 2022-10-05 ENCOUNTER — Telehealth: Payer: Self-pay

## 2022-10-05 NOTE — Telephone Encounter (Addendum)
10/06/2022 - AF burden 100% ongoing.  Not always good ventricular rate control.  (Toprol is on hold since recent hospitalization).   ILR alert report received. Battery status OK. Normal device function. No new symptom, tachy, brady, or pause episodes. One ongoing AF episode. On OAC per previous reports.  AF burden is 99.5% of the time. Sent to triage.   Monthly summary reports and ROV/PRN Hassell Halim, RN, CCDS, [employer]  Patient discharged from hospital yesterday, dx: dizziness/weakness, orthostatic hypotension. Toprol decreased. Holding Lasix and Altace. Is taking Eliquis bid.   He is to follow up with his PCP after discharge.  Spoke with patient.  He is doing well now and will start with home health PT next week.   *Patient states that he is now following Dr. Welton Flakes (Cardiology in [place]) and wants him to handle his remote ILR transmission monitoring. He is to call Dr. Milta Deiters office now, set up follow up appt and have them request the transfer.  Looks like Dr. Lalla Brothers had been following him from an EP perspective due to syncope and collapse hx.

## 2022-10-05 NOTE — Progress Notes (Signed)
Carelink Summary Report / Loop Recorder 

## 2022-10-06 ENCOUNTER — Ambulatory Visit: Payer: Medicare PPO | Admitting: Cardiovascular Disease

## 2022-10-06 ENCOUNTER — Ambulatory Visit (INDEPENDENT_AMBULATORY_CARE_PROVIDER_SITE_OTHER): Payer: Self-pay | Admitting: Cardiovascular Disease

## 2022-10-06 ENCOUNTER — Encounter: Payer: Self-pay | Admitting: Cardiovascular Disease

## 2022-10-06 VITALS — BP 138/76 | HR 83 | Ht 63.0 in | Wt 140.8 lb

## 2022-10-06 DIAGNOSIS — N179 Acute kidney failure, unspecified: Secondary | ICD-10-CM

## 2022-10-06 DIAGNOSIS — I951 Orthostatic hypotension: Secondary | ICD-10-CM | POA: Diagnosis not present

## 2022-10-06 DIAGNOSIS — I252 Old myocardial infarction: Secondary | ICD-10-CM

## 2022-10-06 DIAGNOSIS — I214 Non-ST elevation (NSTEMI) myocardial infarction: Secondary | ICD-10-CM

## 2022-10-06 DIAGNOSIS — R42 Dizziness and giddiness: Secondary | ICD-10-CM

## 2022-10-06 DIAGNOSIS — I5042 Chronic combined systolic (congestive) and diastolic (congestive) heart failure: Secondary | ICD-10-CM

## 2022-10-06 DIAGNOSIS — I480 Paroxysmal atrial fibrillation: Secondary | ICD-10-CM

## 2022-10-06 DIAGNOSIS — I2510 Atherosclerotic heart disease of native coronary artery without angina pectoris: Secondary | ICD-10-CM

## 2022-10-06 MED ORDER — AMIODARONE HCL 400 MG PO TABS
400.0000 mg | ORAL_TABLET | Freq: Two times a day (BID) | ORAL | 0 refills | Status: DC
Start: 2022-10-06 — End: 2022-10-27

## 2022-10-06 NOTE — Progress Notes (Signed)
Cardiology Office Note   Date:  10/06/2022   ID:  Thomas Moore, DOB [birth year], MRN [id number]  PCP:  Lauro Regulus, MD  Cardiologist:  Adrian Blackwater, MD      History of Present Illness: Thomas Moore is a 67 y.o. male who presents for  Chief Complaint  Patient presents with   Hospitalization Follow-up    Hospital follow up    Patient came after being admitted with Episode of dizziness, and weakness.  Dizziness This is a recurrent problem. The current episode started in the past 7 days. The problem has been gradually worsening.      Past Medical History:  Diagnosis Date   CAD (coronary artery disease) of artery bypass graft    s/p CABG x 4 in 1997   CHF (congestive heart failure) (HCC)    COPD (chronic obstructive pulmonary disease) (HCC)    Coronary artery disease    CVA (cerebral vascular accident) (HCC)    Diabetes mellitus without complication (HCC)    Dysrhythmia    GERD (gastroesophageal reflux disease)    HLD (hyperlipidemia)    Hypertension    S/P TAVR (transcatheter aortic valve replacement) 07/05/2021   with Edwards 26mm S3UR via TF approach with Dr. Excell Seltzer and Dr. Laneta Simmers   Seizures [facility])      Past Surgical History:  Procedure Laterality Date   BYPASS GRAFT ANGIOGRAPHY N/A 04/25/2021   Procedure: BYPASS GRAFT ANGIOGRAPHY;  Surgeon: Armando Reichert, MD;  Location: [facility] INVASIVE CV LAB;  Service: Cardiovascular;  Laterality: N/A;   CARDIAC SURGERY     CORNEAL TRANSPLANT Right    CORONARY ARTERY BYPASS GRAFT  1997   ELECTROPHYSIOLOGY STUDY N/A 09/08/2021   Procedure: ELECTROPHYSIOLOGY STUDY;  Surgeon: Lanier Prude, MD;  Location: [facility] INVASIVE CV LAB;  Service: Cardiovascular;  Laterality: N/A;   EYE SURGERY     FLEXOR TENDON REPAIR Left 07/11/2019   Procedure: FLEXOR tenolysis  REPAIR LEFT RING FINGER with tednon repair;  Surgeon: Kennedy Bucker, MD;  Location: [facility] ORS;  Service: Orthopedics;  Laterality: Left;   INCISION AND DRAINAGE  ABSCESS Left 05/08/2019   Procedure: INCISION AND DRAINAGE ABSCESS;  Surgeon: Donato Heinz, MD;  Location: [facility] ORS;  Service: Orthopedics;  Laterality: Left;   INTRAOPERATIVE TRANSTHORACIC ECHOCARDIOGRAM N/A 07/05/2021   Procedure: INTRAOPERATIVE TRANSTHORACIC ECHOCARDIOGRAM;  Surgeon: Tonny Bollman, MD;  Location: [facility] OR;  Service: Open Heart Surgery;  Laterality: N/A;   LEFT HEART CATH AND CORONARY ANGIOGRAPHY Left 06/09/2022   Procedure: LEFT HEART CATH AND CORONARY ANGIOGRAPHY;  Surgeon: Laurier Nancy, MD;  Location: [facility] INVASIVE CV LAB;  Service: Cardiovascular;  Laterality: Left;   LEFT HEART CATH AND CORS/GRAFTS ANGIOGRAPHY N/A 10/02/2019   Procedure: LEFT HEART CATH AND CORS/GRAFTS ANGIOGRAPHY;  Surgeon: Marykay Lex, MD;  Location: [facility] INVASIVE CV LAB;  Service: Cardiovascular;  Laterality: N/A;   LEFT HEART CATH AND CORS/GRAFTS ANGIOGRAPHY N/A 07/17/2022   Procedure: LEFT HEART CATH AND CORS/GRAFTS ANGIOGRAPHY and possible PCI and stent;  Surgeon: Laurier Nancy, MD;  Location: [facility] INVASIVE CV LAB;  Service: Cardiovascular;  Laterality: N/A;   LOOP RECORDER INSERTION N/A 10/06/2019   Procedure: LOOP RECORDER INSERTION;  Surgeon: Marinus Maw, MD;  Location: [facility] INVASIVE CV LAB;  Service: Cardiovascular;  Laterality: N/A;   TRANSCATHETER AORTIC VALVE REPLACEMENT, TRANSFEMORAL N/A 07/05/2021   Procedure: TRANSCATHETER AORTIC VALVE REPLACEMENT, TRANSFEMORAL;  Surgeon: Tonny Bollman, MD;  Location: [facility] OR;  Service: Open Heart Surgery;  Laterality: N/A;  TRIGGER FINGER RELEASE Left      Current Outpatient Medications  Medication Sig Dispense Refill   acetaminophen (TYLENOL) 650 MG CR tablet Take 1,300 mg by mouth every 8 (eight) hours as needed for pain.     amiodarone (PACERONE) 400 MG tablet Take 1 tablet (400 mg total) by mouth 2 (two) times daily. 60 tablet 0   apixaban (ELIQUIS) 5 MG TABS tablet Take 1 tablet (5 mg total) by mouth 2 (two) times daily. 60 tablet 3    atorvastatin (LIPITOR) 80 MG tablet Take 1 tablet (80 mg total) by mouth at bedtime. 30 tablet 1   Calcium Citrate-Vitamin D (CALCIUM + D PO) Take 1 tablet by mouth 2 (two) times daily.     dapagliflozin propanediol (FARXIGA) 10 MG TABS tablet Take 1 tablet (10 mg total) by mouth daily before breakfast. 30 tablet 5   divalproex (DEPAKOTE ER) 250 MG 24 hr tablet Take 1 tablet (250 mg total) by mouth at bedtime. 30 tablet 0   Dulaglutide 3 MG/0.5ML SOPN Inject 3 mg into the skin once a week.     isosorbide mononitrate (IMDUR) 30 MG 24 hr tablet Take 30 mg by mouth daily.     metoprolol succinate (TOPROL-XL) 25 MG 24 hr tablet Take 1 tablet (25 mg total) by mouth daily. 30 tablet 0   Multiple Vitamin (MULTIVITAMIN) capsule Take 1 capsule by mouth daily.     nitroGLYCERIN (NITROSTAT) 0.4 MG SL tablet Place 0.4 mg under the tongue every 5 (five) minutes as needed for chest pain.     pantoprazole (PROTONIX) 40 MG tablet Take 40 mg by mouth daily.     No current facility-administered medications for this visit.   Facility-Administered Medications Ordered in Other Visits  Medication Dose Route Frequency Provider Last Rate Last Admin   sodium chloride flush (NS) 0.9 % injection 3 mL  3 mL Intravenous Q12H Scoggins, Amber, NP       sodium chloride flush (NS) 0.9 % injection 3 mL  3 mL Intravenous Q12H Scoggins, Amber, NP        Allergies:   Patient has no known allergies.    Social History:   reports that he quit smoking about 40 years ago. His smoking use included cigarettes. He has never used smokeless tobacco. He reports that he does not currently use alcohol. He reports that he does not use drugs.   Family History:  family history includes Seizures in his brother.    ROS:     Review of Systems  Constitutional: Negative.   HENT: Negative.    Eyes: Negative.   Respiratory: Negative.    Gastrointestinal: Negative.   Genitourinary: Negative.   Musculoskeletal: Negative.   Skin: Negative.    Neurological:  Positive for dizziness.  Endo/Heme/Allergies: Negative.   Psychiatric/Behavioral: Negative.    All other systems reviewed and are negative.     All other systems are reviewed and negative.    PHYSICAL EXAM: VS:  BP 138/76   Pulse 83   Ht 5\' 3"  (1.6 m)   Wt 140 lb 12.8 oz (63.9 kg)   SpO2 98%   BMI 24.94 kg/m  , BMI Body mass index is 24.94 kg/m. Last weight:  Wt Readings from Last 3 Encounters:  10/06/22 140 lb 12.8 oz (63.9 kg)  10/04/22 139 lb (63 kg)  08/22/22 138 lb 14.2 oz (63 kg)     Physical Exam Vitals reviewed.  Constitutional:      Appearance: Normal appearance. He  is normal weight.  HENT:     Head: Normocephalic.     Nose: Nose normal.     Mouth/Throat:     Mouth: Mucous membranes are moist.  Eyes:     Pupils: Pupils are equal, round, and reactive to light.  Cardiovascular:     Rate and Rhythm: Normal rate and regular rhythm.     Pulses: Normal pulses.     Heart sounds: Normal heart sounds.  Pulmonary:     Effort: Pulmonary effort is normal.  Abdominal:     General: Abdomen is flat. Bowel sounds are normal.  Musculoskeletal:        General: Normal range of motion.     Cervical back: Normal range of motion.  Skin:    General: Skin is warm.  Neurological:     General: No focal deficit present.     Mental Status: He is alert.  Psychiatric:        Mood and Affect: Mood normal.       EKG:   Recent Labs: 07/16/2022: Magnesium 2.0 10/02/2022: ALT 17; B Natriuretic Peptide 531.2; BUN 28; Creatinine, Ser 1.55; Hemoglobin 10.1; Platelets 141; Potassium 3.8; Sodium 136    Lipid Panel    Component Value Date/Time   CHOL 141 05/27/2022 0520   CHOL 143 02/25/2020 1126   TRIG 159 (H) 05/27/2022 0520   HDL 32 (L) 05/27/2022 0520   HDL 31 (L) 02/25/2020 1126   CHOLHDL 4.4 05/27/2022 0520   VLDL 32 05/27/2022 0520   LDLCALC 77 05/27/2022 0520   LDLCALC 78 02/25/2020 1126      Other studies Reviewed: Additional studies/ records  that were reviewed today include:  Review of the above records demonstrates:       No data to display            ASSESSMENT AND PLAN:    ICD-10-CM   1. Chronic combined systolic and diastolic congestive heart failure (HCC)  I50.42 MYOCARDIAL PERFUSION IMAGING    PCV ECHOCARDIOGRAM COMPLETE    amiodarone (PACERONE) 400 MG tablet    2. Coronary artery disease with hx of myocardial infarct w/o hx of CABG  I25.10 MYOCARDIAL PERFUSION IMAGING   I25.2 PCV ECHOCARDIOGRAM COMPLETE    amiodarone (PACERONE) 400 MG tablet    3. Non-ST elevation (NSTEMI) myocardial infarction (HCC)  I21.4 MYOCARDIAL PERFUSION IMAGING    PCV ECHOCARDIOGRAM COMPLETE    amiodarone (PACERONE) 400 MG tablet    4. Orthostatic hypotension  I95.1 MYOCARDIAL PERFUSION IMAGING    PCV ECHOCARDIOGRAM COMPLETE    amiodarone (PACERONE) 400 MG tablet    5. Dizziness  R42 MYOCARDIAL PERFUSION IMAGING    PCV ECHOCARDIOGRAM COMPLETE    amiodarone (PACERONE) 400 MG tablet    6. AKI (acute kidney injury) (HCC)  N17.9 MYOCARDIAL PERFUSION IMAGING    PCV ECHOCARDIOGRAM COMPLETE    amiodarone (PACERONE) 400 MG tablet    7. Paroxysmal atrial fibrillation (HCC)  I48.0 MYOCARDIAL PERFUSION IMAGING    PCV ECHOCARDIOGRAM COMPLETE    amiodarone (PACERONE) 400 MG tablet       Problem List Items Addressed This Visit       Cardiovascular and Mediastinum   Non-ST elevation (NSTEMI) myocardial infarction (HCC)   Relevant Medications   amiodarone (PACERONE) 400 MG tablet   Other Relevant Orders   MYOCARDIAL PERFUSION IMAGING   PCV ECHOCARDIOGRAM COMPLETE   Coronary artery disease with hx of myocardial infarct w/o hx of CABG   Relevant Medications   amiodarone (PACERONE) 400  MG tablet   Other Relevant Orders   MYOCARDIAL PERFUSION IMAGING   PCV ECHOCARDIOGRAM COMPLETE   Paroxysmal atrial fibrillation (HCC)    In atrial fibrillation with VR 90/min. Advise po amiodrone 400 bid. Advise echo and persantine myoview.       Relevant Medications   amiodarone (PACERONE) 400 MG tablet   Other Relevant Orders   MYOCARDIAL PERFUSION IMAGING   PCV ECHOCARDIOGRAM COMPLETE   Chronic combined systolic and diastolic congestive heart failure (HCC) - Primary    Repeat BP 110/50, lung clear and will continue to hold altace and lasix.      Relevant Medications   amiodarone (PACERONE) 400 MG tablet   Other Relevant Orders   MYOCARDIAL PERFUSION IMAGING   PCV ECHOCARDIOGRAM COMPLETE   Orthostatic hypotension   Relevant Medications   amiodarone (PACERONE) 400 MG tablet   Other Relevant Orders   MYOCARDIAL PERFUSION IMAGING   PCV ECHOCARDIOGRAM COMPLETE     Genitourinary   AKI (acute kidney injury) (HCC)   Relevant Medications   amiodarone (PACERONE) 400 MG tablet   Other Relevant Orders   MYOCARDIAL PERFUSION IMAGING   PCV ECHOCARDIOGRAM COMPLETE     Other   Dizziness    Had hypotension, and altace, lasix were held and had Metoprolol decreased to 25 mg daily. Dizziness less frequent now.      Relevant Medications   amiodarone (PACERONE) 400 MG tablet   Other Relevant Orders   MYOCARDIAL PERFUSION IMAGING   PCV ECHOCARDIOGRAM COMPLETE       Disposition:   Return in about 1 week (around 10/13/2022) for echo, stress test and f/u.    Total time spent: 30 minutes  Signed,  Adrian Blackwater, MD  10/06/2022 1:31 PM    [facility]

## 2022-10-06 NOTE — Assessment & Plan Note (Signed)
In atrial fibrillation with VR 90/min. Advise po amiodrone 400 bid. Advise echo and persantine myoview.

## 2022-10-06 NOTE — Assessment & Plan Note (Signed)
Had hypotension, and altace, lasix were held and had Metoprolol decreased to 25 mg daily. Dizziness less frequent now.

## 2022-10-06 NOTE — Patient Instructions (Signed)
Take amiodrone 400 mg twice a day, for 1 week ,then will decrease to 400 po daily once a day

## 2022-10-06 NOTE — Assessment & Plan Note (Signed)
Repeat BP 110/50, lung clear and will continue to hold altace and lasix.

## 2022-10-16 ENCOUNTER — Ambulatory Visit (INDEPENDENT_AMBULATORY_CARE_PROVIDER_SITE_OTHER): Payer: Self-pay

## 2022-10-16 DIAGNOSIS — I2510 Atherosclerotic heart disease of native coronary artery without angina pectoris: Secondary | ICD-10-CM | POA: Diagnosis not present

## 2022-10-16 DIAGNOSIS — I214 Non-ST elevation (NSTEMI) myocardial infarction: Secondary | ICD-10-CM | POA: Diagnosis not present

## 2022-10-16 DIAGNOSIS — R42 Dizziness and giddiness: Secondary | ICD-10-CM

## 2022-10-16 DIAGNOSIS — I951 Orthostatic hypotension: Secondary | ICD-10-CM | POA: Diagnosis not present

## 2022-10-16 DIAGNOSIS — I480 Paroxysmal atrial fibrillation: Secondary | ICD-10-CM

## 2022-10-16 DIAGNOSIS — I252 Old myocardial infarction: Secondary | ICD-10-CM

## 2022-10-16 DIAGNOSIS — N179 Acute kidney failure, unspecified: Secondary | ICD-10-CM

## 2022-10-16 DIAGNOSIS — I5042 Chronic combined systolic (congestive) and diastolic (congestive) heart failure: Secondary | ICD-10-CM | POA: Diagnosis not present

## 2022-10-16 MED ORDER — TECHNETIUM TC 99M SESTAMIBI GENERIC - CARDIOLITE
33.0000 | Freq: Once | INTRAVENOUS | Status: AC | PRN
Start: 2022-10-16 — End: 2022-10-16
  Administered 2022-10-16: 33 via INTRAVENOUS

## 2022-10-16 MED ORDER — TECHNETIUM TC 99M SESTAMIBI GENERIC - CARDIOLITE
9.8000 | Freq: Once | INTRAVENOUS | Status: AC | PRN
  Administered 2022-10-16: 9.8 via INTRAVENOUS

## 2022-10-20 ENCOUNTER — Ambulatory Visit (INDEPENDENT_AMBULATORY_CARE_PROVIDER_SITE_OTHER): Payer: Self-pay

## 2022-10-20 DIAGNOSIS — I480 Paroxysmal atrial fibrillation: Secondary | ICD-10-CM

## 2022-10-20 DIAGNOSIS — I351 Nonrheumatic aortic (valve) insufficiency: Secondary | ICD-10-CM

## 2022-10-20 DIAGNOSIS — I214 Non-ST elevation (NSTEMI) myocardial infarction: Secondary | ICD-10-CM

## 2022-10-20 DIAGNOSIS — I951 Orthostatic hypotension: Secondary | ICD-10-CM

## 2022-10-20 DIAGNOSIS — I252 Old myocardial infarction: Secondary | ICD-10-CM

## 2022-10-20 DIAGNOSIS — I340 Nonrheumatic mitral (valve) insufficiency: Secondary | ICD-10-CM | POA: Diagnosis not present

## 2022-10-20 DIAGNOSIS — I5042 Chronic combined systolic (congestive) and diastolic (congestive) heart failure: Secondary | ICD-10-CM

## 2022-10-20 DIAGNOSIS — N179 Acute kidney failure, unspecified: Secondary | ICD-10-CM

## 2022-10-20 DIAGNOSIS — R42 Dizziness and giddiness: Secondary | ICD-10-CM

## 2022-10-27 ENCOUNTER — Ambulatory Visit (INDEPENDENT_AMBULATORY_CARE_PROVIDER_SITE_OTHER): Payer: Self-pay | Admitting: Cardiovascular Disease

## 2022-10-27 ENCOUNTER — Encounter: Payer: Self-pay | Admitting: Cardiovascular Disease

## 2022-10-27 VITALS — BP 104/52 | HR 71 | Ht 63.0 in | Wt 138.0 lb

## 2022-10-27 DIAGNOSIS — I350 Nonrheumatic aortic (valve) stenosis: Secondary | ICD-10-CM

## 2022-10-27 DIAGNOSIS — I10 Essential (primary) hypertension: Secondary | ICD-10-CM

## 2022-10-27 DIAGNOSIS — I5042 Chronic combined systolic (congestive) and diastolic (congestive) heart failure: Secondary | ICD-10-CM | POA: Diagnosis not present

## 2022-10-27 DIAGNOSIS — I951 Orthostatic hypotension: Secondary | ICD-10-CM

## 2022-10-27 DIAGNOSIS — I252 Old myocardial infarction: Secondary | ICD-10-CM

## 2022-10-27 DIAGNOSIS — E782 Mixed hyperlipidemia: Secondary | ICD-10-CM

## 2022-10-27 DIAGNOSIS — I2510 Atherosclerotic heart disease of native coronary artery without angina pectoris: Secondary | ICD-10-CM

## 2022-10-27 DIAGNOSIS — R42 Dizziness and giddiness: Secondary | ICD-10-CM

## 2022-10-27 DIAGNOSIS — N179 Acute kidney failure, unspecified: Secondary | ICD-10-CM

## 2022-10-27 DIAGNOSIS — I480 Paroxysmal atrial fibrillation: Secondary | ICD-10-CM

## 2022-10-27 MED ORDER — AMIODARONE HCL 200 MG PO TABS
200.0000 mg | ORAL_TABLET | Freq: Every day | ORAL | 3 refills | Status: AC
Start: 2022-10-27 — End: ?

## 2022-10-27 MED ORDER — CLOPIDOGREL BISULFATE 75 MG PO TABS
75.0000 mg | ORAL_TABLET | Freq: Every day | ORAL | 11 refills | Status: AC
Start: 2022-10-27 — End: 2023-10-27

## 2022-10-27 MED ORDER — METOPROLOL SUCCINATE ER 25 MG PO TB24: 12.5000 mg | ORAL_TABLET | Freq: Every day | ORAL | 0 refills | Status: DC

## 2022-10-27 MED ORDER — CLOPIDOGREL BISULFATE 75 MG PO TABS
75.0000 mg | ORAL_TABLET | Freq: Every day | ORAL | 11 refills | Status: DC
Start: 2022-10-27 — End: 2022-10-27

## 2022-10-27 MED ORDER — AMIODARONE HCL 200 MG PO TABS
200.0000 mg | ORAL_TABLET | Freq: Every day | ORAL | 3 refills | Status: DC
Start: 2022-10-27 — End: 2022-10-27

## 2022-10-27 NOTE — Assessment & Plan Note (Signed)
Echo 10/2022 EF improved. Denies shortness of breath.

## 2022-10-27 NOTE — Assessment & Plan Note (Signed)
In sinus rhythm during stress test. Decrease amiodarone. Decrease metoprolol.

## 2022-10-27 NOTE — Patient Instructions (Addendum)
Change amiodarone to 200 mg daily Decrease metoprolol to 12.5 mg daily (1/2 of 25 mg) Eliqius is the blood thinner. Stop 3 days before dental work. Restart day after.

## 2022-10-27 NOTE — Progress Notes (Signed)
Cardiology Office Note   Date:  10/27/2022   ID:  Thomas Moore, DOB 27-Jul-1955, MRN [id number]  PCP:  Lauro Regulus, MD  Cardiologist:  Adrian Blackwater, MD      History of Present Illness: Thomas Moore is a 67 y.o. male who presents for  Chief Complaint  Patient presents with   Follow-up    NST & ECHO Results    Patient in office for results of echo and stress test. Denies chest pain, left arm, shortness of breath. Complains of dizziness.    Past Medical History:  Diagnosis Date   CAD (coronary artery disease) of artery bypass graft    s/p CABG x 4 in 1997   CHF (congestive heart failure) (HCC)    COPD (chronic obstructive pulmonary disease) (HCC)    Coronary artery disease    CVA (cerebral vascular accident) (HCC)    Diabetes mellitus without complication (HCC)    Dysrhythmia    GERD (gastroesophageal reflux disease)    HLD (hyperlipidemia)    Hypertension    S/P TAVR (transcatheter aortic valve replacement) 07/05/2021   with Edwards 26mm S3UR via TF approach with Dr. Excell Seltzer and Dr. Laneta Simmers   Seizures [facility])      Past Surgical History:  Procedure Laterality Date   BYPASS GRAFT ANGIOGRAPHY N/A 04/25/2021   Procedure: BYPASS GRAFT ANGIOGRAPHY;  Surgeon: Armando Reichert, MD;  Location: [facility] INVASIVE CV LAB;  Service: Cardiovascular;  Laterality: N/A;   CARDIAC SURGERY     CORNEAL TRANSPLANT Right    CORONARY ARTERY BYPASS GRAFT  1997   ELECTROPHYSIOLOGY STUDY N/A 09/08/2021   Procedure: ELECTROPHYSIOLOGY STUDY;  Surgeon: Lanier Prude, MD;  Location: [facility] INVASIVE CV LAB;  Service: Cardiovascular;  Laterality: N/A;   EYE SURGERY     FLEXOR TENDON REPAIR Left 07/11/2019   Procedure: FLEXOR tenolysis  REPAIR LEFT RING FINGER with tednon repair;  Surgeon: Kennedy Bucker, MD;  Location: [facility] ORS;  Service: Orthopedics;  Laterality: Left;   INCISION AND DRAINAGE ABSCESS Left 05/08/2019   Procedure: INCISION AND DRAINAGE ABSCESS;  Surgeon: Donato Heinz,  MD;  Location: [facility] ORS;  Service: Orthopedics;  Laterality: Left;   INTRAOPERATIVE TRANSTHORACIC ECHOCARDIOGRAM N/A 07/05/2021   Procedure: INTRAOPERATIVE TRANSTHORACIC ECHOCARDIOGRAM;  Surgeon: Tonny Bollman, MD;  Location: [facility] OR;  Service: Open Heart Surgery;  Laterality: N/A;   LEFT HEART CATH AND CORONARY ANGIOGRAPHY Left 06/09/2022   Procedure: LEFT HEART CATH AND CORONARY ANGIOGRAPHY;  Surgeon: Laurier Nancy, MD;  Location: [facility] INVASIVE CV LAB;  Service: Cardiovascular;  Laterality: Left;   LEFT HEART CATH AND CORS/GRAFTS ANGIOGRAPHY N/A 10/02/2019   Procedure: LEFT HEART CATH AND CORS/GRAFTS ANGIOGRAPHY;  Surgeon: Marykay Lex, MD;  Location: [facility] INVASIVE CV LAB;  Service: Cardiovascular;  Laterality: N/A;   LEFT HEART CATH AND CORS/GRAFTS ANGIOGRAPHY N/A 07/17/2022   Procedure: LEFT HEART CATH AND CORS/GRAFTS ANGIOGRAPHY and possible PCI and stent;  Surgeon: Laurier Nancy, MD;  Location: [facility] INVASIVE CV LAB;  Service: Cardiovascular;  Laterality: N/A;   LOOP RECORDER INSERTION N/A 10/06/2019   Procedure: LOOP RECORDER INSERTION;  Surgeon: Marinus Maw, MD;  Location: [facility] INVASIVE CV LAB;  Service: Cardiovascular;  Laterality: N/A;   TRANSCATHETER AORTIC VALVE REPLACEMENT, TRANSFEMORAL N/A 07/05/2021   Procedure: TRANSCATHETER AORTIC VALVE REPLACEMENT, TRANSFEMORAL;  Surgeon: Tonny Bollman, MD;  Location: [facility] OR;  Service: Open Heart Surgery;  Laterality: N/A;   TRIGGER FINGER RELEASE Left      Current Outpatient Medications  Medication Sig Dispense Refill   acetaminophen (TYLENOL) 650 MG CR tablet Take 1,300 mg by mouth every 8 (eight) hours as needed for pain.     amiodarone (PACERONE) 200 MG tablet Take 1 tablet (200 mg total) by mouth daily. 30 tablet 3   apixaban (ELIQUIS) 5 MG TABS tablet Take 1 tablet (5 mg total) by mouth 2 (two) times daily. 60 tablet 3   atorvastatin (LIPITOR) 80 MG tablet Take 1 tablet (80 mg total) by mouth at bedtime. 30 tablet 1   Calcium  Citrate-Vitamin D (CALCIUM + D PO) Take 1 tablet by mouth 2 (two) times daily.     clopidogrel (PLAVIX) 75 MG tablet Take 1 tablet (75 mg total) by mouth daily. 30 tablet 11   dapagliflozin propanediol (FARXIGA) 10 MG TABS tablet Take 1 tablet (10 mg total) by mouth daily before breakfast. 30 tablet 5   divalproex (DEPAKOTE ER) 250 MG 24 hr tablet Take 1 tablet (250 mg total) by mouth at bedtime. 30 tablet 0   Dulaglutide 3 MG/0.5ML SOPN Inject 3 mg into the skin once a week.     isosorbide mononitrate (IMDUR) 30 MG 24 hr tablet Take 30 mg by mouth daily.     metoprolol succinate (TOPROL-XL) 25 MG 24 hr tablet Take 0.5 tablets (12.5 mg total) by mouth daily. 30 tablet 0   Multiple Vitamin (MULTIVITAMIN) capsule Take 1 capsule by mouth daily.     nitroGLYCERIN (NITROSTAT) 0.4 MG SL tablet Place 0.4 mg under the tongue every 5 (five) minutes as needed for chest pain.     pantoprazole (PROTONIX) 40 MG tablet Take 40 mg by mouth daily.     No current facility-administered medications for this visit.    Allergies:   Patient has no known allergies.    Social History:   reports that he quit smoking about 40 years ago. His smoking use included cigarettes. He has never used smokeless tobacco. He reports that he does not currently use alcohol. He reports that he does not use drugs.   Family History:  family history includes Seizures in his brother.    ROS:     Review of Systems  Constitutional: Negative.   HENT: Negative.    Eyes: Negative.   Respiratory: Negative.    Cardiovascular: Negative.   Gastrointestinal: Negative.   Genitourinary: Negative.   Musculoskeletal: Negative.   Skin: Negative.   Neurological:  Positive for dizziness.  Endo/Heme/Allergies: Negative.   Psychiatric/Behavioral: Negative.    All other systems reviewed and are negative.    All other systems are reviewed and negative.    PHYSICAL EXAM: VS:  BP (!) 104/52   Pulse 71   Ht 5\' 3"  (1.6 m)   Wt 138 lb (62.6  kg)   SpO2 97%   BMI 24.45 kg/m  , BMI Body mass index is 24.45 kg/m. Last weight:  Wt Readings from Last 3 Encounters:  10/27/22 138 lb (62.6 kg)  10/06/22 140 lb 12.8 oz (63.9 kg)  10/04/22 139 lb (63 kg)    Physical Exam Vitals reviewed.  Constitutional:      Appearance: Normal appearance. He is normal weight.  HENT:     Head: Normocephalic.     Nose: Nose normal.     Mouth/Throat:     Mouth: Mucous membranes are moist.  Eyes:     Pupils: Pupils are equal, round, and reactive to light.  Cardiovascular:     Rate and Rhythm: Normal rate and regular rhythm.  Pulses: Normal pulses.     Heart sounds: Normal heart sounds.  Pulmonary:     Effort: Pulmonary effort is normal.  Abdominal:     General: Abdomen is flat. Bowel sounds are normal.  Musculoskeletal:        General: Normal range of motion.     Cervical back: Normal range of motion.  Skin:    General: Skin is warm.  Neurological:     General: No focal deficit present.     Mental Status: He is alert.  Psychiatric:        Mood and Affect: Mood normal.     EKG: none today  Recent Labs: 07/16/2022: Magnesium 2.0 10/02/2022: ALT 17; B Natriuretic Peptide 531.2; BUN 28; Creatinine, Ser 1.55; Hemoglobin 10.1; Platelets 141; Potassium 3.8; Sodium 136    Lipid Panel    Component Value Date/Time   CHOL 141 05/27/2022 0520   CHOL 143 02/25/2020 1126   TRIG 159 (H) 05/27/2022 0520   HDL 32 (L) 05/27/2022 0520   HDL 31 (L) 02/25/2020 1126   CHOLHDL 4.4 05/27/2022 0520   VLDL 32 05/27/2022 0520   LDLCALC 77 05/27/2022 0520   LDLCALC 78 02/25/2020 1126     Other studies Reviewed: echo, stress test 10/2022   ASSESSMENT AND PLAN:    ICD-10-CM   1. Chronic combined systolic and diastolic congestive heart failure (HCC)  I50.42 amiodarone (PACERONE) 200 MG tablet    DISCONTINUED: amiodarone (PACERONE) 200 MG tablet    2. Coronary artery disease with hx of myocardial infarct w/o hx of CABG  I25.10 amiodarone  (PACERONE) 200 MG tablet   I25.2 clopidogrel (PLAVIX) 75 MG tablet    DISCONTINUED: amiodarone (PACERONE) 200 MG tablet    DISCONTINUED: clopidogrel (PLAVIX) 75 MG tablet    3. Essential hypertension  I10     4. Paroxysmal atrial fibrillation (HCC)  I48.0 amiodarone (PACERONE) 200 MG tablet    DISCONTINUED: amiodarone (PACERONE) 200 MG tablet    5. Severe aortic stenosis  I35.0     6. Mixed hyperlipidemia  E78.2     7. Orthostatic hypotension  I95.1 amiodarone (PACERONE) 200 MG tablet    DISCONTINUED: amiodarone (PACERONE) 200 MG tablet    8. Dizziness  R42 amiodarone (PACERONE) 200 MG tablet    DISCONTINUED: amiodarone (PACERONE) 200 MG tablet    9. AKI (acute kidney injury) (HCC)  N17.9 amiodarone (PACERONE) 200 MG tablet    DISCONTINUED: amiodarone (PACERONE) 200 MG tablet       Problem List Items Addressed This Visit       Cardiovascular and Mediastinum   Essential hypertension   Relevant Medications   amiodarone (PACERONE) 200 MG tablet   metoprolol succinate (TOPROL-XL) 25 MG 24 hr tablet   Coronary artery disease with hx of myocardial infarct w/o hx of CABG    Patient doing well. Denies chest pain, left arm pain, shortness of breath. Stress test 10/2022 Ischemia in the LAD territory with mild LV dysfunction, dilated LV. Advise CCTA. LHC SVG to PDA is occluded which was probably the culprit for the non-STEMI. Native RCA is 100% occluded proximally but has bridging collaterals from proximal to distal RCA. SVG to OM and LIMA to the LAD are patent.      Relevant Medications   amiodarone (PACERONE) 200 MG tablet   clopidogrel (PLAVIX) 75 MG tablet   metoprolol succinate (TOPROL-XL) 25 MG 24 hr tablet   Severe aortic stenosis   Relevant Medications   amiodarone (PACERONE) 200 MG tablet  metoprolol succinate (TOPROL-XL) 25 MG 24 hr tablet   Paroxysmal atrial fibrillation (HCC)    In sinus rhythm during stress test. Decrease amiodarone. Decrease metoprolol.       Relevant Medications   amiodarone (PACERONE) 200 MG tablet   metoprolol succinate (TOPROL-XL) 25 MG 24 hr tablet   Chronic combined systolic and diastolic congestive heart failure (HCC) - Primary    Echo 10/2022 EF improved. Denies shortness of breath.       Relevant Medications   amiodarone (PACERONE) 200 MG tablet   metoprolol succinate (TOPROL-XL) 25 MG 24 hr tablet   Orthostatic hypotension   Relevant Medications   amiodarone (PACERONE) 200 MG tablet   metoprolol succinate (TOPROL-XL) 25 MG 24 hr tablet     Genitourinary   AKI (acute kidney injury) (HCC)   Relevant Medications   amiodarone (PACERONE) 200 MG tablet     Other   HLD (hyperlipidemia)   Relevant Medications   amiodarone (PACERONE) 200 MG tablet   metoprolol succinate (TOPROL-XL) 25 MG 24 hr tablet   Dizziness   Relevant Medications   amiodarone (PACERONE) 200 MG tablet     Disposition:   Return in about 3 months (around 01/27/2023).    Total time spent: 30 minutes  Signed,  Adrian Blackwater, MD  10/27/2022 1:49 PM    [facility]

## 2022-10-27 NOTE — Assessment & Plan Note (Signed)
Patient doing well. Denies chest pain, left arm pain, shortness of breath. Stress test 10/2022 Ischemia in the LAD territory with mild LV dysfunction, dilated LV. Advise CCTA. LHC SVG to PDA is occluded which was probably the culprit for the non-STEMI. Native RCA is 100% occluded proximally but has bridging collaterals from proximal to distal RCA. SVG to OM and LIMA to the LAD are patent.

## 2022-11-02 NOTE — Progress Notes (Signed)
Carelink Summary Report / Loop Recorder 

## 2022-11-03 ENCOUNTER — Ambulatory Visit (INDEPENDENT_AMBULATORY_CARE_PROVIDER_SITE_OTHER): Payer: Medicare PPO

## 2022-11-03 DIAGNOSIS — R55 Syncope and collapse: Secondary | ICD-10-CM

## 2022-11-06 LAB — CUP PACEART REMOTE DEVICE CHECK
Date Time Interrogation Session: 20240531230107
Implantable Pulse Generator Implant Date: 20210503

## 2022-11-08 ENCOUNTER — Other Ambulatory Visit: Payer: Self-pay

## 2022-11-08 ENCOUNTER — Emergency Department
Admission: EM | Admit: 2022-11-08 | Discharge: 2022-11-08 | Disposition: A | Discharge status: Home / Self Care | Payer: Medicare PPO | Attending: Emergency Medicine | Admitting: Emergency Medicine

## 2022-11-08 ENCOUNTER — Emergency Department: Payer: Medicare PPO

## 2022-11-08 DIAGNOSIS — J90 Pleural effusion, not elsewhere classified: Secondary | ICD-10-CM

## 2022-11-08 DIAGNOSIS — Z951 Presence of aortocoronary bypass graft: Secondary | ICD-10-CM | POA: Diagnosis not present

## 2022-11-08 DIAGNOSIS — R0602 Shortness of breath: Secondary | ICD-10-CM | POA: Diagnosis not present

## 2022-11-08 LAB — BASIC METABOLIC PANEL
Anion gap: 9 (ref 5–15)
BUN: 23 mg/dL (ref 8–23)
CO2: 25 mmol/L (ref 22–32)
Calcium: 8.9 mg/dL (ref 8.9–10.3)
Chloride: 103 mmol/L (ref 98–111)
Creatinine, Ser: 1.36 mg/dL — ABNORMAL HIGH (ref 0.61–1.24)
GFR, Estimated: 57 mL/min — ABNORMAL LOW (ref >60)
Glucose, Bld: 124 mg/dL — ABNORMAL HIGH (ref 70–99)
Potassium: 4.1 mmol/L (ref 3.5–5.1)
Sodium: 137 mmol/L (ref 135–145)

## 2022-11-08 LAB — CBC
HCT: 31.2 % — ABNORMAL LOW (ref 39.0–52.0)
Hemoglobin: 9.2 g/dL — ABNORMAL LOW (ref 13.0–17.0)
MCH: 23.7 pg — ABNORMAL LOW (ref 26.0–34.0)
MCHC: 29.5 g/dL — ABNORMAL LOW (ref 30.0–36.0)
MCV: 80.2 fL (ref 80.0–100.0)
Platelets: 199 10*3/uL (ref 150–400)
RBC: 3.89 MIL/uL — ABNORMAL LOW (ref 4.22–5.81)
RDW: 17.4 % — ABNORMAL HIGH (ref 11.5–15.5)
WBC: 5.5 10*3/uL (ref 4.0–10.5)
nRBC: 0 % (ref 0.0–0.2)

## 2022-11-08 LAB — TROPONIN I (HIGH SENSITIVITY)
Troponin I (High Sensitivity): 16 ng/L (ref <18)
Troponin I (High Sensitivity): 18 ng/L — ABNORMAL HIGH (ref <18)

## 2022-11-08 MED ORDER — SODIUM CHLORIDE 0.9 % IV SOLN
1.0000 g | Freq: Once | INTRAVENOUS | Status: AC
  Administered 2022-11-08: 1 g via INTRAVENOUS
  Filled 2022-11-08: qty 10

## 2022-11-08 MED ORDER — SODIUM CHLORIDE 0.9 % IV SOLN
500.0000 mg | Freq: Once | INTRAVENOUS | Status: AC
  Administered 2022-11-08: 500 mg via INTRAVENOUS
  Filled 2022-11-08: qty 5

## 2022-11-08 MED ORDER — FUROSEMIDE 10 MG/ML IJ SOLN
40.0000 mg | Freq: Once | INTRAMUSCULAR | Status: AC
  Administered 2022-11-08: 40 mg via INTRAVENOUS
  Filled 2022-11-08: qty 4

## 2022-11-08 MED ORDER — AZITHROMYCIN 250 MG PO TABS: ORAL_TABLET | ORAL | 0 refills | Status: AC

## 2022-11-08 NOTE — ED Triage Notes (Signed)
First Nurse Note:  Pt via [facility] from home. Pt c/o SOB, states that it started yesterday that is getting worse. EKG was unremarkable per EMS. Denies lung hx. Pt is A&Ox4 and NAD 95% on RA 81 HR  141/65 BP

## 2022-11-08 NOTE — ED Triage Notes (Signed)
Pt to ED [facility] from home for shob for the past few days. Reports shob with exertion. Denies pain. RR even and unlabored, speaking in complete sentences.

## 2022-11-08 NOTE — ED Provider Notes (Signed)
[facility] Provider Note    Event Date/Time   First MD Initiated Contact with Patient 11/08/22 1724     (approximate)   History   Shortness of Breath   HPI  Thomas Moore is a 67 y.o. male   who presents to the emergency department today because of concerns for shortness of breath.  Has been present for the past few days progressively getting worse.  Notices it primarily with exertion.  The patient denies any chest pain.  Denies any cough.  Denies similar symptoms in the past.  No recent travel.  No known sick contacts.  Patient does have significant heart history including CABG.       Physical Exam   Triage Vital Signs: ED Triage Vitals  Enc Vitals Group     BP 11/08/22 1353 132/62     Pulse Rate 11/08/22 1353 74     Resp 11/08/22 1353 18     Temp 11/08/22 1353 98.5 F (36.9 C)     Temp Source 11/08/22 1353 Oral     SpO2 11/08/22 1353 93 %     Weight 11/08/22 1359 136 lb 11 oz (62 kg)     Height 11/08/22 1359 5\' 3"  (1.6 m)     Head Circumference --      Peak Flow --      Pain Score 11/08/22 1359 0     Pain Loc --      Pain Edu? --      Excl. in GC? --     Most recent vital signs: Vitals:   11/08/22 1800 11/08/22 1830  BP: 134/63 (!) 150/70  Pulse: 73 93  Resp: (!) 21 20  Temp:  98.6 F (37 C)  SpO2: 93% 95%   General: Awake, alert, oriented. CV:  Good peripheral perfusion. Regular rate and rhythm. Resp:  Normal effort. Lungs clear. Abd:  No distention.    ED Results / Procedures / Treatments   Labs (all labs ordered are listed, but only abnormal results are displayed) Labs Reviewed  BASIC METABOLIC PANEL - Abnormal; Notable for the following components:      Result Value   Glucose, Bld 124 (*)    Creatinine, Ser 1.36 (*)    GFR, Estimated 57 (*)    All other components within normal limits  CBC - Abnormal; Notable for the following components:   RBC 3.89 (*)    Hemoglobin 9.2 (*)    HCT 31.2 (*)    MCH 23.7 (*)     MCHC 29.5 (*)    RDW 17.4 (*)    All other components within normal limits  TROPONIN I (HIGH SENSITIVITY)  TROPONIN I (HIGH SENSITIVITY)     EKG  I, Phineas Semen, attending physician, personally viewed and interpreted this EKG  EKG Time: 1357 Rate: 76 Rhythm: normal sinus rhythm Axis: normal Intervals: qtc 443 QRS: narrow, q waves v1 ST changes: no st elevation Impression: abnormal ekg   RADIOLOGY I independently interpreted and visualized the CXR. My interpretation: Bilateral pleural effusions Radiology interpretation:  IMPRESSION:  Developing bilateral pleural effusions and adjacent lung opacities,  left-greater-than-right. Infiltrates possible. Recommend follow-up.      PROCEDURES:  Critical Care performed: No    MEDICATIONS ORDERED IN ED: Medications  azithromycin (ZITHROMAX) 500 mg in sodium chloride 0.9 % 250 mL IVPB (500 mg Intravenous New Bag/Given 11/08/22 1901)  cefTRIAXone (ROCEPHIN) 1 g in sodium chloride 0.9 % 100 mL IVPB (0 g Intravenous Stopped  11/08/22 1854)  furosemide (LASIX) injection 40 mg (40 mg Intravenous Given 11/08/22 1821)     IMPRESSION / MDM / ASSESSMENT AND PLAN / ED COURSE  I reviewed the triage vital signs and the nursing notes.                              Differential diagnosis includes, but is not limited to, pneumonia, ACS, COPD, CHF, PE  Patient's presentation is most consistent with acute presentation with potential threat to life or bodily function.   The patient is on the cardiac monitor to evaluate for evidence of arrhythmia and/or significant heart rate changes.  Patient presented to the emergency department today because of concerns for shortness of breath.  On exam patient not hypoxic.  Lungs clear.  Chest x-ray is concerning for bilateral pleural effusions and associated opacities.  Did consult hospitalist for admission.  Dr. Sherryll Burger with the hospitalist service declined admission at this time. He did discuss with the  patient's cardiologist who can see him in the hospital tomorrow. Discussed revised plan with patient.       FINAL CLINICAL IMPRESSION(S) / ED DIAGNOSES   Final diagnoses:  SOB (shortness of breath)  Pleural effusion     Note:  This document was prepared using Dragon voice recognition software and may include unintentional dictation errors.    Phineas Semen, MD 11/08/22 2105

## 2022-11-08 NOTE — ED Notes (Signed)
Pt provided discharge instructions and prescription information. Pt was given the opportunity to ask questions and questions were answered.   

## 2022-11-08 NOTE — Final Progress Note (Signed)
EDP requested evaluation.  Patient's vitals are stable.  He is saturating 95% on room air.  Blood pressure 132 over 62 mmHg, temperature 98.5 heart rate 74/min and respiration 18/min.  His laboratory panel including CBC and BMP essentially stable.  Chest x-ray read as - Developing bilateral pleural effusions and adjacent lung opacities, left-greater-than-right.  I have discussed with EDP Dr. Derrill Kay and cardiology Dr. Welton Flakes.  Assessment and plan -Pleural effusion Patient can be given 40 mg of IV Lasix while in the ED.  Dr. Welton Flakes has graciously agreed to see him in the office on 11/09/2022 tomorrow at 9 AM. -Considering patient is hemodynamically stable and not acutely hypoxic - I do not see need for inpatient admission. If clinical condition worsens/changes, please reconsult   Time spent: Primarily reviewing chart and coordination of care 15 minutes

## 2022-11-08 NOTE — Discharge Instructions (Addendum)
Please seek medical attention for any high fevers, chest pain, shortness of breath, change in behavior, persistent vomiting, bloody stool or any other new or concerning symptoms.  

## 2022-11-09 ENCOUNTER — Ambulatory Visit (INDEPENDENT_AMBULATORY_CARE_PROVIDER_SITE_OTHER): Payer: Self-pay | Admitting: Cardiovascular Disease

## 2022-11-09 ENCOUNTER — Encounter: Payer: Self-pay | Admitting: Cardiovascular Disease

## 2022-11-09 VITALS — BP 128/72 | HR 86 | Ht 63.0 in | Wt 135.0 lb

## 2022-11-09 DIAGNOSIS — R0602 Shortness of breath: Secondary | ICD-10-CM

## 2022-11-09 DIAGNOSIS — I10 Essential (primary) hypertension: Secondary | ICD-10-CM

## 2022-11-09 DIAGNOSIS — I5042 Chronic combined systolic (congestive) and diastolic (congestive) heart failure: Secondary | ICD-10-CM | POA: Diagnosis not present

## 2022-11-09 MED ORDER — FUROSEMIDE 20 MG PO TABS
20.0000 mg | ORAL_TABLET | Freq: Every day | ORAL | 1 refills | Status: DC
Start: 2022-11-09 — End: 2023-02-26

## 2022-11-09 NOTE — Progress Notes (Signed)
Cardiology Office Note   Date:  11/09/2022   ID:  Thomas Moore, DOB [birth year], MRN [id number]  PCP:  Lauro Regulus, MD  Cardiologist:  Adrian Blackwater, MD      History of Present Illness: Thomas Moore is a 67 y.o. male who presents for  Chief Complaint  Patient presents with   Acute Visit    Shortness of breath    Patient in office for ED follow up. Went to ED yesterday for shortness of breath.     Past Medical History:  Diagnosis Date   CAD (coronary artery disease) of artery bypass graft    s/p CABG x 4 in 1997   CHF (congestive heart failure) (HCC)    COPD (chronic obstructive pulmonary disease) (HCC)    Coronary artery disease    CVA (cerebral vascular accident) (HCC)    Diabetes mellitus without complication (HCC)    Dysrhythmia    GERD (gastroesophageal reflux disease)    HLD (hyperlipidemia)    Hypertension    S/P TAVR (transcatheter aortic valve replacement) 07/05/2021   with Edwards 26mm S3UR via TF approach with Dr. Excell Seltzer and Dr. Laneta Simmers   Seizures [facility])      Past Surgical History:  Procedure Laterality Date   BYPASS GRAFT ANGIOGRAPHY N/A 04/25/2021   Procedure: BYPASS GRAFT ANGIOGRAPHY;  Surgeon: Armando Reichert, MD;  Location: [facility] INVASIVE CV LAB;  Service: Cardiovascular;  Laterality: N/A;   CARDIAC SURGERY     CORNEAL TRANSPLANT Right    CORONARY ARTERY BYPASS GRAFT  1997   ELECTROPHYSIOLOGY STUDY N/A 09/08/2021   Procedure: ELECTROPHYSIOLOGY STUDY;  Surgeon: Lanier Prude, MD;  Location: [facility] INVASIVE CV LAB;  Service: Cardiovascular;  Laterality: N/A;   EYE SURGERY     FLEXOR TENDON REPAIR Left 07/11/2019   Procedure: FLEXOR tenolysis  REPAIR LEFT RING FINGER with tednon repair;  Surgeon: Kennedy Bucker, MD;  Location: [facility] ORS;  Service: Orthopedics;  Laterality: Left;   INCISION AND DRAINAGE ABSCESS Left 05/08/2019   Procedure: INCISION AND DRAINAGE ABSCESS;  Surgeon: Donato Heinz, MD;  Location: [facility] ORS;  Service:  Orthopedics;  Laterality: Left;   INTRAOPERATIVE TRANSTHORACIC ECHOCARDIOGRAM N/A 07/05/2021   Procedure: INTRAOPERATIVE TRANSTHORACIC ECHOCARDIOGRAM;  Surgeon: Tonny Bollman, MD;  Location: [facility] OR;  Service: Open Heart Surgery;  Laterality: N/A;   LEFT HEART CATH AND CORONARY ANGIOGRAPHY Left 06/09/2022   Procedure: LEFT HEART CATH AND CORONARY ANGIOGRAPHY;  Surgeon: Laurier Nancy, MD;  Location: [facility] INVASIVE CV LAB;  Service: Cardiovascular;  Laterality: Left;   LEFT HEART CATH AND CORS/GRAFTS ANGIOGRAPHY N/A 10/02/2019   Procedure: LEFT HEART CATH AND CORS/GRAFTS ANGIOGRAPHY;  Surgeon: Marykay Lex, MD;  Location: [facility] INVASIVE CV LAB;  Service: Cardiovascular;  Laterality: N/A;   LEFT HEART CATH AND CORS/GRAFTS ANGIOGRAPHY N/A 07/17/2022   Procedure: LEFT HEART CATH AND CORS/GRAFTS ANGIOGRAPHY and possible PCI and stent;  Surgeon: Laurier Nancy, MD;  Location: [facility] INVASIVE CV LAB;  Service: Cardiovascular;  Laterality: N/A;   LOOP RECORDER INSERTION N/A 10/06/2019   Procedure: LOOP RECORDER INSERTION;  Surgeon: Marinus Maw, MD;  Location: [facility] INVASIVE CV LAB;  Service: Cardiovascular;  Laterality: N/A;   TRANSCATHETER AORTIC VALVE REPLACEMENT, TRANSFEMORAL N/A 07/05/2021   Procedure: TRANSCATHETER AORTIC VALVE REPLACEMENT, TRANSFEMORAL;  Surgeon: Tonny Bollman, MD;  Location: [facility] OR;  Service: Open Heart Surgery;  Laterality: N/A;   TRIGGER FINGER RELEASE Left      Current Outpatient Medications  Medication Sig Dispense Refill  acetaminophen (TYLENOL) 650 MG CR tablet Take 1,300 mg by mouth every 8 (eight) hours as needed for pain.     amiodarone (PACERONE) 200 MG tablet Take 1 tablet (200 mg total) by mouth daily. 30 tablet 3   apixaban (ELIQUIS) 5 MG TABS tablet Take 1 tablet (5 mg total) by mouth 2 (two) times daily. 60 tablet 3   atorvastatin (LIPITOR) 80 MG tablet Take 1 tablet (80 mg total) by mouth at bedtime. 30 tablet 1   azithromycin (ZITHROMAX Z-PAK) 250 MG tablet Take  2 tablets (500 mg) on  Day 1,  followed by 1 tablet (250 mg) once daily on Days 2 through 5. (Patient taking differently: Take 2 tablets (500 mg) on  Day 1,  followed by 1 tablet (250 mg) once daily on Days 2 through 5.) 6 each 0   Calcium Citrate-Vitamin D (CALCIUM + D PO) Take 1 tablet by mouth 2 (two) times daily.     clopidogrel (PLAVIX) 75 MG tablet Take 1 tablet (75 mg total) by mouth daily. 30 tablet 11   dapagliflozin propanediol (FARXIGA) 10 MG TABS tablet Take 1 tablet (10 mg total) by mouth daily before breakfast. 30 tablet 5   divalproex (DEPAKOTE ER) 250 MG 24 hr tablet Take 1 tablet (250 mg total) by mouth at bedtime. 30 tablet 0   Dulaglutide 3 MG/0.5ML SOPN Inject 3 mg into the skin once a week.     furosemide (LASIX) 20 MG tablet Take 1 tablet (20 mg total) by mouth daily. 30 tablet 1   isosorbide mononitrate (IMDUR) 30 MG 24 hr tablet Take 30 mg by mouth daily.     metoprolol succinate (TOPROL-XL) 25 MG 24 hr tablet Take 0.5 tablets (12.5 mg total) by mouth daily. 30 tablet 0   Multiple Vitamin (MULTIVITAMIN) capsule Take 1 capsule by mouth daily.     nitroGLYCERIN (NITROSTAT) 0.4 MG SL tablet Place 0.4 mg under the tongue every 5 (five) minutes as needed for chest pain.     pantoprazole (PROTONIX) 40 MG tablet Take 40 mg by mouth daily.     No current facility-administered medications for this visit.    Allergies:   Patient has no known allergies.    Social History:   reports that he quit smoking about 40 years ago. His smoking use included cigarettes. He has never used smokeless tobacco. He reports that he does not currently use alcohol. He reports that he does not use drugs.   Family History:  family history includes Seizures in his brother.    ROS:     Review of Systems  Constitutional: Negative.   HENT: Negative.    Eyes: Negative.   Respiratory:  Positive for shortness of breath.   Cardiovascular: Negative.   Gastrointestinal: Negative.   Genitourinary:  Negative.   Musculoskeletal: Negative.   Skin: Negative.   Neurological: Negative.   Endo/Heme/Allergies: Negative.   Psychiatric/Behavioral: Negative.    All other systems reviewed and are negative.    All other systems are reviewed and negative.    PHYSICAL EXAM: VS:  BP 128/72   Pulse 86   Ht 5\' 3"  (1.6 m)   Wt 135 lb (61.2 kg)   SpO2 (!) 88%   BMI 23.91 kg/m  , BMI Body mass index is 23.91 kg/m. Last weight:  Wt Readings from Last 3 Encounters:  11/09/22 135 lb (61.2 kg)  11/08/22 136 lb 11 oz (62 kg)  10/27/22 138 lb (62.6 kg)     Physical  Exam Vitals reviewed.  Constitutional:      Appearance: Normal appearance. He is normal weight.  HENT:     Head: Normocephalic.     Nose: Nose normal.     Mouth/Throat:     Mouth: Mucous membranes are moist.  Eyes:     Pupils: Pupils are equal, round, and reactive to light.  Cardiovascular:     Rate and Rhythm: Normal rate and regular rhythm.     Pulses: Normal pulses.     Heart sounds: Normal heart sounds.  Pulmonary:     Effort: Pulmonary effort is normal.  Abdominal:     General: Abdomen is flat. Bowel sounds are normal.  Musculoskeletal:        General: Normal range of motion.     Cervical back: Normal range of motion.  Skin:    General: Skin is warm.  Neurological:     General: No focal deficit present.     Mental Status: He is alert.  Psychiatric:        Mood and Affect: Mood normal.     EKG: none today  Recent Labs: 07/16/2022: Magnesium 2.0 10/02/2022: ALT 17; B Natriuretic Peptide 531.2 11/08/2022: BUN 23; Creatinine, Ser 1.36; Hemoglobin 9.2; Platelets 199; Potassium 4.1; Sodium 137    Lipid Panel    Component Value Date/Time   CHOL 141 05/27/2022 0520   CHOL 143 02/25/2020 1126   TRIG 159 (H) 05/27/2022 0520   HDL 32 (L) 05/27/2022 0520   HDL 31 (L) 02/25/2020 1126   CHOLHDL 4.4 05/27/2022 0520   VLDL 32 05/27/2022 0520   LDLCALC 77 05/27/2022 0520   LDLCALC 78 02/25/2020 1126       ASSESSMENT AND PLAN:    ICD-10-CM   1. SOB (shortness of breath)  R06.02     2. Chronic combined systolic and diastolic congestive heart failure (HCC)  I50.42     3. Essential hypertension  I10        Problem List Items Addressed This Visit       Cardiovascular and Mediastinum   Essential hypertension   Relevant Medications   furosemide (LASIX) 20 MG tablet   Chronic combined systolic and diastolic congestive heart failure (HCC)   Relevant Medications   furosemide (LASIX) 20 MG tablet     Other   SOB (shortness of breath) - Primary    Patient went to ED yesterday for worsening shortness of breath. Chest xray revealed bilateral pleural effusion. Given antibiotics and IV Lasix. Patient reports feeling better today. Start Lasix 20 mg daily. Return in one week with labs same day.         Disposition:   Return in about 1 week (around 11/16/2022).    Total time spent: 30 minutes  Signed,  Adrian Blackwater, MD  11/09/2022 10:42 AM    [facility]

## 2022-11-09 NOTE — Patient Instructions (Signed)
Start furosemide daily

## 2022-11-09 NOTE — Assessment & Plan Note (Signed)
Patient went to ED yesterday for worsening shortness of breath. Chest xray revealed bilateral pleural effusion. Given antibiotics and IV Lasix. Patient reports feeling better today. Start Lasix 20 mg daily. Return in one week with labs same day.

## 2022-11-10 DIAGNOSIS — Z7902 Long term (current) use of antithrombotics/antiplatelets: Secondary | ICD-10-CM | POA: Diagnosis not present

## 2022-11-10 DIAGNOSIS — Z823 Family history of stroke: Secondary | ICD-10-CM | POA: Diagnosis not present

## 2022-11-10 DIAGNOSIS — Z974 Presence of external hearing-aid: Secondary | ICD-10-CM | POA: Diagnosis not present

## 2022-11-10 DIAGNOSIS — I255 Ischemic cardiomyopathy: Secondary | ICD-10-CM | POA: Diagnosis not present

## 2022-11-10 DIAGNOSIS — Z7901 Long term (current) use of anticoagulants: Secondary | ICD-10-CM | POA: Diagnosis not present

## 2022-11-10 DIAGNOSIS — Z86711 Personal history of pulmonary embolism: Secondary | ICD-10-CM | POA: Diagnosis not present

## 2022-11-10 DIAGNOSIS — I2510 Atherosclerotic heart disease of native coronary artery without angina pectoris: Secondary | ICD-10-CM | POA: Diagnosis not present

## 2022-11-10 DIAGNOSIS — N189 Chronic kidney disease, unspecified: Secondary | ICD-10-CM | POA: Diagnosis not present

## 2022-11-10 DIAGNOSIS — Z87891 Personal history of nicotine dependence: Secondary | ICD-10-CM | POA: Diagnosis not present

## 2022-11-10 DIAGNOSIS — I252 Old myocardial infarction: Secondary | ICD-10-CM | POA: Diagnosis not present

## 2022-11-10 DIAGNOSIS — I4892 Unspecified atrial flutter: Secondary | ICD-10-CM | POA: Diagnosis not present

## 2022-11-10 DIAGNOSIS — I69398 Other sequelae of cerebral infarction: Secondary | ICD-10-CM | POA: Diagnosis not present

## 2022-11-10 DIAGNOSIS — E785 Hyperlipidemia, unspecified: Secondary | ICD-10-CM | POA: Diagnosis not present

## 2022-11-10 DIAGNOSIS — I4891 Unspecified atrial fibrillation: Secondary | ICD-10-CM | POA: Diagnosis not present

## 2022-11-10 DIAGNOSIS — I129 Hypertensive chronic kidney disease with stage 1 through stage 4 chronic kidney disease, or unspecified chronic kidney disease: Secondary | ICD-10-CM | POA: Diagnosis not present

## 2022-11-10 DIAGNOSIS — Z7984 Long term (current) use of oral hypoglycemic drugs: Secondary | ICD-10-CM | POA: Diagnosis not present

## 2022-11-10 DIAGNOSIS — D6869 Other thrombophilia: Secondary | ICD-10-CM | POA: Diagnosis not present

## 2022-11-10 DIAGNOSIS — K219 Gastro-esophageal reflux disease without esophagitis: Secondary | ICD-10-CM | POA: Diagnosis not present

## 2022-11-13 NOTE — Progress Notes (Unsigned)
Cardiology Office Note Date:  11/14/2022  Patient ID:  Thomas, Moore 07/16/1955, MRN [id number] PCP:  Lauro Regulus, MD  Cardiologist:  Adrian Blackwater, MD Electrophysiologist: Lanier Prude, MD  Chief Complaint: AFib, NSVT follow-up  History of Present Illness: Thomas Moore is a 67 y.o. male with PMH notable for CAD, parox afib, HTN, T2DM, HFmrEF, NSVT, CVA, syncope; seen today for Lanier Prude, MD for routine electrophysiology followup.  He is s/p EP study where VT was not able to be sustained. He saw Dr. Welton Flakes last week for SOB follow-up after ER visit. He was started on 20mg  Lasix.   Today, he states that he is breathing much better than last week.  No longer SOB with activities.  He has follow-up scheduled in 2 days with Dr. Welton Flakes. His main complaint today is unexplained weight loss states that he has lost about 40 pounds in the last year.  Somewhat reduced appetite, but he states not enough to have lost that much weight.  He has discussed this with PCP and Dr. Lennette Bihari in which she states they did not seem particularly concerned. He has had no recent episodes of syncope.  He did have an episode where he became very dizzy and lightheaded, needed to "get on all fours on the ground ".  Does not remember when this happened but thinks it was about 2 months ago.  He did not check his blood sugar, blood pressure during this episode.  Diligently takes Eliquis twice daily, no bleeding concerns, no missed doses. He is a poor historian with tangential stories   Device Information: MDT ILR, imp 10/2019; dx syncope  AAD:  Amiodarone   Past Medical History:  Diagnosis Date   CAD (coronary artery disease) of artery bypass graft    s/p CABG x 4 in 1997   CHF (congestive heart failure) (HCC)    COPD (chronic obstructive pulmonary disease) (HCC)    Coronary artery disease    CVA (cerebral vascular accident) (HCC)    Diabetes mellitus without complication (HCC)    Dysrhythmia     GERD (gastroesophageal reflux disease)    HLD (hyperlipidemia)    Hypertension    S/P TAVR (transcatheter aortic valve replacement) 07/05/2021   with Edwards 26mm S3UR via TF approach with Dr. Excell Seltzer and Dr. Laneta Simmers   Seizures [facility])     Past Surgical History:  Procedure Laterality Date   BYPASS GRAFT ANGIOGRAPHY N/A 04/25/2021   Procedure: BYPASS GRAFT ANGIOGRAPHY;  Surgeon: Armando Reichert, MD;  Location: [facility] INVASIVE CV LAB;  Service: Cardiovascular;  Laterality: N/A;   CARDIAC SURGERY     CORNEAL TRANSPLANT Right    CORONARY ARTERY BYPASS GRAFT  1997   ELECTROPHYSIOLOGY STUDY N/A 09/08/2021   Procedure: ELECTROPHYSIOLOGY STUDY;  Surgeon: Lanier Prude, MD;  Location: [facility] INVASIVE CV LAB;  Service: Cardiovascular;  Laterality: N/A;   EYE SURGERY     FLEXOR TENDON REPAIR Left 07/11/2019   Procedure: FLEXOR tenolysis  REPAIR LEFT RING FINGER with tednon repair;  Surgeon: Kennedy Bucker, MD;  Location: [facility] ORS;  Service: Orthopedics;  Laterality: Left;   INCISION AND DRAINAGE ABSCESS Left 05/08/2019   Procedure: INCISION AND DRAINAGE ABSCESS;  Surgeon: Donato Heinz, MD;  Location: [facility] ORS;  Service: Orthopedics;  Laterality: Left;   INTRAOPERATIVE TRANSTHORACIC ECHOCARDIOGRAM N/A 07/05/2021   Procedure: INTRAOPERATIVE TRANSTHORACIC ECHOCARDIOGRAM;  Surgeon: Tonny Bollman, MD;  [facility] OR;  Service: Open Heart Surgery;  Laterality: N/A;   LEFT  HEART CATH AND CORONARY ANGIOGRAPHY Left 06/09/2022   Procedure: LEFT HEART CATH AND CORONARY ANGIOGRAPHY;  Surgeon: Laurier Nancy, MD;  Location: [facility] INVASIVE CV LAB;  Service: Cardiovascular;  Laterality: Left;   LEFT HEART CATH AND CORS/GRAFTS ANGIOGRAPHY N/A 10/02/2019   Procedure: LEFT HEART CATH AND CORS/GRAFTS ANGIOGRAPHY;  Surgeon: Marykay Lex, MD;  Location: [facility] INVASIVE CV LAB;  Service: Cardiovascular;  Laterality: N/A;   LEFT HEART CATH AND CORS/GRAFTS ANGIOGRAPHY N/A 07/17/2022   Procedure: LEFT HEART CATH AND  CORS/GRAFTS ANGIOGRAPHY and possible PCI and stent;  Surgeon: Laurier Nancy, MD;  Location: [facility] INVASIVE CV LAB;  Service: Cardiovascular;  Laterality: N/A;   LOOP RECORDER INSERTION N/A 10/06/2019   Procedure: LOOP RECORDER INSERTION;  Surgeon: Marinus Maw, MD;  Location: [facility] INVASIVE CV LAB;  Service: Cardiovascular;  Laterality: N/A;   TRANSCATHETER AORTIC VALVE REPLACEMENT, TRANSFEMORAL N/A 07/05/2021   Procedure: TRANSCATHETER AORTIC VALVE REPLACEMENT, TRANSFEMORAL;  Surgeon: Tonny Bollman, MD;  Location: [facility] OR;  Service: Open Heart Surgery;  Laterality: N/A;   TRIGGER FINGER RELEASE Left     Current Outpatient Medications  Medication Instructions   acetaminophen (TYLENOL) 1,300 mg, Oral, Every 8 hours PRN   amiodarone (PACERONE) 200 mg, Oral, Daily   apixaban (ELIQUIS) 5 mg, Oral, 2 times daily   atorvastatin (LIPITOR) 80 mg, Oral, Daily at bedtime   Calcium Citrate-Vitamin D (CALCIUM + D PO) 1 tablet, Oral, 2 times daily   clopidogrel (PLAVIX) 75 mg, Oral, Daily   dapagliflozin propanediol (FARXIGA) 10 mg, Oral, Daily before breakfast   divalproex (DEPAKOTE ER) 250 mg, Oral, Daily at bedtime   doxycycline (VIBRAMYCIN) 100 MG capsule 1 capsule, Oral, 2 times daily   Dulaglutide 3 mg, Subcutaneous, Weekly   furosemide (LASIX) 20 mg, Oral, Daily   isosorbide mononitrate (IMDUR) 30 mg, Oral, Daily   metoprolol succinate (TOPROL-XL) 12.5 mg, Oral, Daily   Multiple Vitamin (MULTIVITAMIN) capsule 1 capsule, Oral, Daily   nitroGLYCERIN (NITROSTAT) 0.4 mg, Sublingual, Every 5 min PRN   pantoprazole (PROTONIX) 40 mg, Oral, Daily    Social History:  The patient  reports that he quit smoking about 40 years ago. His smoking use included cigarettes. He has never used smokeless tobacco. He reports that he does not currently use alcohol. He reports that he does not use drugs.   Family History:  The patient's family history includes Seizures in his brother.  ROS:  Please see the history  of present illness. All other systems are reviewed and otherwise negative.   PHYSICAL EXAM:  VS:  BP 134/60 (BP Location: Left Arm, Patient Position: Sitting, Cuff Size: Normal)   Pulse 70   Ht 5\' 3"  (1.6 m)   Wt 134 lb (60.8 kg)   SpO2 98%   BMI 23.74 kg/m  BMI: Body mass index is 23.74 kg/m.  GEN- The patient is well appearing, alert and oriented x 3 today.   Lungs-expiratory wheezing on the right, clears with cough.  Otherwise clear to ausculation bilaterally, normal work of breathing.  Heart- Regular rate and rhythm, no murmurs, rubs or gallops Extremities- No peripheral edema, warm, dry Skin-   ILR insertion site device pocket well-healed, no tethering   Carelink reviewed by myself: :  Battery good No recent AF episodes  Elevated AF burden April-May 2024  EKG is not ordered. Personal review of EKG from  11/09/2022  shows:  NSR, rate 76  Recent Labs: 07/16/2022: Magnesium 2.0 10/02/2022: ALT 17; B Natriuretic Peptide 531.2 11/08/2022:  BUN 23; Creatinine, Ser 1.36; Hemoglobin 9.2; Platelets 199; Potassium 4.1; Sodium 137  05/27/2022: Cholesterol 141; HDL 32; LDL Cholesterol 77; Total CHOL/HDL Ratio 4.4; Triglycerides 159; VLDL 32   Estimated Creatinine Clearance: 42.4 mL/min (A) (by C-G formula based on SCr of 1.36 mg/dL (H)).   Wt Readings from Last 3 Encounters:  11/14/22 134 lb (60.8 kg)  11/09/22 135 lb (61.2 kg)  11/08/22 136 lb 11 oz (62 kg)     Additional studies reviewed include: Previous EP, cardiology notes.   TTE, 10/20/22 Technically difficult study due to body habitus.  Mild left ventricular systolic dysfunction.  Normal right ventricular systolic function.  Normal right ventricular diastolic function.  Mild diffuse hypokinesis  Trace tricuspid regurgitation.  Normal pulmonary artery pressure.  Mild mitral regurgitation.  Mild aortic regurgitation.  Mild aortic stenosis.  No pericardial effusion.  Mildly dilated Left and Right atrium  Mild LVH      ASSESSMENT AND PLAN:  #) parox AFib No recent burden.  It is possible that patient's episode from 2 months ago correlates to increased A-fib burden I recommended if patient has similar episode in future to check his blood sugar and blood pressure, and to contact the device clinic for arrhythmia evaluation Continue Toprol 12.5 daily  #) Hypercoag d/t AFib CHA2DS2-VASc Score = 5 [CHF History: 1, HTN History: 1, Diabetes History: 1, Stroke History: 0, Vascular Disease History: 1, Age Score: 1, Gender Score: 0].  Therefore, the patient's annual risk of stroke is 7.2 %. NOAC - 5mg  Eliquis BID, appropriately dosed No bleeding concerns  #) syncope and collapse MDT ILR in place No further syncopal episodes   #) NSVT No recent ventricular arrhythmia by ILR  #) SOB #) pulm edema Short of breath is improved with increased activity tolerance Has follow-up appointment scheduled later this week with Dr. Welton Flakes Continue 20mg  lasix     Current medicines are reviewed at length with the patient today.   The patient does not have concerns regarding his medicines.  The following changes were made today:  none  Labs/ tests ordered today include:  No orders of the defined types were placed in this encounter.    Disposition: Follow up with Dr. Lalla Brothers or EP APP in 12 months   Signed, Sherie Don, NP  11/14/22  10:24 AM  Electrophysiology [facility]

## 2022-11-14 ENCOUNTER — Emergency Department
Admission: EM | Admit: 2022-11-14 | Discharge: 2022-11-14 | Disposition: A | Discharge status: Home / Self Care | Payer: Medicare HMO | Attending: Emergency Medicine | Admitting: Emergency Medicine

## 2022-11-14 ENCOUNTER — Telehealth: Payer: Self-pay

## 2022-11-14 ENCOUNTER — Ambulatory Visit: Payer: Medicare HMO | Attending: Cardiology | Admitting: Cardiology

## 2022-11-14 ENCOUNTER — Other Ambulatory Visit: Payer: Self-pay

## 2022-11-14 ENCOUNTER — Emergency Department: Payer: Medicare HMO

## 2022-11-14 ENCOUNTER — Encounter: Payer: Self-pay | Admitting: Cardiology

## 2022-11-14 VITALS — BP 134/60 | HR 70 | Ht 63.0 in | Wt 134.0 lb

## 2022-11-14 DIAGNOSIS — I509 Heart failure, unspecified: Secondary | ICD-10-CM | POA: Insufficient documentation

## 2022-11-14 DIAGNOSIS — I10 Essential (primary) hypertension: Secondary | ICD-10-CM | POA: Diagnosis not present

## 2022-11-14 DIAGNOSIS — R55 Syncope and collapse: Secondary | ICD-10-CM | POA: Diagnosis not present

## 2022-11-14 DIAGNOSIS — J449 Chronic obstructive pulmonary disease, unspecified: Secondary | ICD-10-CM | POA: Diagnosis not present

## 2022-11-14 DIAGNOSIS — I480 Paroxysmal atrial fibrillation: Secondary | ICD-10-CM

## 2022-11-14 DIAGNOSIS — Z8673 Personal history of transient ischemic attack (TIA), and cerebral infarction without residual deficits: Secondary | ICD-10-CM | POA: Insufficient documentation

## 2022-11-14 DIAGNOSIS — E119 Type 2 diabetes mellitus without complications: Secondary | ICD-10-CM | POA: Insufficient documentation

## 2022-11-14 DIAGNOSIS — I2510 Atherosclerotic heart disease of native coronary artery without angina pectoris: Secondary | ICD-10-CM | POA: Diagnosis not present

## 2022-11-14 DIAGNOSIS — Z7901 Long term (current) use of anticoagulants: Secondary | ICD-10-CM | POA: Diagnosis not present

## 2022-11-14 DIAGNOSIS — I110 Hypertensive heart disease with heart failure: Secondary | ICD-10-CM | POA: Insufficient documentation

## 2022-11-14 DIAGNOSIS — R42 Dizziness and giddiness: Secondary | ICD-10-CM | POA: Diagnosis not present

## 2022-11-14 DIAGNOSIS — R569 Unspecified convulsions: Secondary | ICD-10-CM | POA: Insufficient documentation

## 2022-11-14 DIAGNOSIS — R4182 Altered mental status, unspecified: Secondary | ICD-10-CM | POA: Diagnosis not present

## 2022-11-14 DIAGNOSIS — I4729 Other ventricular tachycardia: Secondary | ICD-10-CM

## 2022-11-14 LAB — TROPONIN I (HIGH SENSITIVITY): Troponin I (High Sensitivity): 24 ng/L — ABNORMAL HIGH (ref <18)

## 2022-11-14 LAB — CUP PACEART INCLINIC DEVICE CHECK
Date Time Interrogation Session: 20240611102935
Implantable Pulse Generator Implant Date: 20210503

## 2022-11-14 LAB — URINALYSIS, ROUTINE W REFLEX MICROSCOPIC
Bacteria, UA: NONE SEEN
Bilirubin Urine: NEGATIVE
Glucose, UA: >=500 mg/dL — AB
Ketones, ur: NEGATIVE mg/dL
Leukocytes,Ua: NEGATIVE
Nitrite: NEGATIVE
Protein, ur: 30 mg/dL — AB
Specific Gravity, Urine: 1.016 (ref 1.005–1.030)
Squamous Epithelial / HPF: NONE SEEN /HPF (ref 0–5)
pH: 5 (ref 5.0–8.0)

## 2022-11-14 LAB — COMPREHENSIVE METABOLIC PANEL
ALT: 12 U/L (ref 0–44)
AST: 22 U/L (ref 15–41)
Albumin: 3.4 g/dL — ABNORMAL LOW (ref 3.5–5.0)
Alkaline Phosphatase: 76 U/L (ref 38–126)
Anion gap: 13 (ref 5–15)
BUN: 39 mg/dL — ABNORMAL HIGH (ref 8–23)
CO2: 23 mmol/L (ref 22–32)
Calcium: 8.8 mg/dL — ABNORMAL LOW (ref 8.9–10.3)
Chloride: 95 mmol/L — ABNORMAL LOW (ref 98–111)
Creatinine, Ser: 1.96 mg/dL — ABNORMAL HIGH (ref 0.61–1.24)
GFR, Estimated: 37 mL/min — ABNORMAL LOW (ref >60)
Glucose, Bld: 155 mg/dL — ABNORMAL HIGH (ref 70–99)
Potassium: 3.7 mmol/L (ref 3.5–5.1)
Sodium: 131 mmol/L — ABNORMAL LOW (ref 135–145)
Total Bilirubin: 0.5 mg/dL (ref 0.3–1.2)
Total Protein: 7.8 g/dL (ref 6.5–8.1)

## 2022-11-14 LAB — CBC WITH DIFFERENTIAL/PLATELET
Abs Immature Granulocytes: 0.04 10*3/uL (ref 0.00–0.07)
Basophils Absolute: 0 10*3/uL (ref 0.0–0.1)
Basophils Relative: 0 %
Eosinophils Absolute: 0.2 10*3/uL (ref 0.0–0.5)
Eosinophils Relative: 3 %
HCT: 29 % — ABNORMAL LOW (ref 39.0–52.0)
Hemoglobin: 8.5 g/dL — ABNORMAL LOW (ref 13.0–17.0)
Immature Granulocytes: 1 %
Lymphocytes Relative: 9 %
Lymphs Abs: 0.6 10*3/uL — ABNORMAL LOW (ref 0.7–4.0)
MCH: 23.2 pg — ABNORMAL LOW (ref 26.0–34.0)
MCHC: 29.3 g/dL — ABNORMAL LOW (ref 30.0–36.0)
MCV: 79.2 fL — ABNORMAL LOW (ref 80.0–100.0)
Monocytes Absolute: 0.6 10*3/uL (ref 0.1–1.0)
Monocytes Relative: 8 %
Neutro Abs: 5.6 10*3/uL (ref 1.7–7.7)
Neutrophils Relative %: 79 %
Platelets: 256 10*3/uL (ref 150–400)
RBC: 3.66 MIL/uL — ABNORMAL LOW (ref 4.22–5.81)
RDW: 17.2 % — ABNORMAL HIGH (ref 11.5–15.5)
WBC: 7 10*3/uL (ref 4.0–10.5)
nRBC: 0 % (ref 0.0–0.2)

## 2022-11-14 MED ORDER — APIXABAN 5 MG PO TABS: 5.0000 mg | ORAL_TABLET | Freq: Two times a day (BID) | ORAL | 1 refills | Status: DC

## 2022-11-14 MED ORDER — SODIUM CHLORIDE 0.9 % IV SOLN: Freq: Once | INTRAVENOUS | Status: DC

## 2022-11-14 MED ORDER — SODIUM CHLORIDE 0.9 % IV BOLUS
250.0000 mL | Freq: Once | INTRAVENOUS | Status: AC
  Administered 2022-11-14: 250 mL via INTRAVENOUS

## 2022-11-14 NOTE — Telephone Encounter (Signed)
Eliquis 5mg  refill request received. Patient is 67 years old, weight-60.8kg, Crea-1.36 on 11/08/22, Diagnosis-Afib, and last seen by Sherie Don on 11/14/22. Dose is appropriate based on dosing criteria. Will send in refill to requested pharmacy.

## 2022-11-14 NOTE — ED Triage Notes (Addendum)
Arrives from home via [facility].  Patient was in gazebo sitting with friends.  Friends report that patient suddenly started to shake and family called 911.  Once EMS arrived patient AAOx3, but conversation is circular, repetitive. CBG:  189.  VS wnl.

## 2022-11-14 NOTE — Telephone Encounter (Signed)
*  STAT* If patient is at the pharmacy, call can be transferred to refill team.   1. Which medications need to be refilled? (please list name of each medication and dose if known) Eliquis  2. Which pharmacy/location (including street and city if local pharmacy) is medication to be sent to?  [facility] on [address], [place]  3. Do they need a 30 day or 90 day supply? 90

## 2022-11-14 NOTE — ED Notes (Signed)
Rainbow sent to the lab at this time.  

## 2022-11-14 NOTE — Patient Instructions (Addendum)
Medication Instructions:  Your physician recommends that you continue on your current medications as directed. Please refer to the Current Medication list given to you today.  *If you need a refill on your cardiac medications before your next appointment, please call your pharmacy*  Lab Work: No labs ordered  If you have labs (blood work) drawn today and your tests are completely normal, you will receive your results only by: MyChart Message (if you have MyChart) OR A paper copy in the mail If you have any lab test that is abnormal or we need to change your treatment, we will call you to review the results.   Testing/Procedures: No testing ordered  Follow-Up: At [facility], you and your health needs are our priority.  As part of our continuing mission to provide you with exceptional heart care, we have created designated Provider Care Teams.  These Care Teams include your primary Cardiologist (physician) and Advanced Practice Providers (APPs -  Physician Assistants and Nurse Practitioners) who all work together to provide you with the care you need, when you need it.  We recommend signing up for the patient portal called "MyChart".  Sign up information is provided on this After Visit Summary.  MyChart is used to connect with patients for Virtual Visits (Telemedicine).  Patients are able to view lab/test results, encounter notes, upcoming appointments, etc.  Non-urgent messages can be sent to your provider as well.   To learn more about what you can do with MyChart, go to [email or web address].    Your next appointment:   1 year(s)  Provider:   Steffanie Dunn, MD or Sherie Don, NP  Other Instructions When you are at zero refills on your medication, please call the office and we will send more to your pharmacy.  If you get dizzy or lightheaded: Check your blood sugar Check your blood pressure **If both of these are normal, call the device clinic so we can review your  implantable loop device. Their number is: [phone]

## 2022-11-14 NOTE — ED Provider Notes (Signed)
[facility] Provider Note    Event Date/Time   First MD Initiated Contact with Patient 11/14/22 2137     (approximate)   History   Seizures   HPI  Thomas Moore is a 67 y.o. male with history of CAD CHF COPD CVA diabetes seizures, hypertension who presents after possible seizure.  Patient was apparently sitting with friends, started to shake and became unresponsive.  Initially was confused afterwards but now appears to be at his baseline and states that he feels well.  No neurodeficits reported     Physical Exam   Triage Vital Signs: ED Triage Vitals  Enc Vitals Group     BP 11/14/22 1914 125/64     Pulse Rate 11/14/22 1914 76     Resp 11/14/22 1914 18     Temp 11/14/22 1914 98.3 F (36.8 C)     Temp Source 11/14/22 1914 Oral     SpO2 11/14/22 1914 99 %     Weight 11/14/22 1807 60.7 kg (133 lb 13.1 oz)     Height 11/14/22 1811 1.6 m (5\' 3" )     Head Circumference --      Peak Flow --      Pain Score 11/14/22 1811 0     Pain Loc --      Pain Edu? --      Excl. in GC? --     Most recent vital signs: Vitals:   11/14/22 1914  BP: 125/64  Pulse: 76  Resp: 18  Temp: 98.3 F (36.8 C)  SpO2: 99%     General: Awake, no distress.  CV:  Good peripheral perfusion.  Resp:  Normal effort.  Abd:  No distention.  Other:  Cranial nerves II through XII are normal, moving all extremities equally.  PERRLA, EOMI   ED Results / Procedures / Treatments   Labs (all labs ordered are listed, but only abnormal results are displayed) Labs Reviewed  URINALYSIS, ROUTINE W REFLEX MICROSCOPIC - Abnormal; Notable for the following components:      Result Value   Color, Urine YELLOW (*)    APPearance CLEAR (*)    Glucose, UA >=500 (*)    Hgb urine dipstick MODERATE (*)    Protein, ur 30 (*)    All other components within normal limits  CBC WITH DIFFERENTIAL/PLATELET - Abnormal; Notable for the following components:   RBC 3.66 (*)    Hemoglobin 8.5  (*)    HCT 29.0 (*)    MCV 79.2 (*)    MCH 23.2 (*)    MCHC 29.3 (*)    RDW 17.2 (*)    Lymphs Abs 0.6 (*)    All other components within normal limits  COMPREHENSIVE METABOLIC PANEL - Abnormal; Notable for the following components:   Sodium 131 (*)    Chloride 95 (*)    Glucose, Bld 155 (*)    BUN 39 (*)    Creatinine, Ser 1.96 (*)    Calcium 8.8 (*)    Albumin 3.4 (*)    GFR, Estimated 37 (*)    All other components within normal limits  TROPONIN I (HIGH SENSITIVITY) - Abnormal; Notable for the following components:   Troponin I (High Sensitivity) 24 (*)    All other components within normal limits     EKG  ED ECG REPORT I, Jene Every, the attending physician, personally viewed and interpreted this ECG.  Date: 11/14/2022  Rhythm: normal sinus rhythm QRS Axis: normal  Intervals: normal ST/T Wave abnormalities: normal Narrative Interpretation: no evidence of acute ischemia    RADIOLOGY CT head viewed interpreted by me, no acute abnormality, pending radiology review    PROCEDURES:  Critical Care performed:   Procedures   MEDICATIONS ORDERED IN ED: Medications  sodium chloride 0.9 % bolus 250 mL (has no administration in time range)     IMPRESSION / MDM / ASSESSMENT AND PLAN / ED COURSE  I reviewed the triage vital signs and the nursing notes. Patient's presentation is most consistent with acute presentation with potential threat to life or bodily function.  Patient presents after possible seizure, near syncope, unclear exactly what happened.  Patient reports he is feeling well and has no complaints at this time.  He denies chest pain or palpitations.  He does not have a great memory of the event which makes me think that he may have had a seizure versus a near syncopal episode.  May have been over diuresed, recently saw his cardiology for review of records   Lab work today is overall unremarkable, mild elevation of BUN/creatinine ratio suspect  overdiuresis, will give 250 cc bolus, patient feels well and would like to be discharged, no indication for admission at this time, strict return precautions, patient agrees with this plan.        FINAL CLINICAL IMPRESSION(S) / ED DIAGNOSES   Final diagnoses:  Seizure-like activity (HCC)     Rx / DC Orders   ED Discharge Orders     None        Note:  This document was prepared using Dragon voice recognition software and may include unintentional dictation errors.   Jene Every, MD 11/14/22 2206

## 2022-11-14 NOTE — ED Notes (Signed)
Pt to triage for vs; pt denies any c/o at present; st earlier he was "feeling bad and dizzy"; denies LOC, denies any recent illness, denies pain; protocols will be completed

## 2022-11-16 ENCOUNTER — Encounter: Payer: Self-pay | Admitting: Cardiovascular Disease

## 2022-11-16 ENCOUNTER — Ambulatory Visit (INDEPENDENT_AMBULATORY_CARE_PROVIDER_SITE_OTHER): Payer: Self-pay | Admitting: Cardiovascular Disease

## 2022-11-16 VITALS — BP 118/58 | HR 58 | Ht 63.0 in | Wt 133.0 lb

## 2022-11-16 DIAGNOSIS — I350 Nonrheumatic aortic (valve) stenosis: Secondary | ICD-10-CM | POA: Diagnosis not present

## 2022-11-16 DIAGNOSIS — I252 Old myocardial infarction: Secondary | ICD-10-CM | POA: Diagnosis not present

## 2022-11-16 DIAGNOSIS — I5042 Chronic combined systolic (congestive) and diastolic (congestive) heart failure: Secondary | ICD-10-CM

## 2022-11-16 DIAGNOSIS — I2510 Atherosclerotic heart disease of native coronary artery without angina pectoris: Secondary | ICD-10-CM

## 2022-11-16 DIAGNOSIS — I6523 Occlusion and stenosis of bilateral carotid arteries: Secondary | ICD-10-CM | POA: Diagnosis not present

## 2022-11-16 DIAGNOSIS — I10 Essential (primary) hypertension: Secondary | ICD-10-CM

## 2022-11-16 DIAGNOSIS — R55 Syncope and collapse: Secondary | ICD-10-CM

## 2022-11-16 NOTE — Progress Notes (Signed)
Cardiology Office Note   Date:  11/16/2022   ID:  Thomas Moore, DOB 1955-06-10, MRN [id number]  PCP:  Lauro Regulus, MD  Cardiologist:  Adrian Blackwater, MD      History of Present Illness: Thomas Moore is a 67 y.o. male who presents for  Chief Complaint  Patient presents with   Follow-up    1 week follow up    Was in ER 2 days ago as felt like passing out as was dehydrated. He became unresponsive and was shaking.      Past Medical History:  Diagnosis Date   CAD (coronary artery disease) of artery bypass graft    s/p CABG x 4 in 1997   CHF (congestive heart failure) (HCC)    COPD (chronic obstructive pulmonary disease) (HCC)    Coronary artery disease    CVA (cerebral vascular accident) (HCC)    Diabetes mellitus without complication (HCC)    Dysrhythmia    GERD (gastroesophageal reflux disease)    HLD (hyperlipidemia)    Hypertension    S/P TAVR (transcatheter aortic valve replacement) 07/05/2021   with Edwards 26mm S3UR via TF approach with Dr. Excell Seltzer and Dr. Laneta Simmers   Seizures [facility])      Past Surgical History:  Procedure Laterality Date   BYPASS GRAFT ANGIOGRAPHY N/A 04/25/2021   Procedure: BYPASS GRAFT ANGIOGRAPHY;  Surgeon: Armando Reichert, MD;  Location: [facility] INVASIVE CV LAB;  Service: Cardiovascular;  Laterality: N/A;   CARDIAC SURGERY     CORNEAL TRANSPLANT Right    CORONARY ARTERY BYPASS GRAFT  1997   ELECTROPHYSIOLOGY STUDY N/A 09/08/2021   Procedure: ELECTROPHYSIOLOGY STUDY;  Surgeon: Lanier Prude, MD;  Location: [facility] INVASIVE CV LAB;  Service: Cardiovascular;  Laterality: N/A;   EYE SURGERY     FLEXOR TENDON REPAIR Left 07/11/2019   Procedure: FLEXOR tenolysis  REPAIR LEFT RING FINGER with tednon repair;  Surgeon: Kennedy Bucker, MD;  Location: [facility] ORS;  Service: Orthopedics;  Laterality: Left;   INCISION AND DRAINAGE ABSCESS Left 05/08/2019   Procedure: INCISION AND DRAINAGE ABSCESS;  Surgeon: Donato Heinz, MD;  Location: [facility]  ORS;  Service: Orthopedics;  Laterality: Left;   INTRAOPERATIVE TRANSTHORACIC ECHOCARDIOGRAM N/A 07/05/2021   Procedure: INTRAOPERATIVE TRANSTHORACIC ECHOCARDIOGRAM;  Surgeon: Tonny Bollman, MD;  Location: [facility] OR;  Service: Open Heart Surgery;  Laterality: N/A;   LEFT HEART CATH AND CORONARY ANGIOGRAPHY Left 06/09/2022   Procedure: LEFT HEART CATH AND CORONARY ANGIOGRAPHY;  Surgeon: Laurier Nancy, MD;  Location: [facility] INVASIVE CV LAB;  Service: Cardiovascular;  Laterality: Left;   LEFT HEART CATH AND CORS/GRAFTS ANGIOGRAPHY N/A 10/02/2019   Procedure: LEFT HEART CATH AND CORS/GRAFTS ANGIOGRAPHY;  Surgeon: Marykay Lex, MD;  Location: [facility] INVASIVE CV LAB;  Service: Cardiovascular;  Laterality: N/A;   LEFT HEART CATH AND CORS/GRAFTS ANGIOGRAPHY N/A 07/17/2022   Procedure: LEFT HEART CATH AND CORS/GRAFTS ANGIOGRAPHY and possible PCI and stent;  Surgeon: Laurier Nancy, MD;  Location: [facility] INVASIVE CV LAB;  Service: Cardiovascular;  Laterality: N/A;   LOOP RECORDER INSERTION N/A 10/06/2019   Procedure: LOOP RECORDER INSERTION;  Surgeon: Marinus Maw, MD;  Location: [facility] INVASIVE CV LAB;  Service: Cardiovascular;  Laterality: N/A;   TRANSCATHETER AORTIC VALVE REPLACEMENT, TRANSFEMORAL N/A 07/05/2021   Procedure: TRANSCATHETER AORTIC VALVE REPLACEMENT, TRANSFEMORAL;  Surgeon: Tonny Bollman, MD;  Location: [facility] OR;  Service: Open Heart Surgery;  Laterality: N/A;   TRIGGER FINGER RELEASE Left      Current Outpatient  Medications  Medication Sig Dispense Refill   acetaminophen (TYLENOL) 650 MG CR tablet Take 1,300 mg by mouth every 8 (eight) hours as needed for pain.     amiodarone (PACERONE) 200 MG tablet Take 1 tablet (200 mg total) by mouth daily. 30 tablet 3   apixaban (ELIQUIS) 5 MG TABS tablet Take 1 tablet (5 mg total) by mouth 2 (two) times daily. 180 tablet 1   atorvastatin (LIPITOR) 80 MG tablet Take 1 tablet (80 mg total) by mouth at bedtime. 30 tablet 1   Calcium Citrate-Vitamin D (CALCIUM  + D PO) Take 1 tablet by mouth 2 (two) times daily.     clopidogrel (PLAVIX) 75 MG tablet Take 1 tablet (75 mg total) by mouth daily. 30 tablet 11   dapagliflozin propanediol (FARXIGA) 10 MG TABS tablet Take 1 tablet (10 mg total) by mouth daily before breakfast. 30 tablet 5   divalproex (DEPAKOTE ER) 250 MG 24 hr tablet Take 1 tablet (250 mg total) by mouth at bedtime. 30 tablet 0   doxycycline (VIBRAMYCIN) 100 MG capsule Take 1 capsule by mouth 2 (two) times daily.     Dulaglutide 3 MG/0.5ML SOPN Inject 3 mg into the skin once a week.     furosemide (LASIX) 20 MG tablet Take 1 tablet (20 mg total) by mouth daily. 30 tablet 1   isosorbide mononitrate (IMDUR) 30 MG 24 hr tablet Take 30 mg by mouth daily.     metoprolol succinate (TOPROL-XL) 25 MG 24 hr tablet Take 0.5 tablets (12.5 mg total) by mouth daily. 30 tablet 0   Multiple Vitamin (MULTIVITAMIN) capsule Take 1 capsule by mouth daily.     nitroGLYCERIN (NITROSTAT) 0.4 MG SL tablet Place 0.4 mg under the tongue every 5 (five) minutes as needed for chest pain.     pantoprazole (PROTONIX) 40 MG tablet Take 40 mg by mouth daily.     No current facility-administered medications for this visit.    Allergies:   Patient has no known allergies.    Social History:   reports that he quit smoking about 40 years ago. His smoking use included cigarettes. He has never used smokeless tobacco. He reports that he does not currently use alcohol. He reports that he does not use drugs.   Family History:  family history includes Seizures in his brother.    ROS:     Review of Systems  Constitutional: Negative.   HENT: Negative.    Eyes: Negative.   Respiratory: Negative.    Gastrointestinal: Negative.   Genitourinary: Negative.   Musculoskeletal: Negative.   Skin: Negative.   Neurological: Negative.   Endo/Heme/Allergies: Negative.   Psychiatric/Behavioral: Negative.    All other systems reviewed and are negative.     All other systems are  reviewed and negative.    PHYSICAL EXAM: VS:  BP (!) 118/58   Pulse (!) 58   Ht 5\' 3"  (1.6 m)   Wt 133 lb (60.3 kg)   SpO2 98%   BMI 23.56 kg/m  , BMI Body mass index is 23.56 kg/m. Last weight:  Wt Readings from Last 3 Encounters:  11/16/22 133 lb (60.3 kg)  11/14/22 133 lb 13.1 oz (60.7 kg)  11/14/22 134 lb (60.8 kg)     Physical Exam Vitals reviewed.  Constitutional:      Appearance: Normal appearance. He is normal weight.  HENT:     Head: Normocephalic.     Nose: Nose normal.     Mouth/Throat:  Mouth: Mucous membranes are moist.  Eyes:     Pupils: Pupils are equal, round, and reactive to light.  Cardiovascular:     Rate and Rhythm: Normal rate and regular rhythm.     Pulses: Normal pulses.     Heart sounds: Normal heart sounds.  Pulmonary:     Effort: Pulmonary effort is normal.  Abdominal:     General: Abdomen is flat. Bowel sounds are normal.  Musculoskeletal:        General: Normal range of motion.     Cervical back: Normal range of motion.  Skin:    General: Skin is warm.  Neurological:     General: No focal deficit present.     Mental Status: He is alert.  Psychiatric:        Mood and Affect: Mood normal.       EKG: NSR ischaemia inferolaterally on EKG in ER  Recent Labs: 07/16/2022: Magnesium 2.0 10/02/2022: B Natriuretic Peptide 531.2 11/14/2022: ALT 12; BUN 39; Creatinine, Ser 1.96; Hemoglobin 8.5; Platelets 256; Potassium 3.7; Sodium 131    Lipid Panel    Component Value Date/Time   CHOL 141 05/27/2022 0520   CHOL 143 02/25/2020 1126   TRIG 159 (H) 05/27/2022 0520   HDL 32 (L) 05/27/2022 0520   HDL 31 (L) 02/25/2020 1126   CHOLHDL 4.4 05/27/2022 0520   VLDL 32 05/27/2022 0520   LDLCALC 77 05/27/2022 0520   LDLCALC 78 02/25/2020 1126      Other studies Reviewed: Additional studies/ records that were reviewed today include:  Review of the above records demonstrates:       No data to display            ASSESSMENT AND  PLAN:    ICD-10-CM   1. Bilateral carotid artery stenosis  I65.23 MYOCARDIAL PERFUSION IMAGING    PCV ECHOCARDIOGRAM COMPLETE    2. Chronic combined systolic and diastolic congestive heart failure (HCC)  I50.42 MYOCARDIAL PERFUSION IMAGING    PCV ECHOCARDIOGRAM COMPLETE    3. Coronary artery disease with hx of myocardial infarct w/o hx of CABG  I25.10 MYOCARDIAL PERFUSION IMAGING   I25.2 PCV ECHOCARDIOGRAM COMPLETE    4. Essential hypertension  I10 MYOCARDIAL PERFUSION IMAGING    PCV ECHOCARDIOGRAM COMPLETE    5. Severe aortic stenosis  I35.0 MYOCARDIAL PERFUSION IMAGING    PCV ECHOCARDIOGRAM COMPLETE    6. Vasovagal syncope  R55 MYOCARDIAL PERFUSION IMAGING    PCV ECHOCARDIOGRAM COMPLETE   Became unresponsive 2 days ago and had creat 1.9, dehydrated VS cardiac cause. Advise echo, stress test to r/o CAD causing ischaemia       Problem List Items Addressed This Visit       Cardiovascular and Mediastinum   Essential hypertension   Relevant Orders   MYOCARDIAL PERFUSION IMAGING   PCV ECHOCARDIOGRAM COMPLETE   Bilateral carotid artery disease (HCC) - Primary   Relevant Orders   MYOCARDIAL PERFUSION IMAGING   PCV ECHOCARDIOGRAM COMPLETE   Coronary artery disease with hx of myocardial infarct w/o hx of CABG   Relevant Orders   MYOCARDIAL PERFUSION IMAGING   PCV ECHOCARDIOGRAM COMPLETE   Severe aortic stenosis   Relevant Orders   MYOCARDIAL PERFUSION IMAGING   PCV ECHOCARDIOGRAM COMPLETE   Chronic combined systolic and diastolic congestive heart failure (HCC)   Relevant Orders   MYOCARDIAL PERFUSION IMAGING   PCV ECHOCARDIOGRAM COMPLETE   Other Visit Diagnoses     Vasovagal syncope       Became  unresponsive 2 days ago and had creat 1.9, dehydrated VS cardiac cause. Advise echo, stress test to r/o CAD causing ischaemia   Relevant Orders   MYOCARDIAL PERFUSION IMAGING   PCV ECHOCARDIOGRAM COMPLETE          Disposition:   Return in about 2 weeks (around 11/30/2022)  for echo, stress test and f/u.    Total time spent: 30 minutes  Signed,  Adrian Blackwater, MD  11/16/2022 10:02 AM    [facility]

## 2022-11-21 NOTE — Progress Notes (Signed)
Carelink Summary Report / Loop Recorder 

## 2022-11-22 ENCOUNTER — Ambulatory Visit (INDEPENDENT_AMBULATORY_CARE_PROVIDER_SITE_OTHER): Payer: Self-pay

## 2022-11-22 DIAGNOSIS — I351 Nonrheumatic aortic (valve) insufficiency: Secondary | ICD-10-CM | POA: Diagnosis not present

## 2022-11-22 DIAGNOSIS — I340 Nonrheumatic mitral (valve) insufficiency: Secondary | ICD-10-CM

## 2022-11-22 DIAGNOSIS — I5042 Chronic combined systolic (congestive) and diastolic (congestive) heart failure: Secondary | ICD-10-CM

## 2022-11-22 DIAGNOSIS — I10 Essential (primary) hypertension: Secondary | ICD-10-CM

## 2022-11-22 DIAGNOSIS — I6523 Occlusion and stenosis of bilateral carotid arteries: Secondary | ICD-10-CM

## 2022-11-22 DIAGNOSIS — I350 Nonrheumatic aortic (valve) stenosis: Secondary | ICD-10-CM

## 2022-11-22 DIAGNOSIS — I2510 Atherosclerotic heart disease of native coronary artery without angina pectoris: Secondary | ICD-10-CM

## 2022-11-22 DIAGNOSIS — R55 Syncope and collapse: Secondary | ICD-10-CM

## 2022-11-23 ENCOUNTER — Ambulatory Visit: Payer: Self-pay | Admitting: Cardiovascular Disease

## 2022-11-23 ENCOUNTER — Ambulatory Visit (INDEPENDENT_AMBULATORY_CARE_PROVIDER_SITE_OTHER): Payer: Self-pay

## 2022-11-23 DIAGNOSIS — I10 Essential (primary) hypertension: Secondary | ICD-10-CM

## 2022-11-23 DIAGNOSIS — I5042 Chronic combined systolic (congestive) and diastolic (congestive) heart failure: Secondary | ICD-10-CM | POA: Diagnosis not present

## 2022-11-23 DIAGNOSIS — R55 Syncope and collapse: Secondary | ICD-10-CM

## 2022-11-23 DIAGNOSIS — I6523 Occlusion and stenosis of bilateral carotid arteries: Secondary | ICD-10-CM | POA: Diagnosis not present

## 2022-11-23 DIAGNOSIS — I2510 Atherosclerotic heart disease of native coronary artery without angina pectoris: Secondary | ICD-10-CM

## 2022-11-23 DIAGNOSIS — I350 Nonrheumatic aortic (valve) stenosis: Secondary | ICD-10-CM | POA: Diagnosis not present

## 2022-11-23 MED ORDER — TECHNETIUM TC 99M SESTAMIBI GENERIC - CARDIOLITE
9.8000 | Freq: Once | INTRAVENOUS | Status: AC | PRN
Start: 2022-11-23 — End: 2022-11-23
  Administered 2022-11-23: 9.8 via INTRAVENOUS

## 2022-11-23 MED ORDER — TECHNETIUM TC 99M SESTAMIBI GENERIC - CARDIOLITE
33.3000 | Freq: Once | INTRAVENOUS | Status: AC | PRN
Start: 2022-11-23 — End: 2022-11-23
  Administered 2022-11-23: 33.3 via INTRAVENOUS

## 2022-11-27 ENCOUNTER — Other Ambulatory Visit: Payer: Self-pay

## 2022-11-27 ENCOUNTER — Emergency Department
Admission: EM | Admit: 2022-11-27 | Discharge: 2022-11-27 | Disposition: A | Discharge status: Home / Self Care | Payer: Medicare HMO | Attending: Emergency Medicine | Admitting: Emergency Medicine

## 2022-11-27 DIAGNOSIS — E860 Dehydration: Secondary | ICD-10-CM | POA: Insufficient documentation

## 2022-11-27 DIAGNOSIS — R531 Weakness: Secondary | ICD-10-CM | POA: Diagnosis not present

## 2022-11-27 DIAGNOSIS — R739 Hyperglycemia, unspecified: Secondary | ICD-10-CM | POA: Diagnosis not present

## 2022-11-27 DIAGNOSIS — I959 Hypotension, unspecified: Secondary | ICD-10-CM | POA: Diagnosis not present

## 2022-11-27 LAB — CBC WITH DIFFERENTIAL/PLATELET
Abs Immature Granulocytes: 0.02 10*3/uL (ref 0.00–0.07)
Basophils Absolute: 0 10*3/uL (ref 0.0–0.1)
Basophils Relative: 0 %
Eosinophils Absolute: 0.2 10*3/uL (ref 0.0–0.5)
Eosinophils Relative: 4 %
HCT: 30.9 % — ABNORMAL LOW (ref 39.0–52.0)
Hemoglobin: 9 g/dL — ABNORMAL LOW (ref 13.0–17.0)
Immature Granulocytes: 0 %
Lymphocytes Relative: 11 %
Lymphs Abs: 0.5 10*3/uL — ABNORMAL LOW (ref 0.7–4.0)
MCH: 23.2 pg — ABNORMAL LOW (ref 26.0–34.0)
MCHC: 29.1 g/dL — ABNORMAL LOW (ref 30.0–36.0)
MCV: 79.6 fL — ABNORMAL LOW (ref 80.0–100.0)
Monocytes Absolute: 0.4 10*3/uL (ref 0.1–1.0)
Monocytes Relative: 8 %
Neutro Abs: 3.6 10*3/uL (ref 1.7–7.7)
Neutrophils Relative %: 77 %
Platelets: 172 10*3/uL (ref 150–400)
RBC: 3.88 MIL/uL — ABNORMAL LOW (ref 4.22–5.81)
RDW: 17.5 % — ABNORMAL HIGH (ref 11.5–15.5)
WBC: 4.8 10*3/uL (ref 4.0–10.5)
nRBC: 0 % (ref 0.0–0.2)

## 2022-11-27 LAB — TROPONIN I (HIGH SENSITIVITY)
Troponin I (High Sensitivity): 16 ng/L (ref <18)
Troponin I (High Sensitivity): 16 ng/L (ref <18)

## 2022-11-27 LAB — BASIC METABOLIC PANEL
Anion gap: 9 (ref 5–15)
BUN: 30 mg/dL — ABNORMAL HIGH (ref 8–23)
CO2: 27 mmol/L (ref 22–32)
Calcium: 9.2 mg/dL (ref 8.9–10.3)
Chloride: 101 mmol/L (ref 98–111)
Creatinine, Ser: 1.49 mg/dL — ABNORMAL HIGH (ref 0.61–1.24)
GFR, Estimated: 51 mL/min — ABNORMAL LOW (ref >60)
Glucose, Bld: 310 mg/dL — ABNORMAL HIGH (ref 70–99)
Potassium: 4.3 mmol/L (ref 3.5–5.1)
Sodium: 137 mmol/L (ref 135–145)

## 2022-11-27 LAB — CBG MONITORING, ED: Glucose-Capillary: 297 mg/dL — ABNORMAL HIGH (ref 70–99)

## 2022-11-27 MED ORDER — SODIUM CHLORIDE 0.9 % IV BOLUS
1000.0000 mL | Freq: Once | INTRAVENOUS | Status: AC
  Administered 2022-11-27: 1000 mL via INTRAVENOUS

## 2022-11-27 NOTE — ED Triage Notes (Signed)
Pt presents to ED via EMS from home with complaints of tremors and weakness. Per EMS weakness and tremors have been going on for 45 mins. Per EMS, CBG 345.   EMS vitals BP 170/90 HR 70

## 2022-11-27 NOTE — Discharge Instructions (Signed)
Please seek medical attention for any high fevers, chest pain, shortness of breath, change in behavior, persistent vomiting, bloody stool or any other new or concerning symptoms.  

## 2022-11-27 NOTE — ED Provider Notes (Signed)
[facility] Provider Note    Event Date/Time   First MD Initiated Contact with Patient 11/27/22 [phone]     (approximate)   History   Weakness   HPI  Thomas Moore is a 67 y.o. male who presents to the emergency department today because of concerns for weakness and shaking.  The patient's symptoms started this morning.  He says that he had felt a little tired yesterday but not nearly as weak as today.  The patient noticed that he did have some tremors.  The patient says that this has happened to him before.  He says that he has been seen and evaluated for it in the past without any clear etiology found.  He denies any fevers.  Denies any chest pain or palpitations.     Physical Exam   Triage Vital Signs: ED Triage Vitals  Enc Vitals Group     BP 11/27/22 0900 132/61     Pulse Rate 11/27/22 0900 68     Resp 11/27/22 0900 16     Temp 11/27/22 0900 97.8 F (36.6 C)     Temp Source 11/27/22 0900 Oral     SpO2 11/27/22 0900 100 %     Weight 11/27/22 0902 135 lb (61.2 kg)     Height 11/27/22 0902 5\' 3"  (1.6 m)     Head Circumference --      Peak Flow --      Pain Score 11/27/22 0901 0     Pain Loc --      Pain Edu? --      Excl. in GC? --     Most recent vital signs: Vitals:   11/27/22 0900  BP: 132/61  Pulse: 68  Resp: 16  Temp: 97.8 F (36.6 C)  SpO2: 100%   General: Awake, alert, oriented. CV:  Good peripheral perfusion. Regular rate and rhythm. Resp:  Normal effort. Normal Abd:  No distention.     ED Results / Procedures / Treatments   Labs (all labs ordered are listed, but only abnormal results are displayed) Labs Reviewed  CBC WITH DIFFERENTIAL/PLATELET - Abnormal; Notable for the following components:      Result Value   RBC 3.88 (*)    Hemoglobin 9.0 (*)    HCT 30.9 (*)    MCV 79.6 (*)    MCH 23.2 (*)    MCHC 29.1 (*)    RDW 17.5 (*)    Lymphs Abs 0.5 (*)    All other components within normal limits  BASIC METABOLIC  PANEL - Abnormal; Notable for the following components:   Glucose, Bld 310 (*)    BUN 30 (*)    Creatinine, Ser 1.49 (*)    GFR, Estimated 51 (*)    All other components within normal limits  CBG MONITORING, ED - Abnormal; Notable for the following components:   Glucose-Capillary 297 (*)    All other components within normal limits  TROPONIN I (HIGH SENSITIVITY)  TROPONIN I (HIGH SENSITIVITY)     EKG  I, Phineas Semen, attending physician, personally viewed and interpreted this EKG  EKG Time: 0858 Rate: 69 Rhythm: sinus rhythm Axis: normal Intervals: qtc 450 QRS: narrow, q waves v1 ST changes: no st elevation Impression: abnormal ekg   RADIOLOGY NOne   PROCEDURES:  Critical Care performed: No   MEDICATIONS ORDERED IN ED: Medications - No data to display   IMPRESSION / MDM / ASSESSMENT AND PLAN / ED COURSE  I  reviewed the triage vital signs and the nursing notes.                              Differential diagnosis includes, but is not limited to, anemia, electrolyte abnormality, dehydration, infection  Patient's presentation is most consistent with acute presentation with potential threat to life or bodily function.   The patient is on the cardiac monitor to evaluate for evidence of arrhythmia and/or significant heart rate changes.  Patient presented to the emergency department today because of concerns for weakness and trembling.  At the time my exam no trembling apparent.  Blood work here without concerning leukocytosis.  Patient is anemic however this is similar to his baseline.  Additionally slight elevation of creatinine which is at his baseline.  Glucose however was elevated.  He was given IV fluids and did feel better after IV fluids.  At this time I think it be reasonable for patient be discharged home.  Will have patient follow-up with primary care.      FINAL CLINICAL IMPRESSION(S) / ED DIAGNOSES   Final diagnoses:  Dehydration  Weakness      Note:  This document was prepared using Dragon voice recognition software and may include unintentional dictation errors.    Phineas Semen, MD 11/27/22 [phone]

## 2022-11-27 NOTE — Inpatient Diabetes Management (Signed)
Inpatient Diabetes Program Recommendations  AACE/ADA: New Consensus Statement on Inpatient Glycemic Control   Target Ranges:  Prepandial:   less than 140 mg/dL      Peak postprandial:   less than 180 mg/dL (1-2 hours)      Critically ill patients:  140 - 180 mg/dL    Latest Reference Range & Units 11/27/22 09:02  Glucose-Capillary 70 - 99 mg/dL 161 (H)    Latest Reference Range & Units 11/27/22 09:31  Glucose 70 - 99 mg/dL 096 (H)   Review of Glycemic Control  Diabetes history: DM2 Outpatient Diabetes medications: Farxiga 10 mg daily, Trulicity 3 mg Qweek Current orders for Inpatient glycemic control: None; in ED  Inpatient Diabetes Program Recommendations:    Insulin: If patient is admitted, please consider ordering CBGs AC&HS, Novolog 0-15 units TID with meals, and Novolog 0-5 units at bedtime.   Thanks, Orlando Penner, RN, MSN, CDCES Diabetes Coordinator Inpatient Diabetes Program [phone] (Team Pager from 8am to 5pm)

## 2022-11-27 NOTE — ED Notes (Signed)
Pt ambulatory to toilet without assistance 

## 2022-11-30 ENCOUNTER — Ambulatory Visit: Payer: Self-pay | Admitting: Cardiovascular Disease

## 2022-12-06 ENCOUNTER — Ambulatory Visit (INDEPENDENT_AMBULATORY_CARE_PROVIDER_SITE_OTHER): Payer: Medicare HMO

## 2022-12-06 DIAGNOSIS — I472 Ventricular tachycardia, unspecified: Secondary | ICD-10-CM | POA: Diagnosis not present

## 2022-12-08 LAB — CUP PACEART REMOTE DEVICE CHECK
Date Time Interrogation Session: 20240703230320
Implantable Pulse Generator Implant Date: 20210503

## 2022-12-28 NOTE — Progress Notes (Signed)
Carelink Summary Report / Loop Recorder 

## 2023-01-08 ENCOUNTER — Ambulatory Visit: Payer: Medicare PPO

## 2023-01-08 DIAGNOSIS — R55 Syncope and collapse: Secondary | ICD-10-CM

## 2023-01-15 ENCOUNTER — Other Ambulatory Visit: Payer: Self-pay

## 2023-01-15 ENCOUNTER — Emergency Department
Admission: EM | Admit: 2023-01-15 | Discharge: 2023-01-15 | Disposition: A | Discharge status: Home / Self Care | Payer: 59 | Attending: Emergency Medicine | Admitting: Emergency Medicine

## 2023-01-15 ENCOUNTER — Emergency Department: Payer: 59

## 2023-01-15 DIAGNOSIS — I4891 Unspecified atrial fibrillation: Secondary | ICD-10-CM | POA: Diagnosis not present

## 2023-01-15 DIAGNOSIS — Z951 Presence of aortocoronary bypass graft: Secondary | ICD-10-CM | POA: Diagnosis not present

## 2023-01-15 DIAGNOSIS — E119 Type 2 diabetes mellitus without complications: Secondary | ICD-10-CM | POA: Diagnosis not present

## 2023-01-15 DIAGNOSIS — I509 Heart failure, unspecified: Secondary | ICD-10-CM | POA: Insufficient documentation

## 2023-01-15 DIAGNOSIS — Z7982 Long term (current) use of aspirin: Secondary | ICD-10-CM | POA: Insufficient documentation

## 2023-01-15 DIAGNOSIS — I110 Hypertensive heart disease with heart failure: Secondary | ICD-10-CM | POA: Diagnosis not present

## 2023-01-15 DIAGNOSIS — R079 Chest pain, unspecified: Secondary | ICD-10-CM | POA: Diagnosis present

## 2023-01-15 DIAGNOSIS — R1110 Vomiting, unspecified: Secondary | ICD-10-CM | POA: Diagnosis not present

## 2023-01-15 DIAGNOSIS — R791 Abnormal coagulation profile: Secondary | ICD-10-CM | POA: Insufficient documentation

## 2023-01-15 DIAGNOSIS — Z7901 Long term (current) use of anticoagulants: Secondary | ICD-10-CM | POA: Insufficient documentation

## 2023-01-15 DIAGNOSIS — R1010 Upper abdominal pain, unspecified: Secondary | ICD-10-CM | POA: Insufficient documentation

## 2023-01-15 LAB — CBC
HCT: 35 % — ABNORMAL LOW (ref 39.0–52.0)
Hemoglobin: 9.9 g/dL — ABNORMAL LOW (ref 13.0–17.0)
MCH: 22.9 pg — ABNORMAL LOW (ref 26.0–34.0)
MCHC: 28.3 g/dL — ABNORMAL LOW (ref 30.0–36.0)
MCV: 80.8 fL (ref 80.0–100.0)
Platelets: 198 10*3/uL (ref 150–400)
RBC: 4.33 MIL/uL (ref 4.22–5.81)
RDW: 19.4 % — ABNORMAL HIGH (ref 11.5–15.5)
WBC: 6.8 10*3/uL (ref 4.0–10.5)
nRBC: 0 % (ref 0.0–0.2)

## 2023-01-15 LAB — HEPATIC FUNCTION PANEL
ALT: 14 U/L (ref 0–44)
AST: 18 U/L (ref 15–41)
Albumin: 3.6 g/dL (ref 3.5–5.0)
Alkaline Phosphatase: 99 U/L (ref 38–126)
Bilirubin, Direct: <0.1 mg/dL (ref 0.0–0.2)
Total Bilirubin: 0.6 mg/dL (ref 0.3–1.2)
Total Protein: 8.6 g/dL — ABNORMAL HIGH (ref 6.5–8.1)

## 2023-01-15 LAB — BASIC METABOLIC PANEL
Anion gap: 10 (ref 5–15)
BUN: 30 mg/dL — ABNORMAL HIGH (ref 8–23)
CO2: 27 mmol/L (ref 22–32)
Calcium: 9.3 mg/dL (ref 8.9–10.3)
Chloride: 103 mmol/L (ref 98–111)
Creatinine, Ser: 1.55 mg/dL — ABNORMAL HIGH (ref 0.61–1.24)
GFR, Estimated: 49 mL/min — ABNORMAL LOW (ref >60)
Glucose, Bld: 188 mg/dL — ABNORMAL HIGH (ref 70–99)
Potassium: 4.4 mmol/L (ref 3.5–5.1)
Sodium: 140 mmol/L (ref 135–145)

## 2023-01-15 LAB — PROTIME-INR
INR: 1.3 — ABNORMAL HIGH (ref 0.8–1.2)
Prothrombin Time: 16.8 seconds — ABNORMAL HIGH (ref 11.4–15.2)

## 2023-01-15 LAB — TROPONIN I (HIGH SENSITIVITY)
Troponin I (High Sensitivity): 22 ng/L — ABNORMAL HIGH (ref <18)
Troponin I (High Sensitivity): 21 ng/L — ABNORMAL HIGH (ref <18)

## 2023-01-15 LAB — LIPASE, BLOOD: Lipase: 57 U/L — ABNORMAL HIGH (ref 11–51)

## 2023-01-15 NOTE — ED Provider Notes (Signed)
[facility] Provider Note    Event Date/Time   First MD Initiated Contact with Patient 01/15/23 1742     (approximate)   History   Chest Pain   HPI  Thomas Moore is a 67 y.o. male   Past medical history of cardiac history including previous MIs, CABG, CHF, aortic stenosis status post TAVR, CVA, diabetes, GERD, hypertension hyperlipidemia, seizures who presents to the emergency department with chest pain.  This occurred this evening while at rest.  Associated with vomiting.  Chest pain/upper abdominal pain.  Lasted about 30 minutes now completely subsided.  Has otherwise been in his regular state of health with no recent illnesses.  Denies any changes in bowel movements, GI bleeding, urinary symptoms.  He denies any respiratory symptoms like shortness of breath, cough.  Fully compliant with all medications.  He takes Eliquis for A-fib.  He was given 1 nitroglycerin and aspirin by EMS.  External Medical Documents Reviewed:  Cardiology office visit dated December 2023 documenting his past cardiac history      Physical Exam   Triage Vital Signs: ED Triage Vitals  Encounter Vitals Group     BP 01/15/23 1630 (!) 118/58     Systolic BP Percentile --      Diastolic BP Percentile --      Pulse Rate 01/15/23 1630 72     Resp 01/15/23 1630 16     Temp 01/15/23 1630 98.9 F (37.2 C)     Temp Source 01/15/23 1630 Oral     SpO2 01/15/23 1630 97 %     Weight 01/15/23 1637 138 lb (62.6 kg)     Height 01/15/23 1637 5\' 3"  (1.6 m)     Head Circumference --      Peak Flow --      Pain Score 01/15/23 1637 5     Pain Loc --      Pain Education --      Exclude from Growth Chart --     Most recent vital signs: Vitals:   01/15/23 1630 01/15/23 1900  BP: (!) 118/58 119/60  Pulse: 72 75  Resp: 16 18  Temp: 98.9 F (37.2 C)   SpO2: 97% 97%    General: Awake, no distress.  CV:  Good peripheral perfusion.  Resp:  Normal effort.  Abd:  No distention.   Other:  Comfortable appearing with normal hemodynamics.  Lungs clear.  He appears euvolemic.  Radial pulses intact and equal bilaterally.  He has some mild epigastric and right upper quadrant tenderness to palpation without rigidity or guarding.   ED Results / Procedures / Treatments   Labs (all labs ordered are listed, but only abnormal results are displayed) Labs Reviewed  BASIC METABOLIC PANEL - Abnormal; Notable for the following components:      Result Value   Glucose, Bld 188 (*)    BUN 30 (*)    Creatinine, Ser 1.55 (*)    GFR, Estimated 49 (*)    All other components within normal limits  CBC - Abnormal; Notable for the following components:   Hemoglobin 9.9 (*)    HCT 35.0 (*)    MCH 22.9 (*)    MCHC 28.3 (*)    RDW 19.4 (*)    All other components within normal limits  PROTIME-INR - Abnormal; Notable for the following components:   Prothrombin Time 16.8 (*)    INR 1.3 (*)    All other components within normal limits  HEPATIC FUNCTION PANEL - Abnormal; Notable for the following components:   Total Protein 8.6 (*)    All other components within normal limits  LIPASE, BLOOD - Abnormal; Notable for the following components:   Lipase 57 (*)    All other components within normal limits  TROPONIN I (HIGH SENSITIVITY) - Abnormal; Notable for the following components:   Troponin I (High Sensitivity) 22 (*)    All other components within normal limits  TROPONIN I (HIGH SENSITIVITY) - Abnormal; Notable for the following components:   Troponin I (High Sensitivity) 21 (*)    All other components within normal limits     I ordered and reviewed the above labs they are notable for white blood cell count LFTs and lipase unremarkable.  His initial troponin is 22 close to his baseline.  EKG  ED ECG REPORT I, Pilar Jarvis, the attending physician, personally viewed and interpreted this ECG.   Date: 01/15/2023  EKG Time: 1632  Rate: 71  Rhythm: sinus  Axis: nl   Intervals:none  ST&T Change: No STEMI    RADIOLOGY I independently reviewed and interpreted chest x-ray and see no obvious focality or pneumothorax I also reviewed radiologist's formal read.   PROCEDURES:  Critical Care performed: No  Procedures   MEDICATIONS ORDERED IN ED: Medications - No data to display    IMPRESSION / MDM / ASSESSMENT AND PLAN / ED COURSE  I reviewed the triage vital signs and the nursing notes.                                Patient's presentation is most consistent with acute presentation with potential threat to life or bodily function.  Differential diagnosis includes, but is not limited to, ACS, PE, dissection, gastritis/GERD/dyspepsia, cholecystitis, biliary colic, pancreatitis   The patient is on the cardiac monitor to evaluate for evidence of arrhythmia and/or significant heart rate changes.  MDM:    This patient with chest pain extensive cardiac history concern for ACS with associated nausea and vomiting.  Asymptomatic now, nonischemic EKG and troponin initially is at baseline.  Follow-up with serial troponin.  Doubt PE given anticoagulated status compliant with medications.  Doubt dissection given characteristic of pain not consistent with this diagnosis.  Consider upper abdominal pathologies like gastritis or cholecystitis or biliary pathology.  Check LFTs and lipase.  Check right upper quadrant ultrasound.  Given his asymptomatic status now, I think if workup is unremarkable he can be discharged with close PMD and cardiology follow-up.   -- Workup above is negative.  Patient completely asymptomatic, during the entirety of his ED stay.  Plan is for discharge with PMD and cardiology follow-up and return with any new or worsening.        FINAL CLINICAL IMPRESSION(S) / ED DIAGNOSES   Final diagnoses:  Nonspecific chest pain     Rx / DC Orders   ED Discharge Orders     None        Note:  This document was prepared  using Dragon voice recognition software and may include unintentional dictation errors.    Pilar Jarvis, MD 01/15/23 2139

## 2023-01-15 NOTE — ED Triage Notes (Signed)
Pt sts that he was resting outside and than started to have CP. Pt sts that than he vomited.

## 2023-01-15 NOTE — ED Notes (Signed)
First Nurse Note: Patient to ED via [facility] from home for centralized CP that started approx 30 minutes ago. Also havd 1 episode of vomiting. Given 1 nitroglycerin and aspirin by EMS. 18 L Hand

## 2023-01-15 NOTE — Discharge Instructions (Addendum)
Fortunately your testing today did not show any signs of blockages, infections in your abdomen or any signs of a heart attack to explain your symptoms.  Please follow-up with your primary doctor and your cardiologist this week for an appointment.   Thank you for choosing [place] for your health care today!  Please see your primary doctor this week for a follow up appointment.   If you have any new, worsening, or unexpected symptoms call your doctor right away or come back to the emergency department for reevaluation.  It was my pleasure to care for you today.   Daneil Dan Modesto Charon, MD

## 2023-01-19 NOTE — Progress Notes (Signed)
Carelink Summary Report / Loop Recorder 

## 2023-01-25 ENCOUNTER — Ambulatory Visit (INDEPENDENT_AMBULATORY_CARE_PROVIDER_SITE_OTHER): Payer: 59 | Admitting: Cardiovascular Disease

## 2023-01-25 ENCOUNTER — Encounter: Payer: Self-pay | Admitting: Cardiovascular Disease

## 2023-01-25 VITALS — BP 122/62 | HR 73 | Ht 63.0 in | Wt 136.2 lb

## 2023-01-25 DIAGNOSIS — I214 Non-ST elevation (NSTEMI) myocardial infarction: Secondary | ICD-10-CM

## 2023-01-25 DIAGNOSIS — I252 Old myocardial infarction: Secondary | ICD-10-CM

## 2023-01-25 DIAGNOSIS — I951 Orthostatic hypotension: Secondary | ICD-10-CM | POA: Diagnosis not present

## 2023-01-25 DIAGNOSIS — I480 Paroxysmal atrial fibrillation: Secondary | ICD-10-CM

## 2023-01-25 DIAGNOSIS — I6523 Occlusion and stenosis of bilateral carotid arteries: Secondary | ICD-10-CM

## 2023-01-25 DIAGNOSIS — I10 Essential (primary) hypertension: Secondary | ICD-10-CM

## 2023-01-25 DIAGNOSIS — I350 Nonrheumatic aortic (valve) stenosis: Secondary | ICD-10-CM

## 2023-01-25 DIAGNOSIS — I5042 Chronic combined systolic (congestive) and diastolic (congestive) heart failure: Secondary | ICD-10-CM

## 2023-01-25 DIAGNOSIS — I2510 Atherosclerotic heart disease of native coronary artery without angina pectoris: Secondary | ICD-10-CM | POA: Diagnosis not present

## 2023-01-25 MED ORDER — ISOSORBIDE MONONITRATE ER 30 MG PO TB24: ORAL_TABLET | ORAL | 1 refills | Status: DC

## 2023-01-25 NOTE — Progress Notes (Signed)
Cardiology Office Note   Date:  01/25/2023   ID:  Thomas Moore, DOB 08-13-1955, MRN [id number]  PCP:  Lauro Regulus, MD  Cardiologist:  Adrian Blackwater, MD      History of Present Illness: Thomas Moore is a 67 y.o. male who presents for  Chief Complaint  Patient presents with   Follow-up    Feel dizzy occasionally as BP may be low      Past Medical History:  Diagnosis Date   CAD (coronary artery disease) of artery bypass graft    s/p CABG x 4 in 1997   CHF (congestive heart failure) (HCC)    COPD (chronic obstructive pulmonary disease) (HCC)    Coronary artery disease    CVA (cerebral vascular accident) (HCC)    Diabetes mellitus without complication (HCC)    Dysrhythmia    GERD (gastroesophageal reflux disease)    HLD (hyperlipidemia)    Hypertension    S/P TAVR (transcatheter aortic valve replacement) 07/05/2021   with Edwards 26mm S3UR via TF approach with Dr. Excell Seltzer and Dr. Laneta Simmers   Seizures [facility])      Past Surgical History:  Procedure Laterality Date   BYPASS GRAFT ANGIOGRAPHY N/A 04/25/2021   Procedure: BYPASS GRAFT ANGIOGRAPHY;  Surgeon: Armando Reichert, MD;  Location: [facility] INVASIVE CV LAB;  Service: Cardiovascular;  Laterality: N/A;   CARDIAC SURGERY     CORNEAL TRANSPLANT Right    CORONARY ARTERY BYPASS GRAFT  1997   ELECTROPHYSIOLOGY STUDY N/A 09/08/2021   Procedure: ELECTROPHYSIOLOGY STUDY;  Surgeon: Lanier Prude, MD;  Location: [facility] INVASIVE CV LAB;  Service: Cardiovascular;  Laterality: N/A;   EYE SURGERY     FLEXOR TENDON REPAIR Left 07/11/2019   Procedure: FLEXOR tenolysis  REPAIR LEFT RING FINGER with tednon repair;  Surgeon: Kennedy Bucker, MD;  Location: [facility] ORS;  Service: Orthopedics;  Laterality: Left;   INCISION AND DRAINAGE ABSCESS Left 05/08/2019   Procedure: INCISION AND DRAINAGE ABSCESS;  Surgeon: Donato Heinz, MD;  Location: [facility] ORS;  Service: Orthopedics;  Laterality: Left;   INTRAOPERATIVE TRANSTHORACIC  ECHOCARDIOGRAM N/A 07/05/2021   Procedure: INTRAOPERATIVE TRANSTHORACIC ECHOCARDIOGRAM;  Surgeon: Tonny Bollman, MD;  Location: [facility] OR;  Service: Open Heart Surgery;  Laterality: N/A;   LEFT HEART CATH AND CORONARY ANGIOGRAPHY Left 06/09/2022   Procedure: LEFT HEART CATH AND CORONARY ANGIOGRAPHY;  Surgeon: Laurier Nancy, MD;  Location: [facility] INVASIVE CV LAB;  Service: Cardiovascular;  Laterality: Left;   LEFT HEART CATH AND CORS/GRAFTS ANGIOGRAPHY N/A 10/02/2019   Procedure: LEFT HEART CATH AND CORS/GRAFTS ANGIOGRAPHY;  Surgeon: Marykay Lex, MD;  Location: [facility] INVASIVE CV LAB;  Service: Cardiovascular;  Laterality: N/A;   LEFT HEART CATH AND CORS/GRAFTS ANGIOGRAPHY N/A 07/17/2022   Procedure: LEFT HEART CATH AND CORS/GRAFTS ANGIOGRAPHY and possible PCI and stent;  Surgeon: Laurier Nancy, MD;  Location: [facility] INVASIVE CV LAB;  Service: Cardiovascular;  Laterality: N/A;   LOOP RECORDER INSERTION N/A 10/06/2019   Procedure: LOOP RECORDER INSERTION;  Surgeon: Marinus Maw, MD;  Location: [facility] INVASIVE CV LAB;  Service: Cardiovascular;  Laterality: N/A;   TRANSCATHETER AORTIC VALVE REPLACEMENT, TRANSFEMORAL N/A 07/05/2021   Procedure: TRANSCATHETER AORTIC VALVE REPLACEMENT, TRANSFEMORAL;  Surgeon: Tonny Bollman, MD;  Location[facility];  Service: Open Heart Surgery;  Laterality: N/A;   TRIGGER FINGER RELEASE Left      Current Outpatient Medications  Medication Sig Dispense Refill   isosorbide mononitrate (IMDUR) 30 MG 24 hr tablet Take 1/2 tab  dialy 30 tablet 1   acetaminophen (TYLENOL) 650 MG CR tablet Take 1,300 mg by mouth every 8 (eight) hours as needed for pain.     amiodarone (PACERONE) 200 MG tablet Take 1 tablet (200 mg total) by mouth daily. 30 tablet 3   apixaban (ELIQUIS) 5 MG TABS tablet Take 1 tablet (5 mg total) by mouth 2 (two) times daily. 180 tablet 1   atorvastatin (LIPITOR) 80 MG tablet Take 1 tablet (80 mg total) by mouth at bedtime. 30 tablet 1   Calcium Citrate-Vitamin D  (CALCIUM + D PO) Take 1 tablet by mouth 2 (two) times daily.     clopidogrel (PLAVIX) 75 MG tablet Take 1 tablet (75 mg total) by mouth daily. 30 tablet 11   dapagliflozin propanediol (FARXIGA) 10 MG TABS tablet Take 1 tablet (10 mg total) by mouth daily before breakfast. 30 tablet 5   divalproex (DEPAKOTE ER) 250 MG 24 hr tablet Take 1 tablet (250 mg total) by mouth at bedtime. 30 tablet 0   Dulaglutide 3 MG/0.5ML SOPN Inject 3 mg into the skin once a week.     furosemide (LASIX) 20 MG tablet Take 1 tablet (20 mg total) by mouth daily. 30 tablet 1   metoprolol succinate (TOPROL-XL) 25 MG 24 hr tablet Take 0.5 tablets (12.5 mg total) by mouth daily. 30 tablet 0   Multiple Vitamin (MULTIVITAMIN) capsule Take 1 capsule by mouth daily.     nitroGLYCERIN (NITROSTAT) 0.4 MG SL tablet Place 0.4 mg under the tongue every 5 (five) minutes as needed for chest pain.     pantoprazole (PROTONIX) 40 MG tablet Take 40 mg by mouth daily.     No current facility-administered medications for this visit.    Allergies:   Patient has no known allergies.    Social History:   reports that he quit smoking about 40 years ago. His smoking use included cigarettes. He has never used smokeless tobacco. He reports that he does not currently use alcohol. He reports that he does not use drugs.   Family History:  family history includes Seizures in his brother.    ROS:     Review of Systems  Constitutional: Negative.   HENT: Negative.    Eyes: Negative.   Respiratory: Negative.    Gastrointestinal: Negative.   Genitourinary: Negative.   Musculoskeletal: Negative.   Skin: Negative.   Neurological: Negative.   Endo/Heme/Allergies: Negative.   Psychiatric/Behavioral: Negative.    All other systems reviewed and are negative.     All other systems are reviewed and negative.    PHYSICAL EXAM: VS:  BP 122/62   Pulse 73   Ht 5\' 3"  (1.6 m)   Wt 136 lb 3.2 oz (61.8 kg)   SpO2 97%   BMI 24.13 kg/m  , BMI Body  mass index is 24.13 kg/m. Last weight:  Wt Readings from Last 3 Encounters:  01/25/23 136 lb 3.2 oz (61.8 kg)  01/15/23 138 lb (62.6 kg)  11/27/22 135 lb (61.2 kg)     Physical Exam Vitals reviewed.  Constitutional:      Appearance: Normal appearance. He is normal weight.  HENT:     Head: Normocephalic.     Nose: Nose normal.     Mouth/Throat:     Mouth: Mucous membranes are moist.  Eyes:     Pupils: Pupils are equal, round, and reactive to light.  Cardiovascular:     Rate and Rhythm: Normal rate and regular rhythm.  Pulses: Normal pulses.     Heart sounds: Normal heart sounds.  Pulmonary:     Effort: Pulmonary effort is normal.  Abdominal:     General: Abdomen is flat. Bowel sounds are normal.  Musculoskeletal:        General: Normal range of motion.     Cervical back: Normal range of motion.  Skin:    General: Skin is warm.  Neurological:     General: No focal deficit present.     Mental Status: He is alert.  Psychiatric:        Mood and Affect: Mood normal.       EKG:   Recent Labs: 07/16/2022: Magnesium 2.0 10/02/2022: B Natriuretic Peptide 531.2 01/15/2023: ALT 14; BUN 30; Creatinine, Ser 1.55; Hemoglobin 9.9; Platelets 198; Potassium 4.4; Sodium 140    Lipid Panel    Component Value Date/Time   CHOL 141 05/27/2022 0520   CHOL 143 02/25/2020 1126   TRIG 159 (H) 05/27/2022 0520   HDL 32 (L) 05/27/2022 0520   HDL 31 (L) 02/25/2020 1126   CHOLHDL 4.4 05/27/2022 0520   VLDL 32 05/27/2022 0520   LDLCALC 77 05/27/2022 0520   LDLCALC 78 02/25/2020 1126      Other studies Reviewed: Additional studies/ records that were reviewed today include:  Review of the above records demonstrates:       No data to display            ASSESSMENT AND PLAN:    ICD-10-CM   1. Orthostatic hypotension  I95.1    Take isosrbide 1/2 tab daily as has dizziness    2. Bilateral carotid artery stenosis  I65.23     3. Chronic combined systolic and diastolic  congestive heart failure (HCC)  I50.42     4. Coronary artery disease with hx of myocardial infarct w/o hx of CABG  I25.10    I25.2     5. Essential hypertension  I10     6. Non-ST elevation (NSTEMI) myocardial infarction (HCC)  I21.4     7. Paroxysmal atrial fibrillation (HCC)  I48.0     8. Severe aortic stenosis  I35.0        Problem List Items Addressed This Visit       Cardiovascular and Mediastinum   Essential hypertension   Relevant Medications   isosorbide mononitrate (IMDUR) 30 MG 24 hr tablet   Bilateral carotid artery disease (HCC)   Relevant Medications   isosorbide mononitrate (IMDUR) 30 MG 24 hr tablet   Non-ST elevation (NSTEMI) myocardial infarction (HCC)   Relevant Medications   isosorbide mononitrate (IMDUR) 30 MG 24 hr tablet   Coronary artery disease with hx of myocardial infarct w/o hx of CABG   Relevant Medications   isosorbide mononitrate (IMDUR) 30 MG 24 hr tablet   Severe aortic stenosis   Relevant Medications   isosorbide mononitrate (IMDUR) 30 MG 24 hr tablet   Paroxysmal atrial fibrillation (HCC)   Relevant Medications   isosorbide mononitrate (IMDUR) 30 MG 24 hr tablet   Chronic combined systolic and diastolic congestive heart failure (HCC)   Relevant Medications   isosorbide mononitrate (IMDUR) 30 MG 24 hr tablet   Orthostatic hypotension - Primary   Relevant Medications   isosorbide mononitrate (IMDUR) 30 MG 24 hr tablet       Disposition:   Return in about 4 weeks (around 02/22/2023).    Total time spent: 30 minutes  Signed,  Adrian Blackwater, MD  01/25/2023 9:47 AM  [facility]

## 2023-02-12 ENCOUNTER — Other Ambulatory Visit: Payer: Self-pay

## 2023-02-12 ENCOUNTER — Encounter: Payer: Self-pay | Admitting: Emergency Medicine

## 2023-02-12 ENCOUNTER — Emergency Department: Payer: Medicare HMO

## 2023-02-12 ENCOUNTER — Ambulatory Visit (INDEPENDENT_AMBULATORY_CARE_PROVIDER_SITE_OTHER): Payer: Medicare HMO

## 2023-02-12 ENCOUNTER — Emergency Department
Admission: EM | Admit: 2023-02-12 | Discharge: 2023-02-12 | Disposition: A | Discharge status: Home / Self Care | Payer: Medicare HMO | Attending: Student in an Organized Health Care Education/Training Program | Admitting: Student in an Organized Health Care Education/Training Program

## 2023-02-12 DIAGNOSIS — Z87442 Personal history of urinary calculi: Secondary | ICD-10-CM | POA: Diagnosis not present

## 2023-02-12 DIAGNOSIS — J90 Pleural effusion, not elsewhere classified: Secondary | ICD-10-CM | POA: Diagnosis not present

## 2023-02-12 DIAGNOSIS — R55 Syncope and collapse: Secondary | ICD-10-CM | POA: Diagnosis not present

## 2023-02-12 DIAGNOSIS — N400 Enlarged prostate without lower urinary tract symptoms: Secondary | ICD-10-CM | POA: Diagnosis not present

## 2023-02-12 DIAGNOSIS — G8929 Other chronic pain: Secondary | ICD-10-CM | POA: Diagnosis not present

## 2023-02-12 DIAGNOSIS — M545 Low back pain, unspecified: Secondary | ICD-10-CM | POA: Insufficient documentation

## 2023-02-12 DIAGNOSIS — N200 Calculus of kidney: Secondary | ICD-10-CM | POA: Diagnosis not present

## 2023-02-12 DIAGNOSIS — N2889 Other specified disorders of kidney and ureter: Secondary | ICD-10-CM | POA: Diagnosis not present

## 2023-02-12 DIAGNOSIS — M5459 Other low back pain: Secondary | ICD-10-CM | POA: Diagnosis not present

## 2023-02-12 LAB — CUP PACEART REMOTE DEVICE CHECK
Date Time Interrogation Session: 20240907230547
Implantable Pulse Generator Implant Date: 20210503

## 2023-02-12 LAB — URINALYSIS, ROUTINE W REFLEX MICROSCOPIC
Bacteria, UA: NONE SEEN
Bilirubin Urine: NEGATIVE
Glucose, UA: >=500 mg/dL — AB
Ketones, ur: NEGATIVE mg/dL
Leukocytes,Ua: NEGATIVE
Nitrite: NEGATIVE
Protein, ur: NEGATIVE mg/dL
Specific Gravity, Urine: 1.005 (ref 1.005–1.030)
Squamous Epithelial / HPF: 0 /HPF (ref 0–5)
pH: 7 (ref 5.0–8.0)

## 2023-02-12 MED ORDER — PREDNISONE 20 MG PO TABS: 40.0000 mg | ORAL_TABLET | Freq: Every day | ORAL | 0 refills | Status: AC

## 2023-02-12 NOTE — ED Notes (Signed)
Patient transported to CT 

## 2023-02-12 NOTE — ED Provider Notes (Signed)
[facility] Provider Note    Event Date/Time   First MD Initiated Contact with Patient 02/12/23 1103     (approximate)   History   Back Pain   HPI  Thomas Moore is a 67 y.o. male with extensive past medical history including kidney stones as well as lumbar degenerative disc disease presents to the ER for evaluation of worsening back pain flank pain getting worse for the past 2448 hrs.  Has had a fall over the past few weeks.  Denies any numbness or tingling no fevers no dysuria no hematuria.  Took some Tylenol this morning without much improvement.     Physical Exam   Triage Vital Signs: ED Triage Vitals  Encounter Vitals Group     BP 02/12/23 1059 (!) 156/75     Systolic BP Percentile --      Diastolic BP Percentile --      Pulse Rate 02/12/23 1059 82     Resp 02/12/23 1059 18     Temp 02/12/23 1059 98.5 F (36.9 C)     Temp src --      SpO2 02/12/23 1059 100 %     Weight 02/12/23 1056 135 lb (61.2 kg)     Height 02/12/23 1056 5\' 3"  (1.6 m)     Head Circumference --      Peak Flow --      Pain Score 02/12/23 1055 5     Pain Loc --      Pain Education --      Exclude from Growth Chart --     Most recent vital signs: Vitals:   02/12/23 1059  BP: (!) 156/75  Pulse: 82  Resp: 18  Temp: 98.5 F (36.9 C)  SpO2: 100%     Constitutional: Alert  Eyes: Conjunctivae are normal.  Head: Atraumatic. Nose: No congestion/rhinnorhea. Mouth/Throat: Mucous membranes are moist.   Neck: Painless ROM.  Cardiovascular:   Good peripheral circulation. Respiratory: Normal respiratory effort.  No retractions.  Gastrointestinal: Soft and nontender.  Musculoskeletal:  no deformity Neurologic:  MAE spontaneously. No gross focal neurologic deficits are appreciated.  Skin:  Skin is warm, dry and intact. No rash noted. Psychiatric: Mood and affect are normal. Speech and behavior are normal.    ED Results / Procedures / Treatments   Labs (all labs  ordered are listed, but only abnormal results are displayed) Labs Reviewed  URINALYSIS, ROUTINE W REFLEX MICROSCOPIC - Abnormal; Notable for the following components:      Result Value   Color, Urine COLORLESS (*)    APPearance CLEAR (*)    Glucose, UA >=500 (*)    Hgb urine dipstick MODERATE (*)    All other components within normal limits     EKG     RADIOLOGY Please see ED Course for my review and interpretation.  I personally reviewed all radiographic images ordered to evaluate for the above acute complaints and reviewed radiology reports and findings.  These findings were personally discussed with the patient.  Please see medical record for radiology report.    PROCEDURES:  Critical Care performed:   Procedures   MEDICATIONS ORDERED IN ED: Medications - No data to display   IMPRESSION / MDM / ASSESSMENT AND PLAN / ED COURSE  I reviewed the triage vital signs and the nursing notes.  Differential diagnosis includes, but is not limited to, stone, pyelonephritis, UTI, degenerative disc disease, spinal stenosis, doubt cauda equina.  Lumbago, fracture  Patient presenting to the ER for evaluation of symptoms as described above.  Based on symptoms, risk factors and considered above differential, this presenting complaint could reflect a potentially life-threatening illness therefore the patient will be placed on continuous pulse oximetry and telemetry for monitoring.  Patient clinically nontoxic-appearing.  Not consistent with cauda equina.  Given history and presenting symptoms CT imaging as well as urinalysis will be ordered for above differential.  On review of previous CT imaging in 2023 did not have a AAA.   Clinical Course as of 02/12/23 1343  Mon Feb 12, 2023  1239 CT imaging my review and interpretation with evidence of significant atherosclerotic disease.  Will await formal radiology report. [PR]  1342 CT imaging with extensive  degenerative disc disease likely causing patient's symptoms.  Does not have any focal neurodeficits currently.  He is ambulating with no acute distress.  Will place on low-dose steroid for few days to see if this helps.  Do think he would benefit from outpatient referral for follow-up given the extent of his disease.  Incidental findings of effusion and opacities in chest noted.  He denies any cough shortness of breath or chest discomfort.  Likely chronic findings in the setting of his known CHF COPD.  Does not have any wheezing on exam.  Does appear appropriate for outpatient follow-up do not feel that further diagnostic testing clinically indicated based on patient's presentation. [PR]    Clinical Course User Index [PR] Willy Eddy, MD     FINAL CLINICAL IMPRESSION(S) / ED DIAGNOSES   Final diagnoses:  Chronic midline low back pain without sciatica     Rx / DC Orders   ED Discharge Orders          Ordered    predniSONE (DELTASONE) 20 MG tablet  Daily        02/12/23 1342             Note:  This document was prepared using Dragon voice recognition software and may include unintentional dictation errors.    Willy Eddy, MD 02/12/23 1343

## 2023-02-12 NOTE — ED Triage Notes (Signed)
Patient to ED via POV for lower back pain. States he has some disc that "are real narrow." Pain started yesetrday and getting worse.

## 2023-02-12 NOTE — Group Note (Deleted)

## 2023-02-13 ENCOUNTER — Other Ambulatory Visit: Payer: Self-pay | Admitting: Family Medicine

## 2023-02-13 DIAGNOSIS — M545 Low back pain, unspecified: Secondary | ICD-10-CM

## 2023-02-14 NOTE — Progress Notes (Signed)
Referring Physician:  Lauro Regulus, MD [address] [facility] [place],  [place] [address]  Primary Physician:  Lauro Regulus, MD  History of Present Illness: 02/14/2023 Mr. Thomas Moore is here today with a chief complaint of back pain.  He is followed up in the emergency department on 9 02/2023.  He states that he had significant back pain which took him into the emergency department.  He has family history of significant spinal surgery so he wanted to be evaluated.  States that his pain was bad in his back and radiated to his right buttocks.  Also radiate down to the right thigh to his knee.  He states that this is been going on for many years, however continues to significantly flare over the past 2 weeks.  He is not having any bowel or bladder dysfunction.  I have utilized the care everywhere function in epic to review the outside records available from external health systems.  Review of Systems:  A 10 point review of systems is negative, except for the pertinent positives and negatives detailed in the HPI.  Past Medical History: Past Medical History:  Diagnosis Date   CAD (coronary artery disease) of artery bypass graft    s/p CABG x 4 in 1997   CHF (congestive heart failure) (HCC)    COPD (chronic obstructive pulmonary disease) (HCC)    Coronary artery disease    CVA (cerebral vascular accident) (HCC)    Diabetes mellitus without complication (HCC)    Dysrhythmia    GERD (gastroesophageal reflux disease)    HLD (hyperlipidemia)    Hypertension    S/P TAVR (transcatheter aortic valve replacement) 07/05/2021   with Edwards 26mm S3UR via TF approach with Dr. Excell Seltzer and Dr. Laneta Simmers   Seizures [facility])     Past Surgical History: Past Surgical History:  Procedure Laterality Date   BYPASS GRAFT ANGIOGRAPHY N/A 04/25/2021   Procedure: BYPASS GRAFT ANGIOGRAPHY;  Surgeon: Armando Reichert, MD;  Location: [facility] INVASIVE CV LAB;  Service:  Cardiovascular;  Laterality: N/A;   CARDIAC SURGERY     CORNEAL TRANSPLANT Right    CORONARY ARTERY BYPASS GRAFT  1997   ELECTROPHYSIOLOGY STUDY N/A 09/08/2021   Procedure: ELECTROPHYSIOLOGY STUDY;  Surgeon: Lanier Prude, MD;  [facility] INVASIVE CV LAB;  Service: Cardiovascular;  Laterality: N/A;   EYE SURGERY     FLEXOR TENDON REPAIR Left 07/11/2019   Procedure: FLEXOR tenolysis  REPAIR LEFT RING FINGER with tednon repair;  Surgeon: Kennedy Bucker, MD;  Location: [facility] ORS;  Service: Orthopedics;  Laterality: Left;   INCISION AND DRAINAGE ABSCESS Left 05/08/2019   Procedure: INCISION AND DRAINAGE ABSCESS;  Surgeon: Donato Heinz, MD;  Location: [facility] ORS;  Service: Orthopedics;  Laterality: Left;   INTRAOPERATIVE TRANSTHORACIC ECHOCARDIOGRAM N/A 07/05/2021   Procedure: INTRAOPERATIVE TRANSTHORACIC ECHOCARDIOGRAM;  Surgeon: Tonny Bollman, MD;  Location[facility] OR;  Service: Open Heart Surgery;  Laterality: N/A;   LEFT HEART CATH AND CORONARY ANGIOGRAPHY Left 06/09/2022   Procedure: LEFT HEART CATH AND CORONARY ANGIOGRAPHY;  Surgeon: Laurier Nancy, MD;  Location: [facility] INVASIVE CV LAB;  Service: Cardiovascular;  Laterality: Left;   LEFT HEART CATH AND CORS/GRAFTS ANGIOGRAPHY N/A 10/02/2019   Procedure: LEFT HEART CATH AND CORS/GRAFTS ANGIOGRAPHY;  Surgeon: Marykay Lex, MD;  Location: [facility] INVASIVE CV LAB;  Service: Cardiovascular;  Laterality: N/A;   LEFT HEART CATH AND CORS/GRAFTS ANGIOGRAPHY N/A 07/17/2022   Procedure: LEFT HEART CATH AND CORS/GRAFTS  ANGIOGRAPHY and possible PCI and stent;  Surgeon: Laurier Nancy, MD;  Location: [facility] INVASIVE CV LAB;  Service: Cardiovascular;  Laterality: N/A;   LOOP RECORDER INSERTION N/A 10/06/2019   Procedure: LOOP RECORDER INSERTION;  Surgeon: Marinus Maw, MD;  Location: [facility] INVASIVE CV LAB;  Service: Cardiovascular;  Laterality: N/A;   TRANSCATHETER AORTIC VALVE REPLACEMENT, TRANSFEMORAL N/A 07/05/2021   Procedure: TRANSCATHETER AORTIC VALVE  REPLACEMENT, TRANSFEMORAL;  Surgeon: Tonny Bollman, MD;  [facility]: [facility] OR;  Service: Open Heart Surgery;  Laterality: N/A;   TRIGGER FINGER RELEASE Left     Allergies: Allergies as of 02/19/2023   (No Known Allergies)    Medications:  Current Outpatient Medications:    acetaminophen (TYLENOL) 650 MG CR tablet, Take 1,300 mg by mouth every 8 (eight) hours as needed for pain., Disp: , Rfl:    amiodarone (PACERONE) 200 MG tablet, Take 1 tablet (200 mg total) by mouth daily., Disp: 30 tablet, Rfl: 3   apixaban (ELIQUIS) 5 MG TABS tablet, Take 1 tablet (5 mg total) by mouth 2 (two) times daily., Disp: 180 tablet, Rfl: 1   atorvastatin (LIPITOR) 80 MG tablet, Take 1 tablet (80 mg total) by mouth at bedtime., Disp: 30 tablet, Rfl: 1   Calcium Citrate-Vitamin D (CALCIUM + D PO), Take 1 tablet by mouth 2 (two) times daily., Disp: , Rfl:    clopidogrel (PLAVIX) 75 MG tablet, Take 1 tablet (75 mg total) by mouth daily., Disp: 30 tablet, Rfl: 11   dapagliflozin propanediol (FARXIGA) 10 MG TABS tablet, Take 1 tablet (10 mg total) by mouth daily before breakfast., Disp: 30 tablet, Rfl: 5   divalproex (DEPAKOTE ER) 250 MG 24 hr tablet, Take 1 tablet (250 mg total) by mouth at bedtime., Disp: 30 tablet, Rfl: 0   Dulaglutide 3 MG/0.5ML SOPN, Inject 3 mg into the skin once a week., Disp: , Rfl:    furosemide (LASIX) 20 MG tablet, Take 1 tablet (20 mg total) by mouth daily., Disp: 30 tablet, Rfl: 1   isosorbide mononitrate (IMDUR) 30 MG 24 hr tablet, Take 1/2 tab dialy, Disp: 30 tablet, Rfl: 1   metoprolol succinate (TOPROL-XL) 25 MG 24 hr tablet, Take 0.5 tablets (12.5 mg total) by mouth daily., Disp: 30 tablet, Rfl: 0   Multiple Vitamin (MULTIVITAMIN) capsule, Take 1 capsule by mouth daily., Disp: , Rfl:    nitroGLYCERIN (NITROSTAT) 0.4 MG SL tablet, Place 0.4 mg under the tongue every 5 (five) minutes as needed for chest pain., Disp: , Rfl:    pantoprazole (PROTONIX) 40 MG tablet, Take 40 mg by mouth  daily., Disp: , Rfl:    predniSONE (DELTASONE) 20 MG tablet, Take 2 tablets (40 mg total) by mouth daily for 5 days., Disp: 10 tablet, Rfl: 0  Social History: Social History   Tobacco Use   Smoking status: Former    Current packs/day: 0.00    Types: Cigarettes    Quit date: 1984    Years since quitting: 40.7   Smokeless tobacco: Never   Tobacco comments:    Quit 40 years ago  Vaping Use   Vaping status: Never Used  Substance Use Topics   Alcohol use: Not Currently   Drug use: Never    Family Medical History: Family History  Problem Relation Age of Onset   Seizures Brother     Physical Examination: There were no vitals filed for this visit.  General: Patient is in no apparent distress. Attention to examination is appropriate.  Neck:  Supple.  Full range of motion.  Respiratory: Patient is breathing without any difficulty.   NEUROLOGICAL:     Awake, alert, oriented to person, place, and time.  Speech is clear and fluent.   Cranial Nerves: Pupils equal round and reactive to light.  Facial tone is symmetric.  Facial sensation is symmetric. Shoulder shrug is symmetric. Tongue protrusion is midline.    Strength:  Side Iliopsoas Quads Hamstring PF DF EHL  R 5 5 5 5 5 5   L 5 5 5 5 5 5    No major sensory loss, reflexes are hyporeflexic throughout.     No evidence of dysmetria noted.  Gait is normal.    SLR Positive   Imaging: arrative & Impression  CLINICAL DATA:  Low back pain and flank pain that began yesterday and is getting worse.   EXAM: CT ABDOMEN AND PELVIS WITHOUT CONTRAST   TECHNIQUE: Multidetector CT imaging of the abdomen and pelvis was performed following the standard protocol without IV contrast.   RADIATION DOSE REDUCTION: This exam was performed according to the departmental dose-optimization program which includes automated exposure control, adjustment of the mA and/or kV according to patient size and/or use of iterative reconstruction  technique.   COMPARISON:  CTA January 2023 abdominal ultrasound 01/15/2023.   FINDINGS: Lower chest: Increasing small bilateral pleural effusion with the adjacent opacities. Atelectasis versus infiltrate. Coronary artery calcifications are seen. Breathing motion.   Hepatobiliary: On this non IV contrast exam, there is grossly preserved hepatic parenchyma. Intrahepatic vascular calcifications are seen. Stable tiny cystic focus medially in segment 5. The gallbladder is nondilated.   Pancreas: Unremarkable. No pancreatic ductal dilatation or surrounding inflammatory changes.   Spleen: Normal in size without focal abnormality.   Adrenals/Urinary Tract: Adrenal glands are preserved. Vascular calcifications extending along both kidneys. Nonobstructing left-sided renal stone measuring 6 mm. No ureteral stones. No collecting system dilatation. Preserved contours of the urinary bladder.   Stomach/Bowel: On this non oral contrast exam, the stomach is preserved. Stomach is underdistended. Small bowel is nondilated. Large bowel is of normal course and caliber with moderate stool. Normal appendix in the right hemipelvis.   Vascular/Lymphatic: Extensive vascular calcifications. Normal caliber aorta and IVC. No specific abnormal lymph node enlargement identified.   Reproductive: Enlarged prostate with mass effect along the base of the bladder. Please correlate with patient's PSA.   Other: Anasarca. No free air or free fluid. Motion throughout the examination.   Musculoskeletal: Moderate degenerative changes of the spine greater than pelvis. Osteopenia. Again there is Schmorl's node changes of the spine at multiple levels particularly at L5, L1 and T12. There is severe disc height loss in particular at L3-4 with posterior osteophytes contributing to canal stenosis. Please correlate with symptoms.   IMPRESSION: Nonobstructing left-sided renal stone.  No ureteral stones.   Moderate  colonic stool.  No bowel obstruction.  Normal appendix.   Enlarged prostate.  Please correlate with patient's PSA.   Increasing small bilateral pleural effusions and adjacent lung opacities. Atelectasis versus infiltrate. Recommend follow-up.   Multifocal degenerative changes along the spine and pelvis with areas of stenosis along the lumbar spine. Please correlate with particular symptomatology.     Electronically Signed   By: Karen Kays M.D.   On: 02/12/2023 13:30      I have personally reviewed the images and agree with the above interpretation.  He does have some osteophytic changes which what looks like a calcified lumbar disc likely causing significant stenosis, however given the  limitations of the study unable to clearly evaluate the canal  Medical Decision Making/Assessment and Plan: Mr. Sutherby is a pleasant 67 y.o. male with chronic history of back pain and family history of spondylosis and multiple spinal surgeries.  He has had back pain for many years, however has had a worsened course over the past few months and severely worse over the past 2 weeks.  He was seen in the emergency department.  CT scan demonstrated multilevel spondylosis with a disc osteophyte complex or calcified disc causing likely significant stenosis.  Given his worsening radicular symptoms in the right lower extremity specifically we will plan on getting an MRI to evaluate for the level of stenosis as it does look quite significant on CT scan.  Will also have him set up for physical therapy.  Should the physical therapy not be helpful in his recovery, could consider epidural spinal injections.  Will follow-up with him after his imaging.  Lovenia Kim MD/MSCR Neurosurgery

## 2023-02-15 ENCOUNTER — Other Ambulatory Visit: Payer: Self-pay

## 2023-02-15 MED ORDER — NITROGLYCERIN 0.4 MG SL SUBL: 0.4000 mg | SUBLINGUAL_TABLET | SUBLINGUAL | 0 refills | Status: DC | PRN

## 2023-02-19 ENCOUNTER — Ambulatory Visit
Admission: RE | Admit: 2023-02-19 | Discharge: 2023-02-19 | Disposition: A | Discharge status: Home / Self Care | Payer: Medicare HMO | Source: Ambulatory Visit | Attending: Neurosurgery | Admitting: Neurosurgery

## 2023-02-19 ENCOUNTER — Encounter: Payer: Self-pay | Admitting: Neurosurgery

## 2023-02-19 ENCOUNTER — Ambulatory Visit: Payer: Medicare HMO | Admitting: Neurosurgery

## 2023-02-19 VITALS — BP 126/72 | Ht 63.0 in | Wt 136.2 lb

## 2023-02-19 DIAGNOSIS — M47816 Spondylosis without myelopathy or radiculopathy, lumbar region: Secondary | ICD-10-CM | POA: Diagnosis not present

## 2023-02-19 DIAGNOSIS — M545 Low back pain, unspecified: Secondary | ICD-10-CM | POA: Diagnosis not present

## 2023-02-19 DIAGNOSIS — G8929 Other chronic pain: Secondary | ICD-10-CM

## 2023-02-19 DIAGNOSIS — M48062 Spinal stenosis, lumbar region with neurogenic claudication: Secondary | ICD-10-CM | POA: Diagnosis not present

## 2023-02-19 DIAGNOSIS — M4716 Other spondylosis with myelopathy, lumbar region: Secondary | ICD-10-CM | POA: Insufficient documentation

## 2023-02-19 NOTE — Patient Instructions (Signed)
LOCAL PHYSICAL THERAPY  [facility]  [address]  [place], [place] [address]  [phone]  [facility]  [address] [place], [place] [address]  [phone]  [facility] (2 locations)  [address]  [place], [place] [address]  [phone]          or  [address]  [place], [place] [address]  [phone]  [facility]  [address]  [place], [place] [address]  [phone]  **dry needling**  [facility])  [address]  [place], [place] [address]  [phone]  Fax: [phone]  ** Aquatic therapy[address] [place], [place] [address] [phone] **Aquatic therapy**  [facility]  [facility]  [address]  [place], [place] [address]  [phone]  [facility]  [address]  [place], [place] [address]  [phone]  [facility]  [address]   Janora Norlander  [place], [place] [address]  [phone]  [facility]  [address]  [place], [place] [address]  [phone]  **dry needling**   PELVIC FLOOR/SI JOINT  [facility]  Mariane Masters, PT  [email or web address]   [place]  [facility]  [address]  [address] [place], [place] [address]  [phone]   [facility] [place]  [address], [place] [address]  [phone]   [facility], [place]  [facility]  Raymond Gurney, PT  [address]  [place], [place] [address] [phone]   Samara Deist  [facility]  [address]  [phone]   [facility]  [address]  [phone]   [facility]  Annamaria Boots [facility]  [address]  [place], [place] [address]  [phone]   **No Aetna or medicaid**  [facility]  [phone] [address], [place] [address]  [phone]  [facility]  [address]  [place], [place] [address]  [phone]  Dannial Monarch, DC  [address], [place] [address]  [phone]  [facility]  [address], [place] [address]  [phone]  [facility]  [address]  [place], [place] [address]  [phone]  [facility]  (3 locations)  [address]  [place], [place] [address]  [phone]  **dry needling**           or  [address] [place], [place] [address]  [phone]  **Additionally has Gloris Manchester, OT**           or  [address]  [place], [place] [address]  [phone]  **Pediatric therapy**  [facility]  [address]  [phone]  **dry needlingVerdie Drown [facility]  [address]  [place], [place] [address]  [phone]  [facility]   (Inside [address] [facility]itness)  [address]  [place], [place] [address]  [phone]  **dry needling**  **MEDICAID or UNINSURED** [facility] dept. Of Physical Therapy [place], [place] [address] [phone]  [facility]  [address] [place], [place] [address]  [phone]   [facility]  [address]  [place], [place] [address]  [phone]   Doreatha Martin  [facility]  [address] [place], [place] [address]  [phone]   [facility]  [address]  [place], [place] [address]  [phone]   [facility]  [address] [place], [place] [address]  [phone]  [facility]  [address]  [place], [place] [address]  [phone]  AQUATIC  Kathalene Frames [facility]  [employer]  [facility]  [place]  *Residents only*   [facility] [employer]  *[facility]  Exercise class  [facility]  Exercise class  [facility]  [address]  [place], [place]   [phone]  [facility]  [address]  [place], [place] [address]  [phone]   [place], [place]  [facility]  [address]  [phone]   [facility]  [facility]  [address]  [phone]           or  [address]  [phone]   [facility] Support [facility]   Provides education and support and practical information for coping with arthritis for arthritis sufferers and their families.   When: 12:15 - 1:30 p.m. the second Monday of each month, March through December  Info: Call Rehabilitation Services at [phone]

## 2023-02-21 DIAGNOSIS — E1122 Type 2 diabetes mellitus with diabetic chronic kidney disease: Secondary | ICD-10-CM | POA: Diagnosis not present

## 2023-02-21 DIAGNOSIS — I4891 Unspecified atrial fibrillation: Secondary | ICD-10-CM | POA: Diagnosis not present

## 2023-02-21 DIAGNOSIS — Z23 Encounter for immunization: Secondary | ICD-10-CM | POA: Diagnosis not present

## 2023-02-21 DIAGNOSIS — I2510 Atherosclerotic heart disease of native coronary artery without angina pectoris: Secondary | ICD-10-CM | POA: Diagnosis not present

## 2023-02-21 DIAGNOSIS — N183 Chronic kidney disease, stage 3 unspecified: Secondary | ICD-10-CM | POA: Diagnosis not present

## 2023-02-22 NOTE — Progress Notes (Signed)
Carelink Summary Report / Loop Recorder 

## 2023-02-26 ENCOUNTER — Ambulatory Visit (INDEPENDENT_AMBULATORY_CARE_PROVIDER_SITE_OTHER): Payer: Medicare HMO | Admitting: Cardiovascular Disease

## 2023-02-26 ENCOUNTER — Ambulatory Visit: Payer: Medicare HMO | Admitting: Cardiovascular Disease

## 2023-02-26 ENCOUNTER — Encounter: Payer: Self-pay | Admitting: Cardiovascular Disease

## 2023-02-26 VITALS — BP 120/65 | HR 78 | Ht 63.0 in | Wt 137.0 lb

## 2023-02-26 DIAGNOSIS — I5042 Chronic combined systolic (congestive) and diastolic (congestive) heart failure: Secondary | ICD-10-CM

## 2023-02-26 DIAGNOSIS — I480 Paroxysmal atrial fibrillation: Secondary | ICD-10-CM | POA: Diagnosis not present

## 2023-02-26 DIAGNOSIS — I6523 Occlusion and stenosis of bilateral carotid arteries: Secondary | ICD-10-CM | POA: Diagnosis not present

## 2023-02-26 DIAGNOSIS — I2510 Atherosclerotic heart disease of native coronary artery without angina pectoris: Secondary | ICD-10-CM

## 2023-02-26 DIAGNOSIS — I252 Old myocardial infarction: Secondary | ICD-10-CM

## 2023-02-26 DIAGNOSIS — I10 Essential (primary) hypertension: Secondary | ICD-10-CM | POA: Diagnosis not present

## 2023-02-26 MED ORDER — FUROSEMIDE 20 MG PO TABS
20.0000 mg | ORAL_TABLET | Freq: Every day | ORAL | 1 refills | Status: DC
Start: 2023-02-26 — End: 2023-05-22

## 2023-02-26 NOTE — Progress Notes (Addendum)
Cardiology Office Note   Date:  02/26/2023   ID:  Thomas Moore, DOB 1955-07-03, MRN [id number]  PCP:  Lauro Regulus, MD  Cardiologist:  Adrian Blackwater, MD      History of Present Illness: Thomas Moore is a 67 y.o. male who presents for  Chief Complaint  Patient presents with   Follow-up    1 mo f/u    Has back pain      Past Medical History:  Diagnosis Date   CAD (coronary artery disease) of artery bypass graft    s/p CABG x 4 in 1997   CHF (congestive heart failure) (HCC)    COPD (chronic obstructive pulmonary disease) (HCC)    Coronary artery disease    CVA (cerebral vascular accident) (HCC)    Diabetes mellitus without complication (HCC)    Dysrhythmia    GERD (gastroesophageal reflux disease)    HLD (hyperlipidemia)    Hypertension    S/P TAVR (transcatheter aortic valve replacement) 07/05/2021   with Edwards 26mm S3UR via TF approach with Dr. Excell Seltzer and Dr. Laneta Simmers   Seizures [facility])      Past Surgical History:  Procedure Laterality Date   BYPASS GRAFT ANGIOGRAPHY N/A 04/25/2021   Procedure: BYPASS GRAFT ANGIOGRAPHY;  Surgeon: Armando Reichert, MD;  Location: [facility] INVASIVE CV LAB;  Service: Cardiovascular;  Laterality: N/A;   CARDIAC SURGERY     CORNEAL TRANSPLANT Right    CORONARY ARTERY BYPASS GRAFT  1997   ELECTROPHYSIOLOGY STUDY N/A 09/08/2021   Procedure: ELECTROPHYSIOLOGY STUDY;  Surgeon: Lanier Prude, MD;  Location: [facility] INVASIVE CV LAB;  Service: Cardiovascular;  Laterality: N/A;   EYE SURGERY     FLEXOR TENDON REPAIR Left 07/11/2019   Procedure: FLEXOR tenolysis  REPAIR LEFT RING FINGER with tednon repair;  Surgeon: Kennedy Bucker, MD;  Location: [facility] ORS;  Service: Orthopedics;  Laterality: Left;   INCISION AND DRAINAGE ABSCESS Left 05/08/2019   Procedure: INCISION AND DRAINAGE ABSCESS;  Surgeon: Donato Heinz, MD;  Location: [facility] ORS;  Service: Orthopedics;  Laterality: Left;   INTRAOPERATIVE TRANSTHORACIC ECHOCARDIOGRAM N/A  07/05/2021   Procedure: INTRAOPERATIVE TRANSTHORACIC ECHOCARDIOGRAM;  Surgeon: Tonny Bollman, MD;  Location: [facility] OR;  Service: Open Heart Surgery;  Laterality: N/A;   LEFT HEART CATH AND CORONARY ANGIOGRAPHY Left 06/09/2022   Procedure: LEFT HEART CATH AND CORONARY ANGIOGRAPHY;  Surgeon: Laurier Nancy, MD;  Location: [facility] INVASIVE CV LAB;  Service: Cardiovascular;  Laterality: Left;   LEFT HEART CATH AND CORS/GRAFTS ANGIOGRAPHY N/A 10/02/2019   Procedure: LEFT HEART CATH AND CORS/GRAFTS ANGIOGRAPHY;  Surgeon: Marykay Lex, MD;  Location: [facility] INVASIVE CV LAB;  Service: Cardiovascular;  Laterality: N/A;   LEFT HEART CATH AND CORS/GRAFTS ANGIOGRAPHY N/A 07/17/2022   Procedure: LEFT HEART CATH AND CORS/GRAFTS ANGIOGRAPHY and possible PCI and stent;  Surgeon: Laurier Nancy, MD;  Location: [facility] INVASIVE CV LAB;  Service: Cardiovascular;  Laterality: N/A;   LOOP RECORDER INSERTION N/A 10/06/2019   Procedure: LOOP RECORDER INSERTION;  Surgeon: Marinus Maw, MD;  Location: [facility] INVASIVE CV LAB;  Service: Cardiovascular;  Laterality: N/A;   TRANSCATHETER AORTIC VALVE REPLACEMENT, TRANSFEMORAL N/A 07/05/2021   Procedure: TRANSCATHETER AORTIC VALVE REPLACEMENT, TRANSFEMORAL;  Surgeon: Tonny Bollman, MD;  Location: [facility] OR;  Service: Open Heart Surgery;  Laterality: N/A;   TRIGGER FINGER RELEASE Left      Current Outpatient Medications  Medication Sig Dispense Refill   acetaminophen (TYLENOL) 650 MG CR tablet Take 1,300 mg by  mouth every 8 (eight) hours as needed for pain.     amiodarone (PACERONE) 200 MG tablet Take 1 tablet (200 mg total) by mouth daily. 30 tablet 3   apixaban (ELIQUIS) 5 MG TABS tablet Take 1 tablet (5 mg total) by mouth 2 (two) times daily. 180 tablet 1   atorvastatin (LIPITOR) 80 MG tablet Take 1 tablet (80 mg total) by mouth at bedtime. 30 tablet 1   Calcium Citrate-Vitamin D (CALCIUM + D PO) Take 1 tablet by mouth 2 (two) times daily.     clopidogrel (PLAVIX) 75 MG tablet Take 1  tablet (75 mg total) by mouth daily. 30 tablet 11   dapagliflozin propanediol (FARXIGA) 10 MG TABS tablet Take 1 tablet (10 mg total) by mouth daily before breakfast. 30 tablet 5   divalproex (DEPAKOTE ER) 250 MG 24 hr tablet Take 1 tablet (250 mg total) by mouth at bedtime. 30 tablet 0   Dulaglutide 3 MG/0.5ML SOPN Inject 3 mg into the skin once a week.     furosemide (LASIX) 20 MG tablet Take 1 tablet (20 mg total) by mouth daily. 30 tablet 1   isosorbide mononitrate (IMDUR) 30 MG 24 hr tablet Take 1/2 tab dialy 30 tablet 1   metoprolol succinate (TOPROL-XL) 25 MG 24 hr tablet Take 0.5 tablets (12.5 mg total) by mouth daily. 30 tablet 0   Multiple Vitamin (MULTIVITAMIN) capsule Take 1 capsule by mouth daily.     nitroGLYCERIN (NITROSTAT) 0.4 MG SL tablet Place 1 tablet (0.4 mg total) under the tongue every 5 (five) minutes as needed for chest pain. 30 tablet 0   pantoprazole (PROTONIX) 40 MG tablet Take 40 mg by mouth daily.     No current facility-administered medications for this visit.    Allergies:   Patient has no known allergies.    Social History:   reports that he quit smoking about 40 years ago. His smoking use included cigarettes. He has never used smokeless tobacco. He reports that he does not currently use alcohol. He reports that he does not use drugs.   Family History:  family history includes Seizures in his brother.    ROS:     Review of Systems  Constitutional: Negative.   HENT: Negative.    Eyes: Negative.   Respiratory: Negative.    Gastrointestinal: Negative.   Genitourinary: Negative.   Musculoskeletal: Negative.   Skin: Negative.   Neurological: Negative.   Endo/Heme/Allergies: Negative.   Psychiatric/Behavioral: Negative.    All other systems reviewed and are negative.     All other systems are reviewed and negative.    PHYSICAL EXAM: VS:  BP 120/65   Pulse 78   Ht 5\' 3"  (1.6 m)   Wt 137 lb (62.1 kg)   SpO2 95%   BMI 24.27 kg/m  , BMI Body  mass index is 24.27 kg/m. Last weight:  Wt Readings from Last 3 Encounters:  02/26/23 137 lb (62.1 kg)  02/19/23 136 lb 3.2 oz (61.8 kg)  02/12/23 135 lb (61.2 kg)     Physical Exam Vitals reviewed.  Constitutional:      Appearance: Normal appearance. He is normal weight.  HENT:     Head: Normocephalic.     Nose: Nose normal.     Mouth/Throat:     Mouth: Mucous membranes are moist.  Eyes:     Pupils: Pupils are equal, round, and reactive to light.  Cardiovascular:     Rate and Rhythm: Normal rate and regular rhythm.  Pulses: Normal pulses.     Heart sounds: Normal heart sounds.  Pulmonary:     Effort: Pulmonary effort is normal.  Abdominal:     General: Abdomen is flat. Bowel sounds are normal.  Musculoskeletal:        General: Normal range of motion.     Cervical back: Normal range of motion.  Skin:    General: Skin is warm.  Neurological:     General: No focal deficit present.     Mental Status: He is alert.  Psychiatric:        Mood and Affect: Mood normal.       EKG:   Recent Labs: 07/16/2022: Magnesium 2.0 10/02/2022: B Natriuretic Peptide 531.2 01/15/2023: ALT 14; BUN 30; Creatinine, Ser 1.55; Hemoglobin 9.9; Platelets 198; Potassium 4.4; Sodium 140    Lipid Panel    Component Value Date/Time   CHOL 141 05/27/2022 0520   CHOL 143 02/25/2020 1126   TRIG 159 (H) 05/27/2022 0520   HDL 32 (L) 05/27/2022 0520   HDL 31 (L) 02/25/2020 1126   CHOLHDL 4.4 05/27/2022 0520   VLDL 32 05/27/2022 0520   LDLCALC 77 05/27/2022 0520   LDLCALC 78 02/25/2020 1126      Other studies Reviewed: Additional studies/ records that were reviewed today include:  Review of the above records demonstrates:       No data to display            ASSESSMENT AND PLAN:    ICD-10-CM   1. Bilateral carotid artery stenosis  I65.23 furosemide (LASIX) 20 MG tablet    2. Essential hypertension  I10 furosemide (LASIX) 20 MG tablet    3. Coronary artery disease with hx  of myocardial infarct w/o hx of CABG  I25.10 furosemide (LASIX) 20 MG tablet   I25.2    stable    4. Chronic combined systolic and diastolic congestive heart failure (HCC)  I50.42 furosemide (LASIX) 20 MG tablet   compensated    5. Paroxysmal atrial fibrillation (HCC)  I48.0 furosemide (LASIX) 20 MG tablet       Problem List Items Addressed This Visit       Cardiovascular and Mediastinum   Essential hypertension   Relevant Medications   furosemide (LASIX) 20 MG tablet   Bilateral carotid artery disease (HCC) - Primary   Relevant Medications   furosemide (LASIX) 20 MG tablet   Coronary artery disease with hx of myocardial infarct w/o hx of CABG   Relevant Medications   furosemide (LASIX) 20 MG tablet   Paroxysmal atrial fibrillation (HCC)   Relevant Medications   furosemide (LASIX) 20 MG tablet   Chronic combined systolic and diastolic congestive heart failure (HCC)   Relevant Medications   furosemide (LASIX) 20 MG tablet       Disposition:   Return in about 3 months (around 05/28/2023).    Total time spent: 30 minutes  Signed,  Adrian Blackwater, MD  02/26/2023 2:23 PM    [facility]

## 2023-02-27 DIAGNOSIS — R278 Other lack of coordination: Secondary | ICD-10-CM | POA: Diagnosis not present

## 2023-02-27 DIAGNOSIS — M79641 Pain in right hand: Secondary | ICD-10-CM | POA: Diagnosis not present

## 2023-02-27 DIAGNOSIS — M79642 Pain in left hand: Secondary | ICD-10-CM | POA: Diagnosis not present

## 2023-02-27 DIAGNOSIS — R569 Unspecified convulsions: Secondary | ICD-10-CM | POA: Diagnosis not present

## 2023-02-28 ENCOUNTER — Ambulatory Visit
Admission: RE | Admit: 2023-02-28 | Discharge: 2023-02-28 | Disposition: A | Discharge status: Home / Self Care | Payer: Medicare HMO | Source: Ambulatory Visit | Attending: Neurosurgery | Admitting: Neurosurgery

## 2023-02-28 DIAGNOSIS — M48062 Spinal stenosis, lumbar region with neurogenic claudication: Secondary | ICD-10-CM | POA: Insufficient documentation

## 2023-02-28 DIAGNOSIS — M5106 Intervertebral disc disorders with myelopathy, lumbar region: Secondary | ICD-10-CM | POA: Diagnosis not present

## 2023-02-28 DIAGNOSIS — M549 Dorsalgia, unspecified: Secondary | ICD-10-CM | POA: Insufficient documentation

## 2023-02-28 DIAGNOSIS — G8929 Other chronic pain: Secondary | ICD-10-CM | POA: Insufficient documentation

## 2023-02-28 DIAGNOSIS — M4716 Other spondylosis with myelopathy, lumbar region: Secondary | ICD-10-CM | POA: Diagnosis not present

## 2023-02-28 DIAGNOSIS — M48061 Spinal stenosis, lumbar region without neurogenic claudication: Secondary | ICD-10-CM | POA: Diagnosis not present

## 2023-02-28 DIAGNOSIS — M47815 Spondylosis without myelopathy or radiculopathy, thoracolumbar region: Secondary | ICD-10-CM | POA: Diagnosis not present

## 2023-02-28 MED ORDER — GADOBUTROL 1 MMOL/ML IV SOLN
6.0000 mL | Freq: Once | INTRAVENOUS | Status: AC | PRN
  Administered 2023-02-28: 6 mL via INTRAVENOUS

## 2023-03-02 ENCOUNTER — Other Ambulatory Visit: Payer: Self-pay

## 2023-03-02 DIAGNOSIS — N179 Acute kidney failure, unspecified: Secondary | ICD-10-CM

## 2023-03-02 DIAGNOSIS — I5042 Chronic combined systolic (congestive) and diastolic (congestive) heart failure: Secondary | ICD-10-CM

## 2023-03-02 DIAGNOSIS — I951 Orthostatic hypotension: Secondary | ICD-10-CM

## 2023-03-02 DIAGNOSIS — R42 Dizziness and giddiness: Secondary | ICD-10-CM

## 2023-03-02 DIAGNOSIS — I480 Paroxysmal atrial fibrillation: Secondary | ICD-10-CM

## 2023-03-02 DIAGNOSIS — I2510 Atherosclerotic heart disease of native coronary artery without angina pectoris: Secondary | ICD-10-CM

## 2023-03-05 MED ORDER — AMIODARONE HCL 200 MG PO TABS
200.0000 mg | ORAL_TABLET | Freq: Every day | ORAL | 3 refills | Status: DC
Start: 2023-03-05 — End: 2023-08-08

## 2023-03-19 ENCOUNTER — Ambulatory Visit (INDEPENDENT_AMBULATORY_CARE_PROVIDER_SITE_OTHER): Payer: Medicare HMO

## 2023-03-19 DIAGNOSIS — R55 Syncope and collapse: Secondary | ICD-10-CM | POA: Diagnosis not present

## 2023-03-19 LAB — CUP PACEART REMOTE DEVICE CHECK
Date Time Interrogation Session: 20241013231017
Implantable Pulse Generator Implant Date: 20210503

## 2023-04-02 DIAGNOSIS — M6281 Muscle weakness (generalized): Secondary | ICD-10-CM | POA: Diagnosis not present

## 2023-04-02 DIAGNOSIS — R2681 Unsteadiness on feet: Secondary | ICD-10-CM | POA: Diagnosis not present

## 2023-04-02 DIAGNOSIS — M5459 Other low back pain: Secondary | ICD-10-CM | POA: Diagnosis not present

## 2023-04-02 NOTE — Progress Notes (Signed)
Carelink Summary Report / Loop Recorder 

## 2023-04-09 DIAGNOSIS — R2681 Unsteadiness on feet: Secondary | ICD-10-CM | POA: Diagnosis not present

## 2023-04-09 DIAGNOSIS — M5459 Other low back pain: Secondary | ICD-10-CM | POA: Diagnosis not present

## 2023-04-09 DIAGNOSIS — M6281 Muscle weakness (generalized): Secondary | ICD-10-CM | POA: Diagnosis not present

## 2023-04-10 DIAGNOSIS — N183 Chronic kidney disease, stage 3 unspecified: Secondary | ICD-10-CM | POA: Diagnosis not present

## 2023-04-10 DIAGNOSIS — E1122 Type 2 diabetes mellitus with diabetic chronic kidney disease: Secondary | ICD-10-CM | POA: Diagnosis not present

## 2023-04-11 DIAGNOSIS — M6281 Muscle weakness (generalized): Secondary | ICD-10-CM | POA: Diagnosis not present

## 2023-04-11 DIAGNOSIS — R2681 Unsteadiness on feet: Secondary | ICD-10-CM | POA: Diagnosis not present

## 2023-04-11 DIAGNOSIS — M5459 Other low back pain: Secondary | ICD-10-CM | POA: Diagnosis not present

## 2023-04-18 DIAGNOSIS — M6281 Muscle weakness (generalized): Secondary | ICD-10-CM | POA: Diagnosis not present

## 2023-04-18 DIAGNOSIS — R2681 Unsteadiness on feet: Secondary | ICD-10-CM | POA: Diagnosis not present

## 2023-04-18 DIAGNOSIS — M5459 Other low back pain: Secondary | ICD-10-CM | POA: Diagnosis not present

## 2023-04-20 DIAGNOSIS — R2681 Unsteadiness on feet: Secondary | ICD-10-CM | POA: Diagnosis not present

## 2023-04-20 DIAGNOSIS — M5459 Other low back pain: Secondary | ICD-10-CM | POA: Diagnosis not present

## 2023-04-20 DIAGNOSIS — M6281 Muscle weakness (generalized): Secondary | ICD-10-CM | POA: Diagnosis not present

## 2023-04-23 ENCOUNTER — Ambulatory Visit: Payer: Medicare HMO | Admitting: Neurosurgery

## 2023-04-23 ENCOUNTER — Encounter: Payer: Self-pay | Admitting: Neurosurgery

## 2023-04-23 ENCOUNTER — Ambulatory Visit (INDEPENDENT_AMBULATORY_CARE_PROVIDER_SITE_OTHER): Payer: Medicare HMO

## 2023-04-23 VITALS — BP 128/70 | Ht 63.0 in | Wt 133.0 lb

## 2023-04-23 DIAGNOSIS — G8929 Other chronic pain: Secondary | ICD-10-CM | POA: Diagnosis not present

## 2023-04-23 DIAGNOSIS — R55 Syncope and collapse: Secondary | ICD-10-CM

## 2023-04-23 DIAGNOSIS — M549 Dorsalgia, unspecified: Secondary | ICD-10-CM

## 2023-04-23 LAB — CUP PACEART REMOTE DEVICE CHECK
Date Time Interrogation Session: 20241117230311
Implantable Pulse Generator Implant Date: 20210503

## 2023-04-23 NOTE — Progress Notes (Signed)
Referring Physician:  Lauro Regulus, MD [address] [facility] [place],  [place] [address]  Primary Physician:  Lauro Regulus, MD  History of Present Illness: 04/23/2023 Mr. Stavely is here today for follow-up.  He was previously seen after a follow-up in the emergency department where he presented on 02/12/2023.  He had severe back pain at that time which took him to present to the emergency department.  He followed up with [place] and was having some symptoms that sounded as if they were a right sided lumbosacral radiculopathy.  He has had this before so we suggested physical therapy as well as possible injections as he was not having any red flag signs or symptoms.  Today he follows up after physical therapy, he feels that he is doing much better.  He states that his back pain is much more well-controlled as well as his right sided lower extremity pain.  Overall he feels like he is moving in a much better direction and is having an improvement in his function.  He would like to avoid surgery if at all possible.  I have utilized the care everywhere function in epic to review the outside records available from external health systems.  Review of Systems:  A 10 point review of systems is negative, except for the pertinent positives and negatives detailed in the HPI.  Past Medical History: Past Medical History:  Diagnosis Date   CAD (coronary artery disease) of artery bypass graft    s/p CABG x 4 in 1997   CHF (congestive heart failure) (HCC)    COPD (chronic obstructive pulmonary disease) (HCC)    Coronary artery disease    CVA (cerebral vascular accident) (HCC)    Diabetes mellitus without complication (HCC)    Dysrhythmia    GERD (gastroesophageal reflux disease)    HLD (hyperlipidemia)    Hypertension    S/P TAVR (transcatheter aortic valve replacement) 07/05/2021   with Edwards 26mm S3UR via TF approach with Dr. Excell Seltzer and Dr. Laneta Simmers   Seizures  [facility])     Past Surgical History: Past Surgical History:  Procedure Laterality Date   BYPASS GRAFT ANGIOGRAPHY N/A 04/25/2021   Procedure: BYPASS GRAFT ANGIOGRAPHY;  Surgeon: Armando Reichert, MD;  Location: [facility] INVASIVE CV LAB;  Service: Cardiovascular;  Laterality: N/A;   CARDIAC SURGERY     CORNEAL TRANSPLANT Right    CORONARY ARTERY BYPASS GRAFT  1997   ELECTROPHYSIOLOGY STUDY N/A 09/08/2021   Procedure: ELECTROPHYSIOLOGY STUDY;  Surgeon: Lanier Prude, MD;  Location: [facility] INVASIVE CV LAB;  Service: Cardiovascular;  Laterality: N/A;   EYE SURGERY     FLEXOR TENDON REPAIR Left 07/11/2019   Procedure: FLEXOR tenolysis  REPAIR LEFT RING FINGER with tednon repair;  Surgeon: Kennedy Bucker, MD;  Location: [facility] ORS;  Service: Orthopedics;  Laterality: Left;   INCISION AND DRAINAGE ABSCESS Left 05/08/2019   Procedure: INCISION AND DRAINAGE ABSCESS;  Surgeon: Donato Heinz, MD;  Location: [facility] ORS;  Service: Orthopedics;  Laterality: Left;   INTRAOPERATIVE TRANSTHORACIC ECHOCARDIOGRAM N/A 07/05/2021   Procedure: INTRAOPERATIVE TRANSTHORACIC ECHOCARDIOGRAM;  Surgeon: Tonny Bollman, MD;  Location: [facility] OR;  Service: Open Heart Surgery;  Laterality: N/A;   LEFT HEART CATH AND CORONARY ANGIOGRAPHY Left 06/09/2022   Procedure: LEFT HEART CATH AND CORONARY ANGIOGRAPHY;  Surgeon: Laurier Nancy, MD;  Location: [facility] INVASIVE CV LAB;  Service: Cardiovascular;  Laterality: Left;   LEFT HEART CATH AND CORS/GRAFTS ANGIOGRAPHY N/A 10/02/2019  Procedure: LEFT HEART CATH AND CORS/GRAFTS ANGIOGRAPHY;  Surgeon: Marykay Lex, MD;  Location: [facility] INVASIVE CV LAB;  Service: Cardiovascular;  Laterality: N/A;   LEFT HEART CATH AND CORS/GRAFTS ANGIOGRAPHY N/A 07/17/2022   Procedure: LEFT HEART CATH AND CORS/GRAFTS ANGIOGRAPHY and possible PCI and stent;  Surgeon: Laurier Nancy, MD;  Location: [facility] INVASIVE CV LAB;  Service: Cardiovascular;  Laterality: N/A;   LOOP RECORDER INSERTION N/A 10/06/2019    Procedure: LOOP RECORDER INSERTION;  Surgeon: Marinus Maw, MD;  Location: [facility] INVASIVE CV LAB;  Service: Cardiovascular;  Laterality: N/A;   TRANSCATHETER AORTIC VALVE REPLACEMENT, TRANSFEMORAL N/A 07/05/2021   Procedure: TRANSCATHETER AORTIC VALVE REPLACEMENT, TRANSFEMORAL;  Surgeon: Tonny Bollman, MD;  Location: [facility] OR;  Service: Open Heart Surgery;  Laterality: N/A;   TRIGGER FINGER RELEASE Left     Allergies: Allergies as of 04/23/2023   (No Known Allergies)    Medications:  Current Outpatient Medications:    acetaminophen (TYLENOL) 650 MG CR tablet, Take 1,300 mg by mouth every 8 (eight) hours as needed for pain., Disp: , Rfl:    amiodarone (PACERONE) 200 MG tablet, Take 1 tablet (200 mg total) by mouth daily., Disp: 30 tablet, Rfl: 3   apixaban (ELIQUIS) 5 MG TABS tablet, Take 1 tablet (5 mg total) by mouth 2 (two) times daily., Disp: 180 tablet, Rfl: 1   atorvastatin (LIPITOR) 80 MG tablet, Take 1 tablet (80 mg total) by mouth at bedtime., Disp: 30 tablet, Rfl: 1   Calcium Citrate-Vitamin D (CALCIUM + D PO), Take 1 tablet by mouth 2 (two) times daily., Disp: , Rfl:    clopidogrel (PLAVIX) 75 MG tablet, Take 1 tablet (75 mg total) by mouth daily., Disp: 30 tablet, Rfl: 11   dapagliflozin propanediol (FARXIGA) 10 MG TABS tablet, Take 1 tablet (10 mg total) by mouth daily before breakfast., Disp: 30 tablet, Rfl: 5   divalproex (DEPAKOTE ER) 250 MG 24 hr tablet, Take 1 tablet (250 mg total) by mouth at bedtime., Disp: 30 tablet, Rfl: 0   Dulaglutide 3 MG/0.5ML SOPN, Inject 3 mg into the skin once a week., Disp: , Rfl:    furosemide (LASIX) 20 MG tablet, Take 1 tablet (20 mg total) by mouth daily., Disp: 30 tablet, Rfl: 1   isosorbide mononitrate (IMDUR) 30 MG 24 hr tablet, Take 1/2 tab dialy, Disp: 30 tablet, Rfl: 1   metoprolol succinate (TOPROL-XL) 25 MG 24 hr tablet, Take 0.5 tablets (12.5 mg total) by mouth daily., Disp: 30 tablet, Rfl: 0   Multiple Vitamin (MULTIVITAMIN) capsule,  Take 1 capsule by mouth daily., Disp: , Rfl:    nitroGLYCERIN (NITROSTAT) 0.4 MG SL tablet, Place 1 tablet (0.4 mg total) under the tongue every 5 (five) minutes as needed for chest pain., Disp: 30 tablet, Rfl: 0   pantoprazole (PROTONIX) 40 MG tablet, Take 40 mg by mouth daily., Disp: , Rfl:   Social History: Social History   Tobacco Use   Smoking status: Former    Current packs/day: 0.00    Types: Cigarettes    Quit date: 1984    Years since quitting: 40.9   Smokeless tobacco: Never   Tobacco comments:    Quit 40 years ago  Vaping Use   Vaping status: Never Used  Substance Use Topics   Alcohol use: Not Currently   Drug use: Never    Family Medical History: Family History  Problem Relation Age of Onset   Seizures Brother     Physical Examination: Vitals:  04/23/23 0942  BP: 128/70    General: Patient is in no apparent distress. Attention to examination is appropriate.  Neck:   Supple.  Full range of motion.  Respiratory: Patient is breathing without any difficulty.   NEUROLOGICAL:     Awake, alert, oriented to person, place, and time.  Speech is clear and fluent.   Cranial Nerves: Pupils equal round and reactive to light.  Facial tone is symmetric.  Facial sensation is symmetric. Shoulder shrug is symmetric. Tongue protrusion is midline.    Strength:  Side Iliopsoas Quads Hamstring PF DF EHL  R 5 5 5 5 5 5   L 5 5 5 5 5 5    No major sensory loss, reflexes are hyporeflexic throughout.     No evidence of dysmetria noted.  Gait is normal.    Straight leg raise on the right is now negative  Imaging: Narrative & Impression  CLINICAL DATA:  Myelopathy, chronic, lumbar spine Lumbar Spondylosis with chronic worsening L3-4 dermatome pain   EXAM: MRI LUMBAR SPINE WITHOUT AND WITH CONTRAST   TECHNIQUE: Multiplanar and multiecho pulse sequences of the lumbar spine were obtained without and with intravenous contrast.   CONTRAST:  6mL GADAVIST GADOBUTROL 1  MMOL/ML IV SOLN   COMPARISON:  None Available.   FINDINGS: Segmentation:  Standard.   Alignment:  Trace retrolisthesis of L3 on L4.   Vertebrae: No fracture, evidence of discitis, or bone lesion. There is a Schmorl's node at the superior endplate of T12 and L5.   Conus medullaris and cauda equina: Conus extends to the L2 level. Conus and cauda equina appear normal.   Paraspinal and other soft tissues: Negative.   Disc levels:   T12-L1: Mild bilateral facet degenerative change. No spinal canal narrowing. No neural foraminal narrowing.   L1-L2: Mild bilateral facet degenerative change. Minimal disc bulge. No spinal canal narrowing. Mild bilateral neuroforaminal narrowing.   L2-L3: Mild bilateral facet degenerative change. Minimal disc bulge. Spinal canal narrowing. Moderate bilateral neural foraminal narrowing.   L3-L4: Severe disc space loss. Mild bilateral facet degenerative change. Eccentric right disc bulge. Moderate spinal canal narrowing. Severe bilateral neural foraminal narrowing.   L4-L5: Moderate disc space loss. Mild bilateral facet degenerative change. Circumferential disc bulge. Mild spinal canal narrowing. Moderate to severe bilateral neural foraminal narrowing.   L5-S1: Mild bilateral facet degenerative change. Minimal disc bulge. No spinal canal narrowing. Moderate to severe bilateral neural foraminal narrowing.   IMPRESSION: 1. Moderate spinal canal narrowing at L3-L4. 2. Severe bilateral neural foraminal narrowing at L3-L4. 3. Moderate to severe bilateral neural foraminal narrowing at L4-L5 and L5-S1.     Electronically Signed   By: Lorenza Cambridge M.D.   On: 03/21/2023 14:01    I have personally reviewed the images and agree with the above interpretation, he does show multilevel spondylosis, the level that appeared to be a calcified disc is mostly a calcified osteophytic outgrowth.  This does show some stenosis especially in the foramen.  Medical  Decision Making/Assessment and Plan: Mr. Yannuzzi is a pleasant 67 y.o. male with chronic history of back pain and family history of spondylosis and multiple spinal surgeries.  He has had back pain for many years, however has had a worsened course over the past few months and severely worse over the past 2 weeks.  He was seen in the emergency department.  CT scan demonstrated multilevel spondylosis with a disc osteophyte complex or calcified disc causing likely significant stenosis.  Given the fact that he  was having radicular symptoms at that time we evaluated him with an MRI which showed expected stenosis secondary to a outgrowth of an osteophyte rather than a disc herniation that was calcified secondarily.  At this point however he has worked consistently with physical therapy and he feels that he is doing much better.  He is not having as much back or leg pain at this time.  He would like to continue to work with conservative therapy and avoid surgery if possible.  We agree with him that he is not having any progressive deficits or worsening of his functional status.  Would like to follow-up with him as needed.  He will plan to reach out if he has any worsening.  We discussed the risks benefits and expected outcomes of the chosen treatment plan.   We spent 30 minutes on his care today, that included record review of physical therapy, imaging review, in person examination and history taking, discussion of his care going forward and coordination of his care.  Lovenia Kim MD/MSCR Neurosurgery

## 2023-04-24 DIAGNOSIS — R2681 Unsteadiness on feet: Secondary | ICD-10-CM | POA: Diagnosis not present

## 2023-04-24 DIAGNOSIS — M5459 Other low back pain: Secondary | ICD-10-CM | POA: Diagnosis not present

## 2023-04-24 DIAGNOSIS — M6281 Muscle weakness (generalized): Secondary | ICD-10-CM | POA: Diagnosis not present

## 2023-04-27 DIAGNOSIS — M5459 Other low back pain: Secondary | ICD-10-CM | POA: Diagnosis not present

## 2023-04-27 DIAGNOSIS — R2681 Unsteadiness on feet: Secondary | ICD-10-CM | POA: Diagnosis not present

## 2023-04-27 DIAGNOSIS — M6281 Muscle weakness (generalized): Secondary | ICD-10-CM | POA: Diagnosis not present

## 2023-04-30 DIAGNOSIS — E119 Type 2 diabetes mellitus without complications: Secondary | ICD-10-CM | POA: Diagnosis not present

## 2023-05-10 DIAGNOSIS — I129 Hypertensive chronic kidney disease with stage 1 through stage 4 chronic kidney disease, or unspecified chronic kidney disease: Secondary | ICD-10-CM | POA: Diagnosis not present

## 2023-05-10 DIAGNOSIS — I2510 Atherosclerotic heart disease of native coronary artery without angina pectoris: Secondary | ICD-10-CM | POA: Diagnosis not present

## 2023-05-10 DIAGNOSIS — R569 Unspecified convulsions: Secondary | ICD-10-CM | POA: Diagnosis not present

## 2023-05-10 DIAGNOSIS — Z87891 Personal history of nicotine dependence: Secondary | ICD-10-CM | POA: Diagnosis not present

## 2023-05-10 DIAGNOSIS — G5601 Carpal tunnel syndrome, right upper limb: Secondary | ICD-10-CM | POA: Diagnosis not present

## 2023-05-10 DIAGNOSIS — E1122 Type 2 diabetes mellitus with diabetic chronic kidney disease: Secondary | ICD-10-CM | POA: Diagnosis not present

## 2023-05-10 DIAGNOSIS — I4891 Unspecified atrial fibrillation: Secondary | ICD-10-CM | POA: Diagnosis not present

## 2023-05-10 DIAGNOSIS — N183 Chronic kidney disease, stage 3 unspecified: Secondary | ICD-10-CM | POA: Diagnosis not present

## 2023-05-16 NOTE — Progress Notes (Signed)
Carelink Summary Report / Loop Recorder 

## 2023-05-22 ENCOUNTER — Other Ambulatory Visit: Payer: Self-pay | Admitting: Cardiovascular Disease

## 2023-05-22 DIAGNOSIS — I480 Paroxysmal atrial fibrillation: Secondary | ICD-10-CM

## 2023-05-22 DIAGNOSIS — I10 Essential (primary) hypertension: Secondary | ICD-10-CM

## 2023-05-22 DIAGNOSIS — I252 Old myocardial infarction: Secondary | ICD-10-CM

## 2023-05-22 DIAGNOSIS — I5042 Chronic combined systolic (congestive) and diastolic (congestive) heart failure: Secondary | ICD-10-CM

## 2023-05-22 DIAGNOSIS — I6523 Occlusion and stenosis of bilateral carotid arteries: Secondary | ICD-10-CM

## 2023-05-23 MED ORDER — FUROSEMIDE 20 MG PO TABS
20.0000 mg | ORAL_TABLET | Freq: Every day | ORAL | 1 refills | Status: DC
Start: 2023-05-23 — End: 2024-01-19

## 2023-05-28 ENCOUNTER — Ambulatory Visit (INDEPENDENT_AMBULATORY_CARE_PROVIDER_SITE_OTHER): Payer: Medicare HMO

## 2023-05-28 ENCOUNTER — Encounter: Payer: Self-pay | Admitting: Cardiovascular Disease

## 2023-05-28 ENCOUNTER — Ambulatory Visit: Payer: Medicare HMO | Admitting: Cardiovascular Disease

## 2023-05-28 VITALS — BP 110/72 | HR 75 | Ht 63.0 in | Wt 135.6 lb

## 2023-05-28 DIAGNOSIS — I480 Paroxysmal atrial fibrillation: Secondary | ICD-10-CM | POA: Diagnosis not present

## 2023-05-28 DIAGNOSIS — I2510 Atherosclerotic heart disease of native coronary artery without angina pectoris: Secondary | ICD-10-CM

## 2023-05-28 DIAGNOSIS — I5042 Chronic combined systolic (congestive) and diastolic (congestive) heart failure: Secondary | ICD-10-CM | POA: Diagnosis not present

## 2023-05-28 DIAGNOSIS — I10 Essential (primary) hypertension: Secondary | ICD-10-CM | POA: Diagnosis not present

## 2023-05-28 DIAGNOSIS — R55 Syncope and collapse: Secondary | ICD-10-CM | POA: Diagnosis not present

## 2023-05-28 DIAGNOSIS — I252 Old myocardial infarction: Secondary | ICD-10-CM | POA: Diagnosis not present

## 2023-05-28 DIAGNOSIS — E782 Mixed hyperlipidemia: Secondary | ICD-10-CM | POA: Diagnosis not present

## 2023-05-28 DIAGNOSIS — R0602 Shortness of breath: Secondary | ICD-10-CM | POA: Diagnosis not present

## 2023-05-28 LAB — CUP PACEART REMOTE DEVICE CHECK
Date Time Interrogation Session: 20241222230327
Implantable Pulse Generator Implant Date: 20210503

## 2023-05-28 NOTE — Progress Notes (Signed)
Cardiology Office Note   Date:  05/28/2023   ID:  Thomas Moore, DOB 1956/01/04, MRN [id number]  PCP:  Lauro Regulus, MD  Cardiologist:  Adrian Blackwater, MD      History of Present Illness: Thomas Moore is a 67 y.o. male who presents for  Chief Complaint  Patient presents with   Follow-up    3 month follow     Feel ok  /    Past Medical History:  Diagnosis Date   CAD (coronary artery disease) of artery bypass graft    s/p CABG x 4 in 1997   CHF (congestive heart failure) (HCC)    COPD (chronic obstructive pulmonary disease) (HCC)    Coronary artery disease    CVA (cerebral vascular accident) (HCC)    Diabetes mellitus without complication (HCC)    Dysrhythmia    GERD (gastroesophageal reflux disease)    HLD (hyperlipidemia)    Hypertension    S/P TAVR (transcatheter aortic valve replacement) 07/05/2021   with Edwards 26mm S3UR via TF approach with Dr. Excell Seltzer and Dr. Laneta Simmers   Seizures [facility])      Past Surgical History:  Procedure Laterality Date   BYPASS GRAFT ANGIOGRAPHY N/A 04/25/2021   Procedure: BYPASS GRAFT ANGIOGRAPHY;  Surgeon: Armando Reichert, MD;  Location: [facility] INVASIVE CV LAB;  Service: Cardiovascular;  Laterality: N/A;   CARDIAC SURGERY     CORNEAL TRANSPLANT Right    CORONARY ARTERY BYPASS GRAFT  1997   ELECTROPHYSIOLOGY STUDY N/A 09/08/2021   Procedure: ELECTROPHYSIOLOGY STUDY;  Surgeon: Lanier Prude, MD;  Location: [facility] INVASIVE CV LAB;  Service: Cardiovascular;  Laterality: N/A;   EYE SURGERY     FLEXOR TENDON REPAIR Left 07/11/2019   Procedure: FLEXOR tenolysis  REPAIR LEFT RING FINGER with tednon repair;  Surgeon: Kennedy Bucker, MD;  Location: [facility] ORS;  Service: Orthopedics;  Laterality: Left;   INCISION AND DRAINAGE ABSCESS Left 05/08/2019   Procedure: INCISION AND DRAINAGE ABSCESS;  Surgeon: Donato Heinz, MD;  Location: [facility] ORS;  Service: Orthopedics;  Laterality: Left;   INTRAOPERATIVE TRANSTHORACIC ECHOCARDIOGRAM N/A  07/05/2021   Procedure: INTRAOPERATIVE TRANSTHORACIC ECHOCARDIOGRAM;  Surgeon: Tonny Bollman, MD;  Location: [facility] OR;  Service: Open Heart Surgery;  Laterality: N/A;   LEFT HEART CATH AND CORONARY ANGIOGRAPHY Left 06/09/2022   Procedure: LEFT HEART CATH AND CORONARY ANGIOGRAPHY;  Surgeon: Laurier Nancy, MD;  Location: [facility] INVASIVE CV LAB;  Service: Cardiovascular;  Laterality: Left;   LEFT HEART CATH AND CORS/GRAFTS ANGIOGRAPHY N/A 10/02/2019   Procedure: LEFT HEART CATH AND CORS/GRAFTS ANGIOGRAPHY;  Surgeon: Marykay Lex, MD;  Location: [facility] INVASIVE CV LAB;  Service: Cardiovascular;  Laterality: N/A;   LEFT HEART CATH AND CORS/GRAFTS ANGIOGRAPHY N/A 07/17/2022   Procedure: LEFT HEART CATH AND CORS/GRAFTS ANGIOGRAPHY and possible PCI and stent;  Surgeon: Laurier Nancy, MD;  Location: [facility] INVASIVE CV LAB;  Service: Cardiovascular;  Laterality: N/A;   LOOP RECORDER INSERTION N/A 10/06/2019   Procedure: LOOP RECORDER INSERTION;  Surgeon: Marinus Maw, MD;  Location: [facility] INVASIVE CV LAB;  Service: Cardiovascular;  Laterality: N/A;   TRANSCATHETER AORTIC VALVE REPLACEMENT, TRANSFEMORAL N/A 07/05/2021   Procedure: TRANSCATHETER AORTIC VALVE REPLACEMENT, TRANSFEMORAL;  Surgeon: Tonny Bollman, MD;  Location: [facility] OR;  Service: Open Heart Surgery;  Laterality: N/A;   TRIGGER FINGER RELEASE Left      Current Outpatient Medications  Medication Sig Dispense Refill   acetaminophen (TYLENOL) 650 MG CR tablet Take 1,300 mg by  mouth every 8 (eight) hours as needed for pain.     amiodarone (PACERONE) 200 MG tablet Take 1 tablet (200 mg total) by mouth daily. 30 tablet 3   apixaban (ELIQUIS) 5 MG TABS tablet Take 1 tablet (5 mg total) by mouth 2 (two) times daily. 180 tablet 1   atorvastatin (LIPITOR) 80 MG tablet Take 1 tablet (80 mg total) by mouth at bedtime. 30 tablet 1   Calcium Citrate-Vitamin D (CALCIUM + D PO) Take 1 tablet by mouth 2 (two) times daily.     clopidogrel (PLAVIX) 75 MG tablet Take 1  tablet (75 mg total) by mouth daily. 30 tablet 11   dapagliflozin propanediol (FARXIGA) 10 MG TABS tablet Take 1 tablet (10 mg total) by mouth daily before breakfast. 30 tablet 5   divalproex (DEPAKOTE ER) 250 MG 24 hr tablet Take 1 tablet (250 mg total) by mouth at bedtime. 30 tablet 0   Dulaglutide 3 MG/0.5ML SOPN Inject 3 mg into the skin once a week.     furosemide (LASIX) 20 MG tablet Take 1 tablet (20 mg total) by mouth daily. 30 tablet 1   isosorbide mononitrate (IMDUR) 30 MG 24 hr tablet Take 1/2 tab dialy 30 tablet 1   JARDIANCE 25 MG TABS tablet Take 25 mg by mouth daily.     metoprolol succinate (TOPROL-XL) 25 MG 24 hr tablet Take 0.5 tablets (12.5 mg total) by mouth daily. 30 tablet 0   Multiple Vitamin (MULTIVITAMIN) capsule Take 1 capsule by mouth daily.     nitroGLYCERIN (NITROSTAT) 0.4 MG SL tablet Place 1 tablet (0.4 mg total) under the tongue every 5 (five) minutes as needed for chest pain. 30 tablet 0   pantoprazole (PROTONIX) 40 MG tablet Take 40 mg by mouth daily.     No current facility-administered medications for this visit.    Allergies:   Patient has no known allergies.    Social History:   reports that he quit smoking about 41 years ago. His smoking use included cigarettes. He has never used smokeless tobacco. He reports that he does not currently use alcohol. He reports that he does not use drugs.   Family History:  family history includes Seizures in his brother.    ROS:     Review of Systems  Constitutional: Negative.   HENT: Negative.    Eyes: Negative.   Respiratory: Negative.    Gastrointestinal: Negative.   Genitourinary: Negative.   Musculoskeletal: Negative.   Skin: Negative.   Neurological: Negative.   Endo/Heme/Allergies: Negative.   Psychiatric/Behavioral: Negative.    All other systems reviewed and are negative.     All other systems are reviewed and negative.    PHYSICAL EXAM: VS:  BP 110/72   Pulse 75   Ht 5\' 3"  (1.6 m)   Wt 135  lb 9.6 oz (61.5 kg)   SpO2 98%   BMI 24.02 kg/m  , BMI Body mass index is 24.02 kg/m. Last weight:  Wt Readings from Last 3 Encounters:  05/28/23 135 lb 9.6 oz (61.5 kg)  04/23/23 133 lb (60.3 kg)  02/26/23 137 lb (62.1 kg)     Physical Exam Vitals reviewed.  Constitutional:      Appearance: Normal appearance. He is normal weight.  HENT:     Head: Normocephalic.     Nose: Nose normal.     Mouth/Throat:     Mouth: Mucous membranes are moist.  Eyes:     Pupils: Pupils are equal, round, and  reactive to light.  Cardiovascular:     Rate and Rhythm: Normal rate and regular rhythm.     Pulses: Normal pulses.     Heart sounds: Normal heart sounds.  Pulmonary:     Effort: Pulmonary effort is normal.  Abdominal:     General: Abdomen is flat. Bowel sounds are normal.  Musculoskeletal:        General: Normal range of motion.     Cervical back: Normal range of motion.  Skin:    General: Skin is warm.  Neurological:     General: No focal deficit present.     Mental Status: He is alert.  Psychiatric:        Mood and Affect: Mood normal.       EKG:   Recent Labs: 07/16/2022: Magnesium 2.0 10/02/2022: B Natriuretic Peptide 531.2 01/15/2023: ALT 14; BUN 30; Creatinine, Ser 1.55; Hemoglobin 9.9; Platelets 198; Potassium 4.4; Sodium 140    Lipid Panel    Component Value Date/Time   CHOL 141 05/27/2022 0520   CHOL 143 02/25/2020 1126   TRIG 159 (H) 05/27/2022 0520   HDL 32 (L) 05/27/2022 0520   HDL 31 (L) 02/25/2020 1126   CHOLHDL 4.4 05/27/2022 0520   VLDL 32 05/27/2022 0520   LDLCALC 77 05/27/2022 0520   LDLCALC 78 02/25/2020 1126      Other studies Reviewed: Additional studies/ records that were reviewed today include:  Review of the above records demonstrates:       No data to display            ASSESSMENT AND PLAN:    ICD-10-CM   1. Paroxysmal atrial fibrillation (HCC)  I48.0    In nSR    2. Chronic combined systolic and diastolic congestive heart  failure (HCC)  I50.42     3. Coronary artery disease with hx of myocardial infarct w/o hx of CABG  I25.10    I25.2    no chest pain    4. Essential hypertension  I10     5. SOB (shortness of breath)  R06.02    Walks around his apartment and not usualy SOB    6. Mixed hyperlipidemia  E78.2        Problem List Items Addressed This Visit       Cardiovascular and Mediastinum   Essential hypertension   Coronary artery disease with hx of myocardial infarct w/o hx of CABG   Paroxysmal atrial fibrillation (HCC) - Primary   Chronic combined systolic and diastolic congestive heart failure (HCC)     Other   HLD (hyperlipidemia)   SOB (shortness of breath)       Disposition:   Return in about 3 months (around 08/26/2023).    Total time spent: 30 minutes  Signed,  Adrian Blackwater, MD  05/28/2023 9:59 AM    [facility]

## 2023-06-13 DIAGNOSIS — Z87891 Personal history of nicotine dependence: Secondary | ICD-10-CM | POA: Diagnosis not present

## 2023-06-13 DIAGNOSIS — M545 Low back pain, unspecified: Secondary | ICD-10-CM | POA: Diagnosis not present

## 2023-06-13 DIAGNOSIS — Z8249 Family history of ischemic heart disease and other diseases of the circulatory system: Secondary | ICD-10-CM | POA: Diagnosis not present

## 2023-06-13 DIAGNOSIS — Z7984 Long term (current) use of oral hypoglycemic drugs: Secondary | ICD-10-CM | POA: Diagnosis not present

## 2023-06-13 DIAGNOSIS — I4891 Unspecified atrial fibrillation: Secondary | ICD-10-CM | POA: Diagnosis not present

## 2023-06-13 DIAGNOSIS — M4800 Spinal stenosis, site unspecified: Secondary | ICD-10-CM | POA: Diagnosis not present

## 2023-06-13 DIAGNOSIS — Z7901 Long term (current) use of anticoagulants: Secondary | ICD-10-CM | POA: Diagnosis not present

## 2023-06-13 DIAGNOSIS — Z95820 Peripheral vascular angioplasty status with implants and grafts: Secondary | ICD-10-CM | POA: Diagnosis not present

## 2023-06-13 DIAGNOSIS — H9193 Unspecified hearing loss, bilateral: Secondary | ICD-10-CM | POA: Diagnosis not present

## 2023-06-13 DIAGNOSIS — I429 Cardiomyopathy, unspecified: Secondary | ICD-10-CM | POA: Diagnosis not present

## 2023-06-13 DIAGNOSIS — G40909 Epilepsy, unspecified, not intractable, without status epilepticus: Secondary | ICD-10-CM | POA: Diagnosis not present

## 2023-06-13 DIAGNOSIS — N400 Enlarged prostate without lower urinary tract symptoms: Secondary | ICD-10-CM | POA: Diagnosis not present

## 2023-06-13 DIAGNOSIS — I4892 Unspecified atrial flutter: Secondary | ICD-10-CM | POA: Diagnosis not present

## 2023-06-13 DIAGNOSIS — N1831 Chronic kidney disease, stage 3a: Secondary | ICD-10-CM | POA: Diagnosis not present

## 2023-06-13 DIAGNOSIS — J439 Emphysema, unspecified: Secondary | ICD-10-CM | POA: Diagnosis not present

## 2023-06-13 DIAGNOSIS — I509 Heart failure, unspecified: Secondary | ICD-10-CM | POA: Diagnosis not present

## 2023-06-13 DIAGNOSIS — E1151 Type 2 diabetes mellitus with diabetic peripheral angiopathy without gangrene: Secondary | ICD-10-CM | POA: Diagnosis not present

## 2023-06-13 DIAGNOSIS — D6869 Other thrombophilia: Secondary | ICD-10-CM | POA: Diagnosis not present

## 2023-06-13 DIAGNOSIS — I2510 Atherosclerotic heart disease of native coronary artery without angina pectoris: Secondary | ICD-10-CM | POA: Diagnosis not present

## 2023-06-13 DIAGNOSIS — I130 Hypertensive heart and chronic kidney disease with heart failure and stage 1 through stage 4 chronic kidney disease, or unspecified chronic kidney disease: Secondary | ICD-10-CM | POA: Diagnosis not present

## 2023-06-13 DIAGNOSIS — I252 Old myocardial infarction: Secondary | ICD-10-CM | POA: Diagnosis not present

## 2023-06-20 IMAGING — CT CT HEART MORP W/ CTA COR W/ SCORE W/ CA W/CM &/OR W/O CM
2 of 5 series · 13 of 20 positions shown, 15 images · IV contrast (APPLIED)
Comparison: None.
COMPARISON: None.

Addendum:
EXAM:
OVER-READ INTERPRETATION  CT CHEST

The following report is an over-read performed by radiologist Dr.
Seifu Haji Yuye [REDACTED] on 06/08/2021. This
over-read does not include interpretation of cardiac or coronary
anatomy or pathology. The coronary calcium score/coronary CTA
interpretation by the cardiologist is attached.
CLINICAL DATA: Severe Aortic Stenosis.
Cardiac TAVR CT
TECHNIQUE: A non-contrast, gated CT scan was obtained with axial slices of 3 mm
through the heart for aortic valve calcium scoring. A 130 kV
retrospective, gated, contrast cardiac scan was obtained. Gantry
rotation speed was 250 msecs and collimation was 0.6 mm.
Nitroglycerin was not given. The 3D data set was reconstructed in 5%
intervals of the 0-95% of the R-R cycle. Systolic and diastolic
phases were analyzed on a dedicated workstation using MPR, MIP, and
VRT modes. The patient received 100 cc of contrast.

[Series 5: 0-90% · axial · 0.41mm/px · z∈[-505,-408]mm · 6 of 2260 slices shown]
[im 323/2260  vessel]
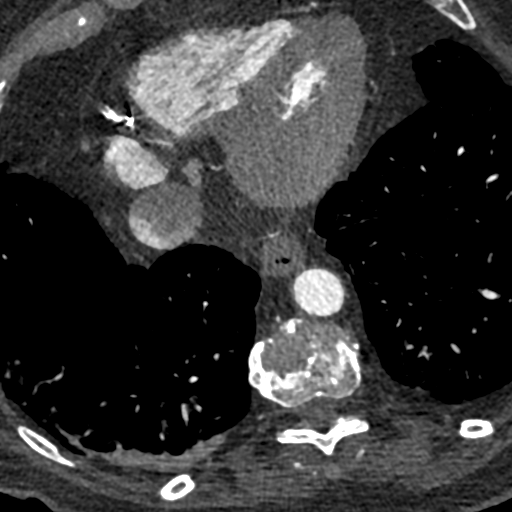
[im 646/2260  vessel]
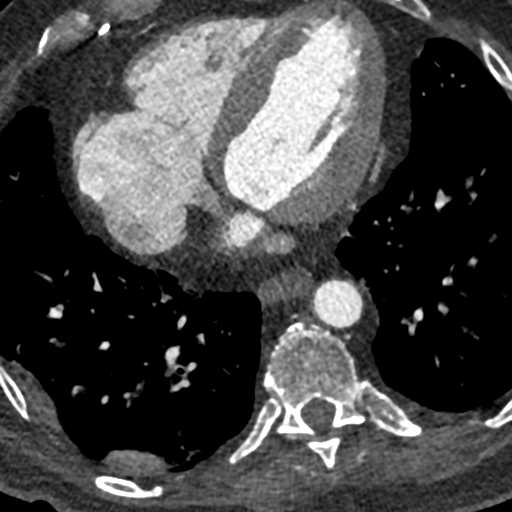
[im 969/2260  vessel]
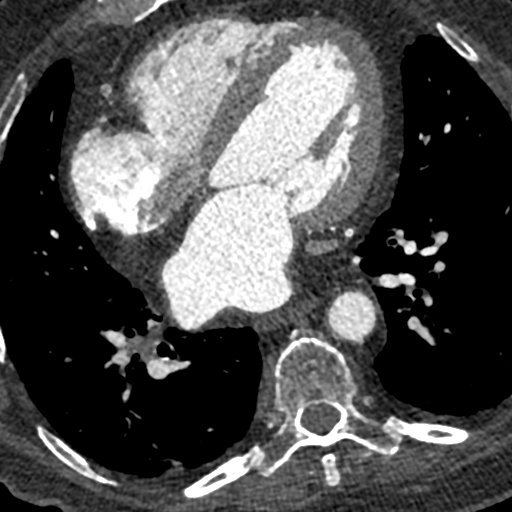
[im 1291/2260  vessel]
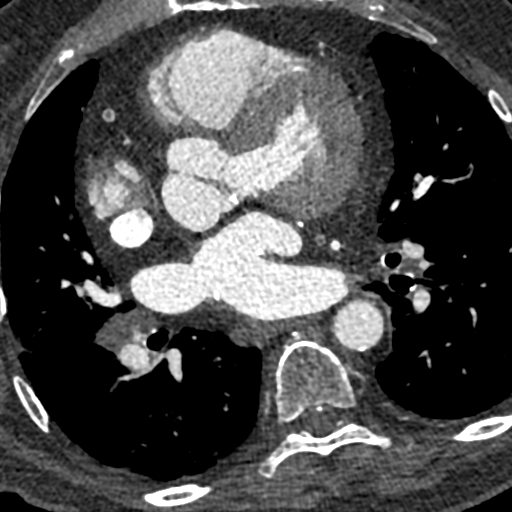
[im 1614/2260  vessel]
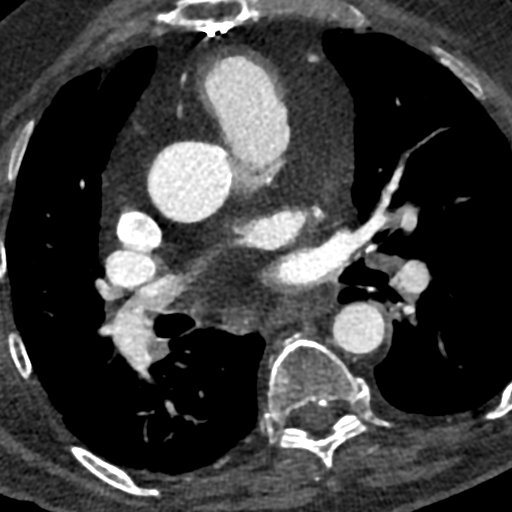
[im 1937/2260  vessel]
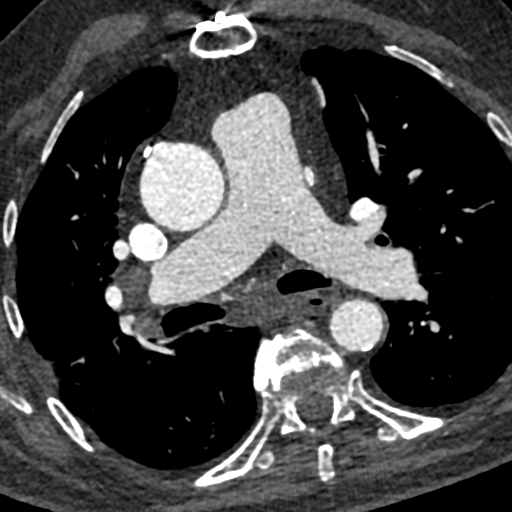

[Series 6: 5-95% · axial · 0.41mm/px · z∈[-507,-406]mm · 7 of 2247 slices shown, 9 images]
[im 281/2247  vessel]
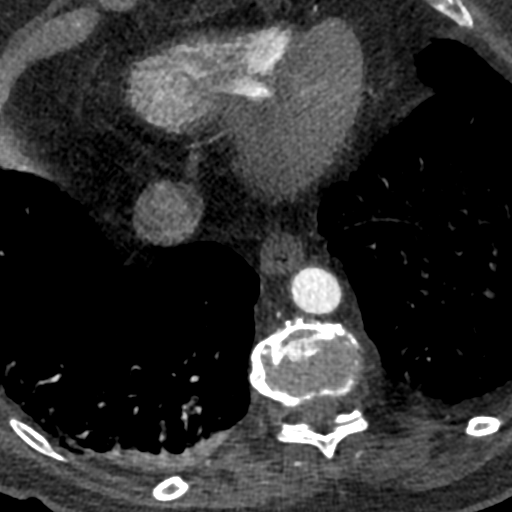
[im 281/2247  lung]
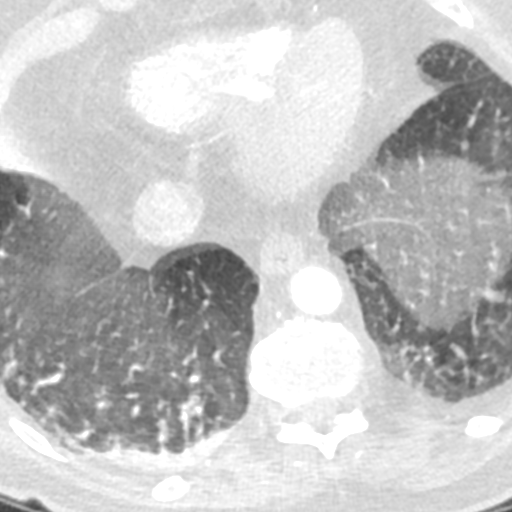
[im 562/2247  vessel]
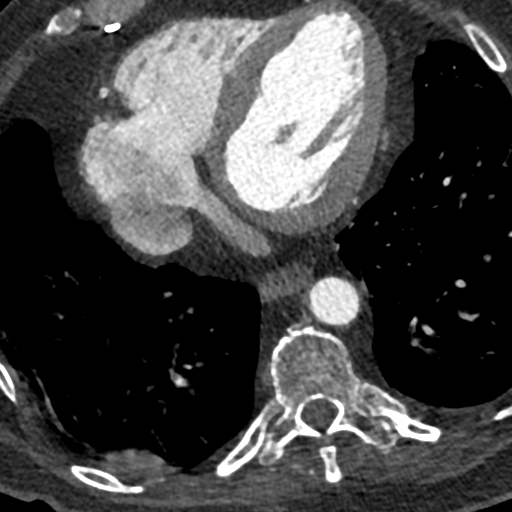
[im 843/2247  vessel]
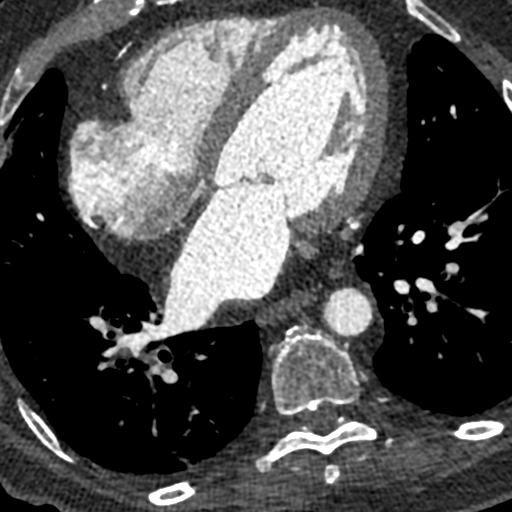
[im 1124/2247  vessel]
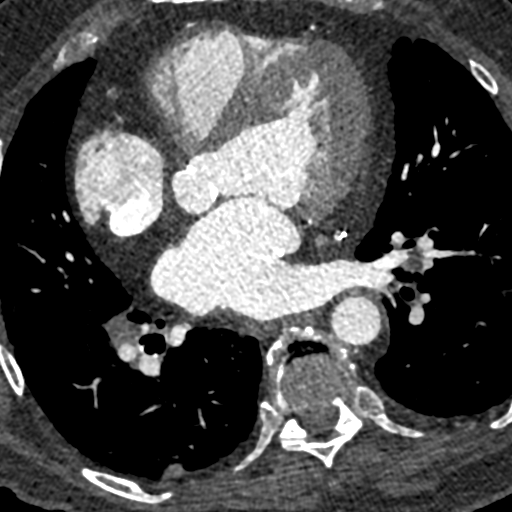
[im 1404/2247  vessel]
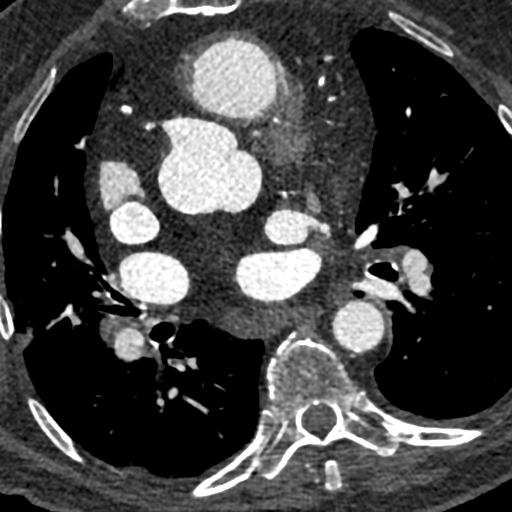
[im 1404/2247  lung]
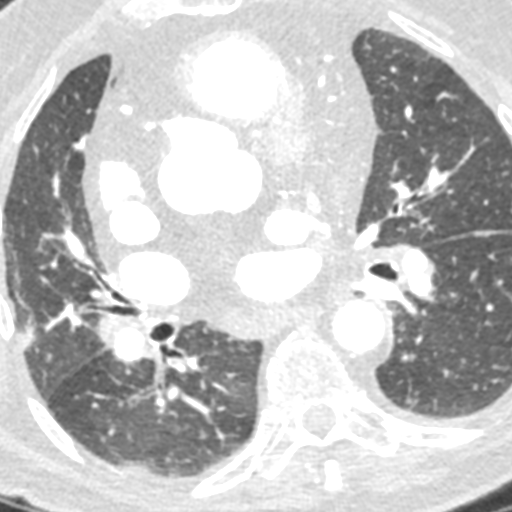
[im 1685/2247  vessel]
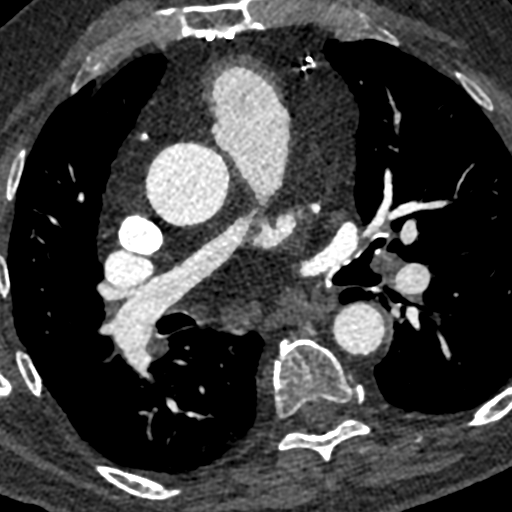
[im 1966/2247  vessel]
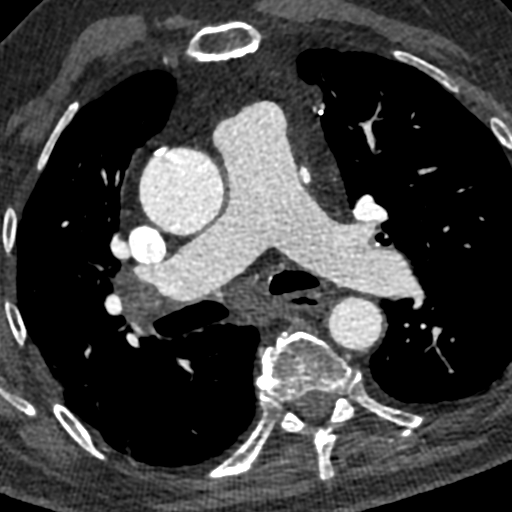

[13 of 20 positions shown; findings below may reference images not displayed]

FINDINGS: Extracardiac findings will be described separately under dictation
for contemporaneously obtained CTA chest, abdomen and pelvis.
IMPRESSION: Please see separate dictation for contemporaneously obtained CTA
chest, abdomen and pelvis dated 06/08/2021 for full description of
relevant extracardiac findings.
FINDINGS: Image quality: Excellent.

Noise artifact is: Limited.

Valve Morphology: The aortic valve tricuspid. The valve is diffusely
calcified. There is restricted motion in systole consistent with
severe aortic stenosis.

Aortic Valve Calcium score: 7597

Aortic annular dimension:

Phase assessed: 25%

Annular area: 464 mm2

Annular perimeter: 79.4 mm

Max diameter: 29.0 mm

Min diameter: 21.6 mm

Annular and subannular calcification: Trace, single calcification
under the LCC that is layered.

Membranous septum: 3.3 mm

Optimal coplanar projection: RAO 19 TIGER 2

Coronary Artery Height above Annulus:

Left Main: 14.0 mm

Right Coronary: 23.4 mm

Sinus of Valsalva Measurements:

Non-coronary: 38.5 mm

Right-coronary: 39.8 mm

Left-coronary: 40.2 mm

Sinus of Valsalva Height:

Non-coronary: 25.6 mm

Right-coronary: 27.1 mm

Left-coronary: 23.7 mm

Sinotubular Junction: 3.26 mm

Ascending Thoracic Aorta: 35.8 mm

Coronary Arteries: Normal coronary origin. Right dominance. The
study was performed without use of NTG and is insufficient for
plaque evaluation. Please refer to recent cardiac catheterization
for coronary assessment. [REDACTED]-LAD is patent. AWA to an OM branch is
patent. WARREN KAGO is occluded. Severe 3-vessel coronary calcifications
present of the native circulation.

Cardiac Morphology:

Right Atrium: Right atrial size is within normal limits. Contrast
reflux into the IVC consistent with elevated RA pressure.

Right Ventricle: The right ventricular cavity is within normal
limits.

Left Atrium: Left atrial size is normal in size. Contrast mixing
artifact is present in the MASSIMILIANO, cannot exclude thrombus.

Left Ventricle: The ventricular cavity size is within normal limits.
There are no stigmata of prior infarction. There is no abnormal
filling defect. Mildly reduced left ventricular function, LVEF=46%.
Global hypokinesis. Postoperative septal movement.

Pulmonary arteries: Normal in size without proximal filling defect.

Pulmonary veins: Normal pulmonary venous drainage.

Pericardium: Normal thickness with no significant effusion or
calcium present.

Mitral Valve: The mitral valve is normal structure without
significant calcification.

Extra-cardiac findings: See attached radiology report for
non-cardiac structures.
IMPRESSION: 1. Annular measurements appropriate for 26 mm S3 TAVR (464 mm2).

2. Trace, single calcification under the LCC that is layered.

3. Sufficient coronary to annulus distance.

4. Short membranous septum length (3.3 mm).

5. Optimal Fluoroscopic Angle for Delivery: RAO 19 TIGER 2

6. Mildly reduced left ventricular function, LVEF=46%. Global
hypokinesis.

7. Contrast mixing artifact is present in the MASSIMILIANO, cannot exclude
thrombus.

8. Patent [REDACTED]-LAD and HALCOMB.  Occluded WARREN KAGO.

*** End of Addendum ***
EXAM:
OVER-READ INTERPRETATION  CT CHEST

The following report is an over-read performed by radiologist Dr.
Seifu Haji Yuye [REDACTED] on 06/08/2021. This
over-read does not include interpretation of cardiac or coronary
anatomy or pathology. The coronary calcium score/coronary CTA
interpretation by the cardiologist is attached.
FINDINGS: Extracardiac findings will be described separately under dictation
for contemporaneously obtained CTA chest, abdomen and pelvis.
IMPRESSION: Please see separate dictation for contemporaneously obtained CTA
chest, abdomen and pelvis dated 06/08/2021 for full description of
relevant extracardiac findings.

## 2023-06-20 IMAGING — CT CT ANGIO CHEST
3 of 15 series · 17 of 37 positions shown · IV contrast (APPLIED)
Comparison: CT chest dated April 23, 2021

CLINICAL DATA: 65 year-old male, pre treatment planning for TAVR

EXAM:
CTA chest, ABDOMEN AND PELVIS WITHOUT AND WITH CONTRAST
TECHNIQUE: Multidetector CT imaging of the chest, abdomen and pelvis was
performed using the standard protocol during bolus administration of
intravenous contrast. Multiplanar reconstructed images and MIPs were
obtained and reviewed to evaluate the vascular anatomy.
CONTRAST:  100mL OMNIPAQUE IOHEXOL 350 MG/ML SOLN

[Series 5: 0-90% · axial · 0.41mm/px · z∈[-507,-423]mm · 6 of 2260 slices shown]
[im 283/2260  lung]
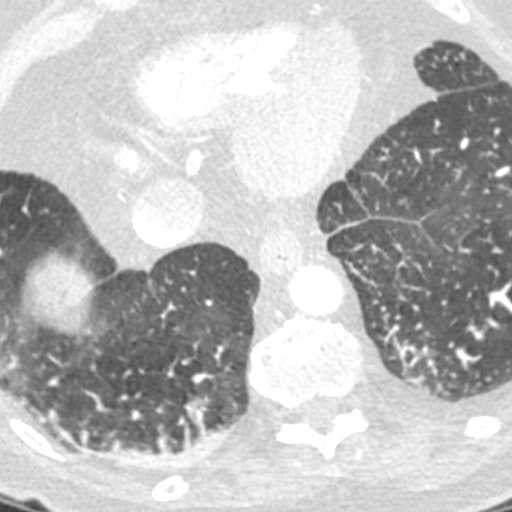
[im 565/2260  lung]
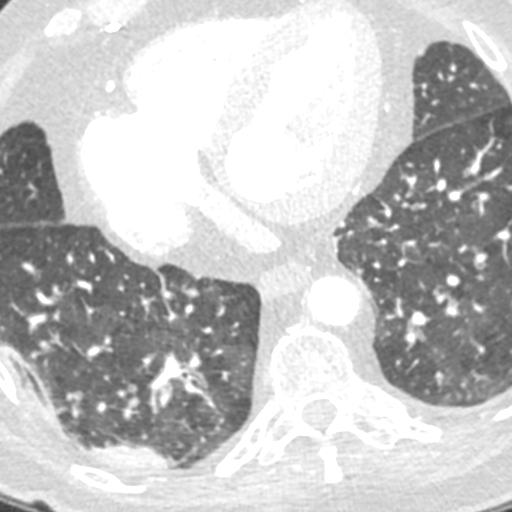
[im 848/2260  lung]
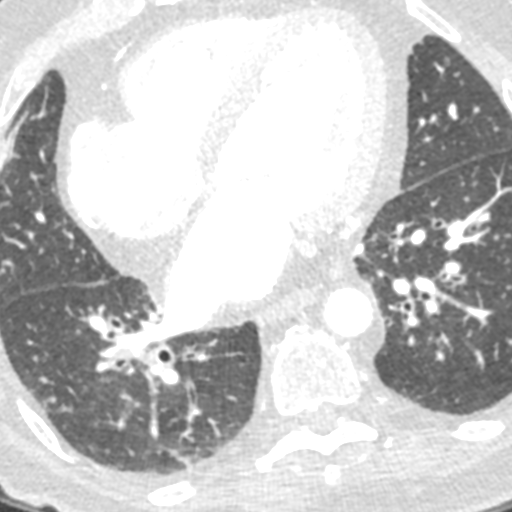
[im 1130/2260  lung]
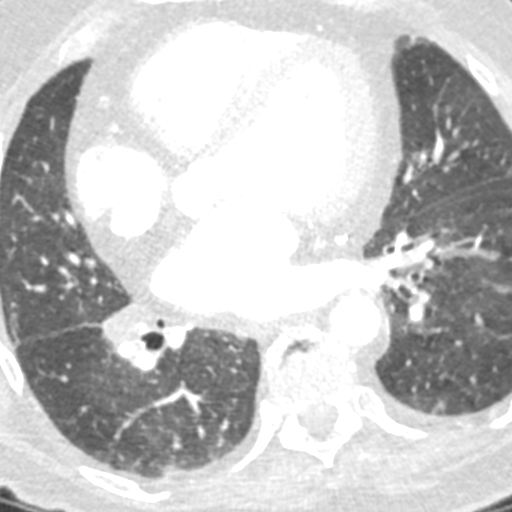
[im 1412/2260  lung]
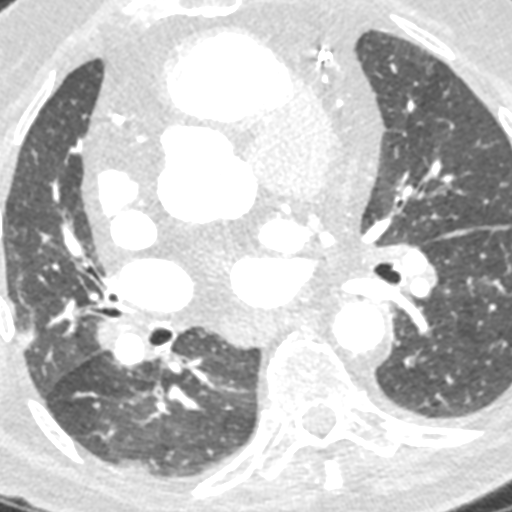
[im 1695/2260  lung]
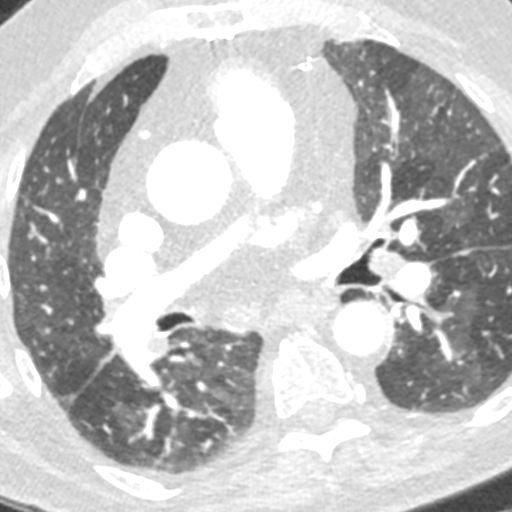

[Series 6: 5-95% · axial · 0.41mm/px · z∈[-509,-404]mm · 8 of 2260 slices shown]
[im 252/2260  lung]
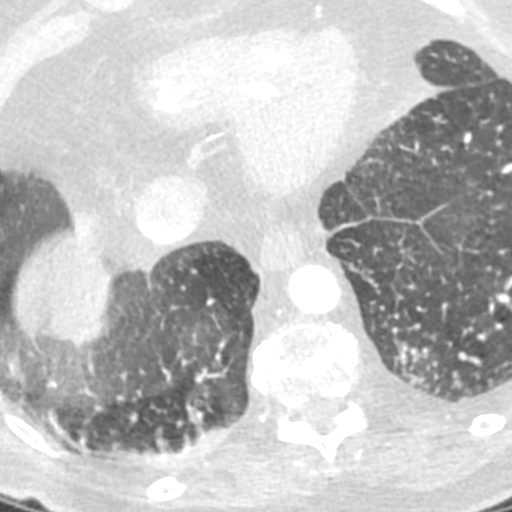
[im 503/2260  lung]
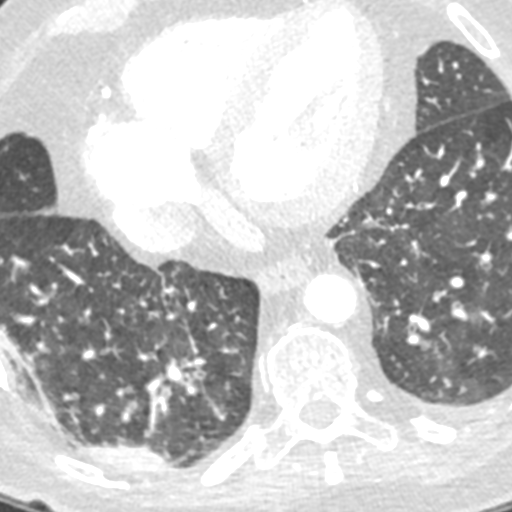
[im 754/2260  lung]
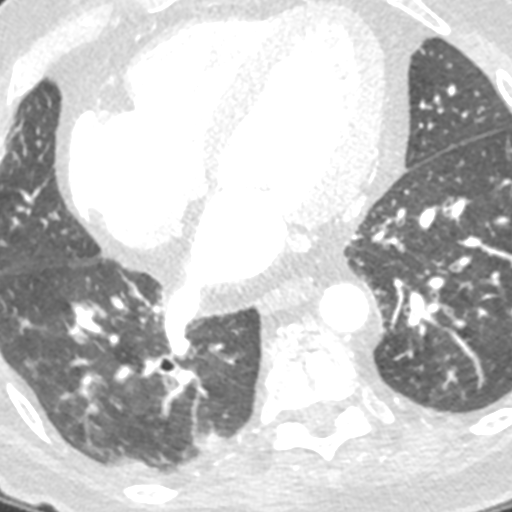
[im 1005/2260  lung]
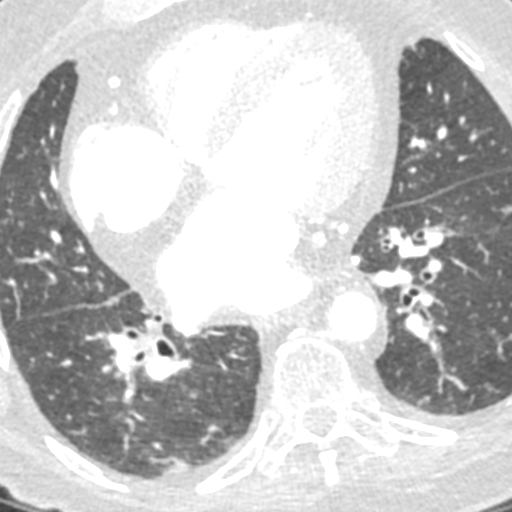
[im 1256/2260  lung]
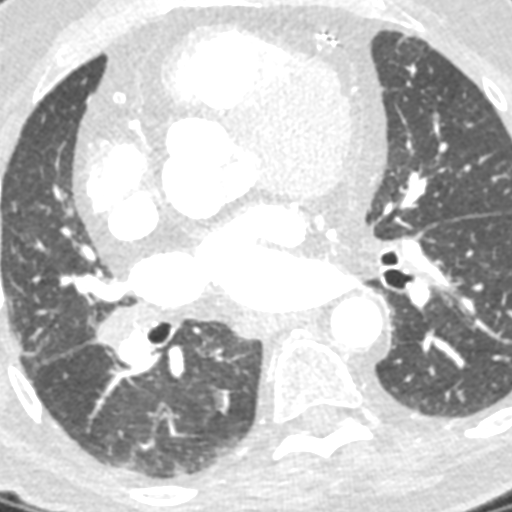
[im 1507/2260  lung]
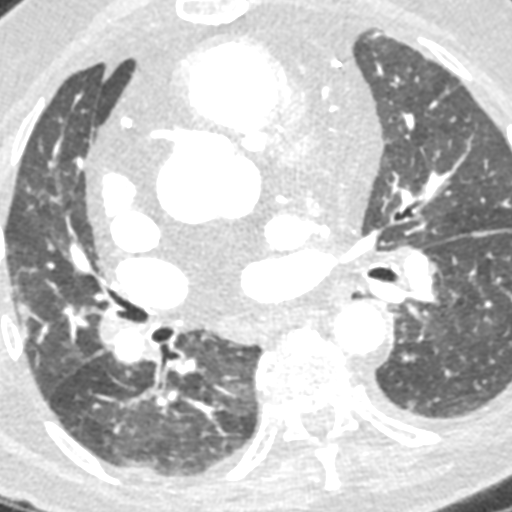
[im 1758/2260  lung]
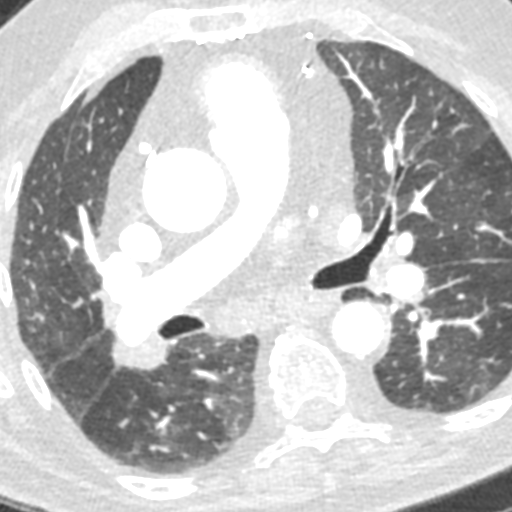
[im 2009/2260  lung]
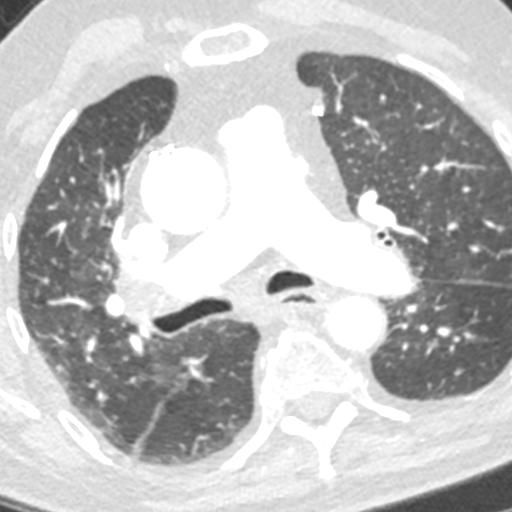

[Series 11: ax thins · axial · 0.59mm/px · z∈[-918,-254]mm · 3 of 665 slices shown]
[im 1/665  lung]
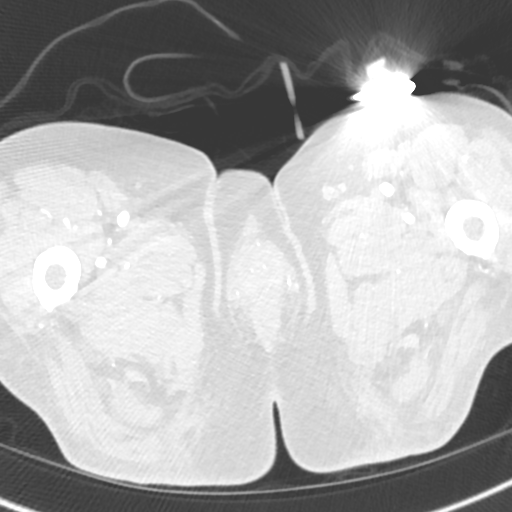
[im 333/665  mediastinal]
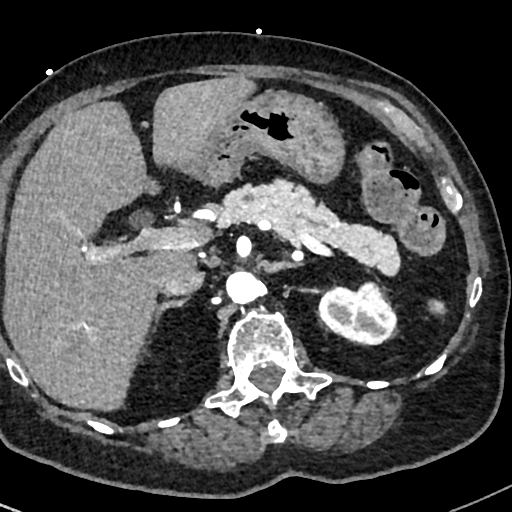
[im 665/665  lung]
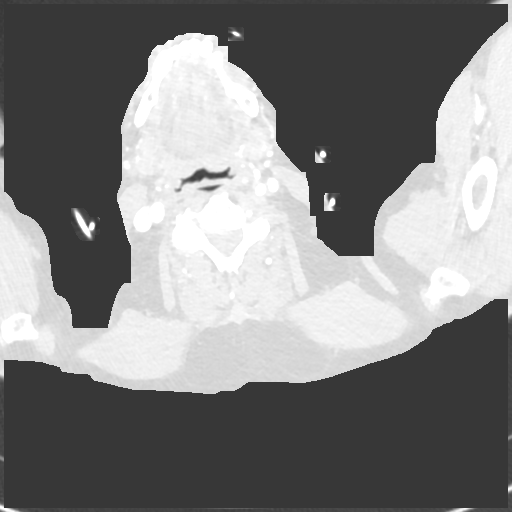

[17 of 37 positions shown; findings below may reference images not displayed]

FINDINGS: CTA CHEST FINDINGS

Cardiovascular: Cardiomegaly. No significant pericardial
effusion/thickening. Left main and three-vessel coronary artery
calcifications status post CABG. Patent vein graft to a diagonal
artery and patent [REDACTED] to the LAD. Occluded RCA vein graft. Great
vessels are normal in course and caliber. No central pulmonary
emboli.

Mediastinum/Nodes: No discrete thyroid nodules. Patulous esophagus.
Enlarged mediastinal and hilar lymph nodes, slightly decreased in
size when compared with prior exam. Reference subcarinal lymph node
measuring 1.5 cm in short axis on series 12, image 57, previously
1.8 cm. And reference right hilar lymph node measuring 1.2 cm on
series 12, image 69.

Lungs/Pleura: No pneumothorax. No pleural effusion. Right greater
than left bibasilar atelectasis. No consolidation.

Musculoskeletal: No aggressive appearing focal osseous lesions.
Median sternotomy wires are intact.

CTA ABDOMEN AND PELVIS FINDINGS

Hepatobiliary: Normal liver with no liver mass. Normal gallbladder
with no radiopaque cholelithiasis. No biliary ductal dilatation.

Pancreas: Normal, with no mass or duct dilation.

Spleen: Normal size. No mass.

Adrenals/Urinary Tract: Normal adrenals. Kidneys enhance
symmetrically with no evidence of hydronephrosis. Normal bladder.

Stomach/Bowel: Normal non-distended stomach. Normal caliber small
bowel with no small bowel wall thickening. Normal appendix. Normal
large bowel with no diverticulosis, large bowel wall thickening or
pericolonic fat stranding.

Vascular/Lymphatic: Normal caliber abdominal aorta with calcified
and noncalcified plaque. Major branch vessels are patent. Two
bilateral renal arteries with mild narrowing at the origins due to
calcified and noncalcified plaque. No pathologically enlarged lymph
nodes in the abdomen or pelvis.

Reproductive: Prostatomegaly, measuring up to 5.2 cm.

Other: No pneumoperitoneum, ascites or focal fluid collection.

Musculoskeletal: No aggressive appearing focal osseous lesions. Mild
superior endplate deformity of L5 with associated Schmorl's node.

VASCULAR MEASUREMENTS PERTINENT TO TAVR:

AORTA:

Minimal Aortic Qiameter-J.9 mm

Severity of Aortic Calcification-moderate to severe

RIGHT PELVIS:

Right Common Iliac Artery -

Minimal Tiameter-K.V mm

Tortuosity-none

Calcification-moderate

Right External Iliac Artery -

Minimal Eiameter-Q.6 mm

Tortuosity-mild

Calcification-mild

Right Common Femoral Artery -

Minimal Yiameter-F.B mm

Tortuosity-none

Calcification-mild

LEFT PELVIS:

Left Common Iliac Artery -

Minimal 3iameter-4.0 mm

Tortuosity-none

Calcification-moderate

Left External Iliac Artery -

Minimal Aiameter-D.A mm

Tortuosity-none

Calcification-mild

Left Common Femoral Artery -

Minimal Eiameter-4.K mm

Tortuosity-none

Calcification-mild

Review of the MIP images confirms the above findings.
IMPRESSION: 1. Vascular findings and measurements pertinent to potential TAVR
procedure, as detailed above.
2. Severe thickening calcification of the aortic valve, compatible
with reported clinical history of severe aortic stenosis.
3. Moderate to severe aortoiliac atherosclerosis. Left main and 3
vessel coronary artery disease status post CABG.
4. Enlarged mediastinal and hilar lymph nodes are stable to slightly
decreased in size when compared with prior exam and likely reactive.

## 2023-06-22 ENCOUNTER — Emergency Department: Payer: Medicare HMO

## 2023-06-22 ENCOUNTER — Other Ambulatory Visit: Payer: Self-pay

## 2023-06-22 ENCOUNTER — Emergency Department
Admission: EM | Admit: 2023-06-22 | Discharge: 2023-06-23 | Disposition: A | Discharge status: Home / Self Care | Payer: Medicare HMO | Attending: Emergency Medicine | Admitting: Emergency Medicine

## 2023-06-22 DIAGNOSIS — R079 Chest pain, unspecified: Secondary | ICD-10-CM | POA: Diagnosis not present

## 2023-06-22 DIAGNOSIS — R918 Other nonspecific abnormal finding of lung field: Secondary | ICD-10-CM | POA: Diagnosis not present

## 2023-06-22 DIAGNOSIS — J205 Acute bronchitis due to respiratory syncytial virus: Secondary | ICD-10-CM | POA: Diagnosis not present

## 2023-06-22 DIAGNOSIS — Z20822 Contact with and (suspected) exposure to covid-19: Secondary | ICD-10-CM | POA: Insufficient documentation

## 2023-06-22 DIAGNOSIS — I2510 Atherosclerotic heart disease of native coronary artery without angina pectoris: Secondary | ICD-10-CM | POA: Diagnosis not present

## 2023-06-22 DIAGNOSIS — Z8673 Personal history of transient ischemic attack (TIA), and cerebral infarction without residual deficits: Secondary | ICD-10-CM | POA: Insufficient documentation

## 2023-06-22 DIAGNOSIS — R0789 Other chest pain: Secondary | ICD-10-CM | POA: Diagnosis not present

## 2023-06-22 DIAGNOSIS — R059 Cough, unspecified: Secondary | ICD-10-CM | POA: Diagnosis not present

## 2023-06-22 DIAGNOSIS — R7989 Other specified abnormal findings of blood chemistry: Secondary | ICD-10-CM | POA: Diagnosis not present

## 2023-06-22 LAB — CBC
HCT: 39.2 % (ref 39.0–52.0)
Hemoglobin: 11.8 g/dL — ABNORMAL LOW (ref 13.0–17.0)
MCH: 26.9 pg (ref 26.0–34.0)
MCHC: 30.1 g/dL (ref 30.0–36.0)
MCV: 89.3 fL (ref 80.0–100.0)
Platelets: 164 10*3/uL (ref 150–400)
RBC: 4.39 MIL/uL (ref 4.22–5.81)
RDW: 16.5 % — ABNORMAL HIGH (ref 11.5–15.5)
WBC: 6 10*3/uL (ref 4.0–10.5)
nRBC: 0 % (ref 0.0–0.2)

## 2023-06-22 LAB — BASIC METABOLIC PANEL
Anion gap: 13 (ref 5–15)
BUN: 23 mg/dL (ref 8–23)
CO2: 24 mmol/L (ref 22–32)
Calcium: 8.8 mg/dL — ABNORMAL LOW (ref 8.9–10.3)
Chloride: 98 mmol/L (ref 98–111)
Creatinine, Ser: 1.4 mg/dL — ABNORMAL HIGH (ref 0.61–1.24)
GFR, Estimated: 55 mL/min — ABNORMAL LOW (ref >60)
Glucose, Bld: 164 mg/dL — ABNORMAL HIGH (ref 70–99)
Potassium: 3.5 mmol/L (ref 3.5–5.1)
Sodium: 135 mmol/L (ref 135–145)

## 2023-06-22 LAB — TROPONIN I (HIGH SENSITIVITY)
Troponin I (High Sensitivity): 29 ng/L — ABNORMAL HIGH (ref <18)
Troponin I (High Sensitivity): 36 ng/L — ABNORMAL HIGH (ref <18)

## 2023-06-22 NOTE — ED Triage Notes (Signed)
Pt to ed from home via [facility] for CP. Pt has HX of MI. [place] was in 2001 per pt. HX of stroke. Pt has had a cold and cough last few days. Pt pain is now relieved. Pt is hard of hearing. Pt is caox4, in no acute distress in triage.    91 98% 154/83 BGL 221 - known diabetic

## 2023-06-23 LAB — RESP PANEL BY RT-PCR (RSV, FLU A&B, COVID)  RVPGX2
Influenza A by PCR: NEGATIVE
Influenza B by PCR: NEGATIVE
Resp Syncytial Virus by PCR: POSITIVE — AB
SARS Coronavirus 2 by RT PCR: NEGATIVE

## 2023-06-23 LAB — D-DIMER, QUANTITATIVE: D-Dimer, Quant: 0.53 ug{FEU}/mL — ABNORMAL HIGH (ref 0.00–0.50)

## 2023-06-23 LAB — BRAIN NATRIURETIC PEPTIDE: B Natriuretic Peptide: 842.2 pg/mL — ABNORMAL HIGH (ref 0.0–100.0)

## 2023-06-23 LAB — TROPONIN I (HIGH SENSITIVITY): Troponin I (High Sensitivity): 40 ng/L — ABNORMAL HIGH (ref <18)

## 2023-06-23 MED ORDER — IPRATROPIUM-ALBUTEROL 0.5-2.5 (3) MG/3ML IN SOLN
3.0000 mL | Freq: Once | RESPIRATORY_TRACT | Status: AC
  Administered 2023-06-23: 3 mL via RESPIRATORY_TRACT
  Filled 2023-06-23: qty 3

## 2023-06-23 MED ORDER — BENZONATATE 100 MG PO CAPS
100.0000 mg | ORAL_CAPSULE | Freq: Once | ORAL | Status: AC
  Administered 2023-06-23: 100 mg via ORAL
  Filled 2023-06-23: qty 1

## 2023-06-23 MED ORDER — BENZONATATE 100 MG PO CAPS: 100.0000 mg | ORAL_CAPSULE | Freq: Three times a day (TID) | ORAL | 0 refills | Status: DC | PRN

## 2023-06-23 NOTE — ED Provider Notes (Signed)
[facility] Provider Note    Event Date/Time   First MD Initiated Contact with Patient 06/23/23 [phone]     (approximate)   History   Chest Pain   HPI  Thomas Moore is a 68 y.o. male with an extensive history of coronary disease, also prior stroke.  Currently on active anticoagulation which he reports he has been compliant with  He has had a couple days of a cough and sinus congestion.  He reports no chest pain.  Rather just reports he has a persistent cough.  He has not noticed any new leg swelling, he has been taking his blood thinner not missing doses, he is not have any chest pain  No noted fever.  Feels like he has a cold  He does have a history of previous pulmonary embolism     Physical Exam   Triage Vital Signs: ED Triage Vitals  Encounter Vitals Group     BP 06/22/23 1644 139/70     Systolic BP Percentile --      Diastolic BP Percentile --      Pulse Rate 06/22/23 1644 87     Resp 06/22/23 1644 16     Temp 06/22/23 1644 98 F (36.7 C)     Temp Source 06/22/23 1644 Oral     SpO2 06/22/23 1644 98 %     Weight --      Height --      Head Circumference --      Peak Flow --      Pain Score 06/22/23 1642 0     Pain Loc --      Pain Education --      Exclude from Growth Chart --     Most recent vital signs: Vitals:   06/23/23 0100 06/23/23 0230  BP: (!) 147/88 134/60  Pulse: (!) 114 94  Resp: (!) 26 16  Temp:    SpO2: 97% 94%     General: Awake, no distress.  Very pleasant sits up without difficulty.  Frequent dry nonproductive cough noted. CV:  Good peripheral perfusion.  Normal tones and rate Resp:  Normal effort.  Clear lungs bilaterally but frequent dry cough when taking deep inspiration.  No noted wheezing.  Some central rhonchi are noted with coughing Abd:  No distention.  Other:  No appreciated unilateral edema   ED Results / Procedures / Treatments   Labs (all labs ordered are listed, but only abnormal results  are displayed) Labs Reviewed  RESP PANEL BY RT-PCR (RSV, FLU A&B, COVID)  RVPGX2 - Abnormal; Notable for the following components:      Result Value   Resp Syncytial Virus by PCR POSITIVE (*)    All other components within normal limits  CBC - Abnormal; Notable for the following components:   Hemoglobin 11.8 (*)    RDW 16.5 (*)    All other components within normal limits  BASIC METABOLIC PANEL - Abnormal; Notable for the following components:   Glucose, Bld 164 (*)    Creatinine, Ser 1.40 (*)    Calcium 8.8 (*)    GFR, Estimated 55 (*)    All other components within normal limits  D-DIMER, QUANTITATIVE - Abnormal; Notable for the following components:   D-Dimer, Quant 0.53 (*)    All other components within normal limits  BRAIN NATRIURETIC PEPTIDE - Abnormal; Notable for the following components:   B Natriuretic Peptide 842.2 (*)    All other components within normal  limits  TROPONIN I (HIGH SENSITIVITY) - Abnormal; Notable for the following components:   Troponin I (High Sensitivity) 29 (*)    All other components within normal limits  TROPONIN I (HIGH SENSITIVITY) - Abnormal; Notable for the following components:   Troponin I (High Sensitivity) 36 (*)    All other components within normal limits  TROPONIN I (HIGH SENSITIVITY) - Abnormal; Notable for the following components:   Troponin I (High Sensitivity) 40 (*)    All other components within normal limits   Labs demonstrate very minimally elevated troponins, previously elevated troponins are also noted and he seems to have some element of troponinemia.  He has no symptoms that are highly suggestive of ACS though, no chest pain in particular.  He reports to me and dry cough and congestion as his concern.  Will send additional troponin, if this is not showing acute elevation I think this is very unlikely represent ACS.  His symptoms seem to be most likely consistent with a mild bronchitis or upper respiratory type picture.  Very  mild anemia.  Creatinine 1.4.  COVID influenza negative, RSV positive  Repeat troponin very minimally uptrending, no severe increase no findings that would be consistent with NSTEMI  D-dimer sent for screening for risk of PE.  He is actively anticoagulated making him low risk and his symptoms without hypoxia or tachycardia or associated chest pain seems to make PE highly unlikely.   EKG  A inter by me at 1650 heart rate 85 QRS 90 QTc 440 Slight artifact.  Normal sinus rhythm, T wave abnormality including inferior lateral T wave inversion with slight depressions.  No STEMI  His ECG does not appear to have acute morphologic changes compared to previous from August 13   RADIOLOGY  Slight prominence of interstitial markings.  No obvious infiltrate.  No pneumothorax.  Interpreted by me  DG Chest 2 View Result Date: 06/22/2023 CLINICAL DATA:  Chest pain EXAM: CHEST - 2 VIEW COMPARISON:  01/15/2023 FINDINGS: Loop recorder. Stable heart size status post sternotomy, CABG, and TAVR. Increased bibasilar interstitial markings. No pleural effusion or pneumothorax. IMPRESSION: Increased bibasilar interstitial markings, which may reflect bronchitic lung changes. Electronically Signed   By: Duanne Guess D.O.   On: 06/22/2023 18:20      PROCEDURES:  Critical Care performed: No  Procedures   MEDICATIONS ORDERED IN ED: Medications  ipratropium-albuterol (DUONEB) 0.5-2.5 (3) MG/3ML nebulizer solution 3 mL (3 mLs Nebulization Given 06/23/23 0118)  benzonatate (TESSALON) capsule 100 mg (100 mg Oral Given 06/23/23 0118)     IMPRESSION / MDM / ASSESSMENT AND PLAN / ED COURSE  I reviewed the triage vital signs and the nursing notes.                              Differential diagnosis includes, but is not limited to, viral syndrome, bronchitis, less likely findings such as pneumonia, thromboembolism, very unlikely to be ACS without associated chest pain, no clear evidence by clinical exam of  volume overload or CHF, no oxygen requirement normal work of breathing etc.    Patient's presentation is most consistent with acute complicated illness / injury requiring diagnostic workup.      Clinical Course as of 06/23/23 0336  Sat Jun 23, 2023  0234 Age-adjusted D-dimer is negative.  Given the patient is also positive for RSV suspect his symptoms cough congestion related to RSV virus. [MQ]  0234 He is currently anticoagulated. [  MQ]  0235 His troponin has minimally increased, at this point I am comfortable discharging the patient I suspect he is likely having very slightly increased cardiac demand secondary to RSV.  He is not having active chest pain [MQ]    Clinical Course User Index [MQ] Sharyn Creamer, MD   ----------------------------------------- 3:35 AM on 06/23/2023 ----------------------------------------- Vitals:   06/23/23 0100 06/23/23 0230  BP: (!) 147/88 134/60  Pulse: (!) 114 94  Resp: (!) 26 16  Temp:    SpO2: 97% 94%    All vital signs normal.  Patient resting, asleep on room entry at this time with normal work of breathing and oxygen saturation.  He alerts easily to voice, discussed diagnosis of RSV, conservative treatment, will provide Tessalon which has been helpful with the cough here, and discussed careful return precautions around dyspnea chest pain, or if other concerns arise to return to the ER.  Return precautions and treatment recommendations and follow-up discussed with the patient who is agreeable with the plan.   FINAL CLINICAL IMPRESSION(S) / ED DIAGNOSES   Final diagnoses:  RSV bronchitis     Rx / DC Orders   ED Discharge Orders          Ordered    benzonatate (TESSALON PERLES) 100 MG capsule  3 times daily PRN        06/23/23 0333             Note:  This document was prepared using Dragon voice recognition software and may include unintentional dictation errors.   Sharyn Creamer, MD 06/23/23 [phone]

## 2023-06-23 NOTE — ED Notes (Signed)
Pt ambulated to and from restroom, NADN

## 2023-06-25 DIAGNOSIS — I4891 Unspecified atrial fibrillation: Secondary | ICD-10-CM | POA: Diagnosis not present

## 2023-06-25 DIAGNOSIS — B974 Respiratory syncytial virus as the cause of diseases classified elsewhere: Secondary | ICD-10-CM | POA: Diagnosis not present

## 2023-06-25 DIAGNOSIS — I5042 Chronic combined systolic (congestive) and diastolic (congestive) heart failure: Secondary | ICD-10-CM | POA: Diagnosis not present

## 2023-07-02 ENCOUNTER — Ambulatory Visit (INDEPENDENT_AMBULATORY_CARE_PROVIDER_SITE_OTHER): Payer: Medicare PPO

## 2023-07-02 DIAGNOSIS — R55 Syncope and collapse: Secondary | ICD-10-CM

## 2023-07-02 LAB — CUP PACEART REMOTE DEVICE CHECK
Date Time Interrogation Session: 20250126230018
Implantable Pulse Generator Implant Date: 20210503

## 2023-07-03 NOTE — Progress Notes (Signed)
[facility] Summary Report / Loop Recorder

## 2023-07-11 DIAGNOSIS — I779 Disorder of arteries and arterioles, unspecified: Secondary | ICD-10-CM | POA: Diagnosis not present

## 2023-07-11 DIAGNOSIS — R569 Unspecified convulsions: Secondary | ICD-10-CM | POA: Diagnosis not present

## 2023-07-11 DIAGNOSIS — I2510 Atherosclerotic heart disease of native coronary artery without angina pectoris: Secondary | ICD-10-CM | POA: Diagnosis not present

## 2023-07-11 DIAGNOSIS — I4891 Unspecified atrial fibrillation: Secondary | ICD-10-CM | POA: Diagnosis not present

## 2023-07-11 DIAGNOSIS — Z87891 Personal history of nicotine dependence: Secondary | ICD-10-CM | POA: Diagnosis not present

## 2023-07-11 DIAGNOSIS — E1122 Type 2 diabetes mellitus with diabetic chronic kidney disease: Secondary | ICD-10-CM | POA: Diagnosis not present

## 2023-07-11 DIAGNOSIS — I129 Hypertensive chronic kidney disease with stage 1 through stage 4 chronic kidney disease, or unspecified chronic kidney disease: Secondary | ICD-10-CM | POA: Diagnosis not present

## 2023-07-11 DIAGNOSIS — N183 Chronic kidney disease, stage 3 unspecified: Secondary | ICD-10-CM | POA: Diagnosis not present

## 2023-07-20 DIAGNOSIS — N1831 Chronic kidney disease, stage 3a: Secondary | ICD-10-CM | POA: Diagnosis not present

## 2023-07-20 DIAGNOSIS — I10 Essential (primary) hypertension: Secondary | ICD-10-CM | POA: Diagnosis not present

## 2023-07-20 DIAGNOSIS — I2510 Atherosclerotic heart disease of native coronary artery without angina pectoris: Secondary | ICD-10-CM | POA: Diagnosis not present

## 2023-07-20 DIAGNOSIS — E7800 Pure hypercholesterolemia, unspecified: Secondary | ICD-10-CM | POA: Diagnosis not present

## 2023-07-20 DIAGNOSIS — E1122 Type 2 diabetes mellitus with diabetic chronic kidney disease: Secondary | ICD-10-CM | POA: Diagnosis not present

## 2023-08-03 ENCOUNTER — Other Ambulatory Visit: Payer: Self-pay

## 2023-08-03 MED ORDER — NITROGLYCERIN 0.4 MG SL SUBL: 0.4000 mg | SUBLINGUAL_TABLET | SUBLINGUAL | 2 refills | Status: AC | PRN

## 2023-08-04 ENCOUNTER — Observation Stay

## 2023-08-04 ENCOUNTER — Other Ambulatory Visit: Payer: Self-pay

## 2023-08-04 ENCOUNTER — Observation Stay
Admission: EM | Admit: 2023-08-04 | Discharge: 2023-08-06 | Disposition: A | Discharge status: Home / Self Care | Attending: Internal Medicine | Admitting: Internal Medicine

## 2023-08-04 DIAGNOSIS — Z8673 Personal history of transient ischemic attack (TIA), and cerebral infarction without residual deficits: Secondary | ICD-10-CM

## 2023-08-04 DIAGNOSIS — E1122 Type 2 diabetes mellitus with diabetic chronic kidney disease: Secondary | ICD-10-CM | POA: Diagnosis not present

## 2023-08-04 DIAGNOSIS — Z7984 Long term (current) use of oral hypoglycemic drugs: Secondary | ICD-10-CM | POA: Diagnosis not present

## 2023-08-04 DIAGNOSIS — I5022 Chronic systolic (congestive) heart failure: Secondary | ICD-10-CM | POA: Diagnosis present

## 2023-08-04 DIAGNOSIS — Z7902 Long term (current) use of antithrombotics/antiplatelets: Secondary | ICD-10-CM | POA: Insufficient documentation

## 2023-08-04 DIAGNOSIS — I10 Essential (primary) hypertension: Secondary | ICD-10-CM | POA: Diagnosis present

## 2023-08-04 DIAGNOSIS — Z87898 Personal history of other specified conditions: Secondary | ICD-10-CM

## 2023-08-04 DIAGNOSIS — E7800 Pure hypercholesterolemia, unspecified: Secondary | ICD-10-CM | POA: Diagnosis present

## 2023-08-04 DIAGNOSIS — G40909 Epilepsy, unspecified, not intractable, without status epilepticus: Secondary | ICD-10-CM | POA: Diagnosis not present

## 2023-08-04 DIAGNOSIS — Z951 Presence of aortocoronary bypass graft: Secondary | ICD-10-CM | POA: Diagnosis not present

## 2023-08-04 DIAGNOSIS — Z79899 Other long term (current) drug therapy: Secondary | ICD-10-CM | POA: Insufficient documentation

## 2023-08-04 DIAGNOSIS — J449 Chronic obstructive pulmonary disease, unspecified: Secondary | ICD-10-CM | POA: Insufficient documentation

## 2023-08-04 DIAGNOSIS — N183 Chronic kidney disease, stage 3 unspecified: Secondary | ICD-10-CM | POA: Diagnosis present

## 2023-08-04 DIAGNOSIS — I130 Hypertensive heart and chronic kidney disease with heart failure and stage 1 through stage 4 chronic kidney disease, or unspecified chronic kidney disease: Secondary | ICD-10-CM | POA: Insufficient documentation

## 2023-08-04 DIAGNOSIS — Z7901 Long term (current) use of anticoagulants: Secondary | ICD-10-CM | POA: Diagnosis not present

## 2023-08-04 DIAGNOSIS — Z952 Presence of prosthetic heart valve: Secondary | ICD-10-CM | POA: Diagnosis not present

## 2023-08-04 DIAGNOSIS — I480 Paroxysmal atrial fibrillation: Secondary | ICD-10-CM | POA: Diagnosis present

## 2023-08-04 DIAGNOSIS — M79602 Pain in left arm: Secondary | ICD-10-CM | POA: Insufficient documentation

## 2023-08-04 DIAGNOSIS — I2510 Atherosclerotic heart disease of native coronary artery without angina pectoris: Secondary | ICD-10-CM | POA: Insufficient documentation

## 2023-08-04 DIAGNOSIS — Z87891 Personal history of nicotine dependence: Secondary | ICD-10-CM | POA: Diagnosis not present

## 2023-08-04 DIAGNOSIS — E119 Type 2 diabetes mellitus without complications: Secondary | ICD-10-CM | POA: Diagnosis present

## 2023-08-04 DIAGNOSIS — I214 Non-ST elevation (NSTEMI) myocardial infarction: Principal | ICD-10-CM

## 2023-08-04 DIAGNOSIS — I5042 Chronic combined systolic (congestive) and diastolic (congestive) heart failure: Secondary | ICD-10-CM | POA: Insufficient documentation

## 2023-08-04 DIAGNOSIS — I249 Acute ischemic heart disease, unspecified: Principal | ICD-10-CM | POA: Diagnosis present

## 2023-08-04 DIAGNOSIS — R079 Chest pain, unspecified: Secondary | ICD-10-CM | POA: Diagnosis present

## 2023-08-04 DIAGNOSIS — N1831 Chronic kidney disease, stage 3a: Secondary | ICD-10-CM | POA: Diagnosis not present

## 2023-08-04 DIAGNOSIS — E785 Hyperlipidemia, unspecified: Secondary | ICD-10-CM | POA: Diagnosis not present

## 2023-08-04 LAB — CBC WITH DIFFERENTIAL/PLATELET
Abs Immature Granulocytes: 0.02 10*3/uL (ref 0.00–0.07)
Basophils Absolute: 0 10*3/uL (ref 0.0–0.1)
Basophils Relative: 0 %
Eosinophils Absolute: 0.1 10*3/uL (ref 0.0–0.5)
Eosinophils Relative: 1 %
HCT: 38.4 % — ABNORMAL LOW (ref 39.0–52.0)
Hemoglobin: 12 g/dL — ABNORMAL LOW (ref 13.0–17.0)
Immature Granulocytes: 0 %
Lymphocytes Relative: 9 %
Lymphs Abs: 0.5 10*3/uL — ABNORMAL LOW (ref 0.7–4.0)
MCH: 27.4 pg (ref 26.0–34.0)
MCHC: 31.3 g/dL (ref 30.0–36.0)
MCV: 87.7 fL (ref 80.0–100.0)
Monocytes Absolute: 0.3 10*3/uL (ref 0.1–1.0)
Monocytes Relative: 6 %
Neutro Abs: 4.7 10*3/uL (ref 1.7–7.7)
Neutrophils Relative %: 84 %
Platelets: 132 10*3/uL — ABNORMAL LOW (ref 150–400)
RBC: 4.38 MIL/uL (ref 4.22–5.81)
RDW: 16.6 % — ABNORMAL HIGH (ref 11.5–15.5)
WBC: 5.6 10*3/uL (ref 4.0–10.5)
nRBC: 0 % (ref 0.0–0.2)

## 2023-08-04 LAB — BASIC METABOLIC PANEL
Anion gap: 11 (ref 5–15)
BUN: 27 mg/dL — ABNORMAL HIGH (ref 8–23)
CO2: 25 mmol/L (ref 22–32)
Calcium: 8.8 mg/dL — ABNORMAL LOW (ref 8.9–10.3)
Chloride: 103 mmol/L (ref 98–111)
Creatinine, Ser: 1.51 mg/dL — ABNORMAL HIGH (ref 0.61–1.24)
GFR, Estimated: 50 mL/min — ABNORMAL LOW (ref >60)
Glucose, Bld: 150 mg/dL — ABNORMAL HIGH (ref 70–99)
Potassium: 4 mmol/L (ref 3.5–5.1)
Sodium: 139 mmol/L (ref 135–145)

## 2023-08-04 LAB — TROPONIN I (HIGH SENSITIVITY)
Troponin I (High Sensitivity): 155 ng/L (ref <18)
Troponin I (High Sensitivity): 215 ng/L (ref <18)
Troponin I (High Sensitivity): 248 ng/L (ref <18)

## 2023-08-04 LAB — HEPARIN LEVEL (UNFRACTIONATED): Heparin Unfractionated: >1.1 [IU]/mL — ABNORMAL HIGH (ref 0.30–0.70)

## 2023-08-04 LAB — PROTIME-INR
INR: 1.3 — ABNORMAL HIGH (ref 0.8–1.2)
Prothrombin Time: 16.6 s — ABNORMAL HIGH (ref 11.4–15.2)

## 2023-08-04 LAB — APTT: aPTT: 35 s (ref 24–36)

## 2023-08-04 MED ORDER — ATORVASTATIN CALCIUM 80 MG PO TABS
80.0000 mg | ORAL_TABLET | Freq: Every day | ORAL | Status: DC
  Administered 2023-08-04 – 2023-08-05 (×2): 80 mg via ORAL
  Filled 2023-08-04: qty 1
  Filled 2023-08-04: qty 4

## 2023-08-04 MED ORDER — SODIUM CHLORIDE 0.9% FLUSH
3.0000 mL | Freq: Two times a day (BID) | INTRAVENOUS | Status: DC
  Administered 2023-08-04 – 2023-08-05 (×3): 3 mL via INTRAVENOUS

## 2023-08-04 MED ORDER — INSULIN ASPART 100 UNIT/ML IJ SOLN
0.0000 [IU] | Freq: Three times a day (TID) | INTRAMUSCULAR | Status: DC
  Administered 2023-08-05: 1 [IU] via SUBCUTANEOUS
  Filled 2023-08-04 (×2): qty 1

## 2023-08-04 MED ORDER — SODIUM CHLORIDE 0.9 % IV SOLN: 250.0000 mL | INTRAVENOUS | Status: AC | PRN

## 2023-08-04 MED ORDER — CLOPIDOGREL BISULFATE 75 MG PO TABS
75.0000 mg | ORAL_TABLET | Freq: Every day | ORAL | Status: DC
  Administered 2023-08-05 – 2023-08-06 (×2): 75 mg via ORAL
  Filled 2023-08-04 (×2): qty 1

## 2023-08-04 MED ORDER — FUROSEMIDE 20 MG PO TABS
20.0000 mg | ORAL_TABLET | Freq: Every day | ORAL | Status: DC
  Administered 2023-08-05 – 2023-08-06 (×2): 20 mg via ORAL
  Filled 2023-08-04 (×2): qty 1

## 2023-08-04 MED ORDER — PANTOPRAZOLE SODIUM 40 MG PO TBEC
40.0000 mg | DELAYED_RELEASE_TABLET | Freq: Every day | ORAL | Status: DC
  Administered 2023-08-05 – 2023-08-06 (×2): 40 mg via ORAL
  Filled 2023-08-04 (×2): qty 1

## 2023-08-04 MED ORDER — NITROGLYCERIN 0.4 MG SL SUBL
0.4000 mg | SUBLINGUAL_TABLET | SUBLINGUAL | Status: DC | PRN
Start: 2023-08-04 — End: 2023-08-06

## 2023-08-04 MED ORDER — HEPARIN (PORCINE) 25000 UT/250ML-% IV SOLN
1000.0000 [IU]/h | INTRAVENOUS | Status: DC
  Administered 2023-08-04: 900 [IU]/h via INTRAVENOUS
  Filled 2023-08-04: qty 250

## 2023-08-04 MED ORDER — ONDANSETRON HCL 4 MG/2ML IJ SOLN: 4.0000 mg | Freq: Four times a day (QID) | INTRAMUSCULAR | Status: DC | PRN

## 2023-08-04 MED ORDER — DIVALPROEX SODIUM ER 250 MG PO TB24
250.0000 mg | ORAL_TABLET | Freq: Every day | ORAL | Status: DC
Start: 2023-08-04 — End: 2023-08-06
  Administered 2023-08-04 – 2023-08-05 (×2): 250 mg via ORAL
  Filled 2023-08-04 (×2): qty 1

## 2023-08-04 MED ORDER — AMIODARONE HCL 200 MG PO TABS
200.0000 mg | ORAL_TABLET | Freq: Every day | ORAL | Status: DC
  Administered 2023-08-05 – 2023-08-06 (×2): 200 mg via ORAL
  Filled 2023-08-04 (×2): qty 1

## 2023-08-04 MED ORDER — ACETAMINOPHEN 325 MG PO TABS: 650.0000 mg | ORAL_TABLET | ORAL | Status: DC | PRN

## 2023-08-04 MED ORDER — SODIUM CHLORIDE 0.9% FLUSH: 3.0000 mL | INTRAVENOUS | Status: DC | PRN

## 2023-08-04 MED ORDER — ISOSORBIDE MONONITRATE ER 30 MG PO TB24
15.0000 mg | ORAL_TABLET | Freq: Every day | ORAL | Status: DC
  Administered 2023-08-05 – 2023-08-06 (×2): 15 mg via ORAL
  Filled 2023-08-04 (×2): qty 1

## 2023-08-04 MED ORDER — METOPROLOL SUCCINATE ER 25 MG PO TB24
12.5000 mg | ORAL_TABLET | Freq: Every day | ORAL | Status: DC
  Administered 2023-08-05 – 2023-08-06 (×2): 12.5 mg via ORAL
  Filled 2023-08-04 (×2): qty 1

## 2023-08-04 NOTE — Consult Note (Addendum)
 PHARMACY - ANTICOAGULATION CONSULT NOTE  Pharmacy Consult for Heparin infusion Indication: chest pain/ACS  No Known Allergies  Patient Measurements: Height: 5\' 2"  (157.5 cm) Weight: 60.8 kg (134 lb) IBW/kg (Calculated) : 54.6 Heparin Dosing Weight: 60.8 kg  Vital Signs: Temp: 98.1 F (36.7 C) (03/01 1800) Temp Source: Oral (03/01 1800) BP: 125/60 (03/01 1345) Pulse Rate: 90 (03/01 1745)  Labs: Recent Labs    08/04/23 1357 08/04/23 1617 08/04/23 1724  HGB 12.0*  --   --   HCT 38.4*  --   --   PLT 132*  --   --   APTT  --   --  35  LABPROT  --   --  16.6*  INR  --   --  1.3*  HEPARINUNFRC  --   --  >1.10*  CREATININE 1.51*  --   --   TROPONINIHS 155* 215*  --     Estimated Creatinine Clearance: 36.2 mL/min (A) (by C-G formula based on SCr of 1.51 mg/dL (H)).   Medical History: Past Medical History:  Diagnosis Date   CAD (coronary artery disease) of artery bypass graft    s/p CABG x 4 in 1997   CHF (congestive heart failure) (HCC)    COPD (chronic obstructive pulmonary disease) (HCC)    Coronary artery disease    CVA (cerebral vascular accident) (HCC)    Diabetes mellitus without complication (HCC)    Dysrhythmia    GERD (gastroesophageal reflux disease)    HLD (hyperlipidemia)    Hypertension    S/P TAVR (transcatheter aortic valve replacement) 07/05/2021   with Edwards 26mm S3UR via TF approach with Dr. Excell Seltzer and Dr. Laneta Simmers   Seizures [facility])     Medications:  Apixaban PTA - last dose 3/1 @ 8am per patient's report.  Assessment: 68yo with hitory of CAD status post CABG in 1997, CHF, COPD, diabetes, gastric reflux, hypertension, hyperlipidemia, aortic valve replacement on Eliquis twice daily presents to ED with chest pain. Troponin elevated at 155 > 215.  Pharmacy consulted to initiate and manage heparin infusion for ACS.  Goal of Therapy:  Heparin level 0.3-0.7 units/ml aPTT 66-102 seconds Monitor platelets by anticoagulation protocol: Yes   Plan:  NO Heparin bolus, HL elevated at baseline Start heparin infusion at 900 units/hr Check apTT  in 6 hour and use aPTT to monitor heparin infusion until correlating with HL. Plan to repeat HL with AM labs and daily while on heparin Continue to monitor H&H and platelets  Donjuan Robison Rodriguez-Guzman PharmD, BCPS 08/04/2023 6:38 PM

## 2023-08-04 NOTE — H&P (Signed)
 History and Physical    Patient: Thomas Moore GNF:[id number] DOB: 04-07-1956 DOA: 08/04/2023 DOS: the patient was seen and examined on 08/04/2023 PCP: Lauro Regulus, MD  Patient coming from: Home - lives in ILF apartment; NOK: [place], [phone]   Chief Complaint: CP/SOB  HPI: Thomas Moore is a 68 y.o. male with medical history significant of CAD s/p CABG (1997), chronic diastolic CHF, COPD, CVA, DM, HTN, HLD, s/p TAVR, and seizure d/o who presented from [facility] ILF on 3/1 with L arm pain.  He reports that with all of his prior CAD events, he had left arm pain.  Today was similar in that he developed acute L arm pain during exertion (walking back and forth to check on a friend).  He also had similar pain last week and ran out of NTG earlier this week.  He is a poor historian.  He is unsure about chest discomfort, maybe a little.  He is on Eliquis and took his usual dose this AM.    ER Course:  68yo with significant cardiac history, here with L arm pain and chest tightness.  Similar to index symptoms.  Last cath 07/2022 with diffuse disease.  EKG ok.  Troponin 155 -> 215.  Started on Heparin.       Review of Systems: As mentioned in the history of present illness. All other systems reviewed and are negative. Past Medical History:  Diagnosis Date   CAD (coronary artery disease) of artery bypass graft    s/p CABG x 4 in 1997   CHF (congestive heart failure) (HCC)    COPD (chronic obstructive pulmonary disease) (HCC)    Coronary artery disease    CVA (cerebral vascular accident) (HCC)    Diabetes mellitus without complication (HCC)    Dysrhythmia    GERD (gastroesophageal reflux disease)    HLD (hyperlipidemia)    Hypertension    S/P TAVR (transcatheter aortic valve replacement) 07/05/2021   with Edwards 26mm S3UR via TF approach with Dr. Excell Seltzer and Dr. Laneta Simmers   Seizures [facility])    Past Surgical History:  Procedure Laterality Date   BYPASS GRAFT  ANGIOGRAPHY N/A 04/25/2021   Procedure: BYPASS GRAFT ANGIOGRAPHY;  Surgeon: Armando Reichert, MD;  Location: [facility] INVASIVE CV LAB;  Service: Cardiovascular;  Laterality: N/A;   CARDIAC SURGERY     CORNEAL TRANSPLANT Right    CORONARY ARTERY BYPASS GRAFT  1997   ELECTROPHYSIOLOGY STUDY N/A 09/08/2021   Procedure: ELECTROPHYSIOLOGY STUDY;  Surgeon: Lanier Prude, MD;  Location: [facility] INVASIVE CV LAB;  Service: Cardiovascular;  Laterality: N/A;   EYE SURGERY     FLEXOR TENDON REPAIR Left 07/11/2019   Procedure: FLEXOR tenolysis  REPAIR LEFT RING FINGER with tednon repair;  Surgeon: Kennedy Bucker, MD;  Location: [facility] ORS;  Service: Orthopedics;  Laterality: Left;   INCISION AND DRAINAGE ABSCESS Left 05/08/2019   Procedure: INCISION AND DRAINAGE ABSCESS;  Surgeon: Donato Heinz, MD;  Location: [facility] ORS;  Service: Orthopedics;  Laterality: Left;   INTRAOPERATIVE TRANSTHORACIC ECHOCARDIOGRAM N/A 07/05/2021   Procedure: INTRAOPERATIVE TRANSTHORACIC ECHOCARDIOGRAM;  Surgeon: Tonny Bollman, MD;  Location: [facility] OR;  Service: Open Heart Surgery;  Laterality: N/A;   LEFT HEART CATH AND CORONARY ANGIOGRAPHY Left 06/09/2022   Procedure: LEFT HEART CATH AND CORONARY ANGIOGRAPHY;  Surgeon: Laurier Nancy, MD;  Location: [facility] INVASIVE CV LAB;  Service: Cardiovascular;  Laterality: Left;   LEFT HEART CATH AND CORS/GRAFTS ANGIOGRAPHY N/A 10/02/2019   Procedure: LEFT HEART  CATH AND CORS/GRAFTS ANGIOGRAPHY;  Surgeon: Marykay Lex, MD;  Location: [facility] INVASIVE CV LAB;  Service: Cardiovascular;  Laterality: N/A;   LEFT HEART CATH AND CORS/GRAFTS ANGIOGRAPHY N/A 07/17/2022   Procedure: LEFT HEART CATH AND CORS/GRAFTS ANGIOGRAPHY and possible PCI and stent;  Surgeon: Laurier Nancy, MD;  Location: [facility] INVASIVE CV LAB;  Service: Cardiovascular;  Laterality: N/A;   LOOP RECORDER INSERTION N/A 10/06/2019   Procedure: LOOP RECORDER INSERTION;  Surgeon: Marinus Maw, MD;  Location: [facility] INVASIVE CV LAB;  Service:  Cardiovascular;  Laterality: N/A;   TRANSCATHETER AORTIC VALVE REPLACEMENT, TRANSFEMORAL N/A 07/05/2021   Procedure: TRANSCATHETER AORTIC VALVE REPLACEMENT, TRANSFEMORAL;  Surgeon: Tonny Bollman, MD;  Location: [facility] OR;  Service: Open Heart Surgery;  Laterality: N/A;   TRIGGER FINGER RELEASE Left    Social History:  reports that he quit smoking about 41 years ago. His smoking use included cigarettes. He has never used smokeless tobacco. He reports that he does not currently use alcohol. He reports that he does not use drugs.  No Known Allergies  Family History  Problem Relation Age of Onset   Seizures Brother     Prior to Admission medications   Medication Sig Start Date End Date Taking? Authorizing Provider  acetaminophen (TYLENOL) 650 MG CR tablet Take 1,300 mg by mouth every 8 (eight) hours as needed for pain.    [provider]  amiodarone (PACERONE) 200 MG tablet Take 1 tablet (200 mg total) by mouth daily. 03/05/23   Laurier Nancy, MD  apixaban (ELIQUIS) 5 MG TABS tablet Take 1 tablet (5 mg total) by mouth 2 (two) times daily. 11/14/22   Sherie Don, NP  atorvastatin (LIPITOR) 80 MG tablet Take 1 tablet (80 mg total) by mouth at bedtime. 08/11/22   Laurier Nancy, MD  benzonatate (TESSALON PERLES) 100 MG capsule Take 1 capsule (100 mg total) by mouth 3 (three) times daily as needed for cough. 06/23/23   Sharyn Creamer, MD  Calcium Citrate-Vitamin D (CALCIUM + D PO) Take 1 tablet by mouth 2 (two) times daily.    [provider]  clopidogrel (PLAVIX) 75 MG tablet Take 1 tablet (75 mg total) by mouth daily. 10/27/22 10/27/23  Laurier Nancy, MD  dapagliflozin propanediol (FARXIGA) 10 MG TABS tablet Take 1 tablet (10 mg total) by mouth daily before breakfast. 06/28/22   Delma Freeze, FNP  divalproex (DEPAKOTE ER) 250 MG 24 hr tablet Take 1 tablet (250 mg total) by mouth at bedtime. 10/04/22   Enedina Finner, MD  Dulaglutide 3 MG/0.5ML SOPN Inject 3 mg into the skin once a week.  09/11/22   [provider]  furosemide (LASIX) 20 MG tablet Take 1 tablet (20 mg total) by mouth daily. 05/23/23   Laurier Nancy, MD  isosorbide mononitrate (IMDUR) 30 MG 24 hr tablet Take 1/2 tab dialy 01/25/23   Laurier Nancy, MD  JARDIANCE 25 MG TABS tablet Take 25 mg by mouth daily.    [provider]  metoprolol succinate (TOPROL-XL) 25 MG 24 hr tablet Take 0.5 tablets (12.5 mg total) by mouth daily. 10/27/22   Laurier Nancy, MD  Multiple Vitamin (MULTIVITAMIN) capsule Take 1 capsule by mouth daily.    [provider]  nitroGLYCERIN (NITROSTAT) 0.4 MG SL tablet Place 1 tablet (0.4 mg total) under the tongue every 5 (five) minutes as needed for chest pain. 08/03/23   Scoggins, Amber, NP  pantoprazole (PROTONIX) 40 MG tablet Take 40 mg by  mouth daily.    [provider]    Physical Exam: Vitals:   08/04/23 1345 08/04/23 1347 08/04/23 1745 08/04/23 1800  BP: 125/60     Pulse: (!) 102  90   Resp: 17  17   Temp: 97.7 F (36.5 C)   98.1 F (36.7 C)  TempSrc: Oral   Oral  SpO2: 98%  90%   Weight:  60.8 kg    Height:  5\' 2"  (1.575 m)     General:  Appears calm and comfortable and is in NAD, very conversant Eyes:  EOMI, mild conjunctival injection with lid lag ENT:  grossly normal hearing, lips & tongue, mmm; poor dentition Neck:  no LAD, masses or thyromegaly Cardiovascular:  RRR, no m/r/g. No LE edema.  Respiratory:   CTA bilaterally with no wheezes/rales/rhonchi.  Normal respiratory effort. Abdomen:  soft, NT, ND Skin:  no rash or induration seen on limited exam Musculoskeletal:  grossly normal tone BUE/BLE, good ROM, no bony abnormality Psychiatric:  blunted mood and affect, speech very fluent and somewhat appropriate, fairly tangential Neurologic:  CN 2-12 grossly intact, moves all extremities in coordinated fashion   Radiological Exams on Admission: Independently reviewed - see discussion in A/P where applicable  No results found.  EKG:  Independently reviewed.  NSR with rate 91; no evidence of acute ischemia   Labs on Admission: I have personally reviewed the available labs and imaging studies at the time of the admission.  Pertinent labs:    Glucose 150 BUN 27/Creatinine 1.51/GFR 50 - stable HS troponin 155 -> 215 WBC 5.6 Hgb 12 Platelets 132   Assessment and Plan: Principal Problem:   ACS (acute coronary syndrome) (HCC) Active Problems:   Chronic combined systolic and diastolic congestive heart failure (HCC)   Controlled type 2 diabetes mellitus with stage 3 chronic kidney disease, without long-term current use of insulin (HCC)   Essential hypertension   History of stroke   History of seizure   Pure hypercholesterolemia   S/P TAVR (transcatheter aortic valve replacement)   Paroxysmal atrial fibrillation (HCC)    ACS Patient with significant cardiac history who presented with index symptoms of L arm pain that came on acutely with exertion He reports similar pain earlier in the week, resolved with NTG but he ran out of NTG CXR pending Initial HS troponin elevated; repeat with positive delta - concerning for NSTEMI EKG with no apparent STEMI Will plan to observe on telemetry to further evaluate for ACS Risk factor stratification with HgbA1c and FLP Cardiology consultation requested NTG for symptom relief (although there is no mortality benefit) Needs beta blocker (PO, but only if not in HF or at risk for shock) EDP started Heparin drip  Continue Plavix, Imdur Check echo  HTN Continue Toprol XL BP currently controlled at 125/60  HLD Continue Lipitor Check lipids  DM Last A1c was 9.5 in 04/2023, poor control; will recheck Hold Farxiga, Dulaglutide, Jardiance Will cover with sensitive-scale SSI for now  Chronic combinedCHF Echo in 02/2021 with EF 50-55% and moderate AS Appears compensated Hold Farxiga Continue PO Lasix Check Echo  COPD No home meds Remote smoking history  Afib Rate  controlled with amiodarone On Eliquis, although this is on hold since Heparin was started  Seizure d/o Continue Depakote  Stage 3a CKD Appears stable Avoid nephrotoxic medications     Advance Care Planning:   Code Status: Full Code - /Code status was discussed with the patient and/or family at the time of admission.  The patient would want to receive full resuscitative measures at this time.   Consults: Cardiology  DVT Prophylaxis: Heparin drip  Family Communication: None present; I called his friend at the time of admission  Severity of Illness: The appropriate patient status for this patient is OBSERVATION. Observation status is judged to be reasonable and necessary in order to provide the required intensity of service to ensure the patient's safety. The patient's presenting symptoms, physical exam findings, and initial radiographic and laboratory data in the context of their medical condition is felt to place them at decreased risk for further clinical deterioration. Furthermore, it is anticipated that the patient will be medically stable for discharge from the hospital within 2 midnights of admission.   Author: Jonah Blue, MD 08/04/2023 6:13 PM  For on call review [email or web address].

## 2023-08-04 NOTE — ED Notes (Signed)
 Pt O2 sats noted to be 88-93 while sleeping.  2LNC added.

## 2023-08-04 NOTE — ED Triage Notes (Signed)
 Arrived by [facility] from [employer] c/o chest tightness. Patient reports he has not had seizure meds in 1 week due to running out   History strokes, seizure, MI  Takes eliquis  EMS vitals: 158/95b/p 95% RA 104HR 160CBG 97.9oral  324 aspirin - Given by EMS

## 2023-08-04 NOTE — ED Notes (Signed)
 Pt ambulated to bathroom 1 assist, changed into hospital gown with assistance. Tolerated well.

## 2023-08-04 NOTE — ED Triage Notes (Signed)
 Pt c/o left arm pain x several days. Pt denies any recent falls, denies any injuries or trauma.

## 2023-08-04 NOTE — ED Notes (Signed)
 Pt ate dinner tray independently, trash disposed.

## 2023-08-04 NOTE — ED Provider Notes (Signed)
 [facility] Provider Note    Event Date/Time   First MD Initiated Contact with Patient 08/04/23 1604     (approximate)  History   Chief Complaint: Chest pain, left arm pain  HPI  Thomas Moore is a 68 y.o. male with a past medical history of CAD status post CABG in 1997, CHF, COPD, diabetes, gastric reflux, hypertension, hyperlipidemia, aortic valve replacement on Eliquis twice daily who presents to the emergency department for chest pain and left arm pain.  According to the patient for the last several weeks he has been experiencing intermittent chest pain and left arm pain.  Patient states his symptoms were worse today he is out of nitroglycerin and was having symptoms so he called EMS to bring him to the emergency department.  Patient states a history of multiple heart attacks in the past.  I reviewed the patient's chart he last had a cardiac catheterization in February of last year showing significant diffuse disease.  Patient states left arm pain as his only symptom with his prior heart attacks.  Physical Exam   Triage Vital Signs: ED Triage Vitals  Encounter Vitals Group     BP 08/04/23 1345 125/60     Systolic BP Percentile --      Diastolic BP Percentile --      Pulse Rate 08/04/23 1345 (!) 102     Resp 08/04/23 1345 17     Temp 08/04/23 1345 97.7 F (36.5 C)     Temp Source 08/04/23 1345 Oral     SpO2 08/04/23 1345 98 %     Weight 08/04/23 1347 134 lb (60.8 kg)     Height 08/04/23 1347 5\' 2"  (1.575 m)     Head Circumference --      Peak Flow --      Pain Score 08/04/23 1345 2     Pain Loc --      Pain Education --      Exclude from Growth Chart --     Most recent vital signs: Vitals:   08/04/23 1345  BP: 125/60  Pulse: (!) 102  Resp: 17  Temp: 97.7 F (36.5 C)  SpO2: 98%    General: Awake, no distress.  CV:  Good peripheral perfusion.  Regular rate and rhythm  Resp:  Normal effort.  Equal breath sounds bilaterally.  Abd:  No  distention.  Soft, nontender.  No rebound or guarding.  ED Results / Procedures / Treatments   EKG  EKG viewed and interpreted by myself shows a normal sinus rhythm at 91 bpm with a narrow QRS, normal axis, normal intervals, no concerning ST changes.  MEDICATIONS ORDERED IN ED: Medications - No data to display   IMPRESSION / MDM / ASSESSMENT AND PLAN / ED COURSE  I reviewed the triage vital signs and the nursing notes.  Patient's presentation is most consistent with acute presentation with potential threat to life or bodily function.  Patient presents to the emergency department for left arm pain intermittent over the last few weeks but getting worse now with some chest tightness.  Patient has a history of MI with similar symptoms in the past.  EKG today does not appear to show any ST elevation.  Patient CBC is reassuring, chemistry reassuring.  Patient's troponin is elevated to 155.  In reviewing the patient's chart it appears his baseline troponin is usually around 20-30, thus indicating more significant elevation today.  Given the patient's history of similar symptoms with  prior MIs as well as a catheterization 1 year ago showing significant disease we will dart a heparin infusion and admit to the hospital service for further workup and treatment for likely NSTEMI.  Patient follows up with Dr. Welton Flakes for cardiology.  CRITICAL CARE Performed by: Minna Antis   Total critical care time: 30 minutes  Critical care time was exclusive of separately billable procedures and treating other patients.  Critical care was necessary to treat or prevent imminent or life-threatening deterioration.  Critical care was time spent personally by me on the following activities: development of treatment plan with patient and/or surrogate as well as nursing, discussions with consultants, evaluation of patient's response to treatment, examination of patient, obtaining history from patient or surrogate,  ordering and performing treatments and interventions, ordering and review of laboratory studies, ordering and review of radiographic studies, pulse oximetry and re-evaluation of patient's condition.   FINAL CLINICAL IMPRESSION(S) / ED DIAGNOSES   NSTEMI   Note:  This document was prepared using Dragon voice recognition software and may include unintentional dictation errors.   Minna Antis, MD 08/04/23 [id number]

## 2023-08-05 DIAGNOSIS — I249 Acute ischemic heart disease, unspecified: Secondary | ICD-10-CM | POA: Diagnosis not present

## 2023-08-05 LAB — CBC WITH DIFFERENTIAL/PLATELET
Abs Immature Granulocytes: 0.02 10*3/uL (ref 0.00–0.07)
Basophils Absolute: 0 10*3/uL (ref 0.0–0.1)
Basophils Relative: 1 %
Eosinophils Absolute: 0.1 10*3/uL (ref 0.0–0.5)
Eosinophils Relative: 2 %
HCT: 38.8 % — ABNORMAL LOW (ref 39.0–52.0)
Hemoglobin: 12.1 g/dL — ABNORMAL LOW (ref 13.0–17.0)
Immature Granulocytes: 0 %
Lymphocytes Relative: 10 %
Lymphs Abs: 0.6 10*3/uL — ABNORMAL LOW (ref 0.7–4.0)
MCH: 27.2 pg (ref 26.0–34.0)
MCHC: 31.2 g/dL (ref 30.0–36.0)
MCV: 87.2 fL (ref 80.0–100.0)
Monocytes Absolute: 0.4 10*3/uL (ref 0.1–1.0)
Monocytes Relative: 7 %
Neutro Abs: 4.6 10*3/uL (ref 1.7–7.7)
Neutrophils Relative %: 80 %
Platelets: 125 10*3/uL — ABNORMAL LOW (ref 150–400)
RBC: 4.45 MIL/uL (ref 4.22–5.81)
RDW: 16.6 % — ABNORMAL HIGH (ref 11.5–15.5)
WBC: 5.8 10*3/uL (ref 4.0–10.5)
nRBC: 0 % (ref 0.0–0.2)

## 2023-08-05 LAB — APTT
aPTT: 77 s — ABNORMAL HIGH (ref 24–36)
aPTT: 64 s — ABNORMAL HIGH (ref 24–36)

## 2023-08-05 LAB — TROPONIN I (HIGH SENSITIVITY): Troponin I (High Sensitivity): 166 ng/L (ref <18)

## 2023-08-05 LAB — BASIC METABOLIC PANEL
Anion gap: 10 (ref 5–15)
BUN: 23 mg/dL (ref 8–23)
CO2: 23 mmol/L (ref 22–32)
Calcium: 8.5 mg/dL — ABNORMAL LOW (ref 8.9–10.3)
Chloride: 103 mmol/L (ref 98–111)
Creatinine, Ser: 1.31 mg/dL — ABNORMAL HIGH (ref 0.61–1.24)
GFR, Estimated: 59 mL/min — ABNORMAL LOW (ref >60)
Glucose, Bld: 115 mg/dL — ABNORMAL HIGH (ref 70–99)
Potassium: 3.6 mmol/L (ref 3.5–5.1)
Sodium: 136 mmol/L (ref 135–145)

## 2023-08-05 LAB — GLUCOSE, CAPILLARY
Glucose-Capillary: 112 mg/dL — ABNORMAL HIGH (ref 70–99)
Glucose-Capillary: 256 mg/dL — ABNORMAL HIGH (ref 70–99)

## 2023-08-05 LAB — HEMOGLOBIN A1C
Hgb A1c MFr Bld: 6.8 % — ABNORMAL HIGH (ref 4.8–5.6)
Mean Plasma Glucose: 148.46 mg/dL

## 2023-08-05 LAB — LIPID PANEL
Cholesterol: 196 mg/dL (ref 0–200)
HDL: 42 mg/dL (ref >40)
LDL Cholesterol: 131 mg/dL — ABNORMAL HIGH (ref 0–99)
Total CHOL/HDL Ratio: 4.7 ratio
Triglycerides: 115 mg/dL (ref <150)
VLDL: 23 mg/dL (ref 0–40)

## 2023-08-05 LAB — CBG MONITORING, ED
Glucose-Capillary: 124 mg/dL — ABNORMAL HIGH (ref 70–99)
Glucose-Capillary: 117 mg/dL — ABNORMAL HIGH (ref 70–99)

## 2023-08-05 MED ORDER — HEPARIN BOLUS VIA INFUSION
900.0000 [IU] | Freq: Once | INTRAVENOUS | Status: DC
  Filled 2023-08-05: qty 900

## 2023-08-05 MED ORDER — APIXABAN 5 MG PO TABS
5.0000 mg | ORAL_TABLET | Freq: Two times a day (BID) | ORAL | Status: DC
  Administered 2023-08-05 – 2023-08-06 (×2): 5 mg via ORAL
  Filled 2023-08-05 (×2): qty 1

## 2023-08-05 NOTE — Consult Note (Signed)
 PHARMACY - ANTICOAGULATION CONSULT NOTE  Pharmacy Consult for Heparin infusion Indication: chest pain/ACS  No Known Allergies  Patient Measurements: Height: 5\' 2"  (157.5 cm) Weight: 60.8 kg (134 lb) IBW/kg (Calculated) : 54.6 Heparin Dosing Weight: 60.8 kg  Vital Signs: Temp: 97.7 F (36.5 C) (03/02 0422) Temp Source: Oral (03/02 0422) BP: 138/76 (03/02 0800) Pulse Rate: 87 (03/02 0530)  Labs: Recent Labs    08/04/23 1357 08/04/23 1617 08/04/23 1724 08/04/23 1855 08/05/23 0408 08/05/23 0409 08/05/23 0951  HGB 12.0*  --   --   --   --  12.1*  --   HCT 38.4*  --   --   --   --  38.8*  --   PLT 132*  --   --   --   --  125*  --   APTT  --   --  35  --  77*  --  64*  LABPROT  --   --  16.6*  --   --   --   --   INR  --   --  1.3*  --   --   --   --   HEPARINUNFRC  --   --  >1.10*  --   --   --   --   CREATININE 1.51*  --   --   --   --  1.31*  --   TROPONINIHS 155* 215*  --  248* 166*  --   --     Estimated Creatinine Clearance: 41.7 mL/min (A) (by C-G formula based on SCr of 1.31 mg/dL (H)).   Medical History: Past Medical History:  Diagnosis Date   CAD (coronary artery disease) of artery bypass graft    s/p CABG x 4 in 1997   CHF (congestive heart failure) (HCC)    COPD (chronic obstructive pulmonary disease) (HCC)    Coronary artery disease    CVA (cerebral vascular accident) (HCC)    Diabetes mellitus without complication (HCC)    Dysrhythmia    GERD (gastroesophageal reflux disease)    HLD (hyperlipidemia)    Hypertension    S/P TAVR (transcatheter aortic valve replacement) 07/05/2021   with Edwards 26mm S3UR via TF approach with Dr. Excell Seltzer and Dr. Laneta Simmers   Seizures [facility])     Medications:  Apixaban PTA - last dose 3/1 @ 8am per patient's report.  Assessment: 68yo with hitory of CAD status post CABG in 1997, CHF, COPD, diabetes, gastric reflux, hypertension, hyperlipidemia, aortic valve replacement on Eliquis twice daily presents to ED with chest  pain. Troponin elevated at 155 > 215.  Pharmacy consulted to initiate and manage heparin infusion for ACS. Baseline HL >1.10, aPTT 3.5  INR 1.3  Hgb 12.0  Plt 132 ( Baseline HL elevated d/t apixaban)  3/2 0408  aPTT 77 Therapeutic x 1 3/2 0951  aPTT 64 Subtherapeutic, increase 900 > 1000 units/hr   Goal of Therapy:  Heparin level 0.3-0.7 units/ml aPTT 66-102 seconds Monitor platelets by anticoagulation protocol: Yes   Plan:   Will order heparin bolus of 900 units x 1 and increase heparin infusion to 1000 units/hr Check aPTT  in 6 hour after rate change and use aPTT to monitor heparin infusion until correlating with HL. Plan to repeat HL with AM labs and daily while on heparin Continue to monitor H&H and platelets  Bari Mantis PharmD Clinical Pharmacist 08/05/2023

## 2023-08-05 NOTE — Consult Note (Signed)
 PHARMACY - ANTICOAGULATION CONSULT NOTE  Pharmacy Consult for Heparin infusion Indication: chest pain/ACS  No Known Allergies  Patient Measurements: Height: 5\' 2"  (157.5 cm) Weight: 60.8 kg (134 lb) IBW/kg (Calculated) : 54.6 Heparin Dosing Weight: 60.8 kg  Vital Signs: Temp: 97.7 F (36.5 C) (03/02 0422) Temp Source: Oral (03/02 0422) BP: 137/69 (03/02 0700) Pulse Rate: 87 (03/02 0530)  Labs: Recent Labs    08/04/23 1357 08/04/23 1617 08/04/23 1724 08/04/23 1855 08/05/23 0408 08/05/23 0409  HGB 12.0*  --   --   --   --  12.1*  HCT 38.4*  --   --   --   --  38.8*  PLT 132*  --   --   --   --  125*  APTT  --   --  35  --  77*  --   LABPROT  --   --  16.6*  --   --   --   INR  --   --  1.3*  --   --   --   HEPARINUNFRC  --   --  >1.10*  --   --   --   CREATININE 1.51*  --   --   --   --  1.31*  TROPONINIHS 155* 215*  --  248* 166*  --     Estimated Creatinine Clearance: 41.7 mL/min (A) (by C-G formula based on SCr of 1.31 mg/dL (H)).   Medical History: Past Medical History:  Diagnosis Date   CAD (coronary artery disease) of artery bypass graft    s/p CABG x 4 in 1997   CHF (congestive heart failure) (HCC)    COPD (chronic obstructive pulmonary disease) (HCC)    Coronary artery disease    CVA (cerebral vascular accident) (HCC)    Diabetes mellitus without complication (HCC)    Dysrhythmia    GERD (gastroesophageal reflux disease)    HLD (hyperlipidemia)    Hypertension    S/P TAVR (transcatheter aortic valve replacement) 07/05/2021   with Edwards 26mm S3UR via TF approach with Dr. Excell Seltzer and Dr. Laneta Simmers   Seizures [facility])     Medications:  Apixaban PTA - last dose 3/1 @ 8am per patient's report.  Assessment: 68yo with hitory of CAD status post CABG in 1997, CHF, COPD, diabetes, gastric reflux, hypertension, hyperlipidemia, aortic valve replacement on Eliquis twice daily presents to ED with chest pain. Troponin elevated at 155 > 215.  Pharmacy consulted to  initiate and manage heparin infusion for ACS. Baseline HL >1.10, aPTT 3.5  INR 1.3  Hgb 12.0  Plt 132  3/2 0408  aPTT 77 Therapeutic x 1   Goal of Therapy:  Heparin level 0.3-0.7 units/ml aPTT 66-102 seconds Monitor platelets by anticoagulation protocol: Yes   Plan:  ( HL elevated at baseline d/t apixaban) Continue heparin infusion at 900 units/hr Check confirmatory aPTT  in 6 hour and use aPTT to monitor heparin infusion until correlating with HL. Plan to repeat HL with AM labs and daily while on heparin Continue to monitor H&H and platelets  Bari Mantis PharmD Clinical Pharmacist 08/05/2023

## 2023-08-05 NOTE — Care Management Obs Status (Signed)
 MEDICARE OBSERVATION STATUS NOTIFICATION   Patient Details  Name: Thomas Moore MRN: [id number] Date of Birth: Jun 19, 1955   Medicare Observation Status Notification Given:  Yes    [name] E Mariell Nester, LCSW 08/05/2023, 3:22 PM

## 2023-08-05 NOTE — Consult Note (Signed)
 [facility] Cardiology  CARDIOLOGY CONSULT NOTE  Patient ID: Thomas Moore MRN: [id number] DOB/AGE: 1955-09-26 68 y.o.  Admit date: 08/04/2023 Referring Physician Ophelia Charter Primary Physician [facility] Cardiologist Welton Flakes Reason for Consultation chest pain  HPI: 68 year old gentleman referred for evaluation of left arm pain (anginal equivalent).  Patient has known history of coronary artery disease, status post CABG 1997 at [facility], status post TAVR 07/05/2021 at [facility], atrial fibrillation, on Eliquis for stroke prevention and amiodarone for rate and rhythm control. Last cardiac catheterization 07/17/2022 which revealed 95% stenosis mid LAD, 99% stenosis ostial left circumflex, occluded proximal RCA, patent LIMA to LAD, patent SVG to OM 2, and occluded SVG to RCA.  Patient presented to [facility] ED 08/04/2023, with intermittent left arm pain.  ECG reveals normal sinus rhythm with nonspecific ST-T wave abnormalities inferolateral leads which appears unchanged.  Admission labs notable for mildly elevated troponin 165, 215, 248, 166.  Patient currently denies chest pain or left arm pain.  Review of systems complete and found to be negative unless listed above     Past Medical History:  Diagnosis Date   CAD (coronary artery disease) of artery bypass graft    s/p CABG x 4 in 1997   CHF (congestive heart failure) (HCC)    COPD (chronic obstructive pulmonary disease) (HCC)    Coronary artery disease    CVA (cerebral vascular accident) (HCC)    Diabetes mellitus without complication (HCC)    Dysrhythmia    GERD (gastroesophageal reflux disease)    HLD (hyperlipidemia)    Hypertension    S/P TAVR (transcatheter aortic valve replacement) 07/05/2021   with Edwards 26mm S3UR via TF approach with Dr. Excell Seltzer and Dr. Laneta Simmers   Seizures [facility])     Past Surgical History:  Procedure Laterality Date   BYPASS GRAFT ANGIOGRAPHY N/A 04/25/2021   Procedure: BYPASS GRAFT ANGIOGRAPHY;  Surgeon: Armando Reichert, MD;  Location:  [facility] INVASIVE CV LAB;  Service: Cardiovascular;  Laterality: N/A;   CARDIAC SURGERY     CORNEAL TRANSPLANT Right    CORONARY ARTERY BYPASS GRAFT  1997   ELECTROPHYSIOLOGY STUDY N/A 09/08/2021   Procedure: ELECTROPHYSIOLOGY STUDY;  Surgeon: Lanier Prude, MD;  Location: [facility] INVASIVE CV LAB;  Service: Cardiovascular;  Laterality: N/A;   EYE SURGERY     FLEXOR TENDON REPAIR Left 07/11/2019   Procedure: FLEXOR tenolysis  REPAIR LEFT RING FINGER with tednon repair;  Surgeon: Kennedy Bucker, MD;  Location: [facility] ORS;  Service: Orthopedics;  Laterality: Left;   INCISION AND DRAINAGE ABSCESS Left 05/08/2019   Procedure: INCISION AND DRAINAGE ABSCESS;  Surgeon: Donato Heinz, MD;  Location: [facility] ORS;  Service: Orthopedics;  Laterality: Left;   INTRAOPERATIVE TRANSTHORACIC ECHOCARDIOGRAM N/A 07/05/2021   Procedure: INTRAOPERATIVE TRANSTHORACIC ECHOCARDIOGRAM;  Surgeon: Tonny Bollman, MD;  Location: [facility] OR;  Service: Open Heart Surgery;  Laterality: N/A;   LEFT HEART CATH AND CORONARY ANGIOGRAPHY Left 06/09/2022   Procedure: LEFT HEART CATH AND CORONARY ANGIOGRAPHY;  Surgeon: Laurier Nancy, MD;  Location: [facility] INVASIVE CV LAB;  Service: Cardiovascular;  Laterality: Left;   LEFT HEART CATH AND CORS/GRAFTS ANGIOGRAPHY N/A 10/02/2019   Procedure: LEFT HEART CATH AND CORS/GRAFTS ANGIOGRAPHY;  Surgeon: Marykay Lex, MD;  Location: [facility] INVASIVE CV LAB;  Service: Cardiovascular;  Laterality: N/A;   LEFT HEART CATH AND CORS/GRAFTS ANGIOGRAPHY N/A 07/17/2022   Procedure: LEFT HEART CATH AND CORS/GRAFTS ANGIOGRAPHY and possible PCI and stent;  Surgeon: Laurier Nancy, MD;  Location: [facility] INVASIVE CV LAB;  Service: Cardiovascular;  Laterality: N/A;   LOOP RECORDER INSERTION N/A 10/06/2019   Procedure: LOOP RECORDER INSERTION;  Surgeon: Marinus Maw, MD;  Location: [facility] INVASIVE CV LAB;  Service: Cardiovascular;  Laterality: N/A;   TRANSCATHETER AORTIC VALVE REPLACEMENT, TRANSFEMORAL N/A 07/05/2021   Procedure:  TRANSCATHETER AORTIC VALVE REPLACEMENT, TRANSFEMORAL;  Surgeon: Tonny Bollman, MD;  Location: [facility] OR;  Service: Open Heart Surgery;  Laterality: N/A;   TRIGGER FINGER RELEASE Left     (Not in a hospital admission)  Social History   Socioeconomic History   Marital status: Single    Spouse name: Not on file   Number of children: 0   Years of education: Not on file   Highest education level: High school graduate  Occupational History   Occupation: Unemployed    Comment: Trying to get disability  Tobacco Use   Smoking status: Former    Current packs/day: 0.00    Types: Cigarettes    Quit date: 1984    Years since quitting: 41.1   Smokeless tobacco: Never   Tobacco comments:    Quit 40 years ago  Vaping Use   Vaping status: Never Used  Substance and Sexual Activity   Alcohol use: Not Currently   Drug use: Never   Sexual activity: Never  Other Topics Concern   Not on file  Social History Narrative   ** Merged History Encounter **    Lives in an apartment on [address] flood, has to climb 17 floors   Social Drivers of Health   Financial Resource Strain: Low Risk  (02/21/2023)   Received [facility]   Overall Financial Resource Strain (CARDIA)    Difficulty of Paying Living Expenses: Not very hard  Food Insecurity: No Food Insecurity (02/21/2023)   Received from [facility]   Hunger Vital Sign    Worried About Running Out of Food in the Last Year: Never true    Ran Out of Food in the Last Year: Never true  Transportation Needs: No Transportation Needs (02/21/2023)   Received from [facility] - Transportation    In the past 12 months, has lack of transportation kept you from medical appointments or from getting medications?: No    Lack of Transportation (Non-Medical): No  Physical Activity: Not on file  Stress: No Stress Concern Present (02/19/2020)   [employer] [facility] [employer] - Occupational Stress  Questionnaire    Feeling of Stress : Not at all  Social Connections: Not on file  Intimate Partner Violence: Not At Risk (10/03/2022)   Humiliation, Afraid, Rape, and Kick questionnaire    Fear of Current or Ex-Partner: No    Emotionally Abused: No    Physically Abused: No    Sexually Abused: No    Family History  Problem Relation Age of Onset   Seizures Brother       Review of systems complete and found to be negative unless listed above      PHYSICAL EXAM  General: Well developed, well nourished, in no acute distress HEENT:  Normocephalic and atramatic Neck:  No JVD.  Lungs: Clear bilaterally to auscultation and percussion. Heart: HRRR . Normal S1 and S2 without gallops or murmurs.  Abdomen: Bowel sounds are positive, abdomen soft and non-tender  Msk:  Back normal, normal gait. Normal strength and tone for age. Extremities: No clubbing, cyanosis or edema.   Neuro: Alert and oriented X 3. Psych:  Good affect, responds appropriately  Labs:   Lab Results  Component Value Date   WBC 5.8 08/05/2023   HGB 12.1 (L) 08/05/2023   HCT 38.8 (L) 08/05/2023   MCV 87.2 08/05/2023   PLT 125 (L) 08/05/2023    Recent Labs  Lab 08/05/23 0409  NA 136  K 3.6  CL 103  CO2 23  BUN 23  CREATININE 1.31*  CALCIUM 8.5*  GLUCOSE 115*   Lab Results  Component Value Date   TROPONINI <0.03 08/08/2017    Lab Results  Component Value Date   CHOL 196 08/05/2023   CHOL 141 05/27/2022   CHOL 143 02/25/2020   Lab Results  Component Value Date   HDL 42 08/05/2023   HDL 32 (L) 05/27/2022   HDL 31 (L) 02/25/2020   Lab Results  Component Value Date   LDLCALC 131 (H) 08/05/2023   LDLCALC 77 05/27/2022   LDLCALC 78 02/25/2020   Lab Results  Component Value Date   TRIG 115 08/05/2023   TRIG 159 (H) 05/27/2022   TRIG 181 (H) 04/24/2021   Lab Results  Component Value Date   CHOLHDL 4.7 08/05/2023   CHOLHDL 4.4 05/27/2022   CHOLHDL 4.6 02/25/2020   No results found for:  "LDLDIRECT"    Radiology: [facility] Chest Port 1 View Result Date: 08/04/2023 CLINICAL DATA:  Chest pain. EXAM: PORTABLE CHEST - 1 VIEW COMPARISON:  06/22/2023 FINDINGS: Heart is mildly enlarged, unchanged from prior exam. Postsurgical changes of CABG, implanted pacemaker, and TAVR are again seen. There has been interval increase of right medial lung base airspace opacity. IMPRESSION: Interval development of right medial lung base opacity which may be result of atelectasis or pneumonia. Electronically Signed   By: Acquanetta Belling M.D.   On: 08/04/2023 18:56    EKG: Normal sinus rhythm with nonspecific ST-T abnormalities inferolaterally which appears unchanged  ASSESSMENT AND PLAN:   1.  Left arm pain, resolved, mildly elevated high sensitive troponin, currently symptom-free 2.  CAD, status post CABG 1997, occluded SVG to RCA 07/17/2022 3.  Status post TAVR 07/05/2021 4.  Chronic HFmrEF, EF 40-45% 11/22/2022 5.  CKD stage IIIa, BUN and creatinine 27 and 1.31  Recommendations  1.  Agree with current therapy 2.  DC heparin 3.  Continue Eliquis for stroke prevention 4.  Continue amiodarone for rate and rhythm control 5.  Review 2D echocardiogram 6.  Ambulate patient, if patient does well consider discharge home 7.  Follow-up Dr. Welton Flakes 1 to 2 weeks  Signed: Marcina Millard MD,PhD, [facility] 08/05/2023, 11:36 AM

## 2023-08-05 NOTE — ED Notes (Signed)
 Advised nurse that patient has ready bed

## 2023-08-05 NOTE — Progress Notes (Signed)
 Progress Note   Patient: Thomas Moore FAO:[id number] DOB: February 10, 1956 DOA: 08/04/2023     0 DOS: the patient was seen and examined on 08/05/2023   Brief hospital course: 68yo with h/o CAD s/p CABG (1997), chronic diastolic CHF, COPD, CVA, DM, HTN, HLD, s/p TAVR, and seizure d/o who presented from [facility] on 3/1 with L arm pain.  This is similar to his prior index symptoms and troponin was elevated with positive delta, concerning for ACS.  Cardiology consulted, started on heparin infusion.  Assessment and Plan:  L arm pain, possibly anginal variant Patient with significant cardiac history who presented with index symptoms of L arm pain that came on acutely with exertion He reports similar pain earlier in the week, resolved with NTG but he ran out of NTG CXR with R medial lung base opacity, not clinically relevant Initial HS troponin elevated; repeat with positive delta - concerning for NSTEMI; however, troponin peaked and is downtrending at this time EKG with no apparent STEMI Will plan to observe on telemetry to further evaluate for ACS Cardiology consultation requested EDP started Heparin drip - transition back to Eliquis today Continue Plavix, Imdur Check echo - if unremarkable, will dc to home today per cardiology recommendations This was ordered on 3/1 but not done on 3/2 and so the patient will have to remain hospitalized overnight and have the echo on 3/3 prior to dc   HTN Continue Toprol XL BP currently controlled at 125/60   HLD Continue Lipitor Check lipids   DM Last A1c was 9.5 in 04/2023, poor control; will recheck Hold Farxiga, Dulaglutide, Jardiance Will cover with sensitive-scale SSI for now   Chronic combinedCHF Echo in 02/2021 with EF 50-55% and moderate AS Appears compensated Hold Farxiga Continue PO Lasix Check Echo   COPD No home meds Remote smoking history   Afib Rate controlled with amiodarone Resume Eliquis   Seizure d/o Continue  Depakote   Stage 3a CKD Appears stable Avoid nephrotoxic medications       Consultants: Cardiology  Procedures: Echocardiogram pending  Antibiotics: None   Subjective: No more pain since presentation.  Able to ambulate without recurrence in symptoms.  Awaiting echo and otherwise ready for dc.  Physical Exam: Vitals:   08/05/23 0300 08/05/23 0422 08/05/23 0530 08/05/23 0700  BP: 112/60  124/64 137/69  Pulse: 81  87   Resp: 18  19 18   Temp:  97.7 F (36.5 C)    TempSrc:  Oral    SpO2: 95%  92%   Weight:      Height:         Intake/Output Summary (Last 24 hours) at 08/05/2023 0827 Last data filed at 08/04/2023 2144 Gross per 24 hour  Intake 3 ml  Output --  Net 3 ml   Filed Weights   08/04/23 1347  Weight: 60.8 kg    Exam:  General:  Appears calm and comfortable and is in NAD Eyes:  R exotropia, mild conjunctival injection with lid lag ENT:  grossly normal hearing, lips & tongue, mmm; poor dentition Neck:  no LAD, masses or thyromegaly Cardiovascular:  RRR. No LE edema.  Respiratory:   CTA bilaterally with no wheezes/rales/rhonchi.  Normal respiratory effort. Abdomen:  soft, NT, ND Skin:  no rash or induration seen on limited exam Musculoskeletal:  grossly normal tone BUE/BLE, good ROM, no bony abnormality Psychiatric:  blunted mood and affect, speech fluent and appropriate Neurologic:  CN 2-12 grossly intact, moves all extremities in coordinated  fashion  Data Reviewed: I have reviewed the patient's lab results since admission.  Pertinent labs for today include:   BUN 23/Creatinine 1.31/GFR 59 - stable HS troponin 155, 215, 248, 166 Lipids: 196/42/131/115 WBC 5.8 Hgb 12.1, stable Platelets 125    Family Communication: None present  Disposition: Status is: Observation The patient remains OBS appropriate and will d/c before 2 midnights.  Planned Discharge Destination: Home    Time spent: 50 minutes  Author: Jonah Blue, MD 08/05/2023 8:25  AM  For on call review [email or web address].

## 2023-08-05 NOTE — ED Notes (Signed)
 Pt ambulated to bathroom and back 1 assist. Tolerated well.

## 2023-08-06 ENCOUNTER — Other Ambulatory Visit: Payer: Self-pay

## 2023-08-06 ENCOUNTER — Observation Stay
Admit: 2023-08-06 | Discharge: 2023-08-06 | Disposition: A | Discharge status: Home / Self Care | Attending: Internal Medicine | Admitting: Internal Medicine

## 2023-08-06 ENCOUNTER — Ambulatory Visit (INDEPENDENT_AMBULATORY_CARE_PROVIDER_SITE_OTHER): Payer: Medicare PPO

## 2023-08-06 DIAGNOSIS — R55 Syncope and collapse: Secondary | ICD-10-CM

## 2023-08-06 DIAGNOSIS — I249 Acute ischemic heart disease, unspecified: Secondary | ICD-10-CM | POA: Diagnosis not present

## 2023-08-06 LAB — ECHOCARDIOGRAM COMPLETE
AR max vel: 2.14 cm2
AV Area VTI: 2.19 cm2
AV Area mean vel: 2.05 cm2
AV Mean grad: 3 mmHg
AV Peak grad: 5.4 mmHg
Ao pk vel: 1.16 m/s
Area-P 1/2: 4.52 cm2
Calc EF: 33.9 %
Height: 62 in
S' Lateral: 3.1 cm
Single Plane A2C EF: 26.5 %
Single Plane A4C EF: 40.9 %
Weight: 2144 [oz_av]

## 2023-08-06 LAB — CBC
HCT: 38.6 % — ABNORMAL LOW (ref 39.0–52.0)
Hemoglobin: 12.3 g/dL — ABNORMAL LOW (ref 13.0–17.0)
MCH: 27.2 pg (ref 26.0–34.0)
MCHC: 31.9 g/dL (ref 30.0–36.0)
MCV: 85.2 fL (ref 80.0–100.0)
Platelets: 125 10*3/uL — ABNORMAL LOW (ref 150–400)
RBC: 4.53 MIL/uL (ref 4.22–5.81)
RDW: 16.5 % — ABNORMAL HIGH (ref 11.5–15.5)
WBC: 4 10*3/uL (ref 4.0–10.5)
nRBC: 0 % (ref 0.0–0.2)

## 2023-08-06 LAB — CUP PACEART REMOTE DEVICE CHECK
Date Time Interrogation Session: 20250302230133
Implantable Pulse Generator Implant Date: 20210503

## 2023-08-06 LAB — MRSA NEXT GEN BY PCR, NASAL: MRSA by PCR Next Gen: DETECTED — AB

## 2023-08-06 LAB — GLUCOSE, CAPILLARY
Glucose-Capillary: 107 mg/dL — ABNORMAL HIGH (ref 70–99)
Glucose-Capillary: 134 mg/dL — ABNORMAL HIGH (ref 70–99)

## 2023-08-06 LAB — HIV ANTIBODY (ROUTINE TESTING W REFLEX): HIV Screen 4th Generation wRfx: NONREACTIVE

## 2023-08-06 MED ORDER — METOPROLOL SUCCINATE ER 25 MG PO TB24
12.5000 mg | ORAL_TABLET | Freq: Every day | ORAL | 0 refills | Status: DC
  Filled 2023-08-06: qty 30, 60d supply, fill #0

## 2023-08-06 MED ORDER — DIVALPROEX SODIUM ER 250 MG PO TB24
250.0000 mg | ORAL_TABLET | Freq: Every day | ORAL | 0 refills | Status: DC
  Filled 2023-08-06: qty 30, 30d supply, fill #0

## 2023-08-06 MED ORDER — ATORVASTATIN CALCIUM 80 MG PO TABS
80.0000 mg | ORAL_TABLET | Freq: Every day | ORAL | 0 refills | Status: DC
  Filled 2023-08-06: qty 30, 30d supply, fill #0

## 2023-08-06 NOTE — Hospital Course (Signed)
 68yo with h/o CAD s/p CABG (1997), chronic diastolic CHF, COPD, CVA, DM, HTN, HLD, s/p TAVR, and seizure d/o who presented from [facility] on 3/1 with L arm pain.  This is similar to his prior index symptoms and troponin was elevated with positive delta, concerning for ACS.  Cardiology consulted, started on heparin infusion. Cardiology recommended stopping Heparin, resuming Eliquis.  If echo is unremarkable, will discharge with plan for f/u with Dr. Welton Flakes in 1-2 weeks.

## 2023-08-06 NOTE — Plan of Care (Signed)

## 2023-08-06 NOTE — Progress Notes (Signed)
*  PRELIMINARY RESULTS* Echocardiogram 2D Echocardiogram has been performed.  Cristela Blue 08/06/2023, 8:51 AM

## 2023-08-06 NOTE — Discharge Summary (Signed)
 Physician Discharge Summary   Patient: Thomas Moore MRN: [id number] DOB: Apr 18, 1956  Admit date:     08/04/2023  Discharge date: 08/06/23  Discharge Physician: Jonah Blue   PCP: Lauro Regulus, MD   Recommendations at discharge:   Make sure not to miss doses of Plavix, Eliquis, and Lipitor Follow up with Dr. Welton Flakes in 1-2 weeks Follow up with Dr. Dareen Piano in 1-2 weeks  Discharge Diagnoses: Principal Problem:   ACS (acute coronary syndrome) [facility]) Active Problems:   Chronic combined systolic and diastolic congestive heart failure (HCC)   Controlled type 2 diabetes mellitus with stage 3 chronic kidney disease, without long-term current use of insulin (HCC)   Essential hypertension   History of stroke   History of seizure   Pure hypercholesterolemia   S/P TAVR (transcatheter aortic valve replacement)   Paroxysmal atrial fibrillation [facility])   Hospital Course: 68yo with h/o CAD s/p CABG (1997), chronic diastolic CHF, COPD, CVA, DM, HTN, HLD, s/p TAVR, and seizure d/o who presented from [facility] on 3/1 with L arm pain.  This is similar to his prior index symptoms and troponin was elevated with positive delta, concerning for ACS.  Cardiology consulted, started on heparin infusion. Cardiology recommended stopping Heparin, resuming Eliquis.  If echo is unremarkable, will discharge with plan for f/u with Dr. Welton Flakes in 1-2 weeks.  Assessment and Plan:  L arm pain, possibly anginal variant Patient with significant cardiac history who presented with index symptoms of L arm pain that came on acutely with exertion He reports similar pain earlier in the week, resolved with NTG but he ran out of NTG CXR with R medial lung base opacity, not clinically relevant Initial HS troponin elevated; repeat with positive delta - concerning for NSTEMI; however, troponin peaked and is downtrending at this time EKG with no apparent STEMI Observed on telemetry to further evaluate for  ACS Cardiology consultation requested Heparin -> Eliquis Continue Plavix, Imdur Echo with EF 30-35% with WMA, grade 3 diastolic dysfunction Cardiology thinks it is reasonable to dc as planned with outpatient f/u in 1-2 weeks given that he remains asymptomatic   HTN Continue Toprol XL BP currently controlled at 131/73   HLD Continue Lipitor LDL 131, ?compliance   DM Last A1c was 9.5 in 04/2023, poor control; will recheck Resume Farxiga, Dulaglutide, Jardiance   Chronic combined CHF Appears compensated Resume Farxiga Continue PO Lasix Echo as above   COPD No home meds Remote smoking history   Afib Rate controlled with amiodarone Resume Eliquis   Seizure d/o Continue Depakote   Stage 3a CKD Appears stable Avoid nephrotoxic medications         Consultants: Cardiology   Procedures: Echocardiogram pending   Antibiotics: None      Pain control - [place] Controlled Substance Reporting System database was reviewed. and patient was instructed, not to drive, operate heavy machinery, perform activities at heights, swimming or participation in water activities or provide baby-sitting services while on Pain, Sleep and Anxiety Medications; until their outpatient Physician has advised to do so again. Also recommended to not to take more than prescribed Pain, Sleep and Anxiety Medications.   Disposition: Home Diet recommendation:  Cardiac and Carb modified diet DISCHARGE MEDICATION: Allergies as of 08/06/2023   No Known Allergies      Medication List     STOP taking these medications    pantoprazole 40 MG tablet Commonly known as: PROTONIX       TAKE these medications  acetaminophen 650 MG CR tablet Commonly known as: TYLENOL Take 1,300 mg by mouth every 8 (eight) hours as needed for pain.   amiodarone 200 MG tablet Commonly known as: Pacerone Take 1 tablet (200 mg total) by mouth daily.   apixaban 5 MG Tabs tablet Commonly known as:  ELIQUIS Take 1 tablet (5 mg total) by mouth 2 (two) times daily.   atorvastatin 80 MG tablet Commonly known as: LIPITOR Take 1 tablet (80 mg total) by mouth at bedtime.   benzonatate 100 MG capsule Commonly known as: Tessalon Perles Take 1 capsule (100 mg total) by mouth 3 (three) times daily as needed for cough.   CALCIUM + D PO Take 1 tablet by mouth 2 (two) times daily.   clopidogrel 75 MG tablet Commonly known as: Plavix Take 1 tablet (75 mg total) by mouth daily.   divalproex 250 MG 24 hr tablet Commonly known as: DEPAKOTE ER Take 1 tablet (250 mg total) by mouth at bedtime.   Dulaglutide 4.5 MG/0.5ML Soaj Inject 4.5 mg into the skin once a week.   furosemide 20 MG tablet Commonly known as: Lasix Take 1 tablet (20 mg total) by mouth daily.   insulin degludec 100 UNIT/ML FlexTouch Pen Commonly known as: TRESIBA Inject 10 Units into the skin daily.   isosorbide mononitrate 30 MG 24 hr tablet Commonly known as: IMDUR Take 1/2 tab dialy   Jardiance 25 MG Tabs tablet Generic drug: empagliflozin Take 25 mg by mouth daily.   metoprolol succinate 25 MG 24 hr tablet Commonly known as: TOPROL-XL Take 0.5 tablets (12.5 mg total) by mouth daily. Start taking on: August 07, 2023   multivitamin capsule Take 1 capsule by mouth daily.   nitroGLYCERIN 0.4 MG SL tablet Commonly known as: NITROSTAT Place 1 tablet (0.4 mg total) under the tongue every 5 (five) minutes as needed for chest pain.        Follow-up Information     Laurier Nancy, MD. Go in 1 week(s).   Specialty: Cardiology Contact information: [address] [place] [address] [phone]                Discharge Exam:   Subjective: Feeling well and wants to go home.   Objective: Vitals:   08/06/23 0801 08/06/23 1245  BP: 124/66 131/73  Pulse: 71 81  Resp: 18 18  Temp: 97.8 F (36.6 C)   SpO2: 100% 100%    Intake/Output Summary (Last 24 hours) at 08/06/2023 1332 Last data  filed at 08/06/2023 1100 Gross per 24 hour  Intake 360 ml  Output --  Net 360 ml   Filed Weights   08/04/23 1347  Weight: 60.8 kg    Exam:  General:  Appears calm and comfortable and is in NAD Eyes:  R exotropia, mild conjunctival injection with lid lag ENT:  grossly normal hearing, lips & tongue, mmm; poor dentition Neck:  no LAD, masses or thyromegaly Cardiovascular:  RRR. No LE edema.  Respiratory:   CTA bilaterally with no wheezes/rales/rhonchi.  Normal respiratory effort. Abdomen:  soft, NT, ND Skin:  no rash or induration seen on limited exam Musculoskeletal:  grossly normal tone BUE/BLE, good ROM, no bony abnormality Psychiatric:  blunted mood and affect, speech fluent and appropriate Neurologic:  CN 2-12 grossly intact, moves all extremities in coordinated fashion  Data Reviewed: I have reviewed the patient's lab results since admission.  Pertinent labs for today include:   Stable CBC    Condition at discharge: fair  The results of significant diagnostics from this hospitalization (including imaging, microbiology, ancillary and laboratory) are listed below for reference.   Imaging Studies: ECHOCARDIOGRAM COMPLETE Result Date: 08/06/2023    ECHOCARDIOGRAM REPORT   Patient Name:   Thomas Moore Xue Date of Exam: 08/06/2023 Medical Rec #:  [id number]      Height:       62.0 in Accession #:    [id number]     Weight:       134.0 lb Date of Birth:  1956-02-16       BSA:          1.612 m Patient Age:    68 years       BP:           124/66 mmHg Patient Gender: M              HR:           71 bpm. Exam Location:  [facility] Procedure: 2D Echo, Cardiac Doppler and Color Doppler (Both Spectral and Color            Flow Doppler were utilized during procedure). STAT ECHO Indications:     Chest pain R07.9  History:         Patient has prior history of Echocardiogram examinations, most                  recent 05/27/2022. CHF; COPD. S/P TAVR.  Sonographer:     Cristela Blue Referring Phys:  [id number] Aarib Pulido Diagnosing Phys: Alwyn Pea MD IMPRESSIONS  1. Left ventricular ejection fraction, by estimation, is 30 to 35%. The left ventricle has moderately decreased function. The left ventricle demonstrates regional wall motion abnormalities (see scoring diagram/findings for description). There is mild eccentric left ventricular hypertrophy of the septal segment. Left ventricular diastolic parameters are consistent with Grade III diastolic dysfunction (restrictive).  2. Right ventricular systolic function is mildly reduced. The right ventricular size is mildly enlarged.  3. Left atrial size was mildly dilated.  4. Right atrial size was mildly dilated.  5. The mitral valve is grossly normal. Mild mitral valve regurgitation.  6. The aortic valve is normal in structure. Aortic valve regurgitation is not visualized. FINDINGS  Left Ventricle: Left ventricular ejection fraction, by estimation, is 30 to 35%. The left ventricle has moderately decreased function. The left ventricle demonstrates regional wall motion abnormalities. Strain was performed and the global longitudinal strain is indeterminate. The left ventricular internal cavity size was normal in size. There is mild eccentric left ventricular hypertrophy of the septal segment. Left ventricular diastolic parameters are consistent with Grade III diastolic dysfunction (restrictive). Right Ventricle: The right ventricular size is mildly enlarged. No increase in right ventricular wall thickness. Right ventricular systolic function is mildly reduced. Left Atrium: Left atrial size was mildly dilated. Right Atrium: Right atrial size was mildly dilated. Pericardium: There is no evidence of pericardial effusion. Mitral Valve: The mitral valve is grossly normal. Mild mitral valve regurgitation. Tricuspid Valve: The tricuspid valve is normal in structure. Tricuspid valve regurgitation is mild. Aortic Valve: The aortic valve is normal in structure. Aortic valve  regurgitation is not visualized. Aortic valve mean gradient measures 3.0 mmHg. Aortic valve peak gradient measures 5.4 mmHg. Aortic valve area, by VTI measures 2.19 cm. There is a TVAR valve present in the aortic position. Pulmonic Valve: The pulmonic valve was normal in structure. Pulmonic valve regurgitation is not visualized. Aorta: The ascending aorta was not well visualized. IAS/Shunts: No  atrial level shunt detected by color flow Doppler. Additional Comments: 3D was performed not requiring image post processing on an independent workstation and was indeterminate.  LEFT VENTRICLE PLAX 2D LVIDd:         4.30 cm      Diastology LVIDs:         3.10 cm      LV e' medial:    6.20 cm/s LV PW:         0.90 cm      LV E/e' medial:  21.1 LV IVS:        1.30 cm      LV e' lateral:   4.35 cm/s LVOT diam:     2.00 cm      LV E/e' lateral: 30.1 LV SV:         47 LV SV Index:   29 LVOT Area:     3.14 cm  LV Volumes (MOD) LV vol d, MOD A2C: 128.0 ml LV vol d, MOD A4C: 120.0 ml LV vol s, MOD A2C: 94.1 ml LV vol s, MOD A4C: 70.9 ml LV SV MOD A2C:     33.9 ml LV SV MOD A4C:     120.0 ml LV SV MOD BP:      42.1 ml RIGHT VENTRICLE RV Basal diam:  4.10 cm RV Mid diam:    2.70 cm LEFT ATRIUM             Index        RIGHT ATRIUM           Index LA diam:        4.50 cm 2.79 cm/m   RA Area:     12.90 cm LA Vol (A2C):   44.2 ml 27.41 ml/m  RA Volume:   33.10 ml  20.53 ml/m LA Vol (A4C):   23.9 ml 14.82 ml/m LA Biplane Vol: 34.9 ml 21.64 ml/m  AORTIC VALVE AV Area (Vmax):    2.14 cm AV Area (Vmean):   2.05 cm AV Area (VTI):     2.19 cm AV Vmax:           116.00 cm/s AV Vmean:          80.400 cm/s AV VTI:            0.217 m AV Peak Grad:      5.4 mmHg AV Mean Grad:      3.0 mmHg LVOT Vmax:         79.00 cm/s LVOT Vmean:        52.500 cm/s LVOT VTI:          0.151 m LVOT/AV VTI ratio: 0.70  AORTA Ao Root diam: 3.50 cm MITRAL VALVE                TRICUSPID VALVE MV Area (PHT): 4.52 cm     TR Peak grad:   14.6 mmHg MV Decel  Time: 168 msec     TR Vmax:        191.00 cm/s MV E velocity: 131.00 cm/s MV A velocity: 46.10 cm/s   SHUNTS MV E/A ratio:  2.84         Systemic VTI:  0.15 m                             Systemic Diam: 2.00 cm Alwyn Pea MD Electronically signed by Alwyn Pea MD Signature Date/Time: 08/06/2023/12:54:30 PM  Final    DG Chest Port 1 View Result Date: 08/04/2023 CLINICAL DATA:  Chest pain. EXAM: PORTABLE CHEST - 1 VIEW COMPARISON:  06/22/2023 FINDINGS: Heart is mildly enlarged, unchanged from prior exam. Postsurgical changes of CABG, implanted pacemaker, and TAVR are again seen. There has been interval increase of right medial lung base airspace opacity. IMPRESSION: Interval development of right medial lung base opacity which may be result of atelectasis or pneumonia. Electronically Signed   By: Acquanetta Belling M.D.   On: 08/04/2023 18:56    Microbiology: Results for orders placed or performed during the hospital encounter of 08/04/23  MRSA Next Gen by PCR, Nasal     Status: Abnormal   Collection Time: 08/06/23  6:35 AM   Specimen: Nasal Mucosa; Nasal Swab  Result Value Ref Range Status   MRSA by PCR Next Gen DETECTED (A) NOT DETECTED Final    Comment: RESULT CALLED TO, READ BACK BY AND VERIFIED WITH: Jackquline Bosch RN [phone] 08/06/23 HNM (NOTE) The GeneXpert MRSA Assay (FDA approved for NASAL specimens only), is one component of a comprehensive MRSA colonization surveillance program. It is not intended to diagnose MRSA infection nor to guide or monitor treatment for MRSA infections. Test performance is not FDA approved in patients less than 28 years old. Performed at [facility], [address] [place], [place] [address]     Labs: CBC: Recent Labs  Lab 08/04/23 1357 08/05/23 0409 08/06/23 0415  WBC 5.6 5.8 4.0  NEUTROABS 4.7 4.6  --   HGB 12.0* 12.1* 12.3*  HCT 38.4* 38.8* 38.6*  MCV 87.7 87.2 85.2  PLT 132* 125* 125*   Basic Metabolic Panel: Recent Labs  Lab  08/04/23 1357 08/05/23 0409  NA 139 136  K 4.0 3.6  CL 103 103  CO2 25 23  GLUCOSE 150* 115*  BUN 27* 23  CREATININE 1.51* 1.31*  CALCIUM 8.8* 8.5*   Liver Function Tests: No results for input(s): "AST", "ALT", "ALKPHOS", "BILITOT", "PROT", "ALBUMIN" in the last 168 hours. CBG: Recent Labs  Lab 08/05/23 1215 08/05/23 1730 08/05/23 2102 08/06/23 0800 08/06/23 1240  GLUCAP 117* 112* 256* 107* 134*    Discharge time spent: greater than 30 minutes.  Signed: Jonah Blue, MD [facility] 08/06/2023

## 2023-08-06 NOTE — Progress Notes (Signed)
*  PRELIMINARY RESULTS* Echocardiogram 2D Echocardiogram has been performed.  Cristela Blue 08/06/2023, 8:50 AM

## 2023-08-06 NOTE — Progress Notes (Cosign Needed Addendum)
 [facility] Cardiology  CARDIOLOGY PROGRESS NOTE  Patient ID: ALSON MCPHEETERS MRN: [id number] DOB/AGE: [birth year] 68 y.o.  Admit date: 08/04/2023 Referring Physician Ophelia Charter Primary Physician [facility] Cardiologist Welton Flakes Reason for Consultation chest pain  HPI: 68 year old gentleman referred for evaluation of left arm pain (anginal equivalent).  Patient has known history of coronary artery disease, status post CABG 1997 at [facility], status post TAVR 07/05/2021 at [facility], atrial fibrillation, on Eliquis for stroke prevention and amiodarone for rate and rhythm control. Last cardiac catheterization 07/17/2022 which revealed 95% stenosis mid LAD, 99% stenosis ostial left circumflex, occluded proximal RCA, patent LIMA to LAD, patent SVG to OM 2, and occluded SVG to RCA.  Patient presented to [facility] ED 08/04/2023, with intermittent left arm pain.  ECG reveals normal sinus rhythm with nonspecific ST-T wave abnormalities inferolateral leads which appears unchanged.  Admission labs notable for mildly elevated troponin 165, 215, 248, 166.   Interval history: -Patient seen and examined this AM, reports he feels well overall.  -Denies any chest pain or SOB symptoms. Also denies palpitations.  -BP and HR remain controlled.  -States arm pain that brought him to the ED has resolved.  Review of systems complete and found to be negative unless listed above     Past Medical History:  Diagnosis Date   CAD (coronary artery disease) of artery bypass graft    s/p CABG x 4 in 1997   CHF (congestive heart failure) (HCC)    COPD (chronic obstructive pulmonary disease) (HCC)    Coronary artery disease    CVA (cerebral vascular accident) (HCC)    Diabetes mellitus without complication (HCC)    Dysrhythmia    GERD (gastroesophageal reflux disease)    HLD (hyperlipidemia)    Hypertension    S/P TAVR (transcatheter aortic valve replacement) 07/05/2021   with Edwards 26mm S3UR via TF approach with Dr. Excell Seltzer and Dr. Laneta Simmers    Seizures [facility])     Past Surgical History:  Procedure Laterality Date   BYPASS GRAFT ANGIOGRAPHY N/A 04/25/2021   Procedure: BYPASS GRAFT ANGIOGRAPHY;  Surgeon: Armando Reichert, MD;  Location: [facility] INVASIVE CV LAB;  Service: Cardiovascular;  Laterality: N/A;   CARDIAC SURGERY     CORNEAL TRANSPLANT Right    CORONARY ARTERY BYPASS GRAFT  1997   ELECTROPHYSIOLOGY STUDY N/A 09/08/2021   Procedure: ELECTROPHYSIOLOGY STUDY;  Surgeon: Lanier Prude, MD;  Location: [facility] INVASIVE CV LAB;  Service: Cardiovascular;  Laterality: N/A;   EYE SURGERY     FLEXOR TENDON REPAIR Left 07/11/2019   Procedure: FLEXOR tenolysis  REPAIR LEFT RING FINGER with tednon repair;  Surgeon: Kennedy Bucker, MD;  Location: [facility] ORS;  Service: Orthopedics;  Laterality: Left;   INCISION AND DRAINAGE ABSCESS Left 05/08/2019   Procedure: INCISION AND DRAINAGE ABSCESS;  Surgeon: Donato Heinz, MD;  Location: [facility] ORS;  Service: Orthopedics;  Laterality: Left;   INTRAOPERATIVE TRANSTHORACIC ECHOCARDIOGRAM N/A 07/05/2021   Procedure: INTRAOPERATIVE TRANSTHORACIC ECHOCARDIOGRAM;  Surgeon: Tonny Bollman, MD;  Location: [facility] OR;  Service: Open Heart Surgery;  Laterality: N/A;   LEFT HEART CATH AND CORONARY ANGIOGRAPHY Left 06/09/2022   Procedure: LEFT HEART CATH AND CORONARY ANGIOGRAPHY;  Surgeon: Laurier Nancy, MD;  Location: [facility] INVASIVE CV LAB;  Service: Cardiovascular;  Laterality: Left;   LEFT HEART CATH AND CORS/GRAFTS ANGIOGRAPHY N/A 10/02/2019   Procedure: LEFT HEART CATH AND CORS/GRAFTS ANGIOGRAPHY;  Surgeon: Marykay Lex, MD;  Location: [facility] INVASIVE CV LAB;  Service: Cardiovascular;  Laterality: N/A;   LEFT  HEART CATH AND CORS/GRAFTS ANGIOGRAPHY N/A 07/17/2022   Procedure: LEFT HEART CATH AND CORS/GRAFTS ANGIOGRAPHY and possible PCI and stent;  Surgeon: Laurier Nancy, MD;  Location: [facility] INVASIVE CV LAB;  Service: Cardiovascular;  Laterality: N/A;   LOOP RECORDER INSERTION N/A 10/06/2019   Procedure: LOOP RECORDER  INSERTION;  Surgeon: Marinus Maw, MD;  Location: [facility] INVASIVE CV LAB;  Service: Cardiovascular;  Laterality: N/A;   TRANSCATHETER AORTIC VALVE REPLACEMENT, TRANSFEMORAL N/A 07/05/2021   Procedure: TRANSCATHETER AORTIC VALVE REPLACEMENT, TRANSFEMORAL;  Surgeon: Tonny Bollman, MD;  Location: [facility] OR;  Service: Open Heart Surgery;  Laterality: N/A;   TRIGGER FINGER RELEASE Left     Medications Prior to Admission  Medication Sig Dispense Refill Last Dose/Taking   acetaminophen (TYLENOL) 650 MG CR tablet Take 1,300 mg by mouth every 8 (eight) hours as needed for pain.   Taking As Needed   amiodarone (PACERONE) 200 MG tablet Take 1 tablet (200 mg total) by mouth daily. 30 tablet 3 08/04/2023   apixaban (ELIQUIS) 5 MG TABS tablet Take 1 tablet (5 mg total) by mouth 2 (two) times daily. 180 tablet 1 08/04/2023 at  8:30 AM   benzonatate (TESSALON PERLES) 100 MG capsule Take 1 capsule (100 mg total) by mouth 3 (three) times daily as needed for cough. 20 capsule 0 Taking As Needed   Calcium Citrate-Vitamin D (CALCIUM + D PO) Take 1 tablet by mouth 2 (two) times daily.   08/04/2023   clopidogrel (PLAVIX) 75 MG tablet Take 1 tablet (75 mg total) by mouth daily. 30 tablet 11 08/04/2023   divalproex (DEPAKOTE ER) 250 MG 24 hr tablet Take 1 tablet (250 mg total) by mouth at bedtime. 30 tablet 0 Past Month   Dulaglutide 4.5 MG/0.5ML SOAJ Inject 4.5 mg into the skin once a week.   08/02/2023   furosemide (LASIX) 20 MG tablet Take 1 tablet (20 mg total) by mouth daily. 30 tablet 1 08/04/2023   insulin degludec (TRESIBA) 100 UNIT/ML FlexTouch Pen Inject 10 Units into the skin daily.   08/04/2023   isosorbide mononitrate (IMDUR) 30 MG 24 hr tablet Take 1/2 tab dialy 30 tablet 1 08/04/2023   JARDIANCE 25 MG TABS tablet Take 25 mg by mouth daily.   08/04/2023   Multiple Vitamin (MULTIVITAMIN) capsule Take 1 capsule by mouth daily.   08/04/2023   nitroGLYCERIN (NITROSTAT) 0.4 MG SL tablet Place 1 tablet (0.4 mg total) under the tongue  every 5 (five) minutes as needed for chest pain. 30 tablet 2 08/03/2023   atorvastatin (LIPITOR) 80 MG tablet Take 1 tablet (80 mg total) by mouth at bedtime. (Patient not taking: Reported on 08/04/2023) 30 tablet 1 Not Taking   pantoprazole (PROTONIX) 40 MG tablet Take 40 mg by mouth daily. (Patient not taking: Reported on 08/04/2023)   Not Taking   Social History   Socioeconomic History   Marital status: Single    Spouse name: Not on file   Number of children: 0   Years of education: Not on file   Highest education level: High school graduate  Occupational History   Occupation: Unemployed    Comment: Trying to get disability  Tobacco Use   Smoking status: Former    Current packs/day: 0.00    Types: Cigarettes    Quit date: 1984    Years since quitting: 41.1   Smokeless tobacco: Never   Tobacco comments:    Quit 40 years ago  Vaping Use   Vaping status: Never Used  Substance and Sexual Activity   Alcohol use: Not Currently   Drug use: Never   Sexual activity: Never  Other Topics Concern   Not on file  Social History Narrative   ** Merged History Encounter **    Lives in an apartment on [address], has to climb 17 floors   Social Drivers of Health   Financial Resource Strain: Low Risk  (02/21/2023)   Received from [facility]   Overall Financial Resource Strain (CARDIA)    Difficulty of Paying Living Expenses: Not very hard  Food Insecurity: No Food Insecurity (08/05/2023)   Hunger Vital Sign    Worried About Running Out of Food in the Last Year: Never true    Ran Out of Food in the Last Year: Never true  Transportation Needs: No Transportation Needs (08/05/2023)   PRAPARE - Administrator, Civil Service (Medical): No    Lack of Transportation (Non-Medical): No  Physical Activity: Not on file  Stress: No Stress Concern Present (02/19/2020)   [employer] of Occupational Health - Occupational Stress Questionnaire    Feeling of Stress : Not at  all  Social Connections: Moderately Isolated (08/05/2023)   Social Connection and Isolation Panel [NHANES]    Frequency of Communication with Friends and Family: More than three times a week    Frequency of Social Gatherings with Friends and Family: More than three times a week    Attends Religious Services: More than 4 times per year    Active Member of [employer] or Organizations: No    Attends Banker Meetings: Never    Marital Status: Divorced  Catering manager Violence: Not At Risk (08/05/2023)   Humiliation, Afraid, Rape, and Kick questionnaire    Fear of Current or Ex-Partner: No    Emotionally Abused: No    Physically Abused: No    Sexually Abused: No    Family History  Problem Relation Age of Onset   Seizures Brother       Review of systems complete and found to be negative unless listed above    PHYSICAL EXAM General: Well developed, well nourished, in no acute distress HEENT:  Normocephalic and atramatic Neck:  No JVD.  Lungs: Clear bilaterally to auscultation and percussion. Heart: HRRR . Normal S1 and S2 without gallops or murmurs.  Abdomen: Bowel sounds are positive, abdomen soft and non-tender  Msk:  Back normal, normal gait. Normal strength and tone for age. Extremities: No clubbing, cyanosis or edema.   Neuro: Alert and oriented X 3. Psych:  Good affect, responds appropriately  Labs:   Lab Results  Component Value Date   WBC 4.0 08/06/2023   HGB 12.3 (L) 08/06/2023   HCT 38.6 (L) 08/06/2023   MCV 85.2 08/06/2023   PLT 125 (L) 08/06/2023    Recent Labs  Lab 08/05/23 0409  NA 136  K 3.6  CL 103  CO2 23  BUN 23  CREATININE 1.31*  CALCIUM 8.5*  GLUCOSE 115*   Lab Results  Component Value Date   TROPONINI <0.03 08/08/2017    Lab Results  Component Value Date   CHOL 196 08/05/2023   CHOL 141 05/27/2022   CHOL 143 02/25/2020   Lab Results  Component Value Date   HDL 42 08/05/2023   HDL 32 (L) 05/27/2022   HDL 31 (L) 02/25/2020    Lab Results  Component Value Date   LDLCALC 131 (H) 08/05/2023   LDLCALC 77 05/27/2022   LDLCALC  78 02/25/2020   Lab Results  Component Value Date   TRIG 115 08/05/2023   TRIG 159 (H) 05/27/2022   TRIG 181 (H) 04/24/2021   Lab Results  Component Value Date   CHOLHDL 4.7 08/05/2023   CHOLHDL 4.4 05/27/2022   CHOLHDL 4.6 02/25/2020   No results found for: "LDLDIRECT"    Radiology: ECHOCARDIOGRAM COMPLETE Result Date: 08/06/2023    ECHOCARDIOGRAM REPORT   Patient Name:   Thomas Moore Renken Date of Exam: 08/06/2023 Medical Rec #:  [id number]      Height:       62.0 in Accession #:    [id number]     Weight:       134.0 lb Date of Birth:  June 07, 1955       BSA:          1.612 m Patient Age:    68 years       BP:           124/66 mmHg Patient Gender: M              HR:           71 bpm. Exam Location:  [facility] Procedure: 2D Echo, Cardiac Doppler and Color Doppler (Both Spectral and Color            Flow Doppler were utilized during procedure). STAT ECHO Indications:     Chest pain R07.9  History:         Patient has prior history of Echocardiogram examinations, most                  recent 05/27/2022. CHF; COPD. S/P TAVR.  Sonographer:     Cristela Blue Referring Phys:  [id number] JENNIFER YATES Diagnosing Phys: Alwyn Pea MD IMPRESSIONS  1. Left ventricular ejection fraction, by estimation, is 30 to 35%. The left ventricle has moderately decreased function. The left ventricle demonstrates regional wall motion abnormalities (see scoring diagram/findings for description). There is mild eccentric left ventricular hypertrophy of the septal segment. Left ventricular diastolic parameters are consistent with Grade III diastolic dysfunction (restrictive).  2. Right ventricular systolic function is mildly reduced. The right ventricular size is mildly enlarged.  3. Left atrial size was mildly dilated.  4. Right atrial size was mildly dilated.  5. The mitral valve is grossly normal. Mild mitral valve regurgitation.  6. The  aortic valve is normal in structure. Aortic valve regurgitation is not visualized. FINDINGS  Left Ventricle: Left ventricular ejection fraction, by estimation, is 30 to 35%. The left ventricle has moderately decreased function. The left ventricle demonstrates regional wall motion abnormalities. Strain was performed and the global longitudinal strain is indeterminate. The left ventricular internal cavity size was normal in size. There is mild eccentric left ventricular hypertrophy of the septal segment. Left ventricular diastolic parameters are consistent with Grade III diastolic dysfunction (restrictive). Right Ventricle: The right ventricular size is mildly enlarged. No increase in right ventricular wall thickness. Right ventricular systolic function is mildly reduced. Left Atrium: Left atrial size was mildly dilated. Right Atrium: Right atrial size was mildly dilated. Pericardium: There is no evidence of pericardial effusion. Mitral Valve: The mitral valve is grossly normal. Mild mitral valve regurgitation. Tricuspid Valve: The tricuspid valve is normal in structure. Tricuspid valve regurgitation is mild. Aortic Valve: The aortic valve is normal in structure. Aortic valve regurgitation is not visualized. Aortic valve mean gradient measures 3.0 mmHg. Aortic valve peak gradient measures 5.4 mmHg. Aortic valve area, by VTI measures  2.19 cm. There is a TVAR valve present in the aortic position. Pulmonic Valve: The pulmonic valve was normal in structure. Pulmonic valve regurgitation is not visualized. Aorta: The ascending aorta was not well visualized. IAS/Shunts: No atrial level shunt detected by color flow Doppler. Additional Comments: 3D was performed not requiring image post processing on an independent workstation and was indeterminate.  LEFT VENTRICLE PLAX 2D LVIDd:         4.30 cm      Diastology LVIDs:         3.10 cm      LV e' medial:    6.20 cm/s LV PW:         0.90 cm      LV E/e' medial:  21.1 LV IVS:         1.30 cm      LV e' lateral:   4.35 cm/s LVOT diam:     2.00 cm      LV E/e' lateral: 30.1 LV SV:         47 LV SV Index:   29 LVOT Area:     3.14 cm  LV Volumes (MOD) LV vol d, MOD A2C: 128.0 ml LV vol d, MOD A4C: 120.0 ml LV vol s, MOD A2C: 94.1 ml LV vol s, MOD A4C: 70.9 ml LV SV MOD A2C:     33.9 ml LV SV MOD A4C:     120.0 ml LV SV MOD BP:      42.1 ml RIGHT VENTRICLE RV Basal diam:  4.10 cm RV Mid diam:    2.70 cm LEFT ATRIUM             Index        RIGHT ATRIUM           Index LA diam:        4.50 cm 2.79 cm/m   RA Area:     12.90 cm LA Vol (A2C):   44.2 ml 27.41 ml/m  RA Volume:   33.10 ml  20.53 ml/m LA Vol (A4C):   23.9 ml 14.82 ml/m LA Biplane Vol: 34.9 ml 21.64 ml/m  AORTIC VALVE AV Area (Vmax):    2.14 cm AV Area (Vmean):   2.05 cm AV Area (VTI):     2.19 cm AV Vmax:           116.00 cm/s AV Vmean:          80.400 cm/s AV VTI:            0.217 m AV Peak Grad:      5.4 mmHg AV Mean Grad:      3.0 mmHg LVOT Vmax:         79.00 cm/s LVOT Vmean:        52.500 cm/s LVOT VTI:          0.151 m LVOT/AV VTI ratio: 0.70  AORTA Ao Root diam: 3.50 cm MITRAL VALVE                TRICUSPID VALVE MV Area (PHT): 4.52 cm     TR Peak grad:   14.6 mmHg MV Decel Time: 168 msec     TR Vmax:        191.00 cm/s MV E velocity: 131.00 cm/s MV A velocity: 46.10 cm/s   SHUNTS MV E/A ratio:  2.84         Systemic VTI:  0.15 m  Systemic Diam: 2.00 cm Alwyn Pea MD Electronically signed by Alwyn Pea MD Signature Date/Time: 08/06/2023/12:54:30 PM    Final    DG Chest Port 1 View Result Date: 08/04/2023 CLINICAL DATA:  Chest pain. EXAM: PORTABLE CHEST - 1 VIEW COMPARISON:  06/22/2023 FINDINGS: Heart is mildly enlarged, unchanged from prior exam. Postsurgical changes of CABG, implanted pacemaker, and TAVR are again seen. There has been interval increase of right medial lung base airspace opacity. IMPRESSION: Interval development of right medial lung base opacity which may be  result of atelectasis or pneumonia. Electronically Signed   By: Acquanetta Belling M.D.   On: 08/04/2023 18:56    EKG: Normal sinus rhythm with nonspecific ST-T abnormalities inferolaterally which appears unchanged  ASSESSMENT AND PLAN:   1.  Left arm pain, resolved, mildly elevated high sensitive troponin, currently symptom-free 2.  CAD, status post CABG 1997, occluded SVG to RCA 07/17/2022 3.  Status post TAVR 07/05/2021 4.  Chronic HFmrEF, EF 40-45% 11/22/2022 5.  CKD stage IIIa, BUN and creatinine 27 and 1.31  Recommendations 1.  Agree with current therapy 2.  Continue Eliquis for stroke prevention 3.  Continue amiodarone for rate and rhythm control 4.  No plan for further cardiac diagnostics as patient is asymptomatic.  5.  Follow-up Dr. Welton Flakes 1 to 2 weeks  This patient's plan of care was discussed and created with Dr. Juliann Pares and he is in agreement.    SignedGale Journey, PA-C  08/06/2023, 1:22 PM

## 2023-08-08 ENCOUNTER — Other Ambulatory Visit: Payer: Self-pay

## 2023-08-08 DIAGNOSIS — I2510 Atherosclerotic heart disease of native coronary artery without angina pectoris: Secondary | ICD-10-CM

## 2023-08-08 DIAGNOSIS — I951 Orthostatic hypotension: Secondary | ICD-10-CM

## 2023-08-08 DIAGNOSIS — N179 Acute kidney failure, unspecified: Secondary | ICD-10-CM

## 2023-08-08 DIAGNOSIS — R42 Dizziness and giddiness: Secondary | ICD-10-CM

## 2023-08-08 DIAGNOSIS — I5042 Chronic combined systolic (congestive) and diastolic (congestive) heart failure: Secondary | ICD-10-CM

## 2023-08-08 DIAGNOSIS — I480 Paroxysmal atrial fibrillation: Secondary | ICD-10-CM

## 2023-08-08 MED ORDER — AMIODARONE HCL 200 MG PO TABS: 200.0000 mg | ORAL_TABLET | Freq: Every day | ORAL | 3 refills | Status: DC

## 2023-08-08 NOTE — Progress Notes (Signed)
 [facility] Summary Report / Loop Recorder

## 2023-08-13 ENCOUNTER — Ambulatory Visit: Admitting: Cardiovascular Disease

## 2023-08-13 ENCOUNTER — Encounter: Payer: Self-pay | Admitting: Cardiovascular Disease

## 2023-08-13 VITALS — BP 108/68 | HR 75 | Ht 63.0 in | Wt 132.0 lb

## 2023-08-13 DIAGNOSIS — E1122 Type 2 diabetes mellitus with diabetic chronic kidney disease: Secondary | ICD-10-CM

## 2023-08-13 DIAGNOSIS — R0789 Other chest pain: Secondary | ICD-10-CM | POA: Diagnosis not present

## 2023-08-13 DIAGNOSIS — J069 Acute upper respiratory infection, unspecified: Secondary | ICD-10-CM

## 2023-08-13 DIAGNOSIS — R0602 Shortness of breath: Secondary | ICD-10-CM

## 2023-08-13 DIAGNOSIS — I10 Essential (primary) hypertension: Secondary | ICD-10-CM | POA: Diagnosis not present

## 2023-08-13 DIAGNOSIS — I480 Paroxysmal atrial fibrillation: Secondary | ICD-10-CM | POA: Diagnosis not present

## 2023-08-13 DIAGNOSIS — I5042 Chronic combined systolic (congestive) and diastolic (congestive) heart failure: Secondary | ICD-10-CM

## 2023-08-13 DIAGNOSIS — I252 Old myocardial infarction: Secondary | ICD-10-CM

## 2023-08-13 DIAGNOSIS — I2510 Atherosclerotic heart disease of native coronary artery without angina pectoris: Secondary | ICD-10-CM

## 2023-08-13 DIAGNOSIS — N179 Acute kidney failure, unspecified: Secondary | ICD-10-CM

## 2023-08-13 NOTE — Progress Notes (Signed)
 Cardiology Office Note   Date:  08/13/2023   ID:  Thomas Moore, DOB 04/10/1956, MRN [id number]  PCP:  Lauro Regulus, MD  Cardiologist:  Adrian Blackwater, MD      History of Present Illness: Thomas Moore is a 68 y.o. male who presents for  Chief Complaint  Patient presents with   Follow-up    Has URI, getting cough, shortness of breath      Past Medical History:  Diagnosis Date   CAD (coronary artery disease) of artery bypass graft    s/p CABG x 4 in 1997   CHF (congestive heart failure) (HCC)    COPD (chronic obstructive pulmonary disease) (HCC)    Coronary artery disease    CVA (cerebral vascular accident) (HCC)    Diabetes mellitus without complication (HCC)    Dysrhythmia    GERD (gastroesophageal reflux disease)    HLD (hyperlipidemia)    Hypertension    S/P TAVR (transcatheter aortic valve replacement) 07/05/2021   with Edwards 26mm S3UR via TF approach with Dr. Excell Seltzer and Dr. Laneta Simmers   Seizures [facility])      Past Surgical History:  Procedure Laterality Date   BYPASS GRAFT ANGIOGRAPHY N/A 04/25/2021   Procedure: BYPASS GRAFT ANGIOGRAPHY;  Surgeon: Armando Reichert, MD;  Location: [facility] INVASIVE CV LAB;  Service: Cardiovascular;  Laterality: N/A;   CARDIAC SURGERY     CORNEAL TRANSPLANT Right    CORONARY ARTERY BYPASS GRAFT  1997   ELECTROPHYSIOLOGY STUDY N/A 09/08/2021   Procedure: ELECTROPHYSIOLOGY STUDY;  Surgeon: Lanier Prude, MD;  Location: [facility] INVASIVE CV LAB;  Service: Cardiovascular;  Laterality: N/A;   EYE SURGERY     FLEXOR TENDON REPAIR Left 07/11/2019   Procedure: FLEXOR tenolysis  REPAIR LEFT RING FINGER with tednon repair;  Surgeon: Kennedy Bucker, MD;  Location: [facility] ORS;  Service: Orthopedics;  Laterality: Left;   INCISION AND DRAINAGE ABSCESS Left 05/08/2019   Procedure: INCISION AND DRAINAGE ABSCESS;  Surgeon: Donato Heinz, MD;  Location: [facility] ORS;  Service: Orthopedics;  Laterality: Left;   INTRAOPERATIVE TRANSTHORACIC  ECHOCARDIOGRAM N/A 07/05/2021   Procedure: INTRAOPERATIVE TRANSTHORACIC ECHOCARDIOGRAM;  Surgeon: Tonny Bollman, MD;  Location: [facility] OR;  Service: Open Heart Surgery;  Laterality: N/A;   LEFT HEART CATH AND CORONARY ANGIOGRAPHY Left 06/09/2022   Procedure: LEFT HEART CATH AND CORONARY ANGIOGRAPHY;  Surgeon: Laurier Nancy, MD;  Location: [facility] INVASIVE CV LAB;  Service: Cardiovascular;  Laterality: Left;   LEFT HEART CATH AND CORS/GRAFTS ANGIOGRAPHY N/A 10/02/2019   Procedure: LEFT HEART CATH AND CORS/GRAFTS ANGIOGRAPHY;  Surgeon: Marykay Lex, MD;  Location: [facility] INVASIVE CV LAB;  Service: Cardiovascular;  Laterality: N/A;   LEFT HEART CATH AND CORS/GRAFTS ANGIOGRAPHY N/A 07/17/2022   Procedure: LEFT HEART CATH AND CORS/GRAFTS ANGIOGRAPHY and possible PCI and stent;  Surgeon: Laurier Nancy, MD;  Location: [facility] INVASIVE CV LAB;  Service: Cardiovascular;  Laterality: N/A;   LOOP RECORDER INSERTION N/A 10/06/2019   Procedure: LOOP RECORDER INSERTION;  Surgeon: Marinus Maw, MD;  Location: [facility] INVASIVE CV LAB;  Service: Cardiovascular;  Laterality: N/A;   TRANSCATHETER AORTIC VALVE REPLACEMENT, TRANSFEMORAL N/A 07/05/2021   Procedure: TRANSCATHETER AORTIC VALVE REPLACEMENT, TRANSFEMORAL;  Surgeon: Tonny Bollman, MD;  Location: [facility] OR;  Service: Open Heart Surgery;  Laterality: N/A;   TRIGGER FINGER RELEASE Left      Current Outpatient Medications  Medication Sig Dispense Refill   acetaminophen (TYLENOL) 650 MG CR tablet Take 1,300 mg by mouth every  8 (eight) hours as needed for pain.     amiodarone (PACERONE) 200 MG tablet Take 1 tablet (200 mg total) by mouth daily. 30 tablet 3   apixaban (ELIQUIS) 5 MG TABS tablet Take 1 tablet (5 mg total) by mouth 2 (two) times daily. 180 tablet 1   atorvastatin (LIPITOR) 80 MG tablet Take 1 tablet (80 mg total) by mouth at bedtime. 30 tablet 0   benzonatate (TESSALON PERLES) 100 MG capsule Take 1 capsule (100 mg total) by mouth 3 (three) times daily as  needed for cough. 20 capsule 0   Calcium Citrate-Vitamin D (CALCIUM + D PO) Take 1 tablet by mouth 2 (two) times daily.     clopidogrel (PLAVIX) 75 MG tablet Take 1 tablet (75 mg total) by mouth daily. 30 tablet 11   divalproex (DEPAKOTE ER) 250 MG 24 hr tablet Take 1 tablet (250 mg total) by mouth at bedtime. 30 tablet 0   Dulaglutide 4.5 MG/0.5ML SOAJ Inject 4.5 mg into the skin once a week.     furosemide (LASIX) 20 MG tablet Take 1 tablet (20 mg total) by mouth daily. 30 tablet 1   insulin degludec (TRESIBA) 100 UNIT/ML FlexTouch Pen Inject 10 Units into the skin daily.     isosorbide mononitrate (IMDUR) 30 MG 24 hr tablet Take 1/2 tab dialy 30 tablet 1   JARDIANCE 25 MG TABS tablet Take 25 mg by mouth daily.     metoprolol succinate (TOPROL-XL) 25 MG 24 hr tablet Take 0.5 tablets (12.5 mg total) by mouth daily. 30 tablet 0   Multiple Vitamin (MULTIVITAMIN) capsule Take 1 capsule by mouth daily.     nitroGLYCERIN (NITROSTAT) 0.4 MG SL tablet Place 1 tablet (0.4 mg total) under the tongue every 5 (five) minutes as needed for chest pain. 30 tablet 2   No current facility-administered medications for this visit.    Allergies:   Patient has no known allergies.    Social History:   reports that he quit smoking about 41 years ago. His smoking use included cigarettes. He has never used smokeless tobacco. He reports that he does not currently use alcohol. He reports that he does not use drugs.   Family History:  family history includes Seizures in his brother.    ROS:     Review of Systems  Constitutional: Negative.   HENT: Negative.    Eyes: Negative.   Respiratory: Negative.    Gastrointestinal: Negative.   Genitourinary: Negative.   Musculoskeletal: Negative.   Skin: Negative.   Neurological: Negative.   Endo/Heme/Allergies: Negative.   Psychiatric/Behavioral: Negative.    All other systems reviewed and are negative.     All other systems are reviewed and negative.     PHYSICAL EXAM: VS:  BP 108/68   Pulse 75   Ht 5\' 3"  (1.6 m)   Wt 132 lb (59.9 kg)   SpO2 99%   BMI 23.38 kg/m  , BMI Body mass index is 23.38 kg/m. Last weight:  Wt Readings from Last 3 Encounters:  08/13/23 132 lb (59.9 kg)  08/04/23 134 lb (60.8 kg)  05/28/23 135 lb 9.6 oz (61.5 kg)     Physical Exam Vitals reviewed.  Constitutional:      Appearance: Normal appearance. He is normal weight.  HENT:     Head: Normocephalic.     Nose: Nose normal.     Mouth/Throat:     Mouth: Mucous membranes are moist.  Eyes:     Pupils: Pupils are  equal, round, and reactive to light.  Cardiovascular:     Rate and Rhythm: Normal rate and regular rhythm.     Pulses: Normal pulses.     Heart sounds: Normal heart sounds.  Pulmonary:     Effort: Pulmonary effort is normal.  Abdominal:     General: Abdomen is flat. Bowel sounds are normal.  Musculoskeletal:        General: Normal range of motion.     Cervical back: Normal range of motion.  Skin:    General: Skin is warm.  Neurological:     General: No focal deficit present.     Mental Status: He is alert.  Psychiatric:        Mood and Affect: Mood normal.       EKG:   Recent Labs: 01/15/2023: ALT 14 06/23/2023: B Natriuretic Peptide 842.2 08/05/2023: BUN 23; Creatinine, Ser 1.31; Potassium 3.6; Sodium 136 08/06/2023: Hemoglobin 12.3; Platelets 125    Lipid Panel    Component Value Date/Time   CHOL 196 08/05/2023 0409   CHOL 143 02/25/2020 1126   TRIG 115 08/05/2023 0409   HDL 42 08/05/2023 0409   HDL 31 (L) 02/25/2020 1126   CHOLHDL 4.7 08/05/2023 0409   VLDL 23 08/05/2023 0409   LDLCALC 131 (H) 08/05/2023 0409   LDLCALC 78 02/25/2020 1126      Other studies Reviewed: Additional studies/ records that were reviewed today include:  Review of the above records demonstrates:       No data to display            ASSESSMENT AND PLAN:    ICD-10-CM   1. Other chest pain  R07.89 MYOCARDIAL PERFUSION IMAGING    Admitted with Left arm pain and mildly elvated troponins over 200, ECHO LVEF 35%, lowwer than in past 45%. Advise stress test    2. Paroxysmal atrial fibrillation (HCC)  I48.0 MYOCARDIAL PERFUSION IMAGING    3. SOB (shortness of breath)  R06.02 MYOCARDIAL PERFUSION IMAGING    4. Essential hypertension  I10 MYOCARDIAL PERFUSION IMAGING    5. AKI (acute kidney injury) (HCC)  N17.9 MYOCARDIAL PERFUSION IMAGING    6. Coronary artery disease with hx of myocardial infarct w/o hx of CABG  I25.10 MYOCARDIAL PERFUSION IMAGING   I25.2     7. Chronic combined systolic and diastolic congestive heart failure (HCC)  I50.42 MYOCARDIAL PERFUSION IMAGING       Problem List Items Addressed This Visit       Cardiovascular and Mediastinum   Essential hypertension   Relevant Orders   MYOCARDIAL PERFUSION IMAGING   Coronary artery disease with hx of myocardial infarct w/o hx of CABG   Relevant Orders   MYOCARDIAL PERFUSION IMAGING   Paroxysmal atrial fibrillation (HCC)   Relevant Orders   MYOCARDIAL PERFUSION IMAGING   Chronic combined systolic and diastolic congestive heart failure (HCC)   Relevant Orders   MYOCARDIAL PERFUSION IMAGING     Genitourinary   AKI (acute kidney injury) (HCC)   Relevant Orders   MYOCARDIAL PERFUSION IMAGING     Other   Chest pain - Primary   Relevant Orders   MYOCARDIAL PERFUSION IMAGING   SOB (shortness of breath)   Relevant Orders   MYOCARDIAL PERFUSION IMAGING       Disposition:   Return in about 3 weeks (around 09/03/2023) for stress test and f/u.    Total time spent: 30 minutes  Signed,  Adrian Blackwater, MD  08/13/2023 2:42 PM  [facility]

## 2023-08-27 ENCOUNTER — Encounter

## 2023-08-30 ENCOUNTER — Ambulatory Visit: Payer: Medicare HMO | Admitting: Cardiovascular Disease

## 2023-08-31 ENCOUNTER — Ambulatory Visit: Admitting: Cardiovascular Disease

## 2023-09-03 ENCOUNTER — Encounter

## 2023-09-06 ENCOUNTER — Ambulatory Visit: Admitting: Cardiovascular Disease

## 2023-09-06 NOTE — Progress Notes (Signed)
 [facility] Summary Report / Loop Recorder

## 2023-09-10 ENCOUNTER — Ambulatory Visit: Payer: Medicare PPO

## 2023-09-10 ENCOUNTER — Ambulatory Visit (INDEPENDENT_AMBULATORY_CARE_PROVIDER_SITE_OTHER)

## 2023-09-10 DIAGNOSIS — R0789 Other chest pain: Secondary | ICD-10-CM

## 2023-09-10 DIAGNOSIS — I2510 Atherosclerotic heart disease of native coronary artery without angina pectoris: Secondary | ICD-10-CM | POA: Diagnosis not present

## 2023-09-10 DIAGNOSIS — I5042 Chronic combined systolic (congestive) and diastolic (congestive) heart failure: Secondary | ICD-10-CM

## 2023-09-10 DIAGNOSIS — R55 Syncope and collapse: Secondary | ICD-10-CM | POA: Diagnosis not present

## 2023-09-10 DIAGNOSIS — I480 Paroxysmal atrial fibrillation: Secondary | ICD-10-CM | POA: Diagnosis not present

## 2023-09-10 DIAGNOSIS — R0602 Shortness of breath: Secondary | ICD-10-CM

## 2023-09-10 DIAGNOSIS — N179 Acute kidney failure, unspecified: Secondary | ICD-10-CM

## 2023-09-10 DIAGNOSIS — I10 Essential (primary) hypertension: Secondary | ICD-10-CM

## 2023-09-10 LAB — CUP PACEART REMOTE DEVICE CHECK
Date Time Interrogation Session: 20250406230243
Implantable Pulse Generator Implant Date: 20210503

## 2023-09-13 ENCOUNTER — Encounter: Payer: Self-pay | Admitting: Cardiovascular Disease

## 2023-09-13 ENCOUNTER — Ambulatory Visit (INDEPENDENT_AMBULATORY_CARE_PROVIDER_SITE_OTHER): Admitting: Cardiovascular Disease

## 2023-09-13 VITALS — BP 141/87 | HR 100 | Ht 63.0 in | Wt 134.0 lb

## 2023-09-13 DIAGNOSIS — I2510 Atherosclerotic heart disease of native coronary artery without angina pectoris: Secondary | ICD-10-CM

## 2023-09-13 DIAGNOSIS — I252 Old myocardial infarction: Secondary | ICD-10-CM

## 2023-09-13 DIAGNOSIS — I951 Orthostatic hypotension: Secondary | ICD-10-CM

## 2023-09-13 DIAGNOSIS — R42 Dizziness and giddiness: Secondary | ICD-10-CM

## 2023-09-13 DIAGNOSIS — I10 Essential (primary) hypertension: Secondary | ICD-10-CM

## 2023-09-13 DIAGNOSIS — I480 Paroxysmal atrial fibrillation: Secondary | ICD-10-CM | POA: Diagnosis not present

## 2023-09-13 DIAGNOSIS — R0602 Shortness of breath: Secondary | ICD-10-CM

## 2023-09-13 DIAGNOSIS — I5042 Chronic combined systolic (congestive) and diastolic (congestive) heart failure: Secondary | ICD-10-CM

## 2023-09-13 DIAGNOSIS — N179 Acute kidney failure, unspecified: Secondary | ICD-10-CM

## 2023-09-13 MED ORDER — ENTRESTO 24-26 MG PO TABS: 1.0000 | ORAL_TABLET | Freq: Two times a day (BID) | ORAL | 2 refills | Status: DC

## 2023-09-13 MED ORDER — AMIODARONE HCL 200 MG PO TABS: 200.0000 mg | ORAL_TABLET | Freq: Two times a day (BID) | ORAL | 3 refills | Status: DC

## 2023-09-13 NOTE — Progress Notes (Signed)
 Cardiology Office Note   Date:  09/13/2023   ID:  Thomas Moore, DOB 08-11-1955, MRN [id number]  PCP:  Lauro Regulus, MD  Cardiologist:  Adrian Blackwater, MD      History of Present Illness: Thomas Moore is a 68 y.o. male who presents for  Chief Complaint  Patient presents with   Follow-up    NST follow up     Larey Seat and has bruise on right upper arm      Past Medical History:  Diagnosis Date   CAD (coronary artery disease) of artery bypass graft    s/p CABG x 4 in 1997   CHF (congestive heart failure) (HCC)    COPD (chronic obstructive pulmonary disease) (HCC)    Coronary artery disease    CVA (cerebral vascular accident) (HCC)    Diabetes mellitus without complication (HCC)    Dysrhythmia    GERD (gastroesophageal reflux disease)    HLD (hyperlipidemia)    Hypertension    S/P TAVR (transcatheter aortic valve replacement) 07/05/2021   with Edwards 26mm S3UR via TF approach with Dr. Excell Seltzer and Dr. Laneta Simmers   Seizures [facility])      Past Surgical History:  Procedure Laterality Date   BYPASS GRAFT ANGIOGRAPHY N/A 04/25/2021   Procedure: BYPASS GRAFT ANGIOGRAPHY;  Surgeon: Armando Reichert, MD;  Location: [facility] INVASIVE CV LAB;  Service: Cardiovascular;  Laterality: N/A;   CARDIAC SURGERY     CORNEAL TRANSPLANT Right    CORONARY ARTERY BYPASS GRAFT  1997   ELECTROPHYSIOLOGY STUDY N/A 09/08/2021   Procedure: ELECTROPHYSIOLOGY STUDY;  Surgeon: Lanier Prude, MD;  Location: [facility] INVASIVE CV LAB;  Service: Cardiovascular;  Laterality: N/A;   EYE SURGERY     FLEXOR TENDON REPAIR Left 07/11/2019   Procedure: FLEXOR tenolysis  REPAIR LEFT RING FINGER with tednon repair;  Surgeon: Kennedy Bucker, MD;  Location: [facility] ORS;  Service: Orthopedics;  Laterality: Left;   INCISION AND DRAINAGE ABSCESS Left 05/08/2019   Procedure: INCISION AND DRAINAGE ABSCESS;  Surgeon: Donato Heinz, MD;  Location: [facility] ORS;  Service: Orthopedics;  Laterality: Left;   INTRAOPERATIVE  TRANSTHORACIC ECHOCARDIOGRAM N/A 07/05/2021   Procedure: INTRAOPERATIVE TRANSTHORACIC ECHOCARDIOGRAM;  Surgeon: Tonny Bollman, MD;  Location: [facility] OR;  Service: Open Heart Surgery;  Laterality: N/A;   LEFT HEART CATH AND CORONARY ANGIOGRAPHY Left 06/09/2022   Procedure: LEFT HEART CATH AND CORONARY ANGIOGRAPHY;  Surgeon: Laurier Nancy, MD;  Location: [facility] INVASIVE CV LAB;  Service: Cardiovascular;  Laterality: Left;   LEFT HEART CATH AND CORS/GRAFTS ANGIOGRAPHY N/A 10/02/2019   Procedure: LEFT HEART CATH AND CORS/GRAFTS ANGIOGRAPHY;  Surgeon: Marykay Lex, MD;  Location: [facility] INVASIVE CV LAB;  Service: Cardiovascular;  Laterality: N/A;   LEFT HEART CATH AND CORS/GRAFTS ANGIOGRAPHY N/A 07/17/2022   Procedure: LEFT HEART CATH AND CORS/GRAFTS ANGIOGRAPHY and possible PCI and stent;  Surgeon: Laurier Nancy, MD;  Location: [facility] INVASIVE CV LAB;  Service: Cardiovascular;  Laterality: N/A;   LOOP RECORDER INSERTION N/A 10/06/2019   Procedure: LOOP RECORDER INSERTION;  Surgeon: Marinus Maw, MD;  Location: [facility] INVASIVE CV LAB;  Service: Cardiovascular;  Laterality: N/A;   TRANSCATHETER AORTIC VALVE REPLACEMENT, TRANSFEMORAL N/A 07/05/2021   Procedure: TRANSCATHETER AORTIC VALVE REPLACEMENT, TRANSFEMORAL;  Surgeon: Tonny Bollman, MD;  Location: [facility] OR;  Service: Open Heart Surgery;  Laterality: N/A;   TRIGGER FINGER RELEASE Left      Current Outpatient Medications  Medication Sig Dispense Refill   sacubitril-valsartan (ENTRESTO) 24-26 MG  Take 1 tablet by mouth 2 (two) times daily. 60 tablet 2   acetaminophen (TYLENOL) 650 MG CR tablet Take 1,300 mg by mouth every 8 (eight) hours as needed for pain.     amiodarone (PACERONE) 200 MG tablet Take 1 tablet (200 mg total) by mouth daily. 30 tablet 3   apixaban (ELIQUIS) 5 MG TABS tablet Take 1 tablet (5 mg total) by mouth 2 (two) times daily. 180 tablet 1   atorvastatin (LIPITOR) 80 MG tablet Take 1 tablet (80 mg total) by mouth at bedtime. 30 tablet 0    benzonatate (TESSALON PERLES) 100 MG capsule Take 1 capsule (100 mg total) by mouth 3 (three) times daily as needed for cough. 20 capsule 0   Calcium Citrate-Vitamin D (CALCIUM + D PO) Take 1 tablet by mouth 2 (two) times daily.     clopidogrel (PLAVIX) 75 MG tablet Take 1 tablet (75 mg total) by mouth daily. 30 tablet 11   divalproex (DEPAKOTE ER) 250 MG 24 hr tablet Take 1 tablet (250 mg total) by mouth at bedtime. 30 tablet 0   Dulaglutide 4.5 MG/0.5ML SOAJ Inject 4.5 mg into the skin once a week.     furosemide (LASIX) 20 MG tablet Take 1 tablet (20 mg total) by mouth daily. 30 tablet 1   insulin degludec (TRESIBA) 100 UNIT/ML FlexTouch Pen Inject 10 Units into the skin daily.     isosorbide mononitrate (IMDUR) 30 MG 24 hr tablet Take 1/2 tab dialy 30 tablet 1   JARDIANCE 25 MG TABS tablet Take 25 mg by mouth daily.     metoprolol succinate (TOPROL-XL) 25 MG 24 hr tablet Take 0.5 tablets (12.5 mg total) by mouth daily. 30 tablet 0   Multiple Vitamin (MULTIVITAMIN) capsule Take 1 capsule by mouth daily.     nitroGLYCERIN (NITROSTAT) 0.4 MG SL tablet Place 1 tablet (0.4 mg total) under the tongue every 5 (five) minutes as needed for chest pain. 30 tablet 2   No current facility-administered medications for this visit.    Allergies:   Patient has no known allergies.    Social History:   reports that he quit smoking about 41 years ago. His smoking use included cigarettes. He has never used smokeless tobacco. He reports that he does not currently use alcohol. He reports that he does not use drugs.   Family History:  family history includes Seizures in his brother.    ROS:     Review of Systems  Constitutional: Negative.   HENT: Negative.    Eyes: Negative.   Respiratory: Negative.    Gastrointestinal: Negative.   Genitourinary: Negative.   Musculoskeletal: Negative.   Skin: Negative.   Neurological: Negative.   Endo/Heme/Allergies: Negative.   Psychiatric/Behavioral: Negative.     All other systems reviewed and are negative.     All other systems are reviewed and negative.    PHYSICAL EXAM: VS:  BP (!) 141/87   Pulse 100   Wt 134 lb (60.8 kg)   SpO2 98%   BMI 23.74 kg/m  , BMI Body mass index is 23.74 kg/m. Last weight:  Wt Readings from Last 3 Encounters:  09/13/23 134 lb (60.8 kg)  08/13/23 132 lb (59.9 kg)  08/04/23 134 lb (60.8 kg)     Physical Exam Vitals reviewed.  Constitutional:      Appearance: Normal appearance. He is normal weight.  HENT:     Head: Normocephalic.     Nose: Nose normal.     Mouth/Throat:  Mouth: Mucous membranes are moist.  Eyes:     Pupils: Pupils are equal, round, and reactive to light.  Cardiovascular:     Rate and Rhythm: Normal rate and regular rhythm.     Pulses: Normal pulses.     Heart sounds: Normal heart sounds.  Pulmonary:     Effort: Pulmonary effort is normal.  Abdominal:     General: Abdomen is flat. Bowel sounds are normal.  Musculoskeletal:        General: Normal range of motion.     Cervical back: Normal range of motion.  Skin:    General: Skin is warm.  Neurological:     General: No focal deficit present.     Mental Status: He is alert.  Psychiatric:        Mood and Affect: Mood normal.       EKG:   Recent Labs: 01/15/2023: ALT 14 06/23/2023: B Natriuretic Peptide 842.2 08/05/2023: BUN 23; Creatinine, Ser 1.31; Potassium 3.6; Sodium 136 08/06/2023: Hemoglobin 12.3; Platelets 125    Lipid Panel    Component Value Date/Time   CHOL 196 08/05/2023 0409   CHOL 143 02/25/2020 1126   TRIG 115 08/05/2023 0409   HDL 42 08/05/2023 0409   HDL 31 (L) 02/25/2020 1126   CHOLHDL 4.7 08/05/2023 0409   VLDL 23 08/05/2023 0409   LDLCALC 131 (H) 08/05/2023 0409   LDLCALC 78 02/25/2020 1126      Other studies Reviewed: Additional studies/ records that were reviewed today include:  Review of the above records demonstrates:       No data to display            ASSESSMENT AND  PLAN:    ICD-10-CM   1. Paroxysmal atrial fibrillation (HCC)  I48.0 sacubitril-valsartan (ENTRESTO) 24-26 MG    2. SOB (shortness of breath)  R06.02 sacubitril-valsartan (ENTRESTO) 24-26 MG    3. Essential hypertension  I10 sacubitril-valsartan (ENTRESTO) 24-26 MG    4. Coronary artery disease with hx of myocardial infarct w/o hx of CABG  I25.10 sacubitril-valsartan (ENTRESTO) 24-26 MG   I25.2    stress test showed mild ischaemia LAD with severe LV dysfunction and was in afib. Treat medically as has no chest pains.    5. Chronic combined systolic and diastolic congestive heart failure (HCC)  I50.42 sacubitril-valsartan (ENTRESTO) 24-26 MG   LVEF 33% on echo, stress test add entresto       Problem List Items Addressed This Visit       Cardiovascular and Mediastinum   Essential hypertension   Relevant Medications   sacubitril-valsartan (ENTRESTO) 24-26 MG   Coronary artery disease with hx of myocardial infarct w/o hx of CABG   Relevant Medications   sacubitril-valsartan (ENTRESTO) 24-26 MG   Paroxysmal atrial fibrillation (HCC) - Primary   Relevant Medications   sacubitril-valsartan (ENTRESTO) 24-26 MG   Chronic combined systolic and diastolic congestive heart failure (HCC)   Relevant Medications   sacubitril-valsartan (ENTRESTO) 24-26 MG     Other   SOB (shortness of breath)   Relevant Medications   sacubitril-valsartan (ENTRESTO) 24-26 MG       Disposition:   Return in about 4 weeks (around 10/11/2023).    Total time spent: 45 minutes  Signed,  Adrian Blackwater, MD  09/13/2023 11:01 AM    [facility]

## 2023-09-13 NOTE — Addendum Note (Signed)
 Addended by: Adrian Blackwater A on: 09/13/2023 11:05 AM   Modules accepted: Orders

## 2023-09-17 ENCOUNTER — Emergency Department

## 2023-09-17 ENCOUNTER — Other Ambulatory Visit: Payer: Self-pay

## 2023-09-17 ENCOUNTER — Emergency Department
Admission: EM | Admit: 2023-09-17 | Discharge: 2023-09-17 | Disposition: A | Discharge status: Home / Self Care | Attending: Emergency Medicine | Admitting: Emergency Medicine

## 2023-09-17 DIAGNOSIS — R7989 Other specified abnormal findings of blood chemistry: Secondary | ICD-10-CM | POA: Insufficient documentation

## 2023-09-17 DIAGNOSIS — I2510 Atherosclerotic heart disease of native coronary artery without angina pectoris: Secondary | ICD-10-CM | POA: Diagnosis not present

## 2023-09-17 DIAGNOSIS — Z952 Presence of prosthetic heart valve: Secondary | ICD-10-CM | POA: Insufficient documentation

## 2023-09-17 DIAGNOSIS — M79602 Pain in left arm: Secondary | ICD-10-CM | POA: Insufficient documentation

## 2023-09-17 DIAGNOSIS — Z7901 Long term (current) use of anticoagulants: Secondary | ICD-10-CM | POA: Diagnosis not present

## 2023-09-17 DIAGNOSIS — I509 Heart failure, unspecified: Secondary | ICD-10-CM | POA: Diagnosis not present

## 2023-09-17 DIAGNOSIS — W19XXXA Unspecified fall, initial encounter: Secondary | ICD-10-CM | POA: Diagnosis not present

## 2023-09-17 DIAGNOSIS — I480 Paroxysmal atrial fibrillation: Secondary | ICD-10-CM | POA: Diagnosis not present

## 2023-09-17 LAB — CBC
HCT: 35.6 % — ABNORMAL LOW (ref 39.0–52.0)
Hemoglobin: 11.1 g/dL — ABNORMAL LOW (ref 13.0–17.0)
MCH: 27.2 pg (ref 26.0–34.0)
MCHC: 31.2 g/dL (ref 30.0–36.0)
MCV: 87.3 fL (ref 80.0–100.0)
Platelets: 150 10*3/uL (ref 150–400)
RBC: 4.08 MIL/uL — ABNORMAL LOW (ref 4.22–5.81)
RDW: 17.3 % — ABNORMAL HIGH (ref 11.5–15.5)
WBC: 5.7 10*3/uL (ref 4.0–10.5)
nRBC: 0 % (ref 0.0–0.2)

## 2023-09-17 LAB — BASIC METABOLIC PANEL WITH GFR
Anion gap: 10 (ref 5–15)
BUN: 27 mg/dL — ABNORMAL HIGH (ref 8–23)
CO2: 25 mmol/L (ref 22–32)
Calcium: 8.7 mg/dL — ABNORMAL LOW (ref 8.9–10.3)
Chloride: 106 mmol/L (ref 98–111)
Creatinine, Ser: 1.28 mg/dL — ABNORMAL HIGH (ref 0.61–1.24)
GFR, Estimated: >60 mL/min (ref >60)
Glucose, Bld: 150 mg/dL — ABNORMAL HIGH (ref 70–99)
Potassium: 4.3 mmol/L (ref 3.5–5.1)
Sodium: 141 mmol/L (ref 135–145)

## 2023-09-17 LAB — PROTIME-INR
INR: 1.3 — ABNORMAL HIGH (ref 0.8–1.2)
Prothrombin Time: 16.4 s — ABNORMAL HIGH (ref 11.4–15.2)

## 2023-09-17 LAB — BRAIN NATRIURETIC PEPTIDE: B Natriuretic Peptide: 814.6 pg/mL — ABNORMAL HIGH (ref 0.0–100.0)

## 2023-09-17 LAB — TROPONIN I (HIGH SENSITIVITY)
Troponin I (High Sensitivity): 20 ng/L — ABNORMAL HIGH (ref <18)
Troponin I (High Sensitivity): 31 ng/L — ABNORMAL HIGH (ref <18)

## 2023-09-17 NOTE — ED Provider Triage Note (Signed)
 Emergency Medicine Provider Triage Evaluation Note  Thomas Moore , a 68 y.o. male  was evaluated in triage.  Pt complains of left arm pain started today.  States since how his other heart attack started.  Also had stress test last week that showed a blockage.  His ejection fraction was low per his cardiologist.  Thomas Moore a nitro prior to arrival which helped just a little bit but not completely.  Review of Systems  Positive:  Negative:   Physical Exam  BP (!) 147/86 (BP Location: Right Arm)   Pulse (!) 115   Temp 99.1 F (37.3 C)   Resp 18   Ht 5\' 3"  (1.6 m)   Wt 60 kg   SpO2 98%   BMI 23.43 kg/m  Gen:   Awake, no distress   Resp:  Normal effort  MSK:   Moves extremities without difficulty  Other:    Medical Decision Making  Medically screening exam initiated at 1:49 PM.  Appropriate orders placed.  Thomas Moore was informed that the remainder of the evaluation will be completed by another provider, this initial triage assessment does not replace that evaluation, and the importance of remaining in the ED until their evaluation is complete.     Thomas Figures, PA-C 09/17/23 1350

## 2023-09-17 NOTE — ED Provider Notes (Signed)
 [facility] Provider Note    Event Date/Time   First MD Initiated Contact with Patient 09/17/23 1525     (approximate)   History   Arm Pain   HPI  Thomas Moore is a 68 y.o. male history of CAD, paroxysmal atrial fibrillation on Eliquis, GERD, presenting with left arm pain.  Patient states that his anginal symptoms are always arm aching and not chest pain.  He denies any shortness of breath, cough, leg swelling, fever, nausea, vomiting.  States that has been taking his medications as prescribed.  Had a normal stress test earlier this month.  Also states that he had a fall in his bathroom and hit his left upper arm against the bathtub on Monday last week.  No upper arm pain.   On independent review, he was admitted in March for ACS, his initial troponin at time was elevated with positive delta Trope concerning for NSTEMI, he was transition from heparin to Eliquis, continue on Plavix and Imdur, an echo that was done that showed 30 to 35% EF with grade 3 diastolic dysfunction, was discharged with cardiology follow-up.    Physical Exam   Triage Vital Signs: ED Triage Vitals [09/17/23 1348]  Encounter Vitals Group     BP (!) 147/86     Systolic BP Percentile      Diastolic BP Percentile      Pulse Rate (!) 115     Resp 18     Temp 99.1 F (37.3 C)     Temp src      SpO2 98 %     Weight 132 lb 4.4 oz (60 kg)     Height 5\' 3"  (1.6 m)     Head Circumference      Peak Flow      Pain Score 5     Pain Loc      Pain Education      Exclude from Growth Chart     Most recent vital signs: Vitals:   09/17/23 1348 09/17/23 1600  BP: (!) 147/86 (!) 141/71  Pulse: (!) 115 85  Resp: 18 19  Temp: 99.1 F (37.3 C)   SpO2: 98% 99%     General: Awake, no distress.  CV:  Good peripheral perfusion.  Resp:  Normal effort.  Clear Abd:  No distention.  Soft nontender Other:  No lower extremity edema.  No unilateral calf swelling or tenderness.  He does have  ecchymoses to the mid to distal left humerus without any bony tenderness, full range of motion of upper extremities are intact, equal radial pulses bilaterally.  He also has a palpable nodule on his left bicep.  Nontender to palpation, no overlying erythema, there is an overlying resolving ecchymoses.  No drainage.  No open wounds.  No other ecchymoses or thoracic cage tenderness.  ED Results / Procedures / Treatments   Labs (all labs ordered are listed, but only abnormal results are displayed) Labs Reviewed  BASIC METABOLIC PANEL WITH GFR - Abnormal; Notable for the following components:      Result Value   Glucose, Bld 150 (*)    BUN 27 (*)    Creatinine, Ser 1.28 (*)    Calcium 8.7 (*)    All other components within normal limits  CBC - Abnormal; Notable for the following components:   RBC 4.08 (*)    Hemoglobin 11.1 (*)    HCT 35.6 (*)    RDW 17.3 (*)  All other components within normal limits  PROTIME-INR - Abnormal; Notable for the following components:   Prothrombin Time 16.4 (*)    INR 1.3 (*)    All other components within normal limits  BRAIN NATRIURETIC PEPTIDE - Abnormal; Notable for the following components:   B Natriuretic Peptide 814.6 (*)    All other components within normal limits  TROPONIN I (HIGH SENSITIVITY) - Abnormal; Notable for the following components:   Troponin I (High Sensitivity) 20 (*)    All other components within normal limits  TROPONIN I (HIGH SENSITIVITY) - Abnormal; Notable for the following components:   Troponin I (High Sensitivity) 31 (*)    All other components within normal limits     EKG  EKG showed atrial fibrillation with PVCs, rate 104, normal QRS, normal QTc, no ischemic ST elevation, T wave inversion to inferior lateral leads, this is changed compared to prior since prior EKG shows sinus rhythm   RADIOLOGY Chest x-ray on my independent interpretation without obvious consolidation.   PROCEDURES:  Critical Care performed:  No  Procedures   MEDICATIONS ORDERED IN ED: Medications - No data to display   IMPRESSION / MDM / ASSESSMENT AND PLAN / ED COURSE  I reviewed the triage vital signs and the nursing notes.                              Differential diagnosis includes, but is not limited to,, ACS, contusion, strain, not volume overload to suggest CHF, no infectious symptoms suggest pneumonia, viral illness.  Patient is asymptomatic at this time.  Will get labs, EKG, troponin, chest x-ray.  Reassess.  Cardiology consult.  Patient's presentation is most consistent with acute presentation with potential threat to life or bodily function.  Independent review of labs and imaging are below.  Consultation with cardiology below.  On reassessment patient has no recurrence of any chest pain or arm pain.  Shared decision making done with patient and he is agreeable plan for discharge and outpatient follow-up with cardiology, will go to his cardiologist appointment tomorrow at 10:30 AM.  Considered but no indication for inpatient admission at this time, he is safe for outpatient management.  Will discharge with strict return precautions.  Clinical Course as of 09/17/23 1910  Mon Sep 17, 2023  1552 [facility] cardiology who says that if patient symptoms have resolved, no recurrent chest pain or arm pain, troponins are stable and downtrending, okay to follow-up with him outpatient.  Cardiology can see him tomorrow in his office at 10:30 AM states that if he needs a left heart cath again was scheduled outpatient. [TT]  1726 DG Humerus Left No acute osseous abnormality.  [TT]  1726 DG Chest 2 View IMPRESSION: Postop chest with hyperinflation.  Status post TAVR.  Loop recorder.  Tiny pleural effusions versus pleural thickening.   [TT]  1909 Independent review of labs, no leukocytosis, other stress not severely deranged, creatinine is mildly elevated but has been elevated in the past, his BNP is elevated but he does not  appear fluid overload at this time, it is consistent compared to prior, his troponin is mildly elevated, delta Trope was less than 15. [TT]    Clinical Course User Index [TT] Jodie Echevaria, Franchot Erichsen, MD     FINAL CLINICAL IMPRESSION(S) / ED DIAGNOSES   Final diagnoses:  Left arm pain     Rx / DC Orders   ED Discharge Orders  None        Note:  This document was prepared using Dragon voice recognition software and may include unintentional dictation errors.    Shane Darling, MD 09/17/23 1910

## 2023-09-17 NOTE — Discharge Instructions (Signed)
 Please go to your cardiologist appointment at 10:30 AM tomorrow with Dr. Meredeth Stallion to follow-up.

## 2023-09-17 NOTE — ED Triage Notes (Addendum)
 Pt states pain to L arm, pt states HX of MI's pt states never had CP but arm pain. NAD noted.   Pt also have injury to L upper arm, pt states this is from a fall.

## 2023-09-18 ENCOUNTER — Ambulatory Visit: Admitting: Cardiovascular Disease

## 2023-09-18 ENCOUNTER — Other Ambulatory Visit (INDEPENDENT_AMBULATORY_CARE_PROVIDER_SITE_OTHER): Admitting: Cardiovascular Disease

## 2023-09-18 ENCOUNTER — Encounter: Payer: Self-pay | Admitting: Cardiovascular Disease

## 2023-09-18 ENCOUNTER — Ambulatory Visit: Payer: Self-pay | Admitting: Cardiovascular Disease

## 2023-09-18 VITALS — BP 133/73 | HR 85 | Ht 63.0 in | Wt 136.0 lb

## 2023-09-18 DIAGNOSIS — I5042 Chronic combined systolic (congestive) and diastolic (congestive) heart failure: Secondary | ICD-10-CM

## 2023-09-18 DIAGNOSIS — R0789 Other chest pain: Secondary | ICD-10-CM

## 2023-09-18 DIAGNOSIS — I25700 Atherosclerosis of coronary artery bypass graft(s), unspecified, with unstable angina pectoris: Secondary | ICD-10-CM | POA: Diagnosis not present

## 2023-09-18 DIAGNOSIS — R0602 Shortness of breath: Secondary | ICD-10-CM

## 2023-09-18 DIAGNOSIS — I252 Old myocardial infarction: Secondary | ICD-10-CM

## 2023-09-18 DIAGNOSIS — Z0130 Encounter for examination of blood pressure without abnormal findings: Secondary | ICD-10-CM

## 2023-09-18 DIAGNOSIS — R42 Dizziness and giddiness: Secondary | ICD-10-CM

## 2023-09-18 DIAGNOSIS — R9439 Abnormal result of other cardiovascular function study: Secondary | ICD-10-CM

## 2023-09-18 DIAGNOSIS — I2510 Atherosclerotic heart disease of native coronary artery without angina pectoris: Secondary | ICD-10-CM | POA: Diagnosis not present

## 2023-09-18 MED ORDER — SODIUM CHLORIDE 0.9% FLUSH: 3.0000 mL | Freq: Two times a day (BID) | INTRAVENOUS | Status: AC

## 2023-09-18 NOTE — Progress Notes (Signed)
 Cardiology Office Note   Date:  09/18/2023   ID:  Thomas Moore, DOB 10/04/1955, MRN [id number]  PCP:  Lauro Regulus, MD  Cardiologist:  Adrian Blackwater, MD      History of Present Illness: Thomas Moore is a 68 y.o. male who presents for No chief complaint on file.   HPI    Past Medical History:  Diagnosis Date   CAD (coronary artery disease) of artery bypass graft    s/p CABG x 4 in 1997   CHF (congestive heart failure) (HCC)    COPD (chronic obstructive pulmonary disease) (HCC)    Coronary artery disease    CVA (cerebral vascular accident) (HCC)    Diabetes mellitus without complication (HCC)    Dysrhythmia    GERD (gastroesophageal reflux disease)    HLD (hyperlipidemia)    Hypertension    S/P TAVR (transcatheter aortic valve replacement) 07/05/2021   with Edwards 26mm S3UR via TF approach with Dr. Excell Seltzer and Dr. Laneta Simmers   Seizures [facility])      Past Surgical History:  Procedure Laterality Date   BYPASS GRAFT ANGIOGRAPHY N/A 04/25/2021   Procedure: BYPASS GRAFT ANGIOGRAPHY;  Surgeon: Armando Reichert, MD;  Location: [facility] INVASIVE CV LAB;  Service: Cardiovascular;  Laterality: N/A;   CARDIAC SURGERY     CORNEAL TRANSPLANT Right    CORONARY ARTERY BYPASS GRAFT  1997   ELECTROPHYSIOLOGY STUDY N/A 09/08/2021   Procedure: ELECTROPHYSIOLOGY STUDY;  Surgeon: Lanier Prude, MD;  Location: [facility] INVASIVE CV LAB;  Service: Cardiovascular;  Laterality: N/A;   EYE SURGERY     FLEXOR TENDON REPAIR Left 07/11/2019   Procedure: FLEXOR tenolysis  REPAIR LEFT RING FINGER with tednon repair;  Surgeon: Kennedy Bucker, MD;  Location: [facility] ORS;  Service: Orthopedics;  Laterality: Left;   INCISION AND DRAINAGE ABSCESS Left 05/08/2019   Procedure: INCISION AND DRAINAGE ABSCESS;  Surgeon: Donato Heinz, MD;  Location: [facility] ORS;  Service: Orthopedics;  Laterality: Left;   INTRAOPERATIVE TRANSTHORACIC ECHOCARDIOGRAM N/A 07/05/2021   Procedure: INTRAOPERATIVE TRANSTHORACIC  ECHOCARDIOGRAM;  Surgeon: Tonny Bollman, MD;  Location: [facility] OR;  Service: Open Heart Surgery;  Laterality: N/A;   LEFT HEART CATH AND CORONARY ANGIOGRAPHY Left 06/09/2022   Procedure: LEFT HEART CATH AND CORONARY ANGIOGRAPHY;  Surgeon: Laurier Nancy, MD;  Location: [facility] INVASIVE CV LAB;  Service: Cardiovascular;  Laterality: Left;   LEFT HEART CATH AND CORS/GRAFTS ANGIOGRAPHY N/A 10/02/2019   Procedure: LEFT HEART CATH AND CORS/GRAFTS ANGIOGRAPHY;  Surgeon: Marykay Lex, MD;  Location: [facility] INVASIVE CV LAB;  Service: Cardiovascular;  Laterality: N/A;   LEFT HEART CATH AND CORS/GRAFTS ANGIOGRAPHY N/A 07/17/2022   Procedure: LEFT HEART CATH AND CORS/GRAFTS ANGIOGRAPHY and possible PCI and stent;  Surgeon: Laurier Nancy, MD;  Location: [facility] INVASIVE CV LAB;  Service: Cardiovascular;  Laterality: N/A;   LOOP RECORDER INSERTION N/A 10/06/2019   Procedure: LOOP RECORDER INSERTION;  Surgeon: Marinus Maw, MD;  Location: [facility] INVASIVE CV LAB;  Service: Cardiovascular;  Laterality: N/A;   TRANSCATHETER AORTIC VALVE REPLACEMENT, TRANSFEMORAL N/A 07/05/2021   Procedure: TRANSCATHETER AORTIC VALVE REPLACEMENT, TRANSFEMORAL;  Surgeon: Tonny Bollman, MD;  Location: [facility] OR;  Service: Open Heart Surgery;  Laterality: N/A;   TRIGGER FINGER RELEASE Left      Current Outpatient Medications  Medication Sig Dispense Refill   acetaminophen (TYLENOL) 650 MG CR tablet Take 1,300 mg by mouth every 8 (eight) hours as needed for pain.     amiodarone (PACERONE) 200  MG tablet Take 1 tablet (200 mg total) by mouth 2 (two) times daily. 30 tablet 3   apixaban (ELIQUIS) 5 MG TABS tablet Take 1 tablet (5 mg total) by mouth 2 (two) times daily. 180 tablet 1   atorvastatin (LIPITOR) 80 MG tablet Take 1 tablet (80 mg total) by mouth at bedtime. 30 tablet 0   benzonatate (TESSALON PERLES) 100 MG capsule Take 1 capsule (100 mg total) by mouth 3 (three) times daily as needed for cough. 20 capsule 0   Calcium Citrate-Vitamin D  (CALCIUM + D PO) Take 1 tablet by mouth 2 (two) times daily.     clopidogrel (PLAVIX) 75 MG tablet Take 1 tablet (75 mg total) by mouth daily. 30 tablet 11   divalproex (DEPAKOTE ER) 250 MG 24 hr tablet Take 1 tablet (250 mg total) by mouth at bedtime. 30 tablet 0   Dulaglutide 4.5 MG/0.5ML SOAJ Inject 4.5 mg into the skin once a week.     furosemide (LASIX) 20 MG tablet Take 1 tablet (20 mg total) by mouth daily. 30 tablet 1   insulin degludec (TRESIBA) 100 UNIT/ML FlexTouch Pen Inject 10 Units into the skin daily.     isosorbide mononitrate (IMDUR) 30 MG 24 hr tablet Take 1/2 tab dialy 30 tablet 1   JARDIANCE 25 MG TABS tablet Take 25 mg by mouth daily.     metoprolol succinate (TOPROL-XL) 25 MG 24 hr tablet Take 0.5 tablets (12.5 mg total) by mouth daily. 30 tablet 0   Multiple Vitamin (MULTIVITAMIN) capsule Take 1 capsule by mouth daily.     nitroGLYCERIN (NITROSTAT) 0.4 MG SL tablet Place 1 tablet (0.4 mg total) under the tongue every 5 (five) minutes as needed for chest pain. 30 tablet 2   sacubitril-valsartan (ENTRESTO) 24-26 MG Take 1 tablet by mouth 2 (two) times daily. 60 tablet 2   No current facility-administered medications for this visit.   Facility-Administered Medications Ordered in Other Visits  Medication Dose Route Frequency Provider Last Rate Last Admin   sodium chloride flush (NS) 0.9 % injection 3 mL  3 mL Intravenous Q12H Cherrie Cornwall, MD        Allergies:   Patient has no known allergies.    Social History:   reports that he quit smoking about 41 years ago. His smoking use included cigarettes. He has never used smokeless tobacco. He reports that he does not currently use alcohol. He reports that he does not use drugs.   Family History:  family history includes Seizures in his brother.    ROS:     ROS    All other systems are reviewed and negative.    PHYSICAL EXAM: VS:  There were no vitals taken for this visit. , BMI There is no height or weight on file  to calculate BMI. Last weight:  Wt Readings from Last 3 Encounters:  09/17/23 132 lb 4.4 oz (60 kg)  09/13/23 134 lb (60.8 kg)  08/13/23 132 lb (59.9 kg)     Physical Exam    EKG:   Recent Labs: 01/15/2023: ALT 14 09/17/2023: B Natriuretic Peptide 814.6; BUN 27; Creatinine, Ser 1.28; Hemoglobin 11.1; Platelets 150; Potassium 4.3; Sodium 141    Lipid Panel    Component Value Date/Time   CHOL 196 08/05/2023 0409   CHOL 143 02/25/2020 1126   TRIG 115 08/05/2023 0409   HDL 42 08/05/2023 0409   HDL 31 (L) 02/25/2020 1126   CHOLHDL 4.7 08/05/2023 0409   VLDL  23 08/05/2023 0409   LDLCALC 131 (H) 08/05/2023 0409   LDLCALC 78 02/25/2020 1126      Other studies Reviewed: Additional studies/ records that were reviewed today include:  Review of the above records demonstrates:       No data to display            ASSESSMENT AND PLAN:    ICD-10-CM   1. Unstable angina due to arteriosclerosis of coronary artery bypass graft (HCC)  I25.700    Patient was in the emergency room with chest pain, had a abnormal stress test with ischemia and was admitted 6 weeks ago with non-STEMI.  Advised LHC.    2. Coronary artery disease with hx of myocardial infarct w/o hx of CABG  I25.10    I25.2     3. Other chest pain  R07.89    Has recurrent chest pain and has failed medical therapy, thus will do left heart catheterization.    4. Abnormal nuclear stress test  R94.39        Problem List Items Addressed This Visit       Cardiovascular and Mediastinum   Coronary artery disease with hx of myocardial infarct w/o hx of CABG   Unstable angina due to arteriosclerosis of coronary artery bypass graft (HCC) - Primary     Other   Chest pain   Other Visit Diagnoses       Abnormal nuclear stress test              Disposition:   No follow-ups on file.    Total time spent: 50 minutes  Signed,  Debborah Fairly, MD  09/18/2023 10:15 AM    [facility]

## 2023-09-18 NOTE — Progress Notes (Signed)
 Cardiology Office Note   Date:  09/18/2023   ID:  Thomas Moore, DOB 06-Oct-1955, MRN [id number]  PCP:  Lauro Regulus, MD  Cardiologist:  Adrian Blackwater, MD      History of Present Illness: Thomas Moore is a 68 y.o. male who presents for  Chief Complaint  Patient presents with   Follow-up    Hospital Follow up     [facility] to ER with chest pains, radiating to left arm, relieved with nitro. Troponins were negative. Sent for evaluation as had NSTEMI, last month and was being treated medically but now since it failed, will set up cath.      Past Medical History:  Diagnosis Date   CAD (coronary artery disease) of artery bypass graft    s/p CABG x 4 in 1997   CHF (congestive heart failure) (HCC)    COPD (chronic obstructive pulmonary disease) (HCC)    Coronary artery disease    CVA (cerebral vascular accident) (HCC)    Diabetes mellitus without complication (HCC)    Dysrhythmia    GERD (gastroesophageal reflux disease)    HLD (hyperlipidemia)    Hypertension    S/P TAVR (transcatheter aortic valve replacement) 07/05/2021   with Edwards 26mm S3UR via TF approach with Dr. Excell Seltzer and Dr. Laneta Simmers   Seizures [facility])      Past Surgical History:  Procedure Laterality Date   BYPASS GRAFT ANGIOGRAPHY N/A 04/25/2021   Procedure: BYPASS GRAFT ANGIOGRAPHY;  Surgeon: Armando Reichert, MD;  Location: [facility] INVASIVE CV LAB;  Service: Cardiovascular;  Laterality: N/A;   CARDIAC SURGERY     CORNEAL TRANSPLANT Right    CORONARY ARTERY BYPASS GRAFT  1997   ELECTROPHYSIOLOGY STUDY N/A 09/08/2021   Procedure: ELECTROPHYSIOLOGY STUDY;  Surgeon: Lanier Prude, MD;  Location: [facility] INVASIVE CV LAB;  Service: Cardiovascular;  Laterality: N/A;   EYE SURGERY     FLEXOR TENDON REPAIR Left 07/11/2019   Procedure: FLEXOR tenolysis  REPAIR LEFT RING FINGER with tednon repair;  Surgeon: Kennedy Bucker, MD;  Location: [facility] ORS;  Service: Orthopedics;  Laterality: Left;   INCISION AND DRAINAGE  ABSCESS Left 05/08/2019   Procedure: INCISION AND DRAINAGE ABSCESS;  Surgeon: Donato Heinz, MD;  Location: [facility] ORS;  Service: Orthopedics;  Laterality: Left;   INTRAOPERATIVE TRANSTHORACIC ECHOCARDIOGRAM N/A 07/05/2021   Procedure: INTRAOPERATIVE TRANSTHORACIC ECHOCARDIOGRAM;  Surgeon: Tonny Bollman, MD;  Location: [facility] OR;  Service: Open Heart Surgery;  Laterality: N/A;   LEFT HEART CATH AND CORONARY ANGIOGRAPHY Left 06/09/2022   Procedure: LEFT HEART CATH AND CORONARY ANGIOGRAPHY;  Surgeon: Laurier Nancy, MD;  Location: [facility] INVASIVE CV LAB;  Service: Cardiovascular;  Laterality: Left;   LEFT HEART CATH AND CORS/GRAFTS ANGIOGRAPHY N/A 10/02/2019   Procedure: LEFT HEART CATH AND CORS/GRAFTS ANGIOGRAPHY;  Surgeon: Marykay Lex, MD;  Location: [facility] INVASIVE CV LAB;  Service: Cardiovascular;  Laterality: N/A;   LEFT HEART CATH AND CORS/GRAFTS ANGIOGRAPHY N/A 07/17/2022   Procedure: LEFT HEART CATH AND CORS/GRAFTS ANGIOGRAPHY and possible PCI and stent;  Surgeon: Laurier Nancy, MD;  Location: [facility] INVASIVE CV LAB;  Service: Cardiovascular;  Laterality: N/A;   LOOP RECORDER INSERTION N/A 10/06/2019   Procedure: LOOP RECORDER INSERTION;  Surgeon: Marinus Maw, MD;  Location: [facility] INVASIVE CV LAB;  Service: Cardiovascular;  Laterality: N/A;   TRANSCATHETER AORTIC VALVE REPLACEMENT, TRANSFEMORAL N/A 07/05/2021   Procedure: TRANSCATHETER AORTIC VALVE REPLACEMENT, TRANSFEMORAL;  Surgeon: Tonny Bollman, MD;  Location: [facility] OR;  Service: Open  Heart Surgery;  Laterality: N/A;   TRIGGER FINGER RELEASE Left      Current Outpatient Medications  Medication Sig Dispense Refill   acetaminophen (TYLENOL) 650 MG CR tablet Take 1,300 mg by mouth every 8 (eight) hours as needed for pain.     amiodarone (PACERONE) 200 MG tablet Take 1 tablet (200 mg total) by mouth 2 (two) times daily. 30 tablet 3   apixaban (ELIQUIS) 5 MG TABS tablet Take 1 tablet (5 mg total) by mouth 2 (two) times daily. 180 tablet 1    atorvastatin (LIPITOR) 80 MG tablet Take 1 tablet (80 mg total) by mouth at bedtime. 30 tablet 0   benzonatate (TESSALON PERLES) 100 MG capsule Take 1 capsule (100 mg total) by mouth 3 (three) times daily as needed for cough. 20 capsule 0   Calcium Citrate-Vitamin D (CALCIUM + D PO) Take 1 tablet by mouth 2 (two) times daily.     clopidogrel (PLAVIX) 75 MG tablet Take 1 tablet (75 mg total) by mouth daily. 30 tablet 11   divalproex (DEPAKOTE ER) 250 MG 24 hr tablet Take 1 tablet (250 mg total) by mouth at bedtime. 30 tablet 0   Dulaglutide 4.5 MG/0.5ML SOAJ Inject 4.5 mg into the skin once a week.     furosemide (LASIX) 20 MG tablet Take 1 tablet (20 mg total) by mouth daily. 30 tablet 1   insulin degludec (TRESIBA) 100 UNIT/ML FlexTouch Pen Inject 10 Units into the skin daily.     isosorbide mononitrate (IMDUR) 30 MG 24 hr tablet Take 1/2 tab dialy 30 tablet 1   JARDIANCE 25 MG TABS tablet Take 25 mg by mouth daily.     metoprolol succinate (TOPROL-XL) 25 MG 24 hr tablet Take 0.5 tablets (12.5 mg total) by mouth daily. 30 tablet 0   Multiple Vitamin (MULTIVITAMIN) capsule Take 1 capsule by mouth daily.     nitroGLYCERIN (NITROSTAT) 0.4 MG SL tablet Place 1 tablet (0.4 mg total) under the tongue every 5 (five) minutes as needed for chest pain. 30 tablet 2   sacubitril-valsartan (ENTRESTO) 24-26 MG Take 1 tablet by mouth 2 (two) times daily. 60 tablet 2   No current facility-administered medications for this visit.   Facility-Administered Medications Ordered in Other Visits  Medication Dose Route Frequency Provider Last Rate Last Admin   sodium chloride flush (NS) 0.9 % injection 3 mL  3 mL Intravenous Q12H Laurier Nancy, MD        Allergies:   Patient has no known allergies.    Social History:   reports that he quit smoking about 41 years ago. His smoking use included cigarettes. He has never used smokeless tobacco. He reports that he does not currently use alcohol. He reports that he does  not use drugs.   Family History:  family history includes Seizures in his brother.    ROS:     Review of Systems  Constitutional: Negative.   HENT: Negative.    Eyes: Negative.   Respiratory: Negative.    Gastrointestinal: Negative.   Genitourinary: Negative.   Musculoskeletal: Negative.   Skin: Negative.   Neurological: Negative.   Endo/Heme/Allergies: Negative.   Psychiatric/Behavioral: Negative.    All other systems reviewed and are negative.     All other systems are reviewed and negative.    PHYSICAL EXAM: VS:  BP 133/73   Pulse 85   Ht 5\' 3"  (1.6 m)   Wt 136 lb (61.7 kg)   SpO2 96%  BMI 24.09 kg/m  , BMI Body mass index is 24.09 kg/m. Last weight:  Wt Readings from Last 3 Encounters:  09/18/23 136 lb (61.7 kg)  09/17/23 132 lb 4.4 oz (60 kg)  09/13/23 134 lb (60.8 kg)     Physical Exam Vitals reviewed.  Constitutional:      Appearance: Normal appearance. He is normal weight.  HENT:     Head: Normocephalic.     Nose: Nose normal.     Mouth/Throat:     Mouth: Mucous membranes are moist.  Eyes:     Pupils: Pupils are equal, round, and reactive to light.  Cardiovascular:     Rate and Rhythm: Normal rate and regular rhythm.     Pulses: Normal pulses.     Heart sounds: Normal heart sounds.  Pulmonary:     Effort: Pulmonary effort is normal.  Abdominal:     General: Abdomen is flat. Bowel sounds are normal.  Musculoskeletal:        General: Normal range of motion.     Cervical back: Normal range of motion.  Skin:    General: Skin is warm.  Neurological:     General: No focal deficit present.     Mental Status: He is alert.  Psychiatric:        Mood and Affect: Mood normal.       EKG:   Recent Labs: 01/15/2023: ALT 14 09/17/2023: B Natriuretic Peptide 814.6; BUN 27; Creatinine, Ser 1.28; Hemoglobin 11.1; Platelets 150; Potassium 4.3; Sodium 141    Lipid Panel    Component Value Date/Time   CHOL 196 08/05/2023 0409   CHOL 143  02/25/2020 1126   TRIG 115 08/05/2023 0409   HDL 42 08/05/2023 0409   HDL 31 (L) 02/25/2020 1126   CHOLHDL 4.7 08/05/2023 0409   VLDL 23 08/05/2023 0409   LDLCALC 131 (H) 08/05/2023 0409   LDLCALC 78 02/25/2020 1126      Other studies Reviewed: Additional studies/ records that were reviewed today include:  Review of the above records demonstrates:       No data to display            ASSESSMENT AND PLAN:    ICD-10-CM   1. Other chest pain  R07.89 Procedural/ Surgical Case Request: LEFT HEART CATH AND CORONARY ANGIOGRAPHY with possible coronary intervention    Procedural/ Surgical Case Request: LEFT HEART CATH AND CORONARY ANGIOGRAPHY with possible coronary intervention    2. Dizziness  R42 Procedural/ Surgical Case Request: LEFT HEART CATH AND CORONARY ANGIOGRAPHY with possible coronary intervention    Procedural/ Surgical Case Request: LEFT HEART CATH AND CORONARY ANGIOGRAPHY with possible coronary intervention    3. Chronic combined systolic and diastolic congestive heart failure (HCC)  I50.42 Procedural/ Surgical Case Request: LEFT HEART CATH AND CORONARY ANGIOGRAPHY with possible coronary intervention    Procedural/ Surgical Case Request: LEFT HEART CATH AND CORONARY ANGIOGRAPHY with possible coronary intervention    4. SOB (shortness of breath)  R06.02 Procedural/ Surgical Case Request: LEFT HEART CATH AND CORONARY ANGIOGRAPHY with possible coronary intervention    Procedural/ Surgical Case Request: LEFT HEART CATH AND CORONARY ANGIOGRAPHY with possible coronary intervention    5. Coronary artery disease with hx of myocardial infarct w/o hx of CABG  I25.10 Procedural/ Surgical Case Request: LEFT HEART CATH AND CORONARY ANGIOGRAPHY with possible coronary intervention   I25.2 Procedural/ Surgical Case Request: LEFT HEART CATH AND CORONARY ANGIOGRAPHY with possible coronary intervention    6. Unstable angina due  to arteriosclerosis of coronary artery bypass graft (HCC)   I25.700 Procedural/ Surgical Case Request: LEFT HEART CATH AND CORONARY ANGIOGRAPHY with possible coronary intervention    Procedural/ Surgical Case Request: LEFT HEART CATH AND CORONARY ANGIOGRAPHY with possible coronary intervention   Had ischaemia on stress test, failed medical therapy as had chest pain and went to ER. Advise [facility].    7. Unstable angina due to arteriosclerosis of coronary artery bypass graft (HCC)  I25.700 Procedural/ Surgical Case Request: LEFT HEART CATH AND CORONARY ANGIOGRAPHY with possible coronary intervention    Procedural/ Surgical Case Request: LEFT HEART CATH AND CORONARY ANGIOGRAPHY with possible coronary intervention       Problem List Items Addressed This Visit       Cardiovascular and Mediastinum   Coronary artery disease with hx of myocardial infarct w/o hx of CABG   Relevant Orders   Procedural/ Surgical Case Request: LEFT HEART CATH AND CORONARY ANGIOGRAPHY with possible coronary intervention (Completed)   Chronic combined systolic and diastolic congestive heart failure (HCC)   Relevant Orders   Procedural/ Surgical Case Request: LEFT HEART CATH AND CORONARY ANGIOGRAPHY with possible coronary intervention (Completed)   Unstable angina due to arteriosclerosis of coronary artery bypass graft (HCC)   Relevant Orders   Procedural/ Surgical Case Request: LEFT HEART CATH AND CORONARY ANGIOGRAPHY with possible coronary intervention (Completed)     Other   Chest pain - Primary   Relevant Orders   Procedural/ Surgical Case Request: LEFT HEART CATH AND CORONARY ANGIOGRAPHY with possible coronary intervention (Completed)   Dizziness   Relevant Orders   Procedural/ Surgical Case Request: LEFT HEART CATH AND CORONARY ANGIOGRAPHY with possible coronary intervention (Completed)   SOB (shortness of breath)   Relevant Orders   Procedural/ Surgical Case Request: LEFT HEART CATH AND CORONARY ANGIOGRAPHY with possible coronary intervention (Completed)        Disposition:   Return in about 2 weeks (around 10/02/2023) for set cardiac cath first case at 7 am or 11 am.    Total time spent: 50 minutes  Signed,  Debborah Fairly, MD  09/18/2023 10:16 AM    [facility]

## 2023-09-27 ENCOUNTER — Encounter
Admission: RE | Disposition: A | Discharge status: Home / Self Care | Payer: Self-pay | Source: Home / Self Care | Attending: Internal Medicine

## 2023-09-27 ENCOUNTER — Other Ambulatory Visit: Payer: Self-pay

## 2023-09-27 ENCOUNTER — Encounter: Payer: Self-pay | Admitting: Cardiovascular Disease

## 2023-09-27 ENCOUNTER — Observation Stay
Admission: RE | Admit: 2023-09-27 | Discharge: 2023-09-28 | Disposition: A | Discharge status: Home / Self Care | Attending: Internal Medicine | Admitting: Internal Medicine

## 2023-09-27 DIAGNOSIS — I5022 Chronic systolic (congestive) heart failure: Secondary | ICD-10-CM | POA: Insufficient documentation

## 2023-09-27 DIAGNOSIS — Z87891 Personal history of nicotine dependence: Secondary | ICD-10-CM | POA: Insufficient documentation

## 2023-09-27 DIAGNOSIS — I480 Paroxysmal atrial fibrillation: Secondary | ICD-10-CM | POA: Diagnosis not present

## 2023-09-27 DIAGNOSIS — I2510 Atherosclerotic heart disease of native coronary artery without angina pectoris: Secondary | ICD-10-CM

## 2023-09-27 DIAGNOSIS — R0602 Shortness of breath: Secondary | ICD-10-CM | POA: Insufficient documentation

## 2023-09-27 DIAGNOSIS — E1122 Type 2 diabetes mellitus with diabetic chronic kidney disease: Secondary | ICD-10-CM | POA: Diagnosis not present

## 2023-09-27 DIAGNOSIS — E119 Type 2 diabetes mellitus without complications: Secondary | ICD-10-CM | POA: Diagnosis present

## 2023-09-27 DIAGNOSIS — I10 Essential (primary) hypertension: Secondary | ICD-10-CM | POA: Diagnosis not present

## 2023-09-27 DIAGNOSIS — I5042 Chronic combined systolic (congestive) and diastolic (congestive) heart failure: Secondary | ICD-10-CM | POA: Insufficient documentation

## 2023-09-27 DIAGNOSIS — Z951 Presence of aortocoronary bypass graft: Secondary | ICD-10-CM | POA: Diagnosis not present

## 2023-09-27 DIAGNOSIS — I130 Hypertensive heart and chronic kidney disease with heart failure and stage 1 through stage 4 chronic kidney disease, or unspecified chronic kidney disease: Secondary | ICD-10-CM | POA: Diagnosis not present

## 2023-09-27 DIAGNOSIS — I200 Unstable angina: Secondary | ICD-10-CM | POA: Diagnosis not present

## 2023-09-27 DIAGNOSIS — I25110 Atherosclerotic heart disease of native coronary artery with unstable angina pectoris: Secondary | ICD-10-CM | POA: Diagnosis present

## 2023-09-27 DIAGNOSIS — J449 Chronic obstructive pulmonary disease, unspecified: Secondary | ICD-10-CM | POA: Diagnosis not present

## 2023-09-27 DIAGNOSIS — Z7902 Long term (current) use of antithrombotics/antiplatelets: Secondary | ICD-10-CM | POA: Insufficient documentation

## 2023-09-27 DIAGNOSIS — R42 Dizziness and giddiness: Secondary | ICD-10-CM

## 2023-09-27 DIAGNOSIS — N183 Chronic kidney disease, stage 3 unspecified: Secondary | ICD-10-CM | POA: Insufficient documentation

## 2023-09-27 DIAGNOSIS — E785 Hyperlipidemia, unspecified: Secondary | ICD-10-CM | POA: Insufficient documentation

## 2023-09-27 DIAGNOSIS — Z79899 Other long term (current) drug therapy: Secondary | ICD-10-CM | POA: Diagnosis not present

## 2023-09-27 DIAGNOSIS — I25700 Atherosclerosis of coronary artery bypass graft(s), unspecified, with unstable angina pectoris: Secondary | ICD-10-CM

## 2023-09-27 DIAGNOSIS — Z8673 Personal history of transient ischemic attack (TIA), and cerebral infarction without residual deficits: Secondary | ICD-10-CM | POA: Insufficient documentation

## 2023-09-27 DIAGNOSIS — Z794 Long term (current) use of insulin: Secondary | ICD-10-CM | POA: Insufficient documentation

## 2023-09-27 DIAGNOSIS — I252 Old myocardial infarction: Secondary | ICD-10-CM

## 2023-09-27 DIAGNOSIS — R0789 Other chest pain: Secondary | ICD-10-CM

## 2023-09-27 DIAGNOSIS — R9439 Abnormal result of other cardiovascular function study: Secondary | ICD-10-CM

## 2023-09-27 DIAGNOSIS — R079 Chest pain, unspecified: Secondary | ICD-10-CM | POA: Insufficient documentation

## 2023-09-27 HISTORY — PX: LEFT HEART CATH AND CORONARY ANGIOGRAPHY: CATH118249

## 2023-09-27 LAB — GLUCOSE, CAPILLARY
Glucose-Capillary: 111 mg/dL — ABNORMAL HIGH (ref 70–99)
Glucose-Capillary: 157 mg/dL — ABNORMAL HIGH (ref 70–99)

## 2023-09-27 SURGERY — LEFT HEART CATH AND CORONARY ANGIOGRAPHY
Anesthesia: Moderate Sedation | Laterality: Left

## 2023-09-27 MED ORDER — HYDRALAZINE HCL 20 MG/ML IJ SOLN: 10.0000 mg | INTRAMUSCULAR | Status: AC | PRN

## 2023-09-27 MED ORDER — CLOPIDOGREL BISULFATE 75 MG PO TABS
75.0000 mg | ORAL_TABLET | Freq: Every day | ORAL | Status: DC
  Administered 2023-09-28: 75 mg via ORAL
  Filled 2023-09-27: qty 1

## 2023-09-27 MED ORDER — LIDOCAINE HCL 1 % IJ SOLN
INTRAMUSCULAR | Status: AC
  Filled 2023-09-27: qty 20

## 2023-09-27 MED ORDER — FENTANYL CITRATE (PF) 100 MCG/2ML IJ SOLN
INTRAMUSCULAR | Status: DC | PRN
  Administered 2023-09-27: 50 ug via INTRAVENOUS

## 2023-09-27 MED ORDER — SODIUM CHLORIDE 0.9% FLUSH
3.0000 mL | Freq: Two times a day (BID) | INTRAVENOUS | Status: DC
  Administered 2023-09-28: 3 mL via INTRAVENOUS

## 2023-09-27 MED ORDER — INSULIN GLARGINE-YFGN 100 UNIT/ML ~~LOC~~ SOLN
10.0000 [IU] | Freq: Every day | SUBCUTANEOUS | Status: DC
  Administered 2023-09-27: 10 [IU] via SUBCUTANEOUS
  Filled 2023-09-27 (×2): qty 0.1

## 2023-09-27 MED ORDER — HEPARIN (PORCINE) IN NACL 1000-0.9 UT/500ML-% IV SOLN
INTRAVENOUS | Status: DC | PRN
  Administered 2023-09-27 (×2): 500 mL

## 2023-09-27 MED ORDER — ONDANSETRON HCL 4 MG PO TABS
4.0000 mg | ORAL_TABLET | Freq: Four times a day (QID) | ORAL | Status: DC | PRN
Start: 2023-09-27 — End: 2023-09-28

## 2023-09-27 MED ORDER — APIXABAN 5 MG PO TABS
5.0000 mg | ORAL_TABLET | Freq: Two times a day (BID) | ORAL | Status: DC
  Administered 2023-09-27 – 2023-09-28 (×2): 5 mg via ORAL
  Filled 2023-09-27 (×3): qty 1

## 2023-09-27 MED ORDER — SACUBITRIL-VALSARTAN 24-26 MG PO TABS
1.0000 | ORAL_TABLET | Freq: Two times a day (BID) | ORAL | Status: DC
  Administered 2023-09-27 – 2023-09-28 (×2): 1 via ORAL
  Filled 2023-09-27 (×2): qty 1

## 2023-09-27 MED ORDER — SODIUM CHLORIDE 0.9 % IV SOLN
INTRAVENOUS | Status: DC
Start: 2023-09-27 — End: 2023-09-27

## 2023-09-27 MED ORDER — ACETAMINOPHEN 325 MG PO TABS: 650.0000 mg | ORAL_TABLET | ORAL | Status: DC | PRN

## 2023-09-27 MED ORDER — AMIODARONE HCL 200 MG PO TABS
200.0000 mg | ORAL_TABLET | Freq: Two times a day (BID) | ORAL | Status: DC
  Administered 2023-09-27 – 2023-09-28 (×2): 200 mg via ORAL
  Filled 2023-09-27 (×2): qty 1

## 2023-09-27 MED ORDER — SODIUM CHLORIDE 0.9 % IV SOLN: 250.0000 mL | INTRAVENOUS | Status: DC | PRN

## 2023-09-27 MED ORDER — ACETAMINOPHEN 325 MG PO TABS: 650.0000 mg | ORAL_TABLET | Freq: Four times a day (QID) | ORAL | Status: DC | PRN

## 2023-09-27 MED ORDER — ACETAMINOPHEN 650 MG RE SUPP
650.0000 mg | Freq: Four times a day (QID) | RECTAL | Status: DC | PRN
  Filled 2023-09-27: qty 1

## 2023-09-27 MED ORDER — ISOSORBIDE MONONITRATE ER 30 MG PO TB24
30.0000 mg | ORAL_TABLET | Freq: Every day | ORAL | Status: DC
  Administered 2023-09-27 – 2023-09-28 (×2): 30 mg via ORAL
  Filled 2023-09-27 (×2): qty 1

## 2023-09-27 MED ORDER — DIVALPROEX SODIUM ER 250 MG PO TB24
250.0000 mg | ORAL_TABLET | Freq: Every day | ORAL | Status: DC
  Administered 2023-09-27: 250 mg via ORAL
  Filled 2023-09-27 (×2): qty 1

## 2023-09-27 MED ORDER — ATORVASTATIN CALCIUM 80 MG PO TABS
80.0000 mg | ORAL_TABLET | Freq: Every day | ORAL | Status: DC
  Administered 2023-09-27: 80 mg via ORAL
  Filled 2023-09-27: qty 1

## 2023-09-27 MED ORDER — SODIUM CHLORIDE 0.9 % WEIGHT BASED INFUSION
1.0000 mL/kg/h | INTRAVENOUS | Status: AC
  Administered 2023-09-27: 1 mL/kg/h via INTRAVENOUS

## 2023-09-27 MED ORDER — FENTANYL CITRATE (PF) 100 MCG/2ML IJ SOLN
INTRAMUSCULAR | Status: AC
  Filled 2023-09-27: qty 2

## 2023-09-27 MED ORDER — ONDANSETRON HCL 4 MG/2ML IJ SOLN: 4.0000 mg | Freq: Four times a day (QID) | INTRAMUSCULAR | Status: DC | PRN

## 2023-09-27 MED ORDER — IOHEXOL 300 MG/ML  SOLN
INTRAMUSCULAR | Status: DC | PRN
  Administered 2023-09-27: 132 mL

## 2023-09-27 MED ORDER — SODIUM CHLORIDE 0.9% FLUSH: 3.0000 mL | INTRAVENOUS | Status: DC | PRN

## 2023-09-27 MED ORDER — HEPARIN (PORCINE) IN NACL 1000-0.9 UT/500ML-% IV SOLN
INTRAVENOUS | Status: AC
  Filled 2023-09-27: qty 1000

## 2023-09-27 MED ORDER — POLYETHYLENE GLYCOL 3350 17 G PO PACK: 17.0000 g | PACK | Freq: Every day | ORAL | Status: DC | PRN

## 2023-09-27 MED ORDER — FLUTICASONE FUROATE-VILANTEROL 100-25 MCG/ACT IN AEPB
1.0000 | INHALATION_SPRAY | Freq: Every day | RESPIRATORY_TRACT | Status: DC
  Administered 2023-09-28: 1 via RESPIRATORY_TRACT
  Filled 2023-09-27: qty 28

## 2023-09-27 MED ORDER — ASPIRIN 81 MG PO CHEW: 81.0000 mg | CHEWABLE_TABLET | ORAL | Status: DC

## 2023-09-27 MED ORDER — EMPAGLIFLOZIN 25 MG PO TABS
25.0000 mg | ORAL_TABLET | Freq: Every day | ORAL | Status: DC
  Administered 2023-09-27 – 2023-09-28 (×2): 25 mg via ORAL
  Filled 2023-09-27 (×2): qty 1

## 2023-09-27 MED ORDER — FUROSEMIDE 20 MG PO TABS
20.0000 mg | ORAL_TABLET | Freq: Every day | ORAL | Status: DC
  Administered 2023-09-28: 20 mg via ORAL
  Filled 2023-09-27: qty 1

## 2023-09-27 MED ORDER — MIDAZOLAM HCL 2 MG/2ML IJ SOLN
INTRAMUSCULAR | Status: DC | PRN
  Administered 2023-09-27: 1 mg via INTRAVENOUS

## 2023-09-27 MED ORDER — LABETALOL HCL 5 MG/ML IV SOLN: 10.0000 mg | INTRAVENOUS | Status: AC | PRN

## 2023-09-27 MED ORDER — MIDAZOLAM HCL 2 MG/2ML IJ SOLN
INTRAMUSCULAR | Status: AC
  Filled 2023-09-27: qty 2

## 2023-09-27 MED ORDER — LIDOCAINE HCL (PF) 1 % IJ SOLN
INTRAMUSCULAR | Status: DC | PRN
  Administered 2023-09-27: 15 mL

## 2023-09-27 SURGICAL SUPPLY — 14 items
CATH ANGIO 5F JB2 100CM (CATHETERS) IMPLANT
CATH INFINITI 5 FR IM (CATHETERS) IMPLANT
CATH INFINITI 5FR JL4 (CATHETERS) IMPLANT
CATH INFINITI JR4 5F (CATHETERS) IMPLANT
DEVICE CLOSURE MYNXGRIP 5F (Vascular Products) IMPLANT
NDL PERC 18GX7CM (NEEDLE) IMPLANT
NEEDLE PERC 18GX7CM (NEEDLE) ×1 IMPLANT
PACK CARDIAC CATH (CUSTOM PROCEDURE TRAY) ×2 IMPLANT
SET ATX-X65L (MISCELLANEOUS) IMPLANT
SHEATH AVANTI 5FR X 11CM (SHEATH) IMPLANT
STATION PROTECTION PRESSURIZED (MISCELLANEOUS) IMPLANT
WIRE EMERALD 3MM-J .035X150CM (WIRE) IMPLANT
WIRE EMERALD 3MM-J .035X260CM (WIRE) IMPLANT
WIRE GUIDERIGHT .035X150 (WIRE) IMPLANT

## 2023-09-27 NOTE — Consult Note (Addendum)
 Thomas Moore is a 68 y.o. male  [id number]  Primary Cardiologist: Debborah Fairly  Reason for Consultation: Unstable angina with chest pain.  HPI: This is a 68 year old white male with a past medical history of congestive heart failure status post CABG in 1997 three-vessel with history of TAVR and atrial fibrillation presented to the hospital with chest pain in March and was found to be in non-STEMI.  Patient had been with the emergency room last week with chest pain but this time ruled out for myocardial infarction.  Patient was scheduled for cardiac catheterization which was performed without complication today.   Review of Systems: No orthopnea PND or leg swelling   Past Medical History:  Diagnosis Date   CAD (coronary artery disease) of artery bypass graft    s/p CABG x 4 in 1997   CHF (congestive heart failure) (HCC)    COPD (chronic obstructive pulmonary disease) (HCC)    Coronary artery disease    CVA (cerebral vascular accident) (HCC)    Diabetes mellitus without complication (HCC)    Dysrhythmia    GERD (gastroesophageal reflux disease)    HLD (hyperlipidemia)    Hypertension    S/P TAVR (transcatheter aortic valve replacement) 07/05/2021   with Edwards 26mm S3UR via TF approach with Dr. Arlester Ladd and Dr. Sherene Dilling   Seizures [facility])     Medications Prior to Admission  Medication Sig Dispense Refill   amiodarone  (PACERONE ) 200 MG tablet Take 1 tablet (200 mg total) by mouth 2 (two) times daily. 30 tablet 3   atorvastatin  (LIPITOR ) 80 MG tablet Take 1 tablet (80 mg total) by mouth at bedtime. 30 tablet 0   clopidogrel  (PLAVIX ) 75 MG tablet Take 1 tablet (75 mg total) by mouth daily. 30 tablet 11   divalproex  (DEPAKOTE  ER) 250 MG 24 hr tablet Take 1 tablet (250 mg total) by mouth at bedtime. 30 tablet 0   furosemide  (LASIX ) 20 MG tablet Take 1 tablet (20 mg total) by mouth daily. 30 tablet 1   insulin  degludec (TRESIBA) 100 UNIT/ML FlexTouch Pen Inject 10 Units into the skin  daily.     isosorbide  mononitrate (IMDUR ) 30 MG 24 hr tablet Take 1/2 tab dialy 30 tablet 1   JARDIANCE  25 MG TABS tablet Take 25 mg by mouth daily.     Multiple Vitamin (MULTIVITAMIN) capsule Take 1 capsule by mouth daily.     nitroGLYCERIN  (NITROSTAT ) 0.4 MG SL tablet Place 1 tablet (0.4 mg total) under the tongue every 5 (five) minutes as needed for chest pain. 30 tablet 2   sacubitril -valsartan  (ENTRESTO ) 24-26 MG Take 1 tablet by mouth 2 (two) times daily. 60 tablet 2   acetaminophen  (TYLENOL ) 650 MG CR tablet Take 1,300 mg by mouth every 8 (eight) hours as needed for pain.     apixaban  (ELIQUIS ) 5 MG TABS tablet Take 1 tablet (5 mg total) by mouth 2 (two) times daily. 180 tablet 1   benzonatate  (TESSALON  PERLES) 100 MG capsule Take 1 capsule (100 mg total) by mouth 3 (three) times daily as needed for cough. 20 capsule 0   Calcium  Citrate-Vitamin D  (CALCIUM  + D PO) Take 1 tablet by mouth 2 (two) times daily.     Dulaglutide  4.5 MG/0.5ML SOAJ Inject 4.5 mg into the skin once a week.     metoprolol  succinate (TOPROL -XL) 25 MG 24 hr tablet Take 0.5 tablets (12.5 mg total) by mouth daily. 30 tablet 0      [START ON 09/28/2023] aspirin   81 mg Oral Pre-Cath    Infusions:  sodium chloride      sodium chloride       No Known Allergies    Family History  Problem Relation Age of Onset   Seizures Brother     PHYSICAL EXAM: Vitals:   09/27/23 1142 09/27/23 1200  BP: (!) 145/82 129/67  Pulse: 94 93  Resp: 16 15  Temp:    SpO2: 95% 95%    No intake or output data in the 24 hours ending 09/27/23 1211  General:  Well appearing. No respiratory difficulty HEENT: normal Neck: supple. no JVD. Carotids 2+ bilat; no bruits. No lymphadenopathy or thryomegaly appreciated. Cor: PMI nondisplaced. Regular rate & rhythm. No rubs, gallops or murmurs. Lungs: clear Abdomen: soft, nontender, nondistended. No hepatosplenomegaly. No bruits or masses. Good bowel sounds. Extremities: no cyanosis,  clubbing, rash, edema Neuro: alert & oriented x 3, cranial nerves grossly intact. moves all 4 extremities w/o difficulty. Affect pleasant.  ECG: Atrial fibrillation 93 bpm with LVH and old anteroseptal wall MI and nonspecific ST-T changes.  Results for orders placed or performed during the hospital encounter of 09/27/23 (from the past 24 hours)  Glucose, capillary     Status: Abnormal   Collection Time: 09/27/23 10:28 AM  Result Value Ref Range   Glucose-Capillary 111 (H) 70 - 99 mg/dL   CARDIAC CATHETERIZATION Result Date: 09/27/2023   Ost RCA to Prox RCA lesion is 100% stenosed.   Prox Cx lesion is 90% stenosed.   1st Diag lesion is 80% stenosed.   Mid LAD lesion is 80% stenosed.   1st Mrg lesion is 80% stenosed.   Origin lesion is 100% stenosed.   Recommend dual antiplatelet therapy. LIMA to the LAD was patent, sequential SVG graft to left circumflex branches OM1 and OM 2 are patent.  SVG to PDA is occluded.  Native RCA is completely occluded with bridging collaterals.  Diagonal to the LAD has 80% lesion but is tortuous and not amenable to PCI.  LV gram was deferred due to patient having prior aortic valve replacement.  Patient will be treated medically as diagonal is unprotected but has complex lesion not amenable to PCI.      ASSESSMENT AND PLAN: Unstable angina status post cardiac catheterization which revealed LIMA to the LAD was patent, SVG to OM1 and OM2 was patent.  SVG to PDA was occluded.  High-grade lesion in diagonal which is not amenable to PCI.  Probably diagonal is the cause for chest pain.  RCA proximally was also occluded.  Patient will be treated medically and will have to be observed overnight.  Patient had nobody to take care overnight and will need to be observed and can be discharged in the morning.  [name]

## 2023-09-27 NOTE — Assessment & Plan Note (Signed)
-   Will need to discuss if patient will continue Eliquis  while on DAPT - Otherwise, continue home regimen
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
## 2023-09-27 NOTE — H&P (Addendum)
 History and Physical    Patient: Thomas Moore:[id number] DOB: 1955/07/11 DOA: 09/27/2023 DOS: the patient was seen and examined on 09/27/2023 PCP: [name], MD  Patient coming from: Home  Chief Complaint: No chief complaint on file.  HPI: Thomas Moore is a 68 y.o. male with medical history significant of CAD s/p CABG (1997), chronic diastolic CHF, COPD, CVA, DM, HTN, HLD, s/p TAVR, and seizure who presented to the hospital due to scheduled left heart cath.  TRH consulted for observation due to unsafe discharge home.  Mr. Gillian states that he feels well at this time and has not been experiencing any chest pain over the past couple days.  He denies any nausea, vomiting, abdominal pain, shortness of breath, lower extremity swelling.  Hospital course: On arrival to the hospital, patient was hypertensive at 143/73 with heart rate of 84.  He was saturating at 97% on room air.  He was afebrile at 97.6.Patient underwent left heart cath with adults demonstrating SVG to PDA occlusion and diagonal to LAD 80% lesion that is not amenable to PCI.  Patient was started on DAPT.  Review of Systems: As mentioned in the history of present illness. All other systems reviewed and are negative.  Past Medical History:  Diagnosis Date   CAD (coronary artery disease) of artery bypass graft    s/p CABG x 4 in 1997   CHF (congestive heart failure) (HCC)    COPD (chronic obstructive pulmonary disease) (HCC)    Coronary artery disease    CVA (cerebral vascular accident) (HCC)    Diabetes mellitus without complication (HCC)    Dysrhythmia    GERD (gastroesophageal reflux disease)    HLD (hyperlipidemia)    Hypertension    S/P TAVR (transcatheter aortic valve replacement) 07/05/2021   with Edwards 26mm S3UR via TF approach with Dr. Arlester Ladd and Dr. Sherene Dilling   Seizures [facility])    Past Surgical History:  Procedure Laterality Date   BYPASS GRAFT ANGIOGRAPHY N/A 04/25/2021   Procedure: BYPASS GRAFT  ANGIOGRAPHY;  Surgeon: Link Rice, MD;  Location: [facility] INVASIVE CV LAB;  Service: Cardiovascular;  Laterality: N/A;   CARDIAC SURGERY     CORNEAL TRANSPLANT Right    CORONARY ARTERY BYPASS GRAFT  1997   ELECTROPHYSIOLOGY STUDY N/A 09/08/2021   Procedure: ELECTROPHYSIOLOGY STUDY;  Surgeon: Boyce Byes, MD;  Location: [facility] INVASIVE CV LAB;  Service: Cardiovascular;  Laterality: N/A;   EYE SURGERY     FLEXOR TENDON REPAIR Left 07/11/2019   Procedure: FLEXOR tenolysis  REPAIR LEFT RING FINGER with tednon repair;  Surgeon: Molli Angelucci, MD;  Location: [facility] ORS;  Service: Orthopedics;  Laterality: Left;   INCISION AND DRAINAGE ABSCESS Left 05/08/2019   Procedure: INCISION AND DRAINAGE ABSCESS;  Surgeon: Arlyne Lame, MD;  Location: [facility] ORS;  Service: Orthopedics;  Laterality: Left;   INTRAOPERATIVE TRANSTHORACIC ECHOCARDIOGRAM N/A 07/05/2021   Procedure: INTRAOPERATIVE TRANSTHORACIC ECHOCARDIOGRAM;  Surgeon: Arnoldo Lapping, MD;  Location: [facility] OR;  Service: Open Heart Surgery;  Laterality: N/A;   LEFT HEART CATH AND CORONARY ANGIOGRAPHY Left 06/09/2022   Procedure: LEFT HEART CATH AND CORONARY ANGIOGRAPHY;  Surgeon: Cherrie Cornwall, MD;  Location: [facility] INVASIVE CV LAB;  Service: Cardiovascular;  Laterality: Left;   LEFT HEART CATH AND CORS/GRAFTS ANGIOGRAPHY N/A 10/02/2019   Procedure: LEFT HEART CATH AND CORS/GRAFTS ANGIOGRAPHY;  Surgeon: Arleen Lacer, MD;  Location: [facility] INVASIVE CV LAB;  Service: Cardiovascular;  Laterality: N/A;   LEFT HEART CATH AND CORS/GRAFTS  ANGIOGRAPHY N/A 07/17/2022   Procedure: LEFT HEART CATH AND CORS/GRAFTS ANGIOGRAPHY and possible PCI and stent;  Surgeon: Cherrie Cornwall, MD;  Location: [facility] INVASIVE CV LAB;  Service: Cardiovascular;  Laterality: N/A;   LOOP RECORDER INSERTION N/A 10/06/2019   Procedure: LOOP RECORDER INSERTION;  Surgeon: Tammie Fall, MD;  Location: [facility] INVASIVE CV LAB;  Service: Cardiovascular;  Laterality: N/A;   TRANSCATHETER AORTIC  VALVE REPLACEMENT, TRANSFEMORAL N/A 07/05/2021   Procedure: TRANSCATHETER AORTIC VALVE REPLACEMENT, TRANSFEMORAL;  Surgeon: Arnoldo Lapping, MD;  Location: [facility] OR;  Service: Open Heart Surgery;  Laterality: N/A;   TRIGGER FINGER RELEASE Left    Social History:  reports that he quit smoking about 41 years ago. His smoking use included cigarettes. He has never used smokeless tobacco. He reports that he does not currently use alcohol. He reports that he does not use drugs.  No Known Allergies  Family History  Problem Relation Age of Onset   Seizures Brother     Prior to Admission medications   Medication Sig Start Date End Date Taking? Authorizing Provider  amiodarone  (PACERONE ) 200 MG tablet Take 1 tablet (200 mg total) by mouth 2 (two) times daily. 09/13/23  Yes Cherrie Cornwall, MD  atorvastatin  (LIPITOR ) 80 MG tablet Take 1 tablet (80 mg total) by mouth at bedtime. 08/06/23  Yes Lorita Rosa, MD  clopidogrel  (PLAVIX ) 75 MG tablet Take 1 tablet (75 mg total) by mouth daily. 10/27/22 10/27/23 Yes Cherrie Cornwall, MD  divalproex  (DEPAKOTE  ER) 250 MG 24 hr tablet Take 1 tablet (250 mg total) by mouth at bedtime. 08/06/23  Yes Lorita Rosa, MD  furosemide  (LASIX ) 20 MG tablet Take 1 tablet (20 mg total) by mouth daily. 05/23/23  Yes Cherrie Cornwall, MD  insulin  degludec (TRESIBA) 100 UNIT/ML FlexTouch Pen Inject 10 Units into the skin daily. 07/11/23  Yes [provider]  isosorbide  mononitrate (IMDUR ) 30 MG 24 hr tablet Take 1/2 tab dialy 01/25/23  Yes Debborah Fairly A, MD  JARDIANCE  25 MG TABS tablet Take 25 mg by mouth daily.   Yes [provider]  Multiple Vitamin (MULTIVITAMIN) capsule Take 1 capsule by mouth daily.   Yes [provider]  nitroGLYCERIN  (NITROSTAT ) 0.4 MG SL tablet Place 1 tablet (0.4 mg total) under the tongue every 5 (five) minutes as needed for chest pain. 08/03/23  Yes Scoggins, Amber, NP  sacubitril -valsartan  (ENTRESTO ) 24-26 MG Take 1 tablet by mouth  2 (two) times daily. 09/13/23  Yes Cherrie Cornwall, MD  acetaminophen  (TYLENOL ) 650 MG CR tablet Take 1,300 mg by mouth every 8 (eight) hours as needed for pain.    [provider]  apixaban  (ELIQUIS ) 5 MG TABS tablet Take 1 tablet (5 mg total) by mouth 2 (two) times daily. 11/14/22   Riddle, Suzann, NP  benzonatate  (TESSALON  PERLES) 100 MG capsule Take 1 capsule (100 mg total) by mouth 3 (three) times daily as needed for cough. 06/23/23   Iver Marker, MD  Calcium  Citrate-Vitamin D  (CALCIUM  + D PO) Take 1 tablet by mouth 2 (two) times daily.    [provider]  Dulaglutide  4.5 MG/0.5ML SOAJ Inject 4.5 mg into the skin once a week. 05/10/23   [provider]  metoprolol  succinate (TOPROL -XL) 25 MG 24 hr tablet Take 0.5 tablets (12.5 mg total) by mouth daily. 08/07/23   Lorita Rosa, MD    Physical Exam: Vitals:   09/27/23 1132 09/27/23 1137 09/27/23 1142 09/27/23 1200  BP: 136/76 134/71 (!) 145/82  129/67  Pulse: 100 94 94 93  Resp: 16 16 16 15   Temp:      TempSrc:      SpO2: 94% 95% 95% 95%  Weight:      Height:       Physical Exam Vitals and nursing note reviewed.  Constitutional:      General: He is not in acute distress.    Appearance: He is normal weight. He is not toxic-appearing.  HENT:     Mouth/Throat:     Mouth: Mucous membranes are moist.     Pharynx: Oropharynx is clear.  Eyes:     Conjunctiva/sclera: Conjunctivae normal.     Pupils: Pupils are equal, round, and reactive to light.  Cardiovascular:     Rate and Rhythm: Normal rate and regular rhythm.     Heart sounds: No murmur heard.    No gallop.  Pulmonary:     Effort: Pulmonary effort is normal. No respiratory distress.     Breath sounds: Normal breath sounds. No wheezing or rales.  Abdominal:     General: Bowel sounds are normal. There is no distension.     Palpations: Abdomen is soft.     Tenderness: There is no abdominal tenderness. There is no guarding.  Musculoskeletal:     Right  lower leg: No edema.     Left lower leg: No edema.  Skin:    General: Skin is warm and dry.  Neurological:     Mental Status: He is alert and oriented to person, place, and time. Mental status is at baseline.  Psychiatric:        Mood and Affect: Mood normal.        Behavior: Behavior normal.    Data Reviewed:  CARDIAC CATHETERIZATION Result Date: 09/27/2023   Ost RCA to Prox RCA lesion is 100% stenosed.   Prox Cx lesion is 90% stenosed.   1st Diag lesion is 80% stenosed.   Mid LAD lesion is 80% stenosed.   1st Mrg lesion is 80% stenosed.   Origin lesion is 100% stenosed.   Recommend dual antiplatelet therapy. LIMA to the LAD was patent, sequential SVG graft to left circumflex branches OM1 and OM 2 are patent.  SVG to PDA is occluded.  Native RCA is completely occluded with bridging collaterals.  Diagonal to the LAD has 80% lesion but is tortuous and not amenable to PCI.  LV gram was deferred due to patient having prior aortic valve replacement.  Patient will be treated medically as diagonal is unprotected but has complex lesion not amenable to PCI.   There are no new results to review at this time.  Assessment and Plan:  * Unstable angina (HCC) Patient has a history of recurrent unstable angina and underwent left heart cath earlier today demonstrating 80% occlusion of diagonal, suspected to be causing patient's symptoms, however not amenable to PCI.  Patient will need monitoring/supervision post cath, however no family members are available at home and patient lives alone.  Due to this, we will keep overnight for observation.  - Continue DAPT per cardiology - Continue home regimen  Chronic HFrEF (heart failure with reduced ejection fraction) (HCC) Per chart review, patient has a history of HFrEF with last EF of 30-35% when checked in March 2025.  He appears euvolemic at this time.  - Continue home regimen  Paroxysmal atrial fibrillation (HCC) - Will need to discuss if patient will  continue Eliquis  while on DAPT - Otherwise, continue home regimen  Essential hypertension - Continue home regimen  Controlled type 2 diabetes mellitus with stage 3 chronic kidney disease, without long-term current use of insulin  (HCC) History of type 2 diabetes with last A1c of 7%.  - Resume home regimen  Advance Care Planning:   Code Status: Full Code   Consults: Cardiology  Family Communication: No family at bedside  Severity of Illness: The appropriate patient status for this patient is OBSERVATION. Observation status is judged to be reasonable and necessary in order to provide the required intensity of service to ensure the patient's safety. The patient's presenting symptoms, physical exam findings, and initial radiographic and laboratory data in the context of their medical condition is felt to place them at decreased risk for further clinical deterioration. Furthermore, it is anticipated that the patient will be medically stable for discharge from the hospital within 2 midnights of admission.   Author: Avi Body, MD 09/27/2023 1:51 PM  For on call review [email or web address].

## 2023-09-27 NOTE — Assessment & Plan Note (Signed)
 Patient has a history of recurrent unstable angina and underwent left heart cath earlier today demonstrating 80% occlusion of diagonal, suspected to be causing patient's symptoms, however not amenable to PCI.  Patient will need monitoring/supervision post cath, however no family members are available at home and patient lives alone.  Due to this, we will keep overnight for observation.  - Continue DAPT per cardiology - Continue home regimen

## 2023-09-27 NOTE — Plan of Care (Signed)

## 2023-09-27 NOTE — Assessment & Plan Note (Signed)
 -  Continue home regimen

## 2023-09-27 NOTE — Assessment & Plan Note (Signed)
 Per chart review, patient has a history of HFrEF with last EF of 30-35% when checked in March 2025.  He appears euvolemic at this time.  - Continue home regimen

## 2023-09-27 NOTE — Assessment & Plan Note (Signed)
 History of type 2 diabetes with last A1c of 7%.  - Resume home regimen

## 2023-09-28 ENCOUNTER — Encounter: Payer: Self-pay | Admitting: Cardiovascular Disease

## 2023-09-28 DIAGNOSIS — I200 Unstable angina: Secondary | ICD-10-CM | POA: Diagnosis not present

## 2023-09-28 DIAGNOSIS — I25110 Atherosclerotic heart disease of native coronary artery with unstable angina pectoris: Secondary | ICD-10-CM | POA: Diagnosis not present

## 2023-09-28 NOTE — Progress Notes (Signed)
 Transition of Care [facility]) - Inpatient Brief Assessment   Patient Details  Name: Thomas Moore MRN: [id number] Date of Birth: Apr 29, 1956  Transition of Care [facility]) CM/SW Contact:    Nohemi Nicklaus C Cirilo Canner, RN Phone Number: 09/28/2023, 11:47 AM   Clinical Narrative: TOC continuing to follow patient's progress throughout discharge planning.   Transition of Care Asessment: Insurance and Status: Insurance coverage has been reviewed Patient has primary care physician: Yes   Prior level of function:: independent Prior/Current Home Services: No current home services Social Drivers of Health Review: SDOH reviewed no interventions necessary Readmission risk has been reviewed: Yes Transition of care needs: no transition of care needs at this time

## 2023-09-28 NOTE — Progress Notes (Signed)
 SUBJECTIVE: Patient denies any chest pain and shortness of breath   Vitals:   09/27/23 2024 09/27/23 2344 09/28/23 0359 09/28/23 0738  BP: 127/71 (!) 114/58 127/68 127/66  Pulse: 94 72 75 73  Resp: 18 16 17 15   Temp: (!) 97.5 F (36.4 C) 97.8 F (36.6 C) (!) 97.5 F (36.4 C) 97.6 F (36.4 C)  TempSrc: Oral Oral Oral   SpO2: 95% 95% 100% 100%  Weight:      Height:        Intake/Output Summary (Last 24 hours) at 09/28/2023 0905 Last data filed at 09/28/2023 0347 Gross per 24 hour  Intake 243 ml  Output --  Net 243 ml    LABS: Basic Metabolic Panel: No results for input(s): "NA", "K", "CL", "CO2", "GLUCOSE", "BUN", "CREATININE", "CALCIUM ", "MG", "PHOS" in the last 72 hours. Liver Function Tests: No results for input(s): "AST", "ALT", "ALKPHOS", "BILITOT", "PROT", "ALBUMIN" in the last 72 hours. No results for input(s): "LIPASE", "AMYLASE" in the last 72 hours. CBC: No results for input(s): "WBC", "NEUTROABS", "HGB", "HCT", "MCV", "PLT" in the last 72 hours. Cardiac Enzymes: No results for input(s): "CKTOTAL", "CKMB", "CKMBINDEX", "TROPONINI" in the last 72 hours. BNP: Invalid input(s): "POCBNP" D-Dimer: No results for input(s): "DDIMER" in the last 72 hours. Hemoglobin A1C: No results for input(s): "HGBA1C" in the last 72 hours. Fasting Lipid Panel: No results for input(s): "CHOL", "HDL", "LDLCALC", "TRIG", "CHOLHDL", "LDLDIRECT" in the last 72 hours. Thyroid  Function Tests: No results for input(s): "TSH", "T4TOTAL", "T3FREE", "THYROIDAB" in the last 72 hours.  Invalid input(s): "FREET3" Anemia Panel: No results for input(s): "VITAMINB12", "FOLATE", "FERRITIN", "TIBC", "IRON", "RETICCTPCT" in the last 72 hours.   PHYSICAL EXAM General: Well developed, well nourished, in no acute distress HEENT:  Normocephalic and atramatic Neck:  No JVD.  Lungs: Clear bilaterally to auscultation and percussion. Heart: HRRR . Normal S1 and S2 without gallops or murmurs.  Abdomen:  Bowel sounds are positive, abdomen soft and non-tender  Msk:  Back normal, normal gait. Normal strength and tone for age. Extremities: No clubbing, cyanosis or edema.   Neuro: Alert and oriented X 3. Psych:  Good affect, responds appropriately  TELEMETRY: Normal sinus rhythm  ASSESSMENT AND PLAN: Status post cardiac catheterization with a high-grade lesion in native diagonal which is tortuous and not amenable to PCI.  LIMA to the LAD was patent.  SVG to OM1 and OM 2 were patent.  Native RCA was occluded and SVG to RCA was also occluded.  Patient will be treated medically on Eliquis  and Plavix .  Patient can be discharged with follow-up in the office.  He already has an appointment in the office next week.   ICD-10-CM   1. Other chest pain  R07.89 CARDIAC CATHETERIZATION    CARDIAC CATHETERIZATION    DISCONTINUED: sodium chloride  flush (NS) 0.9 % injection 3 mL    DISCONTINUED: 0.9 %  sodium chloride  infusion    DISCONTINUED: aspirin  chewable tablet 81 mg    DISCONTINUED: 0.9 %  sodium chloride  infusion    2. Dizziness  R42 CARDIAC CATHETERIZATION    CARDIAC CATHETERIZATION    3. Chronic combined systolic and diastolic congestive heart failure (HCC)  I50.42 CARDIAC CATHETERIZATION    CARDIAC CATHETERIZATION    4. SOB (shortness of breath)  R06.02 CARDIAC CATHETERIZATION    CARDIAC CATHETERIZATION    5. Coronary artery disease with hx of myocardial infarct w/o hx of CABG  I25.10 CARDIAC CATHETERIZATION   I25.2 CARDIAC CATHETERIZATION    6.  Unstable angina due to arteriosclerosis of coronary artery bypass graft (HCC)  I25.700 CARDIAC CATHETERIZATION    CARDIAC CATHETERIZATION    7. Abnormal nuclear stress test  R94.39 DISCONTINUED: sodium chloride  flush (NS) 0.9 % injection 3 mL    DISCONTINUED: 0.9 %  sodium chloride  infusion    DISCONTINUED: aspirin  chewable tablet 81 mg    DISCONTINUED: 0.9 %  sodium chloride  infusion      Principal Problem:   Unstable angina (HCC) Active  Problems:   Controlled type 2 diabetes mellitus with stage 3 chronic kidney disease, without long-term current use of insulin  (HCC)   Essential hypertension   Coronary artery disease with hx of myocardial infarct w/o hx of CABG   Chest pain   Paroxysmal atrial fibrillation (HCC)   Chronic HFrEF (heart failure with reduced ejection fraction) (HCC)   Dizziness   SOB (shortness of breath)   Unstable angina due to arteriosclerosis of coronary artery bypass graft [facility])    Debborah Fairly, MD, [facility] 09/28/2023 9:05 AM

## 2023-09-28 NOTE — Care Management Obs Status (Deleted)
 MEDICARE OBSERVATION STATUS NOTIFICATION   Patient Details  Name: Thomas Moore MRN: [id number] Date of Birth: Nov 02, 1955   Medicare Observation Status Notification Given:       Anise Kerns 09/28/2023, 12:37 PM

## 2023-09-28 NOTE — Discharge Summary (Signed)
 Physician Discharge Summary   Patient: Thomas Moore MRN: [id number] DOB: Jul 28, 1955  Admit date:     09/27/2023  Discharge date: 09/28/23  Discharge Physician: Ezzard Holms   PCP: [name], MD   Recommendations at discharge:  Follow-up with cardiology  Discharge Diagnoses: Unstable angina (HCC) Chronic HFrEF (heart failure with reduced ejection fraction) (HCC) Paroxysmal atrial fibrillation (HCC) Essential hypertension Controlled type 2 diabetes mellitus with stage 3 chronic kidney disease, without long-term current use of insulin   Hospital Course: Thomas Moore is a 68 y.o. male with medical history significant of CAD s/p CABG (1997), chronic diastolic CHF, COPD, CVA, DM, HTN, HLD, s/p TAVR, and seizure who presented to the hospital due to scheduled left heart cath.  TRH consulted for observation due to unsafe discharge home.   Thomas Moore states that he feels well at this time and has not been experiencing any chest pain over the past couple days.  He denies any nausea, vomiting, abdominal pain, shortness of breath, lower extremity swelling.   Hospital course: On arrival to the hospital, patient was hypertensive at 143/73 with heart rate of 84.  He was saturating at 97% on room air.  He was afebrile at 97.6.Patient underwent left heart cath with adults demonstrating SVG to PDA occlusion and diagonal to LAD 80% lesion that is not amenable to PCI.  Patient was started on DAPT. Patient has been cleared by general surgery for discharge today and will follow-up as an outpatient.  Consultants: Neurology Procedures performed: As mentioned above Disposition: Home Diet recommendation:  Cardiac diet DISCHARGE MEDICATION: Allergies as of 09/28/2023   No Known Allergies      Medication List     TAKE these medications    acetaminophen  650 MG CR tablet Commonly known as: TYLENOL  Take 1,300 mg by mouth every 8 (eight) hours as needed for pain.   amiodarone  200 MG  tablet Commonly known as: Pacerone  Take 1 tablet (200 mg total) by mouth 2 (two) times daily.   apixaban  5 MG Tabs tablet Commonly known as: ELIQUIS  Take 1 tablet (5 mg total) by mouth 2 (two) times daily.   atorvastatin  80 MG tablet Commonly known as: LIPITOR  Take 1 tablet (80 mg total) by mouth at bedtime.   benzonatate  100 MG capsule Commonly known as: Tessalon  Perles Take 1 capsule (100 mg total) by mouth 3 (three) times daily as needed for cough.   CALCIUM  + D PO Take 1 tablet by mouth 2 (two) times daily.   clopidogrel  75 MG tablet Commonly known as: Plavix  Take 1 tablet (75 mg total) by mouth daily.   divalproex  250 MG 24 hr tablet Commonly known as: DEPAKOTE  ER Take 1 tablet (250 mg total) by mouth at bedtime.   Dulaglutide  4.5 MG/0.5ML Soaj Inject 4.5 mg into the skin once a week.   Entresto  24-26 MG Generic drug: sacubitril -valsartan  Take 1 tablet by mouth 2 (two) times daily.   fluticasone -salmeterol 100-50 MCG/ACT Aepb Commonly known as: ADVAIR Inhale into the lungs.   furosemide  20 MG tablet Commonly known as: Lasix  Take 1 tablet (20 mg total) by mouth daily.   insulin  degludec 100 UNIT/ML FlexTouch Pen Commonly known as: TRESIBA Inject 10 Units into the skin daily.   isosorbide  mononitrate 30 MG 24 hr tablet Commonly known as: IMDUR  Take 1/2 tab dialy   Jardiance  25 MG Tabs tablet Generic drug: empagliflozin  Take 25 mg by mouth daily.   metoprolol  succinate 25 MG 24 hr tablet Commonly known as:  TOPROL -XL Take 0.5 tablets (12.5 mg total) by mouth daily.   moxifloxacin 0.5 % ophthalmic solution Commonly known as: VIGAMOX Place 1 drop into the right eye every 3 (three) hours.   multivitamin capsule Take 1 capsule by mouth daily.   nitroGLYCERIN  0.4 MG SL tablet Commonly known as: NITROSTAT  Place 1 tablet (0.4 mg total) under the tongue every 5 (five) minutes as needed for chest pain.   prednisoLONE acetate 1 % ophthalmic  suspension Commonly known as: PRED FORTE INSTILL 1 DROP INTO RIGHT EYE THREE TIMES DAILY FOR 10 DAYS   trimethoprim-polymyxin b  ophthalmic solution Commonly known as: POLYTRIM Place 1 drop into the right eye every 4 (four) hours.        Follow-up Information     Thomas Baumgarten, MD .   Specialty: Internal Medicine Contact information: [address] [place] [address] [phone]                Discharge Exam: Thomas Moore Weights   09/27/23 1021  Weight: 59.9 kg   Constitutional:      General: He is not in acute distress.    Appearance: He is normal weight. He is not toxic-appearing.  HENT:     Mouth/Throat:     Mouth: Mucous membranes are moist.     Pharynx: Oropharynx is clear.  Eyes:     Conjunctiva/sclera: Conjunctivae normal.     Pupils: Pupils are equal, round, and reactive to light.  Cardiovascular:     Rate and Rhythm: Normal rate and regular rhythm.     Heart sounds: No murmur heard.    No gallop.  Pulmonary:     Effort: Pulmonary effort is normal. No respiratory distress.     Breath sounds: Normal breath sounds. No wheezing or rales.  Abdominal:     General: Bowel sounds are normal. There is no distension.     Palpations: Abdomen is soft.     Tenderness: There is no abdominal tenderness. There is no guarding.  Musculoskeletal:     Right lower leg: No edema.  Condition at discharge: good  The results of significant diagnostics from this hospitalization (including imaging, microbiology, ancillary and laboratory) are listed below for reference.   Imaging Studies: CARDIAC CATHETERIZATION Result Date: 09/27/2023   Ost RCA to Prox RCA lesion is 100% stenosed.   Prox Cx lesion is 90% stenosed.   1st Diag lesion is 80% stenosed.   Mid LAD lesion is 80% stenosed.   1st Mrg lesion is 80% stenosed.   Origin lesion is 100% stenosed.   Recommend dual antiplatelet therapy. LIMA to the LAD was patent, sequential SVG graft to left  circumflex branches OM1 and OM 2 are patent.  SVG to PDA is occluded.  Native RCA is completely occluded with bridging collaterals.  Diagonal to the LAD has 80% lesion but is tortuous and not amenable to PCI.  LV gram was deferred due to patient having prior aortic valve replacement.  Patient will be treated medically as diagonal is unprotected but has complex lesion not amenable to PCI.   DG Humerus Left Result Date: 09/17/2023 CLINICAL DATA:  Ecchymosis.  Left arm pain EXAM: LEFT HUMERUS - 2 VIEW COMPARISON:  None Available. FINDINGS: There is no evidence of fracture or other focal bone lesions. Soft tissues are unremarkable. IMPRESSION: No acute osseous abnormality. Electronically Signed   By: Adrianna Horde M.D.   On: 09/17/2023 17:13   DG Chest 2 View Result Date: 09/17/2023 CLINICAL DATA:  Left arm pain.  Prior MI. EXAM: CHEST - 2 VIEW COMPARISON:  Chest x-ray 08/04/2023 and older FINDINGS: Status post median sternotomy. Status post TAVR. Loop recorder. Hyperinflation. Blunting of the costophrenic angles. Tiny effusion versus pleural thickening. No pneumothorax or edema. No consolidation. Degenerative changes along the spine. Osteopenia degenerative changes. IMPRESSION: Postop chest with hyperinflation.  Status post TAVR.  Loop recorder. Tiny pleural effusions versus pleural thickening. Electronically Signed   By: Adrianna Horde M.D.   On: 09/17/2023 17:13   CUP PACEART REMOTE DEVICE CHECK Result Date: 09/10/2023 ILR summary report received. Battery status OK. Normal device function. No new symptom, tachy, brady, or pause episodes. No new AF episodes. Monthly summary reports and ROV/PRN - CS, CVRS   Microbiology: Results for orders placed or performed during the hospital encounter of 08/04/23  MRSA Next Gen by PCR, Nasal     Status: Abnormal   Collection Time: 08/06/23  6:35 AM   Specimen: Nasal Mucosa; Nasal Swab  Result Value Ref Range Status   MRSA by PCR Next Gen DETECTED (A) NOT DETECTED  Final    Comment: RESULT CALLED TO, READ BACK BY AND VERIFIED WITH: Conway Dennis RN [phone] 08/06/23 HNM (NOTE) The GeneXpert MRSA Assay (FDA approved for NASAL specimens only), is one component of a comprehensive MRSA colonization surveillance program. It is not intended to diagnose MRSA infection nor to guide or monitor treatment for MRSA infections. Test performance is not FDA approved in patients less than 15 years old. Performed at [facility], [address] [place], [place] [address]     Labs: CBC: No results for input(s): "WBC", "NEUTROABS", "HGB", "HCT", "MCV", "PLT" in the last 168 hours. Basic Metabolic Panel: No results for input(s): "NA", "K", "CL", "CO2", "GLUCOSE", "BUN", "CREATININE", "CALCIUM ", "MG", "PHOS" in the last 168 hours. Liver Function Tests: No results for input(s): "AST", "ALT", "ALKPHOS", "BILITOT", "PROT", "ALBUMIN" in the last 168 hours. CBG: Recent Labs  Lab 09/27/23 1028 09/27/23 2106  GLUCAP 111* 157*    Discharge time spent:  35 minutes.  Signed: Ezzard Holms, MD [facility] 09/28/2023

## 2023-09-28 NOTE — Care Management Obs Status (Signed)
 MEDICARE OBSERVATION STATUS NOTIFICATION   Patient Details  Name: FAIZ WEBER MRN: [id number] Date of Birth: 1956/01/27   Medicare Observation Status Notification Given:  Yes    Anise Kerns 09/28/2023, 12:38 PM

## 2023-10-15 ENCOUNTER — Ambulatory Visit (INDEPENDENT_AMBULATORY_CARE_PROVIDER_SITE_OTHER): Payer: Medicare PPO

## 2023-10-15 DIAGNOSIS — R55 Syncope and collapse: Secondary | ICD-10-CM | POA: Diagnosis not present

## 2023-10-15 LAB — CUP PACEART REMOTE DEVICE CHECK
Date Time Interrogation Session: 20250511233046
Implantable Pulse Generator Implant Date: 20210503

## 2023-10-19 ENCOUNTER — Encounter: Payer: Self-pay | Admitting: Cardiovascular Disease

## 2023-10-19 ENCOUNTER — Ambulatory Visit: Admitting: Cardiovascular Disease

## 2023-10-19 VITALS — BP 110/52 | HR 76 | Ht 63.0 in | Wt 137.0 lb

## 2023-10-19 DIAGNOSIS — I2510 Atherosclerotic heart disease of native coronary artery without angina pectoris: Secondary | ICD-10-CM

## 2023-10-19 DIAGNOSIS — I480 Paroxysmal atrial fibrillation: Secondary | ICD-10-CM

## 2023-10-19 DIAGNOSIS — R0789 Other chest pain: Secondary | ICD-10-CM

## 2023-10-19 DIAGNOSIS — I252 Old myocardial infarction: Secondary | ICD-10-CM

## 2023-10-19 DIAGNOSIS — I5042 Chronic combined systolic (congestive) and diastolic (congestive) heart failure: Secondary | ICD-10-CM | POA: Diagnosis not present

## 2023-10-19 DIAGNOSIS — I10 Essential (primary) hypertension: Secondary | ICD-10-CM | POA: Diagnosis not present

## 2023-10-19 NOTE — Progress Notes (Signed)
 Cardiology Office Note   Date:  10/19/2023   ID:  Thomas Moore, DOB [birth year], MRN [id number]  PCP:  [name], MD  Cardiologist:  Debborah Fairly, MD      History of Present Illness: [name] is a 68 y.o. male who presents for  Chief Complaint  Patient presents with   Follow-up    4 Weeks Follow up & Cath Results    Doing ok      Past Medical History:  Diagnosis Date   CAD (coronary artery disease) of artery bypass graft    s/p CABG x 4 in 1997   CHF (congestive heart failure) (HCC)    COPD (chronic obstructive pulmonary disease) (HCC)    Coronary artery disease    CVA (cerebral vascular accident) (HCC)    Diabetes mellitus without complication (HCC)    Dysrhythmia    GERD (gastroesophageal reflux disease)    HLD (hyperlipidemia)    Hypertension    S/P TAVR (transcatheter aortic valve replacement) 07/05/2021   with Edwards 26mm S3UR via TF approach with Dr. Arlester Ladd and Dr. Sherene Dilling   Seizures [facility])      Past Surgical History:  Procedure Laterality Date   BYPASS GRAFT ANGIOGRAPHY N/A 04/25/2021   Procedure: BYPASS GRAFT ANGIOGRAPHY;  Surgeon: Link Rice, MD;  Location: [facility] INVASIVE CV LAB;  Service: Cardiovascular;  Laterality: N/A;   CARDIAC SURGERY     CORNEAL TRANSPLANT Right    CORONARY ARTERY BYPASS GRAFT  1997   ELECTROPHYSIOLOGY STUDY N/A 09/08/2021   Procedure: ELECTROPHYSIOLOGY STUDY;  Surgeon: Boyce Byes, MD;  Location: [facility] INVASIVE CV LAB;  Service: Cardiovascular;  Laterality: N/A;   EYE SURGERY     FLEXOR TENDON REPAIR Left 07/11/2019   Procedure: FLEXOR tenolysis  REPAIR LEFT RING FINGER with tednon repair;  Surgeon: Molli Angelucci, MD;  Location: [facility] ORS;  Service: Orthopedics;  Laterality: Left;   INCISION AND DRAINAGE ABSCESS Left 05/08/2019   Procedure: INCISION AND DRAINAGE ABSCESS;  Surgeon: Arlyne Lame, MD;  Location: [facility] ORS;  Service: Orthopedics;  Laterality: Left;   INTRAOPERATIVE TRANSTHORACIC  ECHOCARDIOGRAM N/A 07/05/2021   Procedure: INTRAOPERATIVE TRANSTHORACIC ECHOCARDIOGRAM;  Surgeon: Arnoldo Lapping, MD;  Location: [facility] OR;  Service: Open Heart Surgery;  Laterality: N/A;   LEFT HEART CATH AND CORONARY ANGIOGRAPHY Left 06/09/2022   Procedure: LEFT HEART CATH AND CORONARY ANGIOGRAPHY;  Surgeon: Cherrie Cornwall, MD;  Location: [facility] INVASIVE CV LAB;  Service: Cardiovascular;  Laterality: Left;   LEFT HEART CATH AND CORONARY ANGIOGRAPHY Left 09/27/2023   Procedure: LEFT HEART CATH AND CORONARY ANGIOGRAPHY with possible coronary intervention;  Surgeon: Cherrie Cornwall, MD;  Location: [facility] INVASIVE CV LAB;  Service: Cardiovascular;  Laterality: Left;   LEFT HEART CATH AND CORS/GRAFTS ANGIOGRAPHY N/A 10/02/2019   Procedure: LEFT HEART CATH AND CORS/GRAFTS ANGIOGRAPHY;  Surgeon: Arleen Lacer, MD;  Location: [facility] INVASIVE CV LAB;  Service: Cardiovascular;  Laterality: N/A;   LEFT HEART CATH AND CORS/GRAFTS ANGIOGRAPHY N/A 07/17/2022   Procedure: LEFT HEART CATH AND CORS/GRAFTS ANGIOGRAPHY and possible PCI and stent;  Surgeon: Cherrie Cornwall, MD;  Location: [facility] INVASIVE CV LAB;  Service: Cardiovascular;  Laterality: N/A;   LOOP RECORDER INSERTION N/A 10/06/2019   Procedure: LOOP RECORDER INSERTION;  Surgeon: Tammie Fall, MD;  Location: [facility] INVASIVE CV LAB;  Service: Cardiovascular;  Laterality: N/A;   TRANSCATHETER AORTIC VALVE REPLACEMENT, TRANSFEMORAL N/A 07/05/2021   Procedure: TRANSCATHETER AORTIC VALVE REPLACEMENT, TRANSFEMORAL;  Surgeon: Arnoldo Lapping,  MD;  Location: [facility] OR;  Service: Open Heart Surgery;  Laterality: N/A;   TRIGGER FINGER RELEASE Left      Current Outpatient Medications  Medication Sig Dispense Refill   acetaminophen  (TYLENOL ) 650 MG CR tablet Take 1,300 mg by mouth every 8 (eight) hours as needed for pain.     amiodarone  (PACERONE ) 200 MG tablet Take 1 tablet (200 mg total) by mouth 2 (two) times daily. 30 tablet 3   apixaban  (ELIQUIS ) 5 MG TABS tablet Take 1  tablet (5 mg total) by mouth 2 (two) times daily. 180 tablet 1   atorvastatin  (LIPITOR ) 80 MG tablet Take 1 tablet (80 mg total) by mouth at bedtime. 30 tablet 0   Calcium  Citrate-Vitamin D  (CALCIUM  + D PO) Take 1 tablet by mouth 2 (two) times daily.     clopidogrel  (PLAVIX ) 75 MG tablet Take 1 tablet (75 mg total) by mouth daily. 30 tablet 11   divalproex  (DEPAKOTE  ER) 250 MG 24 hr tablet Take 1 tablet (250 mg total) by mouth at bedtime. 30 tablet 0   Dulaglutide  4.5 MG/0.5ML SOAJ Inject 4.5 mg into the skin once a week.     fluticasone -salmeterol (ADVAIR) 100-50 MCG/ACT AEPB Inhale into the lungs.     furosemide  (LASIX ) 20 MG tablet Take 1 tablet (20 mg total) by mouth daily. 30 tablet 1   insulin  degludec (TRESIBA) 100 UNIT/ML FlexTouch Pen Inject 10 Units into the skin daily.     isosorbide  mononitrate (IMDUR ) 30 MG 24 hr tablet Take 1/2 tab dialy 30 tablet 1   JARDIANCE  25 MG TABS tablet Take 25 mg by mouth daily.     metoprolol  succinate (TOPROL -XL) 25 MG 24 hr tablet Take 0.5 tablets (12.5 mg total) by mouth daily. 30 tablet 0   moxifloxacin (VIGAMOX) 0.5 % ophthalmic solution Place 1 drop into the right eye every 3 (three) hours.     Multiple Vitamin (MULTIVITAMIN) capsule Take 1 capsule by mouth daily.     nitroGLYCERIN  (NITROSTAT ) 0.4 MG SL tablet Place 1 tablet (0.4 mg total) under the tongue every 5 (five) minutes as needed for chest pain. 30 tablet 2   sacubitril -valsartan  (ENTRESTO ) 24-26 MG Take 1 tablet by mouth 2 (two) times daily. 60 tablet 2   trimethoprim-polymyxin b  (POLYTRIM) ophthalmic solution Place 1 drop into the right eye every 4 (four) hours.     No current facility-administered medications for this visit.   Facility-Administered Medications Ordered in Other Visits  Medication Dose Route Frequency Provider Last Rate Last Admin   sodium chloride  flush (NS) 0.9 % injection 3 mL  3 mL Intravenous Q12H Cherrie Cornwall, MD        Allergies:   Patient has no known  allergies.    Social History:   reports that he quit smoking about 41 years ago. His smoking use included cigarettes. He has never used smokeless tobacco. He reports that he does not currently use alcohol. He reports that he does not use drugs.   Family History:  family history includes Seizures in his brother.    ROS:     Review of Systems  Constitutional: Negative.   HENT: Negative.    Eyes: Negative.   Respiratory: Negative.    Gastrointestinal: Negative.   Genitourinary: Negative.   Musculoskeletal: Negative.   Skin: Negative.   Neurological: Negative.   Endo/Heme/Allergies: Negative.   Psychiatric/Behavioral: Negative.    All other systems reviewed and are negative.     All other systems are reviewed  and negative.    PHYSICAL EXAM: VS:  BP (!) 110/52   Pulse 76   Ht 5\' 3"  (1.6 m)   Wt 137 lb (62.1 kg)   SpO2 95%   BMI 24.27 kg/m  , BMI Body mass index is 24.27 kg/m. Last weight:  Wt Readings from Last 3 Encounters:  10/19/23 137 lb (62.1 kg)  09/27/23 132 lb (59.9 kg)  09/18/23 136 lb (61.7 kg)     Physical Exam Vitals reviewed.  Constitutional:      Appearance: Normal appearance. He is normal weight.  HENT:     Head: Normocephalic.     Nose: Nose normal.     Mouth/Throat:     Mouth: Mucous membranes are moist.  Eyes:     Pupils: Pupils are equal, round, and reactive to light.  Cardiovascular:     Rate and Rhythm: Normal rate and regular rhythm.     Pulses: Normal pulses.     Heart sounds: Normal heart sounds.  Pulmonary:     Effort: Pulmonary effort is normal.  Abdominal:     General: Abdomen is flat. Bowel sounds are normal.  Musculoskeletal:        General: Normal range of motion.     Cervical back: Normal range of motion.  Skin:    General: Skin is warm.  Neurological:     General: No focal deficit present.     Mental Status: He is alert.  Psychiatric:        Mood and Affect: Mood normal.       EKG:   Recent Labs: 01/15/2023:  ALT 14 09/17/2023: B Natriuretic Peptide 814.6; BUN 27; Creatinine, Ser 1.28; Hemoglobin 11.1; Platelets 150; Potassium 4.3; Sodium 141    Lipid Panel    Component Value Date/Time   CHOL 196 08/05/2023 0409   CHOL 143 02/25/2020 1126   TRIG 115 08/05/2023 0409   HDL 42 08/05/2023 0409   HDL 31 (L) 02/25/2020 1126   CHOLHDL 4.7 08/05/2023 0409   VLDL 23 08/05/2023 0409   LDLCALC 131 (H) 08/05/2023 0409   LDLCALC 78 02/25/2020 1126      Other studies Reviewed: Additional studies/ records that were reviewed today include:  Review of the above records demonstrates:       No data to display            ASSESSMENT AND PLAN:    ICD-10-CM   1. Other chest pain  R07.89    no further chest pains    2. Coronary artery disease with hx of myocardial infarct w/o hx of CABG  I25.10    I25.2     3. Chronic combined systolic and diastolic congestive heart failure (HCC)  I50.42     4. Paroxysmal atrial fibrillation (HCC)  I48.0     5. Essential hypertension  I10        Problem List Items Addressed This Visit       Cardiovascular and Mediastinum   Essential hypertension   Coronary artery disease with hx of myocardial infarct w/o hx of CABG   Paroxysmal atrial fibrillation (HCC)     Other   Chest pain - Primary   Other Visit Diagnoses       Chronic combined systolic and diastolic congestive heart failure (HCC)              Disposition:   Return in about 6 weeks (around 11/30/2023).    Total time spent: 35 minutes  Signed,  Debborah Fairly,  MD  10/19/2023 9:30 AM    [facility]

## 2023-10-21 ENCOUNTER — Ambulatory Visit: Payer: Self-pay | Admitting: Cardiology

## 2023-10-22 NOTE — Progress Notes (Signed)
 [facility] Summary Report / Loop Recorder

## 2023-11-14 ENCOUNTER — Other Ambulatory Visit: Payer: Self-pay | Admitting: Cardiovascular Disease

## 2023-11-15 ENCOUNTER — Ambulatory Visit (INDEPENDENT_AMBULATORY_CARE_PROVIDER_SITE_OTHER): Payer: Self-pay

## 2023-11-15 DIAGNOSIS — R55 Syncope and collapse: Secondary | ICD-10-CM

## 2023-11-15 LAB — CUP PACEART REMOTE DEVICE CHECK
Date Time Interrogation Session: 20250611230607
Implantable Pulse Generator Implant Date: 20210503

## 2023-11-17 ENCOUNTER — Ambulatory Visit: Payer: Self-pay | Admitting: Cardiology

## 2023-11-28 NOTE — Progress Notes (Signed)
 [facility] Summary Report / Loop Recorder

## 2023-11-30 ENCOUNTER — Ambulatory Visit: Admitting: Cardiovascular Disease

## 2023-11-30 ENCOUNTER — Encounter: Payer: Self-pay | Admitting: Cardiovascular Disease

## 2023-11-30 VITALS — BP 110/71 | HR 87 | Ht 63.0 in | Wt 134.2 lb

## 2023-11-30 DIAGNOSIS — I10 Essential (primary) hypertension: Secondary | ICD-10-CM

## 2023-11-30 DIAGNOSIS — I2510 Atherosclerotic heart disease of native coronary artery without angina pectoris: Secondary | ICD-10-CM

## 2023-11-30 DIAGNOSIS — I480 Paroxysmal atrial fibrillation: Secondary | ICD-10-CM

## 2023-11-30 DIAGNOSIS — I252 Old myocardial infarction: Secondary | ICD-10-CM

## 2023-11-30 DIAGNOSIS — R0789 Other chest pain: Secondary | ICD-10-CM | POA: Diagnosis not present

## 2023-11-30 DIAGNOSIS — I5042 Chronic combined systolic (congestive) and diastolic (congestive) heart failure: Secondary | ICD-10-CM

## 2023-11-30 NOTE — Progress Notes (Signed)
 Cardiology Office Note   Date:  11/30/2023   ID:  TAVIO BIEGEL, DOB Sep 04, 1955, MRN [id number]  PCP:  Lenon Layman ORN, MD  Cardiologist:  Denyse Bathe, MD      History of Present Illness: Thomas Moore is a 68 y.o. male who presents for  Chief Complaint  Patient presents with   Follow-up    6 week follow up    Feeling fine.      Past Medical History:  Diagnosis Date   CAD (coronary artery disease) of artery bypass graft    s/p CABG x 4 in 1997   CHF (congestive heart failure) (HCC)    COPD (chronic obstructive pulmonary disease) (HCC)    Coronary artery disease    CVA (cerebral vascular accident) (HCC)    Diabetes mellitus without complication (HCC)    Dysrhythmia    GERD (gastroesophageal reflux disease)    HLD (hyperlipidemia)    Hypertension    S/P TAVR (transcatheter aortic valve replacement) 07/05/2021   with Edwards 26mm S3UR via TF approach with Dr. Wonda and Dr. Lucas   Seizures [facility])      Past Surgical History:  Procedure Laterality Date   BYPASS GRAFT ANGIOGRAPHY N/A 04/25/2021   Procedure: BYPASS GRAFT ANGIOGRAPHY;  Surgeon: Lawyer Bernardino Cough, MD;  Location: [facility] INVASIVE CV LAB;  Service: Cardiovascular;  Laterality: N/A;   CARDIAC SURGERY     CORNEAL TRANSPLANT Right    CORONARY ARTERY BYPASS GRAFT  1997   ELECTROPHYSIOLOGY STUDY N/A 09/08/2021   Procedure: ELECTROPHYSIOLOGY STUDY;  Surgeon: Cindie Ole DASEN, MD;  Location: [facility] INVASIVE CV LAB;  Service: Cardiovascular;  Laterality: N/A;   EYE SURGERY     FLEXOR TENDON REPAIR Left 07/11/2019   Procedure: FLEXOR tenolysis  REPAIR LEFT RING FINGER with tednon repair;  Surgeon: Kathlynn Sharper, MD;  Location: [facility] ORS;  Service: Orthopedics;  Laterality: Left;   INCISION AND DRAINAGE ABSCESS Left 05/08/2019   Procedure: INCISION AND DRAINAGE ABSCESS;  Surgeon: Mardee Lynwood SQUIBB, MD;  Location: [facility] ORS;  Service: Orthopedics;  Laterality: Left;   INTRAOPERATIVE TRANSTHORACIC ECHOCARDIOGRAM  N/A 07/05/2021   Procedure: INTRAOPERATIVE TRANSTHORACIC ECHOCARDIOGRAM;  Surgeon: Wonda Sharper, MD;  Location[facility];  Service: Open Heart Surgery;  Laterality: N/A;   LEFT HEART CATH AND CORONARY ANGIOGRAPHY Left 06/09/2022   Procedure: LEFT HEART CATH AND CORONARY ANGIOGRAPHY;  Surgeon: Bathe Denyse LABOR, MD;  Location: [facility] INVASIVE CV LAB;  Service: Cardiovascular;  Laterality: Left;   LEFT HEART CATH AND CORONARY ANGIOGRAPHY Left 09/27/2023   Procedure: LEFT HEART CATH AND CORONARY ANGIOGRAPHY with possible coronary intervention;  Surgeon: Bathe Denyse LABOR, MD;  Location: [facility] INVASIVE CV LAB;  Service: Cardiovascular;  Laterality: Left;   LEFT HEART CATH AND CORS/GRAFTS ANGIOGRAPHY N/A 10/02/2019   Procedure: LEFT HEART CATH AND CORS/GRAFTS ANGIOGRAPHY;  Surgeon: Anner Alm ORN, MD;  Location: [facility] INVASIVE CV LAB;  Service: Cardiovascular;  Laterality: N/A;   LEFT HEART CATH AND CORS/GRAFTS ANGIOGRAPHY N/A 07/17/2022   Procedure: LEFT HEART CATH AND CORS/GRAFTS ANGIOGRAPHY and possible PCI and stent;  Surgeon: Bathe Denyse LABOR, MD;  Location: [facility] INVASIVE CV LAB;  Service: Cardiovascular;  Laterality: N/A;   LOOP RECORDER INSERTION N/A 10/06/2019   Procedure: LOOP RECORDER INSERTION;  Surgeon: Waddell Danelle ORN, MD;  Location: [facility] INVASIVE CV LAB;  Service: Cardiovascular;  Laterality: N/A;   TRANSCATHETER AORTIC VALVE REPLACEMENT, TRANSFEMORAL N/A 07/05/2021   Procedure: TRANSCATHETER AORTIC VALVE REPLACEMENT, TRANSFEMORAL;  Surgeon: Wonda Sharper, MD;  Location:  [facility] OR;  Service: Open Heart Surgery;  Laterality: N/A;   TRIGGER FINGER RELEASE Left      Current Outpatient Medications  Medication Sig Dispense Refill   acetaminophen  (TYLENOL ) 650 MG CR tablet Take 1,300 mg by mouth every 8 (eight) hours as needed for pain.     amiodarone  (PACERONE ) 200 MG tablet Take 1 tablet (200 mg total) by mouth 2 (two) times daily. 30 tablet 3   apixaban  (ELIQUIS ) 5 MG TABS tablet Take 1 tablet (5 mg total)  by mouth 2 (two) times daily. 180 tablet 1   atorvastatin  (LIPITOR ) 80 MG tablet Take 1 tablet (80 mg total) by mouth at bedtime. 30 tablet 0   Calcium  Citrate-Vitamin D  (CALCIUM  + D PO) Take 1 tablet by mouth 2 (two) times daily.     clopidogrel  (PLAVIX ) 75 MG tablet Take 1 tablet by mouth once daily 30 tablet 0   divalproex  (DEPAKOTE  ER) 250 MG 24 hr tablet Take 1 tablet (250 mg total) by mouth at bedtime. 30 tablet 0   Dulaglutide  4.5 MG/0.5ML SOAJ Inject 4.5 mg into the skin once a week.     fluticasone -salmeterol (ADVAIR) 100-50 MCG/ACT AEPB Inhale into the lungs.     furosemide  (LASIX ) 20 MG tablet Take 1 tablet (20 mg total) by mouth daily. 30 tablet 1   insulin  degludec (TRESIBA) 100 UNIT/ML FlexTouch Pen Inject 10 Units into the skin daily.     isosorbide  mononitrate (IMDUR ) 30 MG 24 hr tablet Take 1/2 tab dialy 30 tablet 1   JARDIANCE  25 MG TABS tablet Take 25 mg by mouth daily.     metoprolol  succinate (TOPROL -XL) 25 MG 24 hr tablet Take 0.5 tablets (12.5 mg total) by mouth daily. 30 tablet 0   moxifloxacin (VIGAMOX) 0.5 % ophthalmic solution Place 1 drop into the right eye every 3 (three) hours.     Multiple Vitamin (MULTIVITAMIN) capsule Take 1 capsule by mouth daily.     nitroGLYCERIN  (NITROSTAT ) 0.4 MG SL tablet Place 1 tablet (0.4 mg total) under the tongue every 5 (five) minutes as needed for chest pain. 30 tablet 2   sacubitril -valsartan  (ENTRESTO ) 24-26 MG Take 1 tablet by mouth 2 (two) times daily. 60 tablet 2   trimethoprim-polymyxin b  (POLYTRIM) ophthalmic solution Place 1 drop into the right eye every 4 (four) hours.     No current facility-administered medications for this visit.   Facility-Administered Medications Ordered in Other Visits  Medication Dose Route Frequency Provider Last Rate Last Admin   sodium chloride  flush (NS) 0.9 % injection 3 mL  3 mL Intravenous Q12H Fernand Denyse LABOR, MD        Allergies:   Patient has no known allergies.    Social History:    reports that he quit smoking about 41 years ago. His smoking use included cigarettes. He has never used smokeless tobacco. He reports that he does not currently use alcohol. He reports that he does not use drugs.   Family History:  family history includes Seizures in his brother.    ROS:     Review of Systems  Constitutional: Negative.   HENT: Negative.    Eyes: Negative.   Respiratory: Negative.    Gastrointestinal: Negative.   Genitourinary: Negative.   Musculoskeletal: Negative.   Skin: Negative.   Neurological: Negative.   Endo/Heme/Allergies: Negative.   Psychiatric/Behavioral: Negative.    All other systems reviewed and are negative.     All other systems are reviewed and negative.  PHYSICAL EXAM: VS:  BP 110/71   Pulse 87   Ht 5' 3 (1.6 m)   Wt 134 lb 3.2 oz (60.9 kg)   SpO2 97%   BMI 23.77 kg/m  , BMI Body mass index is 23.77 kg/m. Last weight:  Wt Readings from Last 3 Encounters:  11/30/23 134 lb 3.2 oz (60.9 kg)  10/19/23 137 lb (62.1 kg)  09/27/23 132 lb (59.9 kg)     Physical Exam Vitals reviewed.  Constitutional:      Appearance: Normal appearance. He is normal weight.  HENT:     Head: Normocephalic.     Nose: Nose normal.     Mouth/Throat:     Mouth: Mucous membranes are moist.   Eyes:     Pupils: Pupils are equal, round, and reactive to light.    Cardiovascular:     Rate and Rhythm: Normal rate and regular rhythm.     Pulses: Normal pulses.     Heart sounds: Normal heart sounds.  Pulmonary:     Effort: Pulmonary effort is normal.  Abdominal:     General: Abdomen is flat. Bowel sounds are normal.   Musculoskeletal:        General: Normal range of motion.     Cervical back: Normal range of motion.   Skin:    General: Skin is warm.   Neurological:     General: No focal deficit present.     Mental Status: He is alert.   Psychiatric:        Mood and Affect: Mood normal.       EKG:   Recent Labs: 01/15/2023: ALT  14 09/17/2023: B Natriuretic Peptide 814.6; BUN 27; Creatinine, Ser 1.28; Hemoglobin 11.1; Platelets 150; Potassium 4.3; Sodium 141    Lipid Panel    Component Value Date/Time   CHOL 196 08/05/2023 0409   CHOL 143 02/25/2020 1126   TRIG 115 08/05/2023 0409   HDL 42 08/05/2023 0409   HDL 31 (L) 02/25/2020 1126   CHOLHDL 4.7 08/05/2023 0409   VLDL 23 08/05/2023 0409   LDLCALC 131 (H) 08/05/2023 0409   LDLCALC 78 02/25/2020 1126      Other studies Reviewed: Additional studies/ records that were reviewed today include:  Review of the above records demonstrates:       No data to display            ASSESSMENT AND PLAN:    ICD-10-CM   1. Other chest pain  R07.89    No further left arm pains.    2. Coronary artery disease with hx of myocardial infarct w/o hx of CABG  I25.10    I25.2    SVG to PDA occluded but LIMA to LAD and SVG to OM patent. Distal RCA gets collaterals from Left coronary on cath 4/25.    3. Paroxysmal atrial fibrillation (HCC)  I48.0     4. Chronic combined systolic and diastolic congestive heart failure (HCC)  I50.42     5. Essential hypertension  I10        Problem List Items Addressed This Visit       Cardiovascular and Mediastinum   Essential hypertension   Coronary artery disease with hx of myocardial infarct w/o hx of CABG   Paroxysmal atrial fibrillation (HCC)     Other   Chest pain - Primary   Other Visit Diagnoses       Chronic combined systolic and diastolic congestive heart failure (HCC)  Disposition:   Return in about 3 months (around 03/01/2024).    Total time spent: 30 minutes  Signed,  Denyse Bathe, MD  11/30/2023 10:02 AM    [facility]

## 2023-12-10 ENCOUNTER — Other Ambulatory Visit: Payer: Self-pay | Admitting: Cardiovascular Disease

## 2023-12-17 ENCOUNTER — Ambulatory Visit: Payer: Self-pay | Admitting: Cardiology

## 2023-12-17 ENCOUNTER — Ambulatory Visit: Payer: Self-pay

## 2023-12-17 DIAGNOSIS — R55 Syncope and collapse: Secondary | ICD-10-CM

## 2023-12-17 LAB — CUP PACEART REMOTE DEVICE CHECK
Date Time Interrogation Session: 20250713231313
Implantable Pulse Generator Implant Date: 20210503

## 2024-01-08 ENCOUNTER — Other Ambulatory Visit: Payer: Self-pay

## 2024-01-08 ENCOUNTER — Other Ambulatory Visit: Payer: Self-pay | Admitting: Cardiovascular Disease

## 2024-01-08 NOTE — Progress Notes (Signed)
 [facility] Summary Report / Loop Recorder

## 2024-01-11 ENCOUNTER — Other Ambulatory Visit: Payer: Self-pay

## 2024-01-11 DIAGNOSIS — I2510 Atherosclerotic heart disease of native coronary artery without angina pectoris: Secondary | ICD-10-CM

## 2024-01-11 DIAGNOSIS — I480 Paroxysmal atrial fibrillation: Secondary | ICD-10-CM

## 2024-01-11 DIAGNOSIS — R0602 Shortness of breath: Secondary | ICD-10-CM

## 2024-01-11 DIAGNOSIS — I10 Essential (primary) hypertension: Secondary | ICD-10-CM

## 2024-01-11 DIAGNOSIS — I5042 Chronic combined systolic (congestive) and diastolic (congestive) heart failure: Secondary | ICD-10-CM

## 2024-01-16 ENCOUNTER — Other Ambulatory Visit: Payer: Self-pay

## 2024-01-16 ENCOUNTER — Observation Stay
Admission: EM | Admit: 2024-01-16 | Discharge: 2024-01-17 | Disposition: A | Discharge status: Home / Self Care | Attending: Internal Medicine | Admitting: Internal Medicine

## 2024-01-16 ENCOUNTER — Emergency Department

## 2024-01-16 DIAGNOSIS — R748 Abnormal levels of other serum enzymes: Secondary | ICD-10-CM | POA: Diagnosis present

## 2024-01-16 DIAGNOSIS — D696 Thrombocytopenia, unspecified: Secondary | ICD-10-CM | POA: Diagnosis not present

## 2024-01-16 DIAGNOSIS — R079 Chest pain, unspecified: Principal | ICD-10-CM | POA: Diagnosis present

## 2024-01-16 DIAGNOSIS — E785 Hyperlipidemia, unspecified: Secondary | ICD-10-CM | POA: Diagnosis present

## 2024-01-16 DIAGNOSIS — G8929 Other chronic pain: Secondary | ICD-10-CM | POA: Diagnosis not present

## 2024-01-16 DIAGNOSIS — G43909 Migraine, unspecified, not intractable, without status migrainosus: Secondary | ICD-10-CM | POA: Insufficient documentation

## 2024-01-16 DIAGNOSIS — Z7902 Long term (current) use of antithrombotics/antiplatelets: Secondary | ICD-10-CM | POA: Diagnosis not present

## 2024-01-16 DIAGNOSIS — N1831 Chronic kidney disease, stage 3a: Secondary | ICD-10-CM | POA: Diagnosis not present

## 2024-01-16 DIAGNOSIS — I480 Paroxysmal atrial fibrillation: Secondary | ICD-10-CM | POA: Diagnosis present

## 2024-01-16 DIAGNOSIS — R0789 Other chest pain: Principal | ICD-10-CM | POA: Insufficient documentation

## 2024-01-16 DIAGNOSIS — Z79899 Other long term (current) drug therapy: Secondary | ICD-10-CM | POA: Insufficient documentation

## 2024-01-16 DIAGNOSIS — I5042 Chronic combined systolic (congestive) and diastolic (congestive) heart failure: Secondary | ICD-10-CM | POA: Diagnosis not present

## 2024-01-16 DIAGNOSIS — I2510 Atherosclerotic heart disease of native coronary artery without angina pectoris: Secondary | ICD-10-CM | POA: Diagnosis present

## 2024-01-16 DIAGNOSIS — I10 Essential (primary) hypertension: Secondary | ICD-10-CM | POA: Diagnosis not present

## 2024-01-16 DIAGNOSIS — Z955 Presence of coronary angioplasty implant and graft: Secondary | ICD-10-CM | POA: Diagnosis not present

## 2024-01-16 DIAGNOSIS — J449 Chronic obstructive pulmonary disease, unspecified: Secondary | ICD-10-CM | POA: Insufficient documentation

## 2024-01-16 DIAGNOSIS — Z7901 Long term (current) use of anticoagulants: Secondary | ICD-10-CM | POA: Diagnosis not present

## 2024-01-16 DIAGNOSIS — Z8673 Personal history of transient ischemic attack (TIA), and cerebral infarction without residual deficits: Secondary | ICD-10-CM | POA: Insufficient documentation

## 2024-01-16 DIAGNOSIS — E1122 Type 2 diabetes mellitus with diabetic chronic kidney disease: Secondary | ICD-10-CM | POA: Insufficient documentation

## 2024-01-16 DIAGNOSIS — E1129 Type 2 diabetes mellitus with other diabetic kidney complication: Secondary | ICD-10-CM | POA: Diagnosis present

## 2024-01-16 DIAGNOSIS — I130 Hypertensive heart and chronic kidney disease with heart failure and stage 1 through stage 4 chronic kidney disease, or unspecified chronic kidney disease: Secondary | ICD-10-CM | POA: Insufficient documentation

## 2024-01-16 DIAGNOSIS — Z951 Presence of aortocoronary bypass graft: Secondary | ICD-10-CM | POA: Insufficient documentation

## 2024-01-16 DIAGNOSIS — Z87891 Personal history of nicotine dependence: Secondary | ICD-10-CM | POA: Diagnosis not present

## 2024-01-16 DIAGNOSIS — Z86711 Personal history of pulmonary embolism: Secondary | ICD-10-CM | POA: Diagnosis not present

## 2024-01-16 DIAGNOSIS — Z87898 Personal history of other specified conditions: Secondary | ICD-10-CM

## 2024-01-16 DIAGNOSIS — Z952 Presence of prosthetic heart valve: Secondary | ICD-10-CM | POA: Insufficient documentation

## 2024-01-16 DIAGNOSIS — Z7982 Long term (current) use of aspirin: Secondary | ICD-10-CM | POA: Diagnosis not present

## 2024-01-16 LAB — CBC
HCT: 40.1 % (ref 39.0–52.0)
Hemoglobin: 12.8 g/dL — ABNORMAL LOW (ref 13.0–17.0)
MCH: 28.1 pg (ref 26.0–34.0)
MCHC: 31.9 g/dL (ref 30.0–36.0)
MCV: 88.1 fL (ref 80.0–100.0)
Platelets: 115 K/uL — ABNORMAL LOW (ref 150–400)
RBC: 4.55 MIL/uL (ref 4.22–5.81)
RDW: 15.9 % — ABNORMAL HIGH (ref 11.5–15.5)
WBC: 4.1 K/uL (ref 4.0–10.5)
nRBC: 0 % (ref 0.0–0.2)

## 2024-01-16 LAB — TRIGLYCERIDES: Triglycerides: 69 mg/dL (ref <150)

## 2024-01-16 LAB — TROPONIN I (HIGH SENSITIVITY)
Troponin I (High Sensitivity): 17 ng/L (ref <18)
Troponin I (High Sensitivity): 22 ng/L — ABNORMAL HIGH (ref <18)

## 2024-01-16 LAB — BASIC METABOLIC PANEL WITH GFR
Anion gap: 11 (ref 5–15)
BUN: 18 mg/dL (ref 8–23)
CO2: 25 mmol/L (ref 22–32)
Calcium: 9 mg/dL (ref 8.9–10.3)
Chloride: 103 mmol/L (ref 98–111)
Creatinine, Ser: 1.44 mg/dL — ABNORMAL HIGH (ref 0.61–1.24)
GFR, Estimated: 53 mL/min — ABNORMAL LOW (ref >60)
Glucose, Bld: 170 mg/dL — ABNORMAL HIGH (ref 70–99)
Potassium: 3.7 mmol/L (ref 3.5–5.1)
Sodium: 139 mmol/L (ref 135–145)

## 2024-01-16 LAB — PROTIME-INR
INR: 1.3 — ABNORMAL HIGH (ref 0.8–1.2)
Prothrombin Time: 16.6 s — ABNORMAL HIGH (ref 11.4–15.2)

## 2024-01-16 LAB — HEPATIC FUNCTION PANEL
ALT: 20 U/L (ref 0–44)
AST: 24 U/L (ref 15–41)
Albumin: 3.5 g/dL (ref 3.5–5.0)
Alkaline Phosphatase: 112 U/L (ref 38–126)
Bilirubin, Direct: <0.1 mg/dL (ref 0.0–0.2)
Total Bilirubin: 0.3 mg/dL (ref 0.0–1.2)
Total Protein: 8.1 g/dL (ref 6.5–8.1)

## 2024-01-16 LAB — BRAIN NATRIURETIC PEPTIDE: B Natriuretic Peptide: 798.4 pg/mL — ABNORMAL HIGH (ref 0.0–100.0)

## 2024-01-16 LAB — LIPASE, BLOOD: Lipase: 80 U/L — ABNORMAL HIGH (ref 11–51)

## 2024-01-16 LAB — HEPARIN LEVEL (UNFRACTIONATED): Heparin Unfractionated: >1.1 [IU]/mL — ABNORMAL HIGH (ref 0.30–0.70)

## 2024-01-16 LAB — APTT: aPTT: 109 s — ABNORMAL HIGH (ref 24–36)

## 2024-01-16 LAB — CBG MONITORING, ED: Glucose-Capillary: 84 mg/dL (ref 70–99)

## 2024-01-16 MED ORDER — HEPARIN (PORCINE) 25000 UT/250ML-% IV SOLN
850.0000 [IU]/h | INTRAVENOUS | Status: DC
  Administered 2024-01-16 (×2): 700 [IU]/h via INTRAVENOUS
  Filled 2024-01-16: qty 250

## 2024-01-16 MED ORDER — INSULIN ASPART 100 UNIT/ML IJ SOLN: 0.0000 [IU] | Freq: Every day | INTRAMUSCULAR | Status: DC

## 2024-01-16 MED ORDER — SACUBITRIL-VALSARTAN 24-26 MG PO TABS
1.0000 | ORAL_TABLET | Freq: Two times a day (BID) | ORAL | Status: DC
  Administered 2024-01-16 – 2024-01-17 (×3): 1 via ORAL
  Filled 2024-01-16 (×3): qty 1

## 2024-01-16 MED ORDER — MORPHINE SULFATE (PF) 2 MG/ML IV SOLN: 2.0000 mg | INTRAVENOUS | Status: DC | PRN

## 2024-01-16 MED ORDER — NITROGLYCERIN 0.4 MG SL SUBL: 0.4000 mg | SUBLINGUAL_TABLET | SUBLINGUAL | Status: DC | PRN

## 2024-01-16 MED ORDER — ALBUTEROL SULFATE HFA 108 (90 BASE) MCG/ACT IN AERS: 2.0000 | INHALATION_SPRAY | RESPIRATORY_TRACT | Status: DC | PRN

## 2024-01-16 MED ORDER — ACETAMINOPHEN 325 MG PO TABS: 650.0000 mg | ORAL_TABLET | Freq: Four times a day (QID) | ORAL | Status: DC | PRN

## 2024-01-16 MED ORDER — ADULT MULTIVITAMIN W/MINERALS CH
1.0000 | ORAL_TABLET | Freq: Every day | ORAL | Status: DC
  Administered 2024-01-17: 1 via ORAL
  Filled 2024-01-16: qty 1

## 2024-01-16 MED ORDER — OXYCODONE-ACETAMINOPHEN 5-325 MG PO TABS: 1.0000 | ORAL_TABLET | ORAL | Status: DC | PRN

## 2024-01-16 MED ORDER — AMIODARONE HCL 200 MG PO TABS
200.0000 mg | ORAL_TABLET | Freq: Two times a day (BID) | ORAL | Status: DC
  Administered 2024-01-16 – 2024-01-17 (×3): 200 mg via ORAL
  Filled 2024-01-16 (×2): qty 1

## 2024-01-16 MED ORDER — HYDRALAZINE HCL 20 MG/ML IJ SOLN: 5.0000 mg | INTRAMUSCULAR | Status: DC | PRN

## 2024-01-16 MED ORDER — CLOPIDOGREL BISULFATE 75 MG PO TABS
75.0000 mg | ORAL_TABLET | Freq: Every day | ORAL | Status: DC
  Administered 2024-01-17: 75 mg via ORAL
  Filled 2024-01-16: qty 1

## 2024-01-16 MED ORDER — DIVALPROEX SODIUM ER 250 MG PO TB24
250.0000 mg | ORAL_TABLET | Freq: Every day | ORAL | Status: DC
  Administered 2024-01-16 (×2): 250 mg via ORAL
  Filled 2024-01-16: qty 1

## 2024-01-16 MED ORDER — FUROSEMIDE 40 MG PO TABS
20.0000 mg | ORAL_TABLET | Freq: Every day | ORAL | Status: DC
  Administered 2024-01-17: 20 mg via ORAL
  Filled 2024-01-16: qty 1

## 2024-01-16 MED ORDER — HEPARIN BOLUS VIA INFUSION
1800.0000 [IU] | Freq: Once | INTRAVENOUS | Status: AC
  Administered 2024-01-16 (×2): 1800 [IU] via INTRAVENOUS
  Filled 2024-01-16: qty 1800

## 2024-01-16 MED ORDER — ATORVASTATIN CALCIUM 20 MG PO TABS
80.0000 mg | ORAL_TABLET | Freq: Every day | ORAL | Status: DC
  Administered 2024-01-16 (×2): 80 mg via ORAL
  Filled 2024-01-16: qty 4

## 2024-01-16 MED ORDER — ALBUTEROL SULFATE (2.5 MG/3ML) 0.083% IN NEBU: 2.5000 mg | INHALATION_SOLUTION | RESPIRATORY_TRACT | Status: DC | PRN

## 2024-01-16 MED ORDER — INSULIN ASPART 100 UNIT/ML IJ SOLN: 0.0000 [IU] | Freq: Three times a day (TID) | INTRAMUSCULAR | Status: DC

## 2024-01-16 MED ORDER — ASPIRIN 81 MG PO CHEW
324.0000 mg | CHEWABLE_TABLET | Freq: Once | ORAL | Status: AC
  Administered 2024-01-16 (×2): 324 mg via ORAL
  Filled 2024-01-16: qty 4

## 2024-01-16 MED ORDER — FLUTICASONE FUROATE-VILANTEROL 100-25 MCG/ACT IN AEPB
1.0000 | INHALATION_SPRAY | Freq: Every day | RESPIRATORY_TRACT | Status: DC
  Administered 2024-01-17: 1 via RESPIRATORY_TRACT
  Filled 2024-01-16 (×2): qty 28

## 2024-01-16 MED ORDER — OYSTER SHELL CALCIUM/D3 500-5 MG-MCG PO TABS
1.0000 | ORAL_TABLET | Freq: Two times a day (BID) | ORAL | Status: DC
  Administered 2024-01-17: 1 via ORAL
  Filled 2024-01-16: qty 1

## 2024-01-16 MED ORDER — MIDAZOLAM HCL (PF) 10 MG/2ML IJ SOLN: 2.0000 mg | INTRAMUSCULAR | Status: DC | PRN

## 2024-01-16 MED ORDER — ONDANSETRON HCL 4 MG/2ML IJ SOLN: 4.0000 mg | Freq: Three times a day (TID) | INTRAMUSCULAR | Status: DC | PRN

## 2024-01-16 MED ORDER — DM-GUAIFENESIN ER 30-600 MG PO TB12: 1.0000 | ORAL_TABLET | Freq: Two times a day (BID) | ORAL | Status: DC | PRN

## 2024-01-16 NOTE — ED Notes (Signed)
 Pt transported to CPOD to room 36 via stretcher by this nurse with monitoring equipment.

## 2024-01-16 NOTE — ED Triage Notes (Signed)
 Comes from home via [facility]. C/O stomach pain and chest pain with BM today.  Hx multiple MI, CVA, and now left arm pain / numbness.  179/65 EKG 70 269 CBG

## 2024-01-16 NOTE — Discharge Instructions (Signed)
 You are seen in the emergency department

## 2024-01-16 NOTE — ED Provider Triage Note (Signed)
 Emergency Medicine Provider Triage Evaluation Note  Thomas Moore , a 68 y.o. male  was evaluated in triage.  Pt complains of chest pain and stomach pain with bowel movement complex past medical history..   Physical Exam  BP 132/73   Pulse 60   Temp 98.7 F (37.1 C) (Oral)   Resp 18   Wt 60.9 kg   SpO2 100%   BMI 23.78 kg/m  Gen:   Awake, no distress   Resp:  Normal effort  MSK:   Moves extremities without difficulty  Other:    Medical Decision Making  Medically screening exam initiated at 3:22 PM.  Appropriate orders placed.  Thomas Moore was informed that the remainder of the evaluation will be completed by another provider, this initial triage assessment does not replace that evaluation, and the importance of remaining in the ED until their evaluation is complete.  Cardiac workup in process   Thomas Kirk NOVAK, FNP 01/16/24 1748

## 2024-01-16 NOTE — H&P (Signed)
 History and Physical    Thomas Moore FMW:[id number] DOB: 1955-10-25 DOA: 01/16/2024  Referring MD/NP/PA:   PCP: Lenon Layman ORN, MD   Patient coming from:  The patient is coming from home.     Chief Complaint: chest pain  HPI: Thomas Moore is a 68 y.o. male with medical history significant of A fib and PE on Eliquis , dCHF, CAD, s/p of CABG 1997, HTN, HLD, DM, stroke, CKD-3a, SVT, thrombocytopenia, aortic stenosis (s/p of TAVR), bilateral carotid artery stenosis, seizure, chronic back pain, kidney stone (s/p of ESWL), loop recorder, who presents with chest pain.  Pt is a very poor historian, does not answer questions straight.  I have tried to call his family, but nobody picked up the phone. The history is limited.    Patient stated his chest pain started earlier today, which is located in the front chest, seem to be mild to moderate, radiating to the left arm, making left arm pain and tingling.  Denies SOB, cough, fever or chills.  Per nurse report, patient complained abdominal pain, but he denies abdominal pain to me. No nausea, vomiting, diarrhea.  No symptoms of UTI. Patient is followed by Dr. Fernand with cardiology.  On chart review patient had a recent cardiac catheterization within the past year that was not amenable to PCI.   Data reviewed independently and ED Course: pt was found to have 17 --> 22, WBC 4.1, stable renal function, temperature normal, blood pressure 159/78, heart rate 80, RR 18, oxygen saturation 100% on room air.  Chest x-ray negative.  Patient is placed in telemetry bed for observation.  Dr. Ammon of cardiology is consulted.   EKG: I have personally reviewed.  Sinus rhythm, QTc 424, ST depression in inferior leads, V4-V6.   Review of Systems:   General: no fevers, chills, no body weight gain, has fatigue HEENT: no blurry vision, hearing changes or sore throat Respiratory: no dyspnea, coughing, wheezing CV: has chest pain, no palpitations GI: no  nausea, vomiting, has possible abdominal pain, no diarrhea, constipation GU: no dysuria, burning on urination, increased urinary frequency, hematuria  Ext: has trace leg edema Neuro: no unilateral weakness, numbness, or tingling, no vision change or hearing loss Skin: no rash, no skin tear. MSK: No muscle spasm, no deformity, no limitation of range of movement in spin Heme: No easy bruising.  Travel history: No recent long distant travel.   Allergy: No Known Allergies  Past Medical History:  Diagnosis Date   CAD (coronary artery disease) of artery bypass graft    s/p CABG x 4 in 1997   CHF (congestive heart failure) (HCC)    COPD (chronic obstructive pulmonary disease) (HCC)    Coronary artery disease    CVA (cerebral vascular accident) (HCC)    Diabetes mellitus without complication (HCC)    Dysrhythmia    GERD (gastroesophageal reflux disease)    HLD (hyperlipidemia)    Hypertension    S/P TAVR (transcatheter aortic valve replacement) 07/05/2021   with Edwards 26mm S3UR via TF approach with Dr. Wonda and Dr. Lucas   Seizures [facility])     Past Surgical History:  Procedure Laterality Date   BYPASS GRAFT ANGIOGRAPHY N/A 04/25/2021   Procedure: BYPASS GRAFT ANGIOGRAPHY;  Surgeon: Lawyer Bernardino Cough, MD;  Location: [facility] INVASIVE CV LAB;  Service: Cardiovascular;  Laterality: N/A;   CARDIAC SURGERY     CORNEAL TRANSPLANT Right    CORONARY ARTERY BYPASS GRAFT  1997   ELECTROPHYSIOLOGY STUDY N/A 09/08/2021  Procedure: ELECTROPHYSIOLOGY STUDY;  Surgeon: Cindie Ole DASEN, MD;  Location: [facility] INVASIVE CV LAB;  Service: Cardiovascular;  Laterality: N/A;   EYE SURGERY     FLEXOR TENDON REPAIR Left 07/11/2019   Procedure: FLEXOR tenolysis  REPAIR LEFT RING FINGER with tednon repair;  Surgeon: Kathlynn Sharper, MD;  Location: [facility] ORS;  Service: Orthopedics;  Laterality: Left;   INCISION AND DRAINAGE ABSCESS Left 05/08/2019   Procedure: INCISION AND DRAINAGE ABSCESS;  Surgeon: Mardee Lynwood SQUIBB, MD;  Location: [facility] ORS;  Service: Orthopedics;  Laterality: Left;   INTRAOPERATIVE TRANSTHORACIC ECHOCARDIOGRAM N/A 07/05/2021   Procedure: INTRAOPERATIVE TRANSTHORACIC ECHOCARDIOGRAM;  Surgeon: Wonda Sharper, MD;  Location: [facility] OR;  Service: Open Heart Surgery;  Laterality: N/A;   LEFT HEART CATH AND CORONARY ANGIOGRAPHY Left 06/09/2022   Procedure: LEFT HEART CATH AND CORONARY ANGIOGRAPHY;  Surgeon: Fernand Denyse LABOR, MD;  Location: [facility] INVASIVE CV LAB;  Service: Cardiovascular;  Laterality: Left;   LEFT HEART CATH AND CORONARY ANGIOGRAPHY Left 09/27/2023   Procedure: LEFT HEART CATH AND CORONARY ANGIOGRAPHY with possible coronary intervention;  Surgeon: Fernand Denyse LABOR, MD;  Location: [facility] INVASIVE CV LAB;  Service: Cardiovascular;  Laterality: Left;   LEFT HEART CATH AND CORS/GRAFTS ANGIOGRAPHY N/A 10/02/2019   Procedure: LEFT HEART CATH AND CORS/GRAFTS ANGIOGRAPHY;  Surgeon: Anner Alm ORN, MD;  Location: [facility] INVASIVE CV LAB;  Service: Cardiovascular;  Laterality: N/A;   LEFT HEART CATH AND CORS/GRAFTS ANGIOGRAPHY N/A 07/17/2022   Procedure: LEFT HEART CATH AND CORS/GRAFTS ANGIOGRAPHY and possible PCI and stent;  Surgeon: Fernand Denyse LABOR, MD;  Location: [facility] INVASIVE CV LAB;  Service: Cardiovascular;  Laterality: N/A;   LOOP RECORDER INSERTION N/A 10/06/2019   Procedure: LOOP RECORDER INSERTION;  Surgeon: Waddell Danelle ORN, MD;  Location: [facility] INVASIVE CV LAB;  Service: Cardiovascular;  Laterality: N/A;   TRANSCATHETER AORTIC VALVE REPLACEMENT, TRANSFEMORAL N/A 07/05/2021   Procedure: TRANSCATHETER AORTIC VALVE REPLACEMENT, TRANSFEMORAL;  Surgeon: Wonda Sharper, MD;  Location: [facility] OR;  Service: Open Heart Surgery;  Laterality: N/A;   TRIGGER FINGER RELEASE Left     Social History:  reports that he quit smoking about 41 years ago. His smoking use included cigarettes. He has never used smokeless tobacco. He reports that he does not currently use alcohol. He reports that he does not use  drugs.  Family History:  Family History  Problem Relation Age of Onset   Seizures Brother      Prior to Admission medications   Medication Sig Start Date End Date Taking? Authorizing Provider  acetaminophen  (TYLENOL ) 650 MG CR tablet Take 1,300 mg by mouth every 8 (eight) hours as needed for pain.    [provider]  amiodarone  (PACERONE ) 200 MG tablet Take 1 tablet (200 mg total) by mouth 2 (two) times daily. 09/13/23   Fernand Denyse LABOR, MD  apixaban  (ELIQUIS ) 5 MG TABS tablet Take 1 tablet (5 mg total) by mouth 2 (two) times daily. 11/14/22   Riddle, Suzann, NP  atorvastatin  (LIPITOR ) 80 MG tablet Take 1 tablet (80 mg total) by mouth at bedtime. 08/06/23   Barbarann Nest, MD  Calcium  Citrate-Vitamin D  (CALCIUM  + D PO) Take 1 tablet by mouth 2 (two) times daily.    [provider]  clopidogrel  (PLAVIX ) 75 MG tablet Take 1 tablet by mouth once daily 01/08/24   Fernand Denyse LABOR, MD  divalproex  (DEPAKOTE  ER) 250 MG 24 hr tablet Take 1 tablet (250 mg total) by mouth at bedtime. 08/06/23   Barbarann Nest, MD  Dulaglutide  4.5 MG/0.5ML SOAJ Inject 4.5 mg into the skin once a week. 05/10/23   [provider]  fluticasone -salmeterol (ADVAIR) 100-50 MCG/ACT AEPB Inhale into the lungs. 06/25/23   [provider]  furosemide  (LASIX ) 20 MG tablet Take 1 tablet (20 mg total) by mouth daily. 05/23/23   Fernand Denyse LABOR, MD  insulin  degludec (TRESIBA) 100 UNIT/ML FlexTouch Pen Inject 10 Units into the skin daily. 07/11/23   [provider]  isosorbide  mononitrate (IMDUR ) 30 MG 24 hr tablet Take 1/2 tab dialy 01/25/23   Fernand Denyse LABOR, MD  JARDIANCE  25 MG TABS tablet Take 25 mg by mouth daily.    [provider]  metoprolol  succinate (TOPROL -XL) 25 MG 24 hr tablet Take 0.5 tablets (12.5 mg total) by mouth daily. 08/07/23   Barbarann Nest, MD  moxifloxacin (VIGAMOX) 0.5 % ophthalmic solution Place 1 drop into the right eye every 3 (three) hours. 09/07/23   [provider]  Multiple Vitamin (MULTIVITAMIN) capsule Take 1 capsule by mouth daily.    [provider]  nitroGLYCERIN  (NITROSTAT ) 0.4 MG SL tablet Place 1 tablet (0.4 mg total) under the tongue every 5 (five) minutes as needed for chest pain. 08/03/23   Scoggins, Amber, NP  sacubitril -valsartan  (ENTRESTO ) 24-26 MG Take 1 tablet by mouth 2 (two) times daily. 09/13/23   Fernand Denyse LABOR, MD  trimethoprim-polymyxin b  (POLYTRIM) ophthalmic solution Place 1 drop into the right eye every 4 (four) hours. 09/07/23   [provider]    Physical Exam: Vitals:   01/16/24 1724 01/16/24 2215 01/17/24 0149 01/17/24 0224  BP: (!) 159/78  127/66   Pulse: 80  87   Resp: 18  (!) 23   Temp:  98.6 F (37 C)    TempSrc:  Oral    SpO2: 100%  97%   Weight:    60.9 kg  Height:    5' 3 (1.6 m)   General: Not in acute distress HEENT:       Eyes: PERRL, EOMI, no jaundice       ENT: No discharge from the ears and nose, no pharynx injection, no tonsillar enlargement.        Neck: No JVD, no bruit, no mass felt. Heme: No neck lymph node enlargement. Cardiac: S1/S2, RRR, No gallops or rubs. Respiratory: No rales, wheezing, rhonchi or rubs. GI: Soft, nondistended, seems to have mild left upper abdominal tenderness, no rebound pain, no organomegaly, BS present. GU: No hematuria Ext: has trace leg edema bilaterally. 1+DP/PT pulse bilaterally. Musculoskeletal: No joint deformities, No joint redness or warmth, no limitation of ROM in spin. Skin: No rashes.  Neuro: Alert, following command,, cranial nerves II-XII grossly intact, moves all extremities normally.  Psych: Patient is not psychotic, no suicidal or hemocidal ideation.  Labs on Admission: I have personally reviewed following labs and imaging studies  CBC: Recent Labs  Lab 01/16/24 0943  WBC 4.1  HGB 12.8*  HCT 40.1  MCV 88.1  PLT 115*   Basic Metabolic Panel: Recent Labs  Lab 01/16/24 0943  NA 139  K 3.7  CL 103  CO2 25   GLUCOSE 170*  BUN 18  CREATININE 1.44*  CALCIUM  9.0   GFR: Estimated Creatinine Clearance: 39.5 mL/min (A) (by C-G formula based on SCr of 1.44 mg/dL (H)). Liver Function Tests: Recent Labs  Lab 01/16/24 0943  AST 24  ALT 20  ALKPHOS 112  BILITOT 0.3  PROT 8.1  ALBUMIN 3.5   Recent Labs  Lab 01/16/24 0943  LIPASE 80*   No results for input(s): AMMONIA in the last 168 hours. Coagulation Profile: Recent Labs  Lab 01/16/24 2329  INR 1.3*   Cardiac Enzymes: No results for input(s): CKTOTAL, CKMB, CKMBINDEX, TROPONINI in the last 168 hours. BNP (last 3 results) No results for input(s): PROBNP in the last 8760 hours. HbA1C: No results for input(s): HGBA1C in the last 72 hours. CBG: Recent Labs  Lab 01/16/24 2215  GLUCAP 84   Lipid Profile: Recent Labs    01/16/24 1507  TRIG 69   Thyroid  Function Tests: No results for input(s): TSH, T4TOTAL, FREET4, T3FREE, THYROIDAB in the last 72 hours. Anemia Panel: No results for input(s): VITAMINB12, FOLATE, FERRITIN, TIBC, IRON, RETICCTPCT in the last 72 hours. Urine analysis:    Component Value Date/Time   COLORURINE COLORLESS (A) 02/12/2023 1142   APPEARANCEUR CLEAR (A) 02/12/2023 1142   APPEARANCEUR Clear 05/12/2020 0940   LABSPEC 1.005 02/12/2023 1142   PHURINE 7.0 02/12/2023 1142   GLUCOSEU >=500 (A) 02/12/2023 1142   HGBUR MODERATE (A) 02/12/2023 1142   BILIRUBINUR NEGATIVE 02/12/2023 1142   BILIRUBINUR Negative 05/12/2020 0940   KETONESUR NEGATIVE 02/12/2023 1142   PROTEINUR NEGATIVE 02/12/2023 1142   NITRITE NEGATIVE 02/12/2023 1142   LEUKOCYTESUR NEGATIVE 02/12/2023 1142   Sepsis Labs: @LABRCNTIP (procalcitonin:4,lacticidven:4) )No results found for this or any previous visit (from the past 240 hours).   Radiological Exams on Admission:   Assessment/Plan Principal Problem:   Chest pain Active Problems:   CAD (coronary artery disease)   Chronic combined  systolic and diastolic CHF (congestive heart failure) (HCC)   Essential hypertension   HLD (hyperlipidemia)   Type II diabetes mellitus with renal manifestations (HCC)   Elevated lipase   Thrombocytopenia (HCC)   Chronic kidney disease, stage 3a (HCC)   History of stroke   Hx of pulmonary embolus   Paroxysmal atrial fibrillation (HCC)   History of seizure   Assessment and Plan:  Chest pain and hx of CAD: s/p of CABG 1997. Trop is slightly elevated 17 --> --> 27.  Consulted Dr. Ammon of cardiology.  Patient is following up with Dr. Fernand of cardiology.  - place to tele bed for observation - switch his Eliquis  to IV heparin  - Trend Trop - prn Morphine , Percocet, Tylenol  for pain - prn Nitroglycerin  -  pt is on plavix  - Statin: lipitor  - Risk factor stratification: will check FLP and A1C  - check UDS  Chronic combined systolic and diastolic CHF: Today: 08/06/2023 showed EF 30-35% with grade 3 diastolic dysfunction.  Patient has elevated BNP 798, but only has trace leg edema, no SOB, oxygen saturation 100% on room air.  Does not seem to have CHF exacerbation, but at high risk of developing CHF exacerbation. - Continue Lasix  20 mg daily - Give 1 dose of Lasix  40 mg now by IV - Continue Entresto   Essential hypertension -IV hydralazine  as needed - Entresto   HLD (hyperlipidemia) -Lipitor   Type II diabetes mellitus with renal manifestations (HCC): Recent A1c 6.8, well-controlled.  Patient taking Jardiance , Tresiba 10 units daily -Glargine insulin  7 units daily - SSI  Elevated lipase: Lipase is slightly elevated at 80.  Triglyceride level normal, 69.  Patient denies abdominal pain to me.  Liver function normal. -Clear diet - IV fluid: - will not give IVF due to significantly elevated BNP, 7098  Thrombocytopenia (HCC): This is chronic issue.  Platelet 115 (125 on 08/06/2023) - Follow-up CBC  Chronic kidney disease, stage 3a (HCC):  Stable. -Follow-up with BMP  History of  stroke -Plavix , Lipitor   Hx of pulmonary embolus -On IV heparin   Paroxysmal atrial fibrillation (HCC): Heart rate 80s - Amiodarone  -Switched Eliquis  to IV heparin  as above  History of seizure - Seizure precaution - As needed Versed  for seizure - Depakote         DVT ppx: in IV Heparin      Code Status: Full code   Family Communication:     not done, no family member is at bed side.    I have tried to call his family, but nobody picked up the phone.  Disposition Plan:  Anticipate discharge back to previous environment  Consults called:  Dr. Ammon of cardiology is consulted.  Admission status and Level of care: Telemetry Cardiac:    for obs     Dispo: The patient is from: Home              Anticipated d/c is to: Home              Anticipated d/c date is: 1 day              Patient currently is not medically stable to d/c.    Severity of Illness:  The appropriate patient status for this patient is OBSERVATION. Observation status is judged to be reasonable and necessary in order to provide the required intensity of service to ensure the patient's safety. The patient's presenting symptoms, physical exam findings, and initial radiographic and laboratory data in the context of their medical condition is felt to place them at decreased risk for further clinical deterioration. Furthermore, it is anticipated that the patient will be medically stable for discharge from the hospital within 2 midnights of admission.        Date of Service 01/17/2024    Caleb Exon [facility]   If 7PM-7AM, please contact night-coverage [email or web address] 01/17/2024, 2:32 AM

## 2024-01-16 NOTE — Group Note (Deleted)
 Date:  01/16/2024 Time:  2:22 PM  Group Topic/Focus:  Wellness Toolbox:   The focus of this group is to discuss various aspects of wellness, balancing those aspects and exploring ways to increase the ability to experience wellness.  Patients will create a wellness toolbox for use upon discharge.     Participation Level:  {BHH PARTICIPATION OZCZO:77735}  Participation Quality:  {BHH PARTICIPATION QUALITY:22265}  Affect:  {BHH AFFECT:22266}  Cognitive:  {BHH COGNITIVE:22267}  Insight: {BHH Insight2:20797}  Engagement in Group:  {BHH ENGAGEMENT IN HMNLE:77731}  Modes of Intervention:  {BHH MODES OF INTERVENTION:22269}  Additional Comments:  ***  Thomas Moore 01/16/2024, 2:22 PM

## 2024-01-16 NOTE — ED Provider Notes (Signed)
 [facility] Provider Note    Event Date/Time   First MD Initiated Contact with Patient 01/16/24 1725     (approximate)   History   Chest Pain   HPI  Thomas Moore is a 68 y.o. male past medical history significant for CHF, CAD with prior CABG and PCI, diabetes, hypertension, who presents to the emergency department chest pain.  Patient states that he started having chest pain earlier today that was a substernal pain that radiated down his left arm and felt like his left arm was tingling.  Denies any association with nausea, vomiting or diaphoresis.  States that he has nitroglycerin  but he did not take it.  Has not had any aspirin  today.  Denies any shortness of breath.  Denies any chest pain at this time but states that he had intermittent episodes of chest pain since being in the emergency department.  Denies any history of DVT or PE.  Endorses compliance with all of his home medications.  Patient is followed by Dr. Fernand with cardiology.  On chart review patient had a recent cardiac catheterization within the past year that was not amenable to PCI and he was started on dual antiplatelet therapy with medical management.  Patient also has a loop recorder     Physical Exam   Triage Vital Signs: ED Triage Vitals  Encounter Vitals Group     BP 01/16/24 0955 (!) 151/72     Girls Systolic BP Percentile --      Girls Diastolic BP Percentile --      Boys Systolic BP Percentile --      Boys Diastolic BP Percentile --      Pulse Rate 01/16/24 0955 73     Resp 01/16/24 1415 18     Temp 01/16/24 0955 98.2 F (36.8 C)     Temp Source 01/16/24 0955 Oral     SpO2 01/16/24 0955 100 %     Weight 01/16/24 0941 134 lb 4.2 oz (60.9 kg)     Height --      Head Circumference --      Peak Flow --      Pain Score 01/16/24 0941 3     Pain Loc --      Pain Education --      Exclude from Growth Chart --     Most recent vital signs: Vitals:   01/16/24 1415 01/16/24  1724  BP: 132/73 (!) 159/78  Pulse: 60 80  Resp: 18 18  Temp: 98.7 F (37.1 C)   SpO2: 100% 100%    Physical Exam Constitutional:      Appearance: He is well-developed.  HENT:     Head: Atraumatic.  Eyes:     Conjunctiva/sclera: Conjunctivae normal.  Cardiovascular:     Rate and Rhythm: Regular rhythm.  Pulmonary:     Effort: No respiratory distress.  Abdominal:     Palpations: Abdomen is soft.     Tenderness: There is no abdominal tenderness.  Musculoskeletal:     Cervical back: Normal range of motion.  Skin:    General: Skin is warm.  Neurological:     Mental Status: He is alert. Mental status is at baseline.     IMPRESSION / MDM / ASSESSMENT AND PLAN / ED COURSE  I reviewed the triage vital signs and the nursing notes.  Differential diagnosis including ACS, pneumonia, gastritis/PUD, pancreatitis  Low suspicion for dissection, no tearing chest pain, pulses are equal and symmetric  Given aspirin   EKG  I, Clotilda Punter, the attending physician, personally viewed and interpreted this ECG.  EKG showed normal sinus rhythm.  Nonspecific ST changes but no significant change when compared to prior EKG on April 2025.  No tachycardic or bradycardic dysrhythmias while on cardiac telemetry.  RADIOLOGY I independently reviewed imaging, my interpretation of imaging: Chest x-ray with no signs of pneumonia or pulmonary edema.  Read as no active disease and COPD  LABS (all labs ordered are listed, but only abnormal results are displayed) Labs interpreted as -    Labs Reviewed  BASIC METABOLIC PANEL WITH GFR - Abnormal; Notable for the following components:      Result Value   Glucose, Bld 170 (*)    Creatinine, Ser 1.44 (*)    GFR, Estimated 53 (*)    All other components within normal limits  CBC - Abnormal; Notable for the following components:   Hemoglobin 12.8 (*)    RDW 15.9 (*)    Platelets 115 (*)    All other components within normal limits  LIPASE, BLOOD  - Abnormal; Notable for the following components:   Lipase 80 (*)    All other components within normal limits  TROPONIN I (HIGH SENSITIVITY) - Abnormal; Notable for the following components:   Troponin I (High Sensitivity) 22 (*)    All other components within normal limits  HEPATIC FUNCTION PANEL  TROPONIN I (HIGH SENSITIVITY)  TROPONIN I (HIGH SENSITIVITY)     MDM    Patient's initial troponin was 18 and second troponin mildly elevated at 22.  No significant leukocytosis or anemia.  Creatinine is at his baseline.  No significant electrolyte abnormality.  Lipase only mildly elevated at 80, not consistent with a pancreatitis and symptoms do not fit with acute pancreatitis.  Patient again complaining of chest pain.  Was given aspirin  and repeat EKG was obtained without acute changes.  Given ongoing chest pain that has been intermittent, will admit to the hospitalist for further evaluation and evaluation with cardiology.  Currently chest pain-free at time of admission.   PROCEDURES:  Critical Care performed: No  Procedures  Patient's presentation is most consistent with acute presentation with potential threat to life or bodily function.   MEDICATIONS ORDERED IN ED: Medications  aspirin  chewable tablet 324 mg (324 mg Oral Given 01/16/24 1839)    FINAL CLINICAL IMPRESSION(S) / ED DIAGNOSES   Final diagnoses:  Chest pain, unspecified type     Rx / DC Orders   ED Discharge Orders     None        Note:  This document was prepared using Dragon voice recognition software and may include unintentional dictation errors.   Punter Clotilda, MD 01/16/24 [phone]

## 2024-01-16 NOTE — Consult Note (Addendum)
 PHARMACY - ANTICOAGULATION CONSULT NOTE  Pharmacy Consult for Heparin   Indication: hx of PE and A fib, now has possible NSTEMI  No Known Allergies  Patient Measurements: Weight: 60.9 kg (134 lb 4.2 oz)  Vital Signs: Temp: 98.7 F (37.1 C) (08/13 1415) Temp Source: Oral (08/13 1415) BP: 159/78 (08/13 1724) Pulse Rate: 80 (08/13 1724)  Labs: Recent Labs    01/16/24 0943 01/16/24 1507  HGB 12.8*  --   HCT 40.1  --   PLT 115*  --   CREATININE 1.44*  --   TROPONINIHS 17 22*    Estimated Creatinine Clearance: 39.5 mL/min (A) (by C-G formula based on SCr of 1.44 mg/dL (H)).   Medical History: Past Medical History:  Diagnosis Date   CAD (coronary artery disease) of artery bypass graft    s/p CABG x 4 in 1997   CHF (congestive heart failure) (HCC)    COPD (chronic obstructive pulmonary disease) (HCC)    Coronary artery disease    CVA (cerebral vascular accident) (HCC)    Diabetes mellitus without complication (HCC)    Dysrhythmia    GERD (gastroesophageal reflux disease)    HLD (hyperlipidemia)    Hypertension    S/P TAVR (transcatheter aortic valve replacement) 07/05/2021   with Edwards 26mm S3UR via TF approach with Dr. Wonda and Dr. Lucas   Seizures [facility])     Medications:  (Not in a hospital admission)  Scheduled:   insulin  aspart  0-5 Units Subcutaneous QHS   [START ON 01/17/2024] insulin  aspart  0-9 Units Subcutaneous TID WC   Infusions:  PRN: acetaminophen , albuterol , dextromethorphan -guaiFENesin , hydrALAZINE , midazolam  PF, morphine  injection, nitroGLYCERIN , ondansetron  (ZOFRAN ) IV, oxyCODONE -acetaminophen  Anti-infectives (From admission, onward)    None       Assessment: 68 y.o. male past medical history significant for afib, CHF, CAD with prior CABG and PCI, diabetes, hypertension, who presents to the emergency department chest pain. On apixaban  PTA. Last dose unknown. Will order baseline heparin  level. Will monitor heparin  with aPTT. Hgb and plt  stable. Trop slightly elevated at 22. EKG showed normal sinus rhythm. Nonspecific ST changes but no significant change when compared to prior EKG on April 2025.   Goal of Therapy:  Heparin  level 0.3-0.7 units/ml once aPTT and heparin  level correlate.  aPTT 66-102 seconds Monitor platelets by anticoagulation protocol: Yes   Plan:  Give 1800 units bolus x 1 (half bolus due to DOAC) Start heparin  infusion at 700 units/hr Check aPTT in 6 hours and daily heparin  level and CBC while on heparin  Continue to monitor H&H and platelets  Cathaleen GORMAN Blanch, PharmD, BCPS 01/16/2024,8:20 PM

## 2024-01-17 ENCOUNTER — Other Ambulatory Visit: Payer: Self-pay

## 2024-01-17 ENCOUNTER — Observation Stay (HOSPITAL_BASED_OUTPATIENT_CLINIC_OR_DEPARTMENT_OTHER)
Admission: EM | Admit: 2024-01-17 | Discharge: 2024-01-19 | Disposition: A | Discharge status: Home / Self Care | Source: Home / Self Care | Attending: Emergency Medicine | Admitting: Emergency Medicine

## 2024-01-17 ENCOUNTER — Ambulatory Visit (INDEPENDENT_AMBULATORY_CARE_PROVIDER_SITE_OTHER): Payer: Self-pay

## 2024-01-17 ENCOUNTER — Ambulatory Visit: Payer: Self-pay | Admitting: Cardiology

## 2024-01-17 ENCOUNTER — Encounter: Payer: Self-pay | Admitting: Emergency Medicine

## 2024-01-17 ENCOUNTER — Emergency Department

## 2024-01-17 ENCOUNTER — Observation Stay

## 2024-01-17 DIAGNOSIS — N179 Acute kidney failure, unspecified: Secondary | ICD-10-CM | POA: Diagnosis present

## 2024-01-17 DIAGNOSIS — R079 Chest pain, unspecified: Secondary | ICD-10-CM | POA: Diagnosis not present

## 2024-01-17 DIAGNOSIS — Z86711 Personal history of pulmonary embolism: Secondary | ICD-10-CM | POA: Diagnosis present

## 2024-01-17 DIAGNOSIS — N1831 Chronic kidney disease, stage 3a: Secondary | ICD-10-CM | POA: Insufficient documentation

## 2024-01-17 DIAGNOSIS — I200 Unstable angina: Secondary | ICD-10-CM | POA: Diagnosis present

## 2024-01-17 DIAGNOSIS — R55 Syncope and collapse: Secondary | ICD-10-CM | POA: Diagnosis not present

## 2024-01-17 DIAGNOSIS — J449 Chronic obstructive pulmonary disease, unspecified: Secondary | ICD-10-CM | POA: Insufficient documentation

## 2024-01-17 DIAGNOSIS — Z79899 Other long term (current) drug therapy: Secondary | ICD-10-CM | POA: Insufficient documentation

## 2024-01-17 DIAGNOSIS — E8729 Other acidosis: Secondary | ICD-10-CM | POA: Diagnosis present

## 2024-01-17 DIAGNOSIS — R9431 Abnormal electrocardiogram [ECG] [EKG]: Secondary | ICD-10-CM

## 2024-01-17 DIAGNOSIS — I480 Paroxysmal atrial fibrillation: Secondary | ICD-10-CM | POA: Diagnosis present

## 2024-01-17 DIAGNOSIS — I639 Cerebral infarction, unspecified: Secondary | ICD-10-CM | POA: Insufficient documentation

## 2024-01-17 DIAGNOSIS — Z7901 Long term (current) use of anticoagulants: Secondary | ICD-10-CM | POA: Insufficient documentation

## 2024-01-17 DIAGNOSIS — I5042 Chronic combined systolic (congestive) and diastolic (congestive) heart failure: Secondary | ICD-10-CM | POA: Diagnosis present

## 2024-01-17 DIAGNOSIS — I10 Essential (primary) hypertension: Secondary | ICD-10-CM | POA: Diagnosis not present

## 2024-01-17 DIAGNOSIS — Z952 Presence of prosthetic heart valve: Secondary | ICD-10-CM

## 2024-01-17 DIAGNOSIS — I2510 Atherosclerotic heart disease of native coronary artery without angina pectoris: Secondary | ICD-10-CM | POA: Insufficient documentation

## 2024-01-17 DIAGNOSIS — Z87898 Personal history of other specified conditions: Secondary | ICD-10-CM

## 2024-01-17 DIAGNOSIS — I952 Hypotension due to drugs: Secondary | ICD-10-CM

## 2024-01-17 DIAGNOSIS — G8929 Other chronic pain: Secondary | ICD-10-CM | POA: Diagnosis present

## 2024-01-17 DIAGNOSIS — I509 Heart failure, unspecified: Secondary | ICD-10-CM | POA: Insufficient documentation

## 2024-01-17 DIAGNOSIS — E785 Hyperlipidemia, unspecified: Secondary | ICD-10-CM | POA: Diagnosis not present

## 2024-01-17 DIAGNOSIS — Z8673 Personal history of transient ischemic attack (TIA), and cerebral infarction without residual deficits: Secondary | ICD-10-CM

## 2024-01-17 DIAGNOSIS — I1310 Hypertensive heart and chronic kidney disease without heart failure, with stage 1 through stage 4 chronic kidney disease, or unspecified chronic kidney disease: Secondary | ICD-10-CM | POA: Insufficient documentation

## 2024-01-17 DIAGNOSIS — E119 Type 2 diabetes mellitus without complications: Secondary | ICD-10-CM | POA: Insufficient documentation

## 2024-01-17 LAB — TROPONIN I (HIGH SENSITIVITY)
Troponin I (High Sensitivity): 27 ng/L — ABNORMAL HIGH (ref <18)
Troponin I (High Sensitivity): 37 ng/L — ABNORMAL HIGH (ref <18)
Troponin I (High Sensitivity): 21 ng/L — ABNORMAL HIGH (ref <18)
Troponin I (High Sensitivity): 35 ng/L — ABNORMAL HIGH (ref <18)
Troponin I (High Sensitivity): 29 ng/L — ABNORMAL HIGH (ref <18)

## 2024-01-17 LAB — URINE DRUG SCREEN, QUALITATIVE (ARMC ONLY)
Amphetamines, Ur Screen: NOT DETECTED
Barbiturates, Ur Screen: NOT DETECTED
Benzodiazepine, Ur Scrn: NOT DETECTED
Cannabinoid 50 Ng, Ur ~~LOC~~: NOT DETECTED
Cocaine Metabolite,Ur ~~LOC~~: NOT DETECTED
MDMA (Ecstasy)Ur Screen: NOT DETECTED
Methadone Scn, Ur: NOT DETECTED
Opiate, Ur Screen: NOT DETECTED
Phencyclidine (PCP) Ur S: NOT DETECTED
Tricyclic, Ur Screen: NOT DETECTED

## 2024-01-17 LAB — BASIC METABOLIC PANEL WITH GFR
Anion gap: 9 (ref 5–15)
Anion gap: 17 — ABNORMAL HIGH (ref 5–15)
BUN: 17 mg/dL (ref 8–23)
BUN: 30 mg/dL — ABNORMAL HIGH (ref 8–23)
CO2: 24 mmol/L (ref 22–32)
CO2: 21 mmol/L — ABNORMAL LOW (ref 22–32)
Calcium: 8.9 mg/dL (ref 8.9–10.3)
Calcium: 9.6 mg/dL (ref 8.9–10.3)
Chloride: 105 mmol/L (ref 98–111)
Chloride: 97 mmol/L — ABNORMAL LOW (ref 98–111)
Creatinine, Ser: 1.25 mg/dL — ABNORMAL HIGH (ref 0.61–1.24)
Creatinine, Ser: 2.24 mg/dL — ABNORMAL HIGH (ref 0.61–1.24)
GFR, Estimated: >60 mL/min (ref >60)
GFR, Estimated: 31 mL/min — ABNORMAL LOW (ref >60)
Glucose, Bld: 91 mg/dL (ref 70–99)
Glucose, Bld: 182 mg/dL — ABNORMAL HIGH (ref 70–99)
Potassium: 4 mmol/L (ref 3.5–5.1)
Potassium: 3.7 mmol/L (ref 3.5–5.1)
Sodium: 138 mmol/L (ref 135–145)
Sodium: 135 mmol/L (ref 135–145)

## 2024-01-17 LAB — CBC
HCT: 41.8 % (ref 39.0–52.0)
HCT: 44 % (ref 39.0–52.0)
Hemoglobin: 13.3 g/dL (ref 13.0–17.0)
Hemoglobin: 14 g/dL (ref 13.0–17.0)
MCH: 28 pg (ref 26.0–34.0)
MCH: 28 pg (ref 26.0–34.0)
MCHC: 31.8 g/dL (ref 30.0–36.0)
MCHC: 31.8 g/dL (ref 30.0–36.0)
MCV: 88 fL (ref 80.0–100.0)
MCV: 88 fL (ref 80.0–100.0)
Platelets: 150 K/uL (ref 150–400)
Platelets: 150 K/uL (ref 150–400)
RBC: 4.75 MIL/uL (ref 4.22–5.81)
RBC: 5 MIL/uL (ref 4.22–5.81)
RDW: 15.9 % — ABNORMAL HIGH (ref 11.5–15.5)
RDW: 16 % — ABNORMAL HIGH (ref 11.5–15.5)
WBC: 5.4 K/uL (ref 4.0–10.5)
WBC: 5.9 K/uL (ref 4.0–10.5)
nRBC: 0 % (ref 0.0–0.2)
nRBC: 0 % (ref 0.0–0.2)

## 2024-01-17 LAB — LIPID PANEL
Cholesterol: 129 mg/dL (ref 0–200)
HDL: 37 mg/dL — ABNORMAL LOW (ref >40)
LDL Cholesterol: 79 mg/dL (ref 0–99)
Total CHOL/HDL Ratio: 3.5 ratio
Triglycerides: 65 mg/dL (ref <150)
VLDL: 13 mg/dL (ref 0–40)

## 2024-01-17 LAB — CBG MONITORING, ED
Glucose-Capillary: 94 mg/dL (ref 70–99)
Glucose-Capillary: 77 mg/dL (ref 70–99)
Glucose-Capillary: 174 mg/dL — ABNORMAL HIGH (ref 70–99)

## 2024-01-17 LAB — CUP PACEART REMOTE DEVICE CHECK
Date Time Interrogation Session: 20250813230545
Implantable Pulse Generator Implant Date: 20210503

## 2024-01-17 LAB — PROTIME-INR
INR: 1.3 — ABNORMAL HIGH (ref 0.8–1.2)
Prothrombin Time: 17.1 s — ABNORMAL HIGH (ref 11.4–15.2)

## 2024-01-17 LAB — HEPARIN LEVEL (UNFRACTIONATED): Heparin Unfractionated: >1.1 [IU]/mL — ABNORMAL HIGH (ref 0.30–0.70)

## 2024-01-17 LAB — APTT
aPTT: 76 s — ABNORMAL HIGH (ref 24–36)
aPTT: 57 s — ABNORMAL HIGH (ref 24–36)

## 2024-01-17 MED ORDER — HEPARIN BOLUS VIA INFUSION
950.0000 [IU] | Freq: Once | INTRAVENOUS | Status: AC
  Administered 2024-01-17: 950 [IU] via INTRAVENOUS
  Filled 2024-01-17: qty 950

## 2024-01-17 MED ORDER — APIXABAN 5 MG PO TABS
5.0000 mg | ORAL_TABLET | Freq: Two times a day (BID) | ORAL | Status: DC
  Administered 2024-01-17: 5 mg via ORAL
  Filled 2024-01-17: qty 1

## 2024-01-17 MED ORDER — ISOSORBIDE MONONITRATE ER 30 MG PO TB24: 30.0000 mg | ORAL_TABLET | Freq: Every day | ORAL | 1 refills | Status: DC

## 2024-01-17 MED ORDER — ASPIRIN 81 MG PO TBEC: 81.0000 mg | DELAYED_RELEASE_TABLET | Freq: Every day | ORAL | Status: DC

## 2024-01-17 MED ORDER — SODIUM CHLORIDE 0.9 % IV BOLUS
1000.0000 mL | Freq: Once | INTRAVENOUS | Status: AC
  Administered 2024-01-17: 1000 mL via INTRAVENOUS

## 2024-01-17 MED ORDER — EMPAGLIFLOZIN 25 MG PO TABS
25.0000 mg | ORAL_TABLET | Freq: Every day | ORAL | Status: DC
  Administered 2024-01-17: 25 mg via ORAL
  Filled 2024-01-17: qty 1

## 2024-01-17 MED ORDER — ASPIRIN 81 MG PO TBEC
81.0000 mg | DELAYED_RELEASE_TABLET | Freq: Every day | ORAL | Status: DC
  Administered 2024-01-17: 81 mg via ORAL
  Filled 2024-01-17: qty 1

## 2024-01-17 MED ORDER — SPIRONOLACTONE 25 MG PO TABS
25.0000 mg | ORAL_TABLET | Freq: Every day | ORAL | Status: DC
  Administered 2024-01-17: 25 mg via ORAL
  Filled 2024-01-17: qty 1

## 2024-01-17 MED ORDER — FUROSEMIDE 10 MG/ML IJ SOLN
40.0000 mg | Freq: Once | INTRAMUSCULAR | Status: AC
  Administered 2024-01-17: 40 mg via INTRAVENOUS
  Filled 2024-01-17: qty 4

## 2024-01-17 MED ORDER — LACTATED RINGERS IV BOLUS
500.0000 mL | Freq: Once | INTRAVENOUS | Status: AC
  Administered 2024-01-17: 500 mL via INTRAVENOUS

## 2024-01-17 MED ORDER — IOHEXOL 350 MG/ML SOLN
75.0000 mL | Freq: Once | INTRAVENOUS | Status: AC | PRN
  Administered 2024-01-17: 75 mL via INTRAVENOUS

## 2024-01-17 MED ORDER — ISOSORBIDE MONONITRATE ER 60 MG PO TB24
30.0000 mg | ORAL_TABLET | Freq: Every day | ORAL | Status: DC
  Administered 2024-01-17: 30 mg via ORAL
  Filled 2024-01-17: qty 1

## 2024-01-17 NOTE — Assessment & Plan Note (Addendum)
 CAD with history of CABG LHC 07/2022:(100% stenosis Prox RCA /99% stenosis Ost Cx to Prox Cx/95% stenosis Mid LAD/100% Origin to Prox Graft ) Patient had chest pain and diaphoresis during syncopal events Troponin 35-->29 EKG non acute Continue medical management with Plavix , atorvastatin .  Will continue heparin  overnight Reconsult cardiology Will keep n.p.o. in case of procedure

## 2024-01-17 NOTE — Consult Note (Signed)
 [facility] CLINIC CARDIOLOGY CONSULT NOTE       Patient ID: Thomas Moore MRN: [id number] DOB/AGE: [birth year] 68 y.o.  Admit date: 01/16/2024 Referring Physician Dr. Hilma Primary Physician Lenon Layman ORN, MD Primary Cardiologist Dr. Deretha Reason for Consultation chest pain, elevated trops  HPI: Thomas Moore is a 68 y.o. male  with a past medical history of coronary artery disease s/p CABG (1997), chronic HFrEF (30-35% - 08/2023), paroxysmal atrial fibrillation (on Eliquis ), hypertension, hyperlipidemia, diabetes, history stroke, history of SVT, CKD stage IIIa, aortic stenosis s/p TAVR, history of thrombocytopenia, bilateral carotid artery stenosis, loop recorder who presented to the ED on 01/16/2024 for left sided arm pain. Patient denies chest pain. Troponin found to minimally elevated.  Cardiology was consulted for further evaluation.   Patient presented to the ED with left-sided arm pain.  Patient is a poor historian.  Patient states he lives alone and performs all ADLs with no help without any concern.  Patient denies any chest pain, SOB, palpitations or lightheadedness. Work up in the ED notable for sodium 139, potassium 3.7, creatinine 1.44, hemoglobin 12.8, platelets 115.  Lipase elevated at 80.  BNP elevated at 790. CXR without acute cardiopulmonary disease. Troponins mildly elevated flat 17 > 22 > 27 > 37 > 21. EKG without acute ischemic changes, sinus rhythm with first-degree AV block.  Patient was started on IV heparin  infusion and given 324 mg of aspirin .  At the time of my evaluation this AM, patient was resting comfortably in ED stretcher.  We discussed patient's symptoms in further detail.  Patient states he came to the ED due to left arm pain and not chest pain.  Patient states his left arm pain started yesterday morning however it is now resolved.  Patient denies any chest pain, palpitations, lightheadedness or SOB.  Patient states he lives alone and is able to perform all ADLs  without any concern.  Overnight on telemetry patient had some bradycardia with hypotension.  Discussed this with patient and patient states he has felt fine and has remained asymptomatic. Patient only had short episodes of bradycardia in 40s, otherwise HR have remained stable.  Currently patient's heart rates remaining stable in the 70s.  Patient states this morning he feels overall well.  Patient appears overall near euvolemia.  Pertinent Cardiac History (Most recent) LHC (09/2023)   Ost RCA to Prox RCA lesion is 100% stenosed.   Prox Cx lesion is 90% stenosed.   1st Diag lesion is 80% stenosed.   Mid LAD lesion is 80% stenosed.   1st Mrg lesion is 80% stenosed.   Origin lesion is 100% stenosed.   Recommend dual antiplatelet therapy.   LIMA to the LAD was patent, sequential SVG graft to left circumflex branches OM1 and OM 2 are patent.  SVG to PDA is occluded.  Native RCA is completely occluded with bridging collaterals.  Diagonal to the LAD has 80% lesion but is tortuous and not amenable to PCI.  LV gram was deferred due to patient having prior aortic valve replacement.  Patient will be treated medically as diagonal is unprotected but has complex lesion not amenable to PCI.  Loop Recorder (check up 12/16/2023) Loop recorder was revealed by Dr. Cindie at this time and noted no arrhythmias, normal histogram.   Review of systems complete and found to be negative unless listed above    Past Medical History:  Diagnosis Date   CAD (coronary artery disease) of artery bypass graft    s/p CABG  x 4 in 1997   CHF (congestive heart failure) (HCC)    COPD (chronic obstructive pulmonary disease) (HCC)    Coronary artery disease    CVA (cerebral vascular accident) (HCC)    Diabetes mellitus without complication (HCC)    Dysrhythmia    GERD (gastroesophageal reflux disease)    HLD (hyperlipidemia)    Hypertension    S/P TAVR (transcatheter aortic valve replacement) 07/05/2021   with Edwards  26mm S3UR via TF approach with Dr. Wonda and Dr. Lucas   Seizures [facility])     Past Surgical History:  Procedure Laterality Date   BYPASS GRAFT ANGIOGRAPHY N/A 04/25/2021   Procedure: BYPASS GRAFT ANGIOGRAPHY;  Surgeon: Lawyer Bernardino Cough, MD;  Location: [facility] INVASIVE CV LAB;  Service: Cardiovascular;  Laterality: N/A;   CARDIAC SURGERY     CORNEAL TRANSPLANT Right    CORONARY ARTERY BYPASS GRAFT  1997   ELECTROPHYSIOLOGY STUDY N/A 09/08/2021   Procedure: ELECTROPHYSIOLOGY STUDY;  Surgeon: Cindie Ole DASEN, MD;  Location: [facility] INVASIVE CV LAB;  Service: Cardiovascular;  Laterality: N/A;   EYE SURGERY     FLEXOR TENDON REPAIR Left 07/11/2019   Procedure: FLEXOR tenolysis  REPAIR LEFT RING FINGER with tednon repair;  Surgeon: Kathlynn Sharper, MD;  Location: [facility] ORS;  Service: Orthopedics;  Laterality: Left;   INCISION AND DRAINAGE ABSCESS Left 05/08/2019   Procedure: INCISION AND DRAINAGE ABSCESS;  Surgeon: Mardee Lynwood SQUIBB, MD;  Location: [facility] ORS;  Service: Orthopedics;  Laterality: Left;   INTRAOPERATIVE TRANSTHORACIC ECHOCARDIOGRAM N/A 07/05/2021   Procedure: INTRAOPERATIVE TRANSTHORACIC ECHOCARDIOGRAM;  Surgeon: Wonda Sharper, MD;  Location: [facility] OR;  Service: Open Heart Surgery;  Laterality: N/A;   LEFT HEART CATH AND CORONARY ANGIOGRAPHY Left 06/09/2022   Procedure: LEFT HEART CATH AND CORONARY ANGIOGRAPHY;  Surgeon: Fernand Denyse LABOR, MD;  Location: [facility] INVASIVE CV LAB;  Service: Cardiovascular;  Laterality: Left;   LEFT HEART CATH AND CORONARY ANGIOGRAPHY Left 09/27/2023   Procedure: LEFT HEART CATH AND CORONARY ANGIOGRAPHY with possible coronary intervention;  Surgeon: Fernand Denyse LABOR, MD;  Location: [facility] INVASIVE CV LAB;  Service: Cardiovascular;  Laterality: Left;   LEFT HEART CATH AND CORS/GRAFTS ANGIOGRAPHY N/A 10/02/2019   Procedure: LEFT HEART CATH AND CORS/GRAFTS ANGIOGRAPHY;  Surgeon: Anner Alm ORN, MD;  Location: [facility] INVASIVE CV LAB;  Service: Cardiovascular;  Laterality: N/A;    LEFT HEART CATH AND CORS/GRAFTS ANGIOGRAPHY N/A 07/17/2022   Procedure: LEFT HEART CATH AND CORS/GRAFTS ANGIOGRAPHY and possible PCI and stent;  Surgeon: Fernand Denyse LABOR, MD;  Location: [facility] INVASIVE CV LAB;  Service: Cardiovascular;  Laterality: N/A;   LOOP RECORDER INSERTION N/A 10/06/2019   Procedure: LOOP RECORDER INSERTION;  Surgeon: Waddell Danelle ORN, MD;  Location: [facility] INVASIVE CV LAB;  Service: Cardiovascular;  Laterality: N/A;   TRANSCATHETER AORTIC VALVE REPLACEMENT, TRANSFEMORAL N/A 07/05/2021   Procedure: TRANSCATHETER AORTIC VALVE REPLACEMENT, TRANSFEMORAL;  Surgeon: Wonda Sharper, MD;  Location: [facility] OR;  Service: Open Heart Surgery;  Laterality: N/A;   TRIGGER FINGER RELEASE Left     (Not in a hospital admission)    Family History  Problem Relation Age of Onset   Seizures Brother      Vitals:   01/17/24 0530 01/17/24 0600 01/17/24 0630 01/17/24 0700  BP: 120/61 109/60 108/62 97/72  Pulse: 62 61 65 73  Resp: 13 18 19 20   Temp:      TempSrc:      SpO2: 100% 100% 100% 100%  Weight:      Height:  PHYSICAL EXAM General: Chronically ill-appearing elderly male, well nourished, in no acute distress. HEENT: Normocephalic and atraumatic. Neck: No JVD.   Lungs: Normal respiratory effort on room air. Clear bilaterally to auscultation. No wheezes, crackles, rhonchi.  Heart: HRRR. Normal S1 and S2 without gallops or murmurs.  Abdomen: Non-distended appearing.  Msk: Normal strength and tone for age. Extremities: Warm and well perfused. No clubbing, cyanosis, edema.  Neuro: Alert and oriented X 3. Psych: Answers questions appropriately.   Labs: Basic Metabolic Panel: Recent Labs    01/16/24 0943 01/17/24 0310  NA 139 138  K 3.7 4.0  CL 103 105  CO2 25 24  GLUCOSE 170* 91  BUN 18 17  CREATININE 1.44* 1.25*  CALCIUM  9.0 8.9   Liver Function Tests: Recent Labs    01/16/24 0943  AST 24  ALT 20  ALKPHOS 112  BILITOT 0.3  PROT 8.1  ALBUMIN 3.5   Recent Labs     01/16/24 0943  LIPASE 80*   CBC: Recent Labs    01/16/24 0943 01/17/24 0310  WBC 4.1 5.4  HGB 12.8* 13.3  HCT 40.1 41.8  MCV 88.1 88.0  PLT 115* 150   Cardiac Enzymes: Recent Labs    01/16/24 1507 01/16/24 2329 01/17/24 0310  TROPONINIHS 22* 27* 37*   BNP: Recent Labs    01/16/24 0943  BNP 798.4*   D-Dimer: No results for input(s): DDIMER in the last 72 hours. Hemoglobin A1C: No results for input(s): HGBA1C in the last 72 hours. Fasting Lipid Panel: Recent Labs    01/17/24 0310  CHOL 129  HDL 37*  LDLCALC 79  TRIG 65  CHOLHDL 3.5   Thyroid  Function Tests: No results for input(s): TSH, T4TOTAL, T3FREE, THYROIDAB in the last 72 hours.  Invalid input(s): FREET3 Anemia Panel: No results for input(s): VITAMINB12, FOLATE, FERRITIN, TIBC, IRON, RETICCTPCT in the last 72 hours.   Radiology: DG Chest 2 View Result Date: 01/16/2024 CLINICAL DATA:  Chest pain. EXAM: CHEST - 2 VIEW COMPARISON:  09/17/2023. FINDINGS: Bilateral lungs appear hyperexpanded and hyperlucent with coarse bronchovascular markings, in keeping with COPD. Bilateral lungs otherwise appear clear. No dense consolidation or lung collapse. Bilateral costophrenic angles are clear. Stable cardio-mediastinal silhouette. There are surgical staples along the heart border and sternotomy wires, status post CABG (coronary artery bypass graft). There is prosthetic aortic valve. Stable appearance of left lower anterior chest wall loop recorder device. No acute osseous abnormalities. The soft tissues are within normal limits. IMPRESSION: No active cardiopulmonary disease. COPD. Electronically Signed   By: Ree Molt M.D.   On: 01/16/2024 10:03    ECHO 08/06/2023 1. Left ventricular ejection fraction, by estimation, is 30 to 35%. The left ventricle has moderately decreased function. The left ventricle demonstrates regional wall motion abnormalities (see scoring diagram/findings for  description). There is mild eccentric left ventricular hypertrophy of the septal segment. Left ventricular diastolic parameters are consistent with Grade III diastolic dysfunction (restrictive).   2. Right ventricular systolic function is mildly reduced. The right ventricular size is mildly enlarged.   3. Left atrial size was mildly dilated.   4. Right atrial size was mildly dilated.   5. The mitral valve is grossly normal. Mild mitral valve regurgitation.   6. The aortic valve is normal in structure. Aortic valve regurgitation is not visualized.   TELEMETRY reviewed by me 01/17/2024: Sinus rhythm, rate 70s  EKG reviewed by me: sinus rhythm with first-degree AV block.    Data reviewed by me 01/17/2024:  last 24h vitals tele labs imaging I/O ED provider note, admission H&P.  Principal Problem:   Chest pain Active Problems:   Essential hypertension   History of stroke   History of seizure   Hx of pulmonary embolus   HLD (hyperlipidemia)   Paroxysmal atrial fibrillation (HCC)   Chronic kidney disease, stage 3a (HCC)   Type II diabetes mellitus with renal manifestations (HCC)   Thrombocytopenia (HCC)   CAD (coronary artery disease)   Elevated lipase   Chronic combined systolic and diastolic CHF (congestive heart failure) (HCC)    ASSESSMENT AND PLAN:  Thomas Moore is a 68 y.o. male  with a past medical history of coronary artery disease s/p CABG (1997), chronic HFrEF (30-35% - 08/2023), paroxysmal atrial fibrillation (on Eliquis ), hypertension, hyperlipidemia, diabetes, history stroke, history of SVT, CKD stage IIIa, aortic stenosis s/p TAVR, history of thrombocytopenia, bilateral carotid artery stenosis, loop recorder who presented to the ED on 01/16/2024 for left sided arm pain. Patient denies chest pain. Troponin found to minimally elevated.  Cardiology was consulted for further evaluation.   # Coronary artery disease s/p CABG # Hypertension # Hyperlipidemia Patient without chest  pain. Troponins mildly elevated and flat 17 > 22 > 27 > 37 > 21. EKG without acute ischemic changes.  Recent LHC with Dr. Fernand on 09/2022 that revealed diagonal to LAD that was not amendable to PCI, treated medically. -Continue Plavix  75 mg daily. -Continue atorvastatin  80 mg daily. -Ordered Imdur  30 mg for antianginal optimization. -Metoprolol  previous discontinued on 08/2023 likely due to bradycardia. Overnight telemetry revealed few episodes of aSX bradycardia at 30-40s, for now will hold off. Otherwise HR have been stable. -Entresto  as stated below.  # Chronic HFrEF Appears euvolemic. -Continue home Lasix  20 mg daily. -Continue home Entresto  24-26 mg twice daily. -Resume home Jardiance . -Ordered spironolactone  25 mg daily for GDMT optimization.  # Paroxysmal atrial fibrillation -Continue home Eliquis  5 mg twice daily for stroke risk reduction. -Continue home amiodarone .  Ok for discharge today from a cardiac perspective after patient ambulates with no concerns. Arrange for follow up in clinic with Dr. Deretha in 1-2 weeks. Will sign off.    This patient's plan of care was discussed and created with Dr. Ammon and he is in agreement.  Signed: Merl Bommarito, PA-C  01/17/2024, 7:14 AM [facility] Cardiology

## 2024-01-17 NOTE — ED Notes (Signed)
 Green top collected and sent to lab, pt provided w/ warm blanket.

## 2024-01-17 NOTE — Care Management Obs Status (Signed)
 MEDICARE OBSERVATION STATUS NOTIFICATION   Patient Details  Name: Thomas Moore MRN: [id number] Date of Birth: 01-21-1956   Medicare Observation Status Notification Given:  Chaney BRANDY CHRISTIANE LELON, CMA 01/17/2024, 2:15 PM

## 2024-01-17 NOTE — ED Notes (Signed)
 CCMD called to continue cardiac monitoring.

## 2024-01-17 NOTE — Consult Note (Signed)
 PHARMACY - ANTICOAGULATION CONSULT NOTE  Pharmacy Consult for Heparin   Indication: hx of PE and A fib, now has possible NSTEMI  No Known Allergies  Patient Measurements: Height: 5' 3 (160 cm) Weight: 60.9 kg (134 lb 4.2 oz) IBW/kg (Calculated) : 56.9 HEPARIN  DW (KG): 60.9  Vital Signs: Temp: 98.5 F (36.9 C) (08/14 0739) Temp Source: Oral (08/14 0739) BP: 122/63 (08/14 0830) Pulse Rate: 71 (08/14 0830)  Labs: Recent Labs    01/16/24 0943 01/16/24 1507 01/16/24 2329 01/17/24 0310 01/17/24 0918  HGB 12.8*  --   --  13.3  --   HCT 40.1  --   --  41.8  --   PLT 115*  --   --  150  --   APTT  --   --  109* 76* 57*  LABPROT  --   --  16.6*  --   --   INR  --   --  1.3*  --   --   HEPARINUNFRC  --   --  >1.10*  --  >1.10*  CREATININE 1.44*  --   --  1.25*  --   TROPONINIHS 17 22* 27* 37*  --     Estimated Creatinine Clearance: 45.5 mL/min (A) (by C-G formula based on SCr of 1.25 mg/dL (H)).   Medical History: Past Medical History:  Diagnosis Date   CAD (coronary artery disease) of artery bypass graft    s/p CABG x 4 in 1997   CHF (congestive heart failure) (HCC)    COPD (chronic obstructive pulmonary disease) (HCC)    Coronary artery disease    CVA (cerebral vascular accident) (HCC)    Diabetes mellitus without complication (HCC)    Dysrhythmia    GERD (gastroesophageal reflux disease)    HLD (hyperlipidemia)    Hypertension    S/P TAVR (transcatheter aortic valve replacement) 07/05/2021   with Edwards 26mm S3UR via TF approach with Dr. Wonda and Dr. Lucas   Seizures [facility])     Medications:  (Not in a hospital admission)  Scheduled:   amiodarone   200 mg Oral BID   aspirin  EC  81 mg Oral Daily   atorvastatin   80 mg Oral QHS   calcium -vitamin D   1 tablet Oral BID   clopidogrel   75 mg Oral Daily   divalproex   250 mg Oral QHS   fluticasone  furoate-vilanterol  1 puff Inhalation Daily   furosemide   20 mg Oral Daily   insulin  aspart  0-5 Units Subcutaneous  QHS   insulin  aspart  0-9 Units Subcutaneous TID WC   multivitamin with minerals  1 tablet Oral Daily   sacubitril -valsartan   1 tablet Oral BID   Infusions:   heparin  700 Units/hr (01/17/24 0702)   PRN: acetaminophen , albuterol , dextromethorphan -guaiFENesin , hydrALAZINE , midazolam  PF, morphine  injection, nitroGLYCERIN , ondansetron  (ZOFRAN ) IV, oxyCODONE -acetaminophen  Anti-infectives (From admission, onward)    None       Assessment: 68 y.o. male past medical history significant for afib, CHF, CAD with prior CABG and PCI, diabetes, hypertension, who presents to the emergency department chest pain. On apixaban  PTA. Last dose unknown. Will order baseline heparin  level. Will monitor heparin  with aPTT. Hgb and plt stable. Trop slightly elevated at 22. EKG showed normal sinus rhythm. Nonspecific ST changes but no significant change when compared to prior EKG on April 2025.   Goal of Therapy:  Heparin  level 0.3-0.7 units/ml once aPTT and heparin  level correlate.  aPTT 66-102 seconds Monitor platelets by anticoagulation protocol: Yes   Plan:  8/14: aPTT@0918 =57, HL>1.1 - aPTT subtherapeutic - use aPTT to guide dosing until correlating with HL  - will order 950 unit bolus x 1 and increase heparin  rate to 850 units/hr - recheck aPTT 6 hrs after rate change and HL daily - continue to monitor H&H and platelets  Idolina DELENA Percy, PharmD Clinical Pharmacist 01/17/2024 9:44 AM

## 2024-01-17 NOTE — ED Provider Notes (Signed)
-----------------------------------------   11:48 PM on 01/17/2024 -----------------------------------------  Assuming care from Dr. Jossie.  In short, Thomas Moore is a 68 y.o. male with a chief complaint of syncope.  Refer to the original H&P for additional details.  The current plan of care is to follow up CTA and call back to hospitalist for admission.   Clinical Course as of 01/18/24 0154  Fri Jan 18, 2024  0153 CT Angio Chest PE W/Cm &/Or Wo Cm I independently viewed and interpreted the patient's CTA chest and see no evidence of pulmonary embolism.  There were some groundglass changes in the lung that the radiologist identified as infectious or inflammatory.  The question regarding the hospitalization was the question of a saddle embolus and I consulted with Dr. Cleatus with the hospitalist service who saw the CT results and will admit the patient. [CF]    Clinical Course User Index [CF] Gordan Huxley, MD     Medications  heparin  ADULT infusion 100 units/mL (25000 units/250mL) (850 Units/hr Intravenous New Bag/Given 01/18/24 0120)  atorvastatin  (LIPITOR ) tablet 80 mg (has no administration in time range)  insulin  glargine-yfgn (SEMGLEE ) injection 10 Units (has no administration in time range)  empagliflozin  (JARDIANCE ) tablet 25 mg (has no administration in time range)  divalproex  (DEPAKOTE  ER) 24 hr tablet 250 mg (has no administration in time range)  aspirin  EC tablet 81 mg (has no administration in time range)  nitroGLYCERIN  (NITROSTAT ) SL tablet 0.4 mg (has no administration in time range)  acetaminophen  (TYLENOL ) tablet 650 mg (has no administration in time range)  magnesium  oxide (MAG-OX) tablet 800 mg (has no administration in time range)  0.9 %  sodium chloride  infusion (has no administration in time range)  sodium chloride  0.9 % bolus 1,000 mL (0 mLs Intravenous Stopped 01/18/24 0126)  iohexol  (OMNIPAQUE ) 350 MG/ML injection 75 mL (75 mLs Intravenous Contrast Given 01/17/24  2339)  potassium chloride  SA (KLOR-CON  M) CR tablet 40 mEq (40 mEq Oral Given 01/18/24 0055)     ED Discharge Orders     None      Final diagnoses:  Syncope and collapse  AKI (acute kidney injury) (HCC)     Gordan Huxley, MD 01/18/24 9845

## 2024-01-17 NOTE — ED Notes (Addendum)
 CCMD contacted this nurse regarding pt being bradycardic with HR dropping down to 39. Upon arrival to pt's room, this nurse noticed that pt was also hypotensive with BP reading 62/44 and pt diaphoretic. Pt denied chest pain or shortness of breath. EKG obtained and given to ER provider. Charge nurse notified. Pt moved to room 4 for closer evaluation. On call provider paged. Report given to Dorian, RN at bedside.

## 2024-01-17 NOTE — ED Provider Notes (Signed)
 [facility] Provider Note   Event Date/Time   First MD Initiated Contact with Patient 01/17/24 2048     (approximate) History  Near Syncope  HPI Thomas Moore is a 68 y.o. male with a past ministry of CAD status post CABG, GERD, COPD, epilepsy, CHF, hyperlipidemia, and type 2 diabetes who presents complaining of of weakness, dizziness, and diaphoresis after taking a nitroglycerin  after being discharged earlier today.  Patient was sitting outside waiting for a cab when a bystander noticed him lose consciousness for approximately 10 seconds.  Patient then stated that he was feeling extremely nauseous and that he needed to throw up.  Patient denies any nausea or chest pain at this time ROS: Patient currently denies any vision changes, tinnitus, difficulty speaking, facial droop, sore throat, shortness of breath, abdominal pain, diarrhea, dysuria, or weakness/numbness/paresthesias in any extremity   Physical Exam  Triage Vital Signs: ED Triage Vitals  Encounter Vitals Group     BP 01/17/24 2026 94/73     Girls Systolic BP Percentile --      Girls Diastolic BP Percentile --      Boys Systolic BP Percentile --      Boys Diastolic BP Percentile --      Pulse Rate 01/17/24 2026 91     Resp 01/17/24 2026 20     Temp 01/17/24 2040 (!) 96.6 F (35.9 C)     Temp Source 01/17/24 2040 Axillary     SpO2 01/17/24 2026 96 %     Weight --      Height --      Head Circumference --      Peak Flow --      Pain Score --      Pain Loc --      Pain Education --      Exclude from Growth Chart --    Most recent vital signs: Vitals:   01/17/24 2100 01/17/24 2230  BP: (!) 104/57 (!) 100/53  Pulse: 76 67  Resp: 20 17  Temp:    SpO2: 98% 99%   General: Awake, oriented x4. CV:  Good peripheral perfusion. Resp:  Normal effort. Abd:  No distention. Other:  Elderly well-developed, well-nourished Caucasian male resting comfortably in no acute distress ED Results / Procedures  / Treatments  Labs (all labs ordered are listed, but only abnormal results are displayed) Labs Reviewed  BASIC METABOLIC PANEL WITH GFR - Abnormal; Notable for the following components:      Result Value   Chloride 97 (*)    CO2 21 (*)    Glucose, Bld 182 (*)    BUN 30 (*)    Creatinine, Ser 2.24 (*)    GFR, Estimated 31 (*)    Anion gap 17 (*)    All other components within normal limits  CBC - Abnormal; Notable for the following components:   RDW 16.0 (*)    All other components within normal limits  PROTIME-INR - Abnormal; Notable for the following components:   Prothrombin Time 17.1 (*)    INR 1.3 (*)    All other components within normal limits  CBG MONITORING, ED - Abnormal; Notable for the following components:   Glucose-Capillary 174 (*)    All other components within normal limits  TROPONIN I (HIGH SENSITIVITY) - Abnormal; Notable for the following components:   Troponin I (High Sensitivity) 35 (*)    All other components within normal limits  TROPONIN I (HIGH SENSITIVITY) -  Abnormal; Notable for the following components:   Troponin I (High Sensitivity) 29 (*)    All other components within normal limits   EKG ED ECG REPORT I, Artist MARLA Kerns, the attending physician, personally viewed and interpreted this ECG. Date: 01/17/2024 EKG Time: 2035 Rate: 87 Rhythm: normal sinus rhythm QRS Axis: normal Intervals: normal ST/T Wave abnormalities: normal Narrative Interpretation: no evidence of acute ischemia RADIOLOGY ED MD interpretation:   - All radiology independently interpreted and agree with radiology assessment Official radiology report(s): CT ABDOMEN PELVIS WO CONTRAST Result Date: 01/17/2024 CLINICAL DATA:  LUQ abdominal pain EXAM: CT ABDOMEN AND PELVIS WITHOUT CONTRAST TECHNIQUE: Multidetector CT imaging of the abdomen and pelvis was performed following the standard protocol without IV contrast. RADIATION DOSE REDUCTION: This exam was performed according to the  departmental dose-optimization program which includes automated exposure control, adjustment of the mA and/or kV according to patient size and/or use of iterative reconstruction technique. COMPARISON:  February 12, 2023 FINDINGS: Of note, the lack of intravenous contrast limits evaluation of the solid organ parenchyma and vascularity. The study is degraded by motion. Lower chest: Trace left pleural effusion Posterior bibasilar dependent atelectasis. Multi-vessel coronary atherosclerosis. Hepatobiliary: No mass.Fluid-filled and distended without wall thickening or radiopaque stones. No intrahepatic or extrahepatic biliary ductal dilation. Pancreas: No mass or main ductal dilation. No peripancreatic inflammation or fluid collection. Spleen: Normal size. No mass. Adrenals/Urinary Tract: No adrenal masses. No renal mass. Punctate nonobstructive bilateral calyceal calculi. There is a larger interpolar region calculus on the left measuring 4 mm. Circumferential wall thickening of the urinary bladder. Stomach/Bowel: The stomach is decompressed without focal abnormality. No small bowel wall thickening or inflammation. No small bowel obstruction.Normal appendix. Vascular/Lymphatic: No aortic aneurysm. Diffuse aortoiliac atherosclerosis. No intraabdominal or pelvic lymphadenopathy. Reproductive: Mild prostatomegaly.  No free pelvic fluid. Other: No pneumoperitoneum, ascites, or mesenteric inflammation. Musculoskeletal: No acute fracture or destructive lesion. Diffuse osteopenia. Multilevel degenerative disc disease of the spine. IMPRESSION: 1. Circumferential wall thickening of the urinary bladder, which may be due to underdistension or chronic bladder outlet obstruction in the setting of prostatomegaly. If there is concern for acute cystitis, correlation with urinalysis would be recommended. 2. Nonobstructive bilateral nephrolithiasis.  No hydronephrosis. 3. Mild prostatomegaly. 4. Trace left pleural effusion. Aortic  Atherosclerosis (ICD10-I70.0). Electronically Signed   By: Rogelia Myers M.D.   On: 01/17/2024 15:53   PROCEDURES: Critical Care performed: No Procedures MEDICATIONS ORDERED IN ED: Medications  sodium chloride  0.9 % bolus 1,000 mL (1,000 mLs Intravenous New Bag/Given 01/17/24 2205)  iohexol  (OMNIPAQUE ) 350 MG/ML injection 75 mL (75 mLs Intravenous Contrast Given 01/17/24 2339)   IMPRESSION / MDM / ASSESSMENT AND PLAN / ED COURSE  I reviewed the triage vital signs and the nursing notes.                             The patient is on the cardiac monitor to evaluate for evidence of arrhythmia and/or significant heart rate changes. Patient's presentation is most consistent with acute presentation with potential threat to life or bodily function. This patient presents with generalized weakness and fatigue likely secondary to dehydration.  Questionable syncopal episode.  Suspect acute kidney injury of prerenal origin. Doubt intrinsic renal dysfunction or obstructive nephropathy. Considered alternate etiologies of the patient's symptoms including infectious processes, severe metabolic derangements or electrolyte abnormalities, ischemia/ACS, heart failure, and intracranial/central processes but think these are unlikely given the history and physical exam.  Plan:  labs, 1Lfluid resuscitation, pain/nausea control, reassessment  Dispo: Admit to medicine   FINAL CLINICAL IMPRESSION(S) / ED DIAGNOSES   Final diagnoses:  Syncope and collapse  AKI (acute kidney injury) (HCC)   Rx / DC Orders   ED Discharge Orders     None      Note:  This document was prepared using Dragon voice recognition software and may include unintentional dictation errors.   Jossie Artist POUR, MD 01/17/24 [phone]

## 2024-01-17 NOTE — ED Notes (Signed)
 Report received from Jeanna, [place]. Pt changed into gown and repositioned in bed. Bed in lowest position with wheels locked. Side rails up. Call light within reach. No requests at this time.

## 2024-01-17 NOTE — Hospital Course (Addendum)
 Taken from H&P.  Thomas Moore is a 68 y.o. male with medical history significant of A fib and PE on Eliquis , dCHF, CAD, s/p of CABG 1997, HTN, HLD, DM, stroke, CKD-3a, SVT, thrombocytopenia, aortic stenosis (s/p of TAVR), bilateral carotid artery stenosis, seizure, chronic back pain, kidney stone (s/p of ESWL), loop recorder, who presents with chest pain.   Patient was unable to answer questions appropriately.  Admitted tried calling family for any collateral but unable to reach anyone.  Patient was having some decreased appetite but denying any other symptoms.  He follow-up with Dr. Fernand for cardiology.  On chart review patient had a recent cardiac catheterization within the past year that was not amenable to PCI.   Presentation hemodynamically stable, chest x-ray was negative.  Troponin 17>> 22.  EKG with NSR, QTc 424, ST depression in inferior leads, V4-V6.  8/14: Vital stable this morning, overnight intermittent bradycardia and softer blood pressure.  Overall seems stable and denies any more chest pain.  Cardiology added Imdur  and recommending outpatient follow-up with his own cardiologist.  Patient was found to have mildly elevated lipase, no abdominal pain.  CT abdomen was obtained and it was negative for any concern of pancreatitis.  Did show chronic bladder wall thickening and prostamegaly.  There were noted nonobstructive bilateral nephrolithiasis, no hydronephrosis and trace left pleural effusion.  Patient will continue on current medications and need to have a close follow-up with his providers for further management.

## 2024-01-17 NOTE — Assessment & Plan Note (Addendum)
 Medication induced hypotension with history of orthostatic hypotension Recent sinus bradycardia, 30s to 40s on 01/16/2024 (loop recorder interrogation 01/17/2024-no events) QT prolongation on EKG 01/17/2024 BP 94/73 on return to ED Patient started on Imdur  and spironolactone  earlier in the a.m.--suspecting likely culprits ?  Amiodarone  Hold blood pressure lowering medications for now Continuous cardiac monitoring S/p loop recorder interrogation on a.m. of 8/14 and no events seen Hold QT prolonging drugs DC amiodarone  pending clarification on whether patient should be taking it or not.(Prior discontinuation earlier this year) Cardiology reconsulted

## 2024-01-17 NOTE — ED Notes (Signed)
 Bilateral hearing aids placed in specimen cup and placed with pts belongings.

## 2024-01-17 NOTE — ED Notes (Signed)
 Pt reports feeling weak and has heaviness in his chest 4/10.SABRA pt diaphoretic upon arrival to room. IV started, EKG performed, and on cardiac monitor. Visitor at bedside. Pt reports taking a nitroglycerin  while waiting for his taxi.

## 2024-01-17 NOTE — ED Triage Notes (Signed)
 Pt was discharged today from ED while waiting for taxi to take him home became weak, dizzy and extremely diaphoretic. Bystander in the waiting area said pt appeared to have passed up a few times for about 10 seconds. Ptstates   I just don't feel right and I think I need to throw up.

## 2024-01-17 NOTE — Consult Note (Signed)
 PHARMACY - ANTICOAGULATION CONSULT NOTE  Pharmacy Consult for Heparin   Indication: hx of PE and A fib, now has possible NSTEMI  No Known Allergies  Patient Measurements: Height: 5' 3 (160 cm) Weight: 60.9 kg (134 lb 4.2 oz) IBW/kg (Calculated) : 56.9 HEPARIN  DW (KG): 60.9  Vital Signs: Temp: 98.5 F (36.9 C) (08/14 0323) Temp Source: Oral (08/14 0323) BP: 127/66 (08/14 0149) Pulse Rate: 87 (08/14 0149)  Labs: Recent Labs    01/16/24 0943 01/16/24 1507 01/16/24 2329 01/17/24 0310  HGB 12.8*  --   --  13.3  HCT 40.1  --   --  41.8  PLT 115*  --   --  150  APTT  --   --  109* 76*  LABPROT  --   --  16.6*  --   INR  --   --  1.3*  --   HEPARINUNFRC  --   --  >1.10*  --   CREATININE 1.44*  --   --   --   TROPONINIHS 17 22* 27*  --     Estimated Creatinine Clearance: 39.5 mL/min (A) (by C-G formula based on SCr of 1.44 mg/dL (H)).   Medical History: Past Medical History:  Diagnosis Date   CAD (coronary artery disease) of artery bypass graft    s/p CABG x 4 in 1997   CHF (congestive heart failure) (HCC)    COPD (chronic obstructive pulmonary disease) (HCC)    Coronary artery disease    CVA (cerebral vascular accident) (HCC)    Diabetes mellitus without complication (HCC)    Dysrhythmia    GERD (gastroesophageal reflux disease)    HLD (hyperlipidemia)    Hypertension    S/P TAVR (transcatheter aortic valve replacement) 07/05/2021   with Edwards 26mm S3UR via TF approach with Dr. Wonda and Dr. Lucas   Seizures [facility])     Medications:  (Not in a hospital admission)  Scheduled:   amiodarone   200 mg Oral BID   aspirin  EC  81 mg Oral Daily   atorvastatin   80 mg Oral QHS   calcium -vitamin D   1 tablet Oral BID   clopidogrel   75 mg Oral Daily   divalproex   250 mg Oral QHS   fluticasone  furoate-vilanterol  1 puff Inhalation Daily   furosemide   20 mg Oral Daily   insulin  aspart  0-5 Units Subcutaneous QHS   insulin  aspart  0-9 Units Subcutaneous TID WC    multivitamin with minerals  1 tablet Oral Daily   sacubitril -valsartan   1 tablet Oral BID   Infusions:   heparin  700 Units/hr (01/16/24 2237)   PRN: acetaminophen , albuterol , dextromethorphan -guaiFENesin , hydrALAZINE , midazolam  PF, morphine  injection, nitroGLYCERIN , ondansetron  (ZOFRAN ) IV, oxyCODONE -acetaminophen  Anti-infectives (From admission, onward)    None       Assessment: 68 y.o. male past medical history significant for afib, CHF, CAD with prior CABG and PCI, diabetes, hypertension, who presents to the emergency department chest pain. On apixaban  PTA. Last dose unknown. Will order baseline heparin  level. Will monitor heparin  with aPTT. Hgb and plt stable. Trop slightly elevated at 22. EKG showed normal sinus rhythm. Nonspecific ST changes but no significant change when compared to prior EKG on April 2025.   Goal of Therapy:  Heparin  level 0.3-0.7 units/ml once aPTT and heparin  level correlate.  aPTT 66-102 seconds Monitor platelets by anticoagulation protocol: Yes   Plan:  8/14: aPTT @ 0310 = 76, therapeutic X 1 - use aPTT to guide dosing until correlating with HL  -  will continue pt on current rate and recheck HL and aPTT in 6 hrs Continue to monitor H&H and platelets  Karley Pho D, PharmD 01/17/2024,3:50 AM

## 2024-01-17 NOTE — ED Notes (Signed)
 This Rn attempted to draw off line for repeat trop due at 6am. Line did not draw. Attempted to straight stick x1. Not successful. Lab called for straight stick.

## 2024-01-17 NOTE — Discharge Summary (Signed)
 Physician Discharge Summary   Patient: Thomas Moore MRN: [id number] DOB: 10-27-1955  Admit date:     01/16/2024  Discharge date: 01/17/24  Discharge Physician: Amaryllis Dare   PCP: Lenon Layman ORN, MD   Recommendations at discharge:  Please obtain CBC and BMP on follow-up Follow-up with cardiology With primary care provider  Discharge Diagnoses: Principal Problem:   Chest pain Active Problems:   CAD (coronary artery disease)   Chronic combined systolic and diastolic CHF (congestive heart failure) (HCC)   Essential hypertension   HLD (hyperlipidemia)   Type II diabetes mellitus with renal manifestations (HCC)   Elevated lipase   Thrombocytopenia (HCC)   Chronic kidney disease, stage 3a (HCC)   History of stroke   Hx of pulmonary embolus   Paroxysmal atrial fibrillation (HCC)   History of seizure   Hospital Course: Taken from H&P.  Thomas Moore is a 68 y.o. male with medical history significant of A fib and PE on Eliquis , dCHF, CAD, s/p of CABG 1997, HTN, HLD, DM, stroke, CKD-3a, SVT, thrombocytopenia, aortic stenosis (s/p of TAVR), bilateral carotid artery stenosis, seizure, chronic back pain, kidney stone (s/p of ESWL), loop recorder, who presents with chest pain.   Patient was unable to answer questions appropriately.  Admitted tried calling family for any collateral but unable to reach anyone.  Patient was having some decreased appetite but denying any other symptoms.  He follow-up with Dr. Fernand for cardiology.  On chart review patient had a recent cardiac catheterization within the past year that was not amenable to PCI.   Presentation hemodynamically stable, chest x-ray was negative.  Troponin 17>> 22.  EKG with NSR, QTc 424, ST depression in inferior leads, V4-V6.  8/14: Vital stable this morning, overnight intermittent bradycardia and softer blood pressure.  Overall seems stable and denies any more chest pain.  Cardiology added Imdur  and recommending outpatient  follow-up with his own cardiologist.  Patient was found to have mildly elevated lipase, no abdominal pain.  CT abdomen was obtained and it was negative for any concern of pancreatitis.  Did show chronic bladder wall thickening and prostamegaly.  There were noted nonobstructive bilateral nephrolithiasis, no hydronephrosis and trace left pleural effusion.  Patient will continue on current medications and need to have a close follow-up with his providers for further management.   Consultants: Cardiology Procedures performed: None Disposition: Home Diet recommendation:  Discharge Diet Orders (From admission, onward)     Start     Ordered   01/17/24 0000  Diet - low sodium heart healthy        01/17/24 1629           Cardiac and Carb modified diet DISCHARGE MEDICATION: Allergies as of 01/17/2024   No Known Allergies      Medication List     STOP taking these medications    Dulaglutide  4.5 MG/0.5ML Soaj   metoprolol  succinate 25 MG 24 hr tablet Commonly known as: TOPROL -XL   moxifloxacin 0.5 % ophthalmic solution Commonly known as: VIGAMOX   trimethoprim-polymyxin b  ophthalmic solution Commonly known as: POLYTRIM       TAKE these medications    acetaminophen  650 MG CR tablet Commonly known as: TYLENOL  Take 1,300 mg by mouth every 8 (eight) hours as needed for pain.   amiodarone  200 MG tablet Commonly known as: Pacerone  Take 1 tablet (200 mg total) by mouth 2 (two) times daily.   apixaban  5 MG Tabs tablet Commonly known as: ELIQUIS  Take 1 tablet (5  mg total) by mouth 2 (two) times daily.   atorvastatin  80 MG tablet Commonly known as: LIPITOR  Take 1 tablet (80 mg total) by mouth at bedtime.   CALCIUM  + D PO Take 1 tablet by mouth 2 (two) times daily.   clopidogrel  75 MG tablet Commonly known as: PLAVIX  Take 1 tablet by mouth once daily   divalproex  250 MG 24 hr tablet Commonly known as: DEPAKOTE  ER Take 1 tablet (250 mg total) by mouth at bedtime.    Entresto  24-26 MG Generic drug: sacubitril -valsartan  Take 1 tablet by mouth 2 (two) times daily.   fluticasone -salmeterol 100-50 MCG/ACT Aepb Commonly known as: ADVAIR Inhale into the lungs.   furosemide  20 MG tablet Commonly known as: Lasix  Take 1 tablet (20 mg total) by mouth daily.   insulin  degludec 100 UNIT/ML FlexTouch Pen Commonly known as: TRESIBA Inject 10 Units into the skin daily.   isosorbide  mononitrate 30 MG 24 hr tablet Commonly known as: IMDUR  Take 1 tablet (30 mg total) by mouth daily. Start taking on: January 18, 2024 What changed:  how much to take how to take this when to take this additional instructions   Jardiance  25 MG Tabs tablet Generic drug: empagliflozin  Take 25 mg by mouth daily.   multivitamin capsule Take 1 capsule by mouth daily.   nitroGLYCERIN  0.4 MG SL tablet Commonly known as: NITROSTAT  Place 1 tablet (0.4 mg total) under the tongue every 5 (five) minutes as needed for chest pain.        Follow-up Information     Lenon Layman ORN, MD. Call today.   Specialty: Internal Medicine Contact information: [address] [facility] [place] [address] [phone]         Fernand Denyse LABOR, MD. Call in 1 day.   Specialty: Cardiology Contact information: [address] [place] [address] [phone]         [facility] Emergency Department at [facility] .   Specialty: Emergency Medicine Why: If symptoms worsen Contact information: [address] [place]  [address] [phone]               Discharge Exam: [name]   01/16/24 0941 01/17/24 0224  Weight: 60.9 kg 60.9 kg   General.  Well-developed gentleman, very hard of hearing, in no acute distress. Pulmonary.  Lungs clear bilaterally, normal respiratory effort. CV.  Regular rate and rhythm, no JVD, rub or murmur. Abdomen.  Soft, nontender, nondistended, BS positive. CNS.  Alert and oriented .   No focal neurologic deficit. Extremities.  No edema, no cyanosis, pulses intact and symmetrical. Psychiatry.  Judgment and insight appears normal.   Condition at discharge: stable  The results of significant diagnostics from this hospitalization (including imaging, microbiology, ancillary and laboratory) are listed below for reference.   Imaging Studies: CT ABDOMEN PELVIS WO CONTRAST Result Date: 01/17/2024 CLINICAL DATA:  LUQ abdominal pain EXAM: CT ABDOMEN AND PELVIS WITHOUT CONTRAST TECHNIQUE: Multidetector CT imaging of the abdomen and pelvis was performed following the standard protocol without IV contrast. RADIATION DOSE REDUCTION: This exam was performed according to the departmental dose-optimization program which includes automated exposure control, adjustment of the mA and/or kV according to patient size and/or use of iterative reconstruction technique. COMPARISON:  February 12, 2023 FINDINGS: Of note, the lack of intravenous contrast limits evaluation of the solid organ parenchyma and vascularity. The study is degraded by motion. Lower chest: Trace left pleural effusion Posterior bibasilar dependent atelectasis. Multi-vessel coronary atherosclerosis.  Hepatobiliary: No mass.Fluid-filled and distended without wall thickening or radiopaque stones. No intrahepatic or extrahepatic biliary ductal dilation. Pancreas: No mass or main ductal dilation. No peripancreatic inflammation or fluid collection. Spleen: Normal size. No mass. Adrenals/Urinary Tract: No adrenal masses. No renal mass. Punctate nonobstructive bilateral calyceal calculi. There is a larger interpolar region calculus on the left measuring 4 mm. Circumferential wall thickening of the urinary bladder. Stomach/Bowel: The stomach is decompressed without focal abnormality. No small bowel wall thickening or inflammation. No small bowel obstruction.Normal appendix. Vascular/Lymphatic: No aortic aneurysm. Diffuse aortoiliac atherosclerosis. No  intraabdominal or pelvic lymphadenopathy. Reproductive: Mild prostatomegaly.  No free pelvic fluid. Other: No pneumoperitoneum, ascites, or mesenteric inflammation. Musculoskeletal: No acute fracture or destructive lesion. Diffuse osteopenia. Multilevel degenerative disc disease of the spine. IMPRESSION: 1. Circumferential wall thickening of the urinary bladder, which may be due to underdistension or chronic bladder outlet obstruction in the setting of prostatomegaly. If there is concern for acute cystitis, correlation with urinalysis would be recommended. 2. Nonobstructive bilateral nephrolithiasis.  No hydronephrosis. 3. Mild prostatomegaly. 4. Trace left pleural effusion. Aortic Atherosclerosis (ICD10-I70.0). Electronically Signed   By: Rogelia Myers M.D.   On: 01/17/2024 15:53   CUP PACEART REMOTE DEVICE CHECK Result Date: 01/17/2024 ILR summary report received. Battery status OK. Normal device function. No new symptom, tachy, brady, or pause episodes. No new AF episodes. Monthly summary reports and ROV/PRN AB, CVRS  DG Chest 2 View Result Date: 01/16/2024 CLINICAL DATA:  Chest pain. EXAM: CHEST - 2 VIEW COMPARISON:  09/17/2023. FINDINGS: Bilateral lungs appear hyperexpanded and hyperlucent with coarse bronchovascular markings, in keeping with COPD. Bilateral lungs otherwise appear clear. No dense consolidation or lung collapse. Bilateral costophrenic angles are clear. Stable cardio-mediastinal silhouette. There are surgical staples along the heart border and sternotomy wires, status post CABG (coronary artery bypass graft). There is prosthetic aortic valve. Stable appearance of left lower anterior chest wall loop recorder device. No acute osseous abnormalities. The soft tissues are within normal limits. IMPRESSION: No active cardiopulmonary disease. COPD. Electronically Signed   By: Ree Molt M.D.   On: 01/16/2024 10:03    Microbiology: Results for orders placed or performed during the hospital  encounter of 08/04/23  MRSA Next Gen by PCR, Nasal     Status: Abnormal   Collection Time: 08/06/23  6:35 AM   Specimen: Nasal Mucosa; Nasal Swab  Result Value Ref Range Status   MRSA by PCR Next Gen DETECTED (A) NOT DETECTED Final    Comment: RESULT CALLED TO, READ BACK BY AND VERIFIED WITH: DONNICE SILVAN RN [phone] 08/06/23 HNM (NOTE) The GeneXpert MRSA Assay (FDA approved for NASAL specimens only), is one component of a comprehensive MRSA colonization surveillance program. It is not intended to diagnose MRSA infection nor to guide or monitor treatment for MRSA infections. Test performance is not FDA approved in patients less than 18 years old. Performed at [facility], [address] [place], [place] [address]     Labs: CBC: Recent Labs  Lab 01/16/24 0943 01/17/24 0310  WBC 4.1 5.4  HGB 12.8* 13.3  HCT 40.1 41.8  MCV 88.1 88.0  PLT 115* 150   Basic Metabolic Panel: Recent Labs  Lab 01/16/24 0943 01/17/24 0310  NA 139 138  K 3.7 4.0  CL 103 105  CO2 25 24  GLUCOSE 170* 91  BUN 18 17  CREATININE 1.44* 1.25*  CALCIUM  9.0 8.9   Liver Function Tests: Recent Labs  Lab 01/16/24 0943  AST 24  ALT 20  ALKPHOS 112  BILITOT 0.3  PROT 8.1  ALBUMIN 3.5   CBG: Recent Labs  Lab 01/16/24 2215 01/17/24 0731 01/17/24 1132  GLUCAP 84 94 77    Discharge time spent: greater than 30 minutes.  This record has been created using Conservation officer, historic buildings. Errors have been sought and corrected,but may not always be located. Such creation errors do not reflect on the standard of care.   Signed: Amaryllis Dare, MD [facility] 01/17/2024

## 2024-01-17 NOTE — ED Notes (Signed)
 Visitor left bedside at this time, her name is [name]. She can be called if pt needs a ride home.

## 2024-01-18 DIAGNOSIS — R9431 Abnormal electrocardiogram [ECG] [EKG]: Secondary | ICD-10-CM

## 2024-01-18 DIAGNOSIS — R55 Syncope and collapse: Secondary | ICD-10-CM | POA: Diagnosis not present

## 2024-01-18 DIAGNOSIS — J449 Chronic obstructive pulmonary disease, unspecified: Secondary | ICD-10-CM | POA: Insufficient documentation

## 2024-01-18 LAB — BASIC METABOLIC PANEL WITH GFR
Anion gap: 12 (ref 5–15)
BUN: 32 mg/dL — ABNORMAL HIGH (ref 8–23)
CO2: 21 mmol/L — ABNORMAL LOW (ref 22–32)
Calcium: 8.4 mg/dL — ABNORMAL LOW (ref 8.9–10.3)
Chloride: 106 mmol/L (ref 98–111)
Creatinine, Ser: 2.05 mg/dL — ABNORMAL HIGH (ref 0.61–1.24)
GFR, Estimated: 35 mL/min — ABNORMAL LOW (ref >60)
Glucose, Bld: 128 mg/dL — ABNORMAL HIGH (ref 70–99)
Potassium: 4.6 mmol/L (ref 3.5–5.1)
Sodium: 139 mmol/L (ref 135–145)

## 2024-01-18 LAB — APTT: aPTT: 74 s — ABNORMAL HIGH (ref 24–36)

## 2024-01-18 LAB — MAGNESIUM
Magnesium: 1.9 mg/dL (ref 1.7–2.4)
Magnesium: 1.8 mg/dL (ref 1.7–2.4)

## 2024-01-18 LAB — HEMOGLOBIN A1C
Hgb A1c MFr Bld: 6.5 % — ABNORMAL HIGH (ref 4.8–5.6)
Mean Plasma Glucose: 140 mg/dL

## 2024-01-18 LAB — HEPARIN LEVEL (UNFRACTIONATED): Heparin Unfractionated: >1.1 [IU]/mL — ABNORMAL HIGH (ref 0.30–0.70)

## 2024-01-18 MED ORDER — ACETAMINOPHEN 325 MG PO TABS
650.0000 mg | ORAL_TABLET | ORAL | Status: DC | PRN
Start: 2024-01-18 — End: 2024-01-19

## 2024-01-18 MED ORDER — DIVALPROEX SODIUM ER 250 MG PO TB24
250.0000 mg | ORAL_TABLET | Freq: Every day | ORAL | Status: DC
  Administered 2024-01-18 (×2): 250 mg via ORAL
  Filled 2024-01-18 (×2): qty 1

## 2024-01-18 MED ORDER — POTASSIUM CHLORIDE CRYS ER 20 MEQ PO TBCR
40.0000 meq | EXTENDED_RELEASE_TABLET | Freq: Once | ORAL | Status: AC
  Administered 2024-01-18: 40 meq via ORAL
  Filled 2024-01-18: qty 2

## 2024-01-18 MED ORDER — SODIUM CHLORIDE 0.9 % IV SOLN: INTRAVENOUS | Status: AC

## 2024-01-18 MED ORDER — NITROGLYCERIN 0.4 MG SL SUBL: 0.4000 mg | SUBLINGUAL_TABLET | SUBLINGUAL | Status: DC | PRN

## 2024-01-18 MED ORDER — EMPAGLIFLOZIN 25 MG PO TABS
25.0000 mg | ORAL_TABLET | Freq: Every day | ORAL | Status: DC
  Filled 2024-01-18: qty 1

## 2024-01-18 MED ORDER — HEPARIN (PORCINE) 25000 UT/250ML-% IV SOLN
850.0000 [IU]/h | INTRAVENOUS | Status: DC
  Administered 2024-01-18: 850 [IU]/h via INTRAVENOUS
  Filled 2024-01-18: qty 250

## 2024-01-18 MED ORDER — AMIODARONE HCL 200 MG PO TABS
200.0000 mg | ORAL_TABLET | Freq: Every day | ORAL | Status: DC
  Administered 2024-01-18 – 2024-01-19 (×2): 200 mg via ORAL
  Filled 2024-01-18 (×2): qty 1

## 2024-01-18 MED ORDER — ASPIRIN 81 MG PO TBEC
81.0000 mg | DELAYED_RELEASE_TABLET | Freq: Every day | ORAL | Status: DC
  Administered 2024-01-19: 81 mg via ORAL
  Filled 2024-01-18: qty 1

## 2024-01-18 MED ORDER — MAGNESIUM OXIDE -MG SUPPLEMENT 400 (240 MG) MG PO TABS
800.0000 mg | ORAL_TABLET | Freq: Once | ORAL | Status: AC
  Administered 2024-01-18: 800 mg via ORAL
  Filled 2024-01-18: qty 2

## 2024-01-18 MED ORDER — ATORVASTATIN CALCIUM 80 MG PO TABS
80.0000 mg | ORAL_TABLET | Freq: Every day | ORAL | Status: DC
  Administered 2024-01-18 (×2): 80 mg via ORAL
  Filled 2024-01-18 (×2): qty 4

## 2024-01-18 MED ORDER — APIXABAN 5 MG PO TABS
5.0000 mg | ORAL_TABLET | Freq: Two times a day (BID) | ORAL | Status: DC
  Administered 2024-01-18 – 2024-01-19 (×3): 5 mg via ORAL
  Filled 2024-01-18 (×3): qty 1

## 2024-01-18 MED ORDER — ISOSORBIDE MONONITRATE ER 30 MG PO TB24
30.0000 mg | ORAL_TABLET | Freq: Every day | ORAL | Status: DC
  Administered 2024-01-18 – 2024-01-19 (×2): 30 mg via ORAL
  Filled 2024-01-18 (×2): qty 1

## 2024-01-18 MED ORDER — INSULIN GLARGINE-YFGN 100 UNIT/ML ~~LOC~~ SOLN
10.0000 [IU] | Freq: Every day | SUBCUTANEOUS | Status: DC
  Administered 2024-01-18 – 2024-01-19 (×2): 10 [IU] via SUBCUTANEOUS
  Filled 2024-01-18 (×2): qty 0.1

## 2024-01-18 NOTE — Assessment & Plan Note (Signed)
 History of PE History of paroxysmal A-fib Cautiously continue amiodarone  Not on beta-blockers due to sinus bradycardia Currently on heparin  infusion so holding Eliquis 

## 2024-01-18 NOTE — Assessment & Plan Note (Signed)
 Currently on heparin  infusion Continue amiodarone  Not on beta-blockers

## 2024-01-18 NOTE — Assessment & Plan Note (Signed)
Not on AEDs

## 2024-01-18 NOTE — Assessment & Plan Note (Addendum)
 Follow CTA chest-->negative for PE

## 2024-01-18 NOTE — Assessment & Plan Note (Addendum)
 Avoid QT prolonging drugs Continuous cardiac monitoring We will check magnesium --> Potassium 3.7 so we will administer an additional oral dose

## 2024-01-18 NOTE — Progress Notes (Signed)
 PHARMACY - ANTICOAGULATION CONSULT NOTE  Pharmacy Consult for Heparin   Indication: chest pain/ACS  No Known Allergies  Patient Measurements:    Vital Signs: Temp: 96.6 F (35.9 C) (08/14 2040) Temp Source: Axillary (08/14 2040) BP: 100/53 (08/14 2230) Pulse Rate: 67 (08/14 2230)  Labs: Recent Labs    01/16/24 0943 01/16/24 1507 01/16/24 2329 01/17/24 0310 01/17/24 0918 01/17/24 2039 01/17/24 2250  HGB 12.8*  --   --  13.3  --  14.0  --   HCT 40.1  --   --  41.8  --  44.0  --   PLT 115*  --   --  150  --  150  --   APTT  --   --  109* 76* 57*  --   --   LABPROT  --   --  16.6*  --   --  17.1*  --   INR  --   --  1.3*  --   --  1.3*  --   HEPARINUNFRC  --   --  >1.10*  --  >1.10*  --   --   CREATININE 1.44*  --   --  1.25*  --  2.24*  --   TROPONINIHS 17   < > 27* 37* 21* 35* 29*   < > = values in this interval not displayed.    Estimated Creatinine Clearance: 25.4 mL/min (A) (by C-G formula based on SCr of 2.24 mg/dL (H)).   Medical History: Past Medical History:  Diagnosis Date   CAD (coronary artery disease) of artery bypass graft    s/p CABG x 4 in 1997   CHF (congestive heart failure) (HCC)    COPD (chronic obstructive pulmonary disease) (HCC)    Coronary artery disease    CVA (cerebral vascular accident) (HCC)    Diabetes mellitus without complication (HCC)    Dysrhythmia    GERD (gastroesophageal reflux disease)    HLD (hyperlipidemia)    Hypertension    S/P TAVR (transcatheter aortic valve replacement) 07/05/2021   with Edwards 26mm S3UR via TF approach with Dr. Wonda and Dr. Lucas   Seizures [facility])     Medications:  (Not in a hospital admission)   Assessment: Pharmacy consulted to dose heparin  in this 68 year old male admitted with ACS/NSTEMI.  Pt was recently discharged from [facility] on 8/14 AM ,  was on Eliquis  5 mg PO 5 mg BID PTA, last dose on 8/14 PM. CrCl = 25.4 ml/min  Goal of Therapy:  Heparin  level 0.3-0.7 units/ml aPTT 66 - 102   seconds Monitor platelets by anticoagulation protocol: Yes   Plan:  - No bolus due to recent Eliquis  dose on 8/14 PM.  Resume heparin  gtt at previous rate just before discharged. - Start heparin  infusion at 850 units/hr - use aPTT to guide dosing until correlating with HL - check aPTT and HL 8 hrs after start of drip - CBC daily   Akima Slaugh D 01/18/2024,1:14 AM

## 2024-01-18 NOTE — Assessment & Plan Note (Signed)
-   As needed Tylenol -Outpatient ENT evaluation 

## 2024-01-18 NOTE — Progress Notes (Signed)
 PHARMACY - ANTICOAGULATION CONSULT NOTE  Pharmacy Consult for Heparin   Indication: chest pain/ACS  No Known Allergies  Patient Measurements:    Vital Signs: Temp: 97.9 F (36.6 C) (08/15 0632) Temp Source: Oral (08/15 9367) BP: 131/85 (08/15 0900) Pulse Rate: 88 (08/15 0900)  Labs: Recent Labs    01/16/24 0943 01/16/24 1507 01/16/24 2329 01/17/24 0310 01/17/24 0918 01/17/24 2039 01/17/24 2250 01/18/24 0422 01/18/24 1010  HGB 12.8*  --   --  13.3  --  14.0  --   --   --   HCT 40.1  --   --  41.8  --  44.0  --   --   --   PLT 115*  --   --  150  --  150  --   --   --   APTT  --    < > 109* 76* 57*  --   --   --  74*  LABPROT  --   --  16.6*  --   --  17.1*  --   --   --   INR  --   --  1.3*  --   --  1.3*  --   --   --   HEPARINUNFRC  --   --  >1.10*  --  >1.10*  --   --   --  >1.10*  CREATININE 1.44*  --   --  1.25*  --  2.24*  --  2.05*  --   TROPONINIHS 17   < > 27* 37* 21* 35* 29*  --   --    < > = values in this interval not displayed.    Estimated Creatinine Clearance: 27.8 mL/min (A) (by C-G formula based on SCr of 2.05 mg/dL (H)).   Medical History: Past Medical History:  Diagnosis Date   CAD (coronary artery disease) of artery bypass graft    s/p CABG x 4 in 1997   CHF (congestive heart failure) (HCC)    COPD (chronic obstructive pulmonary disease) (HCC)    Coronary artery disease    CVA (cerebral vascular accident) (HCC)    Diabetes mellitus without complication (HCC)    Dysrhythmia    GERD (gastroesophageal reflux disease)    HLD (hyperlipidemia)    Hypertension    S/P TAVR (transcatheter aortic valve replacement) 07/05/2021   with Edwards 26mm S3UR via TF approach with Dr. Wonda and Dr. Lucas   Seizures [facility])     Medications:  (Not in a hospital admission)   Assessment: Pharmacy consulted to dose heparin  in this 68 year old male admitted with ACS/NSTEMI.  Pt was recently discharged from [facility] on 8/14 AM ,  was on Eliquis  5 mg PO 5 mg BID  PTA, last dose on 8/14 PM. CrCl = 27.8 ml/min  Goal of Therapy:  Heparin  level 0.3-0.7 units/ml aPTT 66 - 102  seconds Monitor platelets by anticoagulation protocol: Yes   Plan:  8/15@1010 : aPTT 74 (therapeutic x 1), HL>1.1 - Continue heparin  infusion at 850 units/hr - use aPTT to guide dosing until correlating with HL - recheck aPTT in 8 hrs  - CBC daily   Donnelle Olmeda A Nechuma Boven, PharmD Clinical Pharmacist 01/18/2024 11:13 AM
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
## 2024-01-18 NOTE — Progress Notes (Signed)
 Interval events noted.  No complaints.  He is feeling better.  Vital signs are stable   Thomas Moore is a 68 y.o. male with medical history significant for A fib and PE on Eliquis , HFrEF(EF 30 to 35%, grade 3 diastolic dysfunction 08/2023), CAD s/p CABG 1997, HTN, DM, seizure prior stroke, orthostatic hypotension, aortic stenosis (s/p of TAVR), bilateral carotid artery stenosis, seizure, chronic back pain,  loop recorder, discharged just a few hours prior from an overnight hospitalization (8/13-8/14/25)for chest pain.  During that hospital stay, he was evaluated by cardiologist.  He was found to have bradycardia with heart rate into the 30s and 40s.  However loop recorder interrogation did not show any significant events.  He was started on Imdur  and spironolactone  prior to discharge.  He was to resume Eliquis  and amiodarone  at discharge.  While waiting for his ride home, he apparently had syncopal events, about 3 episodes each lasting about 10 seconds.  This associated with diaphoresis.  He was checked back into the ED for reevaluation.  He was found to have hypotension and AKI.  Creatinine had gone up from 1.25-2.24.  He was started on IV fluids for hydration.  He has had about 1.5 L of IV fluids thus far.  Hold IV fluids for now given chronic HFrEF.  Monitor BMP. Lasix , Entresto , spironolactone  and Jardiance  have been held. Appreciate input from cardiologist.

## 2024-01-18 NOTE — ED Notes (Signed)
 Pt called out twice for help to call dietary for dinner. Informed him he has diet order and also prior RN did already call dietary for supper tray.

## 2024-01-18 NOTE — Assessment & Plan Note (Signed)
 On heparin  infusion for possible angina Continue statins

## 2024-01-18 NOTE — H&P (Signed)
 History and Physical    Patient: Thomas Moore DOB: 03/30/1956 DOA: 01/17/2024 DOS: the patient was seen and examined on 01/18/2024 PCP: Lenon Layman ORN, MD  Patient coming from: Home  Chief Complaint:  Chief Complaint  Patient presents with   Near Syncope    HPI: Thomas Moore is a 68 y.o. male with medical history significant for A fib and PE on Eliquis , HFrEF(EF 30 to 35% 08/2023), CAD s/p CABG 1997, HTN, DM, seizure prior stroke, orthostatic hypotension, aortic stenosis (s/p of TAVR), bilateral carotid artery stenosis, seizure, chronic back pain,  loop recorder, discharged just a few hours prior from an overnight hospitalization (8/13-8/14/25)for chest pain being admitted for workup of a witnessed syncopal event while awaiting his ride home.  He reportedly had 3 episodes each lasting 10 seconds, and was noted to be diaphoretic. He complained of nausea and chest pain.   He was checked back in to the ED for reevaluation.  During his recent stay, he was  on a heparin  infusion and evaluated by cardiology.  His stay was complicated by sinus bradycardia in the 30s to 40s however loop recorder interrogation revealed no events. He was started on Imdur  and spironolactone   prior to discharge.  He is noted to be on amiodarone , however previous documentation said it was discontinued.   On initial assessment he was hypotensive at 94/73 with pulse of 91 and slightly hypothermic at 96.6.  His EKG showed sinus rhythm at 86 and a prolonged QT of 505.  No acute ST-T wave changes. Repeat labs significant for troponin 35--> 29.  Creatinine of 2.24 (up from 1.25 a few hours earlier) with anion gap of 17 and bicarb 21.  CBC normal. Chest x-ray not repeated CTA negative for acute PE  Patient was bolused with 1 L of sodium chloride  Admission requested   Past Medical History:  Diagnosis Date   CAD (coronary artery disease) of artery bypass graft    s/p CABG x 4 in 1997   CHF (congestive  heart failure) (HCC)    COPD (chronic obstructive pulmonary disease) (HCC)    Coronary artery disease    CVA (cerebral vascular accident) (HCC)    Diabetes mellitus without complication (HCC)    Dysrhythmia    GERD (gastroesophageal reflux disease)    HLD (hyperlipidemia)    Hypertension    S/P TAVR (transcatheter aortic valve replacement) 07/05/2021   with Edwards 26mm S3UR via TF approach with Dr. Wonda and Dr. Lucas   Seizures [facility])    Past Surgical History:  Procedure Laterality Date   BYPASS GRAFT ANGIOGRAPHY N/A 04/25/2021   Procedure: BYPASS GRAFT ANGIOGRAPHY;  Surgeon: Lawyer Bernardino Cough, MD;  Location: [facility] INVASIVE CV LAB;  Service: Cardiovascular;  Laterality: N/A;   CARDIAC SURGERY     CORNEAL TRANSPLANT Right    CORONARY ARTERY BYPASS GRAFT  1997   ELECTROPHYSIOLOGY STUDY N/A 09/08/2021   Procedure: ELECTROPHYSIOLOGY STUDY;  Surgeon: Cindie Ole DASEN, MD;  Location: [facility] INVASIVE CV LAB;  Service: Cardiovascular;  Laterality: N/A;   EYE SURGERY     FLEXOR TENDON REPAIR Left 07/11/2019   Procedure: FLEXOR tenolysis  REPAIR LEFT RING FINGER with tednon repair;  Surgeon: Kathlynn Sharper, MD;  Location: [facility] ORS;  Service: Orthopedics;  Laterality: Left;   INCISION AND DRAINAGE ABSCESS Left 05/08/2019   Procedure: INCISION AND DRAINAGE ABSCESS;  Surgeon: Mardee Lynwood SQUIBB, MD;  Location: [facility] ORS;  Service: Orthopedics;  Laterality: Left;   INTRAOPERATIVE TRANSTHORACIC ECHOCARDIOGRAM N/A 07/05/2021  Procedure: INTRAOPERATIVE TRANSTHORACIC ECHOCARDIOGRAM;  Surgeon: Wonda Sharper, MD;  Location: [facility] OR;  Service: Open Heart Surgery;  Laterality: N/A;   LEFT HEART CATH AND CORONARY ANGIOGRAPHY Left 06/09/2022   Procedure: LEFT HEART CATH AND CORONARY ANGIOGRAPHY;  Surgeon: Fernand Denyse LABOR, MD;  Location: [facility] INVASIVE CV LAB;  Service: Cardiovascular;  Laterality: Left;   LEFT HEART CATH AND CORONARY ANGIOGRAPHY Left 09/27/2023   Procedure: LEFT HEART CATH AND CORONARY ANGIOGRAPHY  with possible coronary intervention;  Surgeon: Fernand Denyse LABOR, MD;  Location: [facility] INVASIVE CV LAB;  Service: Cardiovascular;  Laterality: Left;   LEFT HEART CATH AND CORS/GRAFTS ANGIOGRAPHY N/A 10/02/2019   Procedure: LEFT HEART CATH AND CORS/GRAFTS ANGIOGRAPHY;  Surgeon: Anner Alm ORN, MD;  Location: [facility] INVASIVE CV LAB;  Service: Cardiovascular;  Laterality: N/A;   LEFT HEART CATH AND CORS/GRAFTS ANGIOGRAPHY N/A 07/17/2022   Procedure: LEFT HEART CATH AND CORS/GRAFTS ANGIOGRAPHY and possible PCI and stent;  Surgeon: Fernand Denyse LABOR, MD;  Location: [facility] INVASIVE CV LAB;  Service: Cardiovascular;  Laterality: N/A;   LOOP RECORDER INSERTION N/A 10/06/2019   Procedure: LOOP RECORDER INSERTION;  Surgeon: Waddell Danelle ORN, MD;  Location: [facility] INVASIVE CV LAB;  Service: Cardiovascular;  Laterality: N/A;   TRANSCATHETER AORTIC VALVE REPLACEMENT, TRANSFEMORAL N/A 07/05/2021   Procedure: TRANSCATHETER AORTIC VALVE REPLACEMENT, TRANSFEMORAL;  Surgeon: Wonda Sharper, MD;  Location: [facility] OR;  Service: Open Heart Surgery;  Laterality: N/A;   TRIGGER FINGER RELEASE Left    Social History:  reports that he quit smoking about 41 years ago. His smoking use included cigarettes. He has never used smokeless tobacco. He reports that he does not currently use alcohol. He reports that he does not use drugs.  No Known Allergies  Family History  Problem Relation Age of Onset   Seizures Brother     Prior to Admission medications   Medication Sig Start Date End Date Taking? Authorizing Provider  acetaminophen  (TYLENOL ) 650 MG CR tablet Take 1,300 mg by mouth every 8 (eight) hours as needed for pain.   Yes [provider]  amiodarone  (PACERONE ) 200 MG tablet Take 1 tablet (200 mg total) by mouth 2 (two) times daily. 09/13/23  Yes Fernand Denyse LABOR, MD  apixaban  (ELIQUIS ) 5 MG TABS tablet Take 1 tablet (5 mg total) by mouth 2 (two) times daily. 11/14/22  Yes Riddle, Suzann, NP  atorvastatin  (LIPITOR ) 80 MG tablet  Take 1 tablet (80 mg total) by mouth at bedtime. 08/06/23  Yes Barbarann Nest, MD  Calcium  Citrate-Vitamin D  (CALCIUM  + D PO) Take 1 tablet by mouth 2 (two) times daily.   Yes [provider]  clopidogrel  (PLAVIX ) 75 MG tablet Take 1 tablet by mouth once daily 01/08/24  Yes Fernand Denyse LABOR, MD  fluticasone -salmeterol (ADVAIR) 100-50 MCG/ACT AEPB Inhale into the lungs. 06/25/23  Yes [provider]  furosemide  (LASIX ) 20 MG tablet Take 1 tablet (20 mg total) by mouth daily. 05/23/23  Yes Fernand Denyse LABOR, MD  insulin  degludec (TRESIBA) 100 UNIT/ML FlexTouch Pen Inject 10 Units into the skin daily. 07/11/23  Yes [provider]  isosorbide  mononitrate (IMDUR ) 30 MG 24 hr tablet Take 1 tablet (30 mg total) by mouth daily. 01/18/24  Yes Caleen Qualia, MD  JARDIANCE  25 MG TABS tablet Take 25 mg by mouth daily.   Yes [provider]  Multiple Vitamin (MULTIVITAMIN) capsule Take 1 capsule by mouth daily.   Yes [provider]  nitroGLYCERIN  (NITROSTAT ) 0.4 MG SL tablet Place 1 tablet (0.4  mg total) under the tongue every 5 (five) minutes as needed for chest pain. 08/03/23  Yes Scoggins, Amber, NP  prednisoLONE acetate (PRED FORTE) 1 % ophthalmic suspension Place 1 drop into the left eye 4 (four) times daily. 01/16/24  Yes [provider]  sacubitril -valsartan  (ENTRESTO ) 24-26 MG Take 1 tablet by mouth 2 (two) times daily. 09/13/23  Yes Fernand Denyse LABOR, MD  divalproex  (DEPAKOTE  ER) 250 MG 24 hr tablet Take 1 tablet (250 mg total) by mouth at bedtime. 08/06/23   Barbarann Nest, MD    Physical Exam: Vitals:   01/17/24 2040 01/17/24 2040 01/17/24 2100 01/17/24 2230  BP: (!) 97/59  (!) 104/57 (!) 100/53  Pulse: 86  76 67  Resp: 15  20 17   Temp:  (!) 96.6 F (35.9 C)    TempSrc:  Axillary    SpO2: 100%  98% 99%   Physical Exam Vitals and nursing note reviewed.  Constitutional:      General: He is sleeping. He is not in acute distress.    Comments: Somnolent,  arouses with gentle shaking  HENT:     Head: Normocephalic and atraumatic.  Cardiovascular:     Rate and Rhythm: Normal rate and regular rhythm.     Heart sounds: Normal heart sounds.  Pulmonary:     Effort: Pulmonary effort is normal.     Breath sounds: Normal breath sounds.  Abdominal:     Palpations: Abdomen is soft.     Tenderness: There is no abdominal tenderness.  Neurological:     Mental Status: Mental status is at baseline.     Labs on Admission: I have personally reviewed following labs and imaging studies  CBC: Recent Labs  Lab 01/16/24 0943 01/17/24 0310 01/17/24 2039  WBC 4.1 5.4 5.9  HGB 12.8* 13.3 14.0  HCT 40.1 41.8 44.0  MCV 88.1 88.0 88.0  PLT 115* 150 150   Basic Metabolic Panel: Recent Labs  Lab 01/16/24 0943 01/17/24 0310 01/17/24 2039 01/17/24 2250  NA 139 138 135  --   K 3.7 4.0 3.7  --   CL 103 105 97*  --   CO2 25 24 21*  --   GLUCOSE 170* 91 182*  --   BUN 18 17 30*  --   CREATININE 1.44* 1.25* 2.24*  --   CALCIUM  9.0 8.9 9.6  --   MG  --   --   --  1.9   GFR: Estimated Creatinine Clearance: 25.4 mL/min (A) (by C-G formula based on SCr of 2.24 mg/dL (H)). Liver Function Tests: Recent Labs  Lab 01/16/24 0943  AST 24  ALT 20  ALKPHOS 112  BILITOT 0.3  PROT 8.1  ALBUMIN 3.5   Recent Labs  Lab 01/16/24 0943  LIPASE 80*   No results for input(s): AMMONIA in the last 168 hours. Coagulation Profile: Recent Labs  Lab 01/16/24 2329 01/17/24 2039  INR 1.3* 1.3*   Cardiac Enzymes: No results for input(s): CKTOTAL, CKMB, CKMBINDEX, TROPONINI in the last 168 hours. BNP (last 3 results) No results for input(s): PROBNP in the last 8760 hours. HbA1C: Recent Labs    01/16/24 2329  HGBA1C 6.5*   CBG: Recent Labs  Lab 01/16/24 2215 01/17/24 0731 01/17/24 1132 01/17/24 2042  GLUCAP 84 94 77 174*   Lipid Profile: Recent Labs    01/16/24 1507 01/17/24 0310  CHOL  --  129  HDL  --  37*  LDLCALC  --  79   TRIG 69  65  CHOLHDL  --  3.5   Thyroid  Function Tests: No results for input(s): TSH, T4TOTAL, FREET4, T3FREE, THYROIDAB in the last 72 hours. Anemia Panel: No results for input(s): VITAMINB12, FOLATE, FERRITIN, TIBC, IRON, RETICCTPCT in the last 72 hours. Urine analysis:    Component Value Date/Time   COLORURINE COLORLESS (A) 02/12/2023 1142   APPEARANCEUR CLEAR (A) 02/12/2023 1142   APPEARANCEUR Clear 05/12/2020 0940   LABSPEC 1.005 02/12/2023 1142   PHURINE 7.0 02/12/2023 1142   GLUCOSEU >=500 (A) 02/12/2023 1142   HGBUR MODERATE (A) 02/12/2023 1142   BILIRUBINUR NEGATIVE 02/12/2023 1142   BILIRUBINUR Negative 05/12/2020 0940   KETONESUR NEGATIVE 02/12/2023 1142   PROTEINUR NEGATIVE 02/12/2023 1142   NITRITE NEGATIVE 02/12/2023 1142   LEUKOCYTESUR NEGATIVE 02/12/2023 1142    Radiological Exams on Admission: CT Angio Chest PE W/Cm &/Or Wo Cm Result Date: 01/17/2024 CLINICAL DATA:  High probability for PE.  Weakness and dizziness. EXAM: CT ANGIOGRAPHY CHEST WITH CONTRAST TECHNIQUE: Multidetector CT imaging of the chest was performed using the standard protocol during bolus administration of intravenous contrast. Multiplanar CT image reconstructions and MIPs were obtained to evaluate the vascular anatomy. RADIATION DOSE REDUCTION: This exam was performed according to the departmental dose-optimization program which includes automated exposure control, adjustment of the mA and/or kV according to patient size and/or use of iterative reconstruction technique. CONTRAST:  75mL OMNIPAQUE  IOHEXOL  350 MG/ML SOLN COMPARISON:  CT abdomen and pelvis 02/12/2023. CT angiogram chest 04/13/2021 FINDINGS: Cardiovascular: Aorta is normal in size. The heart is mildly dilated. Aortic valve replacement present. There are atherosclerotic calcifications of the aorta and coronary arteries. There is no pericardial effusion. There is adequate opacification of the pulmonary arteries to the  segmental level. No pulmonary embolism identified. Mediastinum/Nodes: No enlarged mediastinal, hilar, or axillary lymph nodes. Thyroid  gland, trachea, and esophagus demonstrate no significant findings. There are nonenlarged right hilar and subcarinal lymph nodes. Lungs/Pleura: There are patchy ground-glass opacities and atelectatic changes or scarring in the inferior right lower lobe. There is no pleural effusion or pneumothorax. Upper Abdomen: There are punctate bilateral renal calculi. Musculoskeletal: Degenerative changes affect the spine. Sternotomy wires are present. Review of the MIP images confirms the above findings. IMPRESSION: 1. No evidence for pulmonary embolism. 2. Patchy ground-glass opacities and atelectatic changes or scarring in the inferior right lower lobe. Findings may be infectious/inflammatory. 3. Nonobstructing bilateral renal calculi. Aortic Atherosclerosis (ICD10-I70.0). Electronically Signed   By: Greig Pique M.D.   On: 01/17/2024 23:52   CT ABDOMEN PELVIS WO CONTRAST Result Date: 01/17/2024 CLINICAL DATA:  LUQ abdominal pain EXAM: CT ABDOMEN AND PELVIS WITHOUT CONTRAST TECHNIQUE: Multidetector CT imaging of the abdomen and pelvis was performed following the standard protocol without IV contrast. RADIATION DOSE REDUCTION: This exam was performed according to the departmental dose-optimization program which includes automated exposure control, adjustment of the mA and/or kV according to patient size and/or use of iterative reconstruction technique. COMPARISON:  February 12, 2023 FINDINGS: Of note, the lack of intravenous contrast limits evaluation of the solid organ parenchyma and vascularity. The study is degraded by motion. Lower chest: Trace left pleural effusion Posterior bibasilar dependent atelectasis. Multi-vessel coronary atherosclerosis. Hepatobiliary: No mass.Fluid-filled and distended without wall thickening or radiopaque stones. No intrahepatic or extrahepatic biliary ductal  dilation. Pancreas: No mass or main ductal dilation. No peripancreatic inflammation or fluid collection. Spleen: Normal size. No mass. Adrenals/Urinary Tract: No adrenal masses. No renal mass. Punctate nonobstructive bilateral calyceal calculi. There is a larger interpolar region calculus  on the left measuring 4 mm. Circumferential wall thickening of the urinary bladder. Stomach/Bowel: The stomach is decompressed without focal abnormality. No small bowel wall thickening or inflammation. No small bowel obstruction.Normal appendix. Vascular/Lymphatic: No aortic aneurysm. Diffuse aortoiliac atherosclerosis. No intraabdominal or pelvic lymphadenopathy. Reproductive: Mild prostatomegaly.  No free pelvic fluid. Other: No pneumoperitoneum, ascites, or mesenteric inflammation. Musculoskeletal: No acute fracture or destructive lesion. Diffuse osteopenia. Multilevel degenerative disc disease of the spine. IMPRESSION: 1. Circumferential wall thickening of the urinary bladder, which may be due to underdistension or chronic bladder outlet obstruction in the setting of prostatomegaly. If there is concern for acute cystitis, correlation with urinalysis would be recommended. 2. Nonobstructive bilateral nephrolithiasis.  No hydronephrosis. 3. Mild prostatomegaly. 4. Trace left pleural effusion. Aortic Atherosclerosis (ICD10-I70.0). Electronically Signed   By: Rogelia Myers M.D.   On: 01/17/2024 15:53   CUP PACEART REMOTE DEVICE CHECK Result Date: 01/17/2024 ILR summary report received. Battery status OK. Normal device function. No new symptom, tachy, brady, or pause episodes. No new AF episodes. Monthly summary reports and ROV/PRN AB, CVRS  DG Chest 2 View Result Date: 01/16/2024 CLINICAL DATA:  Chest pain. EXAM: CHEST - 2 VIEW COMPARISON:  09/17/2023. FINDINGS: Bilateral lungs appear hyperexpanded and hyperlucent with coarse bronchovascular markings, in keeping with COPD. Bilateral lungs otherwise appear clear. No dense  consolidation or lung collapse. Bilateral costophrenic angles are clear. Stable cardio-mediastinal silhouette. There are surgical staples along the heart border and sternotomy wires, status post CABG (coronary artery bypass graft). There is prosthetic aortic valve. Stable appearance of left lower anterior chest wall loop recorder device. No acute osseous abnormalities. The soft tissues are within normal limits. IMPRESSION: No active cardiopulmonary disease. COPD. Electronically Signed   By: Ree Molt M.D.   On: 01/16/2024 10:03   Data Reviewed for HPI: Relevant notes from primary care and specialist visits, past discharge summaries as available in EHR, including Care Everywhere. Prior diagnostic testing as pertinent to current admission diagnoses Updated medications and problem lists for reconciliation ED course, including vitals, labs, imaging, treatment and response to treatment Triage notes, nursing and pharmacy notes and ED provider's notes Notable results as noted above in HPI      Assessment and Plan: Syncope Medication induced hypotension with history of orthostatic hypotension Recent sinus bradycardia, 30s to 40s on 01/16/2024 (loop recorder interrogation 01/17/2024-no events) QT prolongation on EKG 01/17/2024 BP 94/73 on return to ED Patient started on Imdur  and spironolactone  earlier in the a.m.--suspecting likely culprits ?  Amiodarone  Hold blood pressure lowering medications for now Continuous cardiac monitoring S/p loop recorder interrogation on a.m. of 8/14 and no events seen Hold QT prolonging drugs DC amiodarone  pending clarification on whether patient should be taking it or not.(Prior discontinuation earlier this year) Cardiology reconsulted   Unstable angina (HCC) CAD with history of CABG LHC 07/2022:(100% stenosis Prox RCA /99% stenosis Ost Cx to Prox Cx/95% stenosis Mid LAD/100% Origin to Prox Graft ) Patient had chest pain and diaphoresis during syncopal  events Troponin 35-->29 EKG non acute Continue medical management with Plavix , atorvastatin .  Will continue heparin  overnight Reconsult cardiology Will keep n.p.o. in case of procedure  AKI (acute kidney injury) (HCC) High anion gap metabolic acidosis Suspect ATN secondary to renal hypoperfusion from hypotension Received an NS bolus in the ED We will continue IV fluids to keep MAP over 65 Monitor for fluid overload in view of HFrEF Daily weights  Prolonged QT interval Avoid QT prolonging drugs Continuous cardiac monitoring We will check  magnesium --> Potassium 3.7 so we will administer an additional oral dose  Aortic stenosis S/P TAVR (transcatheter aortic valve replacement) Aortic valve appeared normal on echo in March 2025 We will hold off on echo pending cardiology consult  Long term (current) use of anticoagulants History of PE History of paroxysmal A-fib Cautiously continue amiodarone  Not on beta-blockers due to sinus bradycardia Currently on heparin  infusion so holding Eliquis   History of stroke On heparin  infusion for possible angina Continue statins  Paroxysmal atrial fibrillation (HCC) Currently on heparin  infusion Continue amiodarone  Not on beta-blockers  Hx of pulmonary embolus Follow CTA chest-->negative for PE  History of seizure Not on AEDs  COPD (chronic obstructive pulmonary disease) (HCC) DuoNebs as needed  Chronic back pain As needed Tylenol   Diabetes mellitus, type II (HCC) Continue basal insulin  with sliding scale coverage        DVT prophylaxis: heparin   Consults: cardiology, [facility]  Advance Care Planning:   Code Status: Prior   Family Communication: none  Disposition Plan: Back to previous home environment  Severity of Illness: The appropriate patient status for this patient is OBSERVATION. Observation status is judged to be reasonable and necessary in order to provide the required intensity of service to ensure the  patient's safety. The patient's presenting symptoms, physical exam findings, and initial radiographic and laboratory data in the context of their medical condition is felt to place them at decreased risk for further clinical deterioration. Furthermore, it is anticipated that the patient will be medically stable for discharge from the hospital within 2 midnights of admission.   Author: Delayne LULLA Solian, MD 01/18/2024 1:07 AM  For on call review [email or web address].

## 2024-01-18 NOTE — Assessment & Plan Note (Signed)
 DuoNebs as needed

## 2024-01-18 NOTE — Assessment & Plan Note (Signed)
Continue basal insulin with sliding scale coverage

## 2024-01-18 NOTE — Assessment & Plan Note (Signed)
 Aortic valve appeared normal on echo in March 2025 We will hold off on echo pending cardiology consult

## 2024-01-18 NOTE — Hospital Course (Signed)
 Thomas Moore

## 2024-01-18 NOTE — Assessment & Plan Note (Signed)
 High anion gap metabolic acidosis Suspect ATN secondary to renal hypoperfusion from hypotension Received an NS bolus in the ED We will continue IV fluids to keep MAP over 65 Monitor for fluid overload in view of HFrEF Daily weights

## 2024-01-18 NOTE — ED Notes (Signed)
 Pt called on call bell to have covers straightened. Pt also was asking for menu and for us  to call. Pt has also called out numerous times for help with phone and hearing aid.

## 2024-01-19 DIAGNOSIS — R55 Syncope and collapse: Secondary | ICD-10-CM | POA: Diagnosis not present

## 2024-01-19 LAB — BASIC METABOLIC PANEL WITH GFR
Anion gap: 10 (ref 5–15)
BUN: 28 mg/dL — ABNORMAL HIGH (ref 8–23)
CO2: 24 mmol/L (ref 22–32)
Calcium: 8.3 mg/dL — ABNORMAL LOW (ref 8.9–10.3)
Chloride: 104 mmol/L (ref 98–111)
Creatinine, Ser: 1.57 mg/dL — ABNORMAL HIGH (ref 0.61–1.24)
GFR, Estimated: 48 mL/min — ABNORMAL LOW (ref >60)
Glucose, Bld: 103 mg/dL — ABNORMAL HIGH (ref 70–99)
Potassium: 4.1 mmol/L (ref 3.5–5.1)
Sodium: 138 mmol/L (ref 135–145)

## 2024-01-19 LAB — GLUCOSE, CAPILLARY: Glucose-Capillary: 107 mg/dL — ABNORMAL HIGH (ref 70–99)

## 2024-01-19 MED ORDER — SACUBITRIL-VALSARTAN 24-26 MG PO TABS
1.0000 | ORAL_TABLET | Freq: Two times a day (BID) | ORAL | Status: DC
  Administered 2024-01-19: 1 via ORAL
  Filled 2024-01-19: qty 1

## 2024-01-19 MED ORDER — CLOPIDOGREL BISULFATE 75 MG PO TABS
75.0000 mg | ORAL_TABLET | Freq: Every day | ORAL | Status: DC
Start: 2024-01-19 — End: 2024-01-19
  Administered 2024-01-19: 75 mg via ORAL
  Filled 2024-01-19: qty 1

## 2024-01-19 NOTE — Care Management Obs Status (Signed)
 MEDICARE OBSERVATION STATUS NOTIFICATION   Patient Details  Name: Thomas Moore MRN: [id number] Date of Birth: June 24, 1955   Medicare Observation Status Notification Given:  Yes documented today, completed on 01/17/24    [name] 01/19/2024, 9:19 AM
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
## 2024-01-19 NOTE — Discharge Summary (Signed)
 Physician Discharge Summary   Patient: Thomas Moore MRN: [id number] DOB: [birth year]  Admit date:     01/17/2024  Discharge date: 01/19/24  Discharge Physician: AIDA CHO   PCP: Lenon Layman ORN, MD   Recommendations at discharge:      Follow-up with PCP in 1 week Follow-up with Dr. Fernand, cardiologist, in 1 week  Discharge Diagnoses: Principal Problem:   Syncope and collapse Active Problems:   Unstable angina (HCC)   Syncope   Hypotension due to medication   AKI (acute kidney injury) (HCC)   High anion gap metabolic acidosis   Prolonged QT interval   Aortic stenosis S/P TAVR (transcatheter aortic valve replacement)   Long term (current) use of anticoagulants   History of stroke   Hx of pulmonary embolus   Paroxysmal atrial fibrillation (HCC)   History of seizure   Diabetes mellitus, type II (HCC)   Chronic back pain   COPD (chronic obstructive pulmonary disease) (HCC)  Resolved Problems:   * No resolved hospital problems. *  Hospital Course:   Thomas Moore is a 68 y.o. male with medical history significant for A fib and PE on Eliquis , HFrEF(EF 30 to 35%, grade 3 diastolic dysfunction 08/2023), CAD s/p CABG 1997, HTN, DM, seizure prior stroke, orthostatic hypotension, aortic stenosis (s/p of TAVR), bilateral carotid artery stenosis, seizure, chronic back pain,  loop recorder, discharged just a few hours prior from an overnight hospitalization (8/13-8/14/25)for chest pain.  During that hospital stay, he was evaluated by cardiologist.  He was found to have bradycardia with heart rate into the 30s and 40s.  However loop recorder interrogation did not show any significant events.  He was started on Imdur  and spironolactone  prior to discharge.  He was to resume Eliquis  and amiodarone  at discharge.   While waiting for his ride home, he apparently had syncopal events, about 3 episodes each lasting about 10 seconds.  This associated with diaphoresis.  He was checked back into  the ED for reevaluation.   He was found to have hypotension and AKI.  Creatinine had gone up from 1.25-2.24.  He was started on IV fluids for hydration.     Assessment and Plan:   S/p syncope Medication induced hypotension with history of orthostatic hypotension Recent sinus bradycardia, 30s to 40s on 01/16/2024 (loop recorder interrogation 01/17/2024-no events) QT prolongation on EKG 01/17/2024 Hypotension has improved.    Unstable angina (HCC) CAD with history of CABG LHC 07/2022:(100% stenosis Prox RCA /99% stenosis Ost Cx to Prox Cx/95% stenosis Mid LAD/100% Origin to Prox Graft ) Patient had chest pain and diaphoresis during syncopal events Troponin 35-->29 EKG non acute Continue Plavix , Lipitor  and Imdur    Chronic HFrEF and HFpEF Recommendations from cardiology as noted. Lasix  has been held at discharge. Continue Entresto  2D echo in March 2025 showed EF estimated at 30 to 35% grade 3 diastolic dysfunction.    AKI (acute kidney injury) (HCC) CKD stage IIIa High anion gap metabolic acidosis Improved.  Creatinine down from 2.24-1.57.    Prolonged QT interval Improved from 500-471.    Aortic stenosis S/P TAVR (transcatheter aortic valve replacement) Aortic valve appeared normal on echo in March 2025    Long term (current) use of anticoagulants History of PE History of paroxysmal A-fib Continue amiodarone  and Eliquis  Not on beta-blockers due to sinus bradycardia    History of stroke Continue Eliquis     Hx of pulmonary embolus Follow CTA chest-->negative for PE    History of seizure Not  on AEDs    COPD (chronic obstructive pulmonary disease) (HCC) DuoNebs as needed    Chronic back pain As needed Tylenol     Diabetes mellitus, type II (HCC) Continue basal insulin  with sliding scale coverage       His condition has improved.  He was able to ambulate in the hallway without any symptoms.  He is deemed stable for discharge to home  today.              Consultants: Cardiologist Procedures performed: None Disposition: Home Diet recommendation:  Discharge Diet Orders (From admission, onward)     Start     Ordered   01/19/24 0000  Diet - low sodium heart healthy        01/19/24 1211           Cardiac diet DISCHARGE MEDICATION: Allergies as of 01/19/2024   No Known Allergies      Medication List     STOP taking these medications    furosemide  20 MG tablet Commonly known as: Lasix        TAKE these medications    acetaminophen  650 MG CR tablet Commonly known as: TYLENOL  Take 1,300 mg by mouth every 8 (eight) hours as needed for pain.   amiodarone  200 MG tablet Commonly known as: Pacerone  Take 1 tablet (200 mg total) by mouth 2 (two) times daily.   apixaban  5 MG Tabs tablet Commonly known as: ELIQUIS  Take 1 tablet (5 mg total) by mouth 2 (two) times daily.   atorvastatin  80 MG tablet Commonly known as: LIPITOR  Take 1 tablet (80 mg total) by mouth at bedtime.   CALCIUM  + D PO Take 1 tablet by mouth 2 (two) times daily.   clopidogrel  75 MG tablet Commonly known as: PLAVIX  Take 1 tablet by mouth once daily   divalproex  250 MG 24 hr tablet Commonly known as: DEPAKOTE  ER Take 1 tablet (250 mg total) by mouth at bedtime.   Entresto  24-26 MG Generic drug: sacubitril -valsartan  Take 1 tablet by mouth 2 (two) times daily.   fluticasone -salmeterol 100-50 MCG/ACT Aepb Commonly known as: ADVAIR Inhale into the lungs.   insulin  degludec 100 UNIT/ML FlexTouch Pen Commonly known as: TRESIBA Inject 10 Units into the skin daily.   isosorbide  mononitrate 30 MG 24 hr tablet Commonly known as: IMDUR  Take 1 tablet (30 mg total) by mouth daily.   Jardiance  25 MG Tabs tablet Generic drug: empagliflozin  Take 25 mg by mouth daily.   multivitamin capsule Take 1 capsule by mouth daily.   nitroGLYCERIN  0.4 MG SL tablet Commonly known as: NITROSTAT  Place 1 tablet (0.4 mg total) under  the tongue every 5 (five) minutes as needed for chest pain.   prednisoLONE acetate 1 % ophthalmic suspension Commonly known as: PRED FORTE Place 1 drop into the left eye 4 (four) times daily.        Follow-up Information     Fernand Denyse LABOR, MD. Go in 1 week(s).   Specialty: Cardiology Contact information: [address] [place] [address] [phone]                Discharge Exam: [name]   01/18/24 2140  Weight: 56.2 kg   GEN: NAD SKIN: Warm and dry EYES: No pallor or icterus ENT: MMM, hard of hearing CV: RRR PULM: CTA B ABD: soft, ND, NT, +BS CNS: AAO x 3, non focal EXT: No edema or tenderness   Condition at discharge: good  The results of significant diagnostics from this hospitalization (  including imaging, microbiology, ancillary and laboratory) are listed below for reference.   Imaging Studies: CT Angio Chest PE W/Cm &/Or Wo Cm Result Date: 01/17/2024 CLINICAL DATA:  High probability for PE.  Weakness and dizziness. EXAM: CT ANGIOGRAPHY CHEST WITH CONTRAST TECHNIQUE: Multidetector CT imaging of the chest was performed using the standard protocol during bolus administration of intravenous contrast. Multiplanar CT image reconstructions and MIPs were obtained to evaluate the vascular anatomy. RADIATION DOSE REDUCTION: This exam was performed according to the departmental dose-optimization program which includes automated exposure control, adjustment of the mA and/or kV according to patient size and/or use of iterative reconstruction technique. CONTRAST:  75mL OMNIPAQUE  IOHEXOL  350 MG/ML SOLN COMPARISON:  CT abdomen and pelvis 02/12/2023. CT angiogram chest 04/13/2021 FINDINGS: Cardiovascular: Aorta is normal in size. The heart is mildly dilated. Aortic valve replacement present. There are atherosclerotic calcifications of the aorta and coronary arteries. There is no pericardial effusion. There is adequate opacification of the pulmonary arteries to the  segmental level. No pulmonary embolism identified. Mediastinum/Nodes: No enlarged mediastinal, hilar, or axillary lymph nodes. Thyroid  gland, trachea, and esophagus demonstrate no significant findings. There are nonenlarged right hilar and subcarinal lymph nodes. Lungs/Pleura: There are patchy ground-glass opacities and atelectatic changes or scarring in the inferior right lower lobe. There is no pleural effusion or pneumothorax. Upper Abdomen: There are punctate bilateral renal calculi. Musculoskeletal: Degenerative changes affect the spine. Sternotomy wires are present. Review of the MIP images confirms the above findings. IMPRESSION: 1. No evidence for pulmonary embolism. 2. Patchy ground-glass opacities and atelectatic changes or scarring in the inferior right lower lobe. Findings may be infectious/inflammatory. 3. Nonobstructing bilateral renal calculi. Aortic Atherosclerosis (ICD10-I70.0). Electronically Signed   By: Greig Pique M.D.   On: 01/17/2024 23:52   CT ABDOMEN PELVIS WO CONTRAST Result Date: 01/17/2024 CLINICAL DATA:  LUQ abdominal pain EXAM: CT ABDOMEN AND PELVIS WITHOUT CONTRAST TECHNIQUE: Multidetector CT imaging of the abdomen and pelvis was performed following the standard protocol without IV contrast. RADIATION DOSE REDUCTION: This exam was performed according to the departmental dose-optimization program which includes automated exposure control, adjustment of the mA and/or kV according to patient size and/or use of iterative reconstruction technique. COMPARISON:  February 12, 2023 FINDINGS: Of note, the lack of intravenous contrast limits evaluation of the solid organ parenchyma and vascularity. The study is degraded by motion. Lower chest: Trace left pleural effusion Posterior bibasilar dependent atelectasis. Multi-vessel coronary atherosclerosis. Hepatobiliary: No mass.Fluid-filled and distended without wall thickening or radiopaque stones. No intrahepatic or extrahepatic biliary ductal  dilation. Pancreas: No mass or main ductal dilation. No peripancreatic inflammation or fluid collection. Spleen: Normal size. No mass. Adrenals/Urinary Tract: No adrenal masses. No renal mass. Punctate nonobstructive bilateral calyceal calculi. There is a larger interpolar region calculus on the left measuring 4 mm. Circumferential wall thickening of the urinary bladder. Stomach/Bowel: The stomach is decompressed without focal abnormality. No small bowel wall thickening or inflammation. No small bowel obstruction.Normal appendix. Vascular/Lymphatic: No aortic aneurysm. Diffuse aortoiliac atherosclerosis. No intraabdominal or pelvic lymphadenopathy. Reproductive: Mild prostatomegaly.  No free pelvic fluid. Other: No pneumoperitoneum, ascites, or mesenteric inflammation. Musculoskeletal: No acute fracture or destructive lesion. Diffuse osteopenia. Multilevel degenerative disc disease of the spine. IMPRESSION: 1. Circumferential wall thickening of the urinary bladder, which may be due to underdistension or chronic bladder outlet obstruction in the setting of prostatomegaly. If there is concern for acute cystitis, correlation with urinalysis would be recommended. 2. Nonobstructive bilateral nephrolithiasis.  No hydronephrosis. 3. Mild prostatomegaly. 4.  Trace left pleural effusion. Aortic Atherosclerosis (ICD10-I70.0). Electronically Signed   By: Rogelia Myers M.D.   On: 01/17/2024 15:53   CUP PACEART REMOTE DEVICE CHECK Result Date: 01/17/2024 ILR summary report received. Battery status OK. Normal device function. No new symptom, tachy, brady, or pause episodes. No new AF episodes. Monthly summary reports and ROV/PRN AB, CVRS  DG Chest 2 View Result Date: 01/16/2024 CLINICAL DATA:  Chest pain. EXAM: CHEST - 2 VIEW COMPARISON:  09/17/2023. FINDINGS: Bilateral lungs appear hyperexpanded and hyperlucent with coarse bronchovascular markings, in keeping with COPD. Bilateral lungs otherwise appear clear. No dense  consolidation or lung collapse. Bilateral costophrenic angles are clear. Stable cardio-mediastinal silhouette. There are surgical staples along the heart border and sternotomy wires, status post CABG (coronary artery bypass graft). There is prosthetic aortic valve. Stable appearance of left lower anterior chest wall loop recorder device. No acute osseous abnormalities. The soft tissues are within normal limits. IMPRESSION: No active cardiopulmonary disease. COPD. Electronically Signed   By: Ree Molt M.D.   On: 01/16/2024 10:03    Microbiology: Results for orders placed or performed during the hospital encounter of 08/04/23  MRSA Next Gen by PCR, Nasal     Status: Abnormal   Collection Time: 08/06/23  6:35 AM   Specimen: Nasal Mucosa; Nasal Swab  Result Value Ref Range Status   MRSA by PCR Next Gen DETECTED (A) NOT DETECTED Final    Comment: RESULT CALLED TO, READ BACK BY AND VERIFIED WITH: DONNICE SILVAN RN [phone] 08/06/23 HNM (NOTE) The GeneXpert MRSA Assay (FDA approved for NASAL specimens only), is one component of a comprehensive MRSA colonization surveillance program. It is not intended to diagnose MRSA infection nor to guide or monitor treatment for MRSA infections. Test performance is not FDA approved in patients less than 40 years old. Performed at [facility], [address] [place], [place] [address]     Labs: CBC: Recent Labs  Lab 01/16/24 [phone] 01/17/24 0310 01/17/24 2039  WBC 4.1 5.4 5.9  HGB 12.8* 13.3 14.0  HCT 40.1 41.8 44.0  MCV 88.1 88.0 88.0  PLT 115* 150 150   Basic Metabolic Panel: Recent Labs  Lab 01/16/24 0943 01/17/24 0310 01/17/24 2039 01/17/24 2250 01/18/24 0422 01/19/24 0405  NA 139 138 135  --  139 138  K 3.7 4.0 3.7  --  4.6 4.1  CL 103 105 97*  --  106 104  CO2 25 24 21*  --  21* 24  GLUCOSE 170* 91 182*  --  128* 103*  BUN 18 17 30*  --  32* 28*  CREATININE 1.44* 1.25* 2.24*  --  2.05* 1.57*  CALCIUM  9.0 8.9 9.6  --  8.4*  8.3*  MG  --   --   --  1.9 1.8  --    Liver Function Tests: Recent Labs  Lab 01/16/24 0943  AST 24  ALT 20  ALKPHOS 112  BILITOT 0.3  PROT 8.1  ALBUMIN 3.5   CBG: Recent Labs  Lab 01/16/24 2215 01/17/24 0731 01/17/24 1132 01/17/24 2042 01/19/24 0822  GLUCAP 84 94 77 174* 107*    Discharge time spent: greater than 30 minutes.  Signed: AIDA CHO, MD [facility] 01/19/2024

## 2024-01-22 ENCOUNTER — Other Ambulatory Visit: Payer: Self-pay

## 2024-01-22 ENCOUNTER — Telehealth: Payer: Self-pay

## 2024-01-22 DIAGNOSIS — I951 Orthostatic hypotension: Secondary | ICD-10-CM

## 2024-01-22 DIAGNOSIS — I252 Old myocardial infarction: Secondary | ICD-10-CM

## 2024-01-22 DIAGNOSIS — R42 Dizziness and giddiness: Secondary | ICD-10-CM

## 2024-01-22 DIAGNOSIS — I5042 Chronic combined systolic (congestive) and diastolic (congestive) heart failure: Secondary | ICD-10-CM

## 2024-01-22 DIAGNOSIS — I480 Paroxysmal atrial fibrillation: Secondary | ICD-10-CM

## 2024-01-22 DIAGNOSIS — I10 Essential (primary) hypertension: Secondary | ICD-10-CM

## 2024-01-22 DIAGNOSIS — N179 Acute kidney failure, unspecified: Secondary | ICD-10-CM

## 2024-01-22 DIAGNOSIS — R0602 Shortness of breath: Secondary | ICD-10-CM

## 2024-01-22 MED ORDER — SACUBITRIL-VALSARTAN 24-26 MG PO TABS: 1.0000 | ORAL_TABLET | Freq: Two times a day (BID) | ORAL | 1 refills | Status: DC

## 2024-01-22 MED ORDER — AMIODARONE HCL 200 MG PO TABS: 200.0000 mg | ORAL_TABLET | Freq: Two times a day (BID) | ORAL | 1 refills | Status: DC

## 2024-01-22 MED ORDER — CLOPIDOGREL BISULFATE 75 MG PO TABS: 75.0000 mg | ORAL_TABLET | Freq: Every day | ORAL | 1 refills | Status: DC

## 2024-01-22 MED ORDER — ISOSORBIDE MONONITRATE ER 30 MG PO TB24: 30.0000 mg | ORAL_TABLET | Freq: Every day | ORAL | 1 refills | Status: DC

## 2024-01-22 NOTE — Telephone Encounter (Signed)
 Pt called asking if you can send all of his rx refills to [facility]? Mentioned since he isn't driving anymore it would be easier for him. please advise

## 2024-01-22 NOTE — Telephone Encounter (Signed)
 Rx sent.

## 2024-01-25 ENCOUNTER — Encounter: Payer: Self-pay | Admitting: Cardiovascular Disease

## 2024-01-25 ENCOUNTER — Ambulatory Visit: Admitting: Cardiovascular Disease

## 2024-01-25 VITALS — BP 122/62 | HR 85 | Ht 63.0 in | Wt 135.0 lb

## 2024-01-25 DIAGNOSIS — I480 Paroxysmal atrial fibrillation: Secondary | ICD-10-CM

## 2024-01-25 DIAGNOSIS — R55 Syncope and collapse: Secondary | ICD-10-CM

## 2024-01-25 DIAGNOSIS — I252 Old myocardial infarction: Secondary | ICD-10-CM

## 2024-01-25 DIAGNOSIS — N179 Acute kidney failure, unspecified: Secondary | ICD-10-CM

## 2024-01-25 DIAGNOSIS — R0789 Other chest pain: Secondary | ICD-10-CM

## 2024-01-25 DIAGNOSIS — I951 Orthostatic hypotension: Secondary | ICD-10-CM

## 2024-01-25 DIAGNOSIS — R0602 Shortness of breath: Secondary | ICD-10-CM | POA: Diagnosis not present

## 2024-01-25 DIAGNOSIS — I2510 Atherosclerotic heart disease of native coronary artery without angina pectoris: Secondary | ICD-10-CM

## 2024-01-25 DIAGNOSIS — I5042 Chronic combined systolic (congestive) and diastolic (congestive) heart failure: Secondary | ICD-10-CM | POA: Diagnosis not present

## 2024-01-25 DIAGNOSIS — R42 Dizziness and giddiness: Secondary | ICD-10-CM

## 2024-01-25 NOTE — Progress Notes (Signed)
 Cardiology Office Note   Date:  01/25/2024   ID:  Thomas Moore, DOB 12/08/1955, MRN [id number]  PCP:  Lenon Layman ORN, MD  Cardiologist:  Denyse Bathe, MD      History of Present Illness: Thomas Moore is a 68 y.o. male who presents for  Chief Complaint  Patient presents with   Hospitalization Follow-up    Hospital Follow up    Had to go to [facility] by EMS as had chest pain and SOB.      Past Medical History:  Diagnosis Date   CAD (coronary artery disease) of artery bypass graft    s/p CABG x 4 in 1997   CHF (congestive heart failure) (HCC)    COPD (chronic obstructive pulmonary disease) (HCC)    Coronary artery disease    CVA (cerebral vascular accident) (HCC)    Diabetes mellitus without complication (HCC)    Dysrhythmia    GERD (gastroesophageal reflux disease)    HLD (hyperlipidemia)    Hypertension    S/P TAVR (transcatheter aortic valve replacement) 07/05/2021   with Edwards 26mm S3UR via TF approach with Dr. Wonda and Dr. Lucas   Seizures [facility])      Past Surgical History:  Procedure Laterality Date   BYPASS GRAFT ANGIOGRAPHY N/A 04/25/2021   Procedure: BYPASS GRAFT ANGIOGRAPHY;  Surgeon: Lawyer Bernardino Cough, MD;  Location: [facility] INVASIVE CV LAB;  Service: Cardiovascular;  Laterality: N/A;   CARDIAC SURGERY     CORNEAL TRANSPLANT Right    CORONARY ARTERY BYPASS GRAFT  1997   ELECTROPHYSIOLOGY STUDY N/A 09/08/2021   Procedure: ELECTROPHYSIOLOGY STUDY;  Surgeon: Cindie Ole DASEN, MD;  Location: [facility] INVASIVE CV LAB;  Service: Cardiovascular;  Laterality: N/A;   EYE SURGERY     FLEXOR TENDON REPAIR Left 07/11/2019   Procedure: FLEXOR tenolysis  REPAIR LEFT RING FINGER with tednon repair;  Surgeon: Kathlynn Sharper, MD;  Location: [facility] ORS;  Service: Orthopedics;  Laterality: Left;   INCISION AND DRAINAGE ABSCESS Left 05/08/2019   Procedure: INCISION AND DRAINAGE ABSCESS;  Surgeon: Mardee Lynwood SQUIBB, MD;  Location: [facility] ORS;  Service: Orthopedics;   Laterality: Left;   INTRAOPERATIVE TRANSTHORACIC ECHOCARDIOGRAM N/A 07/05/2021   Procedure: INTRAOPERATIVE TRANSTHORACIC ECHOCARDIOGRAM;  Surgeon: Wonda Sharper, MD;  Location: [facility] OR;  Service: Open Heart Surgery;  Laterality: N/A;   LEFT HEART CATH AND CORONARY ANGIOGRAPHY Left 06/09/2022   Procedure: LEFT HEART CATH AND CORONARY ANGIOGRAPHY;  Surgeon: Bathe Denyse LABOR, MD;  Location: [facility] INVASIVE CV LAB;  Service: Cardiovascular;  Laterality: Left;   LEFT HEART CATH AND CORONARY ANGIOGRAPHY Left 09/27/2023   Procedure: LEFT HEART CATH AND CORONARY ANGIOGRAPHY with possible coronary intervention;  Surgeon: Bathe Denyse LABOR, MD;  Location: [facility] INVASIVE CV LAB;  Service: Cardiovascular;  Laterality: Left;   LEFT HEART CATH AND CORS/GRAFTS ANGIOGRAPHY N/A 10/02/2019   Procedure: LEFT HEART CATH AND CORS/GRAFTS ANGIOGRAPHY;  Surgeon: Anner Alm ORN, MD;  Location: [facility] INVASIVE CV LAB;  Service: Cardiovascular;  Laterality: N/A;   LEFT HEART CATH AND CORS/GRAFTS ANGIOGRAPHY N/A 07/17/2022   Procedure: LEFT HEART CATH AND CORS/GRAFTS ANGIOGRAPHY and possible PCI and stent;  Surgeon: Bathe Denyse LABOR, MD;  Location: [facility] INVASIVE CV LAB;  Service: Cardiovascular;  Laterality: N/A;   LOOP RECORDER INSERTION N/A 10/06/2019   Procedure: LOOP RECORDER INSERTION;  Surgeon: Waddell Danelle ORN, MD;  Location: [facility] INVASIVE CV LAB;  Service: Cardiovascular;  Laterality: N/A;   TRANSCATHETER AORTIC VALVE REPLACEMENT, TRANSFEMORAL N/A 07/05/2021   Procedure: TRANSCATHETER  AORTIC VALVE REPLACEMENT, TRANSFEMORAL;  Surgeon: Wonda Sharper, MD;  Location: [facility] OR;  Service: Open Heart Surgery;  Laterality: N/A;   TRIGGER FINGER RELEASE Left      Current Outpatient Medications  Medication Sig Dispense Refill   acetaminophen  (TYLENOL ) 650 MG CR tablet Take 1,300 mg by mouth every 8 (eight) hours as needed for pain.     amiodarone  (PACERONE ) 200 MG tablet Take 1 tablet (200 mg total) by mouth 2 (two) times daily. 180 tablet 1    apixaban  (ELIQUIS ) 5 MG TABS tablet Take 1 tablet (5 mg total) by mouth 2 (two) times daily. 180 tablet 1   atorvastatin  (LIPITOR ) 80 MG tablet Take 1 tablet (80 mg total) by mouth at bedtime. 30 tablet 0   Calcium  Citrate-Vitamin D  (CALCIUM  + D PO) Take 1 tablet by mouth 2 (two) times daily.     clopidogrel  (PLAVIX ) 75 MG tablet Take 1 tablet (75 mg total) by mouth daily. 90 tablet 1   divalproex  (DEPAKOTE  ER) 250 MG 24 hr tablet Take 1 tablet (250 mg total) by mouth at bedtime. 30 tablet 0   fluticasone -salmeterol (ADVAIR) 100-50 MCG/ACT AEPB Inhale into the lungs.     insulin  degludec (TRESIBA) 100 UNIT/ML FlexTouch Pen Inject 10 Units into the skin daily.     isosorbide  mononitrate (IMDUR ) 30 MG 24 hr tablet Take 1 tablet (30 mg total) by mouth daily. 90 tablet 1   JARDIANCE  25 MG TABS tablet Take 25 mg by mouth daily.     Multiple Vitamin (MULTIVITAMIN) capsule Take 1 capsule by mouth daily.     nitroGLYCERIN  (NITROSTAT ) 0.4 MG SL tablet Place 1 tablet (0.4 mg total) under the tongue every 5 (five) minutes as needed for chest pain. 30 tablet 2   prednisoLONE acetate (PRED FORTE) 1 % ophthalmic suspension Place 1 drop into the left eye 4 (four) times daily.     sacubitril -valsartan  (ENTRESTO ) 24-26 MG Take 1 tablet by mouth 2 (two) times daily. 180 tablet 1   No current facility-administered medications for this visit.   Facility-Administered Medications Ordered in Other Visits  Medication Dose Route Frequency Provider Last Rate Last Admin   sodium chloride  flush (NS) 0.9 % injection 3 mL  3 mL Intravenous Q12H Fernand Denyse LABOR, MD        Allergies:   Patient has no known allergies.    Social History:   reports that he quit smoking about 41 years ago. His smoking use included cigarettes. He has never used smokeless tobacco. He reports that he does not currently use alcohol. He reports that he does not use drugs.   Family History:  family history includes Seizures in his brother.     ROS:     Review of Systems  Constitutional: Negative.   HENT: Negative.    Eyes: Negative.   Respiratory: Negative.    Gastrointestinal: Negative.   Genitourinary: Negative.   Musculoskeletal: Negative.   Skin: Negative.   Neurological: Negative.   Endo/Heme/Allergies: Negative.   Psychiatric/Behavioral: Negative.    All other systems reviewed and are negative.     All other systems are reviewed and negative.    PHYSICAL EXAM: VS:  BP 122/62   Pulse 85   Ht 5' 3 (1.6 m)   Wt 135 lb (61.2 kg)   SpO2 95%   BMI 23.91 kg/m  , BMI Body mass index is 23.91 kg/m. Last weight:  Wt Readings from Last 3 Encounters:  01/25/24 135 lb (61.2 kg)  01/18/24 123 lb 14.4 oz (56.2 kg)  01/17/24 134 lb 4.2 oz (60.9 kg)     Physical Exam Vitals reviewed.  Constitutional:      Appearance: Normal appearance. He is normal weight.  HENT:     Head: Normocephalic.     Nose: Nose normal.     Mouth/Throat:     Mouth: Mucous membranes are moist.  Eyes:     Pupils: Pupils are equal, round, and reactive to light.  Cardiovascular:     Rate and Rhythm: Normal rate and regular rhythm.     Pulses: Normal pulses.     Heart sounds: Normal heart sounds.  Pulmonary:     Effort: Pulmonary effort is normal.  Abdominal:     General: Abdomen is flat. Bowel sounds are normal.  Musculoskeletal:        General: Normal range of motion.     Cervical back: Normal range of motion.  Skin:    General: Skin is warm.  Neurological:     General: No focal deficit present.     Mental Status: He is alert.  Psychiatric:        Mood and Affect: Mood normal.       EKG:   Recent Labs: 01/16/2024: ALT 20; B Natriuretic Peptide 798.4 01/17/2024: Hemoglobin 14.0; Platelets 150 01/18/2024: Magnesium  1.8 01/19/2024: BUN 28; Creatinine, Ser 1.57; Potassium 4.1; Sodium 138    Lipid Panel    Component Value Date/Time   CHOL 129 01/17/2024 0310   CHOL 143 02/25/2020 1126   TRIG 65 01/17/2024 0310    HDL 37 (L) 01/17/2024 0310   HDL 31 (L) 02/25/2020 1126   CHOLHDL 3.5 01/17/2024 0310   VLDL 13 01/17/2024 0310   LDLCALC 79 01/17/2024 0310   LDLCALC 78 02/25/2020 1126      Other studies Reviewed: Additional studies/ records that were reviewed today include:  Review of the above records demonstrates:       No data to display            ASSESSMENT AND PLAN:    ICD-10-CM   1. Syncope, unspecified syncope type  R55 MYOCARDIAL PERFUSION IMAGING    CARDIAC EVENT MONITOR    US  Carotid Bilateral   passed out will do holter. Caroted doppler.    2. Dizziness  R42 MYOCARDIAL PERFUSION IMAGING    CARDIAC EVENT MONITOR    US  Carotid Bilateral    3. SOB (shortness of breath)  R06.02 MYOCARDIAL PERFUSION IMAGING    CARDIAC EVENT MONITOR    US  Carotid Bilateral    4. Chronic combined systolic and diastolic congestive heart failure (HCC)  I50.42 MYOCARDIAL PERFUSION IMAGING    CARDIAC EVENT MONITOR    US  Carotid Bilateral    5. Paroxysmal atrial fibrillation (HCC)  I48.0 MYOCARDIAL PERFUSION IMAGING    CARDIAC EVENT MONITOR    US  Carotid Bilateral    6. Coronary artery disease with hx of myocardial infarct w/o hx of CABG  I25.10 MYOCARDIAL PERFUSION IMAGING   I25.2 CARDIAC EVENT MONITOR    US  Carotid Bilateral    7. Orthostatic hypotension  I95.1 MYOCARDIAL PERFUSION IMAGING    CARDIAC EVENT MONITOR    US  Carotid Bilateral    8. AKI (acute kidney injury) (HCC)  N17.9 MYOCARDIAL PERFUSION IMAGING    CARDIAC EVENT MONITOR    US  Carotid Bilateral    9. Other chest pain  R07.89 MYOCARDIAL PERFUSION IMAGING    CARDIAC EVENT MONITOR    US  Carotid Bilateral  Admitted with presyncope and chest pains, and troponins were borderline and creat was high as was dehydrated. ECHO had EF 30%. Advise stress test.       Problem List Items Addressed This Visit       Cardiovascular and Mediastinum   Coronary artery disease with hx of myocardial infarct w/o hx of CABG   Relevant  Orders   MYOCARDIAL PERFUSION IMAGING   CARDIAC EVENT MONITOR   US  Carotid Bilateral   Paroxysmal atrial fibrillation (HCC)   Relevant Orders   MYOCARDIAL PERFUSION IMAGING   CARDIAC EVENT MONITOR   US  Carotid Bilateral   Orthostatic hypotension   Relevant Orders   MYOCARDIAL PERFUSION IMAGING   CARDIAC EVENT MONITOR   US  Carotid Bilateral     Genitourinary   AKI (acute kidney injury) (HCC)   Relevant Orders   MYOCARDIAL PERFUSION IMAGING   CARDIAC EVENT MONITOR   US  Carotid Bilateral     Other   Chest pain   Relevant Orders   MYOCARDIAL PERFUSION IMAGING   CARDIAC EVENT MONITOR   US  Carotid Bilateral   Dizziness   Relevant Orders   MYOCARDIAL PERFUSION IMAGING   CARDIAC EVENT MONITOR   US  Carotid Bilateral   SOB (shortness of breath)   Relevant Orders   MYOCARDIAL PERFUSION IMAGING   CARDIAC EVENT MONITOR   US  Carotid Bilateral   Syncope - Primary   Relevant Orders   MYOCARDIAL PERFUSION IMAGING   CARDIAC EVENT MONITOR   US  Carotid Bilateral   Other Visit Diagnoses       Chronic combined systolic and diastolic congestive heart failure (HCC)       Relevant Orders   MYOCARDIAL PERFUSION IMAGING   CARDIAC EVENT MONITOR   US  Carotid Bilateral          Disposition:   Return in about 4 weeks (around 02/22/2024) for  stress test and do Holter. Also set up with [place] caroted doppler.    Total time spent: 45 minutes  Signed,  Denyse Bathe, MD  01/25/2024 11:27 AM    [facility]

## 2024-01-31 ENCOUNTER — Other Ambulatory Visit

## 2024-01-31 DIAGNOSIS — I951 Orthostatic hypotension: Secondary | ICD-10-CM

## 2024-01-31 DIAGNOSIS — I2510 Atherosclerotic heart disease of native coronary artery without angina pectoris: Secondary | ICD-10-CM

## 2024-01-31 DIAGNOSIS — I480 Paroxysmal atrial fibrillation: Secondary | ICD-10-CM

## 2024-01-31 DIAGNOSIS — N179 Acute kidney failure, unspecified: Secondary | ICD-10-CM

## 2024-01-31 DIAGNOSIS — R55 Syncope and collapse: Secondary | ICD-10-CM

## 2024-01-31 DIAGNOSIS — R0602 Shortness of breath: Secondary | ICD-10-CM

## 2024-01-31 DIAGNOSIS — R0789 Other chest pain: Secondary | ICD-10-CM

## 2024-01-31 DIAGNOSIS — I5042 Chronic combined systolic (congestive) and diastolic (congestive) heart failure: Secondary | ICD-10-CM

## 2024-01-31 DIAGNOSIS — R42 Dizziness and giddiness: Secondary | ICD-10-CM

## 2024-02-05 ENCOUNTER — Telehealth: Payer: Self-pay

## 2024-02-05 NOTE — Telephone Encounter (Signed)
 Patient called stating he doesn't know what to do with the holter monitor he can't read the papers to figure it out and doesn't remember what we told him last week when we hooked him up., should patient just return the monitor?

## 2024-02-07 ENCOUNTER — Ambulatory Visit

## 2024-02-07 NOTE — Telephone Encounter (Signed)
 Patient was in for NST today and informed how to wear it and said he wasn't sure what he was doing , monitor will be mailed back to company

## 2024-02-08 DIAGNOSIS — R42 Dizziness and giddiness: Secondary | ICD-10-CM | POA: Diagnosis not present

## 2024-02-08 DIAGNOSIS — R55 Syncope and collapse: Secondary | ICD-10-CM | POA: Diagnosis not present

## 2024-02-13 ENCOUNTER — Ambulatory Visit (INDEPENDENT_AMBULATORY_CARE_PROVIDER_SITE_OTHER)

## 2024-02-13 DIAGNOSIS — I951 Orthostatic hypotension: Secondary | ICD-10-CM

## 2024-02-13 DIAGNOSIS — R42 Dizziness and giddiness: Secondary | ICD-10-CM | POA: Diagnosis not present

## 2024-02-13 DIAGNOSIS — I2510 Atherosclerotic heart disease of native coronary artery without angina pectoris: Secondary | ICD-10-CM

## 2024-02-13 DIAGNOSIS — I5042 Chronic combined systolic (congestive) and diastolic (congestive) heart failure: Secondary | ICD-10-CM

## 2024-02-13 DIAGNOSIS — R55 Syncope and collapse: Secondary | ICD-10-CM | POA: Diagnosis not present

## 2024-02-13 DIAGNOSIS — R0602 Shortness of breath: Secondary | ICD-10-CM

## 2024-02-13 DIAGNOSIS — I480 Paroxysmal atrial fibrillation: Secondary | ICD-10-CM

## 2024-02-13 DIAGNOSIS — I252 Old myocardial infarction: Secondary | ICD-10-CM

## 2024-02-13 DIAGNOSIS — R0789 Other chest pain: Secondary | ICD-10-CM

## 2024-02-13 DIAGNOSIS — N179 Acute kidney failure, unspecified: Secondary | ICD-10-CM

## 2024-02-15 ENCOUNTER — Other Ambulatory Visit: Payer: Self-pay | Admitting: Cardiovascular Disease

## 2024-02-18 ENCOUNTER — Ambulatory Visit (INDEPENDENT_AMBULATORY_CARE_PROVIDER_SITE_OTHER): Payer: Self-pay

## 2024-02-18 DIAGNOSIS — R55 Syncope and collapse: Secondary | ICD-10-CM

## 2024-02-19 LAB — CUP PACEART REMOTE DEVICE CHECK
Date Time Interrogation Session: 20250914230622
Implantable Pulse Generator Implant Date: 20210503

## 2024-02-21 ENCOUNTER — Ambulatory Visit: Payer: Self-pay | Admitting: Cardiology

## 2024-02-25 NOTE — Progress Notes (Signed)
 Remote Loop Recorder Transmission

## 2024-02-28 ENCOUNTER — Ambulatory Visit: Admitting: Cardiovascular Disease

## 2024-02-28 NOTE — Progress Notes (Signed)
 Remote Loop Recorder Transmission

## 2024-02-29 ENCOUNTER — Encounter: Payer: Self-pay | Admitting: Cardiovascular Disease

## 2024-02-29 ENCOUNTER — Ambulatory Visit: Admitting: Cardiovascular Disease

## 2024-02-29 VITALS — BP 112/54 | HR 76 | Ht 63.0 in | Wt 138.0 lb

## 2024-02-29 DIAGNOSIS — I10 Essential (primary) hypertension: Secondary | ICD-10-CM | POA: Diagnosis not present

## 2024-02-29 DIAGNOSIS — I5042 Chronic combined systolic (congestive) and diastolic (congestive) heart failure: Secondary | ICD-10-CM | POA: Diagnosis not present

## 2024-02-29 DIAGNOSIS — I6523 Occlusion and stenosis of bilateral carotid arteries: Secondary | ICD-10-CM | POA: Diagnosis not present

## 2024-02-29 DIAGNOSIS — I214 Non-ST elevation (NSTEMI) myocardial infarction: Secondary | ICD-10-CM

## 2024-02-29 DIAGNOSIS — I249 Acute ischemic heart disease, unspecified: Secondary | ICD-10-CM | POA: Diagnosis not present

## 2024-02-29 DIAGNOSIS — E119 Type 2 diabetes mellitus without complications: Secondary | ICD-10-CM

## 2024-02-29 DIAGNOSIS — Z8679 Personal history of other diseases of the circulatory system: Secondary | ICD-10-CM

## 2024-02-29 MED ORDER — METOPROLOL SUCCINATE ER 25 MG PO TB24: 25.0000 mg | ORAL_TABLET | Freq: Every day | ORAL | 11 refills | Status: DC

## 2024-02-29 MED ORDER — CARVEDILOL 3.125 MG PO TABS: 3.1250 mg | ORAL_TABLET | Freq: Two times a day (BID) | ORAL | 11 refills | Status: DC

## 2024-02-29 NOTE — Progress Notes (Signed)
 Cardiology Office Note   Date:  02/29/2024   ID:  Thomas Moore, DOB 08/03/1955, MRN [id number]  PCP:  Lenon Layman ORN, MD  Cardiologist:  Denyse Bathe, MD      History of Present Illness: Thomas Moore is a 68 y.o. male who presents for  Chief Complaint  Patient presents with   Follow-up    Holter Results/ 4 week Follow up    Insurance sent message as not beta blockers.      Past Medical History:  Diagnosis Date   CAD (coronary artery disease) of artery bypass graft    s/p CABG x 4 in 1997   CHF (congestive heart failure) (HCC)    COPD (chronic obstructive pulmonary disease) (HCC)    Coronary artery disease    CVA (cerebral vascular accident) (HCC)    Diabetes mellitus without complication (HCC)    Dysrhythmia    GERD (gastroesophageal reflux disease)    HLD (hyperlipidemia)    Hypertension    S/P TAVR (transcatheter aortic valve replacement) 07/05/2021   with Edwards 26mm S3UR via TF approach with Dr. Wonda and Dr. Lucas   Seizures [facility])      Past Surgical History:  Procedure Laterality Date   BYPASS GRAFT ANGIOGRAPHY N/A 04/25/2021   Procedure: BYPASS GRAFT ANGIOGRAPHY;  Surgeon: Lawyer Bernardino Cough, MD;  Location: [facility] INVASIVE CV LAB;  Service: Cardiovascular;  Laterality: N/A;   CARDIAC SURGERY     CORNEAL TRANSPLANT Right    CORONARY ARTERY BYPASS GRAFT  1997   ELECTROPHYSIOLOGY STUDY N/A 09/08/2021   Procedure: ELECTROPHYSIOLOGY STUDY;  Surgeon: Cindie Ole DASEN, MD;  Location: [facility] INVASIVE CV LAB;  Service: Cardiovascular;  Laterality: N/A;   EYE SURGERY     FLEXOR TENDON REPAIR Left 07/11/2019   Procedure: FLEXOR tenolysis  REPAIR LEFT RING FINGER with tednon repair;  Surgeon: Kathlynn Sharper, MD;  Location: [facility] ORS;  Service: Orthopedics;  Laterality: Left;   INCISION AND DRAINAGE ABSCESS Left 05/08/2019   Procedure: INCISION AND DRAINAGE ABSCESS;  Surgeon: Mardee Lynwood SQUIBB, MD;  Location: [facility] ORS;  Service: Orthopedics;  Laterality: Left;    INTRAOPERATIVE TRANSTHORACIC ECHOCARDIOGRAM N/A 07/05/2021   Procedure: INTRAOPERATIVE TRANSTHORACIC ECHOCARDIOGRAM;  Surgeon: Wonda Sharper, MD;  Location: [facility] OR;  Service: Open Heart Surgery;  Laterality: N/A;   LEFT HEART CATH AND CORONARY ANGIOGRAPHY Left 06/09/2022   Procedure: LEFT HEART CATH AND CORONARY ANGIOGRAPHY;  Surgeon: Bathe Denyse LABOR, MD;  Location: [facility] INVASIVE CV LAB;  Service: Cardiovascular;  Laterality: Left;   LEFT HEART CATH AND CORONARY ANGIOGRAPHY Left 09/27/2023   Procedure: LEFT HEART CATH AND CORONARY ANGIOGRAPHY with possible coronary intervention;  Surgeon: Bathe Denyse LABOR, MD;  Location: [facility] INVASIVE CV LAB;  Service: Cardiovascular;  Laterality: Left;   LEFT HEART CATH AND CORS/GRAFTS ANGIOGRAPHY N/A 10/02/2019   Procedure: LEFT HEART CATH AND CORS/GRAFTS ANGIOGRAPHY;  Surgeon: Anner Alm ORN, MD;  Location: [facility] INVASIVE CV LAB;  Service: Cardiovascular;  Laterality: N/A;   LEFT HEART CATH AND CORS/GRAFTS ANGIOGRAPHY N/A 07/17/2022   Procedure: LEFT HEART CATH AND CORS/GRAFTS ANGIOGRAPHY and possible PCI and stent;  Surgeon: Bathe Denyse LABOR, MD;  Location: [facility] INVASIVE CV LAB;  Service: Cardiovascular;  Laterality: N/A;   LOOP RECORDER INSERTION N/A 10/06/2019   Procedure: LOOP RECORDER INSERTION;  Surgeon: Waddell Danelle ORN, MD;  Location: [facility] INVASIVE CV LAB;  Service: Cardiovascular;  Laterality: N/A;   TRANSCATHETER AORTIC VALVE REPLACEMENT, TRANSFEMORAL N/A 07/05/2021   Procedure: TRANSCATHETER AORTIC VALVE REPLACEMENT, TRANSFEMORAL;  Surgeon: Wonda Sharper, MD;  Location: [facility] OR;  Service: Open Heart Surgery;  Laterality: N/A;   TRIGGER FINGER RELEASE Left      Current Outpatient Medications  Medication Sig Dispense Refill   acetaminophen  (TYLENOL ) 650 MG CR tablet Take 1,300 mg by mouth every 8 (eight) hours as needed for pain.     amiodarone  (PACERONE ) 200 MG tablet Take 1 tablet (200 mg total) by mouth 2 (two) times daily. 180 tablet 1   apixaban   (ELIQUIS ) 5 MG TABS tablet Take 1 tablet (5 mg total) by mouth 2 (two) times daily. 180 tablet 1   atorvastatin  (LIPITOR ) 80 MG tablet Take 1 tablet (80 mg total) by mouth at bedtime. 30 tablet 0   Calcium  Citrate-Vitamin D  (CALCIUM  + D PO) Take 1 tablet by mouth 2 (two) times daily.     clopidogrel  (PLAVIX ) 75 MG tablet Take 1 tablet by mouth once daily 30 tablet 0   divalproex  (DEPAKOTE  ER) 250 MG 24 hr tablet Take 1 tablet (250 mg total) by mouth at bedtime. 30 tablet 0   fluticasone -salmeterol (ADVAIR) 100-50 MCG/ACT AEPB Inhale into the lungs.     insulin  degludec (TRESIBA) 100 UNIT/ML FlexTouch Pen Inject 10 Units into the skin daily.     isosorbide  mononitrate (IMDUR ) 30 MG 24 hr tablet Take 1 tablet (30 mg total) by mouth daily. 90 tablet 1   JARDIANCE  25 MG TABS tablet Take 25 mg by mouth daily.     Multiple Vitamin (MULTIVITAMIN) capsule Take 1 capsule by mouth daily.     nitroGLYCERIN  (NITROSTAT ) 0.4 MG SL tablet Place 1 tablet (0.4 mg total) under the tongue every 5 (five) minutes as needed for chest pain. 30 tablet 2   prednisoLONE acetate (PRED FORTE) 1 % ophthalmic suspension Place 1 drop into the left eye 4 (four) times daily.     sacubitril -valsartan  (ENTRESTO ) 24-26 MG Take 1 tablet by mouth 2 (two) times daily. 180 tablet 1   No current facility-administered medications for this visit.   Facility-Administered Medications Ordered in Other Visits  Medication Dose Route Frequency Provider Last Rate Last Admin   sodium chloride  flush (NS) 0.9 % injection 3 mL  3 mL Intravenous Q12H Fernand Denyse LABOR, MD        Allergies:   Patient has no known allergies.    Social History:   reports that he quit smoking about 41 years ago. His smoking use included cigarettes. He has never used smokeless tobacco. He reports that he does not currently use alcohol. He reports that he does not use drugs.   Family History:  family history includes Seizures in his brother.    ROS:     Review of  Systems  Constitutional: Negative.   HENT: Negative.    Eyes: Negative.   Respiratory: Negative.    Gastrointestinal: Negative.   Genitourinary: Negative.   Musculoskeletal: Negative.   Skin: Negative.   Neurological: Negative.   Endo/Heme/Allergies: Negative.   Psychiatric/Behavioral: Negative.    All other systems reviewed and are negative.     All other systems are reviewed and negative.    PHYSICAL EXAM: VS:  BP (!) 112/54   Pulse 76   Ht 5' 3 (1.6 m)   Wt 138 lb (62.6 kg)   SpO2 94%   BMI 24.45 kg/m  , BMI Body mass index is 24.45 kg/m. Last weight:  Wt Readings from Last 3 Encounters:  02/29/24 138 lb (62.6 kg)  01/25/24 135 lb (61.2 kg)  01/18/24 123 lb 14.4 oz (56.2 kg)     Physical Exam Vitals reviewed.  Constitutional:      Appearance: Normal appearance. He is normal weight.  HENT:     Head: Normocephalic.     Nose: Nose normal.     Mouth/Throat:     Mouth: Mucous membranes are moist.  Eyes:     Pupils: Pupils are equal, round, and reactive to light.  Cardiovascular:     Rate and Rhythm: Normal rate and regular rhythm.     Pulses: Normal pulses.     Heart sounds: Normal heart sounds.  Pulmonary:     Effort: Pulmonary effort is normal.  Abdominal:     General: Abdomen is flat. Bowel sounds are normal.  Musculoskeletal:        General: Normal range of motion.     Cervical back: Normal range of motion.  Skin:    General: Skin is warm.  Neurological:     General: No focal deficit present.     Mental Status: He is alert.  Psychiatric:        Mood and Affect: Mood normal.       EKG:   Recent Labs: 01/16/2024: ALT 20; B Natriuretic Peptide 798.4 01/17/2024: Hemoglobin 14.0; Platelets 150 01/18/2024: Magnesium  1.8 01/19/2024: BUN 28; Creatinine, Ser 1.57; Potassium 4.1; Sodium 138    Lipid Panel    Component Value Date/Time   CHOL 129 01/17/2024 0310   CHOL 143 02/25/2020 1126   TRIG 65 01/17/2024 0310   HDL 37 (L) 01/17/2024 0310    HDL 31 (L) 02/25/2020 1126   CHOLHDL 3.5 01/17/2024 0310   VLDL 13 01/17/2024 0310   LDLCALC 79 01/17/2024 0310   LDLCALC 78 02/25/2020 1126      Other studies Reviewed: Additional studies/ records that were reviewed today include:  Review of the above records demonstrates:       No data to display            ASSESSMENT AND PLAN:  No diagnosis found.   Problem List Items Addressed This Visit   None      Disposition:   No follow-ups on file.    Total time spent: 30 minutes  Signed,  Denyse Bathe, MD  02/29/2024 2:29 PM    [facility]

## 2024-03-03 ENCOUNTER — Ambulatory Visit: Admitting: Cardiovascular Disease

## 2024-03-12 NOTE — Progress Notes (Signed)
 Remote Loop Recorder Transmission

## 2024-03-18 ENCOUNTER — Ambulatory Visit: Attending: Orthopedic Surgery | Admitting: Occupational Therapy

## 2024-03-18 ENCOUNTER — Encounter: Payer: Self-pay | Admitting: Occupational Therapy

## 2024-03-18 DIAGNOSIS — M65321 Trigger finger, right index finger: Secondary | ICD-10-CM | POA: Insufficient documentation
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
## 2024-03-19 ENCOUNTER — Emergency Department

## 2024-03-19 ENCOUNTER — Ambulatory Visit (INDEPENDENT_AMBULATORY_CARE_PROVIDER_SITE_OTHER): Payer: Self-pay

## 2024-03-19 ENCOUNTER — Other Ambulatory Visit: Payer: Self-pay

## 2024-03-19 ENCOUNTER — Observation Stay
Admission: EM | Admit: 2024-03-19 | Discharge: 2024-03-20 | Disposition: A | Discharge status: Home / Self Care | Attending: Internal Medicine | Admitting: Internal Medicine

## 2024-03-19 DIAGNOSIS — Z794 Long term (current) use of insulin: Secondary | ICD-10-CM | POA: Insufficient documentation

## 2024-03-19 DIAGNOSIS — Z8673 Personal history of transient ischemic attack (TIA), and cerebral infarction without residual deficits: Secondary | ICD-10-CM | POA: Diagnosis not present

## 2024-03-19 DIAGNOSIS — I130 Hypertensive heart and chronic kidney disease with heart failure and stage 1 through stage 4 chronic kidney disease, or unspecified chronic kidney disease: Secondary | ICD-10-CM | POA: Insufficient documentation

## 2024-03-19 DIAGNOSIS — E785 Hyperlipidemia, unspecified: Secondary | ICD-10-CM | POA: Insufficient documentation

## 2024-03-19 DIAGNOSIS — R55 Syncope and collapse: Secondary | ICD-10-CM

## 2024-03-19 DIAGNOSIS — R42 Dizziness and giddiness: Secondary | ICD-10-CM

## 2024-03-19 DIAGNOSIS — E1129 Type 2 diabetes mellitus with other diabetic kidney complication: Secondary | ICD-10-CM | POA: Diagnosis present

## 2024-03-19 DIAGNOSIS — D696 Thrombocytopenia, unspecified: Secondary | ICD-10-CM | POA: Insufficient documentation

## 2024-03-19 DIAGNOSIS — E1122 Type 2 diabetes mellitus with diabetic chronic kidney disease: Secondary | ICD-10-CM | POA: Diagnosis not present

## 2024-03-19 DIAGNOSIS — N1831 Chronic kidney disease, stage 3a: Secondary | ICD-10-CM | POA: Insufficient documentation

## 2024-03-19 DIAGNOSIS — I5023 Acute on chronic systolic (congestive) heart failure: Secondary | ICD-10-CM | POA: Diagnosis not present

## 2024-03-19 DIAGNOSIS — Z87891 Personal history of nicotine dependence: Secondary | ICD-10-CM | POA: Diagnosis not present

## 2024-03-19 DIAGNOSIS — Z23 Encounter for immunization: Secondary | ICD-10-CM | POA: Diagnosis not present

## 2024-03-19 DIAGNOSIS — J449 Chronic obstructive pulmonary disease, unspecified: Secondary | ICD-10-CM | POA: Diagnosis not present

## 2024-03-19 DIAGNOSIS — R2681 Unsteadiness on feet: Secondary | ICD-10-CM

## 2024-03-19 DIAGNOSIS — Z79899 Other long term (current) drug therapy: Secondary | ICD-10-CM | POA: Diagnosis not present

## 2024-03-19 DIAGNOSIS — D61818 Other pancytopenia: Secondary | ICD-10-CM | POA: Insufficient documentation

## 2024-03-19 DIAGNOSIS — I509 Heart failure, unspecified: Secondary | ICD-10-CM

## 2024-03-19 DIAGNOSIS — Z951 Presence of aortocoronary bypass graft: Secondary | ICD-10-CM | POA: Diagnosis not present

## 2024-03-19 DIAGNOSIS — I10 Essential (primary) hypertension: Secondary | ICD-10-CM | POA: Diagnosis present

## 2024-03-19 DIAGNOSIS — R2689 Other abnormalities of gait and mobility: Secondary | ICD-10-CM | POA: Insufficient documentation

## 2024-03-19 DIAGNOSIS — J42 Unspecified chronic bronchitis: Secondary | ICD-10-CM

## 2024-03-19 DIAGNOSIS — I2510 Atherosclerotic heart disease of native coronary artery without angina pectoris: Secondary | ICD-10-CM | POA: Insufficient documentation

## 2024-03-19 DIAGNOSIS — R569 Unspecified convulsions: Secondary | ICD-10-CM

## 2024-03-19 DIAGNOSIS — Z86711 Personal history of pulmonary embolism: Secondary | ICD-10-CM | POA: Diagnosis not present

## 2024-03-19 DIAGNOSIS — G4089 Other seizures: Secondary | ICD-10-CM | POA: Insufficient documentation

## 2024-03-19 DIAGNOSIS — Z7901 Long term (current) use of anticoagulants: Secondary | ICD-10-CM | POA: Diagnosis not present

## 2024-03-19 DIAGNOSIS — I482 Chronic atrial fibrillation, unspecified: Secondary | ICD-10-CM | POA: Diagnosis not present

## 2024-03-19 DIAGNOSIS — R0902 Hypoxemia: Principal | ICD-10-CM

## 2024-03-19 DIAGNOSIS — I639 Cerebral infarction, unspecified: Secondary | ICD-10-CM | POA: Insufficient documentation

## 2024-03-19 LAB — URINALYSIS, ROUTINE W REFLEX MICROSCOPIC
Bacteria, UA: NONE SEEN
Bilirubin Urine: NEGATIVE
Glucose, UA: >=500 mg/dL — AB
Ketones, ur: NEGATIVE mg/dL
Leukocytes,Ua: NEGATIVE
Nitrite: NEGATIVE
Protein, ur: 30 mg/dL — AB
RBC / HPF: >50 RBC/hpf (ref 0–5)
Specific Gravity, Urine: 1.016 (ref 1.005–1.030)
Squamous Epithelial / HPF: 0 /HPF (ref 0–5)
WBC, UA: 0 WBC/hpf (ref 0–5)
pH: 6 (ref 5.0–8.0)

## 2024-03-19 LAB — BASIC METABOLIC PANEL WITH GFR
Anion gap: 12 (ref 5–15)
BUN: 28 mg/dL — ABNORMAL HIGH (ref 8–23)
CO2: 25 mmol/L (ref 22–32)
Calcium: 8.5 mg/dL — ABNORMAL LOW (ref 8.9–10.3)
Chloride: 101 mmol/L (ref 98–111)
Creatinine, Ser: 1.67 mg/dL — ABNORMAL HIGH (ref 0.61–1.24)
GFR, Estimated: 44 mL/min — ABNORMAL LOW (ref >60)
Glucose, Bld: 139 mg/dL — ABNORMAL HIGH (ref 70–99)
Potassium: 4.5 mmol/L (ref 3.5–5.1)
Sodium: 138 mmol/L (ref 135–145)

## 2024-03-19 LAB — CBC WITH DIFFERENTIAL/PLATELET
Abs Immature Granulocytes: 0.02 K/uL (ref 0.00–0.07)
Basophils Absolute: 0 K/uL (ref 0.0–0.1)
Basophils Relative: 1 %
Eosinophils Absolute: 0 K/uL (ref 0.0–0.5)
Eosinophils Relative: 1 %
HCT: 36.2 % — ABNORMAL LOW (ref 39.0–52.0)
Hemoglobin: 11.4 g/dL — ABNORMAL LOW (ref 13.0–17.0)
Immature Granulocytes: 1 %
Lymphocytes Relative: 12 %
Lymphs Abs: 0.4 K/uL — ABNORMAL LOW (ref 0.7–4.0)
MCH: 29 pg (ref 26.0–34.0)
MCHC: 31.5 g/dL (ref 30.0–36.0)
MCV: 92.1 fL (ref 80.0–100.0)
Monocytes Absolute: 0.4 K/uL (ref 0.1–1.0)
Monocytes Relative: 10 %
Neutro Abs: 2.8 K/uL (ref 1.7–7.7)
Neutrophils Relative %: 75 %
Platelets: 116 K/uL — ABNORMAL LOW (ref 150–400)
RBC: 3.93 MIL/uL — ABNORMAL LOW (ref 4.22–5.81)
RDW: 14.9 % (ref 11.5–15.5)
WBC: 3.6 K/uL — ABNORMAL LOW (ref 4.0–10.5)
nRBC: 0 % (ref 0.0–0.2)

## 2024-03-19 LAB — RETICULOCYTES
Immature Retic Fract: 14.1 % (ref 2.3–15.9)
RBC.: 4.08 MIL/uL — ABNORMAL LOW (ref 4.22–5.81)
Retic Count, Absolute: 66.5 K/uL (ref 19.0–186.0)
Retic Ct Pct: 1.6 % (ref 0.4–3.1)

## 2024-03-19 LAB — PROTIME-INR
INR: 1.2 (ref 0.8–1.2)
Prothrombin Time: 15.8 s — ABNORMAL HIGH (ref 11.4–15.2)

## 2024-03-19 LAB — TROPONIN I (HIGH SENSITIVITY): Troponin I (High Sensitivity): 15 ng/L (ref <18)

## 2024-03-19 LAB — FOLATE: Folate: >20 ng/mL (ref >5.9)

## 2024-03-19 LAB — IRON AND TIBC
Iron: 96 ug/dL (ref 45–182)
Saturation Ratios: 23 % (ref 17.9–39.5)
TIBC: 420 ug/dL (ref 250–450)
UIBC: 324 ug/dL

## 2024-03-19 LAB — FERRITIN: Ferritin: 17 ng/mL — ABNORMAL LOW (ref 24–336)

## 2024-03-19 LAB — D-DIMER, QUANTITATIVE: D-Dimer, Quant: 0.55 ug{FEU}/mL — ABNORMAL HIGH (ref 0.00–0.50)

## 2024-03-19 LAB — APTT: aPTT: 36 s (ref 24–36)

## 2024-03-19 LAB — BRAIN NATRIURETIC PEPTIDE: B Natriuretic Peptide: 1635.7 pg/mL — ABNORMAL HIGH (ref 0.0–100.0)

## 2024-03-19 LAB — GLUCOSE, CAPILLARY: Glucose-Capillary: 171 mg/dL — ABNORMAL HIGH (ref 70–99)

## 2024-03-19 MED ORDER — HYDRALAZINE HCL 20 MG/ML IJ SOLN: 5.0000 mg | INTRAMUSCULAR | Status: DC | PRN

## 2024-03-19 MED ORDER — METOPROLOL SUCCINATE ER 25 MG PO TB24
25.0000 mg | ORAL_TABLET | Freq: Every day | ORAL | Status: DC
  Administered 2024-03-20: 25 mg via ORAL
  Filled 2024-03-19: qty 1

## 2024-03-19 MED ORDER — INSULIN ASPART 100 UNIT/ML IJ SOLN
0.0000 [IU] | Freq: Three times a day (TID) | INTRAMUSCULAR | Status: DC
  Administered 2024-03-20: 2 [IU] via SUBCUTANEOUS
  Filled 2024-03-19: qty 1

## 2024-03-19 MED ORDER — ALBUTEROL SULFATE (2.5 MG/3ML) 0.083% IN NEBU: 3.0000 mL | INHALATION_SOLUTION | RESPIRATORY_TRACT | Status: DC | PRN

## 2024-03-19 MED ORDER — SACUBITRIL-VALSARTAN 24-26 MG PO TABS
1.0000 | ORAL_TABLET | Freq: Two times a day (BID) | ORAL | Status: DC
Start: 2024-03-19 — End: 2024-03-20
  Administered 2024-03-20 (×2): 1 via ORAL
  Filled 2024-03-19 (×3): qty 1

## 2024-03-19 MED ORDER — LORAZEPAM 2 MG/ML IJ SOLN: 2.0000 mg | INTRAMUSCULAR | Status: DC | PRN

## 2024-03-19 MED ORDER — FUROSEMIDE 10 MG/ML IJ SOLN
40.0000 mg | Freq: Once | INTRAMUSCULAR | Status: AC
  Administered 2024-03-19: 40 mg via INTRAVENOUS
  Filled 2024-03-19: qty 4

## 2024-03-19 MED ORDER — ONDANSETRON HCL 4 MG/2ML IJ SOLN: 4.0000 mg | Freq: Three times a day (TID) | INTRAMUSCULAR | Status: DC | PRN

## 2024-03-19 MED ORDER — INSULIN GLARGINE 100 UNIT/ML ~~LOC~~ SOLN
7.0000 [IU] | Freq: Every day | SUBCUTANEOUS | Status: DC
  Administered 2024-03-20: 7 [IU] via SUBCUTANEOUS
  Filled 2024-03-19: qty 0.07

## 2024-03-19 MED ORDER — ISOSORBIDE MONONITRATE ER 30 MG PO TB24
30.0000 mg | ORAL_TABLET | Freq: Every day | ORAL | Status: DC
  Administered 2024-03-20: 30 mg via ORAL
  Filled 2024-03-19: qty 1

## 2024-03-19 MED ORDER — CLOPIDOGREL BISULFATE 75 MG PO TABS
75.0000 mg | ORAL_TABLET | Freq: Every day | ORAL | Status: DC
  Administered 2024-03-20: 75 mg via ORAL
  Filled 2024-03-19: qty 1

## 2024-03-19 MED ORDER — DM-GUAIFENESIN ER 30-600 MG PO TB12: 1.0000 | ORAL_TABLET | Freq: Two times a day (BID) | ORAL | Status: DC | PRN

## 2024-03-19 MED ORDER — STROKE: EARLY STAGES OF RECOVERY BOOK: Freq: Once | Status: AC

## 2024-03-19 MED ORDER — INSULIN ASPART 100 UNIT/ML IJ SOLN: 0.0000 [IU] | Freq: Every day | INTRAMUSCULAR | Status: DC

## 2024-03-19 MED ORDER — INFLUENZA VAC SPLIT HIGH-DOSE 0.5 ML IM SUSY
0.5000 mL | PREFILLED_SYRINGE | INTRAMUSCULAR | Status: AC
  Administered 2024-03-20: 0.5 mL via INTRAMUSCULAR
  Filled 2024-03-19: qty 0.5

## 2024-03-19 MED ORDER — APIXABAN 5 MG PO TABS
5.0000 mg | ORAL_TABLET | Freq: Two times a day (BID) | ORAL | Status: DC
Start: 2024-03-19 — End: 2024-03-20
  Administered 2024-03-19 – 2024-03-20 (×2): 5 mg via ORAL
  Filled 2024-03-19 (×2): qty 1

## 2024-03-19 MED ORDER — ATORVASTATIN CALCIUM 20 MG PO TABS
80.0000 mg | ORAL_TABLET | Freq: Every day | ORAL | Status: DC
  Administered 2024-03-19: 80 mg via ORAL
  Filled 2024-03-19: qty 4

## 2024-03-19 MED ORDER — DIVALPROEX SODIUM ER 250 MG PO TB24
250.0000 mg | ORAL_TABLET | Freq: Every day | ORAL | Status: DC
  Administered 2024-03-20: 250 mg via ORAL
  Filled 2024-03-19 (×2): qty 1

## 2024-03-19 MED ORDER — AMIODARONE HCL 200 MG PO TABS
200.0000 mg | ORAL_TABLET | Freq: Two times a day (BID) | ORAL | Status: DC
  Administered 2024-03-19 – 2024-03-20 (×2): 200 mg via ORAL
  Filled 2024-03-19 (×2): qty 1

## 2024-03-19 MED ORDER — FUROSEMIDE 10 MG/ML IJ SOLN: 40.0000 mg | Freq: Two times a day (BID) | INTRAMUSCULAR | Status: DC

## 2024-03-19 MED ORDER — NITROGLYCERIN 0.4 MG SL SUBL: 0.4000 mg | SUBLINGUAL_TABLET | SUBLINGUAL | Status: DC | PRN

## 2024-03-19 NOTE — ED Notes (Signed)
 Fall risk bracelet on, shoes are on, bed alarm is on, and pt has call bell near him

## 2024-03-19 NOTE — ED Notes (Signed)
 CCMD was called by this RN to initiate cardiac monitoring

## 2024-03-19 NOTE — ED Notes (Signed)
 Called Code stroke to Care link per Dr Suzanne spoke with Nataya at 1:41pm

## 2024-03-19 NOTE — Progress Notes (Signed)
   03/19/24 1345  Spiritual Encounters  Type of Visit Attempt (pt unavailable)  Care provided to: Pt not available  Reason for visit Code  OnCall Visit Yes   Chaplain responded to a Code Stroke and found the patient was in CT.  Chaplain checked with Nurse in the lobby to see if family was present and no one was there.  Chaplain will follow-up as needed requested from patient and/or staff.  Rev. Rana M. Nicholaus, M.Div. Chaplain Resident [facility]

## 2024-03-19 NOTE — ED Notes (Signed)
 Called Code stroke at 1:41pm

## 2024-03-19 NOTE — Consult Note (Addendum)
 NEUROLOGY CONSULT NOTE   Date of service: March 19, 2024 Patient Name: Thomas Moore MRN:  [id number] DOB:  Mar 10, 1956 Chief Complaint: Acute onset of dizziness Requesting Provider: Suzanne Kirsch, MD  History of Present Illness  Thomas Moore is a 68 y.o. male with a PMHx of CAD s/p CABG, CHF, COPD, stroke, DM, HLD, HTN and TAVR who presents with acute onset of dizziness and gait instability, feeling off-balance. He went for a walk this morning and sat down; when he got up to go home he was very shaky with the above symptoms. He stated that when he walked he felt weak. Had a golf cart and a friend take him home and then to the emergency department. Denied any N/V, chest pain or SOB. Dizziness resolves with rest and returns with standing and attempting to ambulate.   At baseline he ambulates with a walker. Home medications include Eliquis  and Depakote .   LKW: 1000 Modified rankin score: 1-No significant post stroke disability and can perform usual duties with stroke symptoms IV Thrombolysis: No: NIHSS 0 with symptoms too mild to treat.  EVT: No: Presentation is not consistent with LVO  NIHSS components Score: Comment  1a Level of Conscious 0[x]  1[]  2[]  3[]      1b LOC Questions 0[x]  1[]  2[]       1c LOC Commands 0[x]  1[]  2[]       2 Best Gaze 0[x]  1[]  2[]       3 Visual 0[x]  1[]  2[]  3[]      4 Facial Palsy 0[x]  1[]  2[]  3[]      5a Motor Arm - left 0[x]  1[]  2[]  3[]  4[]  UN[]    5b Motor Arm - Right 0[x]  1[]  2[]  3[]  4[]  UN[]    6a Motor Leg - Left 0[x]  1[]  2[]  3[]  4[]  UN[]    6b Motor Leg - Right 0[x]  1[]  2[]  3[]  4[]  UN[]    7 Limb Ataxia 0[x]  1[]  2[]  UN[]      8 Sensory 0[x]  1[]  2[]  UN[]      9 Best Language 0[x]  1[]  2[]  3[]      10 Dysarthria 0[x]  1[]  2[]  UN[]      11 Extinct. and Inattention 0[x]  1[]  2[]       TOTAL:   0      ROS  Comprehensive ROS performed and pertinent positives documented in HPI    Past History   Past Medical History:  Diagnosis Date   CAD (coronary artery  disease) of artery bypass graft    s/p CABG x 4 in 1997   CHF (congestive heart failure) (HCC)    COPD (chronic obstructive pulmonary disease) (HCC)    Coronary artery disease    CVA (cerebral vascular accident) (HCC)    Diabetes mellitus without complication (HCC)    Dysrhythmia    GERD (gastroesophageal reflux disease)    HLD (hyperlipidemia)    Hypertension    S/P TAVR (transcatheter aortic valve replacement) 07/05/2021   with Edwards 26mm S3UR via TF approach with Dr. Wonda and Dr. Lucas   Seizures [facility])     Past Surgical History:  Procedure Laterality Date   BYPASS GRAFT ANGIOGRAPHY N/A 04/25/2021   Procedure: BYPASS GRAFT ANGIOGRAPHY;  Surgeon: Lawyer Bernardino Cough, MD;  Location: [facility] INVASIVE CV LAB;  Service: Cardiovascular;  Laterality: N/A;   CARDIAC SURGERY     CORNEAL TRANSPLANT Right    CORONARY ARTERY BYPASS GRAFT  1997   ELECTROPHYSIOLOGY STUDY N/A 09/08/2021   Procedure: ELECTROPHYSIOLOGY STUDY;  Surgeon: Cindie Ole DASEN, MD;  Location: [facility] INVASIVE CV  LAB;  Service: Cardiovascular;  Laterality: N/A;   EYE SURGERY     FLEXOR TENDON REPAIR Left 07/11/2019   Procedure: FLEXOR tenolysis  REPAIR LEFT RING FINGER with tednon repair;  Surgeon: Kathlynn Sharper, MD;  Location: [facility] ORS;  Service: Orthopedics;  Laterality: Left;   INCISION AND DRAINAGE ABSCESS Left 05/08/2019   Procedure: INCISION AND DRAINAGE ABSCESS;  Surgeon: Mardee Lynwood SQUIBB, MD;  Location: [facility] ORS;  Service: Orthopedics;  Laterality: Left;   INTRAOPERATIVE TRANSTHORACIC ECHOCARDIOGRAM N/A 07/05/2021   Procedure: INTRAOPERATIVE TRANSTHORACIC ECHOCARDIOGRAM;  Surgeon: Wonda Sharper, MD;  Location: [facility] OR;  Service: Open Heart Surgery;  Laterality: N/A;   LEFT HEART CATH AND CORONARY ANGIOGRAPHY Left 06/09/2022   Procedure: LEFT HEART CATH AND CORONARY ANGIOGRAPHY;  Surgeon: Fernand Denyse LABOR, MD;  Location: [facility] INVASIVE CV LAB;  Service: Cardiovascular;  Laterality: Left;   LEFT HEART CATH AND CORONARY  ANGIOGRAPHY Left 09/27/2023   Procedure: LEFT HEART CATH AND CORONARY ANGIOGRAPHY with possible coronary intervention;  Surgeon: Fernand Denyse LABOR, MD;  Location: [facility] INVASIVE CV LAB;  Service: Cardiovascular;  Laterality: Left;   LEFT HEART CATH AND CORS/GRAFTS ANGIOGRAPHY N/A 10/02/2019   Procedure: LEFT HEART CATH AND CORS/GRAFTS ANGIOGRAPHY;  Surgeon: Anner Alm ORN, MD;  Location: [facility] INVASIVE CV LAB;  Service: Cardiovascular;  Laterality: N/A;   LEFT HEART CATH AND CORS/GRAFTS ANGIOGRAPHY N/A 07/17/2022   Procedure: LEFT HEART CATH AND CORS/GRAFTS ANGIOGRAPHY and possible PCI and stent;  Surgeon: Fernand Denyse LABOR, MD;  Location: [facility] INVASIVE CV LAB;  Service: Cardiovascular;  Laterality: N/A;   LOOP RECORDER INSERTION N/A 10/06/2019   Procedure: LOOP RECORDER INSERTION;  Surgeon: Waddell Danelle ORN, MD;  Location: [facility] INVASIVE CV LAB;  Service: Cardiovascular;  Laterality: N/A;   TRANSCATHETER AORTIC VALVE REPLACEMENT, TRANSFEMORAL N/A 07/05/2021   Procedure: TRANSCATHETER AORTIC VALVE REPLACEMENT, TRANSFEMORAL;  Surgeon: Wonda Sharper, MD;  Location: [facility] OR;  Service: Open Heart Surgery;  Laterality: N/A;   TRIGGER FINGER RELEASE Left     Family History: Family History  Problem Relation Age of Onset   Seizures Brother     Social History  reports that he quit smoking about 41 years ago. His smoking use included cigarettes. He has never used smokeless tobacco. He reports that he does not currently use alcohol. He reports that he does not use drugs.  No Known Allergies  Medications  No current facility-administered medications for this encounter.  Current Outpatient Medications:    acetaminophen  (TYLENOL ) 650 MG CR tablet, Take 1,300 mg by mouth every 8 (eight) hours as needed for pain., Disp: , Rfl:    amiodarone  (PACERONE ) 200 MG tablet, Take 1 tablet (200 mg total) by mouth 2 (two) times daily., Disp: 180 tablet, Rfl: 1   apixaban  (ELIQUIS ) 5 MG TABS tablet, Take 1 tablet (5 mg total)  by mouth 2 (two) times daily., Disp: 180 tablet, Rfl: 1   atorvastatin  (LIPITOR ) 80 MG tablet, Take 1 tablet (80 mg total) by mouth at bedtime., Disp: 30 tablet, Rfl: 0   Calcium  Citrate-Vitamin D  (CALCIUM  + D PO), Take 1 tablet by mouth 2 (two) times daily., Disp: , Rfl:    clopidogrel  (PLAVIX ) 75 MG tablet, Take 1 tablet by mouth once daily, Disp: 30 tablet, Rfl: 0   divalproex  (DEPAKOTE  ER) 250 MG 24 hr tablet, Take 1 tablet (250 mg total) by mouth at bedtime., Disp: 30 tablet, Rfl: 0   fluticasone -salmeterol (ADVAIR) 100-50 MCG/ACT AEPB, Inhale into the lungs., Disp: , Rfl:    insulin   degludec (TRESIBA) 100 UNIT/ML FlexTouch Pen, Inject 10 Units into the skin daily., Disp: , Rfl:    isosorbide  mononitrate (IMDUR ) 30 MG 24 hr tablet, Take 1 tablet (30 mg total) by mouth daily., Disp: 90 tablet, Rfl: 1   JARDIANCE  25 MG TABS tablet, Take 25 mg by mouth daily., Disp: , Rfl:    metoprolol  succinate (TOPROL  XL) 25 MG 24 hr tablet, Take 1 tablet (25 mg total) by mouth daily., Disp: 30 tablet, Rfl: 11   Multiple Vitamin (MULTIVITAMIN) capsule, Take 1 capsule by mouth daily., Disp: , Rfl:    nitroGLYCERIN  (NITROSTAT ) 0.4 MG SL tablet, Place 1 tablet (0.4 mg total) under the tongue every 5 (five) minutes as needed for chest pain., Disp: 30 tablet, Rfl: 2   prednisoLONE acetate (PRED FORTE) 1 % ophthalmic suspension, Place 1 drop into the left eye 4 (four) times daily., Disp: , Rfl:    sacubitril -valsartan  (ENTRESTO ) 24-26 MG, Take 1 tablet by mouth 2 (two) times daily., Disp: 180 tablet, Rfl: 1  Facility-Administered Medications Ordered in Other Encounters:    sodium chloride  flush (NS) 0.9 % injection 3 mL, 3 mL, Intravenous, Q12H, Fernand Denyse LABOR, MD  Vitals   Vitals:   03-26-2024 1141  BP: (!) 149/69  Pulse: 65  Resp: 18  Temp: 98.3 F (36.8 C)  TempSrc: Oral  SpO2: 99%    There is no height or weight on file to calculate BMI.   Physical Exam   Constitutional: Appears well-developed  and well-nourished.  Psych: Affect appropriate to situation.  Eyes: No scleral injection.  HENT: No OP obstruction.  Head: Normocephalic.  Respiratory: Effort normal, non-labored breathing.  Skin: No edema noted.   Neurologic Examination   See NIHSS  Labs/Imaging/Neurodiagnostic studies   CBC:  Recent Labs  Lab 03/26/24 1146  WBC 3.6*  NEUTROABS 2.8  HGB 11.4*  HCT 36.2*  MCV 92.1  PLT 116*   Basic Metabolic Panel:  Lab Results  Component Value Date   NA 138 03-26-2024   K 4.5 2024/03/26   CO2 25 March 26, 2024   GLUCOSE 139 (H) 2024-03-26   BUN 28 (H) Mar 26, 2024   CREATININE 1.67 (H) 2024-03-26   CALCIUM  8.5 (L) Mar 26, 2024   GFRNONAA 44 (L) 26-Mar-2024   GFRAA 76 02/25/2020   Lipid Panel:  Lab Results  Component Value Date   LDLCALC 79 01/17/2024   HgbA1c:  Lab Results  Component Value Date   HGBA1C 6.5 (H) 01/16/2024   Urine Drug Screen:     Component Value Date/Time   LABOPIA NONE DETECTED 01/17/2024 0535   COCAINSCRNUR NONE DETECTED 01/17/2024 0535   LABBENZ NONE DETECTED 01/17/2024 0535   AMPHETMU NONE DETECTED 01/17/2024 0535   THCU NONE DETECTED 01/17/2024 0535   LABBARB NONE DETECTED 01/17/2024 0535    Alcohol Level     Component Value Date/Time   ETH <10 08/08/2017 1032   INR  Lab Results  Component Value Date   INR 1.3 (H) 01/17/2024   APTT  Lab Results  Component Value Date   APTT 74 (H) 01/18/2024   TTE (08/06/23): 1. Left ventricular ejection fraction, by estimation, is 30 to 35%. The  left ventricle has moderately decreased function. The left ventricle  demonstrates regional wall motion abnormalities (see scoring  diagram/findings for description). There is mild  eccentric left ventricular hypertrophy of the septal segment. Left  ventricular diastolic parameters are consistent with Grade III diastolic  dysfunction (restrictive).   2. Right ventricular systolic function is mildly reduced.  The right  ventricular size is mildly  enlarged.   3. Left atrial size was mildly dilated.   4. Right atrial size was mildly dilated.   5. The mitral valve is grossly normal. Mild mitral valve regurgitation.   6. The aortic valve is normal in structure. Aortic valve regurgitation is  not visualized.    ASSESSMENT  68 y.o. male with a PMHx of CAD s/p CABG, CHF, COPD, stroke, seizures, DM, HLD, HTN and TAVR who presents with acute onset of dizziness and gait instability, feeling off-balance. He went for a walk this morning and sat down; when he got up to go home he was very shaky with the above symptoms. He stated that when he walked he felt weak. Had a golf cart and a friend take him home and then to the emergency department. Denied any N/V, chest pain or SOB. Dizziness resolves with rest and returns with standing and attempting to ambulate. At baseline he ambulates with a walker. Home medications include Eliquis .  - Exam reveals no focal neurological deficit. NIHSS 0.  - EKG: Sinus rhythm with 1st degree A-V block Possible Anterior infarct , age undetermined ST & T wave abnormality, consider inferior ischemia  - CT head: No acute intracranial abnormality. Encephalomalacia in the anterior left frontal lobe extending into the left superior frontal gyrus compatible with remote infarct. Small remote cortical infarct in the posterior left frontal operculum. Mild parenchymal volume loss. - Labs:  - BUN 28, Cr 1.67, eGFR 44 - BNP elevated - D-dimer elevated - HgbA1c 6.5.  - Impression: Acute onset of dizziness with gait instability. DDx includes peripheral vestibulopathy versus a small posterior fossa stroke.   RECOMMENDATIONS  1. Fasting lipid panel 2. MRI of the brain without contrast 3. PT consult, OT consult, Speech consult 4. CTA of head and neck 5. Continue his home atorvastatin  6. Continue Eliquis   7. Risk factor modification 8. Telemetry monitoring 9. Frequent neuro checks 10. NPO until passes stroke swallow screen 11.  Permissive HTN x 24 hours.  12. PO hydration 13. Orthostatics 14. Has had a recent TTE  Addendum: MRI brain: 1. No acute intracranial abnormality. 2. Chronic left ACA distribution infarct with a few additional scattered small remote cortical to subcortical infarcts involving the bilateral cerebral hemispheres. 3. Underlying moderate chronic microvascular ischemic disease, with a few scattered remote lacunar infarcts as above.  Addendum: - Discussed case with Hospitalist. Overall presentation now appears most consistent with CHF exacerbation.  - No need for CTA of head and neck at this time.  - Neurohospitalist service will sign off. Please call if there are additional questions.   ______________________________________________________________________    Thomas Moore, Thomas Winner, MD [facility]

## 2024-03-19 NOTE — ED Notes (Signed)
 Assisted patient with using the urinal. No other needs at this time. Call light is within patients reach.

## 2024-03-19 NOTE — ED Notes (Signed)
 Patient transported to MRI

## 2024-03-19 NOTE — ED Notes (Signed)
 Pt states he feels much better now than earlier today.

## 2024-03-19 NOTE — Code Documentation (Signed)
 Stroke Response Nurse Documentation Code Documentation  Thomas Moore is a 68 y.o. male arriving to [facility] via [employer] [facility] on 03/19/2024 with past medical hx of CAD, DM, seizures, CVA, CAD, HLD, TAVR in 2023, CHF. On Eliquis  (apixaban ) daily. Pt reports taking all of his medications this morning, including Eliquis . Code stroke was activated by ED.   Patient from home where he was LKW at 1000 and now complaining of dizziness, gait instability. Pt went for a walk around 0930-1000 in his normal state. He sat down on a bench because his back hurt, and when he got up, he had sudden onset dizziness and felt shaky. Brought to ED by a friend. Code stroke called in ED.   Stroke team at the bedside on patient arrival. Labs drawn and patient cleared for CT by Dr. Suzanne. Patient to CT with team. NIHSS 0, see documentation for details and code stroke times. Patient with intermittent dizziness on exam. Pt gait unstable, per EDP. The following imaging was completed: CT Head. Patient is not a candidate for IV Thrombolytic due to recent eliquis  dose, per MD. Patient is not a candidate for IR due to exam not consistent with LVO, per MD.   Care Plan: every 2 hr NIHSS and VS.   Process Delays Noted: n/a  Bedside handoff with ED RN Bernice.    Burnard KANDICE Bras  Stroke Response RN

## 2024-03-19 NOTE — Progress Notes (Signed)
 CODE STROKE- PHARMACY COMMUNICATION   Time CODE STROKE called/page received:[id number]  Time response to CODE STROKE was made (in person or via phone): Immediately  Time Stroke Kit retrieved from [employer] (only if needed): N/A, patient not a candidate for TNK due to taking Eliquis  w/ compliance endorsed  Name of Provider/Nurse contacted: Dr. Merrianne  Past Medical History:  Diagnosis Date   CAD (coronary artery disease) of artery bypass graft    s/p CABG x 4 in 1997   CHF (congestive heart failure) (HCC)    COPD (chronic obstructive pulmonary disease) (HCC)    Coronary artery disease    CVA (cerebral vascular accident) (HCC)    Diabetes mellitus without complication (HCC)    Dysrhythmia    GERD (gastroesophageal reflux disease)    HLD (hyperlipidemia)    Hypertension    S/P TAVR (transcatheter aortic valve replacement) 07/05/2021   with Edwards 26mm S3UR via TF approach with Dr. Wonda and Dr. Lucas   Seizures [facility])    Thomas Moore  03/19/2024  2:08 PM

## 2024-03-19 NOTE — ED Provider Notes (Signed)
 [facility] Provider Note    Event Date/Time   First MD Initiated Contact with Patient 03/19/24 1346     (approximate)   History   Shaky   HPI  Thomas Moore is a 68 y.o. male past medical history significant for prior CVA, CAD with prior CABG, Eliquis  use, who presents to the emergency department for an episode of dizziness and trouble with his gait.  Patient states that he was on a walk around his neighborhood and went to sit down.  Around 10 to 11:00 AM but then went to stand up and was feeling very dizzy and off balance.  Was unable to get home or complete his walk secondary to not feeling safe walking.  Had a golf cart and a friend take him home and then to the emergency department.  States that he does not have any significant dizziness at rest but when he goes to stand or walk he feels off balance still.  Does have a prior history of stroke but does not have any residual deficits.  Does have a history of seizure disorder.  Denies recent falls or head trauma.  Does take Eliquis  and has been compliant with his medication.  Denies any significant chest pain or shortness of breath.  Denies any nausea or vomiting.  Normally ambulates with a walker.     Physical Exam   Triage Vital Signs: ED Triage Vitals  Encounter Vitals Group     BP 03/19/24 1141 (!) 149/69     Girls Systolic BP Percentile --      Girls Diastolic BP Percentile --      Boys Systolic BP Percentile --      Boys Diastolic BP Percentile --      Pulse Rate 03/19/24 1141 65     Resp 03/19/24 1141 18     Temp 03/19/24 1141 98.3 F (36.8 C)     Temp Source 03/19/24 1141 Oral     SpO2 03/19/24 1141 99 %     Weight --      Height --      Head Circumference --      Peak Flow --      Pain Score 03/19/24 1142 0     Pain Loc --      Pain Education --      Exclude from Growth Chart --     Most recent vital signs: Vitals:   03/19/24 1530 03/19/24 1630  BP: (!) 158/74 (!) 142/62  Pulse:  63 62  Resp: 19 (!) 21  Temp:    SpO2: 97% 98%    Physical Exam Constitutional:      Appearance: He is well-developed.  HENT:     Head: Atraumatic.  Eyes:     Extraocular Movements: Extraocular movements intact.     Conjunctiva/sclera: Conjunctivae normal.     Pupils: Pupils are equal, round, and reactive to light.     Comments: No nystagmus  Cardiovascular:     Rate and Rhythm: Regular rhythm.  Pulmonary:     Effort: No respiratory distress.  Abdominal:     Tenderness: There is no abdominal tenderness.  Musculoskeletal:     Cervical back: Normal range of motion.  Skin:    General: Skin is warm.  Neurological:     Mental Status: He is alert. Mental status is at baseline.     GCS: GCS eye subscore is 4. GCS verbal subscore is 5. GCS motor subscore is 6.  Sensory: Sensation is intact.     Motor: Motor function is intact.     Coordination: Coordination is intact.     Gait: Gait abnormal.     Comments: Mild questionable facial droop to the right lower face.     IMPRESSION / MDM / ASSESSMENT AND PLAN / ED COURSE  I reviewed the triage vital signs and the nursing notes.  Patient was last known well 10 AM.  Activated code stroke given last known well 10 AM.  Patient immediately taken back to CT scan.  Patient is on anticoagulation so not a TNK candidate.  VAN negative.  Patient was immediately evaluated by stroke neurology with Dr. Merrianne  EKG  I, Clotilda Punter, the attending physician, personally viewed and interpreted this ECG.  ST depression in the inferior leads that looks like it has been present on prior EKGs.  No significant ST elevation.  No findings of acute ischemia or dysrhythmia.  No tachycardic or bradycardic dysrhythmias while on cardiac telemetry.  RADIOLOGY I independently reviewed imaging, my interpretation of imaging: CT scan of the head with no signs of intracranial hemorrhage  Chest x-ray with concerning findings for pulmonary edema  MRI of  the brain with no findings of acute stroke  LABS (all labs ordered are listed, but only abnormal results are displayed) Labs interpreted as -    Labs Reviewed  CBC WITH DIFFERENTIAL/PLATELET - Abnormal; Notable for the following components:      Result Value   WBC 3.6 (*)    RBC 3.93 (*)    Hemoglobin 11.4 (*)    HCT 36.2 (*)    Platelets 116 (*)    Lymphs Abs 0.4 (*)    All other components within normal limits  BASIC METABOLIC PANEL WITH GFR - Abnormal; Notable for the following components:   Glucose, Bld 139 (*)    BUN 28 (*)    Creatinine, Ser 1.67 (*)    Calcium  8.5 (*)    GFR, Estimated 44 (*)    All other components within normal limits  URINALYSIS, ROUTINE W REFLEX MICROSCOPIC - Abnormal; Notable for the following components:   Color, Urine YELLOW (*)    APPearance CLEAR (*)    Glucose, UA >=500 (*)    Hgb urine dipstick LARGE (*)    Protein, ur 30 (*)    All other components within normal limits  PROTIME-INR - Abnormal; Notable for the following components:   Prothrombin Time 15.8 (*)    All other components within normal limits  D-DIMER, QUANTITATIVE - Abnormal; Notable for the following components:   D-Dimer, Quant 0.55 (*)    All other components within normal limits  BRAIN NATRIURETIC PEPTIDE - Abnormal; Notable for the following components:   B Natriuretic Peptide 1,635.7 (*)    All other components within normal limits  APTT  BASIC METABOLIC PANEL WITH GFR  CBC  MAGNESIUM   TROPONIN I (HIGH SENSITIVITY)     MDM    Activated code stroke.  Not a TNK candidate.  While in the emergency department patient became hypoxic with an oxygen saturation of 86%.  Requiring 2 L nasal cannula.  Chest x-ray with concerns for pulmonary edema and does have an elevated BNP.  On chart review patient was discontinued from his Lasix  during his last hospitalizations and has an EF of 35%.  Will restart on a dose of IV Lasix .  D-dimer obtained, low risk Wells criteria will,  age-adjusted was negative do not feel that CTA is  necessary at this time.  Consulted hospitalist for admission for acute hypoxic respiratory failure in the setting of heart failure exacerbation.   PROCEDURES:  Critical Care performed: yes  .Critical Care  Performed by: Suzanne Kirsch, MD Authorized by: Suzanne Kirsch, MD   Critical care provider statement:    Critical care time (minutes):  30   Critical care time was exclusive of:  Separately billable procedures and treating other patients   Critical care was necessary to treat or prevent imminent or life-threatening deterioration of the following conditions:  CNS failure or compromise   Critical care was time spent personally by me on the following activities:  Development of treatment plan with patient or surrogate, discussions with consultants, evaluation of patient's response to treatment, examination of patient, ordering and review of laboratory studies, ordering and review of radiographic studies, ordering and performing treatments and interventions, pulse oximetry, re-evaluation of patient's condition and review of old charts   Care discussed with: admitting provider     Patient's presentation is most consistent with acute presentation with potential threat to life or bodily function.   MEDICATIONS ORDERED IN ED: Medications  albuterol  (PROVENTIL ) (2.5 MG/3ML) 0.083% nebulizer solution 3 mL (has no administration in time range)  dextromethorphan -guaiFENesin  (MUCINEX  DM) 30-600 MG per 12 hr tablet 1 tablet (has no administration in time range)  ondansetron  (ZOFRAN ) injection 4 mg (has no administration in time range)  insulin  aspart (novoLOG ) injection 0-9 Units (has no administration in time range)  insulin  aspart (novoLOG ) injection 0-5 Units (has no administration in time range)   stroke: early stages of recovery book (has no administration in time range)  furosemide  (LASIX ) injection 40 mg (40 mg Intravenous Given 03/19/24  1828)    FINAL CLINICAL IMPRESSION(S) / ED DIAGNOSES   Final diagnoses:  Hypoxia  Acute on chronic congestive heart failure, unspecified heart failure type (HCC)  Gait instability     Rx / DC Orders   ED Discharge Orders     None        Note:  This document was prepared using Dragon voice recognition software and may include unintentional dictation errors.   Suzanne Kirsch, MD 03/19/24 [phone]

## 2024-03-19 NOTE — H&P (Addendum)
 History and Physical    MINOR IDEN FMW:[id number] DOB: 02-16-1956 DOA: 03/19/2024  Referring MD/NP/PA:   PCP: Lenon Layman ORN, MD   Patient coming from:  The patient is coming from home.     Chief Complaint: Dizziness and mild SOB  HPI: Thomas Moore is a 68 y.o. male with medical history significant of sCHF with EF 30-35%, stroke,  A fib and PE on Eliquis , CAD, s/p of CABG 1997, HTN, HLD, DM, stroke, CKD-3a, SVT, thrombocytopenia, aortic stenosis (s/p of TAVR), bilateral carotid artery stenosis, seizure, chronic back pain, kidney stone (s/p of ESWL), loop recorder, thrombocytopenia, who presents with dizziness and mild SOB.  Pt was initially brought into ED due to dizziness and gait instability.  Patient was last known normal at about 10 AM. Pt suddenly started feeling dizzy with poor balance.  His dizziness is worse when standing up and better on resting.  No unilateral numbness or tingling in extremities.  No facial droop or slurred speech.  Patient also reports mild SOB, no chest pain, cough, fever or chills.  Patient was found to have oxygen desaturation to 86% on room air, which improved with 2 L oxygen in ED.  Patient does not use oxygen at baseline.  No nausea, vomiting, diarrhea or abdominal pain.  No symptoms of UTI.  Denies rectal bleeding or dark stool.  Code stroke was activated.  CT of the head is negative.  MRI for brain negative for acute stroke.    Data reviewed independently and ED Course: pt was found to have pancytopenia with BNP1635, WBC 3.6, hemoglobin 11.4, platelets 116 (WBC 5.9, hemoglobin 14.0, platelet 150 on 01/17/24), troponin 15, D-dimer 0.55, INR 1.2, PTT 36.  Chest x-ray showed cardiomegaly with pulmonary edema.  CT of head negative for acute intracranial abnormalities.  MRI of brain negative for acute stroke. Pt is admitted to telemetry bed as inpatient.  Dr. Merrianne of neurology is consulted.  MRI-brain: 1. No acute intracranial abnormality. 2.  Chronic left ACA distribution infarct with a few additional scattered small remote cortical to subcortical infarcts involving the bilateral cerebral hemispheres. 3. Underlying moderate chronic microvascular ischemic disease, with a few scattered remote lacunar infarcts as above.   EKG: I have personally reviewed.  Sinus rhythm, QTc 439, first-degree AV block, mild ST depression in V5-V6 and the inferior leads   Review of Systems:   General: no fevers, chills, no body weight gain, has fatigue HEENT: no blurry vision, hearing changes or sore throat Respiratory: no dyspnea, coughing, wheezing CV: no chest pain, no palpitations GI: no nausea, vomiting, abdominal pain, diarrhea, constipation GU: no dysuria, burning on urination, increased urinary frequency, hematuria  Ext: has trace leg edema Neuro: no unilateral weakness, numbness, or tingling, no vision change or hearing loss.  Dizziness, poor balance Skin: no skin tear. MSK: No muscle spasm, no deformity, no limitation of range of movement in spin Heme: No easy bruising.  Travel history: No recent long distant travel.   Allergy: No Known Allergies  Past Medical History:  Diagnosis Date   CAD (coronary artery disease) of artery bypass graft    s/p CABG x 4 in 1997   CHF (congestive heart failure) (HCC)    COPD (chronic obstructive pulmonary disease) (HCC)    Coronary artery disease    CVA (cerebral vascular accident) (HCC)    Diabetes mellitus without complication (HCC)    Dysrhythmia    GERD (gastroesophageal reflux disease)    HLD (hyperlipidemia)    Hypertension  S/P TAVR (transcatheter aortic valve replacement) 07/05/2021   with Edwards 26mm S3UR via TF approach with Dr. Wonda and Dr. Lucas   Seizures [facility])     Past Surgical History:  Procedure Laterality Date   BYPASS GRAFT ANGIOGRAPHY N/A 04/25/2021   Procedure: BYPASS GRAFT ANGIOGRAPHY;  Surgeon: Lawyer Bernardino Cough, MD;  Location: [facility] INVASIVE CV LAB;   Service: Cardiovascular;  Laterality: N/A;   CARDIAC SURGERY     CORNEAL TRANSPLANT Right    CORONARY ARTERY BYPASS GRAFT  1997   ELECTROPHYSIOLOGY STUDY N/A 09/08/2021   Procedure: ELECTROPHYSIOLOGY STUDY;  Surgeon: Cindie Ole DASEN, MD;  Location: [facility] INVASIVE CV LAB;  Service: Cardiovascular;  Laterality: N/A;   EYE SURGERY     FLEXOR TENDON REPAIR Left 07/11/2019   Procedure: FLEXOR tenolysis  REPAIR LEFT RING FINGER with tednon repair;  Surgeon: Kathlynn Sharper, MD;  Location: [facility] ORS;  Service: Orthopedics;  Laterality: Left;   INCISION AND DRAINAGE ABSCESS Left 05/08/2019   Procedure: INCISION AND DRAINAGE ABSCESS;  Surgeon: Mardee Lynwood SQUIBB, MD;  Location: [facility] ORS;  Service: Orthopedics;  Laterality: Left;   INTRAOPERATIVE TRANSTHORACIC ECHOCARDIOGRAM N/A 07/05/2021   Procedure: INTRAOPERATIVE TRANSTHORACIC ECHOCARDIOGRAM;  Surgeon: Wonda Sharper, MD;  Location: [facility] OR;  Service: Open Heart Surgery;  Laterality: N/A;   LEFT HEART CATH AND CORONARY ANGIOGRAPHY Left 06/09/2022   Procedure: LEFT HEART CATH AND CORONARY ANGIOGRAPHY;  Surgeon: Fernand Denyse LABOR, MD;  Location: [facility] INVASIVE CV LAB;  Service: Cardiovascular;  Laterality: Left;   LEFT HEART CATH AND CORONARY ANGIOGRAPHY Left 09/27/2023   Procedure: LEFT HEART CATH AND CORONARY ANGIOGRAPHY with possible coronary intervention;  Surgeon: Fernand Denyse LABOR, MD;  Location: [facility] INVASIVE CV LAB;  Service: Cardiovascular;  Laterality: Left;   LEFT HEART CATH AND CORS/GRAFTS ANGIOGRAPHY N/A 10/02/2019   Procedure: LEFT HEART CATH AND CORS/GRAFTS ANGIOGRAPHY;  Surgeon: Anner Alm ORN, MD;  Location: [facility] INVASIVE CV LAB;  Service: Cardiovascular;  Laterality: N/A;   LEFT HEART CATH AND CORS/GRAFTS ANGIOGRAPHY N/A 07/17/2022   Procedure: LEFT HEART CATH AND CORS/GRAFTS ANGIOGRAPHY and possible PCI and stent;  Surgeon: Fernand Denyse LABOR, MD;  Location: [facility] INVASIVE CV LAB;  Service: Cardiovascular;  Laterality: N/A;   LOOP RECORDER INSERTION N/A  10/06/2019   Procedure: LOOP RECORDER INSERTION;  Surgeon: Waddell Danelle ORN, MD;  Location: [facility] INVASIVE CV LAB;  Service: Cardiovascular;  Laterality: N/A;   TRANSCATHETER AORTIC VALVE REPLACEMENT, TRANSFEMORAL N/A 07/05/2021   Procedure: TRANSCATHETER AORTIC VALVE REPLACEMENT, TRANSFEMORAL;  Surgeon: Wonda Sharper, MD;  Location: [facility] OR;  Service: Open Heart Surgery;  Laterality: N/A;   TRIGGER FINGER RELEASE Left     Social History:  reports that he quit smoking about 41 years ago. His smoking use included cigarettes. He has never used smokeless tobacco. He reports that he does not currently use alcohol. He reports that he does not use drugs.  Family History:  Family History  Problem Relation Age of Onset   Seizures Brother      Prior to Admission medications   Medication Sig Start Date End Date Taking? Authorizing Provider  acetaminophen  (TYLENOL ) 650 MG CR tablet Take 1,300 mg by mouth every 8 (eight) hours as needed for pain.    [provider]  amiodarone  (PACERONE ) 200 MG tablet Take 1 tablet (200 mg total) by mouth 2 (two) times daily. 01/22/24   Fernand Denyse LABOR, MD  apixaban  (ELIQUIS ) 5 MG TABS tablet Take 1 tablet (5 mg total) by mouth 2 (two) times daily. 11/14/22  Riddle, Suzann, NP  atorvastatin  (LIPITOR ) 80 MG tablet Take 1 tablet (80 mg total) by mouth at bedtime. 08/06/23   Barbarann Nest, MD  Calcium  Citrate-Vitamin D  (CALCIUM  + D PO) Take 1 tablet by mouth 2 (two) times daily.    [provider]  clopidogrel  (PLAVIX ) 75 MG tablet Take 1 tablet by mouth once daily 02/15/24   Fernand Denyse LABOR, MD  divalproex  (DEPAKOTE  ER) 250 MG 24 hr tablet Take 1 tablet (250 mg total) by mouth at bedtime. 08/06/23   Barbarann Nest, MD  fluticasone -salmeterol (ADVAIR) 100-50 MCG/ACT AEPB Inhale into the lungs. 06/25/23   [provider]  insulin  degludec (TRESIBA) 100 UNIT/ML FlexTouch Pen Inject 10 Units into the skin daily. 07/11/23   [provider]  isosorbide   mononitrate (IMDUR ) 30 MG 24 hr tablet Take 1 tablet (30 mg total) by mouth daily. 01/22/24   Fernand Denyse LABOR, MD  JARDIANCE  25 MG TABS tablet Take 25 mg by mouth daily.    [provider]  metoprolol  succinate (TOPROL  XL) 25 MG 24 hr tablet Take 1 tablet (25 mg total) by mouth daily. 02/29/24 02/28/25  Fernand Denyse LABOR, MD  Multiple Vitamin (MULTIVITAMIN) capsule Take 1 capsule by mouth daily.    [provider]  nitroGLYCERIN  (NITROSTAT ) 0.4 MG SL tablet Place 1 tablet (0.4 mg total) under the tongue every 5 (five) minutes as needed for chest pain. 08/03/23   Scoggins, Amber, NP  prednisoLONE acetate (PRED FORTE) 1 % ophthalmic suspension Place 1 drop into the left eye 4 (four) times daily. 01/16/24   [provider]  sacubitril -valsartan  (ENTRESTO ) 24-26 MG Take 1 tablet by mouth 2 (two) times daily. 01/22/24   Fernand Denyse LABOR, MD    Physical Exam: Vitals:   03/19/24 1528 03/19/24 1530 03/19/24 1630 03/19/24 2011  BP:  (!) 158/74 (!) 142/62 (!) 161/76  Pulse:  63 62 98  Resp:  19 (!) 21 (!) 22  Temp: 98.4 F (36.9 C)   (!) 97 F (36.1 C)  TempSrc: Oral   Oral  SpO2:  97% 98% 90%  Weight:    61.5 kg  Height:    5' 3 (1.6 m)   General: Not in acute distress HEENT:       Eyes: PERRL, EOMI, no jaundice       ENT: No discharge from the ears and nose, no pharynx injection, no tonsillar enlargement.        Neck: positive JVD, no bruit, no mass felt. Heme: No neck lymph node enlargement. Cardiac: S1/S2, RRR, No gallops or rubs. Respiratory: has fine crackles bilaterally GI: Soft, nondistended, nontender, no rebound pain, no organomegaly, BS present. GU: No hematuria Ext: has trace pitting leg edema bilaterally. 1+DP/PT pulse bilaterally. Musculoskeletal: No joint deformities, No joint redness or warmth, no limitation of ROM in spin. Skin: No rashes.  Neuro: Alert, oriented X3, cranial nerves II-XII grossly intact, moves all extremities normally.  Psych: Patient is  not psychotic, no suicidal or hemocidal ideation.  Labs on Admission: I have personally reviewed following labs and imaging studies  CBC: Recent Labs  Lab 03/19/24 1146  WBC 3.6*  NEUTROABS 2.8  HGB 11.4*  HCT 36.2*  MCV 92.1  PLT 116*   Basic Metabolic Panel: Recent Labs  Lab 03/19/24 1146  NA 138  K 4.5  CL 101  CO2 25  GLUCOSE 139*  BUN 28*  CREATININE 1.67*  CALCIUM  8.5*   GFR: Estimated Creatinine Clearance: 34.1 mL/min (A) (by C-G  formula based on SCr of 1.67 mg/dL (H)). Liver Function Tests: No results for input(s): AST, ALT, ALKPHOS, BILITOT, PROT, ALBUMIN in the last 168 hours. No results for input(s): LIPASE, AMYLASE in the last 168 hours. No results for input(s): AMMONIA in the last 168 hours. Coagulation Profile: Recent Labs  Lab 03/19/24 1346  INR 1.2   Cardiac Enzymes: No results for input(s): CKTOTAL, CKMB, CKMBINDEX, TROPONINI in the last 168 hours. BNP (last 3 results) No results for input(s): PROBNP in the last 8760 hours. HbA1C: No results for input(s): HGBA1C in the last 72 hours. CBG: No results for input(s): GLUCAP in the last 168 hours. Lipid Profile: No results for input(s): CHOL, HDL, LDLCALC, TRIG, CHOLHDL, LDLDIRECT in the last 72 hours. Thyroid  Function Tests: No results for input(s): TSH, T4TOTAL, FREET4, T3FREE, THYROIDAB in the last 72 hours. Anemia Panel: No results for input(s): VITAMINB12, FOLATE, FERRITIN, TIBC, IRON, RETICCTPCT in the last 72 hours. Urine analysis:    Component Value Date/Time   COLORURINE YELLOW (A) 03/19/2024 1303   APPEARANCEUR CLEAR (A) 03/19/2024 1303   APPEARANCEUR Clear 05/12/2020 0940   LABSPEC 1.016 03/19/2024 1303   PHURINE 6.0 03/19/2024 1303   GLUCOSEU >=500 (A) 03/19/2024 1303   HGBUR LARGE (A) 03/19/2024 1303   BILIRUBINUR NEGATIVE 03/19/2024 1303   BILIRUBINUR Negative 05/12/2020 0940   KETONESUR NEGATIVE 03/19/2024  1303   PROTEINUR 30 (A) 03/19/2024 1303   NITRITE NEGATIVE 03/19/2024 1303   LEUKOCYTESUR NEGATIVE 03/19/2024 1303   Sepsis Labs: @LABRCNTIP (procalcitonin:4,lacticidven:4) )No results found for this or any previous visit (from the past 240 hours).   Radiological Exams on Admission:   Assessment/Plan Principal Problem:   Acute on chronic systolic CHF (congestive heart failure) (HCC) Active Problems:   Dizziness   History of stroke   CAD (coronary artery disease)   Hx of pulmonary embolus   Atrial fibrillation, chronic (HCC)   Essential hypertension   HLD (hyperlipidemia)   Type II diabetes mellitus with renal manifestations (HCC)   COPD (chronic obstructive pulmonary disease) (HCC)   Thrombocytopenia   Chronic kidney disease, stage 3a (HCC)   Pancytopenia (HCC)   Seizure (HCC)   Assessment and Plan:  Acute on chronic systolic CHF (congestive heart failure) (HCC): Patient has mild SOB, significantly elevated BNP 1635, pulm edema chest x-ray, clinically consistent with CHF exacerbation.  2D echo on 08/06/2023 showed EF 30-35%.  Patient has 2 L new oxygen requirement, but no acute respiratory distress.   -Will admit to tele bed  as inpatient -Lasix  40 mg bid by IV -Continue Entresto  -Daily weights -strict I/O's -Low salt diet -Fluid restriction -As needed bronchodilators for shortness of breath  Dizziness: Patient has dizziness and gait instability.  Etiology is not clear.  Initially suspected stroke.  Code stroke was activated.  [facility] of neurology is consulted.  CT head negative for acute intracranial abnormalities.  MRI of brain is also negative for stroke.  - PT/OT - Frequent neurocheck - Fall precaution - Check A1c, fasting lipid panel - IV hydralazine  as needed for SBP > 165 (pt has low EF 30-35% with acute CHF exacerbation, will not allow pressure > 165) - Continue home Eliquis , Lipitor , Plavix   Hx of stroke: --- Continue home Eliquis , Lipitor , Plavix ,  Imodium  CAD (coronary artery disease) -- Continue Lipitor , Plavix , Imdur   Hx of pulmonary embolus -Eliquis   Atrial fibrillation, chronic (HCC):  HR 50-60s -Eliquis , amiodarone , metoprolol   Essential hypertension: Blood pressure 142/62 - IV hydralazine  as needed for SBP> 165 -  Continue home Entresto , metoprolol , Imdur   HLD (hyperlipidemia) -Lipitor   Type II diabetes mellitus with renal manifestations (HCC): Recent A1c 6.5, well-controlled.  Patient is taking Jardiance  and Tresiba 10 units daily -SSI - Glargine insulin  7 units daily  COPD (chronic obstructive pulmonary disease) (HCC): Stable -Dilators and as needed Mucinex   Thrombocytopenia and pancytopenia: Patient has chronic thrombocytopenia.  Platelets 116 today.  Hemoglobin dropped from recent 4.0 on 01/17/2024 to 11.4.  Patient denies rectal bleeding or dark stool. -check anemia panel - Follow-up repeat CBC in morning  Chronic kidney disease, stage 3a (HCC): -f/u with BMP  Seizure: -Seizure precaution - As needed Ativan for seizure - Continue home Depakote  250 mg daily     DVT ppx: on Eliquis   Code Status: Full code  Family Communication:     not done, no family member is at bed side.      Disposition Plan:  Anticipate discharge back to previous environment  Consults called:  Dr. Merrianne of neuro  Admission status and Level of care: Telemetry Medical:  as inpt        Dispo: The patient is from: Home              Anticipated d/c is to: Home              Anticipated d/c date is: 2 days              Patient currently is not medically stable to d/c.    Severity of Illness:  The appropriate patient status for this patient is INPATIENT. Inpatient status is judged to be reasonable and necessary in order to provide the required intensity of service to ensure the patient's safety. The patient's presenting symptoms, physical exam findings, and initial radiographic and laboratory data in the context of their  chronic comorbidities is felt to place them at high risk for further clinical deterioration. Furthermore, it is not anticipated that the patient will be medically stable for discharge from the hospital within 2 midnights of admission.   * I certify that at the point of admission it is my clinical judgment that the patient will require inpatient hospital care spanning beyond 2 midnights from the point of admission due to high intensity of service, high risk for further deterioration and high frequency of surveillance required.*       Date of Service 03/19/2024    Caleb Exon [facility]   If 7PM-7AM, please contact night-coverage [email or web address] 03/19/2024, 10:41 PM

## 2024-03-19 NOTE — ED Provider Notes (Addendum)
 [facility] Emergency Department Provider Note     Event Date/Time   First MD Initiated Contact with Patient 03/19/24 1346     (approximate)   History   Shaky   HPI  Thomas Moore is a 68 y.o. male with a past medical history of CAD, diabetes, HLD, seizures, CVA, CHF and HTN presents to the ED with complaint of generalized weakness, dizziness and loss of balance while walking around his neighborhood approximately around 1030AM today. Patient reports he was unable to walk back home. Patient denies visual changes, chest pain, shortness of breath, and nausea/vomiting. Patient endorses anticoagulation use.     Physical Exam   Triage Vital Signs: ED Triage Vitals  Encounter Vitals Group     BP 03/19/24 1141 (!) 149/69     Girls Systolic BP Percentile --      Girls Diastolic BP Percentile --      Boys Systolic BP Percentile --      Boys Diastolic BP Percentile --      Pulse Rate 03/19/24 1141 65     Resp 03/19/24 1141 18     Temp 03/19/24 1141 98.3 F (36.8 C)     Temp Source 03/19/24 1141 Oral     SpO2 03/19/24 1141 99 %     Weight --      Height --      Head Circumference --      Peak Flow --      Pain Score 03/19/24 1142 0     Pain Loc --      Pain Education --      Exclude from Growth Chart --     Most recent vital signs: Vitals:   03/19/24 1141  BP: (!) 149/69  Pulse: 65  Resp: 18  Temp: 98.3 F (36.8 C)  SpO2: 99%    General: Mental status intact. Alert and oriented x 3 to name, date and place. INAD. Head:  NCAT.  Eyes:  PERRLA. EOMI.  Ears:  Hearing aids in place.  CV:  Good peripheral perfusion. RRR.  RESP:  Normal effort. LCTAB.  ABD:  No distention. Soft, Non tender.  NEURO: No focal neurologic deficits appreciated. Speech is clear. Sensation and motor function intact. Normal muscle strength of UE & LE. Gait is unsteady, unable to stand without loss of balance. unable to assess baseline smile; mild right sided facial  asymmetry noted, unclear if acute or chronic. No obvious facial droop.   ED Results / Procedures / Treatments   Labs (all labs ordered are listed, but only abnormal results are displayed) Labs Reviewed  CBC WITH DIFFERENTIAL/PLATELET - Abnormal; Notable for the following components:      Result Value   WBC 3.6 (*)    RBC 3.93 (*)    Hemoglobin 11.4 (*)    HCT 36.2 (*)    Platelets 116 (*)    Lymphs Abs 0.4 (*)    All other components within normal limits  BASIC METABOLIC PANEL WITH GFR - Abnormal; Notable for the following components:   Glucose, Bld 139 (*)    BUN 28 (*)    Creatinine, Ser 1.67 (*)    Calcium  8.5 (*)    GFR, Estimated 44 (*)    All other components within normal limits  URINALYSIS, ROUTINE W REFLEX MICROSCOPIC - Abnormal; Notable for the following components:   Color, Urine YELLOW (*)    APPearance CLEAR (*)    Glucose, UA >=500 (*)  Hgb urine dipstick LARGE (*)    Protein, ur 30 (*)    All other components within normal limits  PROTIME-INR  APTT   No results found.  PROCEDURES:  Critical Care performed: No  Procedures  MEDICATIONS ORDERED IN ED: Medications - No data to display   IMPRESSION / MDM / ASSESSMENT AND PLAN / ED COURSE  I reviewed the triage vital signs and the nursing notes.                               68 y.o. male presents to the emergency department for evaluation and treatment of acute weakness; dizziness; gait instability. See HPI for further details.   Differential diagnosis includes, but is not limited to intracranial pathology such as stroke or intracerebral hemorrhage; electrolyte abnormality, benign peripheral vertigo, central vertigo (CVA/TIA), metabolic disturbance  Patient's presentation is most consistent with acute presentation with potential threat to life or bodily function.  On initial assessment patient has concerns for possible CVA given physical exam findings.  Supervising physician, Dr. Suzanne immediately  notified and patient assessed at bedside. Code stroke called.  Transferring care to Dr. Suzanne.  FINAL CLINICAL IMPRESSION(S) / ED DIAGNOSES   Final diagnoses:  None    Rx / DC Orders   ED Discharge Orders     None        Note:  This document was prepared using Dragon voice recognition software and may include unintentional dictation errors.    Margrette Monte A, PA-C 03/19/24 1418    Margrette Monte A, PA-C 03/19/24 1419    Suzanne Kirsch, MD 03/19/24 [phone]

## 2024-03-19 NOTE — ED Notes (Signed)
 1C called and informed that pt was on the way.

## 2024-03-19 NOTE — ED Triage Notes (Signed)
 Pt states he went for a walk this morning and sat down and when he got up to go home and was very shaky. He states when he walks he felt weak. Denies any chest pain or sob. Denies any n/v.

## 2024-03-20 ENCOUNTER — Ambulatory Visit: Payer: Self-pay | Admitting: Cardiology

## 2024-03-20 ENCOUNTER — Other Ambulatory Visit: Payer: Self-pay

## 2024-03-20 DIAGNOSIS — R569 Unspecified convulsions: Secondary | ICD-10-CM

## 2024-03-20 DIAGNOSIS — I5023 Acute on chronic systolic (congestive) heart failure: Secondary | ICD-10-CM | POA: Diagnosis not present

## 2024-03-20 DIAGNOSIS — R2681 Unsteadiness on feet: Secondary | ICD-10-CM | POA: Diagnosis not present

## 2024-03-20 DIAGNOSIS — D696 Thrombocytopenia, unspecified: Secondary | ICD-10-CM

## 2024-03-20 DIAGNOSIS — Z8673 Personal history of transient ischemic attack (TIA), and cerebral infarction without residual deficits: Secondary | ICD-10-CM | POA: Diagnosis not present

## 2024-03-20 DIAGNOSIS — R42 Dizziness and giddiness: Secondary | ICD-10-CM | POA: Diagnosis not present

## 2024-03-20 LAB — CUP PACEART REMOTE DEVICE CHECK
Date Time Interrogation Session: 20251014230729
Implantable Pulse Generator Implant Date: 20210503

## 2024-03-20 LAB — LIPID PANEL
Cholesterol: 132 mg/dL (ref 0–200)
HDL: 37 mg/dL — ABNORMAL LOW (ref >40)
LDL Cholesterol: 80 mg/dL (ref 0–99)
Total CHOL/HDL Ratio: 3.6 ratio
Triglycerides: 76 mg/dL (ref <150)
VLDL: 15 mg/dL (ref 0–40)

## 2024-03-20 LAB — BASIC METABOLIC PANEL WITH GFR
Anion gap: 11 (ref 5–15)
BUN: 35 mg/dL — ABNORMAL HIGH (ref 8–23)
CO2: 26 mmol/L (ref 22–32)
Calcium: 8.6 mg/dL — ABNORMAL LOW (ref 8.9–10.3)
Chloride: 102 mmol/L (ref 98–111)
Creatinine, Ser: 1.83 mg/dL — ABNORMAL HIGH (ref 0.61–1.24)
GFR, Estimated: 40 mL/min — ABNORMAL LOW (ref >60)
Glucose, Bld: 116 mg/dL — ABNORMAL HIGH (ref 70–99)
Potassium: 4 mmol/L (ref 3.5–5.1)
Sodium: 139 mmol/L (ref 135–145)

## 2024-03-20 LAB — CBC
HCT: 36.6 % — ABNORMAL LOW (ref 39.0–52.0)
Hemoglobin: 11.5 g/dL — ABNORMAL LOW (ref 13.0–17.0)
MCH: 28.3 pg (ref 26.0–34.0)
MCHC: 31.4 g/dL (ref 30.0–36.0)
MCV: 89.9 fL (ref 80.0–100.0)
Platelets: 115 K/uL — ABNORMAL LOW (ref 150–400)
RBC: 4.07 MIL/uL — ABNORMAL LOW (ref 4.22–5.81)
RDW: 15.1 % (ref 11.5–15.5)
WBC: 6.4 K/uL (ref 4.0–10.5)
nRBC: 0 % (ref 0.0–0.2)

## 2024-03-20 LAB — GLUCOSE, CAPILLARY
Glucose-Capillary: 113 mg/dL — ABNORMAL HIGH (ref 70–99)
Glucose-Capillary: 184 mg/dL — ABNORMAL HIGH (ref 70–99)

## 2024-03-20 LAB — HEMOGLOBIN A1C
Hgb A1c MFr Bld: 6.2 % — ABNORMAL HIGH (ref 4.8–5.6)
Mean Plasma Glucose: 131.24 mg/dL

## 2024-03-20 LAB — MAGNESIUM: Magnesium: 2.3 mg/dL (ref 1.7–2.4)

## 2024-03-20 LAB — VITAMIN B12: Vitamin B-12: <150 pg/mL — ABNORMAL LOW (ref 180–914)

## 2024-03-20 MED ORDER — CYANOCOBALAMIN 1000 MCG/ML IJ SOLN
1000.0000 ug | Freq: Every day | INTRAMUSCULAR | Status: DC
  Administered 2024-03-20: 1000 ug via INTRAMUSCULAR
  Filled 2024-03-20: qty 1

## 2024-03-20 MED ORDER — VITAMIN B-12 1000 MCG PO TABS
1000.0000 ug | ORAL_TABLET | Freq: Every day | ORAL | 1 refills | Status: AC
  Filled 2024-03-20: qty 90, 90d supply, fill #0

## 2024-03-20 MED ORDER — TORSEMIDE 20 MG PO TABS
20.0000 mg | ORAL_TABLET | Freq: Every day | ORAL | 0 refills | Status: DC
  Filled 2024-03-20: qty 30, 30d supply, fill #0

## 2024-03-20 NOTE — Progress Notes (Incomplete)
 Heart Failure Stewardship Pharmacy Note  PCP: Lenon Layman ORN, MD PCP-Cardiologist: Denyse Bathe, MD  HPI: Thomas Moore is a 68 y.o. male with *** who presented with ***.   Pertinent cardiac history:  Pertinent Lab Values: Creatinine, Ser  Date Value Ref Range Status  03/20/2024 1.83 (H) 0.61 - 1.24 mg/dL Final   BUN  Date Value Ref Range Status  03/20/2024 35 (H) 8 - 23 mg/dL Final  96/84/7976 24 8 - 27 mg/dL Final   Potassium  Date Value Ref Range Status  03/20/2024 4.0 3.5 - 5.1 mmol/L Final   Sodium  Date Value Ref Range Status  03/20/2024 139 135 - 145 mmol/L Final  08/17/2021 140 134 - 144 mmol/L Final   B Natriuretic Peptide  Date Value Ref Range Status  03/19/2024 1,635.7 (H) 0.0 - 100.0 pg/mL Final    Comment:    Performed at [facility], [address] [place], [place] [address]   Magnesium   Date Value Ref Range Status  03/20/2024 2.3 1.7 - 2.4 mg/dL Final    Comment:    Performed at [facility], [address] [place], [place] [address]   Hgb A1c MFr Bld  Date Value Ref Range Status  03/19/2024 6.2 (H) 4.8 - 5.6 % Final    Comment:    (NOTE) Diagnosis of Diabetes The following HbA1c ranges recommended by the American Diabetes Association (ADA) may be used as an aid in the diagnosis of diabetes mellitus.  Hemoglobin             Suggested A1C NGSP%              Diagnosis  <5.7                   Non Diabetic  5.7-6.4                Pre-Diabetic  >6.4                   Diabetic  <7.0                   Glycemic control for                       adults with diabetes.     TSH  Date Value Ref Range Status  04/23/2021 5.958 (H) 0.350 - 4.500 uIU/mL Final    Comment:    Performed by a 3rd Generation assay with a functional sensitivity of <=0.01 uIU/mL. Performed at [facility], [address] [place], [place] [address]     Vital Signs: Admission weight: Temp:  [97 F (36.1 C)-98.7 F (37.1 C)]  98.4 F (36.9 C) (10/16 0801) Pulse Rate:  [57-98] 66 (10/16 0801) Cardiac Rhythm: Heart block (10/15 2017) Resp:  [18-22] 20 (10/16 0801) BP: (111-161)/(53-76) 122/60 (10/16 0801) SpO2:  [90 %-99 %] 91 % (10/16 0801) Weight:  [61.5 kg (135 lb 9.3 oz)-61.6 kg (135 lb 12.9 oz)] 61.6 kg (135 lb 12.9 oz) (10/16 0411)  Intake/Output Summary (Last 24 hours) at 03/20/2024 0804 Last data filed at 03/19/2024 2017 Gross per 24 hour  Intake --  Output 300 ml  Net -300 ml    Current Heart Failure Medications:  Loop diuretic: Beta-Blocker: ACEI/ARB/ARNI: MRA: SGLT2i: Other:  Prior to admission Heart Failure Medications:  Loop diuretic: Beta-Blocker: ACEI/ARB/ARNI: MRA: SGLT2i: Other:  Assessment: 1. {CHL AMB Acute or Chronic:210917265}  - Plan: 1) Medication changes recommended  at this time:  2) Patient assistance:   3) Education: -To be completed prior to discharge.  *** Medication Assistance / Insurance Benefits Check: Does the patient have prescription insurance?    Type of insurance plan:  Does the patient qualify for medication assistance through manufacturers or grants? {CHL AMB Yes/No/Pending:210917269}  Eligible grants and/or patient assistance programs: ***  Medication assistance applications in progress: ***  Medication assistance applications approved: *** Approved medication assistance renewals will be completed by: ***  Outpatient Pharmacy: Prior to admission outpatient pharmacy: ***      ***

## 2024-03-20 NOTE — TOC Transition Note (Signed)
 Transition of Care [facility]) - Discharge Note   Patient Details  Name: Thomas Moore MRN: [id number] Date of Birth: March 08, 1956  [facility] CM/SW Contact:  Marinda Cooks, RN Phone Number: 03/20/2024, 5:53 PM   Clinical Narrative:     This CM updated by covering MD pt will medically cleared to dc today no active DC order in at this time . HH arranged with [facility] .DC transportation confirmed for pt with via [employer] team updated . No additional DC needs requested by medical team or identified by CM at this time .    Final next level of care: Home w Home Health Services Barriers to Discharge: No Barriers Identified   Patient Goals and CMS Choice Patient states their goals for this hospitalization and ongoing recovery are:: To return home with [facility]   Choice offered to / list presented to : Patient     Name of family member notified: Patient Patient and family notified of of transfer: 03/20/24  Discharge Plan and Services Additional resources added to the After Visit Summary for    [facility] Dalphine Cella                   Date DME Agency Contacted: 03/20/24 Time DME Agency Contacted: 1750 Representative spoke with at DME Agency: Darleene HH Arranged: OT, PT, RN, Nurse's Aide HH Agency: [facility] Date [facility] Agency Contacted: 03/20/24 Time HH Agency Contacted: 1751 Representative spoke with at [facility] Agency: Darleene  Social Drivers of Health (SDOH) Interventions SDOH Screenings   Food Insecurity: No Food Insecurity (03/19/2024)  Housing: Unknown (03/19/2024)  Transportation Needs: No Transportation Needs (03/19/2024)  Utilities: Not At Risk (03/19/2024)  Financial Resource Strain: Low Risk  (03/06/2024)   Received from [facility]  Social Connections: Moderately Integrated (03/19/2024)  Recent Concern: Social Connections - Moderately Isolated (01/17/2024)  Stress: No Stress Concern Present (02/19/2020)  Tobacco Use: Medium Risk  (03/19/2024)     Readmission Risk Interventions    07/07/2021   10:40 AM  Readmission Risk Prevention Plan  Post Dischage Appt Complete  Medication Screening Complete  Transportation Screening Complete

## 2024-03-20 NOTE — Evaluation (Signed)
 Physical Therapy Evaluation Patient Details Name: Thomas Moore MRN: [id number] DOB: [birth year] Today's Date: 03/20/2024  History of Present Illness  Pt is a 68 y/o M presenting to ED with c/o dizziness, weakness, poor balance. Code stroke activated in ED, imaging negative for acute intracranial abnormality. MD assessment includes acute on chronic systolic CHF, dizziness of unclear etiology. PMH sCHF with EF 30-35%, stroke, afib and PE on Eliquis , CAD s/p CABG, HTN, HLD, DM, CKD-3a, carotid artery stenosis, seizure, chronic back pain, kidney stone, thrombocytopenia.   Clinical Impression  Pt A&Ox4, pleasant and agreeable to PT evaluation. At baseline, pt is typically IND with mobility, occasionally uses rollator for amb for longer distances, denies hx of falls. Pt was met sitting in recliner on 2L supplemental O2, RN entered room and cleared pt for O2 weaning, Thomas Moore removed for duration of session. Pt completed 2 STS from recliner and 1 from toilet with CGA and min VC for hand placement on RW. Pt amb ~118ft with CGA, no LOB, very narrow BOS throughout. Pt returned to supine in bed modI. SpO2 remained >94% throughout session, orthostatic vitals assessed during session with BP WFL throughout with pt asymptomatic. Pt was left semi-supine in bed at end of session, all needs in reach, left on RA with RN informed. Pt would benefit from skilled PT intervention to address listed deficits (see PT Problem List) and allow for safe return to PLOF.       If plan is discharge home, recommend the following: A little help with walking and/or transfers;A little help with bathing/dressing/bathroom;Assistance with cooking/housework;Assist for transportation   Can travel by private vehicle        Equipment Recommendations None recommended by PT (pt has recommended DME)  Recommendations for Other Services       Functional Status Assessment Patient has had a recent decline in their functional status and demonstrates  the ability to make significant improvements in function in a reasonable and predictable amount of time.     Precautions / Restrictions Precautions Precautions: Fall Recall of Precautions/Restrictions: Impaired      Mobility  Bed Mobility Overal bed mobility: Modified Independent             General bed mobility comments: no physical assistance required for sit > supine transfer    Transfers Overall transfer level: Needs assistance Equipment used: Rolling walker (2 wheels) Transfers: Sit to/from Stand Sit to Stand: Contact guard assist           General transfer comment: 2 STS from recliner and 1 from toilet, each with CGA. More effortful to rise from low surface, VC for hand placement on RW    Ambulation/Gait Ambulation/Gait assistance: Contact guard assist Gait Distance (Feet): 150 Feet Assistive device: Rolling walker (2 wheels) Gait Pattern/deviations: Step-through pattern, Trunk flexed, Narrow base of support       General Gait Details: No LOB throughout, very narrow BOS but good ability to navigate tight turns with RW. Mod VC for RW positioning to maintain BOS within RW frame  Stairs            Wheelchair Mobility     Tilt Bed    Modified Rankin (Stroke Patients Only)       Balance Overall balance assessment: Needs assistance Sitting-balance support: Feet supported Sitting balance-Leahy Scale: Normal Sitting balance - Comments: steady static and dynamic sitting   Standing balance support: Bilateral upper extremity supported, No upper extremity supported Standing balance-Leahy Scale: Fair Standing balance comment: increased postural sway  with no UE support                             Pertinent Vitals/Pain Pain Assessment Pain Assessment: No/denies pain    Home Living Family/patient expects to be discharged to:: Private residence Living Arrangements: Alone Available Help at Discharge: Family;Available  PRN/intermittently Type of Home: Apartment Home Access: Level entry       Home Layout: One level Home Equipment: Agricultural consultant (2 wheels);Rollator (4 wheels);Grab bars - toilet;Grab bars - tub/shower      Prior Function Prior Level of Function : Independent/Modified Independent             Mobility Comments: pt reports being able to amb with no AD prior to admission, has rollator and RW that he would use ocassionally, usually pretty active able to walk around apartment complex regularly ADLs Comments: IND with ADLs     Extremity/Trunk Assessment   Upper Extremity Assessment Upper Extremity Assessment: Defer to OT evaluation    Lower Extremity Assessment Lower Extremity Assessment: Generalized weakness       Communication   Communication Communication: Impaired Factors Affecting Communication: Hearing impaired    Cognition Arousal: Alert Behavior During Therapy: WFL for tasks assessed/performed   PT - Cognitive impairments: No apparent impairments                       PT - Cognition Comments: A&Ox4, pleasant but easily distractable and requires frequent cues to redirect to task Following commands: Intact       Cueing Cueing Techniques: Verbal cues, Visual cues     General Comments      Exercises Other Exercises Other Exercises: Orthostatic vitals assessed during session: BP 97/58 (71) sitting in recliner at rest, 105/55 (70) standing, 110/58 (72) standing 3 mins. Other Exercises: Pt initially on 2L [place] with SpO2 98%, removed O2 with RN in room. Pt maintain SpO2 >94% for duration of session with mobility. Pt was left on RA with RN informed   Assessment/Plan    PT Assessment Patient needs continued PT services  PT Problem List Decreased strength;Decreased activity tolerance;Decreased balance;Decreased mobility       PT Treatment Interventions DME instruction;Gait training;Functional mobility training;Therapeutic activities;Therapeutic  exercise;Balance training;Neuromuscular re-education;Cognitive remediation;Patient/family education    PT Goals (Current goals can be found in the Care Plan section)  Acute Rehab PT Goals Patient Stated Goal: to go home PT Goal Formulation: With patient Time For Goal Achievement: 04/03/24 Potential to Achieve Goals: Good    Frequency Min 2X/week     Co-evaluation               AM-PAC PT 6 Clicks Mobility  Outcome Measure Help needed turning from your back to your side while in a flat bed without using bedrails?: None Help needed moving from lying on your back to sitting on the side of a flat bed without using bedrails?: A Little Help needed moving to and from a bed to a chair (including a wheelchair)?: A Little Help needed standing up from a chair using your arms (e.g., wheelchair or bedside chair)?: A Little Help needed to walk in hospital room?: A Little Help needed climbing 3-5 steps with a railing? : A Little 6 Click Score: 19    End of Session Equipment Utilized During Treatment: Gait belt Activity Tolerance: Patient tolerated treatment well Patient left: in bed;with call bell/phone within reach;with bed alarm set Nurse Communication: Mobility  status PT Visit Diagnosis: Unsteadiness on feet (R26.81);Other abnormalities of gait and mobility (R26.89);Difficulty in walking, not elsewhere classified (R26.2);Muscle weakness (generalized) (M62.81)    Time: 9050-8971 PT Time Calculation (min) (ACUTE ONLY): 39 min   Charges:   PT Evaluation $PT Eval Moderate Complexity: 1 Mod PT Treatments $Therapeutic Activity: 8-22 mins PT General Charges $$ ACUTE PT VISIT: 1 Visit         Janell Axe, SPT

## 2024-03-20 NOTE — Discharge Instructions (Signed)
 [facility])  Accepted -- [address] [place], [place] [address] [phone] --

## 2024-03-20 NOTE — Plan of Care (Signed)
 Pt transfer from ED; alert and oriented, no c/o pain; vitals stable. POC continues Problem: Coping: Goal: Ability to adjust to condition or change in health will improve Outcome: Progressing   Problem: Metabolic: Goal: Ability to maintain appropriate glucose levels will improve Outcome: Progressing   Problem: Tissue Perfusion: Goal: Adequacy of tissue perfusion will improve Outcome: Progressing   Problem: Clinical Measurements: Goal: Ability to maintain clinical measurements within normal limits will improve Outcome: Progressing   Problem: Coping: Goal: Level of anxiety will decrease Outcome: Progressing   Problem: Safety: Goal: Ability to remain free from injury will improve Outcome: Progressing   Problem: Pain Managment: Goal: General experience of comfort will improve and/or be controlled Outcome: Progressing

## 2024-03-20 NOTE — Progress Notes (Signed)
 Heart Failure Navigator Progress Note  Assessed for Heart & Vascular TOC clinic readiness.  Patient does not meet criteria due to current [facility] patient.   Navigator will sign off at this time.  Charmaine Pines, RN, BSN [facility] Heart Failure Navigator Secure Chat Only

## 2024-03-20 NOTE — Discharge Summary (Signed)
 Physician Discharge Summary   Patient: Thomas Moore MRN: [id number] DOB: 1956-06-04  Admit date:     03/19/2024  Discharge date: 03/20/24  Discharge Physician: Amaryllis Dare   PCP: Lenon Layman ORN, MD   Recommendations at discharge:  Please obtain CBC and BMP and follow-up Follow-up with primary care provider Follow-up with cardiology  Discharge Diagnoses: Principal Problem:   Acute on chronic systolic CHF (congestive heart failure) (HCC) Active Problems:   Dizziness   History of stroke   CAD (coronary artery disease)   Hx of pulmonary embolus   Atrial fibrillation, chronic (HCC)   Essential hypertension   HLD (hyperlipidemia)   Type II diabetes mellitus with renal manifestations (HCC)   COPD (chronic obstructive pulmonary disease) (HCC)   Thrombocytopenia   Chronic kidney disease, stage 3a (HCC)   Pancytopenia (HCC)   Seizure (HCC)   Acute on chronic systolic (congestive) heart failure (HCC)   Gait instability   Hospital Course: Taken from H&P.  Thomas Moore is a 68 y.o. male with medical history significant of sCHF with EF 30-35%, stroke,  A fib and PE on Eliquis , CAD, s/p of CABG 1997, HTN, HLD, DM, stroke, CKD-3a, SVT, thrombocytopenia, aortic stenosis (s/p of TAVR), bilateral carotid artery stenosis, seizure, chronic back pain, kidney stone (s/p of ESWL), loop recorder, thrombocytopenia, who presents with dizziness and mild SOB.  Dizziness worsened when standing and improved with resting.  On presentation patient was found to be hypoxic at 86% on room air requiring 2 L of oxygen-no baseline oxygen use.  Labs pertinent for  [id number], pancytopenia with WBC 3.6, hemoglobin 11.4, platelets 116 (WBC 5.9, hemoglobin 14.0, platelet 150 on 01/17/24), troponin 15, D-dimer 0.55, INR 1.2, PTT 36. Chest x-ray showed cardiomegaly with pulmonary edema. CT of head negative for acute intracranial abnormalities. MRI of brain negative for acute stroke  EKG with NSR, QTc 439,  first-degree AV block, mild ST depression in V5-V6 and inferior leads.  Patient was admitted for acute on chronic HFrEF causing acute hypoxic respiratory failure.  10/16: Remained hemodynamically stable and able to wean back to room air.  Patient thinks that he is not at baseline.  Clinically appears euvolemic, slight increase in creatinine 1.83 with IV Lasix  so it was not continued.  Patient was started on low-dose torsemide and need to have a close follow-up with his cardiologist for further assistance.  Patient wants to go home and will follow-up with his own providers.  PT evaluated him and recommended home health which was ordered.  Anemia panel with low ferritin, normal iron and low B12.  He was started on B12 supplement and his PCP should be able to follow-up.  Patient will continue on current medications and need to have a close follow-up with his providers for further assistance.  Consultants: None Procedures performed: None Disposition: Home health Diet recommendation:  Discharge Diet Orders (From admission, onward)     Start     Ordered   03/20/24 0000  Diet - low sodium heart healthy        03/20/24 1351           Cardiac and Carb modified diet DISCHARGE MEDICATION: Allergies as of 03/20/2024   No Known Allergies      Medication List     TAKE these medications    acetaminophen  650 MG CR tablet Commonly known as: TYLENOL  Take 1,300 mg by mouth every 8 (eight) hours as needed for pain.   amiodarone  200 MG tablet Commonly known as:  Pacerone  Take 1 tablet (200 mg total) by mouth 2 (two) times daily.   apixaban  5 MG Tabs tablet Commonly known as: ELIQUIS  Take 1 tablet (5 mg total) by mouth 2 (two) times daily.   atorvastatin  80 MG tablet Commonly known as: LIPITOR  Take 1 tablet (80 mg total) by mouth at bedtime.   CALCIUM  + D PO Take 1 tablet by mouth 2 (two) times daily.   clopidogrel  75 MG tablet Commonly known as: PLAVIX  Take 1 tablet by mouth  once daily   cyanocobalamin 1000 MCG tablet Commonly known as: VITAMIN B12 Take 1 tablet (1,000 mcg total) by mouth daily.   divalproex  250 MG 24 hr tablet Commonly known as: DEPAKOTE  ER Take 1 tablet (250 mg total) by mouth at bedtime.   fluticasone -salmeterol 100-50 MCG/ACT Aepb Commonly known as: ADVAIR Inhale into the lungs.   insulin  degludec 100 UNIT/ML FlexTouch Pen Commonly known as: TRESIBA Inject 10 Units into the skin daily.   isosorbide  mononitrate 30 MG 24 hr tablet Commonly known as: IMDUR  Take 1 tablet (30 mg total) by mouth daily.   Jardiance  25 MG Tabs tablet Generic drug: empagliflozin  Take 25 mg by mouth daily.   metoprolol  succinate 25 MG 24 hr tablet Commonly known as: Toprol  XL Take 1 tablet (25 mg total) by mouth daily.   multivitamin capsule Take 1 capsule by mouth daily.   nitroGLYCERIN  0.4 MG SL tablet Commonly known as: NITROSTAT  Place 1 tablet (0.4 mg total) under the tongue every 5 (five) minutes as needed for chest pain.   prednisoLONE acetate 1 % ophthalmic suspension Commonly known as: PRED FORTE Place 1 drop into the left eye 4 (four) times daily.   sacubitril -valsartan  24-26 MG Commonly known as: Entresto  Take 1 tablet by mouth 2 (two) times daily.   torsemide 20 MG tablet Commonly known as: DEMADEX Take 1 tablet (20 mg total) by mouth daily.        Follow-up Information     Lenon Layman ORN, MD Follow up.   Specialty: Internal Medicine Why: hospital follow up Contact information: [address] [facility] [place] [address] [phone]         Fernand Denyse LABOR, MD. Schedule an appointment as soon as possible for a visit.   Specialty: Cardiology Contact information: [address] [place] [address] [phone]                Discharge Exam: Thomas Moore   03/19/24 2011 03/20/24 0411  Weight: 61.5 kg 61.6 kg   General.  Frail gentleman, in no acute  distress. Pulmonary.  Lungs clear bilaterally, normal respiratory effort. CV.  Regular rate and rhythm, no JVD, rub or murmur. Abdomen.  Soft, nontender, nondistended, BS positive. CNS.  Alert and oriented .  No focal neurologic deficit. Extremities.  No edema, no cyanosis, pulses intact and symmetrical. Psychiatry.  Judgment and insight appears normal.   Condition at discharge: stable  The results of significant diagnostics from this hospitalization (including imaging, microbiology, ancillary and laboratory) are listed below for reference.   Imaging Studies: CUP PACEART REMOTE DEVICE CHECK Result Date: 03/20/2024 ILR summary report received. Battery status OK. Normal device function. No new symptom, tachy, brady, or pause episodes. No new AF episodes. Monthly summary reports and ROV/PRN ML, CVRS  MR BRAIN WO CONTRAST Result Date: 03/19/2024 EXAM: MRI BRAIN WITHOUT CONTRAST 03/19/2024 06:29:00 PM TECHNIQUE: Multiplanar multisequence MRI of the head/brain was performed without the administration of intravenous contrast. COMPARISON: Comparison  made with prior CT from earlier the same day. CLINICAL HISTORY: Neuro deficit, acute, stroke suspected. 67 y.o. male with a past medical history of CAD, diabetes, HLD, seizures, CVA, CHF and HTN presents to the ED with complaint of generalized weakness, dizziness and loss of balance while walking around his neighborhood approximately around 1030AM today. Patient reports he was unable to walk back home. Patient denies visual changes, chest pain, shortness of breath, and nausea/vomiting. Patient endorses anticoagulation use. FINDINGS: BRAIN AND VENTRICLES: No acute infarct. No intracranial hemorrhage. No mass. No midline shift. No hydrocephalus. Mild age related cerebral atrophy. Patchy and confluent T2 FLAIR hyperintensity involving the periventricular and deep white matter bilaterally with mild hemispheres, consistent with chronic subvascular ischemic disease,  moderate in nature. Encephalomalacia and gliosis involving the anterior left frontal lobe, consistent with a chronic left ACA distribution infarct. Few additional scattered small remote cortical to subcortical infarcts noted involving the bilateral cerebral hemispheres. Small remote left cerebellar infarct noted. Few small remote lacunar infarcts noted about the hemispheres of the cerebral white matter, right thalamus, and pons. The sella is unremarkable. Normal flow voids. ORBITS: No acute abnormality. SINUSES AND MASTOIDS: Mild scattered mucosal thickening present about the ethmoid cells and maxillary sinuses. Small bilateral mastoid effusions noted, no significant. Imaged nasopharynx unremarkable. BONES AND SOFT TISSUES: Normal marrow signal. No acute soft tissue abnormality. IMPRESSION: 1. No acute intracranial abnormality. 2. Chronic left ACA distribution infarct with a few additional scattered small remote cortical to subcortical infarcts involving the bilateral cerebral hemispheres. 3. Underlying moderate chronic microvascular ischemic disease, with a few scattered remote lacunar infarcts as above. Electronically signed by: Morene Hoard MD 03/19/2024 06:37 PM EDT RP Workstation: [id number]   DG Chest 2 View Result Date: 03/19/2024 EXAM: 2 VIEW(S) XRAY OF THE CHEST 03/19/2024 05:30:00 PM COMPARISON: 01/16/2024 CLINICAL HISTORY: hypoxic. Hypoxic; Pt states he went for a walk this morning and sat down and when he got up to go home and was very shaky. He states when he walks he felt weak. Denies any chest pain or sob. Denies any n/v FINDINGS: LINES, TUBES AND DEVICES: Implanted loop recorder noted. LUNGS AND PLEURA: Progressive perihilar and bibasilar airspace and interstitial opacities. Small bilateral pleural effusions. No pneumothorax. HEART AND MEDIASTINUM: Mild cardiomegaly. TAVR noted. CABG markers noted. No acute abnormality of the cardiac and mediastinal silhouettes. BONES AND SOFT TISSUES:  Sternotomy wires noted. No acute osseous abnormality. IMPRESSION: 1. CHF with pulmonary edema. Electronically signed by: Norman Gatlin MD 03/19/2024 05:47 PM EDT RP Workstation: [id number]   CT HEAD CODE STROKE WO CONTRAST (LKW 0-4.5h, LVO 0-24h) Result Date: 03/19/2024 EXAM: CT HEAD WITHOUT CONTRAST 03/19/2024 02:00:05 PM TECHNIQUE: CT of the head was performed without the administration of intravenous contrast. Automated exposure control, iterative reconstruction, and/or weight based adjustment of the mA/kV was utilized to reduce the radiation dose to as low as reasonably achievable. COMPARISON: CT head 11/14/2022 and MRI head 03/18/2018. CLINICAL HISTORY: Neuro deficit, acute, stroke suspected. FINDINGS: BRAIN AND VENTRICLES: No acute hemorrhage. No evidence of acute infarct. No hydrocephalus. No extra-axial collection. No mass effect or midline shift. Similar encephalomalacia in the anterior left frontal lobe extending into the left superior frontal gyrus compatible with remote infarct. MR brain mild parenchymal volume loss. Atherosclerosis of the carotid siphons and intracranial vertebral arteries. Small remote cortical infarct in the posterior left frontal operculum. [place] stroke program early CT (ASPECT) score: Ganglionic (caudate, internal capsule, lentiform nucleus, insula, M1-M3): 7 Supraganglionic (M4-M6): 3 Total: 10 ORBITS: No acute abnormality.  SINUSES: No acute abnormality. SOFT TISSUES AND SKULL: No acute soft tissue abnormality. No skull fracture. IMPRESSION: 1. No acute intracranial abnormality. 2. Encephalomalacia in the anterior left frontal lobe extending into the left superior frontal gyrus compatible with remote infarct. 3. Small remote cortical infarct in the posterior left frontal operculum. 4. Mild parenchymal volume loss. 5. Findings messaged to Dr. Lindzen at 2:08PM on 03/19/24. Electronically signed by: Donnice Mania MD 03/19/2024 02:09 PM EDT RP Workstation: [id number]     Microbiology: Results for orders placed or performed during the hospital encounter of 08/04/23  MRSA Next Gen by PCR, Nasal     Status: Abnormal   Collection Time: 08/06/23  6:35 AM   Specimen: Nasal Mucosa; Nasal Swab  Result Value Ref Range Status   MRSA by PCR Next Gen DETECTED (A) NOT DETECTED Final    Comment: RESULT CALLED TO, READ BACK BY AND VERIFIED WITH: DONNICE SILVAN RN [phone] 08/06/23 HNM (NOTE) The GeneXpert MRSA Assay (FDA approved for NASAL specimens only), is one component of a comprehensive MRSA colonization surveillance program. It is not intended to diagnose MRSA infection nor to guide or monitor treatment for MRSA infections. Test performance is not FDA approved in patients less than 81 years old. Performed at [facility], [address] [place], [place] [address]     Labs: CBC: Recent Labs  Lab 03/19/24 1146 03/20/24 0521  WBC 3.6* 6.4  NEUTROABS 2.8  --   HGB 11.4* 11.5*  HCT 36.2* 36.6*  MCV 92.1 89.9  PLT 116* 115*   Basic Metabolic Panel: Recent Labs  Lab 03/19/24 1146 03/20/24 0521  NA 138 139  K 4.5 4.0  CL 101 102  CO2 25 26  GLUCOSE 139* 116*  BUN 28* 35*  CREATININE 1.67* 1.83*  CALCIUM  8.5* 8.6*  MG  --  2.3   Liver Function Tests: No results for input(s): AST, ALT, ALKPHOS, BILITOT, PROT, ALBUMIN in the last 168 hours. CBG: Recent Labs  Lab 03/19/24 2246 03/20/24 0757 03/20/24 1146  GLUCAP 171* 113* 184*    Discharge time spent: greater than 30 minutes.  This record has been created using Conservation officer, historic buildings. Errors have been sought and corrected,but may not always be located. Such creation errors do not reflect on the standard of care.   Signed: Amaryllis Dare, MD [facility] 03/20/2024

## 2024-03-20 NOTE — Hospital Course (Addendum)
 Taken from H&P.  Thomas Moore is a 68 y.o. male with medical history significant of sCHF with EF 30-35%, stroke,  A fib and PE on Eliquis , CAD, s/p of CABG 1997, HTN, HLD, DM, stroke, CKD-3a, SVT, thrombocytopenia, aortic stenosis (s/p of TAVR), bilateral carotid artery stenosis, seizure, chronic back pain, kidney stone (s/p of ESWL), loop recorder, thrombocytopenia, who presents with dizziness and mild SOB.  Dizziness worsened when standing and improved with resting.  On presentation patient was found to be hypoxic at 86% on room air requiring 2 L of oxygen-no baseline oxygen use.  Labs pertinent for  [id number], pancytopenia with WBC 3.6, hemoglobin 11.4, platelets 116 (WBC 5.9, hemoglobin 14.0, platelet 150 on 01/17/24), troponin 15, D-dimer 0.55, INR 1.2, PTT 36. Chest x-ray showed cardiomegaly with pulmonary edema. CT of head negative for acute intracranial abnormalities. MRI of brain negative for acute stroke  EKG with NSR, QTc 439, first-degree AV block, mild ST depression in V5-V6 and inferior leads.  Patient was admitted for acute on chronic HFrEF causing acute hypoxic respiratory failure.  10/16: Remained hemodynamically stable and able to wean back to room air.  Patient thinks that he is not at baseline.  Clinically appears euvolemic, slight increase in creatinine 1.83 with IV Lasix  so it was not continued.  Patient was started on low-dose torsemide and need to have a close follow-up with his cardiologist for further assistance.  Patient wants to go home and will follow-up with his own providers.  PT evaluated him and recommended home health which was ordered.  Anemia panel with low ferritin, normal iron and low B12.  He was started on B12 supplement and his PCP should be able to follow-up.  Patient will continue on current medications and need to have a close follow-up with his providers for further assistance.

## 2024-03-20 NOTE — Evaluation (Signed)
 Occupational Therapy Evaluation Patient Details Name: Thomas Moore MRN: [id number] DOB: 08-21-1955 Today's Date: 03/20/2024   History of Present Illness   Thomas Moore is a 68 y.o. male with medical history significant of sCHF with EF 30-35%, stroke,  A fib and PE on Eliquis , CAD, s/p of CABG 1997, HTN, HLD, DM, stroke, CKD-3a, SVT, thrombocytopenia, aortic stenosis (s/p of TAVR), bilateral carotid artery stenosis, seizure, chronic back pain, kidney stone (s/p of ESWL), loop recorder, thrombocytopenia, who presents with dizziness and mild SOB, gait instability.  Patient was last known normal at about 10 AM. Pt suddenly started feeling dizzy with poor balance.  His dizziness is worse when standing up and better on resting.  No unilateral numbness or tingling in extremities.  No facial droop or slurred speech.     Patient also reports mild SOB, no chest pain, cough, fever or chills.  Patient was found to have oxygen desaturation to 86% on room air, which improved with 2 L oxygen in ED.  Patient does not use oxygen at baseline.   Code stroke was activated.  CT of the head is negative.  MRI for brain negative for acute stroke.     Clinical Impressions Patient was seen for OT evaluation this date. Prior to hospital admission, patient was independent with ADLs and mobility. Patient lives alone with intermittent/distant support from one family member and a Network engineer.  Patient semi fowlers in bed, able to perform bed mobility with mod I. Patient on 2L of O2, remained 98% and above throughout OT eval, notified RN. Patient performed LB dressing and grooming without any physical A, supervision provided. Patient performed sit<>stand and in room ambulation with rolling walker with SBA/SPV. Patient transferred to recliner without any A. Patient presents with deficits in dynamic standing balance, overall activity tolerance, affecting safe and optimal ADL completion. Patient is currently requiring supervision for  ADLs.  Paient would benefit from skilled OT services to address noted impairments and functional limitations (see below for any additional details) in order to maximize safety and independence while minimizing future risk of falls, injury, and readmission. Anticipate the need for follow up OT services upon acute hospital DC.      If plan is discharge home, recommend the following:   Assistance with cooking/housework;Assist for transportation     Functional Status Assessment   Patient has had a recent decline in their functional status and demonstrates the ability to make significant improvements in function in a reasonable and predictable amount of time.     Equipment Recommendations   BSC/3in1     Recommendations for Other Services         Precautions/Restrictions   Precautions Precautions: Fall Recall of Precautions/Restrictions: Impaired Restrictions Weight Bearing Restrictions Per Provider Order: No     Mobility Bed Mobility Overal bed mobility: Modified Independent                  Transfers Overall transfer level: Needs assistance Equipment used: Rolling walker (2 wheels) Transfers: Sit to/from Stand Sit to Stand: Supervision                  Balance Overall balance assessment: Needs assistance Sitting-balance support: Feet supported Sitting balance-Leahy Scale: Normal     Standing balance support: Bilateral upper extremity supported, During functional activity Standing balance-Leahy Scale: Fair                             ADL  either performed or assessed with clinical judgement   ADL Overall ADL's : Needs assistance/impaired Eating/Feeding: Set up   Grooming: Supervision/safety   Upper Body Bathing: Supervision/ safety   Lower Body Bathing: Supervison/ safety   Upper Body Dressing : Supervision/safety   Lower Body Dressing: Supervision/safety   Toilet Transfer: Supervision/safety   Toileting- Clothing  Manipulation and Hygiene: Supervision/safety       Functional mobility during ADLs: Supervision/safety;Contact guard assist General ADL Comments: able to don/doff socks, adjust gown, wash hands at sink with SPV     Vision Patient Visual Report: No change from baseline       Perception Perception: Within Functional Limits       Praxis Praxis: WFL       Pertinent Vitals/Pain Pain Assessment Pain Assessment: No/denies pain     Extremity/Trunk Assessment Upper Extremity Assessment Upper Extremity Assessment: Overall WFL for tasks assessed   Lower Extremity Assessment Lower Extremity Assessment: Defer to PT evaluation       Communication Communication Communication: Impaired Factors Affecting Communication: Hearing impaired   Cognition Arousal: Alert   Cognition: Cognition impaired   Orientation impairments: Person, Place, Time, Situation Awareness: Online awareness intact   Attention impairment (select first level of impairment): Selective attention   OT - Cognition Comments: requires max cues to maintain attention to task (primarily during conversation) very tangental                 Following commands: Intact       Cueing  General Comments   Cueing Techniques: Verbal cues      Exercises     Shoulder Instructions      Home Living Family/patient expects to be discharged to:: Private residence Living Arrangements: Alone Available Help at Discharge: Family;Available PRN/intermittently Type of Home: Apartment       Home Layout: One level     Bathroom Shower/Tub: Chief Strategy Officer: Standard Bathroom Accessibility: Yes How Accessible: Accessible via walker Home Equipment: Rolling Walker (2 wheels);Rollator (4 wheels);Grab bars - tub/shower          Prior Functioning/Environment Prior Level of Function : Independent/Modified Independent             Mobility Comments: able to ambulate without AD ADLs Comments:  MOD I    OT Problem List: Decreased strength;Decreased activity tolerance   OT Treatment/Interventions: Self-care/ADL training;Therapeutic exercise;Patient/family education;Balance training      OT Goals(Current goals can be found in the care plan section)   Acute Rehab OT Goals Patient Stated Goal: to not be dizzy and go home. OT Goal Formulation: With patient Time For Goal Achievement: 04/03/24 Potential to Achieve Goals: Good ADL Goals Pt Will Perform Grooming: with modified independence;standing Pt Will Perform Lower Body Bathing: with modified independence;sit to/from stand Pt Will Perform Lower Body Dressing: with modified independence;sit to/from stand Pt Will Transfer to Toilet: with modified independence;regular height toilet   OT Frequency:  Min 2X/week    Co-evaluation              AM-PAC OT 6 Clicks Daily Activity     Outcome Measure Help from another person eating meals?: None Help from another person taking care of personal grooming?: None Help from another person toileting, which includes using toliet, bedpan, or urinal?: None Help from another person bathing (including washing, rinsing, drying)?: A Little Help from another person to put on and taking off regular upper body clothing?: A Little Help from another person to put on and  taking off regular lower body clothing?: A Little 6 Click Score: 21   End of Session Equipment Utilized During Treatment: Gait belt;Rolling walker (2 wheels) Nurse Communication: Mobility status  Activity Tolerance: Patient tolerated treatment well Patient left: in chair;with call bell/phone within reach;with chair alarm set  OT Visit Diagnosis: Unsteadiness on feet (R26.81);Other abnormalities of gait and mobility (R26.89);Muscle weakness (generalized) (M62.81)                Time: 9160-9084 OT Time Calculation (min): 36 min Charges:  OT General Charges $OT Visit: 1 Visit OT Evaluation $OT Eval Low Complexity: 1  Low OT Treatments $Self Care/Home Management : 23-37 mins  Rogers Clause, OT/L MSOT, 03/20/2024

## 2024-03-24 ENCOUNTER — Ambulatory Visit: Admitting: Cardiovascular Disease

## 2024-03-24 ENCOUNTER — Encounter: Payer: Self-pay | Admitting: Cardiovascular Disease

## 2024-03-24 VITALS — BP 94/52 | HR 55 | Ht 63.0 in | Wt 138.0 lb

## 2024-03-24 DIAGNOSIS — R42 Dizziness and giddiness: Secondary | ICD-10-CM | POA: Diagnosis not present

## 2024-03-24 DIAGNOSIS — I5023 Acute on chronic systolic (congestive) heart failure: Secondary | ICD-10-CM

## 2024-03-24 DIAGNOSIS — I951 Orthostatic hypotension: Secondary | ICD-10-CM

## 2024-03-24 DIAGNOSIS — N179 Acute kidney failure, unspecified: Secondary | ICD-10-CM

## 2024-03-24 DIAGNOSIS — I480 Paroxysmal atrial fibrillation: Secondary | ICD-10-CM | POA: Diagnosis not present

## 2024-03-24 DIAGNOSIS — Z131 Encounter for screening for diabetes mellitus: Secondary | ICD-10-CM

## 2024-03-24 DIAGNOSIS — R001 Bradycardia, unspecified: Secondary | ICD-10-CM

## 2024-03-24 DIAGNOSIS — I2510 Atherosclerotic heart disease of native coronary artery without angina pectoris: Secondary | ICD-10-CM

## 2024-03-24 DIAGNOSIS — R0602 Shortness of breath: Secondary | ICD-10-CM | POA: Diagnosis not present

## 2024-03-24 DIAGNOSIS — I6523 Occlusion and stenosis of bilateral carotid arteries: Secondary | ICD-10-CM

## 2024-03-24 DIAGNOSIS — R55 Syncope and collapse: Secondary | ICD-10-CM

## 2024-03-24 DIAGNOSIS — I252 Old myocardial infarction: Secondary | ICD-10-CM

## 2024-03-24 MED ORDER — METOPROLOL SUCCINATE ER 25 MG PO TB24: 12.5000 mg | ORAL_TABLET | Freq: Every day | ORAL | 11 refills | Status: DC

## 2024-03-24 NOTE — Progress Notes (Signed)
 Remote Loop Recorder Transmission

## 2024-03-24 NOTE — Progress Notes (Signed)
 Cardiology Office Note   Date:  03/24/2024   ID:  Thomas Moore, DOB 1956/05/14, MRN [id number]  PCP:  Lenon Layman ORN, MD  Cardiologist:  Denyse Bathe, MD      History of Present Illness: Thomas Moore is a 68 y.o. male who presents for  Chief Complaint  Patient presents with   Hospitalization Follow-up    Hospital Follow up    Was in Admitted to [facility],felt weak and shaky. He had syncope and got fluids for dehydration.      Past Medical History:  Diagnosis Date   CAD (coronary artery disease) of artery bypass graft    s/p CABG x 4 in 1997   CHF (congestive heart failure) (HCC)    COPD (chronic obstructive pulmonary disease) (HCC)    Coronary artery disease    CVA (cerebral vascular accident) (HCC)    Diabetes mellitus without complication (HCC)    Dysrhythmia    GERD (gastroesophageal reflux disease)    HLD (hyperlipidemia)    Hypertension    S/P TAVR (transcatheter aortic valve replacement) 07/05/2021   with Edwards 26mm S3UR via TF approach with Dr. Wonda and Dr. Lucas   Seizures [facility])      Past Surgical History:  Procedure Laterality Date   BYPASS GRAFT ANGIOGRAPHY N/A 04/25/2021   Procedure: BYPASS GRAFT ANGIOGRAPHY;  Surgeon: Lawyer Bernardino Cough, MD;  Location: [facility] INVASIVE CV LAB;  Service: Cardiovascular;  Laterality: N/A;   CARDIAC SURGERY     CORNEAL TRANSPLANT Right    CORONARY ARTERY BYPASS GRAFT  1997   ELECTROPHYSIOLOGY STUDY N/A 09/08/2021   Procedure: ELECTROPHYSIOLOGY STUDY;  Surgeon: Cindie Ole DASEN, MD;  Location: [facility] INVASIVE CV LAB;  Service: Cardiovascular;  Laterality: N/A;   EYE SURGERY     FLEXOR TENDON REPAIR Left 07/11/2019   Procedure: FLEXOR tenolysis  REPAIR LEFT RING FINGER with tednon repair;  Surgeon: Kathlynn Sharper, MD;  Location: [facility] ORS;  Service: Orthopedics;  Laterality: Left;   INCISION AND DRAINAGE ABSCESS Left 05/08/2019   Procedure: INCISION AND DRAINAGE ABSCESS;  Surgeon: Mardee Lynwood SQUIBB, MD;  Location:  [facility] ORS;  Service: Orthopedics;  Laterality: Left;   INTRAOPERATIVE TRANSTHORACIC ECHOCARDIOGRAM N/A 07/05/2021   Procedure: INTRAOPERATIVE TRANSTHORACIC ECHOCARDIOGRAM;  Surgeon: Wonda Sharper, MD;  Location: [facility] OR;  Service: Open Heart Surgery;  Laterality: N/A;   LEFT HEART CATH AND CORONARY ANGIOGRAPHY Left 06/09/2022   Procedure: LEFT HEART CATH AND CORONARY ANGIOGRAPHY;  Surgeon: Bathe Denyse LABOR, MD;  Location: [facility] INVASIVE CV LAB;  Service: Cardiovascular;  Laterality: Left;   LEFT HEART CATH AND CORONARY ANGIOGRAPHY Left 09/27/2023   Procedure: LEFT HEART CATH AND CORONARY ANGIOGRAPHY with possible coronary intervention;  Surgeon: Bathe Denyse LABOR, MD;  Location: [facility] INVASIVE CV LAB;  Service: Cardiovascular;  Laterality: Left;   LEFT HEART CATH AND CORS/GRAFTS ANGIOGRAPHY N/A 10/02/2019   Procedure: LEFT HEART CATH AND CORS/GRAFTS ANGIOGRAPHY;  Surgeon: Anner Alm ORN, MD;  Location: [facility] INVASIVE CV LAB;  Service: Cardiovascular;  Laterality: N/A;   LEFT HEART CATH AND CORS/GRAFTS ANGIOGRAPHY N/A 07/17/2022   Procedure: LEFT HEART CATH AND CORS/GRAFTS ANGIOGRAPHY and possible PCI and stent;  Surgeon: Bathe Denyse LABOR, MD;  Location: [facility] INVASIVE CV LAB;  Service: Cardiovascular;  Laterality: N/A;   LOOP RECORDER INSERTION N/A 10/06/2019   Procedure: LOOP RECORDER INSERTION;  Surgeon: Waddell Danelle ORN, MD;  Location: [facility] INVASIVE CV LAB;  Service: Cardiovascular;  Laterality: N/A;   TRANSCATHETER AORTIC VALVE REPLACEMENT, TRANSFEMORAL N/A 07/05/2021  Procedure: TRANSCATHETER AORTIC VALVE REPLACEMENT, TRANSFEMORAL;  Surgeon: Wonda Sharper, MD;  Location: [facility] OR;  Service: Open Heart Surgery;  Laterality: N/A;   TRIGGER FINGER RELEASE Left      Current Outpatient Medications  Medication Sig Dispense Refill   acetaminophen  (TYLENOL ) 650 MG CR tablet Take 1,300 mg by mouth every 8 (eight) hours as needed for pain.     amiodarone  (PACERONE ) 200 MG tablet Take 1 tablet (200 mg total) by mouth  2 (two) times daily. 180 tablet 1   apixaban  (ELIQUIS ) 5 MG TABS tablet Take 1 tablet (5 mg total) by mouth 2 (two) times daily. 180 tablet 1   atorvastatin  (LIPITOR ) 80 MG tablet Take 1 tablet (80 mg total) by mouth at bedtime. 30 tablet 0   Calcium  Citrate-Vitamin D  (CALCIUM  + D PO) Take 1 tablet by mouth 2 (two) times daily.     clopidogrel  (PLAVIX ) 75 MG tablet Take 1 tablet by mouth once daily 30 tablet 0   cyanocobalamin (VITAMIN B12) 1000 MCG tablet Take 1 tablet (1,000 mcg total) by mouth daily. 90 tablet 1   divalproex  (DEPAKOTE  ER) 250 MG 24 hr tablet Take 1 tablet (250 mg total) by mouth at bedtime. 30 tablet 0   fluticasone -salmeterol (ADVAIR) 100-50 MCG/ACT AEPB Inhale into the lungs.     insulin  degludec (TRESIBA) 100 UNIT/ML FlexTouch Pen Inject 10 Units into the skin daily.     JARDIANCE  25 MG TABS tablet Take 25 mg by mouth daily.     metoprolol  succinate (TOPROL  XL) 25 MG 24 hr tablet Take 0.5 tablets (12.5 mg total) by mouth daily. 15 tablet 11   Multiple Vitamin (MULTIVITAMIN) capsule Take 1 capsule by mouth daily.     nitroGLYCERIN  (NITROSTAT ) 0.4 MG SL tablet Place 1 tablet (0.4 mg total) under the tongue every 5 (five) minutes as needed for chest pain. 30 tablet 2   prednisoLONE acetate (PRED FORTE) 1 % ophthalmic suspension Place 1 drop into the left eye 4 (four) times daily.     sacubitril -valsartan  (ENTRESTO ) 24-26 MG Take 1 tablet by mouth 2 (two) times daily. 180 tablet 1   torsemide (DEMADEX) 20 MG tablet Take 1 tablet (20 mg total) by mouth daily. 30 tablet 0   No current facility-administered medications for this visit.   Facility-Administered Medications Ordered in Other Visits  Medication Dose Route Frequency Provider Last Rate Last Admin   sodium chloride  flush (NS) 0.9 % injection 3 mL  3 mL Intravenous Q12H Fernand Denyse LABOR, MD        Allergies:   Patient has no known allergies.    Social History:   reports that he quit smoking about 41 years ago. His  smoking use included cigarettes. He has never used smokeless tobacco. He reports that he does not currently use alcohol. He reports that he does not use drugs.   Family History:  family history includes Seizures in his brother.    ROS:     Review of Systems  Constitutional: Negative.   HENT: Negative.    Eyes: Negative.   Respiratory: Negative.    Gastrointestinal: Negative.   Genitourinary: Negative.   Musculoskeletal: Negative.   Skin: Negative.   Neurological: Negative.   Endo/Heme/Allergies: Negative.   Psychiatric/Behavioral: Negative.    All other systems reviewed and are negative.     All other systems are reviewed and negative.    PHYSICAL EXAM: VS:  BP (!) 94/52   Pulse (!) 55   Ht 5' 3 (1.6  m)   Wt 138 lb (62.6 kg)   SpO2 98%   BMI 24.45 kg/m  , BMI Body mass index is 24.45 kg/m. Last weight:  Wt Readings from Last 3 Encounters:  03/24/24 138 lb (62.6 kg)  03/20/24 135 lb 12.9 oz (61.6 kg)  02/29/24 138 lb (62.6 kg)     Physical Exam Vitals reviewed.  Constitutional:      Appearance: Normal appearance. He is normal weight.  HENT:     Head: Normocephalic.     Nose: Nose normal.     Mouth/Throat:     Mouth: Mucous membranes are moist.  Eyes:     Pupils: Pupils are equal, round, and reactive to light.  Cardiovascular:     Rate and Rhythm: Normal rate and regular rhythm.     Pulses: Normal pulses.     Heart sounds: Normal heart sounds.  Pulmonary:     Effort: Pulmonary effort is normal.  Abdominal:     General: Abdomen is flat. Bowel sounds are normal.  Musculoskeletal:        General: Normal range of motion.     Cervical back: Normal range of motion.  Skin:    General: Skin is warm.  Neurological:     General: No focal deficit present.     Mental Status: He is alert.  Psychiatric:        Mood and Affect: Mood normal.       EKG:   Recent Labs: 01/16/2024: ALT 20 03/19/2024: B Natriuretic Peptide 1,635.7 03/20/2024: BUN 35;  Creatinine, Ser 1.83; Hemoglobin 11.5; Magnesium  2.3; Platelets 115; Potassium 4.0; Sodium 139    Lipid Panel    Component Value Date/Time   CHOL 132 03/20/2024 0521   CHOL 143 02/25/2020 1126   TRIG 76 03/20/2024 0521   HDL 37 (L) 03/20/2024 0521   HDL 31 (L) 02/25/2020 1126   CHOLHDL 3.6 03/20/2024 0521   VLDL 15 03/20/2024 0521   LDLCALC 80 03/20/2024 0521   LDLCALC 78 02/25/2020 1126      Other studies Reviewed: Additional studies/ records that were reviewed today include:  Review of the above records demonstrates:       No data to display            ASSESSMENT AND PLAN:    ICD-10-CM   1. Bilateral carotid artery stenosis  I65.23 metoprolol  succinate (TOPROL  XL) 25 MG 24 hr tablet    PCV ECHOCARDIOGRAM COMPLETE    MYOCARDIAL PERFUSION IMAGING    2. Dizziness  R42 metoprolol  succinate (TOPROL  XL) 25 MG 24 hr tablet    PCV ECHOCARDIOGRAM COMPLETE    MYOCARDIAL PERFUSION IMAGING    3. SOB (shortness of breath)  R06.02 metoprolol  succinate (TOPROL  XL) 25 MG 24 hr tablet    PCV ECHOCARDIOGRAM COMPLETE    MYOCARDIAL PERFUSION IMAGING   continue GDMT    4. Paroxysmal atrial fibrillation (HCC)  I48.0 metoprolol  succinate (TOPROL  XL) 25 MG 24 hr tablet    PCV ECHOCARDIOGRAM COMPLETE    MYOCARDIAL PERFUSION IMAGING   Had sinus brady 47/min, episodes of afib with RVR    5. Syncope, unspecified syncope type  R55 metoprolol  succinate (TOPROL  XL) 25 MG 24 hr tablet    PCV ECHOCARDIOGRAM COMPLETE    MYOCARDIAL PERFUSION IMAGING   Had dehydration and bradycardia. Will decrease metoprolol  to 12.5 daily.    6. AKI (acute kidney injury)  N17.9 metoprolol  succinate (TOPROL  XL) 25 MG 24 hr tablet    PCV ECHOCARDIOGRAM COMPLETE  MYOCARDIAL PERFUSION IMAGING    7. Orthostatic hypotension  I95.1 metoprolol  succinate (TOPROL  XL) 25 MG 24 hr tablet    PCV ECHOCARDIOGRAM COMPLETE    MYOCARDIAL PERFUSION IMAGING   stop imdur   as BP is low.    8. Coronary artery disease  with hx of myocardial infarct w/o hx of CABG  I25.10 metoprolol  succinate (TOPROL  XL) 25 MG 24 hr tablet   I25.2 PCV ECHOCARDIOGRAM COMPLETE    MYOCARDIAL PERFUSION IMAGING    9. Sinus bradycardia  R00.1 metoprolol  succinate (TOPROL  XL) 25 MG 24 hr tablet    PCV ECHOCARDIOGRAM COMPLETE    MYOCARDIAL PERFUSION IMAGING   decrease metoprolol  12.5 daily.    10. Acute on chronic systolic CHF (congestive heart failure) (HCC)  I50.23 PCV ECHOCARDIOGRAM COMPLETE    MYOCARDIAL PERFUSION IMAGING       Problem List Items Addressed This Visit       Cardiovascular and Mediastinum   Bilateral carotid artery disease - Primary   Relevant Medications   metoprolol  succinate (TOPROL  XL) 25 MG 24 hr tablet   Other Relevant Orders   PCV ECHOCARDIOGRAM COMPLETE   MYOCARDIAL PERFUSION IMAGING   Coronary artery disease with hx of myocardial infarct w/o hx of CABG   Relevant Medications   metoprolol  succinate (TOPROL  XL) 25 MG 24 hr tablet   Other Relevant Orders   PCV ECHOCARDIOGRAM COMPLETE   MYOCARDIAL PERFUSION IMAGING   Paroxysmal atrial fibrillation (HCC)   Relevant Medications   metoprolol  succinate (TOPROL  XL) 25 MG 24 hr tablet   Other Relevant Orders   PCV ECHOCARDIOGRAM COMPLETE   MYOCARDIAL PERFUSION IMAGING   Orthostatic hypotension   Relevant Medications   metoprolol  succinate (TOPROL  XL) 25 MG 24 hr tablet   Other Relevant Orders   PCV ECHOCARDIOGRAM COMPLETE   MYOCARDIAL PERFUSION IMAGING   Acute on chronic systolic CHF (congestive heart failure) (HCC)   Relevant Medications   metoprolol  succinate (TOPROL  XL) 25 MG 24 hr tablet   Other Relevant Orders   PCV ECHOCARDIOGRAM COMPLETE   MYOCARDIAL PERFUSION IMAGING     Genitourinary   AKI (acute kidney injury)   Relevant Medications   metoprolol  succinate (TOPROL  XL) 25 MG 24 hr tablet   Other Relevant Orders   PCV ECHOCARDIOGRAM COMPLETE   MYOCARDIAL PERFUSION IMAGING     Other   Dizziness   Relevant Medications    metoprolol  succinate (TOPROL  XL) 25 MG 24 hr tablet   Other Relevant Orders   PCV ECHOCARDIOGRAM COMPLETE   MYOCARDIAL PERFUSION IMAGING   SOB (shortness of breath)   Relevant Medications   metoprolol  succinate (TOPROL  XL) 25 MG 24 hr tablet   Other Relevant Orders   PCV ECHOCARDIOGRAM COMPLETE   MYOCARDIAL PERFUSION IMAGING   Syncope   Relevant Medications   metoprolol  succinate (TOPROL  XL) 25 MG 24 hr tablet   Other Relevant Orders   PCV ECHOCARDIOGRAM COMPLETE   MYOCARDIAL PERFUSION IMAGING   Other Visit Diagnoses       Sinus bradycardia       decrease metoprolol  12.5 daily.   Relevant Medications   metoprolol  succinate (TOPROL  XL) 25 MG 24 hr tablet   Other Relevant Orders   PCV ECHOCARDIOGRAM COMPLETE   MYOCARDIAL PERFUSION IMAGING          Disposition:   Return in about 3 weeks (around 04/14/2024) for echo, stress test and f/u.    Total time spent: 35 minutes  Signed,  Denyse Bathe, MD  03/24/2024 1:30 PM  [facility]

## 2024-03-25 ENCOUNTER — Ambulatory Visit: Admitting: Occupational Therapy

## 2024-03-25 DIAGNOSIS — M65321 Trigger finger, right index finger: Secondary | ICD-10-CM
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
## 2024-03-26 ENCOUNTER — Other Ambulatory Visit: Payer: Self-pay | Admitting: Orthopedic Surgery

## 2024-03-26 DIAGNOSIS — M6530 Trigger finger, unspecified finger: Secondary | ICD-10-CM

## 2024-03-26 DIAGNOSIS — G5600 Carpal tunnel syndrome, unspecified upper limb: Secondary | ICD-10-CM

## 2024-04-01 ENCOUNTER — Ambulatory Visit: Admitting: Cardiovascular Disease

## 2024-04-07 ENCOUNTER — Ambulatory Visit: Attending: Orthopedic Surgery | Admitting: Occupational Therapy

## 2024-04-07 ENCOUNTER — Ambulatory Visit (INDEPENDENT_AMBULATORY_CARE_PROVIDER_SITE_OTHER)

## 2024-04-07 DIAGNOSIS — M6530 Trigger finger, unspecified finger: Secondary | ICD-10-CM | POA: Insufficient documentation

## 2024-04-07 DIAGNOSIS — M65321 Trigger finger, right index finger: Secondary | ICD-10-CM | POA: Diagnosis present

## 2024-04-07 DIAGNOSIS — I951 Orthostatic hypotension: Secondary | ICD-10-CM

## 2024-04-07 DIAGNOSIS — I361 Nonrheumatic tricuspid (valve) insufficiency: Secondary | ICD-10-CM

## 2024-04-07 DIAGNOSIS — I340 Nonrheumatic mitral (valve) insufficiency: Secondary | ICD-10-CM

## 2024-04-07 DIAGNOSIS — R42 Dizziness and giddiness: Secondary | ICD-10-CM

## 2024-04-07 DIAGNOSIS — I480 Paroxysmal atrial fibrillation: Secondary | ICD-10-CM | POA: Diagnosis not present

## 2024-04-07 DIAGNOSIS — I5023 Acute on chronic systolic (congestive) heart failure: Secondary | ICD-10-CM

## 2024-04-07 DIAGNOSIS — G5600 Carpal tunnel syndrome, unspecified upper limb: Secondary | ICD-10-CM | POA: Diagnosis not present

## 2024-04-07 DIAGNOSIS — N179 Acute kidney failure, unspecified: Secondary | ICD-10-CM

## 2024-04-07 DIAGNOSIS — R001 Bradycardia, unspecified: Secondary | ICD-10-CM

## 2024-04-07 DIAGNOSIS — R55 Syncope and collapse: Secondary | ICD-10-CM

## 2024-04-07 DIAGNOSIS — I2510 Atherosclerotic heart disease of native coronary artery without angina pectoris: Secondary | ICD-10-CM

## 2024-04-07 DIAGNOSIS — I6523 Occlusion and stenosis of bilateral carotid arteries: Secondary | ICD-10-CM

## 2024-04-07 DIAGNOSIS — R0602 Shortness of breath: Secondary | ICD-10-CM
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
## 2024-04-17 ENCOUNTER — Ambulatory Visit

## 2024-04-17 DIAGNOSIS — R0602 Shortness of breath: Secondary | ICD-10-CM

## 2024-04-17 DIAGNOSIS — I6523 Occlusion and stenosis of bilateral carotid arteries: Secondary | ICD-10-CM

## 2024-04-17 DIAGNOSIS — R55 Syncope and collapse: Secondary | ICD-10-CM

## 2024-04-17 DIAGNOSIS — R001 Bradycardia, unspecified: Secondary | ICD-10-CM

## 2024-04-17 DIAGNOSIS — I480 Paroxysmal atrial fibrillation: Secondary | ICD-10-CM

## 2024-04-17 DIAGNOSIS — I5023 Acute on chronic systolic (congestive) heart failure: Secondary | ICD-10-CM

## 2024-04-17 DIAGNOSIS — R42 Dizziness and giddiness: Secondary | ICD-10-CM

## 2024-04-17 DIAGNOSIS — I2510 Atherosclerotic heart disease of native coronary artery without angina pectoris: Secondary | ICD-10-CM

## 2024-04-17 DIAGNOSIS — I951 Orthostatic hypotension: Secondary | ICD-10-CM

## 2024-04-17 DIAGNOSIS — N179 Acute kidney failure, unspecified: Secondary | ICD-10-CM

## 2024-04-17 MED ORDER — TECHNETIUM TC 99M SESTAMIBI GENERIC - CARDIOLITE
10.4000 | Freq: Once | INTRAVENOUS | Status: AC | PRN
  Administered 2024-04-17: 10.4 via INTRAVENOUS

## 2024-04-17 MED ORDER — TECHNETIUM TC 99M SESTAMIBI GENERIC - CARDIOLITE
32.1000 | Freq: Once | INTRAVENOUS | Status: AC | PRN
  Administered 2024-04-17: 32.1 via INTRAVENOUS

## 2024-04-20 ENCOUNTER — Ambulatory Visit: Attending: Cardiology

## 2024-04-20 DIAGNOSIS — R55 Syncope and collapse: Secondary | ICD-10-CM | POA: Diagnosis not present

## 2024-04-21 ENCOUNTER — Encounter: Payer: Self-pay | Admitting: Cardiovascular Disease

## 2024-04-21 ENCOUNTER — Ambulatory Visit (INDEPENDENT_AMBULATORY_CARE_PROVIDER_SITE_OTHER): Admitting: Cardiovascular Disease

## 2024-04-21 VITALS — BP 122/52 | HR 63 | Ht 63.0 in | Wt 141.0 lb

## 2024-04-21 DIAGNOSIS — R55 Syncope and collapse: Secondary | ICD-10-CM | POA: Diagnosis not present

## 2024-04-21 DIAGNOSIS — I2510 Atherosclerotic heart disease of native coronary artery without angina pectoris: Secondary | ICD-10-CM

## 2024-04-21 DIAGNOSIS — I252 Old myocardial infarction: Secondary | ICD-10-CM

## 2024-04-21 DIAGNOSIS — R0602 Shortness of breath: Secondary | ICD-10-CM | POA: Diagnosis not present

## 2024-04-21 DIAGNOSIS — I480 Paroxysmal atrial fibrillation: Secondary | ICD-10-CM | POA: Diagnosis not present

## 2024-04-21 DIAGNOSIS — R0789 Other chest pain: Secondary | ICD-10-CM

## 2024-04-21 DIAGNOSIS — N179 Acute kidney failure, unspecified: Secondary | ICD-10-CM

## 2024-04-21 DIAGNOSIS — Z0130 Encounter for examination of blood pressure without abnormal findings: Secondary | ICD-10-CM

## 2024-04-21 LAB — CUP PACEART REMOTE DEVICE CHECK
Date Time Interrogation Session: 20251115230057
Implantable Pulse Generator Implant Date: 20210503

## 2024-04-21 NOTE — Progress Notes (Signed)
 Cardiology Office Note   Date:  04/21/2024   ID:  Thomas Moore, DOB [birth year], MRN [id number]  PCP:  Lenon Layman ORN, MD  Cardiologist:  Denyse Bathe, MD      History of Present Illness: Thomas Moore is a 68 y.o. male who presents for  Chief Complaint  Patient presents with   Follow-up    Stress test follow up    Occasional chest pain, and left arm pain.      Past Medical History:  Diagnosis Date   CAD (coronary artery disease) of artery bypass graft    s/p CABG x 4 in 1997   CHF (congestive heart failure) (HCC)    COPD (chronic obstructive pulmonary disease) (HCC)    Coronary artery disease    CVA (cerebral vascular accident) (HCC)    Diabetes mellitus without complication (HCC)    Dysrhythmia    GERD (gastroesophageal reflux disease)    HLD (hyperlipidemia)    Hypertension    S/P TAVR (transcatheter aortic valve replacement) 07/05/2021   with Edwards 26mm S3UR via TF approach with Dr. Wonda and Dr. Lucas   Seizures [facility])      Past Surgical History:  Procedure Laterality Date   BYPASS GRAFT ANGIOGRAPHY N/A 04/25/2021   Procedure: BYPASS GRAFT ANGIOGRAPHY;  Surgeon: Lawyer Bernardino Cough, MD;  Location: [facility] INVASIVE CV LAB;  Service: Cardiovascular;  Laterality: N/A;   CARDIAC SURGERY     CORNEAL TRANSPLANT Right    CORONARY ARTERY BYPASS GRAFT  1997   ELECTROPHYSIOLOGY STUDY N/A 09/08/2021   Procedure: ELECTROPHYSIOLOGY STUDY;  Surgeon: Cindie Ole DASEN, MD;  Location: [facility] INVASIVE CV LAB;  Service: Cardiovascular;  Laterality: N/A;   EYE SURGERY     FLEXOR TENDON REPAIR Left 07/11/2019   Procedure: FLEXOR tenolysis  REPAIR LEFT RING FINGER with tednon repair;  Surgeon: Kathlynn Sharper, MD;  Location: [facility] ORS;  Service: Orthopedics;  Laterality: Left;   INCISION AND DRAINAGE ABSCESS Left 05/08/2019   Procedure: INCISION AND DRAINAGE ABSCESS;  Surgeon: Mardee Lynwood SQUIBB, MD;  Location: [facility] ORS;  Service: Orthopedics;  Laterality: Left;    INTRAOPERATIVE TRANSTHORACIC ECHOCARDIOGRAM N/A 07/05/2021   Procedure: INTRAOPERATIVE TRANSTHORACIC ECHOCARDIOGRAM;  Surgeon: Wonda Sharper, MD;  Location: [facility] OR;  Service: Open Heart Surgery;  Laterality: N/A;   LEFT HEART CATH AND CORONARY ANGIOGRAPHY Left 06/09/2022   Procedure: LEFT HEART CATH AND CORONARY ANGIOGRAPHY;  Surgeon: Bathe Denyse LABOR, MD;  Location: [facility] INVASIVE CV LAB;  Service: Cardiovascular;  Laterality: Left;   LEFT HEART CATH AND CORONARY ANGIOGRAPHY Left 09/27/2023   Procedure: LEFT HEART CATH AND CORONARY ANGIOGRAPHY with possible coronary intervention;  Surgeon: Bathe Denyse LABOR, MD;  Location: [facility] INVASIVE CV LAB;  Service: Cardiovascular;  Laterality: Left;   LEFT HEART CATH AND CORS/GRAFTS ANGIOGRAPHY N/A 10/02/2019   Procedure: LEFT HEART CATH AND CORS/GRAFTS ANGIOGRAPHY;  Surgeon: Anner Alm ORN, MD;  Location: [facility] INVASIVE CV LAB;  Service: Cardiovascular;  Laterality: N/A;   LEFT HEART CATH AND CORS/GRAFTS ANGIOGRAPHY N/A 07/17/2022   Procedure: LEFT HEART CATH AND CORS/GRAFTS ANGIOGRAPHY and possible PCI and stent;  Surgeon: Bathe Denyse LABOR, MD;  Location: [facility] INVASIVE CV LAB;  Service: Cardiovascular;  Laterality: N/A;   LOOP RECORDER INSERTION N/A 10/06/2019   Procedure: LOOP RECORDER INSERTION;  Surgeon: Waddell Danelle ORN, MD;  Location: [facility] INVASIVE CV LAB;  Service: Cardiovascular;  Laterality: N/A;   TRANSCATHETER AORTIC VALVE REPLACEMENT, TRANSFEMORAL N/A 07/05/2021   Procedure: TRANSCATHETER AORTIC VALVE REPLACEMENT, TRANSFEMORAL;  Surgeon:  Wonda Sharper, MD;  Location: [facility] OR;  Service: Open Heart Surgery;  Laterality: N/A;   TRIGGER FINGER RELEASE Left      Current Outpatient Medications  Medication Sig Dispense Refill   acetaminophen  (TYLENOL ) 650 MG CR tablet Take 1,300 mg by mouth every 8 (eight) hours as needed for pain.     amiodarone  (PACERONE ) 200 MG tablet Take 1 tablet (200 mg total) by mouth 2 (two) times daily. 180 tablet 1   apixaban  (ELIQUIS )  5 MG TABS tablet Take 1 tablet (5 mg total) by mouth 2 (two) times daily. 180 tablet 1   atorvastatin  (LIPITOR ) 80 MG tablet Take 1 tablet (80 mg total) by mouth at bedtime. 30 tablet 0   Calcium  Citrate-Vitamin D  (CALCIUM  + D PO) Take 1 tablet by mouth 2 (two) times daily.     clopidogrel  (PLAVIX ) 75 MG tablet Take 1 tablet by mouth once daily 30 tablet 0   cyanocobalamin (VITAMIN B12) 1000 MCG tablet Take 1 tablet (1,000 mcg total) by mouth daily. 90 tablet 1   divalproex  (DEPAKOTE  ER) 250 MG 24 hr tablet Take 1 tablet (250 mg total) by mouth at bedtime. 30 tablet 0   fluticasone -salmeterol (ADVAIR) 100-50 MCG/ACT AEPB Inhale into the lungs.     insulin  degludec (TRESIBA) 100 UNIT/ML FlexTouch Pen Inject 10 Units into the skin daily.     JARDIANCE  25 MG TABS tablet Take 25 mg by mouth daily.     metoprolol  succinate (TOPROL  XL) 25 MG 24 hr tablet Take 0.5 tablets (12.5 mg total) by mouth daily. 15 tablet 11   Multiple Vitamin (MULTIVITAMIN) capsule Take 1 capsule by mouth daily.     nitroGLYCERIN  (NITROSTAT ) 0.4 MG SL tablet Place 1 tablet (0.4 mg total) under the tongue every 5 (five) minutes as needed for chest pain. 30 tablet 2   prednisoLONE acetate (PRED FORTE) 1 % ophthalmic suspension Place 1 drop into the left eye 4 (four) times daily.     sacubitril -valsartan  (ENTRESTO ) 24-26 MG Take 1 tablet by mouth 2 (two) times daily. 180 tablet 1   torsemide (DEMADEX) 20 MG tablet Take 1 tablet (20 mg total) by mouth daily. 30 tablet 0   No current facility-administered medications for this visit.   Facility-Administered Medications Ordered in Other Visits  Medication Dose Route Frequency Provider Last Rate Last Admin   sodium chloride  flush (NS) 0.9 % injection 3 mL  3 mL Intravenous Q12H Fernand Denyse LABOR, MD        Allergies:   Patient has no known allergies.    Social History:   reports that he quit smoking about 41 years ago. His smoking use included cigarettes. He has never used  smokeless tobacco. He reports that he does not currently use alcohol. He reports that he does not use drugs.   Family History:  family history includes Seizures in his brother.    ROS:     Review of Systems  Constitutional: Negative.   HENT: Negative.    Eyes: Negative.   Respiratory: Negative.    Gastrointestinal: Negative.   Genitourinary: Negative.   Musculoskeletal: Negative.   Skin: Negative.   Neurological: Negative.   Endo/Heme/Allergies: Negative.   Psychiatric/Behavioral: Negative.    All other systems reviewed and are negative.     All other systems are reviewed and negative.    PHYSICAL EXAM: VS:  BP (!) 122/52   Pulse 63   Ht 5' 3 (1.6 m)   Wt 141 lb (64 kg)  SpO2 98%   BMI 24.98 kg/m  , BMI Body mass index is 24.98 kg/m. Last weight:  Wt Readings from Last 3 Encounters:  04/21/24 141 lb (64 kg)  03/24/24 138 lb (62.6 kg)  03/20/24 135 lb 12.9 oz (61.6 kg)     Physical Exam Vitals reviewed.  Constitutional:      Appearance: Normal appearance. He is normal weight.  HENT:     Head: Normocephalic.     Nose: Nose normal.     Mouth/Throat:     Mouth: Mucous membranes are moist.  Eyes:     Pupils: Pupils are equal, round, and reactive to light.  Cardiovascular:     Rate and Rhythm: Normal rate and regular rhythm.     Pulses: Normal pulses.     Heart sounds: Normal heart sounds.  Pulmonary:     Effort: Pulmonary effort is normal.  Abdominal:     General: Abdomen is flat. Bowel sounds are normal.  Musculoskeletal:        General: Normal range of motion.     Cervical back: Normal range of motion.  Skin:    General: Skin is warm.  Neurological:     General: No focal deficit present.     Mental Status: He is alert.  Psychiatric:        Mood and Affect: Mood normal.       EKG:   Recent Labs: 01/16/2024: ALT 20 03/19/2024: B Natriuretic Peptide 1,635.7 03/20/2024: BUN 35; Creatinine, Ser 1.83; Hemoglobin 11.5; Magnesium  2.3; Platelets  115; Potassium 4.0; Sodium 139    Lipid Panel    Component Value Date/Time   CHOL 132 03/20/2024 0521   CHOL 143 02/25/2020 1126   TRIG 76 03/20/2024 0521   HDL 37 (L) 03/20/2024 0521   HDL 31 (L) 02/25/2020 1126   CHOLHDL 3.6 03/20/2024 0521   VLDL 15 03/20/2024 0521   LDLCALC 80 03/20/2024 0521   LDLCALC 78 02/25/2020 1126      Other studies Reviewed: Additional studies/ records that were reviewed today include:  Review of the above records demonstrates:       No data to display            ASSESSMENT AND PLAN:    ICD-10-CM   1. Paroxysmal atrial fibrillation (HCC)  I48.0     2. AKI (acute kidney injury)  N17.9    Crest 1.83, bun 35. Unable to give ACE/ARBS    3. Syncope, unspecified syncope type  R55     4. SOB (shortness of breath)  R06.02     5. Coronary artery disease with hx of myocardial infarct w/o hx of CABG  I25.10    I25.2     6. Other chest pain  R07.89    Stress test showed apical fixed defect LVEF on echo was 45%. Advise medical treatment.       Problem List Items Addressed This Visit       Cardiovascular and Mediastinum   Coronary artery disease with hx of myocardial infarct w/o hx of CABG   Paroxysmal atrial fibrillation (HCC) - Primary     Genitourinary   AKI (acute kidney injury)     Other   Chest pain   SOB (shortness of breath)   Syncope       Disposition:   Return in about 6 weeks (around 06/02/2024).    Total time spent: 30 minutes  Signed,  Denyse Bathe, MD  04/21/2024 2:04 PM    [facility]

## 2024-04-22 NOTE — Progress Notes (Signed)
 Remote Loop Recorder Transmission

## 2024-04-23 ENCOUNTER — Ambulatory Visit: Payer: Self-pay | Admitting: Cardiology

## 2024-05-04 ENCOUNTER — Other Ambulatory Visit: Payer: Self-pay

## 2024-05-04 ENCOUNTER — Encounter: Payer: Self-pay | Admitting: Emergency Medicine

## 2024-05-04 ENCOUNTER — Emergency Department

## 2024-05-04 ENCOUNTER — Inpatient Hospital Stay: Admission: EM | Admit: 2024-05-04 | Discharge: 2024-05-07 | DRG: 281 | Disposition: A

## 2024-05-04 DIAGNOSIS — I482 Chronic atrial fibrillation, unspecified: Secondary | ICD-10-CM | POA: Diagnosis not present

## 2024-05-04 DIAGNOSIS — I951 Orthostatic hypotension: Secondary | ICD-10-CM

## 2024-05-04 DIAGNOSIS — E785 Hyperlipidemia, unspecified: Secondary | ICD-10-CM | POA: Diagnosis present

## 2024-05-04 DIAGNOSIS — I2510 Atherosclerotic heart disease of native coronary artery without angina pectoris: Secondary | ICD-10-CM | POA: Diagnosis not present

## 2024-05-04 DIAGNOSIS — J449 Chronic obstructive pulmonary disease, unspecified: Secondary | ICD-10-CM | POA: Diagnosis not present

## 2024-05-04 DIAGNOSIS — N179 Acute kidney failure, unspecified: Secondary | ICD-10-CM

## 2024-05-04 DIAGNOSIS — R42 Dizziness and giddiness: Secondary | ICD-10-CM

## 2024-05-04 DIAGNOSIS — I214 Non-ST elevation (NSTEMI) myocardial infarction: Secondary | ICD-10-CM | POA: Diagnosis not present

## 2024-05-04 DIAGNOSIS — I5042 Chronic combined systolic (congestive) and diastolic (congestive) heart failure: Secondary | ICD-10-CM | POA: Diagnosis present

## 2024-05-04 DIAGNOSIS — I480 Paroxysmal atrial fibrillation: Secondary | ICD-10-CM

## 2024-05-04 DIAGNOSIS — Z7901 Long term (current) use of anticoagulants: Secondary | ICD-10-CM

## 2024-05-04 DIAGNOSIS — I10 Essential (primary) hypertension: Secondary | ICD-10-CM | POA: Diagnosis present

## 2024-05-04 DIAGNOSIS — N1831 Chronic kidney disease, stage 3a: Secondary | ICD-10-CM | POA: Diagnosis present

## 2024-05-04 DIAGNOSIS — E1129 Type 2 diabetes mellitus with other diabetic kidney complication: Secondary | ICD-10-CM | POA: Diagnosis present

## 2024-05-04 LAB — COMPREHENSIVE METABOLIC PANEL WITH GFR
ALT: 30 U/L (ref 0–44)
AST: 52 U/L — ABNORMAL HIGH (ref 15–41)
Albumin: 3.9 g/dL (ref 3.5–5.0)
Alkaline Phosphatase: 130 U/L — ABNORMAL HIGH (ref 38–126)
Anion gap: 14 (ref 5–15)
BUN: 29 mg/dL — ABNORMAL HIGH (ref 8–23)
CO2: 22 mmol/L (ref 22–32)
Calcium: 8.5 mg/dL — ABNORMAL LOW (ref 8.9–10.3)
Chloride: 102 mmol/L (ref 98–111)
Creatinine, Ser: 1.52 mg/dL — ABNORMAL HIGH (ref 0.61–1.24)
GFR, Estimated: 50 mL/min — ABNORMAL LOW (ref >60)
Glucose, Bld: 141 mg/dL — ABNORMAL HIGH (ref 70–99)
Potassium: 4.3 mmol/L (ref 3.5–5.1)
Sodium: 138 mmol/L (ref 135–145)
Total Bilirubin: 0.6 mg/dL (ref 0.0–1.2)
Total Protein: 7.9 g/dL (ref 6.5–8.1)

## 2024-05-04 LAB — CBC
HCT: 33.5 % — ABNORMAL LOW (ref 39.0–52.0)
Hemoglobin: 10.6 g/dL — ABNORMAL LOW (ref 13.0–17.0)
MCH: 29.2 pg (ref 26.0–34.0)
MCHC: 31.6 g/dL (ref 30.0–36.0)
MCV: 92.3 fL (ref 80.0–100.0)
Platelets: 118 K/uL — ABNORMAL LOW (ref 150–400)
RBC: 3.63 MIL/uL — ABNORMAL LOW (ref 4.22–5.81)
RDW: 15.6 % — ABNORMAL HIGH (ref 11.5–15.5)
WBC: 7.7 K/uL (ref 4.0–10.5)
nRBC: 0 % (ref 0.0–0.2)

## 2024-05-04 LAB — TROPONIN T, HIGH SENSITIVITY
Troponin T High Sensitivity: 149 ng/L (ref 0–19)
Troponin T High Sensitivity: 1503 ng/L (ref 0–19)

## 2024-05-04 LAB — PROTIME-INR
INR: 1.2 (ref 0.8–1.2)
Prothrombin Time: 15.8 s — ABNORMAL HIGH (ref 11.4–15.2)

## 2024-05-04 LAB — APTT: aPTT: 86 s — ABNORMAL HIGH (ref 24–36)

## 2024-05-04 LAB — HEPARIN LEVEL (UNFRACTIONATED): Heparin Unfractionated: >1.1 [IU]/mL — ABNORMAL HIGH (ref 0.30–0.70)

## 2024-05-04 MED ORDER — INSULIN DEGLUDEC 100 UNIT/ML ~~LOC~~ SOPN: 10.0000 [IU] | PEN_INJECTOR | Freq: Every day | SUBCUTANEOUS | Status: DC

## 2024-05-04 MED ORDER — DIVALPROEX SODIUM ER 250 MG PO TB24
250.0000 mg | ORAL_TABLET | Freq: Every day | ORAL | Status: DC
  Administered 2024-05-04 – 2024-05-06 (×3): 250 mg via ORAL
  Filled 2024-05-04 (×4): qty 1

## 2024-05-04 MED ORDER — METOPROLOL SUCCINATE ER 25 MG PO TB24
12.5000 mg | ORAL_TABLET | Freq: Every day | ORAL | Status: DC
  Administered 2024-05-04 – 2024-05-07 (×4): 12.5 mg via ORAL
  Filled 2024-05-04 (×4): qty 1

## 2024-05-04 MED ORDER — CLOPIDOGREL BISULFATE 75 MG PO TABS
75.0000 mg | ORAL_TABLET | Freq: Every day | ORAL | Status: DC
  Administered 2024-05-04 – 2024-05-07 (×4): 75 mg via ORAL
  Filled 2024-05-04 (×4): qty 1

## 2024-05-04 MED ORDER — EMPAGLIFLOZIN 25 MG PO TABS
25.0000 mg | ORAL_TABLET | Freq: Every day | ORAL | Status: DC
  Administered 2024-05-05 – 2024-05-07 (×3): 25 mg via ORAL
  Filled 2024-05-04 (×3): qty 1

## 2024-05-04 MED ORDER — TORSEMIDE 20 MG PO TABS
20.0000 mg | ORAL_TABLET | Freq: Every day | ORAL | Status: DC
  Administered 2024-05-04 – 2024-05-05 (×2): 20 mg via ORAL
  Filled 2024-05-04 (×2): qty 1

## 2024-05-04 MED ORDER — ASPIRIN 81 MG PO CHEW
324.0000 mg | CHEWABLE_TABLET | ORAL | Status: AC
  Filled 2024-05-04: qty 4

## 2024-05-04 MED ORDER — ASPIRIN 300 MG RE SUPP
300.0000 mg | RECTAL | Status: AC
  Filled 2024-05-04: qty 1

## 2024-05-04 MED ORDER — NITROGLYCERIN 0.4 MG SL SUBL: 0.4000 mg | SUBLINGUAL_TABLET | SUBLINGUAL | Status: DC | PRN

## 2024-05-04 MED ORDER — AMIODARONE HCL 200 MG PO TABS: 200.0000 mg | ORAL_TABLET | Freq: Every day | ORAL | Status: DC

## 2024-05-04 MED ORDER — HEPARIN (PORCINE) 25000 UT/250ML-% IV SOLN
850.0000 [IU]/h | INTRAVENOUS | Status: DC
  Administered 2024-05-04: 850 [IU]/h via INTRAVENOUS
  Filled 2024-05-04: qty 250

## 2024-05-04 MED ORDER — INSULIN GLARGINE-YFGN 100 UNIT/ML ~~LOC~~ SOLN
10.0000 [IU] | Freq: Every day | SUBCUTANEOUS | Status: DC
  Administered 2024-05-05 – 2024-05-07 (×3): 10 [IU] via SUBCUTANEOUS
  Filled 2024-05-04 (×3): qty 0.1

## 2024-05-04 MED ORDER — FLUTICASONE FUROATE-VILANTEROL 100-25 MCG/ACT IN AEPB
1.0000 | INHALATION_SPRAY | Freq: Every day | RESPIRATORY_TRACT | Status: DC
  Administered 2024-05-06 – 2024-05-07 (×2): 1 via RESPIRATORY_TRACT
  Filled 2024-05-04 (×3): qty 28

## 2024-05-04 MED ORDER — HEPARIN BOLUS VIA INFUSION
4000.0000 [IU] | Freq: Once | INTRAVENOUS | Status: AC
  Administered 2024-05-04: 4000 [IU] via INTRAVENOUS
  Filled 2024-05-04: qty 4000

## 2024-05-04 MED ORDER — ACETAMINOPHEN 325 MG PO TABS: 650.0000 mg | ORAL_TABLET | ORAL | Status: DC | PRN

## 2024-05-04 MED ORDER — ASPIRIN 81 MG PO TBEC
81.0000 mg | DELAYED_RELEASE_TABLET | Freq: Every day | ORAL | Status: DC
  Administered 2024-05-05 – 2024-05-06 (×2): 81 mg via ORAL
  Filled 2024-05-04 (×2): qty 1

## 2024-05-04 MED ORDER — ATORVASTATIN CALCIUM 80 MG PO TABS
80.0000 mg | ORAL_TABLET | Freq: Every day | ORAL | Status: DC
  Administered 2024-05-04 – 2024-05-06 (×3): 80 mg via ORAL
  Filled 2024-05-04 (×2): qty 1
  Filled 2024-05-04: qty 4

## 2024-05-04 NOTE — Progress Notes (Deleted)
 Electrophysiology Clinic Note    Date:  05/04/2024  Patient ID:  Thomas Moore, Thomas Moore 04-23-1956, MRN [id number] PCP:  Lenon Layman ORN, MD  Cardiologist:  Denyse Bathe, MD  Electrophysiologist:  OLE ONEIDA HOLTS, MD    ***refresh  Discussed the use of AI scribe software for clinical note transcription with the patient, who gave verbal consent to proceed.   Patient Profile    Chief Complaint: ***  History of Present Illness: Thomas Moore is a 68 y.o. male with PMH notable for CAD s/p CABG, parox afib, HFmrEF, NSVT, HTN, T2DM, CVA, syncope s/p ILR ; seen today for OLE ONEIDA HOLTS, MD for routine electrophysiology followup.   He is s/p EP study to further eval cause of syncope where VT was not able to be sustained. He has ILR in place to further eval syncope.   I last saw him 11/2022 where he was overall doing well, no syncopal episodes. He did have one profoundly dizzy episode.   On follow-up today, *** AF burden, symptoms *** palpitations *** bleeding concerns   - no labs   Since last being seen in our clinic the patient reports doing ***.  he denies chest pain, palpitations, dyspnea, PND, orthopnea, nausea, vomiting, dizziness, syncope, edema, weight gain, or early satiety.      Arrhythmia/Device History MEDTRONIC ILR CARELINK    ROS:  Please see the history of present illness. All other systems are reviewed and otherwise negative.    Physical Exam    VS:  There were no vitals taken for this visit. BMI: There is no height or weight on file to calculate BMI.           Wt Readings from Last 3 Encounters:  05/04/24 157 lb 6.5 oz (71.4 kg)  04/21/24 141 lb (64 kg)  03/24/24 138 lb (62.6 kg)     GEN- The patient is well appearing, alert and oriented x 3 today.   Lungs- Clear to ausculation bilaterally, normal work of breathing.  Heart- {Blank single:19197::Regular,Irregularly irregular} rate and rhythm, no murmurs, rubs or gallops Extremities-  {EDEMA LEVEL:28147::No} peripheral edema, warm, dry Skin-  *** device pocket well-healed, no tethering   *** Brief check performed without iterative lead testing/measurements   Device interrogation done today and reviewed by myself:  Battery *** Lead thresholds, impedence, sensing stable *** *** episodes *** changes made today   Studies Reviewed   Previous EP, cardiology notes.    EKG {ACTION; IS/IS WNU:[id number]} ordered. Personal review of EKG from {Blank single:19197::today,***} shows:  ***        TTE, 04/07/2024 Technically adequate study.  Left atrium severely dilated.  Right atrium moderately dilated.  Left ventricle & right ventricle mildly dilated.  Borderline left ventricular systolic function.  Mild left ventricular hypertrophy.  Normal right ventricular systolic function.  Normal right ventricular diastolic function.  Right ventricular diastolic dysfunction.  Normal left ventricular wall motion.  Normal right ventricular wall motion.  No pulmonary regurgitation.  Trace tricuspid regurgitation.  Normal pulmonary artery pressure.  Mild to moderate mitral regurgitation.  Fibrocalcified aortic valve with no aortic regurgitation.  No pericardial effusion.  Severe Mitral Annular Calcification.  Unable to determine LV diastolic dysfunction due to patient being in  Atrial Fibrillation.   TTE, 10/20/22 Technically difficult study due to body habitus.  Mild left ventricular systolic dysfunction.  Normal right ventricular systolic function.  Normal right ventricular diastolic function.  Mild diffuse hypokinesis  Trace tricuspid regurgitation.  Normal pulmonary  artery pressure.  Mild mitral regurgitation.  Mild aortic regurgitation.  Mild aortic stenosis.  No pericardial effusion.  Mildly dilated Left and Right atrium  Mild LVH      Assessment and Plan     #) ***   #) ***   {Are you ordering a CV Procedure (e.g. stress test, cath, DCCV, TEE,  etc)?   Press F2        :[id number]}   Current medicines are reviewed at length with the patient today.   The patient {ACTIONS; HAS/DOES NOT HAVE:19233} concerns regarding his medicines.  The following changes were made today:  {NONE DEFAULTED:18576}  Labs/ tests ordered today include: *** No orders of the defined types were placed in this encounter.    Disposition: Follow up with {EPMDS:28135::EP Team} or EP APP {EPFOLLOW UP:28173}   Signed, Ladarius Seubert, NP  05/04/24  2:15 PM  Electrophysiology [facility]

## 2024-05-04 NOTE — ED Triage Notes (Signed)
 Pt via [facility] from home. Pt c/o L arm pain that radiates to the L shoulder for the past 45 mins, reports this is the same pain he has had previously when he has had MI in the past. Pt has an extensive cardiac hx. EMS reports pt took his own Nitroglycerin  SL without any relief. EMS also gave 324 ASA en route. EMS reports some EKG Pt is A&OX4 and NAD

## 2024-05-04 NOTE — ED Notes (Signed)
 This RN assisted pt to use urinal. Pt asking for a condom cath so he doesn't need to get up. Pt educated it was good fro him to get up and use the bathroom.

## 2024-05-04 NOTE — Consult Note (Signed)
 Pharmacy Consult Note - Anticoagulation  Pharmacy Consult for Heparin  infusion  Indication: chest pain/ACS No Known Allergies  PATIENT MEASUREMENTS: Height: 5' 3 (160 cm) Weight: 71.4 kg (157 lb 6.5 oz) IBW/kg (Calculated) : 56.9 HEPARIN  DW (KG): 71.2  VITAL SIGNS: Temp: 97.5 F (36.4 C) (11/30 1319) Temp Source: Oral (11/30 1319) BP: 136/65 (11/30 1500) Pulse Rate: 81 (11/30 1500)  Recent Labs    05/04/24 1315  HGB 10.6*  HCT 33.5*  PLT 118*  CREATININE 1.52*    Estimated Creatinine Clearance: 41.3 mL/min (A) (by C-G formula based on SCr of 1.52 mg/dL (H)).  PAST MEDICAL HISTORY: Past Medical History:  Diagnosis Date   CAD (coronary artery disease) of artery bypass graft    s/p CABG x 4 in 1997   CHF (congestive heart failure) (HCC)    COPD (chronic obstructive pulmonary disease) (HCC)    Coronary artery disease    CVA (cerebral vascular accident) (HCC)    Diabetes mellitus without complication (HCC)    Dysrhythmia    GERD (gastroesophageal reflux disease)    HLD (hyperlipidemia)    Hypertension    S/P TAVR (transcatheter aortic valve replacement) 07/05/2021   with Edwards 26mm S3UR via TF approach with Dr. Wonda and Dr. Lucas   Seizures [facility])     Medications:  (Not in a hospital admission)  Scheduled:   aspirin   324 mg Oral NOW   Or   aspirin   300 mg Rectal NOW   [START ON 05/05/2024] aspirin  EC  81 mg Oral Daily   atorvastatin   80 mg Oral QHS   clopidogrel   75 mg Oral Daily   divalproex   250 mg Oral QHS   empagliflozin   25 mg Oral Daily   fluticasone  furoate-vilanterol  1 puff Inhalation Daily   heparin   4,000 Units Intravenous Once   insulin  degludec  10 Units Subcutaneous Daily   metoprolol  succinate  12.5 mg Oral Daily   torsemide   20 mg Oral Daily   Infusions:   heparin      PRN: acetaminophen , nitroGLYCERIN   ASSESSMENT: 68 y.o. male with PMH CAD, CABG, COPD, HTN, HLD, Aortic valve replacement and A.fib on Eliquis    is presenting with  NSTEMI. Patient   Spoke with RN Camelia Eye and confirmed that the patient is unsure of when the last dose of his Eliquis  was taken. Pharmacy has been consulted to initiate and manage heparin  intravenous infusion.   Goal(s) of therapy: Heparin  level 0.3 - 0.7 units/mL aPTT 66 - 102 seconds Monitor platelets by anticoagulation protocol: Yes   Baseline anticoagulation labs: Recent Labs    05/04/24 1315  HGB 10.6*  PLT 118*   Baseline aPTT/INR/and HL pending and may be elevated given prior DOAC use.    PLAN:  Given last dose of Eliquis  unknown by patient, will give 4000 units bolus x1; then start heparin  infusion at 850 units/hour.  Check aPTT in 6 hours.  Continue to titrate by aPTT until heparin  level and aPTT correlate, then titrate by heparin  level alone.  Check heparin  level with next AM labs.  Continue to monitor CBC daily while on heparin  infusion.   Annabella LOISE Banks, PharmD Clinical Pharmacist 05/04/2024 3:29 PM

## 2024-05-04 NOTE — ED Provider Notes (Signed)
 [facility] Provider Note    Event Date/Time   First MD Initiated Contact with Patient 05/04/24 1308     (approximate)  History   Chief Complaint: Arm Pain  HPI  Thomas Moore is a 68 y.o. male with a past medical history of CAD, CABG in 1997, follows up with Dr. Fernand, COPD, gastric reflux, hypertension, hyperlipidemia, aortic valve replacement, atrial fibrillation on Eliquis  presents to the emergency department for left arm pain.  According to the patient approximately 45 minutes prior to arrival he developed pain in his left arm.  According to the patient with his prior heart attacks that presented with left arm pain as well which concerned him so he came to the emergency department.  Patient took 2 nitroglycerin  tablets at home with minimal relief although states significant relief currently.  Denies any shortness of breath nausea or diaphoresis.  Patient denies any pain in the chest, however he states with his prior heart attacks he did not have pain in his chest either.  Physical Exam   Triage Vital Signs: ED Triage Vitals  Encounter Vitals Group     BP --      Girls Systolic BP Percentile --      Girls Diastolic BP Percentile --      Boys Systolic BP Percentile --      Boys Diastolic BP Percentile --      Pulse --      Resp --      Temp --      Temp src --      SpO2 --      Weight 05/04/24 1310 157 lb 6.5 oz (71.4 kg)     Height 05/04/24 1310 5' 3 (1.6 m)     Head Circumference --      Peak Flow --      Pain Score 05/04/24 1309 5     Pain Loc --      Pain Education --      Exclude from Growth Chart --     Most recent vital signs: There were no vitals filed for this visit.  General: Awake, no distress.  Well-appearing.  Answering questions appropriately CV:  Good peripheral perfusion.  Regular rate and rhythm  Resp:  Normal effort.  Equal breath sounds bilaterally.  Abd:  No distention.  Soft, nontender.  No rebound or guarding.  ED  Results / Procedures / Treatments   EKG  EKG viewed and interpreted by myself shows a sinus rhythm at 90 bpm with a slightly widened QRS, normal axis, normal intervals, nonspecific ST changes.  Left bundle branch block.  Largely unchanged from last EKG 03/20/2024  RADIOLOGY  I have reviewed and interpreted the chest x-ray images.  No consolidation. Radiology has read the x-ray as prior CABG and TAVR, minimal interstitial edema   MEDICATIONS ORDERED IN ED: Medications - No data to display   IMPRESSION / MDM / ASSESSMENT AND PLAN / ED COURSE  I reviewed the triage vital signs and the nursing notes.  Patient's presentation is most consistent with acute presentation with potential threat to life or bodily function.  Patient presents emergency department for left arm pain that started approximate 45 minutes prior to arrival.  Patient states he has had several heart attacks in the past that have occurred with left arm symptoms as well.  He was concerned that he could be having a heart issue so he came to the emergency department.  Patient denies any  pain in the chest denies any shortness of breath nausea or diaphoresis.  Overall the patient appears well.  EKG shows no significant change from prior.  Will check labs including cardiac enzymes will obtain chest x-ray we will continue to close monitor while awaiting lab results.  Patient agreeable to plan.  Patient's lab work has resulted showing a reassuring CBC.  Patient's troponin is elevated however at 149.  Will start the patient on IV heparin .  Will admit to the hospital service for further workup treatment for his NSTEMI.  CRITICAL CARE Performed by: Franky Moores   Total critical care time: 30 minutes  Critical care time was exclusive of separately billable procedures and treating other patients.  Critical care was necessary to treat or prevent imminent or life-threatening deterioration.  Critical care was time spent personally  by me on the following activities: development of treatment plan with patient and/or surrogate as well as nursing, discussions with consultants, evaluation of patient's response to treatment, examination of patient, obtaining history from patient or surrogate, ordering and performing treatments and interventions, ordering and review of laboratory studies, ordering and review of radiographic studies, pulse oximetry and re-evaluation of patient's condition.   FINAL CLINICAL IMPRESSION(S) / ED DIAGNOSES   Chest pain NSTEMI  Note:  This document was prepared using Dragon voice recognition software and may include unintentional dictation errors.   Moores Franky, MD 05/04/24 1429

## 2024-05-04 NOTE — H&P (Addendum)
 History and Physical    Patient: Thomas Moore FMW:[id number] DOB: 1955-11-03 DOA: 05/04/2024 DOS: the patient was seen and examined on 05/04/2024 PCP: Lenon Layman ORN, MD  Patient coming from: Home  Chief Complaint:  Chief Complaint  Patient presents with   Arm Pain   HPI: Thomas Moore is a 68 y.o. male with medical history significant of CAD s/p CABG in1997(at age 14), hypertension, diabetes mellitus, history of stroke, chronic kidney disease stage IIIa.  Other comorbidities include ischemic cardiomyopathy with EF of 30 to 35%, A-fib on chronic anticoagulation with Eliquis , aortic stenosis status post TAVR, seizure disorder.  Patient has been in his usual state of health until this afternoon when he suddenly developed left arm pain radiating down to his elbow joint.  Symptoms were of sudden onset. Denied any trauma. Symptoms occurred at rest.  Symptoms were consistent with his previous episodes of heart attack.  He decided to take 2 tablets of aspirin  and nitroglycerin .  Symptoms did not improve much hence he decided to come to the emergency room.  He denied any associated palpitations, nausea, vomiting or diaphoresis.   In the ED, his symptoms had resolved. Workup was significant for elevated troponin from baseline.  Consistent with non-ST elevation MI.  EKG however reviewed did not show any new changes.  Cardiology were consulted from the ED.  Plans for cardiac cath scheduled for a.m. Review of Systems: As mentioned in the history of present illness. All other systems reviewed and are negative. Past Medical History:  Diagnosis Date   CAD (coronary artery disease) of artery bypass graft    s/p CABG x 4 in 1997   CHF (congestive heart failure) (HCC)    COPD (chronic obstructive pulmonary disease) (HCC)    Coronary artery disease    CVA (cerebral vascular accident) (HCC)    Diabetes mellitus without complication (HCC)    Dysrhythmia    GERD (gastroesophageal reflux disease)     HLD (hyperlipidemia)    Hypertension    S/P TAVR (transcatheter aortic valve replacement) 07/05/2021   with Edwards 26mm S3UR via TF approach with Dr. Wonda and Dr. Lucas   Seizures [facility])    Past Surgical History:  Procedure Laterality Date   BYPASS GRAFT ANGIOGRAPHY N/A 04/25/2021   Procedure: BYPASS GRAFT ANGIOGRAPHY;  Surgeon: Lawyer Bernardino Cough, MD;  Location: [facility] INVASIVE CV LAB;  Service: Cardiovascular;  Laterality: N/A;   CARDIAC SURGERY     CORNEAL TRANSPLANT Right    CORONARY ARTERY BYPASS GRAFT  1997   ELECTROPHYSIOLOGY STUDY N/A 09/08/2021   Procedure: ELECTROPHYSIOLOGY STUDY;  Surgeon: Cindie Ole DASEN, MD;  Location: [facility] INVASIVE CV LAB;  Service: Cardiovascular;  Laterality: N/A;   EYE SURGERY     FLEXOR TENDON REPAIR Left 07/11/2019   Procedure: FLEXOR tenolysis  REPAIR LEFT RING FINGER with tednon repair;  Surgeon: Kathlynn Sharper, MD;  Location: [facility] ORS;  Service: Orthopedics;  Laterality: Left;   INCISION AND DRAINAGE ABSCESS Left 05/08/2019   Procedure: INCISION AND DRAINAGE ABSCESS;  Surgeon: Mardee Lynwood SQUIBB, MD;  Location: [facility] ORS;  Service: Orthopedics;  Laterality: Left;   INTRAOPERATIVE TRANSTHORACIC ECHOCARDIOGRAM N/A 07/05/2021   Procedure: INTRAOPERATIVE TRANSTHORACIC ECHOCARDIOGRAM;  Surgeon: Wonda Sharper, MD;  Location[facility];  Service: Open Heart Surgery;  Laterality: N/A;   LEFT HEART CATH AND CORONARY ANGIOGRAPHY Left 06/09/2022   Procedure: LEFT HEART CATH AND CORONARY ANGIOGRAPHY;  Surgeon: Fernand Denyse LABOR, MD;  Location: [facility] INVASIVE CV LAB;  Service: Cardiovascular;  Laterality: Left;  LEFT HEART CATH AND CORONARY ANGIOGRAPHY Left 09/27/2023   Procedure: LEFT HEART CATH AND CORONARY ANGIOGRAPHY with possible coronary intervention;  Surgeon: Fernand Denyse LABOR, MD;  Location: [facility] INVASIVE CV LAB;  Service: Cardiovascular;  Laterality: Left;   LEFT HEART CATH AND CORS/GRAFTS ANGIOGRAPHY N/A 10/02/2019   Procedure: LEFT HEART CATH AND CORS/GRAFTS  ANGIOGRAPHY;  Surgeon: Anner Alm ORN, MD;  Location: [facility] INVASIVE CV LAB;  Service: Cardiovascular;  Laterality: N/A;   LEFT HEART CATH AND CORS/GRAFTS ANGIOGRAPHY N/A 07/17/2022   Procedure: LEFT HEART CATH AND CORS/GRAFTS ANGIOGRAPHY and possible PCI and stent;  Surgeon: Fernand Denyse LABOR, MD;  Location: [facility] INVASIVE CV LAB;  Service: Cardiovascular;  Laterality: N/A;   LOOP RECORDER INSERTION N/A 10/06/2019   Procedure: LOOP RECORDER INSERTION;  Surgeon: Waddell Danelle ORN, MD;  Location: [facility] INVASIVE CV LAB;  Service: Cardiovascular;  Laterality: N/A;   TRANSCATHETER AORTIC VALVE REPLACEMENT, TRANSFEMORAL N/A 07/05/2021   Procedure: TRANSCATHETER AORTIC VALVE REPLACEMENT, TRANSFEMORAL;  Surgeon: Wonda Sharper, MD;  Location: [facility] OR;  Service: Open Heart Surgery;  Laterality: N/A;   TRIGGER FINGER RELEASE Left    Social History:  reports that he quit smoking about 41 years ago. His smoking use included cigarettes. He has never used smokeless tobacco. He reports that he does not currently use alcohol. He reports that he does not use drugs.  No Known Allergies  Family History  Problem Relation Age of Onset   Seizures Brother     Prior to Admission medications   Medication Sig Start Date End Date Taking? Authorizing Provider  acetaminophen  (TYLENOL ) 650 MG CR tablet Take 1,300 mg by mouth every 8 (eight) hours as needed for pain.    [provider]  amiodarone  (PACERONE ) 200 MG tablet Take 1 tablet (200 mg total) by mouth 2 (two) times daily. 01/22/24   Fernand Denyse LABOR, MD  apixaban  (ELIQUIS ) 5 MG TABS tablet Take 1 tablet (5 mg total) by mouth 2 (two) times daily. 11/14/22   Riddle, Suzann, NP  atorvastatin  (LIPITOR ) 80 MG tablet Take 1 tablet (80 mg total) by mouth at bedtime. 08/06/23   Barbarann Nest, MD  Calcium  Citrate-Vitamin D  (CALCIUM  + D PO) Take 1 tablet by mouth 2 (two) times daily.    [provider]  clopidogrel  (PLAVIX ) 75 MG tablet Take 1 tablet by mouth once daily  02/15/24   Fernand Denyse LABOR, MD  cyanocobalamin  (VITAMIN B12) 1000 MCG tablet Take 1 tablet (1,000 mcg total) by mouth daily. 03/20/24   Caleen Qualia, MD  divalproex  (DEPAKOTE  ER) 250 MG 24 hr tablet Take 1 tablet (250 mg total) by mouth at bedtime. 08/06/23   Barbarann Nest, MD  fluticasone -salmeterol (ADVAIR) 100-50 MCG/ACT AEPB Inhale into the lungs. 06/25/23   [provider]  insulin  degludec (TRESIBA) 100 UNIT/ML FlexTouch Pen Inject 10 Units into the skin daily. 07/11/23   [provider]  JARDIANCE  25 MG TABS tablet Take 25 mg by mouth daily.    [provider]  metoprolol  succinate (TOPROL  XL) 25 MG 24 hr tablet Take 0.5 tablets (12.5 mg total) by mouth daily. 03/24/24 03/24/25  Fernand Denyse LABOR, MD  Multiple Vitamin (MULTIVITAMIN) capsule Take 1 capsule by mouth daily.    [provider]  nitroGLYCERIN  (NITROSTAT ) 0.4 MG SL tablet Place 1 tablet (0.4 mg total) under the tongue every 5 (five) minutes as needed for chest pain. 08/03/23   Scoggins, Amber, NP  prednisoLONE acetate (PRED FORTE) 1 % ophthalmic suspension Place 1 drop into  the left eye 4 (four) times daily. 01/16/24   [provider]  sacubitril -valsartan  (ENTRESTO ) 24-26 MG Take 1 tablet by mouth 2 (two) times daily. 01/22/24   Fernand Denyse LABOR, MD  torsemide  (DEMADEX ) 20 MG tablet Take 1 tablet (20 mg total) by mouth daily. 03/20/24   Caleen Qualia, MD    Physical Exam: Vitals:   05/04/24 1310 05/04/24 1319 05/04/24 1430 05/04/24 1500  BP:  (!) 143/83 128/64 136/65  Pulse:  86 72 81  Resp:  (!) 22 (!) 22 19  Temp:  (!) 97.5 F (36.4 C)    TempSrc:  Oral    SpO2:  97% 92% 97%  Weight: 71.4 kg     Height: 5' 3 (1.6 m)      General: Patient is alert oriented x 3.  Denies any chest pain or palpitation. HEENT: Head is atraumatic. Neck: Supple with no JVD. Chest: Clinically clear with no added sounds Abdomen: Soft nontender with no organomegaly CNS shows no focal deficits Skin  negative for any new rash.  Data Reviewed: Sodium is 138, potassium 4.3, chloride 102, bicarb 22, glucose 141, BUN 29, creatinine 1.52, calcium  is 8.5, anion gap is 14, AST 52, ALT 30, troponin was 149  WBC 7.7, hemoglobin 10.6, hematocrit 34, platelet count 218, glucose 141  EKG # shows normal sinus rhythm with nonspecific ST-T wave changes.  These changes are not new.   Assessment and Plan: 68 year old gentleman with complex medical history and coronary tree disease at the age of 28.  He status post CABG in 1997.  He presents with left arm pain consistent with his previous episode of acute coronary event.  He was found to have elevated cardiac markers on admission.  Patient was initiated on heparin  gtt in the ED.  Cardiology was consulted from the ED.  Plan for cardiac cath in a.m. for restratification.  Acute chest pain: Symptoms consistent with angina.  Elevated cardiac markers consistent with NSTEMI.  Patient has known history of coronary artery disease with four-vessel CABG.  It is unclear if patient has been compliant with aspirin  and statins.  Patient was started on ACS protocol with heparin  gtt.  Cardiology is planning for further restratification with cardiac cath in AM.  NSTEMI: New Troponin trend 149>>>1503 Patient was initiated on Heparin  protocol for ACS  HFrEF: Known left ventricular ejection fraction of 30 to 35%.  Patient is on goal-directed medical therapy. Repeat ECHO ordered to rval for new wall motion abnormalities. He is euvolemic at this time on torsemide   History of pulm emboli: Chronically on Eliquis .  Eliquis  was held on this admission.  Patient is on heparin   Paroxysmal atrial fibrillation: Patient has been on Eliquis , amiodarone  and metoprolol . BPH he will be resumed on same.  Patient follows up with Dr. Fernand.Resumed on amiodarone . Qtc wnl at this time. Monitor rhythm on telemetry  Chronic kidney disease: Stage III.  Renal function remained stable.  History of  pulm embolism: Patient is chronically on Eliquis  for anticoagulation.  At this time Eliquis  is on hold and patient currently on heparin  gtt  History of stroke and seizure disorder: Patient is chronically on Depakote .  Patient is also on aspirin  and statins.  Essential hypertension: Resume on home medication.  Blood pressure monitoring as per protocol.  Diabetes mellitus: Last A1c was 6.5.  Patient is on Tresiba at home and will continue with same.  He is also on Jardiance .  Insulin  sliding scale for optimization.  Thrombocytopenia: Chronic and stable.  No bleeding diathesis  COPD: Patient is not in any acute flare at this time.  Bronchodilators as needed   Advance Care Planning:   Code Status: Full Code   Consults: Cardiology consulted from the ED  Severity of Illness: The appropriate patient status for this patient is INPATIENT. Inpatient status is judged to be reasonable and necessary in order to provide the required intensity of service to ensure the patient's safety. The patient's presenting symptoms, physical exam findings, and initial radiographic and laboratory data in the context of their chronic comorbidities is felt to place them at high risk for further clinical deterioration. Furthermore, it is not anticipated that the patient will be medically stable for discharge from the hospital within 2 midnights of admission.   * I certify that at the point of admission it is my clinical judgment that the patient will require inpatient hospital care spanning beyond 2 midnights from the point of admission due to high intensity of service, high risk for further deterioration and high frequency of surveillance required.*  Author: Maude MARLA Dart, MD 05/04/2024 3:20 PM  For on call review [email or web address].

## 2024-05-04 NOTE — ED Notes (Signed)
 Pt needed assistance using urinal. Pt able to ambulate to use urinal and needed little assistance. Pt informed this ED tech that he wanted a purewick. Informed ED nurse. Call bell within reach and no further needs at this time.

## 2024-05-04 NOTE — Progress Notes (Signed)
 Cardiology consulted for NSTEMI. Symptoms are the same and consistent with prior infarction.  Continue with heparin  gtt. NPO/CLD after midnight for tentative LHC with possible intervention tomorrow 12/1. Restart home cardiac meds as BP/HR allow. If progresses to STEMI, please reach out to STEMI call provider. Maintain in telemetry. SL nitro as needed.  Imran Nuon, DO [facility]

## 2024-05-05 ENCOUNTER — Inpatient Hospital Stay: Admit: 2024-05-05 | Discharge: 2024-05-05 | Disposition: A | Attending: Student

## 2024-05-05 ENCOUNTER — Ambulatory Visit: Admitting: Cardiology

## 2024-05-05 ENCOUNTER — Telehealth (HOSPITAL_COMMUNITY): Payer: Self-pay | Admitting: Pharmacy Technician

## 2024-05-05 ENCOUNTER — Encounter: Admission: EM | Disposition: A | Payer: Self-pay | Source: Home / Self Care | Attending: Family Medicine

## 2024-05-05 ENCOUNTER — Other Ambulatory Visit (HOSPITAL_COMMUNITY): Payer: Self-pay

## 2024-05-05 DIAGNOSIS — I2510 Atherosclerotic heart disease of native coronary artery without angina pectoris: Secondary | ICD-10-CM

## 2024-05-05 DIAGNOSIS — I214 Non-ST elevation (NSTEMI) myocardial infarction: Secondary | ICD-10-CM

## 2024-05-05 DIAGNOSIS — I4729 Other ventricular tachycardia: Secondary | ICD-10-CM

## 2024-05-05 DIAGNOSIS — J449 Chronic obstructive pulmonary disease, unspecified: Secondary | ICD-10-CM

## 2024-05-05 DIAGNOSIS — I480 Paroxysmal atrial fibrillation: Secondary | ICD-10-CM

## 2024-05-05 DIAGNOSIS — I482 Chronic atrial fibrillation, unspecified: Secondary | ICD-10-CM

## 2024-05-05 HISTORY — PX: LEFT HEART CATH AND CORS/GRAFTS ANGIOGRAPHY: CATH118250

## 2024-05-05 LAB — CBC
HCT: 31.7 % — ABNORMAL LOW (ref 39.0–52.0)
Hemoglobin: 10.2 g/dL — ABNORMAL LOW (ref 13.0–17.0)
MCH: 29.5 pg (ref 26.0–34.0)
MCHC: 32.2 g/dL (ref 30.0–36.0)
MCV: 91.6 fL (ref 80.0–100.0)
Platelets: 112 K/uL — ABNORMAL LOW (ref 150–400)
RBC: 3.46 MIL/uL — ABNORMAL LOW (ref 4.22–5.81)
RDW: 15.8 % — ABNORMAL HIGH (ref 11.5–15.5)
WBC: 5.8 K/uL (ref 4.0–10.5)
nRBC: 0 % (ref 0.0–0.2)

## 2024-05-05 LAB — ECHOCARDIOGRAM COMPLETE
AR max vel: 1.62 cm2
AV Area VTI: 1.88 cm2
AV Area mean vel: 1.62 cm2
AV Mean grad: 4.3 mmHg
AV Peak grad: 7.3 mmHg
Ao pk vel: 1.35 m/s
Area-P 1/2: 3.34 cm2
Calc EF: 29.5 %
Height: 63 in
MV VTI: 1.19 cm2
S' Lateral: 4.2 cm
Single Plane A2C EF: 22.8 %
Single Plane A4C EF: 35.8 %
Weight: 2518.54 [oz_av]

## 2024-05-05 LAB — LIPID PANEL
Cholesterol: 157 mg/dL (ref 0–200)
HDL: 37 mg/dL — ABNORMAL LOW (ref >40)
LDL Cholesterol: 104 mg/dL — ABNORMAL HIGH (ref 0–99)
Total CHOL/HDL Ratio: 4.3 ratio
Triglycerides: 83 mg/dL (ref <150)
VLDL: 17 mg/dL (ref 0–40)

## 2024-05-05 LAB — BASIC METABOLIC PANEL WITH GFR
Anion gap: 12 (ref 5–15)
BUN: 31 mg/dL — ABNORMAL HIGH (ref 8–23)
CO2: 22 mmol/L (ref 22–32)
Calcium: 8.6 mg/dL — ABNORMAL LOW (ref 8.9–10.3)
Chloride: 103 mmol/L (ref 98–111)
Creatinine, Ser: 1.92 mg/dL — ABNORMAL HIGH (ref 0.61–1.24)
GFR, Estimated: 37 mL/min — ABNORMAL LOW (ref >60)
Glucose, Bld: 122 mg/dL — ABNORMAL HIGH (ref 70–99)
Potassium: 4.1 mmol/L (ref 3.5–5.1)
Sodium: 137 mmol/L (ref 135–145)

## 2024-05-05 LAB — APTT: aPTT: >200 s (ref 24–36)

## 2024-05-05 LAB — GLUCOSE, CAPILLARY: Glucose-Capillary: 83 mg/dL (ref 70–99)

## 2024-05-05 SURGERY — LEFT HEART CATH AND CORS/GRAFTS ANGIOGRAPHY
Anesthesia: Moderate Sedation

## 2024-05-05 MED ORDER — MIDAZOLAM HCL 2 MG/2ML IJ SOLN
INTRAMUSCULAR | Status: AC
  Filled 2024-05-05: qty 2

## 2024-05-05 MED ORDER — SODIUM CHLORIDE 0.9 % IV SOLN: INTRAVENOUS | Status: AC

## 2024-05-05 MED ORDER — SODIUM CHLORIDE 0.9 % IV SOLN: INTRAVENOUS | Status: DC

## 2024-05-05 MED ORDER — IOHEXOL 300 MG/ML  SOLN
INTRAMUSCULAR | Status: DC | PRN
  Administered 2024-05-05: 60 mL

## 2024-05-05 MED ORDER — ACETAMINOPHEN 325 MG PO TABS: 650.0000 mg | ORAL_TABLET | ORAL | Status: DC | PRN

## 2024-05-05 MED ORDER — SODIUM CHLORIDE 0.9 % IV BOLUS
250.0000 mL | Freq: Once | INTRAVENOUS | Status: AC
  Administered 2024-05-05: 250 mL via INTRAVENOUS

## 2024-05-05 MED ORDER — SODIUM CHLORIDE 0.9 % IV SOLN: 250.0000 mL | INTRAVENOUS | Status: AC | PRN

## 2024-05-05 MED ORDER — HEPARIN (PORCINE) IN NACL 1000-0.9 UT/500ML-% IV SOLN
INTRAVENOUS | Status: DC | PRN
  Administered 2024-05-05 (×2): 500 mL

## 2024-05-05 MED ORDER — LIDOCAINE HCL 1 % IJ SOLN
INTRAMUSCULAR | Status: AC
  Filled 2024-05-05: qty 20

## 2024-05-05 MED ORDER — FENTANYL CITRATE (PF) 100 MCG/2ML IJ SOLN
INTRAMUSCULAR | Status: AC
  Filled 2024-05-05: qty 2

## 2024-05-05 MED ORDER — FENTANYL CITRATE (PF) 100 MCG/2ML IJ SOLN
INTRAMUSCULAR | Status: DC | PRN
  Administered 2024-05-05: 25 ug via INTRAVENOUS

## 2024-05-05 MED ORDER — SODIUM CHLORIDE 0.9% FLUSH
3.0000 mL | Freq: Two times a day (BID) | INTRAVENOUS | Status: DC
  Administered 2024-05-05 – 2024-05-07 (×4): 3 mL via INTRAVENOUS

## 2024-05-05 MED ORDER — HEPARIN (PORCINE) 25000 UT/250ML-% IV SOLN
600.0000 [IU]/h | INTRAVENOUS | Status: DC
  Administered 2024-05-05: 600 [IU]/h via INTRAVENOUS

## 2024-05-05 MED ORDER — MIDAZOLAM HCL (PF) 2 MG/2ML IJ SOLN
INTRAMUSCULAR | Status: DC | PRN
  Administered 2024-05-05: 1 mg via INTRAVENOUS

## 2024-05-05 MED ORDER — ENSURE PLUS HIGH PROTEIN PO LIQD
237.0000 mL | Freq: Two times a day (BID) | ORAL | Status: DC
  Administered 2024-05-05 – 2024-05-07 (×5): 237 mL via ORAL

## 2024-05-05 MED ORDER — HYDRALAZINE HCL 20 MG/ML IJ SOLN: 10.0000 mg | INTRAMUSCULAR | Status: AC | PRN

## 2024-05-05 MED ORDER — LIDOCAINE HCL (PF) 1 % IJ SOLN
INTRAMUSCULAR | Status: DC | PRN
  Administered 2024-05-05: 20 mL

## 2024-05-05 MED ORDER — SODIUM CHLORIDE 0.9% FLUSH: 3.0000 mL | INTRAVENOUS | Status: DC | PRN

## 2024-05-05 SURGICAL SUPPLY — 10 items
CATH INFINITI 5FR JL4 (CATHETERS) IMPLANT
CATH INFINITI JR4 5F (CATHETERS) IMPLANT
DEVICE CLOSURE MYNXGRIP 5F (Vascular Products) IMPLANT
DRAPE BRACHIAL (DRAPES) IMPLANT
NDL PERC 18GX7CM (NEEDLE) IMPLANT
PACK CARDIAC CATH (CUSTOM PROCEDURE TRAY) ×2 IMPLANT
SET ATX-X65L (MISCELLANEOUS) IMPLANT
SHEATH AVANTI 5FR X 11CM (SHEATH) IMPLANT
STATION PROTECTION PRESSURIZED (MISCELLANEOUS) IMPLANT
WIRE GUIDERIGHT .035X150 (WIRE) IMPLANT

## 2024-05-05 NOTE — Progress Notes (Signed)
*  PRELIMINARY RESULTS* Echocardiogram 2D Echocardiogram has been performed.  Floydene Harder 05/05/2024, 10:47 AM
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
## 2024-05-05 NOTE — ED Notes (Signed)
Pt assisted with urinal use. 

## 2024-05-05 NOTE — Progress Notes (Addendum)
 PROGRESS NOTE    Thomas Moore  FMW:[id number] DOB: Jan 23, 1956 DOA: 05/04/2024 PCP: Lenon Layman ORN, MD  Chief Complaint  Patient presents with   Arm Pain    Hospital Course:  Thomas Moore is a 68 year old male with CAD status post CABG in 1997, hypertension, diabetes, history of CVA, CKD stage IIIa, ischemic cardiomyopathy with a EF of 30%, A-fib on Eliquis , aortic stenosis status post TAVR, seizure disorder.  Patient presents to the ED on 11/30 with sudden left arm pain which she reports is consistent with all prior episodes of MI.  He took 2 aspirin  and nitroglycerin  prior to arrival.  In the ED patient was found to have elevated troponin, EKG without new changes.  Cardiology was consulted.  Patient underwent left heart cath 12/1 which revealed diffuse disease and graft of circumflex.  Subjective: Patient was seen prior to cath this morning.  He denies any chest pain at this time.   Objective: Vitals:   05/05/24 1345 05/05/24 1400 05/05/24 1415 05/05/24 1430  BP: (!) 103/49 (!) 100/43 (!) 107/56 92/74  Pulse: 71 67 69 70  Resp: 17 (!) 21 20 20   Temp:      TempSrc:      SpO2: 95% 92% 91% 95%  Weight:      Height:        Intake/Output Summary (Last 24 hours) at 05/05/2024 1529 Last data filed at 05/05/2024 1227 Gross per 24 hour  Intake 100.79 ml  Output 1525 ml  Net -1424.21 ml   Filed Weights   05/04/24 1310  Weight: 71.4 kg    Examination: General exam: Appears calm and comfortable, NAD  Respiratory system: No work of breathing, symmetric chest wall expansion Cardiovascular system: S1 & S2 heard, RRR.  Gastrointestinal system: Abdomen is nondistended, soft and nontender.  Neuro: Alert and oriented. No focal neurological deficits. Extremities: Symmetric, expected ROM Skin: No rashes, lesions Psychiatry: Demonstrates appropriate judgement and insight. Mood & affect appropriate for situation.   Assessment & Plan:  Principal Problem:   NSTEMI (non-ST  elevation myocardial infarction) (HCC)   NSTEMI CAD status post CABG Aortic stenosis status post TAVR - LHC today: Diffuse disease and graft to left circumflex.  Planning for medical management - Initiated on Imdur  - Monitor overnight on telemetry - Continue aspirin  and Plavix   Heart failure with reduced EF -Clinically euvolemic - Continue home dose furosemide   Hypertension - Has recently had low blood pressures - Monitor BP closely, has been started on Imdur .  This may be limited by hypotension.  Hyperlipidemia - Continue statin  CKD stage IIIa - Creatinine baseline near 1.6, some acute worsening today - Monitor closely with daily CMP - Renally dose medications when needed  History of pulmonary embolism - Chronically on Eliquis .  Eliquis  on hold for today and lieu of heparin  drip. - Resume when cleared by cardiology  History of CVA Seizure disorder - Continue home meds Depakote , aspirin , statin  Diabetes - Most recent hemoglobin A1c 6.5% - Continue with Jardiance  and sliding scale for now  Thrombocytopenia - Chronic, stable.  COPD without acute exacerbation - Continue home meds  Body mass index is 27.88 kg/m. - Outpatient follow up for lifestyle modification and risk factor management   DVT prophylaxis: Currently on heparin  drip   Code Status: Full Code Disposition: Inpatient pending clinical resolution, may be able to DC tomorrow  Consultants:    Procedures:  LHC today 12/1  Antimicrobials:  Anti-infectives (From admission, onward)    None  Data Reviewed: I have personally reviewed following labs and imaging studies CBC: Recent Labs  Lab 05/04/24 1315 05/05/24 0354  WBC 7.7 5.8  HGB 10.6* 10.2*  HCT 33.5* 31.7*  MCV 92.3 91.6  PLT 118* 112*   Basic Metabolic Panel: Recent Labs  Lab 05/04/24 1315 05/05/24 0354  NA 138 137  K 4.3 4.1  CL 102 103  CO2 22 22  GLUCOSE 141* 122*  BUN 29* 31*  CREATININE 1.52* 1.92*  CALCIUM   8.5* 8.6*   GFR: Estimated Creatinine Clearance: 32.7 mL/min (A) (by C-G formula based on SCr of 1.92 mg/dL (H)). Liver Function Tests: Recent Labs  Lab 05/04/24 1315  AST 52*  ALT 30  ALKPHOS 130*  BILITOT 0.6  PROT 7.9  ALBUMIN 3.9   CBG: Recent Labs  Lab 05/05/24 1428  GLUCAP 83    No results found for this or any previous visit (from the past 240 hours).   Radiology Studies: CARDIAC CATHETERIZATION Result Date: 05/05/2024   Mid LAD lesion is 80% stenosed.   Prox Cx lesion is 90% stenosed.   Ost RCA to Prox RCA lesion is 100% stenosed.   Origin lesion is 100% stenosed.   1st Diag lesion is 80% stenosed.   1st Mrg lesion is 80% stenosed.   Prox Graft lesion is 75% stenosed.   Mid Graft lesion is 50% stenosed.   Mid Graft to Dist Graft lesion is 70% stenosed.   Dist Cx lesion is 80% stenosed.   Origin lesion is 50% stenosed.   Dist LAD lesion is 80% stenosed.   Mid LM to Prox LAD lesion is 75% stenosed.   Prox LAD lesion is 50% stenosed. Conclusion Left heart cath with grafts impression right femoral artery Left ventriculogram was not performed because of elevated creatinine and renal insufficiency Coronaries Left main large distal 75 LAD large 75 ostial 50% proximal 100% mid Diagonal 1 large 95% ostial Circumflex 100% ostial RCA 100% ostial Grafts LIMA to mid LAD widely patent SVG to circumflex diffusely diseased 75 to 50% proximal to mid TIMI-3 flow SVG to RCA 100% occluded at origin Intervention deferred Recommend conservative medical therapy Mynx deployed right femoral artery  DG Chest Portable 1 View Result Date: 05/04/2024 CLINICAL DATA:  Chest pain and left arm pain radiating to the shoulder. EXAM: PORTABLE CHEST 1 VIEW COMPARISON:  03/19/2024 FINDINGS: Artifact overlies the chest. Previous median sternotomy and CABG. Previous TAVR. Loop recorder in place. Pulmonary venous hypertension with possible minimal interstitial edema. No advanced or alveolar edema. No visible effusion.  No lobar consolidation or collapse. IMPRESSION: Previous CABG and TAVR. Pulmonary venous hypertension with possible minimal interstitial edema. Electronically Signed   By: Oneil Officer M.D.   On: 05/04/2024 13:50    Scheduled Meds:  [MAR Hold] aspirin  EC  81 mg Oral Daily   [MAR Hold] atorvastatin   80 mg Oral QHS   [MAR Hold] clopidogrel   75 mg Oral Daily   [MAR Hold] divalproex   250 mg Oral QHS   [MAR Hold] empagliflozin   25 mg Oral Daily   [MAR Hold] fluticasone  furoate-vilanterol  1 puff Inhalation Daily   [MAR Hold] insulin  glargine-yfgn  10 Units Subcutaneous Daily   [MAR Hold] metoprolol  succinate  12.5 mg Oral Daily   sodium chloride  flush  3 mL Intravenous Q12H   [MAR Hold] torsemide   20 mg Oral Daily   Continuous Infusions:  sodium chloride      sodium chloride      heparin  600 Units/hr (05/05/24 9367)  LOS: 1 day  MDM: Patient is high risk for one or more organ failure.  They necessitate ongoing hospitalization for continued IV therapies and subsequent lab monitoring. Total time spent interpreting labs and vitals, reviewing the medical record, coordinating care amongst consultants and care team members, directly assessing and discussing care with the patient and/or family: 55 min  Nissi Doffing, DO [facility]  To contact the attending physician between 7A-7P please use Epic Chat. To contact the covering physician during after hours 7P-7A, please review Amion.  05/05/2024, 3:29 PM   *This document has been created with the assistance of dictation software. Please excuse typographical errors. *

## 2024-05-05 NOTE — ED Notes (Signed)
 Pt was consistently asking for help using the urinal saying his penis was too short. When this tech pulled the cover back to help the pt, his penis was erected.  He is able to stand by himself and use urinal by himself.   Per night shift he needs to use urinal by himself.

## 2024-05-05 NOTE — Telephone Encounter (Signed)
 Patient Product/process Development Scientist completed.    The patient is insured through [place]. Patient has Medicare and is not eligible for a copay card, but may be able to apply for patient assistance or Medicare RX Payment Plan (Patient Must reach out to their plan, if eligible for payment plan), if available.    Ran test claim for Breo Ellipta  100-25 mcg and the current 30 day co-pay is $0.00.   This test claim was processed through [facility]- copay amounts may vary at other pharmacies due to pharmacy/plan contracts, or as the patient moves through the different stages of their insurance plan.     Reyes Sharps, CPHT Pharmacy Technician Patient Advocate Specialist Lead [facility] Patient Advocate Team Direct Number: [phone]  Fax: [phone]

## 2024-05-05 NOTE — ED Notes (Signed)
 MD Mansy acknowledged awareness of critical APTT.SABRA  Per [place] in pharmacy, pause rate for 1 hour then restart at lower rate at 0615.  Orders to follow.

## 2024-05-05 NOTE — ED Notes (Signed)
 Report given to cath lab.

## 2024-05-05 NOTE — Consult Note (Signed)
 Pharmacy Consult Note - Anticoagulation  Pharmacy Consult for Heparin  infusion  Indication: chest pain/ACS No Known Allergies  PATIENT MEASUREMENTS: Height: 5' 3 (160 cm) Weight: 71.4 kg (157 lb 6.5 oz) IBW/kg (Calculated) : 56.9 HEPARIN  DW (KG): 71.2  VITAL SIGNS: Temp: 98.3 F (36.8 C) (12/01 0501) Temp Source: Oral (12/01 0501) BP: 121/79 (12/01 0500) Pulse Rate: 89 (12/01 0500)  Recent Labs    05/04/24 2240 05/05/24 0354  HGB  --  10.2*  HCT  --  31.7*  PLT  --  112*  APTT 86* >200*  LABPROT 15.8*  --   INR 1.2  --   HEPARINUNFRC >1.10*  --   CREATININE  --  1.92*    Estimated Creatinine Clearance: 32.7 mL/min (A) (by C-G formula based on SCr of 1.92 mg/dL (H)).  PAST MEDICAL HISTORY: Past Medical History:  Diagnosis Date   CAD (coronary artery disease) of artery bypass graft    s/p CABG x 4 in 1997   CHF (congestive heart failure) (HCC)    COPD (chronic obstructive pulmonary disease) (HCC)    Coronary artery disease    CVA (cerebral vascular accident) (HCC)    Diabetes mellitus without complication (HCC)    Dysrhythmia    GERD (gastroesophageal reflux disease)    HLD (hyperlipidemia)    Hypertension    S/P TAVR (transcatheter aortic valve replacement) 07/05/2021   with Edwards 26mm S3UR via TF approach with Dr. Wonda and Dr. Lucas   Seizures [facility])     Medications:  (Not in a hospital admission)  Scheduled:   aspirin   324 mg Oral NOW   Or   aspirin   300 mg Rectal NOW   aspirin  EC  81 mg Oral Daily   atorvastatin   80 mg Oral QHS   clopidogrel   75 mg Oral Daily   divalproex   250 mg Oral QHS   empagliflozin   25 mg Oral Daily   fluticasone  furoate-vilanterol  1 puff Inhalation Daily   insulin  glargine-yfgn  10 Units Subcutaneous Daily   metoprolol  succinate  12.5 mg Oral Daily   torsemide   20 mg Oral Daily   Infusions:   heparin  850 Units/hr (05/04/24 2315)   PRN: acetaminophen , nitroGLYCERIN   ASSESSMENT: 68 y.o. male with PMH CAD, CABG,  COPD, HTN, HLD, Aortic valve replacement and A.fib on Eliquis    is presenting with NSTEMI. Patient   Spoke with RN Camelia Eye and confirmed that the patient is unsure of when the last dose of his Eliquis  was taken. Pharmacy has been consulted to initiate and manage heparin  intravenous infusion.   Goal(s) of therapy: Heparin  level 0.3 - 0.7 units/mL aPTT 66 - 102 seconds Monitor platelets by anticoagulation protocol: Yes   Baseline anticoagulation labs: Recent Labs    05/04/24 1315 05/04/24 2240 05/05/24 0354  APTT  --  86* >200*  INR  --  1.2  --   HGB 10.6*  --  10.2*  PLT 118*  --  112*   Baseline aPTT/INR/and HL pending and may be elevated given prior  DOAC use.    11/30 2240 aPTT 86, therapeutic x 1 12/01 0354 aPTT >200, supratherapeutic  PLAN: Spoke with RN.  Lab drawn from opposite arm.  Hold for 1 hour.  Restart heparin  infusion at 600 units/hour. Recheck aPTT in 6 hours after restart Continue to titrate by aPTT until heparin  level and aPTT correlate, then titrate by heparin  level alone. Check heparin  level with next AM labs. Continue to monitor CBC daily while on  heparin  infusion.   Dail Lauraine RAMAN, PharmD Clinical Pharmacist 05/05/2024 5:17 AM

## 2024-05-05 NOTE — ED Notes (Signed)
 Pharmacy messaged about pt's inhaler

## 2024-05-05 NOTE — ED Notes (Signed)
Assisted pt with urinal use

## 2024-05-05 NOTE — ED Notes (Signed)
 Per lab critical APTT of >200.  MD Mansy notified.

## 2024-05-05 NOTE — OR Nursing (Signed)
 Pt sitiing. Noted drop in BP. Fluid bolus given. Slight increase. Pt layed flat and bp improved. Pt reported groin pain to palpation. Noted slight verticle ridge. Reported off this info to nurse Thom. Dr Florencio oked transfer to 2A.

## 2024-05-06 ENCOUNTER — Encounter: Payer: Self-pay | Admitting: Internal Medicine

## 2024-05-06 DIAGNOSIS — I214 Non-ST elevation (NSTEMI) myocardial infarction: Secondary | ICD-10-CM | POA: Diagnosis not present

## 2024-05-06 LAB — CREATININE, SERUM
Creatinine, Ser: 2.3 mg/dL — ABNORMAL HIGH (ref 0.61–1.24)
GFR, Estimated: 30 mL/min — ABNORMAL LOW (ref >60)

## 2024-05-06 LAB — CBC
HCT: 29.7 % — ABNORMAL LOW (ref 39.0–52.0)
Hemoglobin: 9.4 g/dL — ABNORMAL LOW (ref 13.0–17.0)
MCH: 29.2 pg (ref 26.0–34.0)
MCHC: 31.6 g/dL (ref 30.0–36.0)
MCV: 92.2 fL (ref 80.0–100.0)
Platelets: 105 K/uL — ABNORMAL LOW (ref 150–400)
RBC: 3.22 MIL/uL — ABNORMAL LOW (ref 4.22–5.81)
RDW: 15.9 % — ABNORMAL HIGH (ref 11.5–15.5)
WBC: 3.3 K/uL — ABNORMAL LOW (ref 4.0–10.5)
nRBC: 0 % (ref 0.0–0.2)

## 2024-05-06 LAB — GLUCOSE, CAPILLARY
Glucose-Capillary: 136 mg/dL — ABNORMAL HIGH (ref 70–99)
Glucose-Capillary: 123 mg/dL — ABNORMAL HIGH (ref 70–99)

## 2024-05-06 LAB — BASIC METABOLIC PANEL WITH GFR
Anion gap: 13 (ref 5–15)
BUN: 47 mg/dL — ABNORMAL HIGH (ref 8–23)
CO2: 25 mmol/L (ref 22–32)
Calcium: 8.3 mg/dL — ABNORMAL LOW (ref 8.9–10.3)
Chloride: 101 mmol/L (ref 98–111)
Creatinine, Ser: 2.39 mg/dL — ABNORMAL HIGH (ref 0.61–1.24)
GFR, Estimated: 29 mL/min — ABNORMAL LOW (ref >60)
Glucose, Bld: 114 mg/dL — ABNORMAL HIGH (ref 70–99)
Potassium: 4 mmol/L (ref 3.5–5.1)
Sodium: 139 mmol/L (ref 135–145)

## 2024-05-06 MED ORDER — APIXABAN 5 MG PO TABS
5.0000 mg | ORAL_TABLET | Freq: Two times a day (BID) | ORAL | Status: DC
  Administered 2024-05-06 – 2024-05-07 (×3): 5 mg via ORAL
  Filled 2024-05-06 (×3): qty 1

## 2024-05-06 MED ORDER — SODIUM CHLORIDE 0.9 % IV SOLN: INTRAVENOUS | Status: AC

## 2024-05-06 MED ORDER — TORSEMIDE 20 MG PO TABS
20.0000 mg | ORAL_TABLET | Freq: Every day | ORAL | Status: DC
  Administered 2024-05-07: 20 mg via ORAL
  Filled 2024-05-06: qty 1

## 2024-05-06 MED ORDER — INSULIN ASPART 100 UNIT/ML IJ SOLN
0.0000 [IU] | Freq: Three times a day (TID) | INTRAMUSCULAR | Status: DC
  Administered 2024-05-07: 2 [IU] via SUBCUTANEOUS
  Filled 2024-05-06: qty 2

## 2024-05-06 NOTE — Progress Notes (Signed)
 Heart Failure Navigator Progress Note  Assessed for Heart & Vascular TOC clinic readiness.  Patient does not meet criteria due to current [facility]iology patient.   Navigator will sign off at this time.  Roxy Horseman, RN, BSN [facility] Heart Failure Navigator Secure Chat Only
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
## 2024-05-06 NOTE — Progress Notes (Signed)
 PROGRESS NOTE    [name]  FMW:[id number] DOB: 1956-01-06 DOA: 05/04/2024 PCP: Lenon Layman ORN, MD  Chief Complaint  Patient presents with   Arm Pain    Hospital Course:  Thomas Moore is a 68 year old male with CAD status post CABG in 1997, hypertension, diabetes, history of CVA, CKD stage IIIa, ischemic cardiomyopathy with a EF of 30%, A-fib on Eliquis , aortic stenosis status post TAVR, seizure disorder.  Patient presents to the ED on 11/30 with sudden left arm pain which she reports is consistent with all prior episodes of MI.  He took 2 aspirin  and nitroglycerin  prior to arrival.  In the ED patient was found to have elevated troponin, EKG without new changes.  Cardiology was consulted.  Patient underwent left heart cath 12/1 which revealed diffuse disease and graft of circumflex. 12/2: No chest pain, worsening AKI.  Some hypotension.  Medications adjusted.  PT/OT ordered  Subjective: Patient continues to deny any chest pain this morning.  No acute events overnight  Objective: Vitals:   05/06/24 0000 05/06/24 0408 05/06/24 0823 05/06/24 1315  BP: (!) 92/54 (!) 106/54 (!) 110/57 (!) 102/50  Pulse: (!) 55 61 64 62  Resp:  17    Temp:  98.6 F (37 C) 97.6 F (36.4 C) 97.8 F (36.6 C)  TempSrc:      SpO2:  99% 95% 95%  Weight:      Height:        Intake/Output Summary (Last 24 hours) at 05/06/2024 1535 Last data filed at 05/06/2024 1030 Gross per 24 hour  Intake 200 ml  Output 450 ml  Net -250 ml   Filed Weights   05/04/24 1310  Weight: 71.4 kg    Examination: General exam: Appears calm and comfortable, NAD  Respiratory system: No work of breathing, symmetric chest wall expansion Cardiovascular system: S1 & S2 heard, RRR.  Gastrointestinal system: Abdomen is nondistended, soft and nontender.  Neuro: Alert and oriented. No focal neurological deficits. Extremities: Symmetric, expected ROM Skin: No rashes, lesions Psychiatry: Demonstrates appropriate  judgement and insight. Mood & affect appropriate for situation.   Assessment & Plan:  Principal Problem:   NSTEMI (non-ST elevation myocardial infarction) (HCC)   NSTEMI CAD status post CABG Aortic stenosis status post TAVR - LHC 12/1: Diffuse disease and graft to left circumflex.  Cardiology recommending medical management - Some hypotension today, continue to monitor closely on telemetry, titrate medication slowly - PT/OT ordered.  May need home health - Continue Plavix  and Eliquis .  Discontinue aspirin   Heart failure with reduced EF -Clinically euvolemic - Holding diuretics today given AKI.  Resume tomorrow if creatinine improved  Hypotension - Struggling with hypotension again today, does have history of this as well - Continue to monitor blood pressures closely.  Cardiology suggested Imdur  for symptomatic relief but does not appear that blood pressure can tolerate  Hyperlipidemia - Continue statin  AKI superimposed CKD stage IIIa - Creatinine baseline near 1.6, creatinine today 2.39 - Cardiology started patient on very gentle IV fluids.  Minimal improvement on creatinine this afternoon. - Encourage p.o. intake - Repeat CMP in a.m. - Renally dose medications when needed  History of pulmonary embolism - Chronically on Eliquis .  Recently held and lieu of heparin  drip.  Resume today  History of CVA Seizure disorder - Continue home meds Depakote , aspirin , statin  Diabetes - Most recent hemoglobin A1c 6.5% - Continue home meds Jardiance  and glargine for now. - Low-dose sliding scale.  Thrombocytopenia - Chronic,  stable.  COPD without acute exacerbation - Continue home meds  Body mass index is 27.88 kg/m. - Outpatient follow up for lifestyle modification and risk factor management   DVT prophylaxis: Eliquis    Code Status: Full Code Disposition: Inpatient pending clinical resolution.  May be able to DC tomorrow.  May need home health at DC.  Pending PT/OT  evals  Consultants:    Procedures:  LHC today 12/1  Antimicrobials:  Anti-infectives (From admission, onward)    None       Data Reviewed: I have personally reviewed following labs and imaging studies CBC: Recent Labs  Lab 05/04/24 1315 05/05/24 0354 05/06/24 0431  WBC 7.7 5.8 3.3*  HGB 10.6* 10.2* 9.4*  HCT 33.5* 31.7* 29.7*  MCV 92.3 91.6 92.2  PLT 118* 112* 105*   Basic Metabolic Panel: Recent Labs  Lab 05/04/24 1315 05/05/24 0354 05/06/24 0431 05/06/24 1241  NA 138 137 139  --   K 4.3 4.1 4.0  --   CL 102 103 101  --   CO2 22 22 25   --   GLUCOSE 141* 122* 114*  --   BUN 29* 31* 47*  --   CREATININE 1.52* 1.92* 2.39* 2.30*  CALCIUM  8.5* 8.6* 8.3*  --    GFR: Estimated Creatinine Clearance: 27.3 mL/min (A) (by C-G formula based on SCr of 2.3 mg/dL (H)). Liver Function Tests: Recent Labs  Lab 05/04/24 1315  AST 52*  ALT 30  ALKPHOS 130*  BILITOT 0.6  PROT 7.9  ALBUMIN 3.9   CBG: Recent Labs  Lab 05/05/24 1428  GLUCAP 83    No results found for this or any previous visit (from the past 240 hours).   Radiology Studies: ECHOCARDIOGRAM COMPLETE Result Date: 05/05/2024    ECHOCARDIOGRAM REPORT   Patient Name:   Thomas Moore Date of Exam: 05/05/2024 Medical Rec #:  [id number]      Height:       63.0 in Accession #:    [id number]     Weight:       157.4 lb Date of Birth:  [birth year]       BSA:          1.747 m Patient Age:    68 years       BP:           99/52 mmHg Patient Gender: M              HR:           76 bpm. Exam Location:  [facility] Procedure: 2D Echo, Cardiac Doppler and Color Doppler (Both Spectral and Color            Flow Doppler were utilized during procedure). Indications:     Chest pain R07.9  History:         Patient has prior history of Echocardiogram examinations, most                  recent 08/06/2023. CHF, CAD; Risk Factors:Diabetes. S/P TAVR.  Sonographer:     Christopher Furnace Referring Phys:  [id number] CARALYN HUDSON Diagnosing Phys: Cara JONETTA Lovelace MD IMPRESSIONS  1. Left ventricular ejection fraction, by estimation, is 35 to 40%. The left ventricle has moderately decreased function. The left ventricle demonstrates regional wall motion abnormalities (see scoring diagram/findings for description). The left ventricular internal cavity size was mildly dilated. There is mild concentric left ventricular hypertrophy. Left ventricular diastolic parameters are consistent with Grade III diastolic dysfunction (restrictive).  2. Right ventricular systolic function is mildly reduced. The right ventricular size is mildly enlarged. Mildly increased right ventricular wall thickness.  3. The mitral valve is normal in structure. No evidence of mitral valve regurgitation.  4. The aortic valve is normal in structure. Aortic valve regurgitation is not visualized. Aortic valve sclerosis is present, with no evidence of aortic valve stenosis. FINDINGS  Left Ventricle: Left ventricular ejection fraction, by estimation, is 35 to 40%. The left ventricle has moderately decreased function. The left ventricle demonstrates regional wall motion abnormalities. Strain was performed and the global longitudinal strain is indeterminate. The left ventricular internal cavity size was mildly dilated. There is mild concentric left ventricular hypertrophy. Left ventricular diastolic parameters are consistent with Grade III diastolic dysfunction (restrictive). Right Ventricle: The right ventricular size is mildly enlarged. Mildly increased right ventricular wall thickness. Right ventricular systolic function is mildly reduced. Left Atrium: Left atrial size was normal in size. Right Atrium: Right atrial size was normal in size. Pericardium: There is no evidence of pericardial effusion. Mitral Valve: The mitral valve is normal in structure. No evidence of mitral valve regurgitation. MV peak gradient, 10.4 mmHg. The mean mitral valve gradient is 3.0 mmHg. Tricuspid Valve: The tricuspid valve is  normal in structure. Tricuspid valve regurgitation is trivial. Aortic Valve: The aortic valve is normal in structure. Aortic valve regurgitation is not visualized. Aortic valve sclerosis is present, with no evidence of aortic valve stenosis. Aortic valve mean gradient measures 4.3 mmHg. Aortic valve peak gradient measures 7.3 mmHg. Aortic valve area, by VTI measures 1.88 cm. Pulmonic Valve: The pulmonic valve was normal in structure. Pulmonic valve regurgitation is not visualized. Aorta: The ascending aorta was not well visualized. IAS/Shunts: No atrial level shunt detected by color flow Doppler. Additional Comments: 3D was performed not requiring image post processing on an independent workstation and was indeterminate.  LEFT VENTRICLE PLAX 2D LVIDd:         5.30 cm      Diastology LVIDs:         4.20 cm      LV e' medial:    5.22 cm/s LV PW:         1.30 cm      LV E/e' medial:  24.1 LV IVS:        1.40 cm      LV e' lateral:   3.81 cm/s LVOT diam:     2.00 cm      LV E/e' lateral: 33.1 LV SV:         45 LV SV Index:   26 LVOT Area:     3.14 cm LV IVRT:       85 msec  LV Volumes (MOD) LV vol d, MOD A2C: 145.0 ml LV vol d, MOD A4C: 112.0 ml LV vol s, MOD A2C: 112.0 ml LV vol s, MOD A4C: 71.9 ml LV SV MOD A2C:     33.0 ml LV SV MOD A4C:     112.0 ml LV SV MOD BP:      41.8 ml RIGHT VENTRICLE RV Basal diam:  4.00 cm    PULMONARY VEINS RV Mid diam:    3.20 cm    Systolic Velocity: 66.00 cm/s RV S prime:     6.85 cm/s TAPSE (M-mode): 1.8 cm LEFT ATRIUM             Index        RIGHT ATRIUM  Index LA diam:        3.80 cm 2.18 cm/m   RA Area:     13.40 cm LA Vol (A2C):   36.8 ml 21.07 ml/m  RA Volume:   31.70 ml  18.15 ml/m LA Vol (A4C):   32.2 ml 18.44 ml/m LA Biplane Vol: 36.7 ml 21.01 ml/m  AORTIC VALVE AV Area (Vmax):    1.62 cm AV Area (Vmean):   1.62 cm AV Area (VTI):     1.88 cm AV Vmax:           134.67 cm/s AV Vmean:          94.467 cm/s AV VTI:            0.237 m AV Peak Grad:      7.3 mmHg  AV Mean Grad:      4.3 mmHg LVOT Vmax:         69.50 cm/s LVOT Vmean:        48.700 cm/s LVOT VTI:          0.142 m LVOT/AV VTI ratio: 0.60  AORTA Ao Root diam: 3.10 cm MITRAL VALVE                TRICUSPID VALVE MV Area (PHT): 3.34 cm     TR Peak grad:   11.3 mmHg MV Area VTI:   1.19 cm     TR Vmax:        168.00 cm/s MV Peak grad:  10.4 mmHg MV Mean grad:  3.0 mmHg     SHUNTS MV Vmax:       1.61 m/s     Systemic VTI:  0.14 m MV Vmean:      82.6 cm/s    Systemic Diam: 2.00 cm MV Decel Time: 227 msec MV E velocity: 126.00 cm/s MV A velocity: 62.40 cm/s MV E/A ratio:  2.02 Cara JONETTA Lovelace MD Electronically signed by Cara JONETTA Lovelace MD Signature Date/Time: 05/05/2024/9:26:05 PM    Final    CARDIAC CATHETERIZATION Result Date: 05/05/2024   Mid LAD lesion is 80% stenosed.   Prox Cx lesion is 90% stenosed.   Ost RCA to Prox RCA lesion is 100% stenosed.   Origin lesion is 100% stenosed.   1st Diag lesion is 80% stenosed.   1st Mrg lesion is 80% stenosed.   Prox Graft lesion is 75% stenosed.   Mid Graft lesion is 50% stenosed.   Mid Graft to Dist Graft lesion is 70% stenosed.   Dist Cx lesion is 80% stenosed.   Origin lesion is 50% stenosed.   Dist LAD lesion is 80% stenosed.   Mid LM to Prox LAD lesion is 75% stenosed.   Prox LAD lesion is 50% stenosed. Conclusion Left heart cath with grafts impression right femoral artery Left ventriculogram was not performed because of elevated creatinine and renal insufficiency Coronaries Left main large distal 75 LAD large 75 ostial 50% proximal 100% mid Diagonal 1 large 95% ostial Circumflex 100% ostial RCA 100% ostial Grafts LIMA to mid LAD widely patent SVG to circumflex diffusely diseased 75 to 50% proximal to mid TIMI-3 flow SVG to RCA 100% occluded at origin Intervention deferred Recommend conservative medical therapy Mynx deployed right femoral artery   Scheduled Meds:  apixaban   5 mg Oral BID   atorvastatin   80 mg Oral QHS   clopidogrel   75 mg Oral Daily    divalproex   250 mg Oral QHS   empagliflozin   25 mg Oral Daily  feeding supplement  237 mL Oral BID BM   fluticasone  furoate-vilanterol  1 puff Inhalation Daily   insulin  glargine-yfgn  10 Units Subcutaneous Daily   metoprolol  succinate  12.5 mg Oral Daily   sodium chloride  flush  3 mL Intravenous Q12H   [START ON 05/07/2024] torsemide   20 mg Oral Daily   Continuous Infusions:  sodium chloride        LOS: 2 days  MDM: Patient is high risk for one or more organ failure.  They necessitate ongoing hospitalization for continued IV therapies and subsequent lab monitoring. Total time spent interpreting labs and vitals, reviewing the medical record, coordinating care amongst consultants and care team members, directly assessing and discussing care with the patient and/or family: 55 min  Cherry Turlington, DO [facility]  To contact the attending physician between 7A-7P please use Epic Chat. To contact the covering physician during after hours 7P-7A, please review Amion.  05/06/2024, 3:35 PM   *This document has been created with the assistance of dictation software. Please excuse typographical errors. *

## 2024-05-07 ENCOUNTER — Other Ambulatory Visit: Payer: Self-pay

## 2024-05-07 DIAGNOSIS — I214 Non-ST elevation (NSTEMI) myocardial infarction: Secondary | ICD-10-CM | POA: Diagnosis not present

## 2024-05-07 LAB — BASIC METABOLIC PANEL WITH GFR
Anion gap: 11 (ref 5–15)
BUN: 50 mg/dL — ABNORMAL HIGH (ref 8–23)
CO2: 24 mmol/L (ref 22–32)
Calcium: 8.5 mg/dL — ABNORMAL LOW (ref 8.9–10.3)
Chloride: 102 mmol/L (ref 98–111)
Creatinine, Ser: 1.98 mg/dL — ABNORMAL HIGH (ref 0.61–1.24)
GFR, Estimated: 36 mL/min — ABNORMAL LOW (ref >60)
Glucose, Bld: 122 mg/dL — ABNORMAL HIGH (ref 70–99)
Potassium: 4 mmol/L (ref 3.5–5.1)
Sodium: 137 mmol/L (ref 135–145)

## 2024-05-07 LAB — CBC
HCT: 30.1 % — ABNORMAL LOW (ref 39.0–52.0)
Hemoglobin: 9.7 g/dL — ABNORMAL LOW (ref 13.0–17.0)
MCH: 29.2 pg (ref 26.0–34.0)
MCHC: 32.2 g/dL (ref 30.0–36.0)
MCV: 90.7 fL (ref 80.0–100.0)
Platelets: 110 K/uL — ABNORMAL LOW (ref 150–400)
RBC: 3.32 MIL/uL — ABNORMAL LOW (ref 4.22–5.81)
RDW: 15.7 % — ABNORMAL HIGH (ref 11.5–15.5)
WBC: 4.4 K/uL (ref 4.0–10.5)
nRBC: 0 % (ref 0.0–0.2)

## 2024-05-07 LAB — GLUCOSE, CAPILLARY
Glucose-Capillary: 112 mg/dL — ABNORMAL HIGH (ref 70–99)
Glucose-Capillary: 211 mg/dL — ABNORMAL HIGH (ref 70–99)

## 2024-05-07 MED ORDER — AMIODARONE HCL 100 MG PO TABS
200.0000 mg | ORAL_TABLET | Freq: Every day | ORAL | 0 refills | Status: DC
  Filled 2024-05-07 (×2): qty 30, 15d supply, fill #0

## 2024-05-07 MED ORDER — ISOSORBIDE MONONITRATE ER 30 MG PO TB24
15.0000 mg | ORAL_TABLET | Freq: Every day | ORAL | Status: DC
  Administered 2024-05-07: 15 mg via ORAL
  Filled 2024-05-07: qty 1

## 2024-05-07 MED ORDER — ISOSORBIDE MONONITRATE ER 30 MG PO TB24
15.0000 mg | ORAL_TABLET | Freq: Every day | ORAL | 0 refills | Status: DC
  Filled 2024-05-07: qty 30, 60d supply, fill #0

## 2024-05-07 NOTE — Evaluation (Signed)
 Physical Therapy Evaluation Patient Details Name: Thomas Moore MRN: [id number] DOB: July 23, 1955 Today's Date: 05/07/2024  History of Present Illness  Thomas Moore is a 68 y.o. male  with a past medical history of coronary artery disease s/p CABG (1997), chronic HFrEF (30-35% - 08/2023), paroxysmal atrial fibrillation (on Eliquis ), hypertension, hyperlipidemia, diabetes, history stroke, history of SVT, CKD stage IIIa, aortic stenosis s/p TAVR, history of thrombocytopenia, bilateral carotid artery stenosis, s/p loop recorder implant  who presented to the ED on 05/04/2024 for left arm pain similar to prior MIs.   Clinical Impression  Pt admitted with above diagnosis. Pt currently with functional limitations due to the deficits listed below (see PT Problem List). Pt received upright in bed agreeable to PT. PTA pt lives alone and is mod-I using his RW or rollator and indep with ADL's. Relies on public transportation for community errands.   To date, pt is CGA for supine to sitting EOB. Able to stand to RW at supervision and complete ~200' of ambulation with step through pattern. VSS throughout. Pt's gait speed indicative of low falls for limited community distances however pt does have notable LE weakness as evidenced by 5xSTS for age matched norms. Anticipate pt is close to baseline. Pt in recliner with all needs in reach. Pt will benefit from skilled PT services to reduce falls risk and maximize return to PLOF.       If plan is discharge home, recommend the following: A little help with walking and/or transfers;A little help with bathing/dressing/bathroom;Assistance with cooking/housework;Assist for transportation   Can travel by private vehicle        Equipment Recommendations None recommended by PT  Recommendations for Other Services       Functional Status Assessment Patient has had a recent decline in their functional status and demonstrates the ability to make significant improvements  in function in a reasonable and predictable amount of time.     Precautions / Restrictions Precautions Precautions: Fall Recall of Precautions/Restrictions: Intact Restrictions Weight Bearing Restrictions Per Provider Order: No      Mobility  Bed Mobility Overal bed mobility: Needs Assistance Bed Mobility: Supine to Sit     Supine to sit: HOB elevated, Used rails, Contact guard       Patient Response: Cooperative  Transfers Overall transfer level: Needs assistance   Transfers: Sit to/from Stand Sit to Stand: Supervision           General transfer comment: Able to stand without AD    Ambulation/Gait Ambulation/Gait assistance: Supervision Gait Distance (Feet): 200 Feet Assistive device: Rolling walker (2 wheels) Gait Pattern/deviations: Step-through pattern Gait velocity: 10' in 6 seconds = 1.66'/second Gait velocity interpretation: 1.31 - 2.62 ft/sec, indicative of limited community ambulator   General Gait Details: reports close to baseline cadence  Stairs            Wheelchair Mobility     Tilt Bed Tilt Bed Patient Response: Cooperative  Modified Rankin (Stroke Patients Only)       Balance Overall balance assessment: Needs assistance Sitting-balance support: No upper extremity supported, Feet supported Sitting balance-Leahy Scale: Good       Standing balance-Leahy Scale: Fair                               Pertinent Vitals/Pain Pain Assessment Pain Assessment: No/denies pain    Home Living Family/patient expects to be discharged to:: Private residence Living Arrangements: Alone Available  Help at Discharge: Other (Comment) (ex-wife sometimes assists if needed?) Type of Home: Apartment Home Access: Level entry       Home Layout: One level Home Equipment: Agricultural Consultant (2 wheels);Rollator (4 wheels);Grab bars - toilet;Grab bars - tub/shower      Prior Function Prior Level of Function : Independent/Modified  Independent             Mobility Comments: amb no AD in apartment. Uses RW or 4WW outside for community walking distances. ADLs Comments: Indep with ADL's     Extremity/Trunk Assessment   Upper Extremity Assessment Upper Extremity Assessment: Defer to OT evaluation    Lower Extremity Assessment Lower Extremity Assessment: Generalized weakness       Communication        Cognition Arousal: Alert Behavior During Therapy: WFL for tasks assessed/performed   PT - Cognitive impairments: No apparent impairments                         Following commands: Intact       Cueing Cueing Techniques: Verbal cues     General Comments General comments (skin integrity, edema, etc.): VSS throughout    Exercises Other Exercises Other Exercises: 5xSTS: 17.92 seconds with BUE support needed. Indicative of LE weakness for age matched norms (11.4 seconds for 6th decade of life)   Assessment/Plan    PT Assessment Patient needs continued PT services  PT Problem List Decreased strength;Decreased mobility;Decreased activity tolerance;Decreased balance       PT Treatment Interventions DME instruction;Therapeutic exercise;Gait training;Balance training;Neuromuscular re-education;Functional mobility training;Therapeutic activities;Patient/family education    PT Goals (Current goals can be found in the Care Plan section)  Acute Rehab PT Goals Patient Stated Goal: to go home PT Goal Formulation: With patient Time For Goal Achievement: 05/21/24 Potential to Achieve Goals: Good    Frequency Min 2X/week     Co-evaluation               AM-PAC PT 6 Clicks Mobility  Outcome Measure Help needed turning from your back to your side while in a flat bed without using bedrails?: A Little Help needed moving from lying on your back to sitting on the side of a flat bed without using bedrails?: A Little Help needed moving to and from a bed to a chair (including a wheelchair)?:  A Little Help needed standing up from a chair using your arms (e.g., wheelchair or bedside chair)?: A Little Help needed to walk in hospital room?: A Little Help needed climbing 3-5 steps with a railing? : A Lot 6 Click Score: 17    End of Session Equipment Utilized During Treatment: Gait belt Activity Tolerance: Patient tolerated treatment well Patient left: in chair;with call bell/phone within reach;with chair alarm set Nurse Communication: Mobility status PT Visit Diagnosis: Other abnormalities of gait and mobility (R26.89);Muscle weakness (generalized) (M62.81)    Time: 9093-9064 PT Time Calculation (min) (ACUTE ONLY): 29 min   Charges:   PT Evaluation $PT Eval Low Complexity: 1 Low PT Treatments $Therapeutic Activity: 8-22 mins PT General Charges $$ ACUTE PT VISIT: 1 Visit        Dorina HERO. Fairly IV, PT, DPT Physical Therapist- [facility] [place] 05/07/2024, 11:57 AM
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
## 2024-05-07 NOTE — Progress Notes (Signed)
 Mobility Specialist - Progress Note   05/07/24 1400  Mobility  Activity Respositioned in chair;Stood at bedside  Level of Assistance Independent after set-up  Assistive Device None  Distance Ambulated (ft) 4 ft  Range of Motion/Exercises Active;All extremities  Activity Response Tolerated well  Mobility visit 1 Mobility  Mobility Specialist Start Time (ACUTE ONLY) 1426  Mobility Specialist Stop Time (ACUTE ONLY) 1442  Mobility Specialist Time Calculation (min) (ACUTE ONLY) 16 min   Pt was in the recliner on RA upon entry. Pt agreed to mobility. Pt states that he will be discharged soon. Pt is able to STS independently with VC. Pt is able to ambulate well within room. OT entered the room to begin session. Pt is in the recliner with needs in reach and chair alarm on.  Clem Rodes Mobility Specialist 05/07/24, 2:49 PM

## 2024-05-07 NOTE — Care Management Important Message (Signed)
 Important Message  Patient Details  Name: Thomas Moore MRN: [id number] Date of Birth: [birth year]   Important Message Given:  Yes - Medicare IM     Jaxon Flatt W, CMA 05/07/2024, 12:39 PM

## 2024-05-07 NOTE — TOC Transition Note (Signed)
 Transition of Care [facility]) - Discharge Note   Patient Details  Name: Thomas Moore MRN: [id number] Date of Birth: Oct 17, 1955  Transition of Care [facility]) CM/SW Contact:  Alfonso Rummer, LCSW Phone Number: 05/07/2024, 2:24 PM   Clinical Narrative:    Pt will discharge home with home health pt providing services. TOC will provide pt a taxi voucher. No further toc needs identified.    Final next level of care: Home w Home Health Services Barriers to Discharge: Barriers Resolved   Patient Goals and CMS Choice Patient states their goals for this hospitalization and ongoing recovery are:: return home   Choice offered to / list presented to : Patient      Discharge Placement                       Discharge Plan and Services Additional resources added to the After Visit Summary for   In-house Referral: Clinical Social Work Discharge Planning Services: CM Consult Post Acute Care Choice: Home Health                    HH Arranged: PT          Social Drivers of Health (SDOH) Interventions SDOH Screenings   Food Insecurity: No Food Insecurity (05/05/2024)  Housing: Low Risk  (05/05/2024)  Transportation Needs: No Transportation Needs (05/05/2024)  Utilities: Not At Risk (05/05/2024)  Financial Resource Strain: Low Risk  (03/06/2024)   Received from [facility]  Social Connections: Moderately Integrated (05/05/2024)  Stress: No Stress Concern Present (02/19/2020)  Tobacco Use: Medium Risk (05/04/2024)     Readmission Risk Interventions    05/06/2024   11:17 AM  Readmission Risk Prevention Plan  Transportation Screening Complete  PCP or Specialist Appt within 3-5 Days Complete  Social Work Consult for Recovery Care Planning/Counseling Complete  Medication Review Oceanographer) Complete

## 2024-05-07 NOTE — Discharge Instructions (Addendum)
 Please check your blood pressure twice a day--once in the morning and once at bedtime--record your readings in a log, and bring it to your next appointment with your primary care provider to help guide any needed changes to your treatment.    [facility] will contact patient to schedule home assessment to begin home health physical therapy. Should you have questions please contact [facility] [phone]

## 2024-05-07 NOTE — Discharge Summary (Signed)
 Physician Discharge Summary   Patient: Thomas Moore MRN: [id number] DOB: 10-21-1955  Admit date:     05/04/2024  Discharge date: 05/07/24  Discharge Physician: Duffy Al-Sultani   PCP: Lenon Layman ORN, MD   Recommendations at discharge:   Follow up with PCP in 1 week for routine post-hospital follow up and review ambulatory BP log and adjust antihypertensives as indicated Repeat BMP in 2-3 days to reassess electrolytes/renal function Follow up with Cardiology, Dr. Fernand, as scheduled  Discharge Diagnoses: Principal Problem:   NSTEMI (non-ST elevation myocardial infarction) [facility]) Active Problems:   CAD (coronary artery disease)   Atrial fibrillation, chronic (HCC)   Chronic combined systolic and diastolic CHF (congestive heart failure) (HCC)   Essential hypertension   HLD (hyperlipidemia)   Long term (current) use of anticoagulants   Type II diabetes mellitus with renal manifestations (HCC)   COPD (chronic obstructive pulmonary disease) (HCC)   Chronic kidney disease, stage 3a [facility])    Hospital Course:  The patient is 68 year old male with past medical history of CAD s/p CABG (1997), ICM, chronic HFrEF (30-35%), paroxysmal atrial fibrillation (on Eliquis ), aortic stenosis s/p TAVR, s/p ILD, seizure disorder, HTN, HLD, T2DM, history of stroke, history of SVT, CKD stage IIIa, who presented to the ED on 05/04/2024 complaining of acute onset left arm pain similar to prior episodes of myocardial infarction.  He reportedly took 2 tablets of aspirin  and nitroglycerin  without improvement in his symptoms, prompting presentation to the ER.  Workup in the ED was notable for Hgb 10.6, K4.3, creatinine 1.52.  High-sensitivity troponins 149 > 1503. EKG showed sinus rhythm without acute ST segment changes.  He was started on a heparin  drip in the ED and admitted for NSTEMI.  NSTEMI CAD status post CABG Aortic stenosis status post TAVR Paroxysmal Afib Chronic HFrEF Primary  hypertension Hypotension - Underwent LHC 12/1: Diffuse disease of SVG to OM. Recommended medical management.  - Medical therapy initially limited by low blood pressures. Cardiology recommended continuation of Plavix  75 mg daily, Eliquis  5 mg twice daily, Lipitor  80 mg daily, metoprolol  succinate open 5 mg daily, Jardiance  25 mg daily, and resuming home Imdur  at a decreased dose of 15 mg daily to be uptitrated as outpatient. Discussed home amiodarone  dosage with Dr. Florencio prior to discharge, who recommended reduction to amiodarone  100 mg daily for now. Diuretics were initially held due to AKI but resumed after improvement in creatinine. Entresto  was held at discharge as the patient's blood pressure remained low-normal off therapy. The patient was instructed to monitor blood pressure twice daily, maintain a log, and bring it to their follow-up appointment. - Follow up with outpatient primary cardiologist Dr. Fernand set up by cardiology team   Hyperlipidemia - Continue statin   AKI on CKD stage IIIa - Creatinine baseline near 1.6, was elevated to 2.39 during hospitalization  - Cardiology started patient on very gentle IV fluids and the patient was encouraged to improve p.o. intake - Creatinine improved to 1.98 on the day of discharge - Patient was instructed to follow up with PCP within 1 week of discharge to ensure continued down trending of creatinine/complete resolution   History of pulmonary embolism - Continue Eliquis    History of CVA Seizure disorder - Continue home meds Depakote , aspirin , statin   Diabetes - Most recent hemoglobin A1c 6.5% - Resumed on home regimen at discharge    Thrombocytopenia - Chronic, stable.   COPD without acute exacerbation - Continue home meds  Consultants: Cardiology Procedures performed: [facility] 12/1  Disposition: Home Diet recommendation:  Diet Orders (From admission, onward)     Start     Ordered   05/05/24 1545  Diet 2 gram sodium  Fluid consistency: Thin  Diet effective now       Question:  Fluid consistency:  Answer:  Thin   05/05/24 1544            DISCHARGE MEDICATION: Allergies as of 05/07/2024   No Known Allergies      Medication List     PAUSE taking these medications    sacubitril -valsartan  24-26 MG Wait to take this until your doctor or other care provider tells you to start again. Commonly known as: Entresto  Take 1 tablet by mouth 2 (two) times daily.       STOP taking these medications    carvedilol  3.125 MG tablet Commonly known as: COREG    fluticasone -salmeterol 100-50 MCG/ACT Aepb Commonly known as: ADVAIR       TAKE these medications    acetaminophen  650 MG CR tablet Commonly known as: TYLENOL  Take 1,300 mg by mouth every 8 (eight) hours as needed for pain.   amiodarone  100 MG tablet Commonly known as: Pacerone  Take 2 tablets (200 mg total) by mouth daily. What changed:  medication strength when to take this   apixaban  5 MG Tabs tablet Commonly known as: ELIQUIS  Take 1 tablet (5 mg total) by mouth 2 (two) times daily.   atorvastatin  80 MG tablet Commonly known as: LIPITOR  Take 1 tablet (80 mg total) by mouth at bedtime.   CALCIUM  + D PO Take 1 tablet by mouth 2 (two) times daily.   clopidogrel  75 MG tablet Commonly known as: PLAVIX  Take 1 tablet by mouth once daily   cyanocobalamin  1000 MCG tablet Commonly known as: VITAMIN B12 Take 1 tablet (1,000 mcg total) by mouth daily.   cyclopentolate 1 % ophthalmic solution Commonly known as: CYCLODRYL,CYCLOGYL Place 1 drop into the left eye 2 (two) times daily.   diclofenac Sodium 1 % Gel Commonly known as: VOLTAREN Apply 4 g topically 4 (four) times daily.   divalproex  250 MG DR tablet Commonly known as: DEPAKOTE  Take 250 mg by mouth at bedtime.   erythromycin ophthalmic ointment Place 1 Application into both eyes 3 (three) times daily.   insulin  degludec 100 UNIT/ML FlexTouch Pen Commonly known as:  TRESIBA Inject 10 Units into the skin daily.   isosorbide  mononitrate 30 MG 24 hr tablet Commonly known as: IMDUR  Take 0.5 tablets (15 mg total) by mouth daily. Start taking on: May 08, 2024 What changed: how much to take   Jardiance  25 MG Tabs tablet Generic drug: empagliflozin  Take 25 mg by mouth daily.   levothyroxine 50 MCG tablet Commonly known as: SYNTHROID Take 50 mcg by mouth daily before breakfast.   metoprolol  succinate 25 MG 24 hr tablet Commonly known as: Toprol  XL Take 0.5 tablets (12.5 mg total) by mouth daily.   moxifloxacin 0.5 % ophthalmic solution Commonly known as: VIGAMOX Place 1 drop into the left eye 3 (three) times daily.   multivitamin capsule Take 1 capsule by mouth daily.   mupirocin ointment 2 % Commonly known as: BACTROBAN Apply 1 Application topically 2 (two) times daily.   nitroGLYCERIN  0.4 MG SL tablet Commonly known as: NITROSTAT  Place 1 tablet (0.4 mg total) under the tongue every 5 (five) minutes as needed for chest pain.   ofloxacin 0.3 % ophthalmic solution Commonly known as: OCUFLOX Place 1 drop  into the left eye 4 (four) times daily.   Oxervate 0.002 % Soln Generic drug: Cenegermin-bkbj Place 1 drop into both eyes 6 (six) times daily.   prednisoLONE acetate 1 % ophthalmic suspension Commonly known as: PRED FORTE Place 1 drop into the left eye 4 (four) times daily.   torsemide  20 MG tablet Commonly known as: DEMADEX  Take 1 tablet (20 mg total) by mouth daily.   Trulicity  4.5 MG/0.5ML Soaj Generic drug: Dulaglutide  Inject 4.5 mg into the skin once a week.        Contact information for follow-up providers     Fernand Denyse LABOR, MD. Go in 1 week(s).   Specialty: Cardiology Contact information: [address] [place] [address] [phone]         Lenon Layman ORN, MD. Schedule an appointment as soon as possible for a visit in 1 week(s).   Specialty: Internal Medicine Contact information: [address] [facility] [place] [address] [phone]              Contact information for after-discharge care     Home Medical Care     [facility]) .   Service: Home Health Services Contact information: [address] [place]  [address] [phone]                     Discharge Exam: [name]   05/04/24 1310  Weight: 71.4 kg   Blood pressure (!) 106/55, pulse 60, temperature (!) 97.5 F (36.4 C), resp. rate 16, height 5' 3 (1.6 m), weight 71.4 kg, SpO2 100%.   Gen: NAD, A&Ox3 HEENT: NCAT, PERRLA, EOMI Neck: Supple, no JVD, no LAD CV: RRR, no murmurs Resp: normal WOB, CTAB, no w/r/r Abd: Soft, NTND, no guarding, BS normoactive Ext: No LE edema, pulses 2+ b/l, right groin access site mildly tender Skin: Warm, dry, no rashes/lesions Neuro: No focal deficits Psych: Calm, cooperative, appropriate affect   Condition at discharge: good  The results of significant diagnostics from this hospitalization (including imaging, microbiology, ancillary and laboratory) are listed below for reference.   Imaging Studies: ECHOCARDIOGRAM COMPLETE Result Date: 05/05/2024    ECHOCARDIOGRAM REPORT   Patient Name:   NOLLAN MULDROW Ihde Date of Exam: 05/05/2024 Medical Rec #:  [id number]      Height:       63.0 in Accession #:    [id number]     Weight:       157.4 lb Date of Birth:  1956/03/03       BSA:          1.747 m Patient Age:    68 years       BP:           99/52 mmHg Patient Gender: M              HR:           76 bpm. Exam Location:  [facility] Procedure: 2D Echo, Cardiac Doppler and Color Doppler (Both Spectral and Color            Flow Doppler were utilized during procedure). Indications:     Chest pain R07.9  History:         Patient has prior history of Echocardiogram examinations, most                  recent 08/06/2023. CHF, CAD; Risk Factors:Diabetes. S/P TAVR.  Sonographer:     Christopher Furnace  Referring Phys:  [id number] CARALYN HUDSON Diagnosing Phys: Cara JONETTA Lovelace MD IMPRESSIONS  1. Left ventricular ejection fraction, by estimation, is 35 to 40%. The left ventricle has moderately decreased function. The left ventricle demonstrates regional wall motion abnormalities (see scoring diagram/findings for description). The left ventricular internal cavity size was mildly dilated. There is mild concentric left ventricular hypertrophy. Left ventricular diastolic parameters are consistent with Grade III diastolic dysfunction (restrictive).  2. Right ventricular systolic function is mildly reduced. The right ventricular size is mildly enlarged. Mildly increased right ventricular wall thickness.  3. The mitral valve is normal in structure. No evidence of mitral valve regurgitation.  4. The aortic valve is normal in structure. Aortic valve regurgitation is not visualized. Aortic valve sclerosis is present, with no evidence of aortic valve stenosis. FINDINGS  Left Ventricle: Left ventricular ejection fraction, by estimation, is 35 to 40%. The left ventricle has moderately decreased function. The left ventricle demonstrates regional wall motion abnormalities. Strain was performed and the global longitudinal strain is indeterminate. The left ventricular internal cavity size was mildly dilated. There is mild concentric left ventricular hypertrophy. Left ventricular diastolic parameters are consistent with Grade III diastolic dysfunction (restrictive). Right Ventricle: The right ventricular size is mildly enlarged. Mildly increased right ventricular wall thickness. Right ventricular systolic function is mildly reduced. Left Atrium: Left atrial size was normal in size. Right Atrium: Right atrial size was normal in size. Pericardium: There is no evidence of pericardial effusion. Mitral Valve: The mitral valve is normal in structure. No evidence of mitral valve regurgitation. MV peak gradient, 10.4 mmHg. The mean mitral  valve gradient is 3.0 mmHg. Tricuspid Valve: The tricuspid valve is normal in structure. Tricuspid valve regurgitation is trivial. Aortic Valve: The aortic valve is normal in structure. Aortic valve regurgitation is not visualized. Aortic valve sclerosis is present, with no evidence of aortic valve stenosis. Aortic valve mean gradient measures 4.3 mmHg. Aortic valve peak gradient measures 7.3 mmHg. Aortic valve area, by VTI measures 1.88 cm. Pulmonic Valve: The pulmonic valve was normal in structure. Pulmonic valve regurgitation is not visualized. Aorta: The ascending aorta was not well visualized. IAS/Shunts: No atrial level shunt detected by color flow Doppler. Additional Comments: 3D was performed not requiring image post processing on an independent workstation and was indeterminate.  LEFT VENTRICLE PLAX 2D LVIDd:         5.30 cm      Diastology LVIDs:         4.20 cm      LV e' medial:    5.22 cm/s LV PW:         1.30 cm      LV E/e' medial:  24.1 LV IVS:        1.40 cm      LV e' lateral:   3.81 cm/s LVOT diam:     2.00 cm      LV E/e' lateral: 33.1 LV SV:         45 LV SV Index:   26 LVOT Area:     3.14 cm LV IVRT:       85 msec  LV Volumes (MOD) LV vol d, MOD A2C: 145.0 ml LV vol d, MOD A4C: 112.0 ml LV vol s, MOD A2C: 112.0 ml LV vol s, MOD A4C: 71.9 ml LV SV MOD A2C:     33.0 ml LV SV MOD A4C:     112.0 ml LV SV MOD  BP:      41.8 ml RIGHT VENTRICLE RV Basal diam:  4.00 cm    PULMONARY VEINS RV Mid diam:    3.20 cm    Systolic Velocity: 66.00 cm/s RV S prime:     6.85 cm/s TAPSE (M-mode): 1.8 cm LEFT ATRIUM             Index        RIGHT ATRIUM           Index LA diam:        3.80 cm 2.18 cm/m   RA Area:     13.40 cm LA Vol (A2C):   36.8 ml 21.07 ml/m  RA Volume:   31.70 ml  18.15 ml/m LA Vol (A4C):   32.2 ml 18.44 ml/m LA Biplane Vol: 36.7 ml 21.01 ml/m  AORTIC VALVE AV Area (Vmax):    1.62 cm AV Area (Vmean):   1.62 cm AV Area (VTI):     1.88 cm AV Vmax:           134.67 cm/s AV Vmean:           94.467 cm/s AV VTI:            0.237 m AV Peak Grad:      7.3 mmHg AV Mean Grad:      4.3 mmHg LVOT Vmax:         69.50 cm/s LVOT Vmean:        48.700 cm/s LVOT VTI:          0.142 m LVOT/AV VTI ratio: 0.60  AORTA Ao Root diam: 3.10 cm MITRAL VALVE                TRICUSPID VALVE MV Area (PHT): 3.34 cm     TR Peak grad:   11.3 mmHg MV Area VTI:   1.19 cm     TR Vmax:        168.00 cm/s MV Peak grad:  10.4 mmHg MV Mean grad:  3.0 mmHg     SHUNTS MV Vmax:       1.61 m/s     Systemic VTI:  0.14 m MV Vmean:      82.6 cm/s    Systemic Diam: 2.00 cm MV Decel Time: 227 msec MV E velocity: 126.00 cm/s MV A velocity: 62.40 cm/s MV E/A ratio:  2.02 Cara JONETTA Lovelace MD Electronically signed by Cara JONETTA Lovelace MD Signature Date/Time: 05/05/2024/9:26:05 PM    Final    CARDIAC CATHETERIZATION Result Date: 05/05/2024   Mid LAD lesion is 80% stenosed.   Prox Cx lesion is 90% stenosed.   Ost RCA to Prox RCA lesion is 100% stenosed.   Origin lesion is 100% stenosed.   1st Diag lesion is 80% stenosed.   1st Mrg lesion is 80% stenosed.   Prox Graft lesion is 75% stenosed.   Mid Graft lesion is 50% stenosed.   Mid Graft to Dist Graft lesion is 70% stenosed.   Dist Cx lesion is 80% stenosed.   Origin lesion is 50% stenosed.   Dist LAD lesion is 80% stenosed.   Mid LM to Prox LAD lesion is 75% stenosed.   Prox LAD lesion is 50% stenosed. Conclusion Left heart cath with grafts impression right femoral artery Left ventriculogram was not performed because of elevated creatinine and renal insufficiency Coronaries Left main large distal 75 LAD large 75 ostial 50% proximal 100% mid Diagonal 1 large 95% ostial Circumflex 100% ostial RCA 100%  ostial Grafts LIMA to mid LAD widely patent SVG to circumflex diffusely diseased 75 to 50% proximal to mid TIMI-3 flow SVG to RCA 100% occluded at origin Intervention deferred Recommend conservative medical therapy Mynx deployed right femoral artery  DG Chest Portable 1 View Result Date:  05/04/2024 CLINICAL DATA:  Chest pain and left arm pain radiating to the shoulder. EXAM: PORTABLE CHEST 1 VIEW COMPARISON:  03/19/2024 FINDINGS: Artifact overlies the chest. Previous median sternotomy and CABG. Previous TAVR. Loop recorder in place. Pulmonary venous hypertension with possible minimal interstitial edema. No advanced or alveolar edema. No visible effusion. No lobar consolidation or collapse. IMPRESSION: Previous CABG and TAVR. Pulmonary venous hypertension with possible minimal interstitial edema. Electronically Signed   By: Oneil Officer M.D.   On: 05/04/2024 13:50   CUP PACEART REMOTE DEVICE CHECK Result Date: 04/21/2024 ILR summary report received. Battery status OK. Normal device function. No new symptom, tachy, brady, or pause episodes. No new AF episodes. Monthly summary reports and ROV/PRN - CS, CVRS   Microbiology: Results for orders placed or performed during the hospital encounter of 08/04/23  MRSA Next Gen by PCR, Nasal     Status: Abnormal   Collection Time: 08/06/23  6:35 AM   Specimen: Nasal Mucosa; Nasal Swab  Result Value Ref Range Status   MRSA by PCR Next Gen DETECTED (A) NOT DETECTED Final    Comment: RESULT CALLED TO, READ BACK BY AND VERIFIED WITH: DONNICE SILVAN RN [phone] 08/06/23 HNM (NOTE) The GeneXpert MRSA Assay (FDA approved for NASAL specimens only), is one component of a comprehensive MRSA colonization surveillance program. It is not intended to diagnose MRSA infection nor to guide or monitor treatment for MRSA infections. Test performance is not FDA approved in patients less than 65 years old. Performed at [facility], [address] [place], [place] [address]     Labs: CBC: Recent Labs  Lab 05/04/24 1315 05/05/24 0354 05/06/24 0431 05/07/24 0353  WBC 7.7 5.8 3.3* 4.4  HGB 10.6* 10.2* 9.4* 9.7*  HCT 33.5* 31.7* 29.7* 30.1*  MCV 92.3 91.6 92.2 90.7  PLT 118* 112* 105* 110*   Basic Metabolic Panel: Recent Labs  Lab  05/04/24 1315 05/05/24 0354 05/06/24 0431 05/06/24 1241 05/07/24 0746  NA 138 137 139  --  137  K 4.3 4.1 4.0  --  4.0  CL 102 103 101  --  102  CO2 22 22 25   --  24  GLUCOSE 141* 122* 114*  --  122*  BUN 29* 31* 47*  --  50*  CREATININE 1.52* 1.92* 2.39* 2.30* 1.98*  CALCIUM  8.5* 8.6* 8.3*  --  8.5*   Liver Function Tests: Recent Labs  Lab 05/04/24 1315  AST 52*  ALT 30  ALKPHOS 130*  BILITOT 0.6  PROT 7.9  ALBUMIN 3.9   CBG: Recent Labs  Lab 05/05/24 1428 05/06/24 1726 05/06/24 2155 05/07/24 0826 05/07/24 1136  GLUCAP 83 136* 123* 112* 211*    Discharge time spent: Time Coordinating Discharge: I spent a total of 35 minutes engaged in face-to-face discussion with the patient and/or caregivers regarding the patient's care, assessment, plan, and discharge disposition. Over 50% of this time was dedicated to counseling the patient on the risks and benefits of treatment options and the discharge plan, as well as coordinating post-discharge care.   Signed: Nakira Litzau Al-Sultani, MD [facility] 05/07/2024

## 2024-05-07 NOTE — Evaluation (Signed)
 Occupational Therapy Evaluation Patient Details Name: Thomas Moore MRN: [id number] DOB: [birth year] Today's Date: 05/07/2024   History of Present Illness   Thomas Moore is a 68 y.o. male  with a past medical history of coronary artery disease s/p CABG (1997), chronic HFrEF (30-35% - 08/2023), paroxysmal atrial fibrillation (on Eliquis ), hypertension, hyperlipidemia, diabetes, history stroke, history of SVT, CKD stage IIIa, aortic stenosis s/p TAVR, history of thrombocytopenia, bilateral carotid artery stenosis, s/p loop recorder implant  who presented to the ED on 05/04/2024 for left arm pain similar to prior MIs.     Clinical Impressions Upon entering the room, pt seated in recliner chair and agreeable to OT evaluation. Pt reports living in an apartment and being Ind with self care and using RW vs rollator in community. Pt does not use any AD in home. Pt is very hard of hearing and very verbose requiring some redirection to task during session. Pt able to dress UB at mod I level and dons mesh brief and jeans with belt with supervision with sit <>stand. No LOB noted this session. Pt ambulating in room without issue. Pt uses insurance for transportation. Pt with no further skilled acute OT intervention at this time.       Functional Status Assessment   Patient has not had a recent decline in their functional status     Equipment Recommendations   None recommended by OT      Precautions/Restrictions   Precautions Precautions: Fall Recall of Precautions/Restrictions: Intact     Mobility Bed Mobility               General bed mobility comments: seated in recliner chair    Transfers Overall transfer level: Modified independent                        Balance Overall balance assessment: Modified Independent   Sitting balance-Leahy Scale: Normal     Standing balance support: No upper extremity supported Standing balance-Leahy Scale: Good                              ADL either performed or assessed with clinical judgement   ADL                                         General ADL Comments: Mod I for UB self care and supervision for LB self care with sit <>stand and no use of AD     Vision Patient Visual Report: No change from baseline              Pertinent Vitals/Pain Pain Assessment Pain Assessment: No/denies pain     Extremity/Trunk Assessment Upper Extremity Assessment Upper Extremity Assessment: Overall WFL for tasks assessed   Lower Extremity Assessment Lower Extremity Assessment: Overall WFL for tasks assessed       Communication Communication Communication: Impaired Factors Affecting Communication: Hearing impaired   Cognition Arousal: Alert Behavior During Therapy: WFL for tasks assessed/performed Cognition: No apparent impairments                               Following commands: Intact                  Home Living Family/patient expects to be discharged  to:: Private residence Living Arrangements: Alone Available Help at Discharge: Other (Comment) (ex wife assists if needed) Type of Home: Apartment Home Access: Level entry     Home Layout: One level     Bathroom Shower/Tub: Chief Strategy Officer: Standard Bathroom Accessibility: Yes How Accessible: Accessible via walker Home Equipment: Rolling Walker (2 wheels);Rollator (4 wheels);Grab bars - toilet;Grab bars - tub/shower          Prior Functioning/Environment Prior Level of Function : Independent/Modified Independent             Mobility Comments: amb no AD in apartment. Uses RW or 4WW outside for community walking distances. ADLs Comments: Indep with ADL's and IADLs     AM-PAC OT 6 Clicks Daily Activity     Outcome Measure Help from another person eating meals?: None Help from another person taking care of personal grooming?: None Help from another person toileting,  which includes using toliet, bedpan, or urinal?: None Help from another person bathing (including washing, rinsing, drying)?: None Help from another person to put on and taking off regular upper body clothing?: None Help from another person to put on and taking off regular lower body clothing?: None 6 Click Score: 24   End of Session Nurse Communication: Mobility status  Activity Tolerance: Patient tolerated treatment well Patient left: in chair;with call bell/phone within reach                   Time: 1440-1500 OT Time Calculation (min): 20 min Charges:  OT General Charges $OT Visit: 1 Visit OT Evaluation $OT Eval Low Complexity: 1 Low OT Treatments $Self Care/Home Management : 8-22 mins  Izetta Claude, MS, OTR/L , CBIS ascom [phone]  05/07/24, 3:22 PM

## 2024-05-16 ENCOUNTER — Ambulatory Visit: Admitting: Cardiovascular Disease

## 2024-05-16 ENCOUNTER — Encounter: Payer: Self-pay | Admitting: Cardiovascular Disease

## 2024-05-16 VITALS — BP 148/68 | HR 87 | Ht 63.0 in | Wt 144.0 lb

## 2024-05-16 DIAGNOSIS — R0789 Other chest pain: Secondary | ICD-10-CM

## 2024-05-16 DIAGNOSIS — I5042 Chronic combined systolic (congestive) and diastolic (congestive) heart failure: Secondary | ICD-10-CM | POA: Diagnosis not present

## 2024-05-16 DIAGNOSIS — I252 Old myocardial infarction: Secondary | ICD-10-CM

## 2024-05-16 DIAGNOSIS — I249 Acute ischemic heart disease, unspecified: Secondary | ICD-10-CM | POA: Diagnosis not present

## 2024-05-16 DIAGNOSIS — I214 Non-ST elevation (NSTEMI) myocardial infarction: Secondary | ICD-10-CM

## 2024-05-16 DIAGNOSIS — I480 Paroxysmal atrial fibrillation: Secondary | ICD-10-CM

## 2024-05-16 DIAGNOSIS — N179 Acute kidney failure, unspecified: Secondary | ICD-10-CM

## 2024-05-16 DIAGNOSIS — R55 Syncope and collapse: Secondary | ICD-10-CM | POA: Diagnosis not present

## 2024-05-16 DIAGNOSIS — I2510 Atherosclerotic heart disease of native coronary artery without angina pectoris: Secondary | ICD-10-CM

## 2024-05-16 DIAGNOSIS — R0602 Shortness of breath: Secondary | ICD-10-CM | POA: Diagnosis not present

## 2024-05-16 NOTE — Progress Notes (Signed)
 Cardiology Office Note   Date:  05/16/2024   ID:  Thomas Moore, DOB 11-12-1955, MRN [id number]  PCP:  Lenon Layman ORN, MD  Cardiologist:  Denyse Bathe, MD      History of Present Illness: Thomas Moore is a 68 y.o. male who presents for  Chief Complaint  Patient presents with   Follow-up    Hospital follow up    Had chest pain and arm pains.  He went to ER and had cath.      Past Medical History:  Diagnosis Date   CAD (coronary artery disease) of artery bypass graft    s/p CABG x 4 in 1997   CHF (congestive heart failure) (HCC)    COPD (chronic obstructive pulmonary disease) (HCC)    Coronary artery disease    CVA (cerebral vascular accident) (HCC)    Diabetes mellitus without complication (HCC)    Dysrhythmia    GERD (gastroesophageal reflux disease)    HLD (hyperlipidemia)    Hypertension    S/P TAVR (transcatheter aortic valve replacement) 07/05/2021   with Edwards 26mm S3UR via TF approach with Dr. Wonda and Dr. Lucas   Seizures [facility])      Past Surgical History:  Procedure Laterality Date   BYPASS GRAFT ANGIOGRAPHY N/A 04/25/2021   Procedure: BYPASS GRAFT ANGIOGRAPHY;  Surgeon: Lawyer Bernardino Cough, MD;  Location: [facility] INVASIVE CV LAB;  Service: Cardiovascular;  Laterality: N/A;   CARDIAC SURGERY     CORNEAL TRANSPLANT Right    CORONARY ARTERY BYPASS GRAFT  1997   ELECTROPHYSIOLOGY STUDY N/A 09/08/2021   Procedure: ELECTROPHYSIOLOGY STUDY;  Surgeon: Cindie Ole DASEN, MD;  Location: [facility] INVASIVE CV LAB;  Service: Cardiovascular;  Laterality: N/A;   EYE SURGERY     FLEXOR TENDON REPAIR Left 07/11/2019   Procedure: FLEXOR tenolysis  REPAIR LEFT RING FINGER with tednon repair;  Surgeon: Kathlynn Sharper, MD;  Location: [facility] ORS;  Service: Orthopedics;  Laterality: Left;   INCISION AND DRAINAGE ABSCESS Left 05/08/2019   Procedure: INCISION AND DRAINAGE ABSCESS;  Surgeon: Mardee Lynwood SQUIBB, MD;  Location: [facility] ORS;  Service: Orthopedics;  Laterality: Left;    INTRAOPERATIVE TRANSTHORACIC ECHOCARDIOGRAM N/A 07/05/2021   Procedure: INTRAOPERATIVE TRANSTHORACIC ECHOCARDIOGRAM;  Surgeon: Wonda Sharper, MD;  Location: [facility] OR;  Service: Open Heart Surgery;  Laterality: N/A;   LEFT HEART CATH AND CORONARY ANGIOGRAPHY Left 06/09/2022   Procedure: LEFT HEART CATH AND CORONARY ANGIOGRAPHY;  Surgeon: Bathe Denyse LABOR, MD;  Location: [facility] INVASIVE CV LAB;  Service: Cardiovascular;  Laterality: Left;   LEFT HEART CATH AND CORONARY ANGIOGRAPHY Left 09/27/2023   Procedure: LEFT HEART CATH AND CORONARY ANGIOGRAPHY with possible coronary intervention;  Surgeon: Bathe Denyse LABOR, MD;  Location: [facility] INVASIVE CV LAB;  Service: Cardiovascular;  Laterality: Left;   LEFT HEART CATH AND CORS/GRAFTS ANGIOGRAPHY N/A 10/02/2019   Procedure: LEFT HEART CATH AND CORS/GRAFTS ANGIOGRAPHY;  Surgeon: Anner Alm ORN, MD;  Location: [facility] INVASIVE CV LAB;  Service: Cardiovascular;  Laterality: N/A;   LEFT HEART CATH AND CORS/GRAFTS ANGIOGRAPHY N/A 07/17/2022   Procedure: LEFT HEART CATH AND CORS/GRAFTS ANGIOGRAPHY and possible PCI and stent;  Surgeon: Bathe Denyse LABOR, MD;  Location: [facility] INVASIVE CV LAB;  Service: Cardiovascular;  Laterality: N/A;   LEFT HEART CATH AND CORS/GRAFTS ANGIOGRAPHY N/A 05/05/2024   Procedure: LEFT HEART CATH AND CORS/GRAFTS ANGIOGRAPHY;  Surgeon: Florencio Cara BIRCH, MD;  Location: [facility] INVASIVE CV LAB;  Service: Cardiovascular;  Laterality: N/A;   LOOP RECORDER INSERTION N/A 10/06/2019  Procedure: LOOP RECORDER INSERTION;  Surgeon: Waddell Danelle ORN, MD;  Location: [facility] INVASIVE CV LAB;  Service: Cardiovascular;  Laterality: N/A;   TRANSCATHETER AORTIC VALVE REPLACEMENT, TRANSFEMORAL N/A 07/05/2021   Procedure: TRANSCATHETER AORTIC VALVE REPLACEMENT, TRANSFEMORAL;  Surgeon: Wonda Sharper, MD;  Location: [facility] OR;  Service: Open Heart Surgery;  Laterality: N/A;   TRIGGER FINGER RELEASE Left      Current Outpatient Medications  Medication Sig Dispense Refill    acetaminophen  (TYLENOL ) 650 MG CR tablet Take 1,300 mg by mouth every 8 (eight) hours as needed for pain.     amiodarone  (PACERONE ) 100 MG tablet Take 2 tablets (200 mg total) by mouth daily. 60 tablet 0   apixaban  (ELIQUIS ) 5 MG TABS tablet Take 1 tablet (5 mg total) by mouth 2 (two) times daily. 180 tablet 1   atorvastatin  (LIPITOR ) 80 MG tablet Take 1 tablet (80 mg total) by mouth at bedtime. 30 tablet 0   Calcium  Citrate-Vitamin D  (CALCIUM  + D PO) Take 1 tablet by mouth 2 (two) times daily.     clopidogrel  (PLAVIX ) 75 MG tablet Take 1 tablet by mouth once daily 30 tablet 0   cyanocobalamin  (VITAMIN B12) 1000 MCG tablet Take 1 tablet (1,000 mcg total) by mouth daily. 90 tablet 1   cyclopentolate (CYCLODRYL,CYCLOGYL) 1 % ophthalmic solution Place 1 drop into the left eye 2 (two) times daily.     diclofenac Sodium (VOLTAREN) 1 % GEL Apply 4 g topically 4 (four) times daily.     divalproex  (DEPAKOTE ) 250 MG DR tablet Take 250 mg by mouth at bedtime.     erythromycin ophthalmic ointment Place 1 Application into both eyes 3 (three) times daily.     insulin  degludec (TRESIBA ) 100 UNIT/ML FlexTouch Pen Inject 10 Units into the skin daily.     isosorbide  mononitrate (IMDUR ) 30 MG 24 hr tablet Take 0.5 tablets (15 mg total) by mouth daily. 30 tablet 0   JARDIANCE  25 MG TABS tablet Take 25 mg by mouth daily.     levothyroxine (SYNTHROID) 50 MCG tablet Take 50 mcg by mouth daily before breakfast.     metoprolol  succinate (TOPROL  XL) 25 MG 24 hr tablet Take 0.5 tablets (12.5 mg total) by mouth daily. 15 tablet 11   moxifloxacin (VIGAMOX) 0.5 % ophthalmic solution Place 1 drop into the left eye 3 (three) times daily.     Multiple Vitamin (MULTIVITAMIN) capsule Take 1 capsule by mouth daily.     mupirocin ointment (BACTROBAN) 2 % Apply 1 Application topically 2 (two) times daily.     nitroGLYCERIN  (NITROSTAT ) 0.4 MG SL tablet Place 1 tablet (0.4 mg total) under the tongue every 5 (five) minutes as needed for  chest pain. 30 tablet 2   ofloxacin (OCUFLOX) 0.3 % ophthalmic solution Place 1 drop into the left eye 4 (four) times daily.     OXERVATE 0.002 % SOLN Place 1 drop into both eyes 6 (six) times daily.     prednisoLONE acetate (PRED FORTE) 1 % ophthalmic suspension Place 1 drop into the left eye 4 (four) times daily.     torsemide  (DEMADEX ) 20 MG tablet Take 1 tablet (20 mg total) by mouth daily. 30 tablet 0   TRULICITY  4.5 MG/0.5ML SOAJ Inject 4.5 mg into the skin once a week.     [Paused] sacubitril -valsartan  (ENTRESTO ) 24-26 MG Take 1 tablet by mouth 2 (two) times daily. 180 tablet 1   No current facility-administered medications for this visit.   Facility-Administered Medications Ordered in  Other Visits  Medication Dose Route Frequency Provider Last Rate Last Admin   sodium chloride  flush (NS) 0.9 % injection 3 mL  3 mL Intravenous Q12H Fernand Denyse LABOR, MD        Allergies:   Patient has no known allergies.    Social History:   reports that he quit smoking about 41 years ago. His smoking use included cigarettes. He has never used smokeless tobacco. He reports that he does not currently use alcohol. He reports that he does not use drugs.   Family History:  family history includes Seizures in his brother.    ROS:     Review of Systems  Constitutional: Negative.   HENT: Negative.    Eyes: Negative.   Respiratory: Negative.    Gastrointestinal: Negative.   Genitourinary: Negative.   Musculoskeletal: Negative.   Skin: Negative.   Neurological: Negative.   Endo/Heme/Allergies: Negative.   Psychiatric/Behavioral: Negative.    All other systems reviewed and are negative.     All other systems are reviewed and negative.    PHYSICAL EXAM: VS:  BP (!) 148/68   Pulse 87   Ht 5' 3 (1.6 m)   Wt 144 lb (65.3 kg)   SpO2 91%   BMI 25.51 kg/m  , BMI Body mass index is 25.51 kg/m. Last weight:  Wt Readings from Last 3 Encounters:  05/16/24 144 lb (65.3 kg)  05/04/24 157 lb 6.5  oz (71.4 kg)  04/21/24 141 lb (64 kg)     Physical Exam Vitals reviewed.  Constitutional:      Appearance: Normal appearance. He is normal weight.  HENT:     Head: Normocephalic.     Nose: Nose normal.     Mouth/Throat:     Mouth: Mucous membranes are moist.  Eyes:     Pupils: Pupils are equal, round, and reactive to light.  Cardiovascular:     Rate and Rhythm: Normal rate and regular rhythm.     Pulses: Normal pulses.     Heart sounds: Normal heart sounds.  Pulmonary:     Effort: Pulmonary effort is normal.  Abdominal:     General: Abdomen is flat. Bowel sounds are normal.  Musculoskeletal:        General: Normal range of motion.     Cervical back: Normal range of motion.  Skin:    General: Skin is warm.  Neurological:     General: No focal deficit present.     Mental Status: He is alert.  Psychiatric:        Mood and Affect: Mood normal.       EKG:   Recent Labs: 03/19/2024: B Natriuretic Peptide 1,635.7 03/20/2024: Magnesium  2.3 05/04/2024: ALT 30 05/07/2024: BUN 50; Creatinine, Ser 1.98; Hemoglobin 9.7; Platelets 110; Potassium 4.0; Sodium 137    Lipid Panel    Component Value Date/Time   CHOL 157 05/05/2024 0353   CHOL 143 02/25/2020 1126   TRIG 83 05/05/2024 0353   HDL 37 (L) 05/05/2024 0353   HDL 31 (L) 02/25/2020 1126   CHOLHDL 4.3 05/05/2024 0353   VLDL 17 05/05/2024 0353   LDLCALC 104 (H) 05/05/2024 0353   LDLCALC 78 02/25/2020 1126      Other studies Reviewed: Additional studies/ records that were reviewed today include:  Review of the above records demonstrates:       No data to display            ASSESSMENT AND PLAN:    ICD-10-CM  1. SOB (shortness of breath)  R06.02     2. AKI (acute kidney injury)  N17.9    creat 1.98    3. Paroxysmal atrial fibrillation (HCC)  I48.0     4. Syncope, unspecified syncope type  R55     5. Coronary artery disease with hx of myocardial infarct w/o hx of CABG  I25.10    I25.2     6.  Other chest pain  R07.89     7. Non-ST elevation (NSTEMI) myocardial infarction [facility])  I21.4    Admitted to [facility] 05/05/24 with chest pain with troponins 1500. SVT to OM 75-80 % disease diffuse TIMI 3 flow, decided treat medically. Imdur /jurdiance/plavix .    8. ACS (acute coronary syndrome) (HCC)  I24.9     9. Chronic combined systolic and diastolic CHF (congestive heart failure) (HCC)  I50.42    LVEF 35 % on GDMT entresto        Problem List Items Addressed This Visit       Cardiovascular and Mediastinum   Non-ST elevation (NSTEMI) myocardial infarction [facility])   Coronary artery disease with hx of myocardial infarct w/o hx of CABG   Paroxysmal atrial fibrillation (HCC)   ACS (acute coronary syndrome) (HCC)   Chronic combined systolic and diastolic CHF (congestive heart failure) (HCC)     Genitourinary   AKI (acute kidney injury)     Other   Chest pain   SOB (shortness of breath) - Primary   Syncope       Disposition:   Return in about 4 weeks (around 06/13/2024).    Total time spent: 35 minutes  Signed,  Denyse Bathe, MD  05/16/2024 2:54 PM    [facility]

## 2024-05-19 ENCOUNTER — Emergency Department

## 2024-05-19 ENCOUNTER — Other Ambulatory Visit: Payer: Self-pay

## 2024-05-19 ENCOUNTER — Inpatient Hospital Stay
Admission: EM | Admit: 2024-05-19 | Discharge: 2024-05-24 | DRG: 280 | Disposition: A | Discharge status: Skilled Nursing Facility | Attending: Internal Medicine | Admitting: Internal Medicine

## 2024-05-19 DIAGNOSIS — E1122 Type 2 diabetes mellitus with diabetic chronic kidney disease: Secondary | ICD-10-CM | POA: Diagnosis present

## 2024-05-19 DIAGNOSIS — I2510 Atherosclerotic heart disease of native coronary artery without angina pectoris: Secondary | ICD-10-CM | POA: Diagnosis present

## 2024-05-19 DIAGNOSIS — Z7985 Long-term (current) use of injectable non-insulin antidiabetic drugs: Secondary | ICD-10-CM | POA: Diagnosis not present

## 2024-05-19 DIAGNOSIS — I130 Hypertensive heart and chronic kidney disease with heart failure and stage 1 through stage 4 chronic kidney disease, or unspecified chronic kidney disease: Secondary | ICD-10-CM | POA: Diagnosis present

## 2024-05-19 DIAGNOSIS — Z86711 Personal history of pulmonary embolism: Secondary | ICD-10-CM | POA: Diagnosis present

## 2024-05-19 DIAGNOSIS — Z947 Corneal transplant status: Secondary | ICD-10-CM

## 2024-05-19 DIAGNOSIS — E785 Hyperlipidemia, unspecified: Secondary | ICD-10-CM | POA: Diagnosis present

## 2024-05-19 DIAGNOSIS — G40909 Epilepsy, unspecified, not intractable, without status epilepticus: Secondary | ICD-10-CM | POA: Diagnosis present

## 2024-05-19 DIAGNOSIS — Z794 Long term (current) use of insulin: Secondary | ICD-10-CM

## 2024-05-19 DIAGNOSIS — Z7901 Long term (current) use of anticoagulants: Secondary | ICD-10-CM

## 2024-05-19 DIAGNOSIS — Z7902 Long term (current) use of antithrombotics/antiplatelets: Secondary | ICD-10-CM

## 2024-05-19 DIAGNOSIS — I214 Non-ST elevation (NSTEMI) myocardial infarction: Secondary | ICD-10-CM | POA: Diagnosis present

## 2024-05-19 DIAGNOSIS — K219 Gastro-esophageal reflux disease without esophagitis: Secondary | ICD-10-CM | POA: Diagnosis present

## 2024-05-19 DIAGNOSIS — I5043 Acute on chronic combined systolic (congestive) and diastolic (congestive) heart failure: Secondary | ICD-10-CM | POA: Diagnosis present

## 2024-05-19 DIAGNOSIS — Z79890 Hormone replacement therapy (postmenopausal): Secondary | ICD-10-CM | POA: Diagnosis not present

## 2024-05-19 DIAGNOSIS — Z952 Presence of prosthetic heart valve: Secondary | ICD-10-CM

## 2024-05-19 DIAGNOSIS — J42 Unspecified chronic bronchitis: Secondary | ICD-10-CM | POA: Diagnosis not present

## 2024-05-19 DIAGNOSIS — Z87898 Personal history of other specified conditions: Secondary | ICD-10-CM

## 2024-05-19 DIAGNOSIS — Z7984 Long term (current) use of oral hypoglycemic drugs: Secondary | ICD-10-CM | POA: Diagnosis not present

## 2024-05-19 DIAGNOSIS — E039 Hypothyroidism, unspecified: Secondary | ICD-10-CM | POA: Diagnosis present

## 2024-05-19 DIAGNOSIS — J449 Chronic obstructive pulmonary disease, unspecified: Secondary | ICD-10-CM | POA: Diagnosis present

## 2024-05-19 DIAGNOSIS — I480 Paroxysmal atrial fibrillation: Secondary | ICD-10-CM | POA: Diagnosis present

## 2024-05-19 DIAGNOSIS — Z560 Unemployment, unspecified: Secondary | ICD-10-CM

## 2024-05-19 DIAGNOSIS — N1832 Chronic kidney disease, stage 3b: Secondary | ICD-10-CM | POA: Diagnosis present

## 2024-05-19 DIAGNOSIS — Z8673 Personal history of transient ischemic attack (TIA), and cerebral infarction without residual deficits: Secondary | ICD-10-CM | POA: Diagnosis not present

## 2024-05-19 DIAGNOSIS — D649 Anemia, unspecified: Secondary | ICD-10-CM | POA: Diagnosis present

## 2024-05-19 DIAGNOSIS — I25810 Atherosclerosis of coronary artery bypass graft(s) without angina pectoris: Secondary | ICD-10-CM | POA: Diagnosis present

## 2024-05-19 DIAGNOSIS — Z87442 Personal history of urinary calculi: Secondary | ICD-10-CM

## 2024-05-19 DIAGNOSIS — Z87891 Personal history of nicotine dependence: Secondary | ICD-10-CM | POA: Diagnosis not present

## 2024-05-19 DIAGNOSIS — R079 Chest pain, unspecified: Principal | ICD-10-CM

## 2024-05-19 DIAGNOSIS — R55 Syncope and collapse: Secondary | ICD-10-CM | POA: Diagnosis not present

## 2024-05-19 DIAGNOSIS — Z79899 Other long term (current) drug therapy: Secondary | ICD-10-CM | POA: Diagnosis not present

## 2024-05-19 DIAGNOSIS — E1129 Type 2 diabetes mellitus with other diabetic kidney complication: Secondary | ICD-10-CM | POA: Diagnosis present

## 2024-05-19 DIAGNOSIS — I10 Essential (primary) hypertension: Secondary | ICD-10-CM | POA: Diagnosis present

## 2024-05-19 LAB — CBC
HCT: 31.4 % — ABNORMAL LOW (ref 39.0–52.0)
Hemoglobin: 9.4 g/dL — ABNORMAL LOW (ref 13.0–17.0)
MCH: 28.3 pg (ref 26.0–34.0)
MCHC: 29.9 g/dL — ABNORMAL LOW (ref 30.0–36.0)
MCV: 94.6 fL (ref 80.0–100.0)
Platelets: 234 K/uL (ref 150–400)
RBC: 3.32 MIL/uL — ABNORMAL LOW (ref 4.22–5.81)
RDW: 16.6 % — ABNORMAL HIGH (ref 11.5–15.5)
WBC: 8.5 K/uL (ref 4.0–10.5)
nRBC: 0 % (ref 0.0–0.2)

## 2024-05-19 LAB — BASIC METABOLIC PANEL WITH GFR
Anion gap: 15 (ref 5–15)
BUN: 30 mg/dL — ABNORMAL HIGH (ref 8–23)
CO2: 22 mmol/L (ref 22–32)
Calcium: 8.8 mg/dL — ABNORMAL LOW (ref 8.9–10.3)
Chloride: 101 mmol/L (ref 98–111)
Creatinine, Ser: 1.81 mg/dL — ABNORMAL HIGH (ref 0.61–1.24)
GFR, Estimated: 40 mL/min — ABNORMAL LOW (ref >60)
Glucose, Bld: 212 mg/dL — ABNORMAL HIGH (ref 70–99)
Potassium: 4.3 mmol/L (ref 3.5–5.1)
Sodium: 137 mmol/L (ref 135–145)

## 2024-05-19 LAB — RESP PANEL BY RT-PCR (RSV, FLU A&B, COVID)  RVPGX2
Influenza A by PCR: NEGATIVE
Influenza B by PCR: NEGATIVE
Resp Syncytial Virus by PCR: NEGATIVE
SARS Coronavirus 2 by RT PCR: NEGATIVE

## 2024-05-19 LAB — PRO BRAIN NATRIURETIC PEPTIDE: Pro Brain Natriuretic Peptide: 27740 pg/mL — ABNORMAL HIGH (ref <300.0)

## 2024-05-19 LAB — URINALYSIS, W/ REFLEX TO CULTURE (INFECTION SUSPECTED)
Bilirubin Urine: NEGATIVE
Glucose, UA: >=500 mg/dL — AB
Hgb urine dipstick: NEGATIVE
Ketones, ur: NEGATIVE mg/dL
Leukocytes,Ua: NEGATIVE
Nitrite: NEGATIVE
Protein, ur: 30 mg/dL — AB
Specific Gravity, Urine: 1.025 (ref 1.005–1.030)
pH: 5 (ref 5.0–8.0)

## 2024-05-19 LAB — TROPONIN T, HIGH SENSITIVITY
Troponin T High Sensitivity: 159 ng/L (ref 0–19)
Troponin T High Sensitivity: 186 ng/L (ref 0–19)

## 2024-05-19 MED ORDER — ALBUTEROL SULFATE (2.5 MG/3ML) 0.083% IN NEBU: 2.5000 mg | INHALATION_SOLUTION | RESPIRATORY_TRACT | Status: DC | PRN

## 2024-05-19 MED ORDER — OXYCODONE-ACETAMINOPHEN 5-325 MG PO TABS: 1.0000 | ORAL_TABLET | ORAL | Status: DC | PRN

## 2024-05-19 MED ORDER — FUROSEMIDE 10 MG/ML IJ SOLN
20.0000 mg | Freq: Once | INTRAMUSCULAR | Status: AC
  Administered 2024-05-19: 22:00:00 20 mg via INTRAVENOUS
  Filled 2024-05-19: qty 4

## 2024-05-19 MED ORDER — MORPHINE SULFATE (PF) 2 MG/ML IV SOLN: 2.0000 mg | INTRAVENOUS | Status: DC | PRN

## 2024-05-19 MED ORDER — SODIUM CHLORIDE 0.9 % IV SOLN
1.0000 g | Freq: Once | INTRAVENOUS | Status: AC
  Administered 2024-05-19: 22:00:00 1 g via INTRAVENOUS
  Filled 2024-05-19: qty 10

## 2024-05-19 MED ORDER — FUROSEMIDE 10 MG/ML IJ SOLN
20.0000 mg | Freq: Once | INTRAMUSCULAR | Status: AC
  Administered 2024-05-19: 20 mg via INTRAVENOUS
  Filled 2024-05-19: qty 4

## 2024-05-19 MED ORDER — HEPARIN (PORCINE) 25000 UT/250ML-% IV SOLN
950.0000 [IU]/h | INTRAVENOUS | Status: DC
  Administered 2024-05-19: 800 [IU]/h via INTRAVENOUS
  Filled 2024-05-19 (×2): qty 250

## 2024-05-19 MED ORDER — FUROSEMIDE 10 MG/ML IJ SOLN
40.0000 mg | Freq: Two times a day (BID) | INTRAMUSCULAR | Status: DC
  Administered 2024-05-20 – 2024-05-21 (×3): 40 mg via INTRAVENOUS
  Filled 2024-05-19 (×4): qty 4

## 2024-05-19 MED ORDER — LORAZEPAM 2 MG/ML IJ SOLN: 2.0000 mg | INTRAMUSCULAR | Status: DC | PRN

## 2024-05-19 MED ORDER — NITROGLYCERIN 0.4 MG SL SUBL: 0.4000 mg | SUBLINGUAL_TABLET | SUBLINGUAL | Status: DC | PRN

## 2024-05-19 MED ORDER — ASPIRIN 81 MG PO CHEW
324.0000 mg | CHEWABLE_TABLET | Freq: Once | ORAL | Status: AC
  Administered 2024-05-19: 19:00:00 324 mg via ORAL
  Filled 2024-05-19: qty 4

## 2024-05-19 MED ORDER — DIVALPROEX SODIUM 250 MG PO DR TAB
250.0000 mg | DELAYED_RELEASE_TABLET | Freq: Every day | ORAL | Status: DC
  Administered 2024-05-19 – 2024-05-23 (×5): 250 mg via ORAL
  Filled 2024-05-19 (×5): qty 1

## 2024-05-19 MED ORDER — INSULIN ASPART 100 UNIT/ML IJ SOLN
0.0000 [IU] | Freq: Three times a day (TID) | INTRAMUSCULAR | Status: DC
  Administered 2024-05-20: 12:00:00 2 [IU] via SUBCUTANEOUS
  Administered 2024-05-21: 17:00:00 1 [IU] via SUBCUTANEOUS
  Administered 2024-05-21 – 2024-05-24 (×5): 2 [IU] via SUBCUTANEOUS
  Administered 2024-05-24: 1 [IU] via SUBCUTANEOUS
  Filled 2024-05-19 (×4): qty 2
  Filled 2024-05-19: qty 1
  Filled 2024-05-19: qty 2
  Filled 2024-05-19: qty 1
  Filled 2024-05-19: qty 2

## 2024-05-19 MED ORDER — INSULIN ASPART 100 UNIT/ML IJ SOLN
0.0000 [IU] | Freq: Every day | INTRAMUSCULAR | Status: DC
  Filled 2024-05-19: qty 5

## 2024-05-19 MED ORDER — DOXYCYCLINE HYCLATE 100 MG PO TABS
100.0000 mg | ORAL_TABLET | Freq: Once | ORAL | Status: AC
  Administered 2024-05-19: 22:00:00 100 mg via ORAL
  Filled 2024-05-19: qty 1

## 2024-05-19 MED ORDER — ACETAMINOPHEN 325 MG PO TABS: 650.0000 mg | ORAL_TABLET | Freq: Four times a day (QID) | ORAL | Status: DC | PRN

## 2024-05-19 MED ORDER — HYDRALAZINE HCL 20 MG/ML IJ SOLN: 5.0000 mg | INTRAMUSCULAR | Status: DC | PRN

## 2024-05-19 MED ORDER — DM-GUAIFENESIN ER 30-600 MG PO TB12
1.0000 | ORAL_TABLET | Freq: Two times a day (BID) | ORAL | Status: DC | PRN
  Administered 2024-05-23: 1 via ORAL
  Filled 2024-05-19: qty 1

## 2024-05-19 MED ORDER — ONDANSETRON HCL 4 MG/2ML IJ SOLN: 4.0000 mg | Freq: Three times a day (TID) | INTRAMUSCULAR | Status: DC | PRN

## 2024-05-19 NOTE — ED Triage Notes (Signed)
 Pt to ED via [facility] from home. Pt reports CP that started today and has been constant. Pt reports hx of stent placement. Pt is on blood thinner. Pt also reports productive cough.   Hx of DM, stroke and seizures  EMS gave 324mg  ASA PTA

## 2024-05-19 NOTE — ED Triage Notes (Signed)
 Arrived by [facility] From home. C/o chest pain today. Reports recently going to cath lab. Productive cough present  History diabetes, stroke, seizures  324 aspirin  given by EMS  EMS vitals: 92% RA- placed on 2L for comfort 96% 88HR 153/86 b/p 295CBG A&O x4

## 2024-05-19 NOTE — ED Notes (Signed)
 Repeat trop sent.  Dr suzanne aware

## 2024-05-19 NOTE — ED Notes (Signed)
 Lab called for Add on BNP

## 2024-05-19 NOTE — ED Notes (Signed)
CBG 129 

## 2024-05-19 NOTE — ED Notes (Signed)
 Urinal provided. out.

## 2024-05-19 NOTE — ED Notes (Signed)
 Called CCMD to initiate cardiac monitoring.

## 2024-05-19 NOTE — ED Provider Notes (Signed)
 [facility] Provider Note    Event Date/Time   First MD Initiated Contact with Patient 05/19/24 1800     (approximate)   History   Chest Pain   HPI  Thomas Moore is a 68 y.o. male past medical history significant for CAD status post CABG, paroxysmal atrial fibrillation on Eliquis , aortic stenosis status post TAVR, interstitial lung disease, seizure disorder, CHF with an EF of 30 to 35%, hypertension, hyperlipidemia, history of CVA, CKD,     Physical Exam   Triage Vital Signs: ED Triage Vitals [05/19/24 1549]  Encounter Vitals Group     BP 130/64     Girls Systolic BP Percentile      Girls Diastolic BP Percentile      Boys Systolic BP Percentile      Boys Diastolic BP Percentile      Pulse Rate 84     Resp 16     Temp 98.5 F (36.9 C)     Temp Source Oral     SpO2 93 %     Weight      Height      Head Circumference      Peak Flow      Pain Score 5     Pain Loc      Pain Education      Exclude from Growth Chart     Most recent vital signs: Vitals:   05/19/24 1549  BP: 130/64  Pulse: 84  Resp: 16  Temp: 98.5 F (36.9 C)  SpO2: 93%    Physical Exam  IMPRESSION / MDM / ASSESSMENT AND PLAN / ED COURSE  I reviewed the triage vital signs and the nursing notes.  *** EKG  I, Clotilda Punter, the attending physician, personally viewed and interpreted this ECG.   Rate: Normal  Rhythm: Normal sinus  Axis: Normal  Intervals: Normal  ST&T Change: None  No tachycardic or bradycardic dysrhythmias while on cardiac telemetry.  RADIOLOGY I independently reviewed imaging, my interpretation of imaging: ***  LABS (all labs ordered are listed, but only abnormal results are displayed) Labs interpreted as -    Labs Reviewed  BASIC METABOLIC PANEL WITH GFR - Abnormal; Notable for the following components:      Result Value   Glucose, Bld 212 (*)    BUN 30 (*)    Creatinine, Ser 1.81 (*)    Calcium  8.8 (*)    GFR, Estimated 40 (*)     All other components within normal limits  CBC - Abnormal; Notable for the following components:   RBC 3.32 (*)    Hemoglobin 9.4 (*)    HCT 31.4 (*)    MCHC 29.9 (*)    RDW 16.6 (*)    All other components within normal limits  TROPONIN T, HIGH SENSITIVITY - Abnormal; Notable for the following components:   Troponin T High Sensitivity 159 (*)    All other components within normal limits  TROPONIN T, HIGH SENSITIVITY     MDM       PROCEDURES:  Critical Care performed: No  Procedures  Patient's presentation is most consistent with {EM COPA:27473}   MEDICATIONS ORDERED IN ED: Medications - No data to display  FINAL CLINICAL IMPRESSION(S) / ED DIAGNOSES   Final diagnoses:  None     Rx / DC Orders   ED Discharge Orders     None        Note:  This document was prepared using  Dragon chemical engineer and may include unintentional dictation errors.

## 2024-05-19 NOTE — ED Notes (Signed)
 Pt called out due to feeling hot and wanting to take his sweater and pants off, this tech helped pt and placed clothes on bench in pt's room. Pt also asked for something the eat, this tech informed pt that once I get in contact with primary RN I would get food if he can have it.

## 2024-05-19 NOTE — H&P (Incomplete)
 History and Physical    Thomas Moore FMW:[id number] DOB: [birth year] DOA: 05/19/2024  Referring MD/NP/PA:   PCP: Lenon Layman ORN, MD   Patient coming from:  The patient is coming from home.     Chief Complaint: SOB, chest pain  HPI: Thomas Moore is a 68 y.o. male with medical history significant of sCHF with EF 30-35%, stroke,  A fib and PE on Eliquis , CAD, s/p of CABG 1997, HTN, HLD, DM, stroke, CKD-3a, SVT, thrombocytopenia, aortic stenosis (s/p of TAVR), bilateral carotid artery stenosis, seizure, chronic back pain, kidney stone (s/p of ESWL), loop recorder, thrombocytopenia, recent non-STEMI, who presents with SOB and chest pain.  Patient was recently hospitalized from 11/30 - 12/3 due to non-STEMI, with troponin up to 1503.  Patient underwent LHC 12/1 with diffuse disease of SVG to OM.  Cardiologist is consulted, recommended medical management. Pt states that he has SOB in the past several days, which has been progressively worsening.  He has cough with little mucus production, no fever or chills.  He also reports chest pain, which is located in the front chest, constant, moderately, radiates into the left arm.  No nausea, vomiting, diarrhea or abdominal pain.  No symptoms of UTI.  Data reviewed independently and ED Course: pt was found to have troponin 159 --> 186, negative PCR for COVID, flu and RSV, BNP 27740, WBC 8.5, renal function close to baseline.  Temperature normal, blood pressure 131/68, heart rate 70-80s, RR 24, oxygen sat 91-93% on room air.  Patient is admitted to telemetry bed as inpatient.  Dr. Florencio of cardiology is consulted.  Chest x-ray: 1. Small but increasing bilateral pleural effusions with basilar atelectasis or consolidation also increasing. 2. Cardiac enlargement with pulmonary vascular congestion, similar to the prior study. 3. Mild perihilar edema is suggested   EKG: I have personally reviewed.  Sinus rhythm, QTc 468, first-degree AV block, T wave  inversion in V5-V6 and inferior leads.   Review of Systems:   General: no fevers, chills, no body weight gain, has fatigue HEENT: no blurry vision, hearing changes or sore throat Respiratory: has dyspnea, coughing, no wheezing CV: has chest pain, no palpitations GI: no nausea, vomiting, abdominal pain, diarrhea, constipation GU: no dysuria, burning on urination, increased urinary frequency, hematuria  Ext: has trace leg edema Neuro: no unilateral weakness, numbness, or tingling, no vision change or hearing loss Skin: no rash, no skin tear. MSK: No muscle spasm, no deformity, no limitation of range of movement in spin Heme: No easy bruising.  Travel history: No recent long distant travel.   Allergy: Allergies[1]  Past Medical History:  Diagnosis Date   CAD (coronary artery disease) of artery bypass graft    s/p CABG x 4 in 1997   CHF (congestive heart failure) (HCC)    COPD (chronic obstructive pulmonary disease) (HCC)    Coronary artery disease    CVA (cerebral vascular accident) (HCC)    Diabetes mellitus without complication (HCC)    Dysrhythmia    GERD (gastroesophageal reflux disease)    HLD (hyperlipidemia)    Hypertension    S/P TAVR (transcatheter aortic valve replacement) 07/05/2021   with Edwards 26mm S3UR via TF approach with Dr. Wonda and Dr. Lucas   Seizures [facility])     Past Surgical History:  Procedure Laterality Date   BYPASS GRAFT ANGIOGRAPHY N/A 04/25/2021   Procedure: BYPASS GRAFT ANGIOGRAPHY;  Surgeon: Lawyer Bernardino Cough, MD;  Location: [facility] INVASIVE CV LAB;  Service: Cardiovascular;  Laterality:  N/A;   CARDIAC SURGERY     CORNEAL TRANSPLANT Right    CORONARY ARTERY BYPASS GRAFT  1997   ELECTROPHYSIOLOGY STUDY N/A 09/08/2021   Procedure: ELECTROPHYSIOLOGY STUDY;  Surgeon: Cindie Ole DASEN, MD;  Location: [facility] INVASIVE CV LAB;  Service: Cardiovascular;  Laterality: N/A;   EYE SURGERY     FLEXOR TENDON REPAIR Left 07/11/2019   Procedure: FLEXOR  tenolysis  REPAIR LEFT RING FINGER with tednon repair;  Surgeon: Kathlynn Sharper, MD;  Location: [facility] ORS;  Service: Orthopedics;  Laterality: Left;   INCISION AND DRAINAGE ABSCESS Left 05/08/2019   Procedure: INCISION AND DRAINAGE ABSCESS;  Surgeon: Mardee Lynwood SQUIBB, MD;  Location: [facility] ORS;  Service: Orthopedics;  Laterality: Left;   INTRAOPERATIVE TRANSTHORACIC ECHOCARDIOGRAM N/A 07/05/2021   Procedure: INTRAOPERATIVE TRANSTHORACIC ECHOCARDIOGRAM;  Surgeon: Wonda Sharper, MD;  Location: [facility] OR;  Service: Open Heart Surgery;  Laterality: N/A;   LEFT HEART CATH AND CORONARY ANGIOGRAPHY Left 06/09/2022   Procedure: LEFT HEART CATH AND CORONARY ANGIOGRAPHY;  Surgeon: Fernand Denyse LABOR, MD;  Location: [facility] INVASIVE CV LAB;  Service: Cardiovascular;  Laterality: Left;   LEFT HEART CATH AND CORONARY ANGIOGRAPHY Left 09/27/2023   Procedure: LEFT HEART CATH AND CORONARY ANGIOGRAPHY with possible coronary intervention;  Surgeon: Fernand Denyse LABOR, MD;  Location: [facility] INVASIVE CV LAB;  Service: Cardiovascular;  Laterality: Left;   LEFT HEART CATH AND CORS/GRAFTS ANGIOGRAPHY N/A 10/02/2019   Procedure: LEFT HEART CATH AND CORS/GRAFTS ANGIOGRAPHY;  Surgeon: Anner Alm ORN, MD;  Location: [facility] INVASIVE CV LAB;  Service: Cardiovascular;  Laterality: N/A;   LEFT HEART CATH AND CORS/GRAFTS ANGIOGRAPHY N/A 07/17/2022   Procedure: LEFT HEART CATH AND CORS/GRAFTS ANGIOGRAPHY and possible PCI and stent;  Surgeon: Fernand Denyse LABOR, MD;  Location: [facility] INVASIVE CV LAB;  Service: Cardiovascular;  Laterality: N/A;   LEFT HEART CATH AND CORS/GRAFTS ANGIOGRAPHY N/A 05/05/2024   Procedure: LEFT HEART CATH AND CORS/GRAFTS ANGIOGRAPHY;  Surgeon: Florencio Cara BIRCH, MD;  Location: [facility] INVASIVE CV LAB;  Service: Cardiovascular;  Laterality: N/A;   LOOP RECORDER INSERTION N/A 10/06/2019   Procedure: LOOP RECORDER INSERTION;  Surgeon: Waddell Danelle ORN, MD;  Location: [facility] INVASIVE CV LAB;  Service: Cardiovascular;  Laterality: N/A;    TRANSCATHETER AORTIC VALVE REPLACEMENT, TRANSFEMORAL N/A 07/05/2021   Procedure: TRANSCATHETER AORTIC VALVE REPLACEMENT, TRANSFEMORAL;  Surgeon: Wonda Sharper, MD;  Location: [facility] OR;  Service: Open Heart Surgery;  Laterality: N/A;   TRIGGER FINGER RELEASE Left     Social History:  reports that he quit smoking about 41 years ago. His smoking use included cigarettes. He has never used smokeless tobacco. He reports that he does not currently use alcohol. He reports that he does not use drugs.  Family History:  Family History  Problem Relation Age of Onset   Seizures Brother      Prior to Admission medications  Medication Sig Start Date End Date Taking? Authorizing Provider  acetaminophen  (TYLENOL ) 650 MG CR tablet Take 1,300 mg by mouth every 8 (eight) hours as needed for pain.    [provider]  amiodarone  (PACERONE ) 100 MG tablet Take 2 tablets (200 mg total) by mouth daily. 05/07/24   Al-Sultani, Anmar, MD  apixaban  (ELIQUIS ) 5 MG TABS tablet Take 1 tablet (5 mg total) by mouth 2 (two) times daily. 11/14/22   Riddle, Suzann, NP  atorvastatin  (LIPITOR ) 80 MG tablet Take 1 tablet (80 mg total) by mouth at bedtime. 08/06/23   Barbarann Nest, MD  Calcium  Citrate-Vitamin D  (CALCIUM  + D PO)  Take 1 tablet by mouth 2 (two) times daily.    [provider]  clopidogrel  (PLAVIX ) 75 MG tablet Take 1 tablet by mouth once daily 02/15/24   Fernand Denyse LABOR, MD  cyanocobalamin  (VITAMIN B12) 1000 MCG tablet Take 1 tablet (1,000 mcg total) by mouth daily. 03/20/24   Amin, Sumayya, MD  cyclopentolate (CYCLODRYL,CYCLOGYL) 1 % ophthalmic solution Place 1 drop into the left eye 2 (two) times daily.    [provider]  diclofenac Sodium (VOLTAREN) 1 % GEL Apply 4 g topically 4 (four) times daily. 02/21/24 02/20/25  [provider]  divalproex  (DEPAKOTE ) 250 MG DR tablet Take 250 mg by mouth at bedtime. 04/29/24   [provider]  erythromycin ophthalmic ointment Place 1  Application into both eyes 3 (three) times daily. 03/05/24 06/03/24  [provider]  insulin  degludec (TRESIBA ) 100 UNIT/ML FlexTouch Pen Inject 10 Units into the skin daily. 07/11/23   [provider]  isosorbide  mononitrate (IMDUR ) 30 MG 24 hr tablet Take 0.5 tablets (15 mg total) by mouth daily. 05/08/24   Al-Sultani, Anmar, MD  JARDIANCE  25 MG TABS tablet Take 25 mg by mouth daily.    [provider]  levothyroxine  (SYNTHROID ) 50 MCG tablet Take 50 mcg by mouth daily before breakfast. 04/28/24 04/28/25  [provider]  metoprolol  succinate (TOPROL  XL) 25 MG 24 hr tablet Take 0.5 tablets (12.5 mg total) by mouth daily. 03/24/24 03/24/25  Fernand Denyse LABOR, MD  moxifloxacin (VIGAMOX) 0.5 % ophthalmic solution Place 1 drop into the left eye 3 (three) times daily. 04/29/24   [provider]  Multiple Vitamin (MULTIVITAMIN) capsule Take 1 capsule by mouth daily.    [provider]  mupirocin ointment (BACTROBAN) 2 % Apply 1 Application topically 2 (two) times daily. 02/13/24   [provider]  nitroGLYCERIN  (NITROSTAT ) 0.4 MG SL tablet Place 1 tablet (0.4 mg total) under the tongue every 5 (five) minutes as needed for chest pain. 08/03/23   Scoggins, Amber, NP  ofloxacin  (OCUFLOX ) 0.3 % ophthalmic solution Place 1 drop into the left eye 4 (four) times daily. 01/23/24   [provider]  OXERVATE  0.002 % SOLN Place 1 drop into both eyes 6 (six) times daily. 04/22/24   [provider]  prednisoLONE  acetate (PRED FORTE ) 1 % ophthalmic suspension Place 1 drop into the left eye 4 (four) times daily. 01/16/24   [provider]  [Paused] sacubitril -valsartan  (ENTRESTO ) 24-26 MG Take 1 tablet by mouth 2 (two) times daily. Wait to take this until your doctor or other care provider tells you to start again. 01/22/24   Fernand Denyse LABOR, MD  torsemide  (DEMADEX ) 20 MG tablet Take 1 tablet (20 mg total) by mouth daily. 03/20/24   Caleen Qualia, MD  TRULICITY  4.5 MG/0.5ML SOAJ Inject 4.5 mg into the skin once a week. 02/12/24   [provider]    Physical Exam: Vitals:   05/19/24 1953 05/19/24 2200 05/19/24 2300 05/19/24 2340  BP:  137/68 (!) 152/70   Pulse:  80 85   Resp:  (!) 24 (!) 29   Temp: 98.4 F (36.9 C)   98.3 F (36.8 C)  TempSrc: Oral   Oral  SpO2:  93% 93%    General: Not in acute distress HEENT:       Eyes: PERRL, EOMI, no jaundice       ENT: No discharge from the ears and nose, no pharynx injection, no tonsillar enlargement.  Neck: has positive JVD, no bruit, no mass felt. Heme: No neck lymph node enlargement. Cardiac: S1/S2, RRR, No gallops or rubs. Respiratory: has fine crackles bilaterally GI: Soft, nondistended, nontender, no rebound pain, no organomegaly, BS present. GU: No hematuria Ext: Has trace leg edema bilaterally. 1+DP/PT pulse bilaterally. Musculoskeletal: No joint deformities, No joint redness or warmth, no limitation of ROM in spin. Skin: No rashes.  Neuro: Alert, oriented X3, cranial nerves II-XII grossly intact, moves all extremities normally. Psych: Patient is not psychotic, no suicidal or hemocidal ideation.  Labs on Admission: I have personally reviewed following labs and imaging studies  CBC: Recent Labs  Lab 05/19/24 1601  WBC 8.5  HGB 9.4*  HCT 31.4*  MCV 94.6  PLT 234   Basic Metabolic Panel: Recent Labs  Lab 05/19/24 1601  NA 137  K 4.3  CL 101  CO2 22  GLUCOSE 212*  BUN 30*  CREATININE 1.81*  CALCIUM  8.8*   GFR: Estimated Creatinine Clearance: 31.4 mL/min (A) (by C-G formula based on SCr of 1.81 mg/dL (H)). Liver Function Tests: No results for input(s): AST, ALT, ALKPHOS, BILITOT, PROT, ALBUMIN in the last 168 hours. No results for input(s): LIPASE, AMYLASE in the last 168 hours. No results for input(s): AMMONIA in the last 168 hours. Coagulation Profile: No results for input(s): INR, PROTIME in the last 168  hours. Cardiac Enzymes: No results for input(s): CKTOTAL, CKMB, CKMBINDEX, TROPONINI in the last 168 hours. BNP (last 3 results) Recent Labs    05/19/24 1601  PROBNP 27,740.0*   HbA1C: No results for input(s): HGBA1C in the last 72 hours. CBG: No results for input(s): GLUCAP in the last 168 hours. Lipid Profile: No results for input(s): CHOL, HDL, LDLCALC, TRIG, CHOLHDL, LDLDIRECT in the last 72 hours. Thyroid  Function Tests: No results for input(s): TSH, T4TOTAL, FREET4, T3FREE, THYROIDAB in the last 72 hours. Anemia Panel: No results for input(s): VITAMINB12, FOLATE, FERRITIN, TIBC, IRON, RETICCTPCT in the last 72 hours. Urine analysis:    Component Value Date/Time   COLORURINE YELLOW (A) 05/19/2024 1934   APPEARANCEUR HAZY (A) 05/19/2024 1934   APPEARANCEUR Clear 05/12/2020 0940   LABSPEC 1.025 05/19/2024 1934   PHURINE 5.0 05/19/2024 1934   GLUCOSEU >=500 (A) 05/19/2024 1934   HGBUR NEGATIVE 05/19/2024 1934   BILIRUBINUR NEGATIVE 05/19/2024 1934   BILIRUBINUR Negative 05/12/2020 0940   KETONESUR NEGATIVE 05/19/2024 1934   PROTEINUR 30 (A) 05/19/2024 1934   NITRITE NEGATIVE 05/19/2024 1934   LEUKOCYTESUR NEGATIVE 05/19/2024 1934   Sepsis Labs: @LABRCNTIP (procalcitonin:4,lacticidven:4) ) Recent Results (from the past 240 hours)  Resp panel by RT-PCR (RSV, Flu A&B, Covid) Anterior Nasal Swab     Status: None   Collection Time: 05/19/24  6:44 PM   Specimen: Anterior Nasal Swab  Result Value Ref Range Status   SARS Coronavirus 2 by RT PCR NEGATIVE NEGATIVE Final    Comment: (NOTE) SARS-CoV-2 target nucleic acids are NOT DETECTED.  The SARS-CoV-2 RNA is generally detectable in upper respiratory specimens during the acute phase of infection. The lowest concentration of SARS-CoV-2 viral copies this assay can detect is 138 copies/mL. A negative result does not preclude SARS-Cov-2 infection and should not be used as the sole  basis for treatment or other patient management decisions. A negative result may occur with  improper specimen collection/handling, submission of specimen other than nasopharyngeal swab, presence of viral mutation(s) within the areas targeted by this assay, and inadequate number of viral copies(<138 copies/mL). A negative result must be combined  with clinical observations, patient history, and epidemiological information. The expected result is Negative.  Fact Sheet for Patients:  bloggercourse.com  Fact Sheet for Healthcare Providers:  seriousbroker.it  This test is no t yet approved or cleared by the [place]  FDA and  has been authorized for detection and/or diagnosis of SARS-CoV-2 by FDA under an Emergency Use Authorization (EUA). This EUA will remain  in effect (meaning this test can be used) for the duration of the COVID-19 declaration under Section 564(b)(1) of the Act, 21 U.S.C.section 360bbb-3(b)(1), unless the authorization is terminated  or revoked sooner.       Influenza A by PCR NEGATIVE NEGATIVE Final   Influenza B by PCR NEGATIVE NEGATIVE Final    Comment: (NOTE) The Xpert Xpress SARS-CoV-2/FLU/RSV plus assay is intended as an aid in the diagnosis of influenza from Nasopharyngeal swab specimens and should not be used as a sole basis for treatment. Nasal washings and aspirates are unacceptable for Xpert Xpress SARS-CoV-2/FLU/RSV testing.  Fact Sheet for Patients: bloggercourse.com  Fact Sheet for Healthcare Providers: seriousbroker.it  This test is not yet approved or cleared by the [place]  FDA and has been authorized for detection and/or diagnosis of SARS-CoV-2 by FDA under an Emergency Use Authorization (EUA). This EUA will remain in effect (meaning this test can be used) for the duration of the COVID-19 declaration under Section 564(b)(1) of the Act,  21 U.S.C. section 360bbb-3(b)(1), unless the authorization is terminated or revoked.     Resp Syncytial Virus by PCR NEGATIVE NEGATIVE Final    Comment: (NOTE) Fact Sheet for Patients: bloggercourse.com  Fact Sheet for Healthcare Providers: seriousbroker.it  This test is not yet approved or cleared by the [place]  FDA and has been authorized for detection and/or diagnosis of SARS-CoV-2 by FDA under an Emergency Use Authorization (EUA). This EUA will remain in effect (meaning this test can be used) for the duration of the COVID-19 declaration under Section 564(b)(1) of the Act, 21 U.S.C. section 360bbb-3(b)(1), unless the authorization is terminated or revoked.  Performed at [facility], [address], [place], [place] [address]      Radiological Exams on Admission:   Assessment/Plan Principal Problem:   Acute on chronic combined systolic and diastolic congestive heart failure (HCC) Active Problems:   NSTEMI (non-ST elevated myocardial infarction) (HCC)   CAD (coronary artery disease)   Essential hypertension   HLD (hyperlipidemia)   Type II diabetes mellitus with renal manifestations (HCC)   Chronic kidney disease, stage 3b (HCC)   Normocytic anemia   COPD (chronic obstructive pulmonary disease) (HCC)   Hypothyroidism   History of stroke   History of seizure   Paroxysmal atrial fibrillation (HCC)   Hx of pulmonary embolus   Assessment and Plan:  57M, hx of EF 30-35%, stroke, A fib and PE on Eliquis , CAD, s/p of CABG 1997, HTN, HLD, DM, stroke, CKD-3a, SVT, thrombocytopenia, aortic stenosis (s/p of TAVR), seizure, loop recorder, thrombocytopenia, recently hospitalized from 11/30 - 12/3 due to non-STEMI, with troponin up to 1503.  Patient underwent LHC 12/1 with diffuse disease of SVG to OM.  Cardiologist recommended medical management. Today admitted due to chest pain and CHF exacerbation, BNP [id number].   Started IV Lasix .  Troponin 159 -> 186.  Switched Eliquis  to IV heparin .  Acute on chronic combined systolic and diastolic congestive heart failure [facility]): Patient has SOB, trace leg edema, significantly elevated BNP 27740, clinically consistent with CHF exacerbation.  2D echo on 05/05/2024 showed EF 35-40%.  X-ray showed some consolidation however, patient does not have fever or leukocytosis.  Clinically does not seem to have pneumonia.  Patient received 1 dose of Rocephin  and the doxycyclin in the ED, will not continue antibiotics.  -Will admit to tele bed   as inpatient -Lasix  40 mg bid by IV -trend trop -Daily weights -strict I/O's -Low salt diet -Fluid restriction -As needed bronchodilators for shortness of breath  NSTEMI (non-ST elevated myocardial infarction) (HCC) and CAD (coronary artery disease): Troponin 158  --> 186.  S/p of CABG 1997. Patient recently underwent LHC 12/1 with diffuse disease of SVG to OM.  Cardiologist recommended medical management. - Switch Eliquis  to IV heparin  - will  continue, plavix , Lipitor , metoprolol , Imdur  - Trend Trop - prn Morphine , Percocet, Tylenol  for pain - prn Nitroglycerin   Essential hypertension -IV hydralazine  as needed - Imdur , metoprolol   HLD (hyperlipidemia) -Lipitor   Type II diabetes mellitus with renal manifestations (HCC): Recent A1c 6.2, well-controlled.  Patient is taking Trulicity , Jardiance , Tresiba  10 units daily -SSI - Glargine insulin  80 units daily  Chronic kidney disease, stage 3b (HCC): Renal function close to baseline.  Recent baseline creatinine 1.98 on 05/07/2024.  His creatinine is 1.81, BUN 30, GFR 40. -Follow-up with BMP  Normocytic anemia: Hemoglobin stable 9.4 (9.7 on 05/07/2024). -Follow-up with CBC  COPD (chronic obstructive pulmonary disease) (HCC): No wheezing -Bronchodilators.  Mucinex   Hypothyroidism -Synthroid   History of stroke -Plavix , Lipitor  -Patient is also on Eliquis  which is switched to IV  heparin  in hospital  History of seizure -Seizure precaution - P.o. Ativan  for seizure - Continue home Depakote  250 mg daily  Paroxysmal atrial fibrillation (HCC): Currently heart rate 70-80s -Metoprolol  - Switched Eliquis  to IV heparin  as above  Hx of pulmonary embolus - IV heparin       DVT ppx: on IV Heparin      Code Status: Full code   Family Communication:     not done, no family member is at bed side.      Disposition Plan:  Anticipate discharge back to previous environment  Consults called:  Dr. Florencio of cardiology is consulted.  Admission status and Level of care: Telemetry:    for obs as inpt        Dispo: The patient is from: Home              Anticipated d/c is to: Home              Anticipated d/c date is: 2 days              Patient currently is not medically stable to d/c.    Severity of Illness:  The appropriate patient status for this patient is INPATIENT. Inpatient status is judged to be reasonable and necessary in order to provide the required intensity of service to ensure the patient's safety. The patient's presenting symptoms, physical exam findings, and initial radiographic and laboratory data in the context of their chronic comorbidities is felt to place them at high risk for further clinical deterioration. Furthermore, it is not anticipated that the patient will be medically stable for discharge from the hospital within 2 midnights of admission.   * I certify that at the point of admission it is my clinical judgment that the patient will require inpatient hospital care spanning beyond 2 midnights from the point of admission due to high intensity of service, high risk for further deterioration and high frequency of surveillance required.*       Date of Service  05/20/2024    Keegan Ducey [facility]   If 7PM-7AM, please contact night-coverage [email or web address] 05/20/2024, 12:12 AM     [1] No Known Allergies

## 2024-05-20 ENCOUNTER — Ambulatory Visit: Payer: Self-pay | Admitting: Pharmacy Technician

## 2024-05-20 LAB — PROCALCITONIN: Procalcitonin: 0.11 ng/mL

## 2024-05-20 LAB — TROPONIN T, HIGH SENSITIVITY
Troponin T High Sensitivity: 178 ng/L (ref 0–19)
Troponin T High Sensitivity: 189 ng/L (ref 0–19)
Troponin T High Sensitivity: 195 ng/L (ref 0–19)

## 2024-05-20 LAB — CBG MONITORING, ED
Glucose-Capillary: 92 mg/dL (ref 70–99)
Glucose-Capillary: 168 mg/dL — ABNORMAL HIGH (ref 70–99)
Glucose-Capillary: 108 mg/dL — ABNORMAL HIGH (ref 70–99)
Glucose-Capillary: 154 mg/dL — ABNORMAL HIGH (ref 70–99)

## 2024-05-20 LAB — BASIC METABOLIC PANEL WITH GFR
Anion gap: 14 (ref 5–15)
BUN: 30 mg/dL — ABNORMAL HIGH (ref 8–23)
CO2: 24 mmol/L (ref 22–32)
Calcium: 8.5 mg/dL — ABNORMAL LOW (ref 8.9–10.3)
Chloride: 100 mmol/L (ref 98–111)
Creatinine, Ser: 1.84 mg/dL — ABNORMAL HIGH (ref 0.61–1.24)
GFR, Estimated: 39 mL/min — ABNORMAL LOW (ref >60)
Glucose, Bld: 113 mg/dL — ABNORMAL HIGH (ref 70–99)
Potassium: 4.1 mmol/L (ref 3.5–5.1)
Sodium: 138 mmol/L (ref 135–145)

## 2024-05-20 LAB — CBC
HCT: 28.2 % — ABNORMAL LOW (ref 39.0–52.0)
Hemoglobin: 8.8 g/dL — ABNORMAL LOW (ref 13.0–17.0)
MCH: 28.6 pg (ref 26.0–34.0)
MCHC: 31.2 g/dL (ref 30.0–36.0)
MCV: 91.6 fL (ref 80.0–100.0)
Platelets: 213 K/uL (ref 150–400)
RBC: 3.08 MIL/uL — ABNORMAL LOW (ref 4.22–5.81)
RDW: 16.5 % — ABNORMAL HIGH (ref 11.5–15.5)
WBC: 8.9 K/uL (ref 4.0–10.5)
nRBC: 0 % (ref 0.0–0.2)

## 2024-05-20 LAB — HEPARIN LEVEL (UNFRACTIONATED): Heparin Unfractionated: 0.96 [IU]/mL — ABNORMAL HIGH (ref 0.30–0.70)

## 2024-05-20 LAB — APTT
aPTT: 83 s — ABNORMAL HIGH (ref 24–36)
aPTT: 57 s — ABNORMAL HIGH (ref 24–36)
aPTT: 76 s — ABNORMAL HIGH (ref 24–36)

## 2024-05-20 LAB — MAGNESIUM: Magnesium: 2.1 mg/dL (ref 1.7–2.4)

## 2024-05-20 MED ORDER — PREDNISOLONE ACETATE 1 % OP SUSP
1.0000 [drp] | Freq: Four times a day (QID) | OPHTHALMIC | Status: DC
  Administered 2024-05-20 – 2024-05-21 (×7): 1 [drp] via OPHTHALMIC
  Filled 2024-05-20 (×3): qty 5

## 2024-05-20 MED ORDER — ADULT MULTIVITAMIN W/MINERALS CH
1.0000 | ORAL_TABLET | Freq: Every day | ORAL | Status: DC
  Administered 2024-05-20 – 2024-05-24 (×5): 1 via ORAL
  Filled 2024-05-20 (×5): qty 1

## 2024-05-20 MED ORDER — AMIODARONE HCL 200 MG PO TABS
100.0000 mg | ORAL_TABLET | Freq: Every day | ORAL | Status: DC
  Administered 2024-05-20 – 2024-05-24 (×5): 100 mg via ORAL
  Filled 2024-05-20 (×5): qty 1

## 2024-05-20 MED ORDER — LEVOTHYROXINE SODIUM 50 MCG PO TABS
50.0000 ug | ORAL_TABLET | Freq: Every day | ORAL | Status: DC
  Administered 2024-05-20 – 2024-05-24 (×5): 50 ug via ORAL
  Filled 2024-05-20 (×6): qty 1

## 2024-05-20 MED ORDER — ATORVASTATIN CALCIUM 20 MG PO TABS
80.0000 mg | ORAL_TABLET | Freq: Every day | ORAL | Status: DC
  Administered 2024-05-20 – 2024-05-23 (×5): 80 mg via ORAL
  Filled 2024-05-20 (×5): qty 4

## 2024-05-20 MED ORDER — HEPARIN BOLUS VIA INFUSION
1000.0000 [IU] | Freq: Once | INTRAVENOUS | Status: AC
  Administered 2024-05-20: 14:00:00 1000 [IU] via INTRAVENOUS
  Filled 2024-05-20: qty 1000

## 2024-05-20 MED ORDER — CLOPIDOGREL BISULFATE 75 MG PO TABS
75.0000 mg | ORAL_TABLET | Freq: Every day | ORAL | Status: DC
  Administered 2024-05-20 – 2024-05-24 (×5): 75 mg via ORAL
  Filled 2024-05-20 (×7): qty 1

## 2024-05-20 MED ORDER — ISOSORBIDE MONONITRATE ER 30 MG PO TB24
15.0000 mg | ORAL_TABLET | Freq: Every day | ORAL | Status: DC
  Administered 2024-05-20: 10:00:00 15 mg via ORAL
  Filled 2024-05-20 (×2): qty 1

## 2024-05-20 MED ORDER — OFLOXACIN 0.3 % OP SOLN
1.0000 [drp] | Freq: Four times a day (QID) | OPHTHALMIC | Status: DC
  Administered 2024-05-20 – 2024-05-21 (×6): 1 [drp] via OPHTHALMIC
  Filled 2024-05-20: qty 5

## 2024-05-20 MED ORDER — METOPROLOL SUCCINATE ER 25 MG PO TB24
12.5000 mg | ORAL_TABLET | Freq: Every day | ORAL | Status: DC
  Administered 2024-05-20 – 2024-05-24 (×4): 12.5 mg via ORAL
  Filled 2024-05-20 (×5): qty 1

## 2024-05-20 MED ORDER — INSULIN GLARGINE 100 UNIT/ML ~~LOC~~ SOLN
8.0000 [IU] | Freq: Every day | SUBCUTANEOUS | Status: DC
  Administered 2024-05-20 – 2024-05-24 (×5): 8 [IU] via SUBCUTANEOUS
  Filled 2024-05-20 (×6): qty 0.08

## 2024-05-20 MED ORDER — VITAMIN B-12 1000 MCG PO TABS
1000.0000 ug | ORAL_TABLET | Freq: Every day | ORAL | Status: DC
  Administered 2024-05-20 – 2024-05-24 (×5): 1000 ug via ORAL
  Filled 2024-05-20 (×2): qty 1
  Filled 2024-05-20 (×2): qty 2
  Filled 2024-05-20: qty 1

## 2024-05-20 MED ORDER — CENEGERMIN-BKBJ 0.002 % OP SOLN: 1.0000 [drp] | Freq: Every day | OPHTHALMIC | Status: DC

## 2024-05-20 MED ORDER — EMPAGLIFLOZIN 25 MG PO TABS
25.0000 mg | ORAL_TABLET | Freq: Every day | ORAL | Status: DC
  Administered 2024-05-20 – 2024-05-24 (×5): 25 mg via ORAL
  Filled 2024-05-20 (×6): qty 1

## 2024-05-20 NOTE — Progress Notes (Signed)
 PHARMACY - ANTICOAGULATION CONSULT NOTE  Pharmacy Consult for Heparin   Indication: chest pain/ACS and atrial fibrillation  Allergies[1]  Patient Measurements: Height: 5' 2.99 (160 cm) Weight: 65.3 kg (144 lb) IBW/kg (Calculated) : 56.88 HEPARIN  DW (KG): 65.3  Vital Signs: Temp: 98.7 F (37.1 C) (12/16 0651) Temp Source: Oral (12/16 0651) BP: 137/65 (12/16 0800) Pulse Rate: 76 (12/16 0800)  Labs: Recent Labs    05/19/24 1601 05/20/24 0230 05/20/24 0527 05/20/24 1230  HGB 9.4* 8.8*  --   --   HCT 31.4* 28.2*  --   --   PLT 234 213  --   --   APTT  --   --  83* 57*  HEPARINUNFRC  --   --  0.96*  --   CREATININE 1.81* 1.84*  --   --     Estimated Creatinine Clearance: 30.9 mL/min (A) (by C-G formula based on SCr of 1.84 mg/dL (H)).   Medical History: Past Medical History:  Diagnosis Date   CAD (coronary artery disease) of artery bypass graft    s/p CABG x 4 in 1997   CHF (congestive heart failure) (HCC)    COPD (chronic obstructive pulmonary disease) (HCC)    Coronary artery disease    CVA (cerebral vascular accident) (HCC)    Diabetes mellitus without complication (HCC)    Dysrhythmia    GERD (gastroesophageal reflux disease)    HLD (hyperlipidemia)    Hypertension    S/P TAVR (transcatheter aortic valve replacement) 07/05/2021   with Edwards 26mm S3UR via TF approach with Dr. Wonda and Dr. Lucas   Seizures [facility])     Medications:  (Not in a hospital admission)   Assessment: Pharmacy consulted to dose heparin  in this 68 year old male admitted with ACS/NSTEMI.  Pt was on Eliquis  5 mg PO BID PTA, last dose unknown.    CrCl = 31.4 ml/min   Goal of Therapy:  Heparin  level 0.3-0.7 units/ml aPTT 66 - 102 seconds Monitor platelets by anticoagulation protocol: Yes  12/16 0527: aPTT 83, therapeutic x 1, HL 0.96 12/16 1230: aPTT 57, SUBtherapeutic   Plan:  - Will order Heparin  1000 units IV bolus x 1 - Will increase Heparin  gtt rate to 950 units/hr  -  Will recheck aPTT in 6 hrs from rate change - Will continue to use aPTT to guide dosing until correlating with HL - Will continue to monitor CBC and HL daily     Ransom Blanch PGY-1 Pharmacy Resident  [place] [facility]  05/20/2024 1:12 PM       [1] No Known Allergies

## 2024-05-20 NOTE — Progress Notes (Signed)
 PHARMACY - ANTICOAGULATION CONSULT NOTE  Pharmacy Consult for Heparin   Indication: chest pain/ACS and atrial fibrillation  Allergies[1]  Patient Measurements: Height: 5' 2.99 (160 cm) Weight: 65.3 kg (144 lb) IBW/kg (Calculated) : 56.88 HEPARIN  DW (KG): 65.3  Vital Signs: Temp: 98.2 F (36.8 C) (12/16 1930) Temp Source: Oral (12/16 1930) BP: 115/65 (12/16 1930) Pulse Rate: 75 (12/16 1930)  Labs: Recent Labs    05/19/24 1601 05/20/24 0230 05/20/24 0527 05/20/24 1230 05/20/24 2052  HGB 9.4* 8.8*  --   --   --   HCT 31.4* 28.2*  --   --   --   PLT 234 213  --   --   --   APTT  --   --  83* 57* 76*  HEPARINUNFRC  --   --  0.96*  --   --   CREATININE 1.81* 1.84*  --   --   --     Estimated Creatinine Clearance: 30.9 mL/min (A) (by C-G formula based on SCr of 1.84 mg/dL (H)).   Medical History: Past Medical History:  Diagnosis Date   CAD (coronary artery disease) of artery bypass graft    s/p CABG x 4 in 1997   CHF (congestive heart failure) (HCC)    COPD (chronic obstructive pulmonary disease) (HCC)    Coronary artery disease    CVA (cerebral vascular accident) (HCC)    Diabetes mellitus without complication (HCC)    Dysrhythmia    GERD (gastroesophageal reflux disease)    HLD (hyperlipidemia)    Hypertension    S/P TAVR (transcatheter aortic valve replacement) 07/05/2021   with Edwards 26mm S3UR via TF approach with Dr. Wonda and Dr. Lucas   Seizures [facility])     Medications:  (Not in a hospital admission)   Assessment: Pharmacy consulted to dose heparin  in this 68 year old male admitted with ACS/NSTEMI.  Pt was on Eliquis  5 mg PO BID PTA, last dose unknown.    CrCl = 31.4 ml/min   Goal of Therapy:  Heparin  level 0.3-0.7 units/ml aPTT 66 - 102 seconds Monitor platelets by anticoagulation protocol: Yes  12/16 0527: aPTT 83, therapeutic x 1, HL 0.96 12/16 1230: aPTT 57, SUBtherapeutic 12/16 2052: aPTT 76, therapeutic x 1   Plan:  aPTT therapeutic  x 1 Continue heparin  infusion at 950 units/hr Recheck aPTT to confirm in 6 hours Adjust based on aPTT until correlation with HL Daily CBC while on heparin    Mose Blew, PharmD [facility]  05/20/2024 9:41 PM        [1] No Known Allergies

## 2024-05-20 NOTE — Progress Notes (Signed)
 Progress Note   Patient: Thomas Moore FMW:[id number] DOB: 1956-03-14 DOA: 05/19/2024     1 DOS: the patient was seen and examined on 05/20/2024   Brief hospital course: Thomas Moore is a 68 y.o. male with medical history significant of sCHF with EF 30-35%, stroke,  A fib and PE on Eliquis , CAD, s/p of CABG 1997, HTN, HLD, DM, stroke, CKD-3a, SVT, thrombocytopenia, aortic stenosis (s/p of TAVR), bilateral carotid artery stenosis, seizure, chronic back pain, kidney stone (s/p of ESWL), loop recorder, thrombocytopenia, recent non-STEMI, who presents with SOB and chest pain. BNP 27740, troponin 159-->186. He is admitted for CHF exacerbation, NSTEMI. Started  on heparin  drip, IV lasix  therapy, cardiology consulted.  Assessment and Plan: Acute on chronic combined systolic and diastolic congestive heart failure (HCC):  Echo on 05/05/2024 showed EF 35-40%.  X-ray showed some consolidation. BNP [id number]. Clinically fluid overloaded. Continue IV Lasix  40mg  bid. Cardiology input appreciated. Continue home dose metoprolol , imdur . Continue daily weights, strict input and output.   NSTEMI (non-ST elevated myocardial infarction) (HCC) and CAD (coronary artery disease): Troponin 158  --> 186.  S/p of CABG 1997. Patient recently underwent LHC 12/1 with diffuse disease of SVG to OM.  Cardiologist recommended medical management. Chest pain resolved. continue IV heparin  gtt and switch to Eliquis  tomorrow. Continue, plavix , Lipitor , metoprolol , Imdur .   Essential hypertension IV hydralazine  as needed Continue Imdur , metoprolol    HLD (hyperlipidemia) Continue Lipitor    Type II diabetes mellitus with renal manifestations [facility]): Recent A1c 6.2, well-controlled.  Patient is taking Trulicity , Jardiance , Tresiba  10 units daily Continue accucheks, sliding scale, lantus  8 units daily   Chronic kidney disease, stage 3b (HCC): Renal function close to baseline.  Baseline around 2. Continue to monitor as he is on IV  diuresis.   Normocytic anemia: Hemoglobin stable (9.7 on 05/07/2024). Follow-up with daily CBC   COPD (chronic obstructive pulmonary disease) (HCC): No wheezing Continue Bronchodilators.  Mucinex    Hypothyroidism Continue home dose Synthroid .   History of stroke Continue Plavix , Lipitor  He is on eliquis  at home.   History of seizure PRN P.o. Ativan  for seizure Continue home Depakote  250 mg daily. Seizure precautions.   Paroxysmal atrial fibrillation [facility]): Currently heart rate 70-80s Continue Metoprolol , heparin  drip. Switch to Eliquis  tomorrow.   Hx of pulmonary embolus continue IV heparin  instead of Eliquis .     Out of bed to chair. Incentive spirometry. Nursing supportive care. Fall, aspiration precautions. Diet:  Diet Orders (From admission, onward)     Start     Ordered   05/20/24 0940  Diet heart healthy/carb modified Room service appropriate? Yes; Fluid consistency: Thin  Diet effective now       Question Answer Comment  Diet-HS Snack? Nothing   Room service appropriate? Yes   Fluid consistency: Thin      05/20/24 0939           DVT prophylaxis:   Level of care: Telemetry   Code Status: Full Code  Subjective: Patient is seen and examined today morning. He is lying in bed. Chest pain, shortness of breath improved. Has dry cough.  Physical Exam: Vitals:   05/20/24 0530 05/20/24 0651 05/20/24 0800 05/20/24 1408  BP: 137/66  137/65 (!) 104/58  Pulse: 81  76 68  Resp:   14 20  Temp:  98.7 F (37.1 C)  98.8 F (37.1 C)  TempSrc:  Oral  Oral  SpO2: 92%  96% 96%  Weight:  65.3 kg    Height:  5' 2.99 (1.6 m)      General - Elderly Caucasian male, distress due to cough. HEENT - PERRLA, EOMI, atraumatic head, non tender sinuses. Lung - Clear, basal rales, diffuse rhonchi, no wheezes. Heart - S1, S2 heard, no murmurs, rubs, trace pedal edema. Abdomen - Soft, non tender, bowel sounds good Neuro - Alert, awake and oriented, non focal exam. Skin -  Warm and dry.  Data Reviewed:      Latest Ref Rng & Units 05/20/2024    2:30 AM 05/19/2024    4:01 PM 05/07/2024    3:53 AM  CBC  WBC 4.0 - 10.5 K/uL 8.9  8.5  4.4   Hemoglobin 13.0 - 17.0 g/dL 8.8  9.4  9.7   Hematocrit 39.0 - 52.0 % 28.2  31.4  30.1   Platelets 150 - 400 K/uL 213  234  110       Latest Ref Rng & Units 05/20/2024    2:30 AM 05/19/2024    4:01 PM 05/07/2024    7:46 AM  BMP  Glucose 70 - 99 mg/dL 886  787  877   BUN 8 - 23 mg/dL 30  30  50   Creatinine 0.61 - 1.24 mg/dL 8.15  8.18  8.01   Sodium 135 - 145 mmol/L 138  137  137   Potassium 3.5 - 5.1 mmol/L 4.1  4.3  4.0   Chloride 98 - 111 mmol/L 100  101  102   CO2 22 - 32 mmol/L 24  22  24    Calcium  8.9 - 10.3 mg/dL 8.5  8.8  8.5    DG Chest 2 View Result Date: 05/19/2024 EXAM: 2 VIEW(S) XRAY OF THE CHEST 05/19/2024 04:11:44 PM COMPARISON: 05/04/2024 CLINICAL HISTORY: Chest pain and left arm pain. FINDINGS: LINES, TUBES AND DEVICES: A loop recorder is present. LUNGS AND PLEURA: Pulmonary vascular congestion similar to the prior study. Small but increasing bilateral pleural effusions with basilar atelectasis or consolidation also increasing. Mild perihilar edema is suggested. No pneumothorax. HEART AND MEDIASTINUM: Postoperative changes in the mediastinum. Cardiac enlargement similar to the prior study. BONES AND SOFT TISSUES: Degenerative changes in the spine and shoulders. IMPRESSION: 1. Small but increasing bilateral pleural effusions with basilar atelectasis or consolidation also increasing. 2. Cardiac enlargement with pulmonary vascular congestion, similar to the prior study. 3. Mild perihilar edema is suggested. Electronically signed by: Elsie Gravely MD 05/19/2024 04:31 PM EST RP Workstation: [id number]    Family Communication: Discussed with patient, understand and agree. All questions answered.  Disposition: Status is: Inpatient Remains inpatient appropriate because: IV Lasix , IV heparin  drip, monitor  electrolytes, kidney function.  Planned Discharge Destination: Home with Home Health     Time spent: 51 minutes  Author: Concepcion Riser, MD 05/20/2024 3:09 PM Secure chat 7am to 7pm For on call review [email or web address].
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
## 2024-05-20 NOTE — Progress Notes (Signed)
 PHARMACY - ANTICOAGULATION CONSULT NOTE  Pharmacy Consult for Heparin   Indication: chest pain/ACS and atrial fibrillation  Allergies[1]  Patient Measurements:    Vital Signs: Temp: 98.2 F (36.8 C) (12/16 0241) Temp Source: Oral (12/16 0241) BP: 137/66 (12/16 0530) Pulse Rate: 81 (12/16 0530)  Labs: Recent Labs    05/19/24 1601 05/20/24 0230 05/20/24 0527  HGB 9.4* 8.8*  --   HCT 31.4* 28.2*  --   PLT 234 213  --   APTT  --   --  83*  HEPARINUNFRC  --   --  0.96*  CREATININE 1.81* 1.84*  --     Estimated Creatinine Clearance: 30.9 mL/min (A) (by C-G formula based on SCr of 1.84 mg/dL (H)).   Medical History: Past Medical History:  Diagnosis Date   CAD (coronary artery disease) of artery bypass graft    s/p CABG x 4 in 1997   CHF (congestive heart failure) (HCC)    COPD (chronic obstructive pulmonary disease) (HCC)    Coronary artery disease    CVA (cerebral vascular accident) (HCC)    Diabetes mellitus without complication (HCC)    Dysrhythmia    GERD (gastroesophageal reflux disease)    HLD (hyperlipidemia)    Hypertension    S/P TAVR (transcatheter aortic valve replacement) 07/05/2021   with Edwards 26mm S3UR via TF approach with Dr. Wonda and Dr. Lucas   Seizures [facility])     Medications:  (Not in a hospital admission)   Assessment: Pharmacy consulted to dose heparin  in this 68 year old male admitted with ACS/NSTEMI.  Pt was on Eliquis  5 mg PO BID PTA, last dose unknown.    CrCl = 31.4 ml/min   Goal of Therapy:  Heparin  level 0.3-0.7 units/ml aPTT 66 - 102 seconds Monitor platelets by anticoagulation protocol: Yes   Plan:  12/16 @ 0527:  aPTT = 83,   HL = 0.96 - aPTT therapeutic X 1,  HL elevated from Eliquis  PTA - continue pt on current rate and recheck aPTT in 6 hrs - recheck HL on 12/17 with AM labs  - will use aPTT to guide dosing until correlating with HL - CBC daily   Brannon Decaire D 05/20/2024,6:20 AM       [1] No Known  Allergies

## 2024-05-20 NOTE — Progress Notes (Signed)
 PHARMACY - ANTICOAGULATION CONSULT NOTE  Pharmacy Consult for Heparin   Indication: chest pain/ACS and atrial fibrillation  Allergies[1]  Patient Measurements:    Vital Signs: Temp: 98.3 F (36.8 C) (12/15 2340) Temp Source: Oral (12/15 2340) BP: 152/70 (12/15 2300) Pulse Rate: 85 (12/15 2300)  Labs: Recent Labs    05/19/24 1601  HGB 9.4*  HCT 31.4*  PLT 234  CREATININE 1.81*    Estimated Creatinine Clearance: 31.4 mL/min (A) (by C-G formula based on SCr of 1.81 mg/dL (H)).   Medical History: Past Medical History:  Diagnosis Date   CAD (coronary artery disease) of artery bypass graft    s/p CABG x 4 in 1997   CHF (congestive heart failure) (HCC)    COPD (chronic obstructive pulmonary disease) (HCC)    Coronary artery disease    CVA (cerebral vascular accident) (HCC)    Diabetes mellitus without complication (HCC)    Dysrhythmia    GERD (gastroesophageal reflux disease)    HLD (hyperlipidemia)    Hypertension    S/P TAVR (transcatheter aortic valve replacement) 07/05/2021   with Edwards 26mm S3UR via TF approach with Dr. Wonda and Dr. Lucas   Seizures [facility])     Medications:  (Not in a hospital admission)   Assessment: Pharmacy consulted to dose heparin  in this 68 year old male admitted with ACS/NSTEMI.  Pt was on Eliquis  5 mg PO BID PTA, last dose unknown.    CrCl = 31.4 ml/min   Goal of Therapy:  Heparin  level 0.3-0.7 units/ml aPTT 66 - 102 seconds Monitor platelets by anticoagulation protocol: Yes   Plan:  - heparin  infusion ordered to start at 800 units/hr , no bolus.  - will use aPTT to guide dosing until correlating with HL - check aPTT and HL 6 hrs after start of drip - CBC daily   Haisley Arens D 05/20/2024,12:46 AM      [1] No Known Allergies

## 2024-05-20 NOTE — Progress Notes (Signed)
 Heart Failure Navigator Progress Note  Assessed for Heart & Vascular TOC clinic readiness.  Patient does not meet criteria due to current [facility]iology patient.   Navigator will sign off at this time.  Roxy Horseman, RN, BSN [facility] Heart Failure Navigator Secure Chat Only

## 2024-05-20 NOTE — H&P (Incomplete)
 History and Physical    Thomas Moore FMW:[id number] DOB: [birth year] DOA: 05/19/2024  Referring MD/NP/PA:   PCP: Lenon Layman ORN, MD   Patient coming from:  The patient is coming from home.     Chief Complaint: SOB, chest pain  HPI: Thomas Moore is a 68 y.o. male with medical history significant of sCHF with EF 30-35%, stroke,  A fib and PE on Eliquis , CAD, s/p of CABG 1997, HTN, HLD, DM, stroke, CKD-3a, SVT, thrombocytopenia, aortic stenosis (s/p of TAVR), bilateral carotid artery stenosis, seizure, chronic back pain, kidney stone (s/p of ESWL), loop recorder, thrombocytopenia, recent non-STEMI, who presents with SOB and chest pain.  Patient was recently hospitalized from 11/30 - 12/3 due to non-STEMI, troponin was up to 1503.  Patient             Data reviewed independently and ED Course: pt was found to have     ***       EKG: I have personally reviewed.  Not done in ED, will get one.   ***   Review of Systems:   General: no fevers, chills, no body weight gain, has poor appetite, has fatigue HEENT: no blurry vision, hearing changes or sore throat Respiratory: no dyspnea, coughing, wheezing CV: no chest pain, no palpitations GI: no nausea, vomiting, abdominal pain, diarrhea, constipation GU: no dysuria, burning on urination, increased urinary frequency, hematuria  Ext: no leg edema Neuro: no unilateral weakness, numbness, or tingling, no vision change or hearing loss Skin: no rash, no skin tear. MSK: No muscle spasm, no deformity, no limitation of range of movement in spin Heme: No easy bruising.  Travel history: No recent long distant travel.   Allergy: Allergies[1]  Past Medical History:  Diagnosis Date   CAD (coronary artery disease) of artery bypass graft    s/p CABG x 4 in 1997   CHF (congestive heart failure) (HCC)    COPD (chronic obstructive pulmonary disease) (HCC)    Coronary artery disease    CVA (cerebral vascular accident) (HCC)     Diabetes mellitus without complication (HCC)    Dysrhythmia    GERD (gastroesophageal reflux disease)    HLD (hyperlipidemia)    Hypertension    S/P TAVR (transcatheter aortic valve replacement) 07/05/2021   with Edwards 26mm S3UR via TF approach with Dr. Wonda and Dr. Lucas   Seizures [facility])     Past Surgical History:  Procedure Laterality Date   BYPASS GRAFT ANGIOGRAPHY N/A 04/25/2021   Procedure: BYPASS GRAFT ANGIOGRAPHY;  Surgeon: Lawyer Bernardino Cough, MD;  Location: [facility] INVASIVE CV LAB;  Service: Cardiovascular;  Laterality: N/A;   CARDIAC SURGERY     CORNEAL TRANSPLANT Right    CORONARY ARTERY BYPASS GRAFT  1997   ELECTROPHYSIOLOGY STUDY N/A 09/08/2021   Procedure: ELECTROPHYSIOLOGY STUDY;  Surgeon: Cindie Ole DASEN, MD;  Location: [facility] INVASIVE CV LAB;  Service: Cardiovascular;  Laterality: N/A;   EYE SURGERY     FLEXOR TENDON REPAIR Left 07/11/2019   Procedure: FLEXOR tenolysis  REPAIR LEFT RING FINGER with tednon repair;  Surgeon: Kathlynn Sharper, MD;  Location: [facility] ORS;  Service: Orthopedics;  Laterality: Left;   INCISION AND DRAINAGE ABSCESS Left 05/08/2019   Procedure: INCISION AND DRAINAGE ABSCESS;  Surgeon: Mardee Lynwood SQUIBB, MD;  Location: [facility] ORS;  Service: Orthopedics;  Laterality: Left;   INTRAOPERATIVE TRANSTHORACIC ECHOCARDIOGRAM N/A 07/05/2021   Procedure: INTRAOPERATIVE TRANSTHORACIC ECHOCARDIOGRAM;  Surgeon: Wonda Sharper, MD;  [facility];  Service: Open Heart Surgery;  Laterality: N/A;   LEFT HEART CATH AND CORONARY ANGIOGRAPHY Left 06/09/2022   Procedure: LEFT HEART CATH AND CORONARY ANGIOGRAPHY;  Surgeon: Fernand Denyse LABOR, MD;  Location: [facility] INVASIVE CV LAB;  Service: Cardiovascular;  Laterality: Left;   LEFT HEART CATH AND CORONARY ANGIOGRAPHY Left 09/27/2023   Procedure: LEFT HEART CATH AND CORONARY ANGIOGRAPHY with possible coronary intervention;  Surgeon: Fernand Denyse LABOR, MD;  Location: [facility] INVASIVE CV LAB;  Service: Cardiovascular;   Laterality: Left;   LEFT HEART CATH AND CORS/GRAFTS ANGIOGRAPHY N/A 10/02/2019   Procedure: LEFT HEART CATH AND CORS/GRAFTS ANGIOGRAPHY;  Surgeon: Anner Alm ORN, MD;  Location: [facility] INVASIVE CV LAB;  Service: Cardiovascular;  Laterality: N/A;   LEFT HEART CATH AND CORS/GRAFTS ANGIOGRAPHY N/A 07/17/2022   Procedure: LEFT HEART CATH AND CORS/GRAFTS ANGIOGRAPHY and possible PCI and stent;  Surgeon: Fernand Denyse LABOR, MD;  Location: [facility] INVASIVE CV LAB;  Service: Cardiovascular;  Laterality: N/A;   LEFT HEART CATH AND CORS/GRAFTS ANGIOGRAPHY N/A 05/05/2024   Procedure: LEFT HEART CATH AND CORS/GRAFTS ANGIOGRAPHY;  Surgeon: Florencio Cara BIRCH, MD;  Location: [facility] INVASIVE CV LAB;  Service: Cardiovascular;  Laterality: N/A;   LOOP RECORDER INSERTION N/A 10/06/2019   Procedure: LOOP RECORDER INSERTION;  Surgeon: Waddell Danelle ORN, MD;  Location: [facility] INVASIVE CV LAB;  Service: Cardiovascular;  Laterality: N/A;   TRANSCATHETER AORTIC VALVE REPLACEMENT, TRANSFEMORAL N/A 07/05/2021   Procedure: TRANSCATHETER AORTIC VALVE REPLACEMENT, TRANSFEMORAL;  Surgeon: Wonda Sharper, MD;  Location: [facility] OR;  Service: Open Heart Surgery;  Laterality: N/A;   TRIGGER FINGER RELEASE Left     Social History:  reports that he quit smoking about 41 years ago. His smoking use included cigarettes. He has never used smokeless tobacco. He reports that he does not currently use alcohol. He reports that he does not use drugs.  Family History:  Family History  Problem Relation Age of Onset   Seizures Brother      Prior to Admission medications  Medication Sig Start Date End Date Taking? Authorizing Provider  acetaminophen  (TYLENOL ) 650 MG CR tablet Take 1,300 mg by mouth every 8 (eight) hours as needed for pain.    [provider]  amiodarone  (PACERONE ) 100 MG tablet Take 2 tablets (200 mg total) by mouth daily. 05/07/24   Al-Sultani, Anmar, MD  apixaban  (ELIQUIS ) 5 MG TABS tablet Take 1 tablet (5 mg total) by mouth 2  (two) times daily. 11/14/22   Riddle, Suzann, NP  atorvastatin  (LIPITOR ) 80 MG tablet Take 1 tablet (80 mg total) by mouth at bedtime. 08/06/23   Barbarann Nest, MD  Calcium  Citrate-Vitamin D  (CALCIUM  + D PO) Take 1 tablet by mouth 2 (two) times daily.    [provider]  clopidogrel  (PLAVIX ) 75 MG tablet Take 1 tablet by mouth once daily 02/15/24   Fernand Denyse LABOR, MD  cyanocobalamin  (VITAMIN B12) 1000 MCG tablet Take 1 tablet (1,000 mcg total) by mouth daily. 03/20/24   Amin, Sumayya, MD  cyclopentolate (CYCLODRYL,CYCLOGYL) 1 % ophthalmic solution Place 1 drop into the left eye 2 (two) times daily.    [provider]  diclofenac Sodium (VOLTAREN) 1 % GEL Apply 4 g topically 4 (four) times daily. 02/21/24 02/20/25  [provider]  divalproex  (DEPAKOTE ) 250 MG DR tablet Take 250 mg by mouth at bedtime. 04/29/24   [provider]  erythromycin ophthalmic ointment Place 1 Application into both eyes 3 (three) times daily. 03/05/24 06/03/24  [provider]  insulin  degludec (TRESIBA ) 100 UNIT/ML FlexTouch Pen  Inject 10 Units into the skin daily. 07/11/23   [provider]  isosorbide  mononitrate (IMDUR ) 30 MG 24 hr tablet Take 0.5 tablets (15 mg total) by mouth daily. 05/08/24   Al-Sultani, Anmar, MD  JARDIANCE  25 MG TABS tablet Take 25 mg by mouth daily.    [provider]  levothyroxine  (SYNTHROID ) 50 MCG tablet Take 50 mcg by mouth daily before breakfast. 04/28/24 04/28/25  [provider]  metoprolol  succinate (TOPROL  XL) 25 MG 24 hr tablet Take 0.5 tablets (12.5 mg total) by mouth daily. 03/24/24 03/24/25  Fernand Denyse LABOR, MD  moxifloxacin (VIGAMOX) 0.5 % ophthalmic solution Place 1 drop into the left eye 3 (three) times daily. 04/29/24   [provider]  Multiple Vitamin (MULTIVITAMIN) capsule Take 1 capsule by mouth daily.    [provider]  mupirocin ointment (BACTROBAN) 2 % Apply 1 Application topically 2 (two)  times daily. 02/13/24   [provider]  nitroGLYCERIN  (NITROSTAT ) 0.4 MG SL tablet Place 1 tablet (0.4 mg total) under the tongue every 5 (five) minutes as needed for chest pain. 08/03/23   Scoggins, Amber, NP  ofloxacin  (OCUFLOX ) 0.3 % ophthalmic solution Place 1 drop into the left eye 4 (four) times daily. 01/23/24   [provider]  OXERVATE  0.002 % SOLN Place 1 drop into both eyes 6 (six) times daily. 04/22/24   [provider]  prednisoLONE  acetate (PRED FORTE ) 1 % ophthalmic suspension Place 1 drop into the left eye 4 (four) times daily. 01/16/24   [provider]  [Paused] sacubitril -valsartan  (ENTRESTO ) 24-26 MG Take 1 tablet by mouth 2 (two) times daily. Wait to take this until your doctor or other care provider tells you to start again. 01/22/24   Fernand Denyse LABOR, MD  torsemide  (DEMADEX ) 20 MG tablet Take 1 tablet (20 mg total) by mouth daily. 03/20/24   Caleen Qualia, MD  TRULICITY  4.5 MG/0.5ML SOAJ Inject 4.5 mg into the skin once a week. 02/12/24   [provider]    Physical Exam: Vitals:   05/19/24 1549 05/19/24 1900 05/19/24 1913 05/19/24 1953  BP: 130/64 131/68    Pulse: 84 77 77   Resp: 16 (!) 24 (!) 23   Temp: 98.5 F (36.9 C)   98.4 F (36.9 C)  TempSrc: Oral   Oral  SpO2: 93% 91% 92%    General: Not in acute distress HEENT:       Eyes: PERRL, EOMI, no jaundice       ENT: No discharge from the ears and nose, no pharynx injection, no tonsillar enlargement.        Neck: No JVD, no bruit, no mass felt. Heme: No neck lymph node enlargement. Cardiac: S1/S2, RRR, No murmurs, No gallops or rubs. Respiratory: No rales, wheezing, rhonchi or rubs. GI: Soft, nondistended, nontender, no rebound pain, no organomegaly, BS present. GU: No hematuria Ext: No pitting leg edema bilaterally. 1+DP/PT pulse bilaterally. Musculoskeletal: No joint deformities, No joint redness or warmth, no limitation of ROM in spin. Skin: No rashes.  Neuro:  Alert, oriented X3, cranial nerves II-XII grossly intact, moves all extremities normally. Muscle strength 5/5 in all extremities, sensation to light touch intact. Brachial reflex 2+ bilaterally. Knee reflex 1+ bilaterally. Negative Babinski's sign. Normal finger to nose test. Psych: Patient is not psychotic, no suicidal or hemocidal ideation.  Labs on Admission: I have personally reviewed following labs and imaging studies  CBC: Recent Labs  Lab 05/19/24 1601  WBC 8.5  HGB 9.4*  HCT 31.4*  MCV 94.6  PLT 234   Basic Metabolic Panel: Recent Labs  Lab 05/19/24 1601  NA 137  K 4.3  CL 101  CO2 22  GLUCOSE 212*  BUN 30*  CREATININE 1.81*  CALCIUM  8.8*   GFR: Estimated Creatinine Clearance: 31.4 mL/min (A) (by C-G formula based on SCr of 1.81 mg/dL (H)). Liver Function Tests: No results for input(s): AST, ALT, ALKPHOS, BILITOT, PROT, ALBUMIN in the last 168 hours. No results for input(s): LIPASE, AMYLASE in the last 168 hours. No results for input(s): AMMONIA in the last 168 hours. Coagulation Profile: No results for input(s): INR, PROTIME in the last 168 hours. Cardiac Enzymes: No results for input(s): CKTOTAL, CKMB, CKMBINDEX, TROPONINI in the last 168 hours. BNP (last 3 results) Recent Labs    05/19/24 1601  PROBNP 27,740.0*   HbA1C: No results for input(s): HGBA1C in the last 72 hours. CBG: No results for input(s): GLUCAP in the last 168 hours. Lipid Profile: No results for input(s): CHOL, HDL, LDLCALC, TRIG, CHOLHDL, LDLDIRECT in the last 72 hours. Thyroid  Function Tests: No results for input(s): TSH, T4TOTAL, FREET4, T3FREE, THYROIDAB in the last 72 hours. Anemia Panel: No results for input(s): VITAMINB12, FOLATE, FERRITIN, TIBC, IRON, RETICCTPCT in the last 72 hours. Urine analysis:    Component Value Date/Time   COLORURINE YELLOW (A) 05/19/2024 1934   APPEARANCEUR HAZY (A) 05/19/2024 1934    APPEARANCEUR Clear 05/12/2020 0940   LABSPEC 1.025 05/19/2024 1934   PHURINE 5.0 05/19/2024 1934   GLUCOSEU >=500 (A) 05/19/2024 1934   HGBUR NEGATIVE 05/19/2024 1934   BILIRUBINUR NEGATIVE 05/19/2024 1934   BILIRUBINUR Negative 05/12/2020 0940   KETONESUR NEGATIVE 05/19/2024 1934   PROTEINUR 30 (A) 05/19/2024 1934   NITRITE NEGATIVE 05/19/2024 1934   LEUKOCYTESUR NEGATIVE 05/19/2024 1934   Sepsis Labs: @LABRCNTIP (procalcitonin:4,lacticidven:4) ) Recent Results (from the past 240 hours)  Resp panel by RT-PCR (RSV, Flu A&B, Covid) Anterior Nasal Swab     Status: None   Collection Time: 05/19/24  6:44 PM   Specimen: Anterior Nasal Swab  Result Value Ref Range Status   SARS Coronavirus 2 by RT PCR NEGATIVE NEGATIVE Final    Comment: (NOTE) SARS-CoV-2 target nucleic acids are NOT DETECTED.  The SARS-CoV-2 RNA is generally detectable in upper respiratory specimens during the acute phase of infection. The lowest concentration of SARS-CoV-2 viral copies this assay can detect is 138 copies/mL. A negative result does not preclude SARS-Cov-2 infection and should not be used as the sole basis for treatment or other patient management decisions. A negative result may occur with  improper specimen collection/handling, submission of specimen other than nasopharyngeal swab, presence of viral mutation(s) within the areas targeted by this assay, and inadequate number of viral copies(<138 copies/mL). A negative result must be combined with clinical observations, patient history, and epidemiological information. The expected result is Negative.  Fact Sheet for Patients:  bloggercourse.com  Fact Sheet for Healthcare Providers:  seriousbroker.it  This test is no t yet approved or cleared by the [place]  FDA and  has been authorized for detection and/or diagnosis of SARS-CoV-2 by FDA under an Emergency Use Authorization (EUA). This EUA  will remain  in effect (meaning this test can be used) for the duration of the COVID-19 declaration under Section 564(b)(1) of the Act, 21 U.S.C.section 360bbb-3(b)(1), unless the authorization is terminated  or revoked sooner.       Influenza A by PCR NEGATIVE NEGATIVE Final   Influenza B by PCR NEGATIVE  NEGATIVE Final    Comment: (NOTE) The Xpert Xpress SARS-CoV-2/FLU/RSV plus assay is intended as an aid in the diagnosis of influenza from Nasopharyngeal swab specimens and should not be used as a sole basis for treatment. Nasal washings and aspirates are unacceptable for Xpert Xpress SARS-CoV-2/FLU/RSV testing.  Fact Sheet for Patients: bloggercourse.com  Fact Sheet for Healthcare Providers: seriousbroker.it  This test is not yet approved or cleared by the [place]  FDA and has been authorized for detection and/or diagnosis of SARS-CoV-2 by FDA under an Emergency Use Authorization (EUA). This EUA will remain in effect (meaning this test can be used) for the duration of the COVID-19 declaration under Section 564(b)(1) of the Act, 21 U.S.C. section 360bbb-3(b)(1), unless the authorization is terminated or revoked.     Resp Syncytial Virus by PCR NEGATIVE NEGATIVE Final    Comment: (NOTE) Fact Sheet for Patients: bloggercourse.com  Fact Sheet for Healthcare Providers: seriousbroker.it  This test is not yet approved or cleared by the [place]  FDA and has been authorized for detection and/or diagnosis of SARS-CoV-2 by FDA under an Emergency Use Authorization (EUA). This EUA will remain in effect (meaning this test can be used) for the duration of the COVID-19 declaration under Section 564(b)(1) of the Act, 21 U.S.C. section 360bbb-3(b)(1), unless the authorization is terminated or revoked.  Performed at [facility], [address] [place], [place]  [address]      Radiological Exams on Admission:   Assessment/Plan Active Problems:   * No active hospital problems. *   Assessment and Plan: No notes have been filed under this hospital service. Service: Hospitalist      Active Problems:   * No active hospital problems. *    DVT ppx: SQ Heparin          SQ Lovenox   Code Status: Full code   ***  Family Communication:     not done, no family member is at bed side.              Yes, patient's    at bed side.       by phone   ***  Disposition Plan:  Anticipate discharge back to previous environment  Consults called:    Admission status and Level of care: :    for obs as inpt        Dispo: The patient is from: {From:23814}              Anticipated d/c is to: {To:23815}              Anticipated d/c date is: {Days:23816}              Patient currently {Medically stable:23817}    Severity of Illness:  {Observation/Inpatient:21159}       Date of Service 05/19/2024    Caleb Exon [facility]   If 7PM-7AM, please contact night-coverage [email or web address] 05/19/2024, 10:10 PM       [1] No Known Allergies

## 2024-05-21 ENCOUNTER — Ambulatory Visit

## 2024-05-21 DIAGNOSIS — I5043 Acute on chronic combined systolic (congestive) and diastolic (congestive) heart failure: Secondary | ICD-10-CM | POA: Diagnosis not present

## 2024-05-21 DIAGNOSIS — R55 Syncope and collapse: Secondary | ICD-10-CM

## 2024-05-21 LAB — CBG MONITORING, ED
Glucose-Capillary: 107 mg/dL — ABNORMAL HIGH (ref 70–99)
Glucose-Capillary: 186 mg/dL — ABNORMAL HIGH (ref 70–99)
Glucose-Capillary: 129 mg/dL — ABNORMAL HIGH (ref 70–99)
Glucose-Capillary: 143 mg/dL — ABNORMAL HIGH (ref 70–99)

## 2024-05-21 LAB — BASIC METABOLIC PANEL WITH GFR
Anion gap: 13 (ref 5–15)
BUN: 31 mg/dL — ABNORMAL HIGH (ref 8–23)
CO2: 29 mmol/L (ref 22–32)
Calcium: 8.3 mg/dL — ABNORMAL LOW (ref 8.9–10.3)
Chloride: 96 mmol/L — ABNORMAL LOW (ref 98–111)
Creatinine, Ser: 1.93 mg/dL — ABNORMAL HIGH (ref 0.61–1.24)
GFR, Estimated: 37 mL/min — ABNORMAL LOW (ref >60)
Glucose, Bld: 99 mg/dL (ref 70–99)
Potassium: 3.7 mmol/L (ref 3.5–5.1)
Sodium: 137 mmol/L (ref 135–145)

## 2024-05-21 LAB — CBC
HCT: 30.3 % — ABNORMAL LOW (ref 39.0–52.0)
Hemoglobin: 9.6 g/dL — ABNORMAL LOW (ref 13.0–17.0)
MCH: 28.3 pg (ref 26.0–34.0)
MCHC: 31.7 g/dL (ref 30.0–36.0)
MCV: 89.4 fL (ref 80.0–100.0)
Platelets: 205 K/uL (ref 150–400)
RBC: 3.39 MIL/uL — ABNORMAL LOW (ref 4.22–5.81)
RDW: 16.2 % — ABNORMAL HIGH (ref 11.5–15.5)
WBC: 6.6 K/uL (ref 4.0–10.5)
nRBC: 0 % (ref 0.0–0.2)

## 2024-05-21 LAB — APTT: aPTT: 100 s — ABNORMAL HIGH (ref 24–36)

## 2024-05-21 LAB — HEPARIN LEVEL (UNFRACTIONATED): Heparin Unfractionated: 0.72 [IU]/mL — ABNORMAL HIGH (ref 0.30–0.70)

## 2024-05-21 MED ORDER — APIXABAN 5 MG PO TABS
5.0000 mg | ORAL_TABLET | Freq: Two times a day (BID) | ORAL | Status: DC
  Administered 2024-05-21 – 2024-05-24 (×7): 5 mg via ORAL
  Filled 2024-05-21 (×7): qty 1

## 2024-05-21 MED ORDER — ISOSORBIDE MONONITRATE ER 30 MG PO TB24
30.0000 mg | ORAL_TABLET | Freq: Every day | ORAL | Status: DC
  Administered 2024-05-22 – 2024-05-24 (×3): 30 mg via ORAL
  Filled 2024-05-21 (×4): qty 1

## 2024-05-21 MED ORDER — SPIRONOLACTONE 12.5 MG HALF TABLET
12.5000 mg | ORAL_TABLET | Freq: Every day | ORAL | Status: DC
  Administered 2024-05-21: 10:00:00 12.5 mg via ORAL
  Filled 2024-05-21 (×2): qty 1

## 2024-05-21 NOTE — ED Notes (Signed)
 Pt resting in bed, provided with diet drink per request, denies any additional needs at this time, NAD noted, call bell within reach
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
## 2024-05-21 NOTE — ED Notes (Signed)
 Pt eating breakfast at this time.

## 2024-05-21 NOTE — Progress Notes (Signed)
 PHARMACY - ANTICOAGULATION CONSULT NOTE  Pharmacy Consult for Heparin   Indication: chest pain/ACS and atrial fibrillation  Allergies[1]  Patient Measurements: Height: 5' 2.99 (160 cm) Weight: 65.3 kg (144 lb) IBW/kg (Calculated) : 56.88 HEPARIN  DW (KG): 65.3  Vital Signs: Temp: 97.6 F (36.4 C) (12/17 0139) Temp Source: Axillary (12/17 0139) BP: 104/54 (12/17 0139) Pulse Rate: 69 (12/17 0139)  Labs: Recent Labs    05/19/24 1601 05/20/24 0230 05/20/24 0527 05/20/24 0527 05/20/24 1230 05/20/24 2052 05/21/24 0317  HGB 9.4* 8.8*  --   --   --   --  9.6*  HCT 31.4* 28.2*  --   --   --   --  30.3*  PLT 234 213  --   --   --   --  205  APTT  --   --  83*   < > 57* 76* 100*  HEPARINUNFRC  --   --  0.96*  --   --   --  0.72*  CREATININE 1.81* 1.84*  --   --   --   --  1.93*   < > = values in this interval not displayed.    Estimated Creatinine Clearance: 29.5 mL/min (A) (by C-G formula based on SCr of 1.93 mg/dL (H)).   Medical History: Past Medical History:  Diagnosis Date   CAD (coronary artery disease) of artery bypass graft    s/p CABG x 4 in 1997   CHF (congestive heart failure) (HCC)    COPD (chronic obstructive pulmonary disease) (HCC)    Coronary artery disease    CVA (cerebral vascular accident) (HCC)    Diabetes mellitus without complication (HCC)    Dysrhythmia    GERD (gastroesophageal reflux disease)    HLD (hyperlipidemia)    Hypertension    S/P TAVR (transcatheter aortic valve replacement) 07/05/2021   with Edwards 26mm S3UR via TF approach with Dr. Wonda and Dr. Lucas   Seizures [facility])     Medications:  (Not in a hospital admission)   Assessment: Pharmacy consulted to dose heparin  in this 68 year old male admitted with ACS/NSTEMI.  Pt was on Eliquis  5 mg PO BID PTA, last dose unknown.    CrCl = 31.4 ml/min   Goal of Therapy:  Heparin  level 0.3-0.7 units/ml aPTT 66 - 102 seconds Monitor platelets by anticoagulation protocol:  Yes  12/16 0527: aPTT 83, therapeutic x 1, HL 0.96 12/16 1230: aPTT 57, SUBtherapeutic 12/16 2052: aPTT 76, therapeutic x 1 12/17 0317: aPTT 100, therapeutic X 2    Plan:  12/17 @ 0317: aPTT = 100,  HL = 0.72 - aPTT therapeutic X 2,  not correlating with HL  - continue pt on current rate and recheck aPTT and HL on 12/18 with AM labs - Daily CBC while on heparin   Thomas Moore D [place] [facility] 05/21/2024 5:00 AM         [1] No Known Allergies

## 2024-05-21 NOTE — ED Notes (Addendum)
 Report received from off going RN. Pt resting in bed, denies any needs at this time, NAD noted, call bell within reach

## 2024-05-21 NOTE — Progress Notes (Signed)
 Progress Note   Patient: Thomas Moore FMW:[id number] DOB: 1956/02/28 DOA: 05/19/2024     2 DOS: the patient was seen and examined on 05/21/2024    Brief hospital course: Thomas Moore is a 68 y.o. male with medical history significant of sCHF with EF 30-35%, stroke,  A fib and PE on Eliquis , CAD, s/p of CABG 1997, HTN, HLD, DM, stroke, CKD-3a, SVT, thrombocytopenia, aortic stenosis (s/p of TAVR), bilateral carotid artery stenosis, seizure, chronic back pain, kidney stone (s/p of ESWL), loop recorder, thrombocytopenia, recent non-STEMI, who presents with SOB and chest pain. BNP 27740, troponin 159-->186. He is admitted for CHF exacerbation, NSTEMI. Started  on heparin  drip, IV lasix  therapy, cardiology consulted.   Assessment and Plan: Acute on chronic combined systolic and diastolic congestive heart failure (HCC):  Echo on 05/05/2024 showed EF 35-40%.  X-ray showed some consolidation. BNP [id number]. Clinically fluid overloaded. Continue IV Lasix  40mg  bid. Cardiology input appreciated. Continue home dose metoprolol , imdur . Continue daily weights, strict input and output.   NSTEMI (non-ST elevated myocardial infarction) (HCC) and CAD (coronary artery disease): Troponin 158  --> 186.  S/p of CABG 1997. Patient recently underwent LHC 12/1 with diffuse disease of SVG to OM.  Cardiologist recommended medical management. Chest pain resolved. continue IV heparin  gtt and switch to Eliquis  tomorrow. Continue, plavix , Lipitor , metoprolol , Imdur .   Essential hypertension IV hydralazine  as needed Continue Imdur , metoprolol    HLD (hyperlipidemia) Continue Lipitor    Type II diabetes mellitus with renal manifestations (HCC): Recent A1c 6.2, well-controlled.  Patient is taking Trulicity , Jardiance , Tresiba  10 units daily Continue accucheks, sliding scale, lantus  8 units daily   Chronic kidney disease, stage 3b (HCC): Renal function close to baseline.  Baseline around 2. Continue to monitor as he is on  IV diuresis.   Normocytic anemia: Hemoglobin stable (9.7 on 05/07/2024). Follow-up with daily CBC   COPD (chronic obstructive pulmonary disease) (HCC): No wheezing Continue Bronchodilators.  Mucinex    Hypothyroidism Continue home dose Synthroid .   History of stroke Continue Plavix , Lipitor  He is on eliquis  at home.   History of seizure PRN P.o. Ativan  for seizure Continue home Depakote  250 mg daily. Seizure precautions.   Paroxysmal atrial fibrillation [facility]): Currently heart rate 70-80s Continue Metoprolol , heparin  drip. Switch to Eliquis  tomorrow.   Hx of pulmonary embolus continue IV heparin  instead of Eliquis .  Physical Exam: General - Elderly Caucasian male, distress due to cough. HEENT - PERRLA, EOMI, atraumatic head, non tender sinuses. Lung - Clear, basal rales, diffuse rhonchi, no wheezes. Heart - S1, S2 heard, no murmurs, rubs, trace pedal edema. Abdomen - Soft, non tender, bowel sounds good Neuro - Alert, awake and oriented, non focal exam. Skin - Warm and dry.  Vitals:   05/21/24 0930 05/21/24 1030 05/21/24 1100 05/21/24 1438  BP: (!) 108/58 (!) 102/50 (!) 120/57 115/61  Pulse: 72 70 73 71  Resp: 16 (!) 21 (!) 21 20  Temp:   98.3 F (36.8 C) 97.6 F (36.4 C)  TempSrc:   Oral Oral  SpO2: 93% 91% 94% 96%  Weight:      Height:        Data Reviewed:     Latest Ref Rng & Units 05/21/2024    3:17 AM 05/20/2024    2:30 AM 05/19/2024    4:01 PM  CBC  WBC 4.0 - 10.5 K/uL 6.6  8.9  8.5   Hemoglobin 13.0 - 17.0 g/dL 9.6  8.8  9.4   Hematocrit 39.0 - 52.0 %  30.3  28.2  31.4   Platelets 150 - 400 K/uL 205  213  234        Latest Ref Rng & Units 05/21/2024    3:17 AM 05/20/2024    2:30 AM 05/19/2024    4:01 PM  BMP  Glucose 70 - 99 mg/dL 99  886  787   BUN 8 - 23 mg/dL 31  30  30    Creatinine 0.61 - 1.24 mg/dL 8.06  8.15  8.18   Sodium 135 - 145 mmol/L 137  138  137   Potassium 3.5 - 5.1 mmol/L 3.7  4.1  4.3   Chloride 98 - 111 mmol/L 96  100  101    CO2 22 - 32 mmol/L 29  24  22    Calcium  8.9 - 10.3 mg/dL 8.3  8.5  8.8        Author: Drue ONEIDA Potter, MD 05/21/2024 6:06 PM  For on call review [email or web address].

## 2024-05-21 NOTE — ED Notes (Signed)
 MD at bedside.

## 2024-05-21 NOTE — Evaluation (Signed)
 Physical Therapy Evaluation Patient Details Name: Thomas Moore MRN: [id number] DOB: 04-13-1956 Today's Date: 05/21/2024  History of Present Illness  Thomas Moore is a 68 y.o. male  with a past medical history of coronary artery disease s/p CABG (1997), chronic HFrEF (30-35% - 08/2023), paroxysmal atrial fibrillation (on Eliquis ), hypertension, hyperlipidemia, diabetes, history stroke, history of SVT, CKD stage IIIa, aortic stenosis s/p TAVR, history of thrombocytopenia, bilateral carotid artery stenosis, s/p loop recorder implant who was here 05/04/2024 for left arm pain similar to prior MIs.  Now returns with shortness of breath and chest pain.  Clinical Impression  Pt generally anxious about what he will be able to do, but ultimately he was able to do bed mobility, get to standing and do some limited ambulation all w/o excessive assist.  He did not display any overt safety issues but generally is is far from his normally independent baseline and does not feel as though he could manage at home with his normal level of assist and activity.  Pt will benefit from continued PT to address functional limitations.       If plan is discharge home, recommend the following: A little help with walking and/or transfers;A little help with bathing/dressing/bathroom;Assistance with cooking/housework;Assist for transportation   Can travel by private vehicle   Yes    Equipment Recommendations None recommended by PT  Recommendations for Other Services       Functional Status Assessment Patient has had a recent decline in their functional status and demonstrates the ability to make significant improvements in function in a reasonable and predictable amount of time.     Precautions / Restrictions Precautions Precautions: Fall Restrictions Weight Bearing Restrictions Per Provider Order: No      Mobility  Bed Mobility Overal bed mobility: Needs Assistance Bed Mobility: Supine to Sit, Sit to  Supine     Supine to sit: Contact guard Sit to supine: Contact guard assist   General bed mobility comments: assist to manage lines/leads and motivation to try w/o assist and he needed only light CGA guidance    Transfers Overall transfer level: Needs assistance Equipment used: Rolling walker (2 wheels) Transfers: Sit to/from Stand Sit to Stand: Min assist           General transfer comment: Pt struggled to rise from standard height bed, after multiple rocking attempts he attained standing with minA to insure forward weight shift continued with the rise to standing    Ambulation/Gait Ambulation/Gait assistance: Min assist Gait Distance (Feet): 40 Feet Assistive device: Rolling walker (2 wheels)         General Gait Details: Pt with hesitant, slow, shuffling gait with walker.  He endorses some fatigue and SpO2 dropped from mid 90s to high 80s with the modest effort.  Pt reports that until recently he was able to do much longer bouts of ambulation before fatigue  Stairs            Wheelchair Mobility     Tilt Bed    Modified Rankin (Stroke Patients Only)       Balance Overall balance assessment: Needs assistance Sitting-balance support: No upper extremity supported, Feet supported Sitting balance-Leahy Scale: Good     Standing balance support: Bilateral upper extremity supported Standing balance-Leahy Scale: Fair Standing balance comment: clearly reliant on walker, hesitant with dynamic activity  Pertinent Vitals/Pain Pain Assessment Pain Assessment: Faces Faces Pain Scale: Hurts a little bit Pain Location: Pt with slight hesitant with movement but reports chest pain is gone    Home Living Family/patient expects to be discharged to:: Skilled nursing facility                   Additional Comments: Pt reports that managing in his apartment has become more and more difficult, now needing to use a walker and  having a harder time getting to appointments, managing meds, etc    Prior Function Prior Level of Function : Independent/Modified Independent             Mobility Comments: relying on walker/rollator more recently and getting out less (church, etc) ADLs Comments: typically has been independent but reports oranizing meds, doing meals, etc are more and more difficult     Extremity/Trunk Assessment   Upper Extremity Assessment Upper Extremity Assessment: Generalized weakness    Lower Extremity Assessment Lower Extremity Assessment: Generalized weakness       Communication   Communication Communication: Impaired Factors Affecting Communication: Hearing impaired    Cognition Arousal: Alert Behavior During Therapy: Restless, Anxious   PT - Cognitive impairments: No apparent impairments                         Following commands: Intact       Cueing       General Comments General comments (skin integrity, edema, etc.): pt anxious about being able to manage at home alone    Exercises     Assessment/Plan    PT Assessment Patient needs continued PT services  PT Problem List Decreased strength;Decreased mobility;Decreased activity tolerance;Decreased balance       PT Treatment Interventions DME instruction;Therapeutic exercise;Gait training;Balance training;Neuromuscular re-education;Functional mobility training;Therapeutic activities;Patient/family education    PT Goals (Current goals can be found in the Care Plan section)  Acute Rehab PT Goals Patient Stated Goal: get stronger PT Goal Formulation: With patient Time For Goal Achievement: 06/03/24 Potential to Achieve Goals: Good    Frequency Min 2X/week     Co-evaluation               AM-PAC PT 6 Clicks Mobility  Outcome Measure Help needed turning from your back to your side while in a flat bed without using bedrails?: None Help needed moving from lying on your back to sitting on the  side of a flat bed without using bedrails?: A Little Help needed moving to and from a bed to a chair (including a wheelchair)?: A Little Help needed standing up from a chair using your arms (e.g., wheelchair or bedside chair)?: A Little Help needed to walk in hospital room?: A Little Help needed climbing 3-5 steps with a railing? : A Lot 6 Click Score: 18    End of Session Equipment Utilized During Treatment: Gait belt Activity Tolerance: Patient tolerated treatment well Patient left: with call bell/phone within reach;with bed alarm set Nurse Communication: Mobility status PT Visit Diagnosis: Other abnormalities of gait and mobility (R26.89);Muscle weakness (generalized) (M62.81);Difficulty in walking, not elsewhere classified (R26.2)    Time: 8369-8342 PT Time Calculation (min) (ACUTE ONLY): 27 min   Charges:   PT Evaluation $PT Eval Low Complexity: 1 Low PT Treatments $Gait Training: 8-22 mins PT General Charges $$ ACUTE PT VISIT: 1 Visit         Carmin JONELLE Deed, DPT 05/21/2024, 5:57 PM

## 2024-05-22 DIAGNOSIS — I5043 Acute on chronic combined systolic (congestive) and diastolic (congestive) heart failure: Secondary | ICD-10-CM | POA: Diagnosis not present

## 2024-05-22 LAB — CUP PACEART REMOTE DEVICE CHECK
Date Time Interrogation Session: 20251216230744
Implantable Pulse Generator Implant Date: 20210503

## 2024-05-22 LAB — CBC WITH DIFFERENTIAL/PLATELET
Abs Immature Granulocytes: 0.05 K/uL (ref 0.00–0.07)
Basophils Absolute: 0 K/uL (ref 0.0–0.1)
Basophils Relative: 0 %
Eosinophils Absolute: 0 K/uL (ref 0.0–0.5)
Eosinophils Relative: 0 %
HCT: 32.1 % — ABNORMAL LOW (ref 39.0–52.0)
Hemoglobin: 9.8 g/dL — ABNORMAL LOW (ref 13.0–17.0)
Immature Granulocytes: 1 %
Lymphocytes Relative: 7 %
Lymphs Abs: 0.5 K/uL — ABNORMAL LOW (ref 0.7–4.0)
MCH: 27.8 pg (ref 26.0–34.0)
MCHC: 30.5 g/dL (ref 30.0–36.0)
MCV: 91.2 fL (ref 80.0–100.0)
Monocytes Absolute: 0.7 K/uL (ref 0.1–1.0)
Monocytes Relative: 9 %
Neutro Abs: 5.7 K/uL (ref 1.7–7.7)
Neutrophils Relative %: 83 %
Platelets: 220 K/uL (ref 150–400)
RBC: 3.52 MIL/uL — ABNORMAL LOW (ref 4.22–5.81)
RDW: 16.1 % — ABNORMAL HIGH (ref 11.5–15.5)
WBC: 6.9 K/uL (ref 4.0–10.5)
nRBC: 0 % (ref 0.0–0.2)

## 2024-05-22 LAB — BASIC METABOLIC PANEL WITH GFR
Anion gap: 12 (ref 5–15)
BUN: 29 mg/dL — ABNORMAL HIGH (ref 8–23)
CO2: 27 mmol/L (ref 22–32)
Calcium: 8.4 mg/dL — ABNORMAL LOW (ref 8.9–10.3)
Chloride: 96 mmol/L — ABNORMAL LOW (ref 98–111)
Creatinine, Ser: 1.85 mg/dL — ABNORMAL HIGH (ref 0.61–1.24)
GFR, Estimated: 39 mL/min — ABNORMAL LOW (ref >60)
Glucose, Bld: 104 mg/dL — ABNORMAL HIGH (ref 70–99)
Potassium: 4 mmol/L (ref 3.5–5.1)
Sodium: 135 mmol/L (ref 135–145)

## 2024-05-22 LAB — GLUCOSE, CAPILLARY
Glucose-Capillary: 109 mg/dL — ABNORMAL HIGH (ref 70–99)
Glucose-Capillary: 173 mg/dL — ABNORMAL HIGH (ref 70–99)
Glucose-Capillary: 153 mg/dL — ABNORMAL HIGH (ref 70–99)
Glucose-Capillary: 155 mg/dL — ABNORMAL HIGH (ref 70–99)
Glucose-Capillary: 131 mg/dL — ABNORMAL HIGH (ref 70–99)

## 2024-05-22 MED ORDER — SENNA 8.6 MG PO TABS
1.0000 | ORAL_TABLET | Freq: Every day | ORAL | Status: DC
  Administered 2024-05-22 – 2024-05-23 (×2): 8.6 mg via ORAL
  Filled 2024-05-22 (×2): qty 1

## 2024-05-22 MED ORDER — TORSEMIDE 20 MG PO TABS
20.0000 mg | ORAL_TABLET | Freq: Every day | ORAL | Status: DC
  Administered 2024-05-22 – 2024-05-24 (×3): 20 mg via ORAL
  Filled 2024-05-22 (×3): qty 1

## 2024-05-22 MED ORDER — SPIRONOLACTONE 25 MG PO TABS
25.0000 mg | ORAL_TABLET | Freq: Every day | ORAL | Status: DC
  Administered 2024-05-22 – 2024-05-24 (×3): 25 mg via ORAL
  Filled 2024-05-22 (×3): qty 1

## 2024-05-22 MED ORDER — POLYETHYLENE GLYCOL 3350 17 G PO PACK
17.0000 g | PACK | Freq: Every day | ORAL | Status: DC
  Administered 2024-05-24: 17 g via ORAL
  Filled 2024-05-22 (×2): qty 1
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
## 2024-05-22 NOTE — Progress Notes (Signed)
 Remote Loop Recorder Transmission

## 2024-05-22 NOTE — Progress Notes (Signed)
 Progress Note   Patient: Thomas Moore FMW:[id number] DOB: August 20, 1955 DOA: 05/19/2024     3 DOS: the patient was seen and examined on 05/22/2024     Brief hospital course: From HPI [name] is a 68 y.o. male with medical history significant of sCHF with EF 30-35%, stroke,  A fib and PE on Eliquis , CAD, s/p of CABG 1997, HTN, HLD, DM, stroke, CKD-3a, SVT, thrombocytopenia, aortic stenosis (s/p of TAVR), bilateral carotid artery stenosis, seizure, chronic back pain, kidney stone (s/p of ESWL), loop recorder, thrombocytopenia, recent non-STEMI, who presents with SOB and chest pain. BNP 27740, troponin 159-->186. He is admitted for CHF exacerbation, NSTEMI. Started  on heparin  drip, IV lasix  therapy, cardiology consulted.     Assessment and Plan: Acute on chronic combined systolic and diastolic congestive heart failure (HCC):  Echo on 05/05/2024 showed EF 35-40%.  X-ray showed some consolidation. BNP [id number]. Clinically fluid overloaded. Transition to p.o. torsemide  Cardiology input appreciated. Continue home dose metoprolol , imdur . Continue daily weights, strict input and output. PT OT recommending SNF TOC working on this   NSTEMI (non-ST elevated myocardial infarction) (HCC) and CAD (coronary artery disease): Troponin 158  --> 186.  S/p of CABG 1997. Patient recently underwent LHC 12/1 with diffuse disease of SVG to OM.  Cardiologist recommended medical management. Chest pain resolved. Continue Eliquis  Continue, plavix , Lipitor , metoprolol , Imdur .   Essential hypertension IV hydralazine  as needed Continue Imdur , metoprolol    HLD (hyperlipidemia) Continue Lipitor    Type II diabetes mellitus with renal manifestations [facility]): Recent A1c 6.2, well-controlled.  Patient is taking Trulicity , Jardiance , Tresiba  10 units daily Continue accucheks, sliding scale, lantus  8 units daily   Chronic kidney disease, stage 3b (HCC): Renal function close to baseline.  Baseline around 2. Continue to  monitor as he is on IV diuresis.   Normocytic anemia: Hemoglobin stable (9.7 on 05/07/2024). Follow-up with daily CBC   COPD (chronic obstructive pulmonary disease) (HCC): No wheezing Continue Bronchodilators.  Mucinex    Hypothyroidism Continue home dose Synthroid .   History of stroke Continue Plavix , Lipitor  He is on eliquis  at home.   History of seizure PRN P.o. Ativan  for seizure Continue home Depakote  250 mg daily. Seizure precautions.   Paroxysmal atrial fibrillation [facility]): Currently heart rate 70-80s Continue Metoprolol , heparin  drip. Continue Eliquis    Hx of pulmonary embolus Continue Eliquis .   Physical Exam: General - Elderly Caucasian male, distress due to cough. HEENT - PERRLA, EOMI, atraumatic head, non tender sinuses. Lung - Clear, basal rales, diffuse rhonchi, no wheezes. Heart - S1, S2 heard, no murmurs, rubs, trace pedal edema. Abdomen - Soft, non tender, bowel sounds good Neuro - Alert, awake and oriented, non focal exam. Skin - Warm and dry.       Data Reviewed:     Latest Ref Rng & Units 05/22/2024    5:46 AM 05/21/2024    3:17 AM 05/20/2024    2:30 AM  CBC  WBC 4.0 - 10.5 K/uL 6.9  6.6  8.9   Hemoglobin 13.0 - 17.0 g/dL 9.8  9.6  8.8   Hematocrit 39.0 - 52.0 % 32.1  30.3  28.2   Platelets 150 - 400 K/uL 220  205  213        Latest Ref Rng & Units 05/22/2024    5:46 AM 05/21/2024    3:17 AM 05/20/2024    2:30 AM  BMP  Glucose 70 - 99 mg/dL 895  99  886   BUN 8 - 23 mg/dL  29  31  30    Creatinine 0.61 - 1.24 mg/dL 8.14  8.06  8.15   Sodium 135 - 145 mmol/L 135  137  138   Potassium 3.5 - 5.1 mmol/L 4.0  3.7  4.1   Chloride 98 - 111 mmol/L 96  96  100   CO2 22 - 32 mmol/L 27  29  24    Calcium  8.9 - 10.3 mg/dL 8.4  8.3  8.5     Vitals:   05/22/24 0330 05/22/24 0738 05/22/24 1132 05/22/24 1604  BP: (!) 112/57 (!) 123/58 113/60 (!) 107/53  Pulse: 68 72 71 71  Resp: 16 18 (!) 21 17  Temp: 98.3 F (36.8 C) 98.2 F (36.8 C) 97.6 F  (36.4 C) 98 F (36.7 C)  TempSrc:  Oral  Oral  SpO2: 93% 97% 96% 95%  Weight:      Height:         Author: Drue ONEIDA Potter, MD 05/22/2024 4:27 PM  For on call review [email or web address].
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
## 2024-05-22 NOTE — Progress Notes (Signed)
 Mobility Specialist - Progress Note    05/22/24 1600  Mobility  Activity Ambulated with assistance;Stood at bedside;Dangled on edge of bed  Level of Assistance Contact guard assist, steadying assist  Assistive Device Front wheel walker  Distance Ambulated (ft) 20 ft  Range of Motion/Exercises All extremities;Active Assistive  Activity Response Tolerated well  Mobility visit 1 Mobility  Mobility Specialist Start Time (ACUTE ONLY) 1451  Mobility Specialist Stop Time (ACUTE ONLY) 1520  Mobility Specialist Time Calculation (min) (ACUTE ONLY) 29 min   Pt was supine in bed with the HOB elevated on RA. Pt agreed to mobility today. Pt did state that his right heel has pain at times. Pt is able today to get to the EOB independently with bed features. Pt is able today to STS with minA CGA. Pt ambulated well today. Pt did not need a recovery break throughout activity. After activity pt returned to the room back in bed with needs in reach and bed alarm on.  Clem Rodes Mobility Specialist 05/22/2024, 4:09 PM

## 2024-05-22 NOTE — Progress Notes (Signed)
 Patient transferred to room 210 by this RN. Patient belongings transported with patient to include 2 hearing aids w/ charger, one cell phone with charger, 2 bags with clothing, and pair of shoes.

## 2024-05-22 NOTE — Evaluation (Signed)
 Occupational Therapy Evaluation Patient Details Name: Thomas Moore MRN: [id number] DOB: December 20, 1955 Today's Date: 05/22/2024   History of Present Illness   Thomas Moore is a 68 y.o. male  with a past medical history of coronary artery disease s/p CABG (1997), chronic HFrEF (30-35% - 08/2023), paroxysmal atrial fibrillation (on Eliquis ), hypertension, hyperlipidemia, diabetes, history stroke, history of SVT, CKD stage IIIa, aortic stenosis s/p TAVR, history of thrombocytopenia, bilateral carotid artery stenosis, s/p loop recorder implant who was here 05/04/2024 for left arm pain similar to prior MIs.  Now returns withshortness of breath and chest pain.     Clinical Impressions Chart reviewed to date, pt greeted semi supine in bed, alert and oriented x4. PTA Pt reports he is generally MOD I with ADL, has had increased difficulties however in the last few weeks such as LB dressing/bathing. He uses a bus system through his insurance and his ex-wife helps with IADLs as needed. He reports he cannot remember when to fill his pills/take his medicine. He reports he amb with PRN use of AD, no falls reported. Pt presents with deficits in strength, endurance, activity tolerance, balance, cognition, affecting safe and optimal ADL completion. Please see further details below. Pt is performing ADL/functional mobility below PLOF, will benefit from acute OT to address functional deficits. Pt is left in bedside chair, all needs met. OT will follow.      If plan is discharge home, recommend the following:   A little help with walking and/or transfers;A little help with bathing/dressing/bathroom;Direct supervision/assist for medications management     Functional Status Assessment   Patient has had a recent decline in their functional status and demonstrates the ability to make significant improvements in function in a reasonable and predictable amount of time.     Equipment Recommendations   None  recommended by OT     Recommendations for Other Services         Precautions/Restrictions   Precautions Precautions: Fall Recall of Precautions/Restrictions: Intact Restrictions Weight Bearing Restrictions Per Provider Order: No     Mobility Bed Mobility Overal bed mobility: Needs Assistance Bed Mobility: Supine to Sit, Sit to Supine     Supine to sit: Contact guard Sit to supine: Contact guard assist        Transfers Overall transfer level: Needs assistance Equipment used: Rolling walker (2 wheels) Transfers: Sit to/from Stand Sit to Stand: Min assist                  Balance Overall balance assessment: Needs assistance Sitting-balance support: No upper extremity supported, Feet supported Sitting balance-Leahy Scale: Good     Standing balance support: Bilateral upper extremity supported, During functional activity, Reliant on assistive device for balance Standing balance-Leahy Scale: Fair                             ADL either performed or assessed with clinical judgement   ADL Overall ADL's : Needs assistance/impaired Eating/Feeding: Modified independent   Grooming: Supervision/safety;Standing               Lower Body Dressing: Maximal assistance Lower Body Dressing Details (indicate cue type and reason): donn socks and slip on shoes, pt reports this is more difficult recently Toilet Transfer: Contact guard assist;Minimal assistance;Rolling walker (2 wheels);Ambulation Toilet Transfer Details (indicate cue type and reason): MIN A, progress to CGA, simulated         Functional mobility during ADLs: Contact  guard assist;Minimal assistance (approx 20' with RW, MINA, progress to [facility])       Vision Patient Visual Report: No change from baseline       Perception         Praxis         Pertinent Vitals/Pain Pain Assessment Pain Assessment: No/denies pain     Extremity/Trunk Assessment Upper Extremity  Assessment Upper Extremity Assessment: Generalized weakness   Lower Extremity Assessment Lower Extremity Assessment: Generalized weakness       Communication Communication Communication: Impaired Factors Affecting Communication: Hearing impaired   Cognition Arousal: Alert Behavior During Therapy: Anxious Cognition: Cognition impaired       Memory impairment (select all impairments): Short-term memory   Executive functioning impairment (select all impairments): Problem solving OT - Cognition Comments: will continue to assess                 Following commands: Intact       Cueing  General Comments   Cueing Techniques: Verbal cues  vss   Exercises Other Exercises Other Exercises: edu re role of OT, role of rehab   Shoulder Instructions      Home Living Family/patient expects to be discharged to:: Skilled nursing facility Living Arrangements: Alone Available Help at Discharge: Other (Comment) (ex-wife will help him as needed) Type of Home: Apartment Home Access: Level entry     Home Layout: One level     Bathroom Shower/Tub: Chief Strategy Officer: Standard Bathroom Accessibility: Yes How Accessible: Accessible via walker Home Equipment: Rolling Walker (2 wheels);Rollator (4 wheels);Grab bars - toilet;Grab bars - tub/shower          Prior Functioning/Environment Prior Level of Function : Independent/Modified Independent             Mobility Comments: amb with RW/rollator ADLs Comments: reports generally MOD I with ADL however increased difficulties, also increased difficulty with IADLs such as med management    OT Problem List: Decreased strength;Decreased activity tolerance;Impaired balance (sitting and/or standing);Decreased knowledge of use of DME or AE;Decreased safety awareness   OT Treatment/Interventions: Self-care/ADL training;Therapeutic exercise;DME and/or AE instruction;Energy conservation;Patient/family  education;Visual/perceptual remediation/compensation;Therapeutic activities;Balance training      OT Goals(Current goals can be found in the care plan section)   Acute Rehab OT Goals Patient Stated Goal: improve function OT Goal Formulation: With patient Time For Goal Achievement: 06/05/24 Potential to Achieve Goals: Good ADL Goals Pt Will Perform Grooming: with modified independence;standing;sitting Pt Will Perform Lower Body Dressing: with modified independence;sitting/lateral leans;sit to/from stand Pt Will Transfer to Toilet: with modified independence;ambulating Pt Will Perform Toileting - Clothing Manipulation and hygiene: with modified independence;sitting/lateral leans;sit to/from stand Additional ADL Goal #1: Pt will participate in further cognitive assessment (i.e. SLUMS/pillbox text) to facilitate optimal IADL enagement   OT Frequency:  Min 2X/week    Co-evaluation              AM-PAC OT 6 Clicks Daily Activity     Outcome Measure Help from another person eating meals?: None Help from another person taking care of personal grooming?: None Help from another person toileting, which includes using toliet, bedpan, or urinal?: None Help from another person bathing (including washing, rinsing, drying)?: A Little Help from another person to put on and taking off regular upper body clothing?: None Help from another person to put on and taking off regular lower body clothing?: A Lot 6 Click Score: 21   End of Session Equipment Utilized During Treatment: Rolling walker (  2 wheels) Nurse Communication: Mobility status  Activity Tolerance: Patient tolerated treatment well Patient left: in chair;with call bell/phone within reach;with chair alarm set  OT Visit Diagnosis: Other abnormalities of gait and mobility (R26.89);Muscle weakness (generalized) (M62.81)                Time: 9044-8984 OT Time Calculation (min): 20 min Charges:  OT General Charges $OT Visit: 1  Visit OT Evaluation $OT Eval Low Complexity: 1 Low Therisa Sheffield, OTD OTR/L  05/22/2024, 1:18 PM

## 2024-05-22 NOTE — Care Management Important Message (Signed)
 Important Message  Patient Details  Name: Thomas Moore MRN: [id number] Date of Birth: 1955/11/19   Important Message Given:  Yes - Medicare IM     [name] 05/22/2024, 1:53 PM

## 2024-05-23 ENCOUNTER — Ambulatory Visit: Payer: Self-pay | Admitting: Cardiology

## 2024-05-23 DIAGNOSIS — I5043 Acute on chronic combined systolic (congestive) and diastolic (congestive) heart failure: Secondary | ICD-10-CM | POA: Diagnosis not present

## 2024-05-23 LAB — GLUCOSE, CAPILLARY
Glucose-Capillary: 97 mg/dL (ref 70–99)
Glucose-Capillary: 172 mg/dL — ABNORMAL HIGH (ref 70–99)
Glucose-Capillary: 104 mg/dL — ABNORMAL HIGH (ref 70–99)
Glucose-Capillary: 162 mg/dL — ABNORMAL HIGH (ref 70–99)

## 2024-05-23 NOTE — Plan of Care (Signed)

## 2024-05-23 NOTE — Progress Notes (Signed)
 Mobility Specialist - Progress Note   05/23/24 1204  Mobility  Activity Ambulated with assistance  Level of Assistance Contact guard assist, steadying assist  Assistive Device Front wheel walker  Distance Ambulated (ft) 160 ft  Activity Response Tolerated well  Mobility visit 1 Mobility  Mobility Specialist Start Time (ACUTE ONLY) 1141  Mobility Specialist Stop Time (ACUTE ONLY) 1202  Mobility Specialist Time Calculation (min) (ACUTE ONLY) 21 min   Pt semi fowler upon entry, utilizing RA. Pt expressed mobility concerns, stating I'm not sure if I can walk and I can't live by myself, I'm unable to do much things. Pt completed bed mob ModI, requiring CGA-MinG to stand and amb one lap around the NS--- expressed feeling weak, no LOB noted. Pt returned to the room, left in fowler position with alarm set and needs within reach.  America Silvan Mobility Specialist 05/23/2024 12:08 PM

## 2024-05-23 NOTE — Plan of Care (Signed)
   Problem: Education: Goal: Knowledge of General Education information will improve Description Including pain rating scale, medication(s)/side effects and non-pharmacologic comfort measures Outcome: Progressing

## 2024-05-23 NOTE — Progress Notes (Signed)
 Physical Therapy Treatment Patient Details Name: Thomas Moore MRN: [id number] DOB: 03/09/1956 Today's Date: 05/23/2024   History of Present Illness Thomas Moore is a 68 y.o. male  with a past medical history of coronary artery disease s/p CABG (1997), chronic HFrEF (30-35% - 08/2023), paroxysmal atrial fibrillation (on Eliquis ), hypertension, hyperlipidemia, diabetes, history stroke, history of SVT, CKD stage IIIa, aortic stenosis s/p TAVR, history of thrombocytopenia, bilateral carotid artery stenosis, s/p loop recorder implant who was here 05/04/2024 for left arm pain similar to prior MIs.  Now returns withshortness of breath and chest pain.    PT Comments  Patient is agreeable to PT session. He reports feeling generalized weakness. Challenged patient without assistive device while standing and walking. He required Min A for standing and ambulation. Recommend to use rolling walker for safety at this time. He does not appear to be at his baseline level of functional independence. Recommend to continue PT to maximize independence and facilitate return to prior level of function.    If plan is discharge home, recommend the following: A little help with walking and/or transfers;A little help with bathing/dressing/bathroom;Assistance with cooking/housework;Assist for transportation   Can travel by private vehicle     Yes  Equipment Recommendations  None recommended by PT    Recommendations for Other Services       Precautions / Restrictions Precautions Precautions: Fall Recall of Precautions/Restrictions: Intact Restrictions Weight Bearing Restrictions Per Provider Order: No     Mobility  Bed Mobility Overal bed mobility: Needs Assistance Bed Mobility: Supine to Sit, Sit to Supine     Supine to sit: Contact guard Sit to supine: Contact guard assist   General bed mobility comments: increased time required to complete tasks    Transfers Overall transfer level: Needs  assistance Equipment used: 1 person hand held assist Transfers: Sit to/from Stand Sit to Stand: Min assist           General transfer comment: steadying assistance provided with standing    Ambulation/Gait Ambulation/Gait assistance: Min assist Gait Distance (Feet): 60 Feet Assistive device: None Gait Pattern/deviations: Step-through pattern, Decreased stride length Gait velocity: decreased     General Gait Details: challenged patient with walking without assistive device. narrow base of support with cues for safety. patient also reaching out for furniture and wall railing for support. recommend continued use of rolling walker at this time for safety   Stairs             Wheelchair Mobility     Tilt Bed    Modified Rankin (Stroke Patients Only)       Balance Overall balance assessment: Needs assistance Sitting-balance support: No upper extremity supported, Feet supported Sitting balance-Leahy Scale: Good     Standing balance support: No upper extremity supported Standing balance-Leahy Scale: Poor Standing balance comment: unsteady with dynamic activity without assistive device                            Communication Communication Communication: No apparent difficulties  Cognition Arousal: Alert Behavior During Therapy: WFL for tasks assessed/performed   PT - Cognitive impairments: No apparent impairments                         Following commands: Intact      Cueing Cueing Techniques: Verbal cues  Exercises      General Comments        Pertinent Vitals/Pain Pain  Assessment Pain Assessment: No/denies pain    Home Living                          Prior Function            PT Goals (current goals can now be found in the care plan section) Acute Rehab PT Goals Patient Stated Goal: get stronger, go to rehab PT Goal Formulation: With patient Time For Goal Achievement: 06/03/24 Potential to Achieve Goals:  Good Progress towards PT goals: Progressing toward goals    Frequency    Min 2X/week      PT Plan      Co-evaluation              AM-PAC PT 6 Clicks Mobility   Outcome Measure  Help needed turning from your back to your side while in a flat bed without using bedrails?: None Help needed moving from lying on your back to sitting on the side of a flat bed without using bedrails?: A Little Help needed moving to and from a bed to a chair (including a wheelchair)?: A Little Help needed standing up from a chair using your arms (e.g., wheelchair or bedside chair)?: A Little Help needed to walk in hospital room?: A Little Help needed climbing 3-5 steps with a railing? : A Lot 6 Click Score: 18    End of Session   Activity Tolerance: Patient tolerated treatment well Patient left: in bed;with call bell/phone within reach;with bed alarm set Nurse Communication: Mobility status PT Visit Diagnosis: Other abnormalities of gait and mobility (R26.89);Muscle weakness (generalized) (M62.81);Difficulty in walking, not elsewhere classified (R26.2)     Time: 8680-8663 PT Time Calculation (min) (ACUTE ONLY): 17 min  Charges:    $Gait Training: 8-22 mins PT General Charges $$ ACUTE PT VISIT: 1 Visit                     Randine Essex, PT, MPT    Randine LULLA Essex 05/23/2024, 1:46 PM

## 2024-05-23 NOTE — Plan of Care (Signed)
" °  Problem: Education: Goal: Ability to describe self-care measures that may prevent or decrease complications (Diabetes Survival Skills Education) will improve Outcome: Progressing Goal: Individualized Educational Video(s) Outcome: Progressing   Problem: Coping: Goal: Ability to adjust to condition or change in health will improve Outcome: Progressing   Problem: Fluid Volume: Goal: Ability to maintain a balanced intake and output will improve Outcome: Progressing   Problem: Health Behavior/Discharge Planning: Goal: Ability to identify and utilize available resources and services will improve Outcome: Progressing Goal: Ability to manage health-related needs will improve Outcome: Progressing   Problem: Metabolic: Goal: Ability to maintain appropriate glucose levels will improve Outcome: Progressing   Problem: Nutritional: Goal: Maintenance of adequate nutrition will improve Outcome: Progressing Goal: Progress toward achieving an optimal weight will improve Outcome: Progressing   Problem: Skin Integrity: Goal: Risk for impaired skin integrity will decrease Outcome: Progressing   Problem: Tissue Perfusion: Goal: Adequacy of tissue perfusion will improve Outcome: Progressing   Problem: Education: Goal: Knowledge of General Education information will improve Description: Including pain rating scale, medication(s)/side effects and non-pharmacologic comfort measures Outcome: Progressing   Problem: Health Behavior/Discharge Planning: Goal: Ability to manage health-related needs will improve Outcome: Progressing   Problem: Clinical Measurements: Goal: Ability to maintain clinical measurements within normal limits will improve Outcome: Progressing   Problem: Activity: Goal: Risk for activity intolerance will decrease Outcome: Progressing   Problem: Nutrition: Goal: Adequate nutrition will be maintained Outcome: Progressing   Problem: Elimination: Goal: Will not  experience complications related to bowel motility Outcome: Progressing Goal: Will not experience complications related to urinary retention Outcome: Progressing   Problem: Pain Managment: Goal: General experience of comfort will improve and/or be controlled Outcome: Progressing   Problem: Safety: Goal: Ability to remain free from injury will improve Outcome: Progressing   Problem: Skin Integrity: Goal: Risk for impaired skin integrity will decrease Outcome: Progressing   Problem: Activity: Goal: Capacity to carry out activities will improve Outcome: Progressing   "

## 2024-05-23 NOTE — TOC Progression Note (Signed)
 Transition of Care [facility]) - Progression Note    Patient Details  Name: Thomas Moore MRN: [id number] Date of Birth: [birth year]  Transition of [facility]) CM/SW Contact  [name], RN Phone Number: 05/23/2024, 11:58 AM  Clinical Narrative:      Noted to call ex wife [name] with bed offers prior presenting patient.  VM left for [facility]                   Expected Discharge Plan and Services                                               Social Drivers of Health (SDOH) Interventions SDOH Screenings   Food Insecurity: No Food Insecurity (05/22/2024)  Housing: Low Risk (05/22/2024)  Transportation Needs: No Transportation Needs (05/22/2024)  Utilities: Not At Risk (05/22/2024)  Financial Resource Strain: Low Risk  (03/06/2024)   Received from [facility]  Social Connections: Moderately Integrated (05/22/2024)  Tobacco Use: Medium Risk (05/19/2024)    Readmission Risk Interventions    05/22/2024    1:47 PM 05/06/2024   11:17 AM  Readmission Risk Prevention Plan  Transportation Screening Complete Complete  PCP or Specialist Appt within 3-5 Days  Complete  Social Work Consult for Recovery Care Planning/Counseling  Complete  Medication Review Oceanographer) Complete Complete  PCP or Specialist appointment within 3-5 days of discharge Complete   HRI or Home Care Consult Complete   SW Recovery Care/Counseling Consult Complete   Palliative Care Screening Not Applicable   Skilled Nursing Facility Complete

## 2024-05-23 NOTE — Progress Notes (Signed)
 " Progress Note   Patient: Thomas Moore FMW:[id number] DOB: [birth year] DOA: 05/19/2024     4 DOS: the patient was seen and examined on 05/23/2024     Brief hospital course: From HPI DIONTRE HARPS is a 68 y.o. male with medical history significant of sCHF with EF 30-35%, stroke,  A fib and PE on Eliquis , CAD, s/p of CABG 1997, HTN, HLD, DM, stroke, CKD-3a, SVT, thrombocytopenia, aortic stenosis (s/p of TAVR), bilateral carotid artery stenosis, seizure, chronic back pain, kidney stone (s/p of ESWL), loop recorder, thrombocytopenia, recent non-STEMI, who presents with SOB and chest pain. BNP 27740, troponin 159-->186. He is admitted for CHF exacerbation, NSTEMI. Started  on heparin  drip, IV lasix  therapy, cardiology consulted.     Assessment and Plan: Acute on chronic combined systolic and diastolic congestive heart failure (HCC):  Echo on 05/05/2024 showed EF 35-40%.  X-ray showed some consolidation. BNP [id number]. Clinically fluid overloaded. Transition to p.o. torsemide  Cardiology input appreciated. Continue home dose metoprolol , imdur . Continue daily weights, strict input and output. PT OT recommending SNF Currently pending placement, TOC working on   NSTEMI (non-ST elevated myocardial infarction) (HCC) and CAD (coronary artery disease): Troponin 158  --> 186.  S/p of CABG 1997. Patient recently underwent LHC 12/1 with diffuse disease of SVG to OM.  Cardiologist recommended medical management. Chest pain resolved. Continue Eliquis  Continue, plavix , Lipitor , metoprolol , Imdur .   Essential hypertension IV hydralazine  as needed Continue Imdur , metoprolol    HLD (hyperlipidemia) Continue Lipitor    Type II diabetes mellitus with renal manifestations (HCC): Recent A1c 6.2, well-controlled.  Patient is taking Trulicity , Jardiance , Tresiba  10 units daily Continue accucheks, sliding scale, lantus  8 units daily   Chronic kidney disease, stage 3b (HCC): Renal function close to baseline.   Baseline around 2. Continue to monitor as he is on IV diuresis.   Normocytic anemia: Hemoglobin stable (9.7 on 05/07/2024). Follow-up with daily CBC   COPD (chronic obstructive pulmonary disease) (HCC): No wheezing Continue Bronchodilators.  Mucinex    Hypothyroidism Continue home dose Synthroid .   History of stroke Continue Plavix , Lipitor  He is on eliquis  at home.   History of seizure PRN P.o. Ativan  for seizure Continue home Depakote  250 mg daily. Seizure precautions.   Paroxysmal atrial fibrillation [facility]): Currently heart rate 70-80s Continue Metoprolol , heparin  drip. Continue Eliquis    Hx of pulmonary embolus Continue Eliquis .   Physical Exam: General - Elderly Caucasian male, distress due to cough. HEENT - PERRLA, EOMI, atraumatic head, non tender sinuses. Lung - Clear, basal rales, diffuse rhonchi, no wheezes. Heart - S1, S2 heard, no murmurs, rubs, trace pedal edema. Abdomen - Soft, non tender, bowel sounds good Neuro - Alert, awake and oriented, non focal exam. Skin - Warm and dry.   Subjective: Patient seen and examined at bedside this morning No acute overnight event currently awaiting placement   Data Reviewed:   Vitals:   05/22/24 1943 05/23/24 0239 05/23/24 0500 05/23/24 0832  BP: (!) 115/58 112/63  122/63  Pulse: 72 66  69  Resp: 18 18  16   Temp: 98.5 F (36.9 C) 97.9 F (36.6 C)  97.7 F (36.5 C)  TempSrc:      SpO2: 100% 98%  97%  Weight:   61.8 kg   Height:          Latest Ref Rng & Units 05/22/2024    5:46 AM 05/21/2024    3:17 AM 05/20/2024    2:30 AM  BMP  Glucose 70 - 99 mg/dL 895  99  113   BUN 8 - 23 mg/dL 29  31  30    Creatinine 0.61 - 1.24 mg/dL 8.14  8.06  8.15   Sodium 135 - 145 mmol/L 135  137  138   Potassium 3.5 - 5.1 mmol/L 4.0  3.7  4.1   Chloride 98 - 111 mmol/L 96  96  100   CO2 22 - 32 mmol/L 27  29  24    Calcium  8.9 - 10.3 mg/dL 8.4  8.3  8.5      Author: Drue ONEIDA Potter, MD 05/23/2024 4:20 PM  For on call  review [email or web address].  "

## 2024-05-23 NOTE — TOC Progression Note (Addendum)
 Transition of Care [facility]) - Progression Note    Patient Details  Name: Thomas Moore MRN: [id number] Date of Birth: May 24, 1956  [facility] CM/SW Contact  [name], RN Phone Number: 05/23/2024, 2:22 PM  Clinical Narrative:     Met with patient at bedside Ex wife [name] was on speaker phone Presented SNF bed offers.  CMS Medicare.gov Compare Post Acute Care list reviewed with patient and [name] Patient accepted [facility].  [name] in agreement Accepted in HUB and notified [name] at [facility] they are accepting admissions over the weekend  Message sent to MD to determine if patient medically appropriate for auth to be initiates Per ex wife [name] she will be available to transport patient at discharge    Update.  Per MD patient medically ready for auth to be initiated. Requested [name] from [facility] to start auth                 Expected Discharge Plan and Services                                               Social Drivers of Health (SDOH) Interventions SDOH Screenings   Food Insecurity: No Food Insecurity (05/22/2024)  Housing: Low Risk (05/22/2024)  Transportation Needs: No Transportation Needs (05/22/2024)  Utilities: Not At Risk (05/22/2024)  Financial Resource Strain: Low Risk  (03/06/2024)   Received from [facility]  Social Connections: Moderately Integrated (05/22/2024)  Tobacco Use: Medium Risk (05/19/2024)    Readmission Risk Interventions    05/22/2024    1:47 PM 05/06/2024   11:17 AM  Readmission Risk Prevention Plan  Transportation Screening Complete Complete  PCP or Specialist Appt within 3-5 Days  Complete  Social Work Consult for Recovery Care Planning/Counseling  Complete  Medication Review Oceanographer) Complete Complete  PCP or Specialist appointment within 3-5 days of discharge Complete   HRI or Home Care Consult Complete   SW Recovery Care/Counseling Consult Complete    Palliative Care Screening Not Applicable   Skilled Nursing Facility Complete
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
## 2024-05-24 DIAGNOSIS — I5043 Acute on chronic combined systolic (congestive) and diastolic (congestive) heart failure: Secondary | ICD-10-CM | POA: Diagnosis not present

## 2024-05-24 LAB — GLUCOSE, CAPILLARY
Glucose-Capillary: 128 mg/dL — ABNORMAL HIGH (ref 70–99)
Glucose-Capillary: 152 mg/dL — ABNORMAL HIGH (ref 70–99)

## 2024-05-24 MED ORDER — SPIRONOLACTONE 25 MG PO TABS: 25.0000 mg | ORAL_TABLET | Freq: Every day | ORAL | Status: DC

## 2024-05-24 MED ORDER — METOPROLOL SUCCINATE ER 25 MG PO TB24: 12.5000 mg | ORAL_TABLET | Freq: Every day | ORAL | Status: DC

## 2024-05-24 NOTE — Discharge Summary (Signed)
 " Physician Discharge Summary   Patient: Thomas Moore MRN: [id number] DOB: [birth year]  Admit date:     05/19/2024  Discharge date: 05/24/2024  Discharge Physician: Drue ONEIDA Potter   PCP: Lenon Layman ORN, MD   Recommendations at discharge:  Follow-up with cardiology  Discharge Diagnoses: Principal Problem:   Acute on chronic combined systolic and diastolic congestive heart failure (HCC) Active Problems:   NSTEMI (non-ST elevated myocardial infarction) (HCC)   CAD (coronary artery disease)   Essential hypertension   HLD (hyperlipidemia)   Type II diabetes mellitus with renal manifestations (HCC)   Chronic kidney disease, stage 3b (HCC)   Normocytic anemia   COPD (chronic obstructive pulmonary disease) (HCC)   Hypothyroidism   History of stroke   History of seizure   Paroxysmal atrial fibrillation (HCC)   Hx of pulmonary embolus  Resolved Problems:   * No resolved hospital problems. [facility] Course:  From HPI Thomas Moore is a 68 y.o. male with medical history significant of sCHF with EF 30-35%, stroke,  A fib and PE on Eliquis , CAD, s/p of CABG 1997, HTN, HLD, DM, stroke, CKD-3a, SVT, thrombocytopenia, aortic stenosis (s/p of TAVR), bilateral carotid artery stenosis, seizure, chronic back pain, kidney stone (s/p of ESWL), loop recorder, thrombocytopenia, recent non-STEMI, who presents with SOB and chest pain. BNP 27740, troponin 159-->186. He is admitted for CHF exacerbation, NSTEMI. Started  on heparin  drip, IV lasix  therapy, cardiology consulted.     Other hospital course as noted below:  Assessment and Plan: Acute on chronic combined systolic and diastolic congestive heart failure (HCC):  Echo on 05/05/2024 showed EF 35-40%.   BNP [id number].  Currently euvolemic Continue torsemide  Cardiology input appreciated. Continue home dose metoprolol , imdur . Continue daily weights, strict input and output. PT OT recommending SNF Currently pending placement, TOC working on    NSTEMI (non-ST elevated myocardial infarction) (HCC) and CAD (coronary artery disease): Troponin 158  --> 186.  S/p of CABG 1997. Patient recently underwent LHC 12/1 with diffuse disease of SVG to OM.  Cardiologist recommended medical management. Chest pain resolved. Continue Eliquis  Continue, plavix , Lipitor , metoprolol , Imdur .   Essential hypertension IV hydralazine  as needed Continue Imdur , metoprolol    HLD (hyperlipidemia) Continue Lipitor    Type II diabetes mellitus with renal manifestations (HCC): Recent A1c 6.2, well-controlled.  Patient is taking Trulicity , Jardiance , Tresiba  10 units daily Continue accucheks, sliding scale, lantus  8 units daily   Chronic kidney disease, stage 3b [facility]):  Outpatient follow-up and monitoring   Normocytic anemia: Hemoglobin stable (9.7 on 05/07/2024). No indication for blood transfusion   COPD (chronic obstructive pulmonary disease) (HCC): No wheezing Continue Bronchodilators.  Mucinex    Hypothyroidism Continue home dose Synthroid .   History of stroke Continue Plavix , Lipitor  He is on eliquis  at home.   History of seizure PRN P.o. Ativan  for seizure Continue home Depakote  250 mg daily. Seizure precautions.   Paroxysmal atrial fibrillation [facility]): Currently heart rate 70-80s Continue Metoprolol , heparin  drip. Continue Eliquis    Hx of pulmonary embolus Continue Eliquis .  Consultants: Cardiology Procedures performed: None Disposition: Skilled nursing facility Diet recommendation:  Cardiac and Carb modified diet DISCHARGE MEDICATION: Allergies as of 05/24/2024   No Known Allergies      Medication List     STOP taking these medications    sacubitril -valsartan  24-26 MG Commonly known as: Entresto        TAKE these medications    acetaminophen  650 MG CR tablet Commonly known as: TYLENOL  Take 1,300 mg by mouth  every 8 (eight) hours as needed for pain.   amiodarone  100 MG tablet Commonly known as: Pacerone  Take 2  tablets (200 mg total) by mouth daily.   apixaban  5 MG Tabs tablet Commonly known as: ELIQUIS  Take 1 tablet (5 mg total) by mouth 2 (two) times daily.   atorvastatin  80 MG tablet Commonly known as: LIPITOR  Take 1 tablet (80 mg total) by mouth at bedtime.   CALCIUM  + D PO Take 1 tablet by mouth 2 (two) times daily.   clopidogrel  75 MG tablet Commonly known as: PLAVIX  Take 1 tablet by mouth once daily   cyanocobalamin  1000 MCG tablet Commonly known as: VITAMIN B12 Take 1 tablet (1,000 mcg total) by mouth daily.   cyclopentolate 1 % ophthalmic solution Commonly known as: CYCLODRYL,CYCLOGYL Place 1 drop into the left eye 2 (two) times daily.   diclofenac Sodium 1 % Gel Commonly known as: VOLTAREN Apply 4 g topically 4 (four) times daily.   divalproex  250 MG DR tablet Commonly known as: DEPAKOTE  Take 250 mg by mouth at bedtime.   erythromycin ophthalmic ointment Place 1 Application into both eyes 3 (three) times daily.   insulin  degludec 100 UNIT/ML FlexTouch Pen Commonly known as: TRESIBA  Inject 10 Units into the skin daily.   isosorbide  mononitrate 30 MG 24 hr tablet Commonly known as: IMDUR  Take 0.5 tablets (15 mg total) by mouth daily.   Jardiance  25 MG Tabs tablet Generic drug: empagliflozin  Take 25 mg by mouth daily.   levothyroxine  50 MCG tablet Commonly known as: SYNTHROID  Take 50 mcg by mouth daily before breakfast.   metoprolol  succinate 25 MG 24 hr tablet Commonly known as: Toprol  XL Take 0.5 tablets (12.5 mg total) by mouth daily. Start taking on: May 25, 2024   moxifloxacin 0.5 % ophthalmic solution Commonly known as: VIGAMOX Place 1 drop into the left eye 3 (three) times daily.   multivitamin capsule Take 1 capsule by mouth daily.   mupirocin ointment 2 % Commonly known as: BACTROBAN Apply 1 Application topically 2 (two) times daily.   nitroGLYCERIN  0.4 MG SL tablet Commonly known as: NITROSTAT  Place 1 tablet (0.4 mg total) under the  tongue every 5 (five) minutes as needed for chest pain.   ofloxacin  0.3 % ophthalmic solution Commonly known as: OCUFLOX  Place 1 drop into the left eye 4 (four) times daily.   Oxervate  0.002 % Soln Generic drug: Cenegermin -bkbj Place 1 drop into both eyes 6 (six) times daily.   prednisoLONE  acetate 1 % ophthalmic suspension Commonly known as: PRED FORTE  Place 1 drop into the left eye 4 (four) times daily.   spironolactone  25 MG tablet Commonly known as: ALDACTONE  Take 1 tablet (25 mg total) by mouth daily. Start taking on: May 25, 2024   torsemide  20 MG tablet Commonly known as: DEMADEX  Take 1 tablet (20 mg total) by mouth daily.   Trulicity  4.5 MG/0.5ML Soaj Generic drug: Dulaglutide  Inject 4.5 mg into the skin once a week.        Contact information for follow-up providers     Fernand Denyse LABOR, MD. Go in 1 week(s).   Specialty: Cardiology Contact information: [address] [place] [address] [phone]              Contact information for after-discharge care     Destination     [facility] .   Service: Skilled Nursing Contact information: [address] [place]  [id number] [phone]  Discharge Exam: Filed Weights   05/20/24 0651 05/23/24 0500 05/24/24 0500  Weight: 65.3 kg 61.8 kg 59.5 kg   General - Elderly Caucasian male, distress due to cough. HEENT - PERRLA, EOMI, atraumatic head, non tender sinuses. Lung - Clear, basal rales, diffuse rhonchi, no wheezes. Heart - S1, S2 heard, no murmurs, rubs, trace pedal edema. Abdomen - Soft, non tender, bowel sounds good Neuro - Alert, awake and oriented, non focal exam. Skin - Warm and dry.  Condition at discharge: good  The results of significant diagnostics from this hospitalization (including imaging, microbiology, ancillary and laboratory) are listed below for reference.   Imaging Studies: CUP PACEART REMOTE DEVICE  CHECK Result Date: 05/22/2024 ILR summary report received. Battery status OK. Normal device function. No new symptom, tachy, brady, or pause episodes. No new AF episodes. Monthly summary reports and ROV/PRN LA, CVRS  DG Chest 2 View Result Date: 05/19/2024 EXAM: 2 VIEW(S) XRAY OF THE CHEST 05/19/2024 04:11:44 PM COMPARISON: 05/04/2024 CLINICAL HISTORY: Chest pain and left arm pain. FINDINGS: LINES, TUBES AND DEVICES: A loop recorder is present. LUNGS AND PLEURA: Pulmonary vascular congestion similar to the prior study. Small but increasing bilateral pleural effusions with basilar atelectasis or consolidation also increasing. Mild perihilar edema is suggested. No pneumothorax. HEART AND MEDIASTINUM: Postoperative changes in the mediastinum. Cardiac enlargement similar to the prior study. BONES AND SOFT TISSUES: Degenerative changes in the spine and shoulders. IMPRESSION: 1. Small but increasing bilateral pleural effusions with basilar atelectasis or consolidation also increasing. 2. Cardiac enlargement with pulmonary vascular congestion, similar to the prior study. 3. Mild perihilar edema is suggested. Electronically signed by: Elsie Gravely MD 05/19/2024 04:31 PM EST RP Workstation: [id number]   ECHOCARDIOGRAM COMPLETE Result Date: 05/05/2024    ECHOCARDIOGRAM REPORT   Patient Name:   Thomas Moore Date of Exam: 05/05/2024 Medical Rec #:  [id number]      Height:       63.0 in Accession #:    [id number]     Weight:       157.4 lb Date of Birth:  February 29, 1956       BSA:          1.747 m Patient Age:    68 years       BP:           99/52 mmHg Patient Gender: M              HR:           76 bpm. Exam Location:  [facility] Procedure: 2D Echo, Cardiac Doppler and Color Doppler (Both Spectral and Color            Flow Doppler were utilized during procedure). Indications:     Chest pain R07.9  History:         Patient has prior history of Echocardiogram examinations, most                  recent 08/06/2023. CHF, CAD; Risk  Factors:Diabetes. S/P TAVR.  Sonographer:     Christopher Furnace Referring Phys:  [id number] CARALYN HUDSON Diagnosing Phys: Cara JONETTA Lovelace MD IMPRESSIONS  1. Left ventricular ejection fraction, by estimation, is 35 to 40%. The left ventricle has moderately decreased function. The left ventricle demonstrates regional wall motion abnormalities (see scoring diagram/findings for description). The left ventricular internal cavity size was mildly dilated. There is mild concentric left ventricular hypertrophy. Left ventricular diastolic parameters are consistent with Grade III diastolic dysfunction (restrictive).  2. Right  ventricular systolic function is mildly reduced. The right ventricular size is mildly enlarged. Mildly increased right ventricular wall thickness.  3. The mitral valve is normal in structure. No evidence of mitral valve regurgitation.  4. The aortic valve is normal in structure. Aortic valve regurgitation is not visualized. Aortic valve sclerosis is present, with no evidence of aortic valve stenosis. FINDINGS  Left Ventricle: Left ventricular ejection fraction, by estimation, is 35 to 40%. The left ventricle has moderately decreased function. The left ventricle demonstrates regional wall motion abnormalities. Strain was performed and the global longitudinal strain is indeterminate. The left ventricular internal cavity size was mildly dilated. There is mild concentric left ventricular hypertrophy. Left ventricular diastolic parameters are consistent with Grade III diastolic dysfunction (restrictive). Right Ventricle: The right ventricular size is mildly enlarged. Mildly increased right ventricular wall thickness. Right ventricular systolic function is mildly reduced. Left Atrium: Left atrial size was normal in size. Right Atrium: Right atrial size was normal in size. Pericardium: There is no evidence of pericardial effusion. Mitral Valve: The mitral valve is normal in structure. No evidence of mitral valve  regurgitation. MV peak gradient, 10.4 mmHg. The mean mitral valve gradient is 3.0 mmHg. Tricuspid Valve: The tricuspid valve is normal in structure. Tricuspid valve regurgitation is trivial. Aortic Valve: The aortic valve is normal in structure. Aortic valve regurgitation is not visualized. Aortic valve sclerosis is present, with no evidence of aortic valve stenosis. Aortic valve mean gradient measures 4.3 mmHg. Aortic valve peak gradient measures 7.3 mmHg. Aortic valve area, by VTI measures 1.88 cm. Pulmonic Valve: The pulmonic valve was normal in structure. Pulmonic valve regurgitation is not visualized. Aorta: The ascending aorta was not well visualized. IAS/Shunts: No atrial level shunt detected by color flow Doppler. Additional Comments: 3D was performed not requiring image post processing on an independent workstation and was indeterminate.  LEFT VENTRICLE PLAX 2D LVIDd:         5.30 cm      Diastology LVIDs:         4.20 cm      LV e' medial:    5.22 cm/s LV PW:         1.30 cm      LV E/e' medial:  24.1 LV IVS:        1.40 cm      LV e' lateral:   3.81 cm/s LVOT diam:     2.00 cm      LV E/e' lateral: 33.1 LV SV:         45 LV SV Index:   26 LVOT Area:     3.14 cm LV IVRT:       85 msec  LV Volumes (MOD) LV vol d, MOD A2C: 145.0 ml LV vol d, MOD A4C: 112.0 ml LV vol s, MOD A2C: 112.0 ml LV vol s, MOD A4C: 71.9 ml LV SV MOD A2C:     33.0 ml LV SV MOD A4C:     112.0 ml LV SV MOD BP:      41.8 ml RIGHT VENTRICLE RV Basal diam:  4.00 cm    PULMONARY VEINS RV Mid diam:    3.20 cm    Systolic Velocity: 66.00 cm/s RV S prime:     6.85 cm/s TAPSE (M-mode): 1.8 cm LEFT ATRIUM             Index        RIGHT ATRIUM           Index  LA diam:        3.80 cm 2.18 cm/m   RA Area:     13.40 cm LA Vol (A2C):   36.8 ml 21.07 ml/m  RA Volume:   31.70 ml  18.15 ml/m LA Vol (A4C):   32.2 ml 18.44 ml/m LA Biplane Vol: 36.7 ml 21.01 ml/m  AORTIC VALVE AV Area (Vmax):    1.62 cm AV Area (Vmean):   1.62 cm AV Area (VTI):      1.88 cm AV Vmax:           134.67 cm/s AV Vmean:          94.467 cm/s AV VTI:            0.237 m AV Peak Grad:      7.3 mmHg AV Mean Grad:      4.3 mmHg LVOT Vmax:         69.50 cm/s LVOT Vmean:        48.700 cm/s LVOT VTI:          0.142 m LVOT/AV VTI ratio: 0.60  AORTA Ao Root diam: 3.10 cm MITRAL VALVE                TRICUSPID VALVE MV Area (PHT): 3.34 cm     TR Peak grad:   11.3 mmHg MV Area VTI:   1.19 cm     TR Vmax:        168.00 cm/s MV Peak grad:  10.4 mmHg MV Mean grad:  3.0 mmHg     SHUNTS MV Vmax:       1.61 m/s     Systemic VTI:  0.14 m MV Vmean:      82.6 cm/s    Systemic Diam: 2.00 cm MV Decel Time: 227 msec MV E velocity: 126.00 cm/s MV A velocity: 62.40 cm/s MV E/A ratio:  2.02 Cara JONETTA Lovelace MD Electronically signed by Cara JONETTA Lovelace MD Signature Date/Time: 05/05/2024/9:26:05 PM    Final    CARDIAC CATHETERIZATION Result Date: 05/05/2024   Mid LAD lesion is 80% stenosed.   Prox Cx lesion is 90% stenosed.   Ost RCA to Prox RCA lesion is 100% stenosed.   Origin lesion is 100% stenosed.   1st Diag lesion is 80% stenosed.   1st Mrg lesion is 80% stenosed.   Prox Graft lesion is 75% stenosed.   Mid Graft lesion is 50% stenosed.   Mid Graft to Dist Graft lesion is 70% stenosed.   Dist Cx lesion is 80% stenosed.   Origin lesion is 50% stenosed.   Dist LAD lesion is 80% stenosed.   Mid LM to Prox LAD lesion is 75% stenosed.   Prox LAD lesion is 50% stenosed. Conclusion Left heart cath with grafts impression right femoral artery Left ventriculogram was not performed because of elevated creatinine and renal insufficiency Coronaries Left main large distal 75 LAD large 75 ostial 50% proximal 100% mid Diagonal 1 large 95% ostial Circumflex 100% ostial RCA 100% ostial Grafts LIMA to mid LAD widely patent SVG to circumflex diffusely diseased 75 to 50% proximal to mid TIMI-3 flow SVG to RCA 100% occluded at origin Intervention deferred Recommend conservative medical therapy Mynx deployed right femoral  artery  DG Chest Portable 1 View Result Date: 05/04/2024 CLINICAL DATA:  Chest pain and left arm pain radiating to the shoulder. EXAM: PORTABLE CHEST 1 VIEW COMPARISON:  03/19/2024 FINDINGS: Artifact overlies the chest. Previous median sternotomy and CABG. Previous TAVR. Loop recorder  in place. Pulmonary venous hypertension with possible minimal interstitial edema. No advanced or alveolar edema. No visible effusion. No lobar consolidation or collapse. IMPRESSION: Previous CABG and TAVR. Pulmonary venous hypertension with possible minimal interstitial edema. Electronically Signed   By: Oneil Officer M.D.   On: 05/04/2024 13:50    Microbiology: Results for orders placed or performed during the hospital encounter of 05/19/24  Resp panel by RT-PCR (RSV, Flu A&B, Covid) Anterior Nasal Swab     Status: None   Collection Time: 05/19/24  6:44 PM   Specimen: Anterior Nasal Swab  Result Value Ref Range Status   SARS Coronavirus 2 by RT PCR NEGATIVE NEGATIVE Final    Comment: (NOTE) SARS-CoV-2 target nucleic acids are NOT DETECTED.  The SARS-CoV-2 RNA is generally detectable in upper respiratory specimens during the acute phase of infection. The lowest concentration of SARS-CoV-2 viral copies this assay can detect is 138 copies/mL. A negative result does not preclude SARS-Cov-2 infection and should not be used as the sole basis for treatment or other patient management decisions. A negative result may occur with  improper specimen collection/handling, submission of specimen other than nasopharyngeal swab, presence of viral mutation(s) within the areas targeted by this assay, and inadequate number of viral copies(<138 copies/mL). A negative result must be combined with clinical observations, patient history, and epidemiological information. The expected result is Negative.  Fact Sheet for Patients:  bloggercourse.com  Fact Sheet for Healthcare Providers:   seriousbroker.it  This test is no t yet approved or cleared by the [place]  FDA and  has been authorized for detection and/or diagnosis of SARS-CoV-2 by FDA under an Emergency Use Authorization (EUA). This EUA will remain  in effect (meaning this test can be used) for the duration of the COVID-19 declaration under Section 564(b)(1) of the Act, 21 U.S.C.section 360bbb-3(b)(1), unless the authorization is terminated  or revoked sooner.       Influenza A by PCR NEGATIVE NEGATIVE Final   Influenza B by PCR NEGATIVE NEGATIVE Final    Comment: (NOTE) The Xpert Xpress SARS-CoV-2/FLU/RSV plus assay is intended as an aid in the diagnosis of influenza from Nasopharyngeal swab specimens and should not be used as a sole basis for treatment. Nasal washings and aspirates are unacceptable for Xpert Xpress SARS-CoV-2/FLU/RSV testing.  Fact Sheet for Patients: bloggercourse.com  Fact Sheet for Healthcare Providers: seriousbroker.it  This test is not yet approved or cleared by the [place]  FDA and has been authorized for detection and/or diagnosis of SARS-CoV-2 by FDA under an Emergency Use Authorization (EUA). This EUA will remain in effect (meaning this test can be used) for the duration of the COVID-19 declaration under Section 564(b)(1) of the Act, 21 U.S.C. section 360bbb-3(b)(1), unless the authorization is terminated or revoked.     Resp Syncytial Virus by PCR NEGATIVE NEGATIVE Final    Comment: (NOTE) Fact Sheet for Patients: bloggercourse.com  Fact Sheet for Healthcare Providers: seriousbroker.it  This test is not yet approved or cleared by the [place]  FDA and has been authorized for detection and/or diagnosis of SARS-CoV-2 by FDA under an Emergency Use Authorization (EUA). This EUA will remain in effect (meaning this test can be used) for  the duration of the COVID-19 declaration under Section 564(b)(1) of the Act, 21 U.S.C. section 360bbb-3(b)(1), unless the authorization is terminated or revoked.  Performed at [facility], [address] [place], [place] [address]     Labs: CBC: Recent Labs  Lab 05/19/24 1601 05/20/24 0230 05/21/24  9682 05/22/24 0546  WBC 8.5 8.9 6.6 6.9  NEUTROABS  --   --   --  5.7  HGB 9.4* 8.8* 9.6* 9.8*  HCT 31.4* 28.2* 30.3* 32.1*  MCV 94.6 91.6 89.4 91.2  PLT 234 213 205 220   Basic Metabolic Panel: Recent Labs  Lab 05/19/24 1601 05/20/24 0230 05/21/24 0317 05/22/24 0546  NA 137 138 137 135  K 4.3 4.1 3.7 4.0  CL 101 100 96* 96*  CO2 22 24 29 27   GLUCOSE 212* 113* 99 104*  BUN 30* 30* 31* 29*  CREATININE 1.81* 1.84* 1.93* 1.85*  CALCIUM  8.8* 8.5* 8.3* 8.4*  MG  --  2.1  --   --    Liver Function Tests: No results for input(s): AST, ALT, ALKPHOS, BILITOT, PROT, ALBUMIN in the last 168 hours. CBG: Recent Labs  Lab 05/23/24 1144 05/23/24 1711 05/23/24 2121 05/24/24 0808 05/24/24 1126  GLUCAP 172* 104* 162* 128* 152*    Discharge time spent:  39 minutes.  Signed: Drue ONEIDA Potter, MD [facility] 05/24/2024 "

## 2024-05-24 NOTE — TOC Transition Note (Signed)
 Transition of Care [facility]) - Discharge Note   Patient Details  Name: Thomas Moore MRN: [id number] Date of Birth: Nov 16, 1955  Transition of [facility]) CM/SW Contact:  Victory Jackquline RAMAN, RN Phone Number: 05/24/2024, 12:58 PM   Clinical Narrative:    Patient is discharging to [facility] RM[facility] E3, nurse to call report to [phone]. Ex Wife [name] notified @ [phone] that patient is discharging today. Transportation set up with [facility] and is next in line for pick up. MD and bedside nurse made aware. No further concerns. RNCM signing off.         Patient Goals and CMS Choice            Discharge Placement                       Discharge Plan and Services Additional resources added to the After Visit Summary for                                       Social Drivers of Health (SDOH) Interventions SDOH Screenings   Food Insecurity: No Food Insecurity (05/22/2024)  Housing: Low Risk (05/22/2024)  Transportation Needs: No Transportation Needs (05/22/2024)  Utilities: Not At Risk (05/22/2024)  Financial Resource Strain: Low Risk  (03/06/2024)   [facility] from [facility]  Social Connections: Moderately Integrated (05/22/2024)  Tobacco Use: Medium Risk (05/19/2024)     Readmission Risk Interventions    05/22/2024    1:47 PM 05/06/2024   11:17 AM  Readmission Risk Prevention Plan  Transportation Screening Complete Complete  PCP or Specialist Appt within 3-5 Days  Complete  Social Work Consult for Recovery Care Planning/Counseling  Complete  Medication Review Oceanographer) Complete Complete  PCP or Specialist appointment within 3-5 days of discharge Complete   HRI or Home Care Consult Complete   SW Recovery Care/Counseling Consult Complete   Palliative Care Screening Not Applicable   Skilled Nursing Facility Complete

## 2024-05-26 LAB — GLUCOSE, CAPILLARY: Glucose-Capillary: 129 mg/dL — ABNORMAL HIGH (ref 70–99)

## 2024-06-02 ENCOUNTER — Ambulatory Visit: Admitting: Cardiovascular Disease

## 2024-06-12 ENCOUNTER — Ambulatory Visit: Admitting: Cardiovascular Disease

## 2024-06-21 ENCOUNTER — Ambulatory Visit: Attending: Cardiology

## 2024-06-21 DIAGNOSIS — R55 Syncope and collapse: Secondary | ICD-10-CM | POA: Diagnosis not present

## 2024-06-24 LAB — CUP PACEART REMOTE DEVICE CHECK
Date Time Interrogation Session: 20260116230514
Implantable Pulse Generator Implant Date: 20210503

## 2024-06-26 ENCOUNTER — Ambulatory Visit: Payer: Self-pay | Admitting: Cardiology

## 2024-06-26 NOTE — Progress Notes (Signed)
 Remote Loop Recorder Transmission

## 2024-06-29 ENCOUNTER — Emergency Department

## 2024-06-29 ENCOUNTER — Inpatient Hospital Stay
Admission: EM | Admit: 2024-06-29 | Discharge: 2024-07-04 | DRG: 291 | Disposition: A | Discharge status: Home / Self Care | Attending: Obstetrics and Gynecology | Admitting: Obstetrics and Gynecology

## 2024-06-29 ENCOUNTER — Other Ambulatory Visit: Payer: Self-pay

## 2024-06-29 DIAGNOSIS — Z7902 Long term (current) use of antithrombotics/antiplatelets: Secondary | ICD-10-CM

## 2024-06-29 DIAGNOSIS — E039 Hypothyroidism, unspecified: Secondary | ICD-10-CM | POA: Diagnosis not present

## 2024-06-29 DIAGNOSIS — I5043 Acute on chronic combined systolic (congestive) and diastolic (congestive) heart failure: Secondary | ICD-10-CM | POA: Diagnosis present

## 2024-06-29 DIAGNOSIS — N1832 Chronic kidney disease, stage 3b: Secondary | ICD-10-CM | POA: Diagnosis present

## 2024-06-29 DIAGNOSIS — I2510 Atherosclerotic heart disease of native coronary artery without angina pectoris: Secondary | ICD-10-CM | POA: Diagnosis present

## 2024-06-29 DIAGNOSIS — Z947 Corneal transplant status: Secondary | ICD-10-CM

## 2024-06-29 DIAGNOSIS — Z951 Presence of aortocoronary bypass graft: Secondary | ICD-10-CM

## 2024-06-29 DIAGNOSIS — R5381 Other malaise: Secondary | ICD-10-CM | POA: Diagnosis present

## 2024-06-29 DIAGNOSIS — E1165 Type 2 diabetes mellitus with hyperglycemia: Secondary | ICD-10-CM | POA: Diagnosis present

## 2024-06-29 DIAGNOSIS — Z952 Presence of prosthetic heart valve: Secondary | ICD-10-CM

## 2024-06-29 DIAGNOSIS — I480 Paroxysmal atrial fibrillation: Secondary | ICD-10-CM | POA: Diagnosis not present

## 2024-06-29 DIAGNOSIS — Z87891 Personal history of nicotine dependence: Secondary | ICD-10-CM

## 2024-06-29 DIAGNOSIS — J449 Chronic obstructive pulmonary disease, unspecified: Secondary | ICD-10-CM | POA: Diagnosis present

## 2024-06-29 DIAGNOSIS — H9190 Unspecified hearing loss, unspecified ear: Secondary | ICD-10-CM | POA: Diagnosis present

## 2024-06-29 DIAGNOSIS — J188 Other pneumonia, unspecified organism: Secondary | ICD-10-CM

## 2024-06-29 DIAGNOSIS — E785 Hyperlipidemia, unspecified: Secondary | ICD-10-CM | POA: Diagnosis not present

## 2024-06-29 DIAGNOSIS — R42 Dizziness and giddiness: Secondary | ICD-10-CM

## 2024-06-29 DIAGNOSIS — Z91148 Patient's other noncompliance with medication regimen for other reason: Secondary | ICD-10-CM

## 2024-06-29 DIAGNOSIS — N179 Acute kidney failure, unspecified: Secondary | ICD-10-CM | POA: Diagnosis present

## 2024-06-29 DIAGNOSIS — Z1152 Encounter for screening for COVID-19: Secondary | ICD-10-CM

## 2024-06-29 DIAGNOSIS — I509 Heart failure, unspecified: Principal | ICD-10-CM

## 2024-06-29 DIAGNOSIS — E119 Type 2 diabetes mellitus without complications: Secondary | ICD-10-CM

## 2024-06-29 DIAGNOSIS — I130 Hypertensive heart and chronic kidney disease with heart failure and stage 1 through stage 4 chronic kidney disease, or unspecified chronic kidney disease: Principal | ICD-10-CM | POA: Diagnosis present

## 2024-06-29 DIAGNOSIS — Z8673 Personal history of transient ischemic attack (TIA), and cerebral infarction without residual deficits: Secondary | ICD-10-CM

## 2024-06-29 DIAGNOSIS — I252 Old myocardial infarction: Secondary | ICD-10-CM

## 2024-06-29 DIAGNOSIS — J9601 Acute respiratory failure with hypoxia: Secondary | ICD-10-CM | POA: Diagnosis present

## 2024-06-29 DIAGNOSIS — Z86711 Personal history of pulmonary embolism: Secondary | ICD-10-CM

## 2024-06-29 DIAGNOSIS — Z7985 Long-term (current) use of injectable non-insulin antidiabetic drugs: Secondary | ICD-10-CM

## 2024-06-29 DIAGNOSIS — E1136 Type 2 diabetes mellitus with diabetic cataract: Secondary | ICD-10-CM | POA: Diagnosis present

## 2024-06-29 DIAGNOSIS — Z7984 Long term (current) use of oral hypoglycemic drugs: Secondary | ICD-10-CM

## 2024-06-29 DIAGNOSIS — Z7901 Long term (current) use of anticoagulants: Secondary | ICD-10-CM

## 2024-06-29 DIAGNOSIS — I5042 Chronic combined systolic (congestive) and diastolic (congestive) heart failure: Secondary | ICD-10-CM

## 2024-06-29 DIAGNOSIS — Z79899 Other long term (current) drug therapy: Secondary | ICD-10-CM

## 2024-06-29 DIAGNOSIS — M6281 Muscle weakness (generalized): Secondary | ICD-10-CM | POA: Diagnosis present

## 2024-06-29 DIAGNOSIS — Z794 Long term (current) use of insulin: Secondary | ICD-10-CM

## 2024-06-29 DIAGNOSIS — E1122 Type 2 diabetes mellitus with diabetic chronic kidney disease: Secondary | ICD-10-CM | POA: Diagnosis present

## 2024-06-29 DIAGNOSIS — K219 Gastro-esophageal reflux disease without esophagitis: Secondary | ICD-10-CM | POA: Diagnosis present

## 2024-06-29 DIAGNOSIS — I951 Orthostatic hypotension: Secondary | ICD-10-CM

## 2024-06-29 DIAGNOSIS — I5023 Acute on chronic systolic (congestive) heart failure: Secondary | ICD-10-CM | POA: Diagnosis present

## 2024-06-29 DIAGNOSIS — G40909 Epilepsy, unspecified, not intractable, without status epilepticus: Secondary | ICD-10-CM | POA: Diagnosis present

## 2024-06-29 DIAGNOSIS — Z79890 Hormone replacement therapy (postmenopausal): Secondary | ICD-10-CM

## 2024-06-29 LAB — BASIC METABOLIC PANEL WITH GFR
Anion gap: 15 (ref 5–15)
BUN: 31 mg/dL — ABNORMAL HIGH (ref 8–23)
CO2: 20 mmol/L — ABNORMAL LOW (ref 22–32)
Calcium: 8.4 mg/dL — ABNORMAL LOW (ref 8.9–10.3)
Chloride: 99 mmol/L (ref 98–111)
Creatinine, Ser: 1.51 mg/dL — ABNORMAL HIGH (ref 0.61–1.24)
GFR, Estimated: 50 mL/min — ABNORMAL LOW
Glucose, Bld: 285 mg/dL — ABNORMAL HIGH (ref 70–99)
Potassium: 4.1 mmol/L (ref 3.5–5.1)
Sodium: 135 mmol/L (ref 135–145)

## 2024-06-29 LAB — CBC WITH DIFFERENTIAL/PLATELET
Abs Immature Granulocytes: 0.04 10*3/uL (ref 0.00–0.07)
Basophils Absolute: 0 10*3/uL (ref 0.0–0.1)
Basophils Relative: 0 %
Eosinophils Absolute: 0 10*3/uL (ref 0.0–0.5)
Eosinophils Relative: 0 %
HCT: 31.8 % — ABNORMAL LOW (ref 39.0–52.0)
Hemoglobin: 9.4 g/dL — ABNORMAL LOW (ref 13.0–17.0)
Immature Granulocytes: 1 %
Lymphocytes Relative: 8 %
Lymphs Abs: 0.6 10*3/uL — ABNORMAL LOW (ref 0.7–4.0)
MCH: 26.3 pg (ref 26.0–34.0)
MCHC: 29.6 g/dL — ABNORMAL LOW (ref 30.0–36.0)
MCV: 88.8 fL (ref 80.0–100.0)
Monocytes Absolute: 0.5 10*3/uL (ref 0.1–1.0)
Monocytes Relative: 7 %
Neutro Abs: 6.1 10*3/uL (ref 1.7–7.7)
Neutrophils Relative %: 84 %
Platelets: 154 10*3/uL (ref 150–400)
RBC: 3.58 MIL/uL — ABNORMAL LOW (ref 4.22–5.81)
RDW: 17.3 % — ABNORMAL HIGH (ref 11.5–15.5)
WBC: 7.3 10*3/uL (ref 4.0–10.5)
nRBC: 0 % (ref 0.0–0.2)

## 2024-06-29 LAB — RESP PANEL BY RT-PCR (RSV, FLU A&B, COVID)  RVPGX2
Influenza A by PCR: NEGATIVE
Influenza B by PCR: NEGATIVE
Resp Syncytial Virus by PCR: NEGATIVE
SARS Coronavirus 2 by RT PCR: NEGATIVE

## 2024-06-29 LAB — TROPONIN T, HIGH SENSITIVITY: Troponin T High Sensitivity: 36 ng/L — ABNORMAL HIGH (ref 0–19)

## 2024-06-29 LAB — PRO BRAIN NATRIURETIC PEPTIDE: Pro Brain Natriuretic Peptide: 20181 pg/mL — ABNORMAL HIGH

## 2024-06-29 MED ORDER — AMIODARONE HCL 200 MG PO TABS
200.0000 mg | ORAL_TABLET | Freq: Every day | ORAL | Status: DC
  Administered 2024-06-30 – 2024-07-04 (×5): 200 mg via ORAL
  Filled 2024-06-29 (×5): qty 1

## 2024-06-29 MED ORDER — SPIRONOLACTONE 25 MG PO TABS
25.0000 mg | ORAL_TABLET | Freq: Every day | ORAL | Status: DC
  Administered 2024-06-30 – 2024-07-04 (×5): 25 mg via ORAL
  Filled 2024-06-29 (×5): qty 1

## 2024-06-29 MED ORDER — ATORVASTATIN CALCIUM 80 MG PO TABS
80.0000 mg | ORAL_TABLET | Freq: Every day | ORAL | Status: DC
  Administered 2024-06-30 – 2024-07-03 (×4): 80 mg via ORAL
  Filled 2024-06-29 (×4): qty 1

## 2024-06-29 MED ORDER — VANCOMYCIN HCL IN DEXTROSE 1-5 GM/200ML-% IV SOLN
1000.0000 mg | Freq: Once | INTRAVENOUS | Status: AC
  Administered 2024-06-29: 1000 mg via INTRAVENOUS
  Filled 2024-06-29: qty 200

## 2024-06-29 MED ORDER — CALCIUM CITRATE 950 (200 CA) MG PO TABS
1.0000 | ORAL_TABLET | Freq: Two times a day (BID) | ORAL | Status: DC
  Administered 2024-06-30 – 2024-07-04 (×9): 950 mg via ORAL
  Filled 2024-06-29 (×10): qty 1

## 2024-06-29 MED ORDER — NITROGLYCERIN 0.4 MG SL SUBL: 0.4000 mg | SUBLINGUAL_TABLET | SUBLINGUAL | Status: DC | PRN

## 2024-06-29 MED ORDER — ISOSORBIDE MONONITRATE ER 30 MG PO TB24
15.0000 mg | ORAL_TABLET | Freq: Every day | ORAL | Status: DC
  Administered 2024-06-30 – 2024-07-04 (×5): 15 mg via ORAL
  Filled 2024-06-29 (×5): qty 1

## 2024-06-29 MED ORDER — DICLOFENAC SODIUM 1 % EX GEL
4.0000 g | Freq: Four times a day (QID) | CUTANEOUS | Status: DC
  Administered 2024-06-30: 4 g via TOPICAL
  Filled 2024-06-29 (×3): qty 100

## 2024-06-29 MED ORDER — METOPROLOL SUCCINATE ER 25 MG PO TB24
12.5000 mg | ORAL_TABLET | Freq: Every day | ORAL | Status: DC
  Administered 2024-06-30 – 2024-07-04 (×5): 12.5 mg via ORAL
  Filled 2024-06-29 (×5): qty 1

## 2024-06-29 MED ORDER — ACETAMINOPHEN 650 MG RE SUPP: 650.0000 mg | Freq: Four times a day (QID) | RECTAL | Status: DC | PRN

## 2024-06-29 MED ORDER — MAGNESIUM HYDROXIDE 400 MG/5ML PO SUSP: 30.0000 mL | Freq: Every day | ORAL | Status: DC | PRN

## 2024-06-29 MED ORDER — ACETAMINOPHEN 325 MG PO TABS: 650.0000 mg | ORAL_TABLET | Freq: Four times a day (QID) | ORAL | Status: DC | PRN

## 2024-06-29 MED ORDER — FUROSEMIDE 10 MG/ML IJ SOLN
40.0000 mg | Freq: Two times a day (BID) | INTRAMUSCULAR | Status: DC
  Administered 2024-06-30 – 2024-07-01 (×3): 40 mg via INTRAVENOUS
  Filled 2024-06-29 (×3): qty 4

## 2024-06-29 MED ORDER — CLOPIDOGREL BISULFATE 75 MG PO TABS
75.0000 mg | ORAL_TABLET | Freq: Every day | ORAL | Status: DC
  Administered 2024-06-30 – 2024-07-04 (×5): 75 mg via ORAL
  Filled 2024-06-29 (×5): qty 1

## 2024-06-29 MED ORDER — EMPAGLIFLOZIN 25 MG PO TABS
25.0000 mg | ORAL_TABLET | Freq: Every day | ORAL | Status: DC
  Administered 2024-06-30 – 2024-07-04 (×5): 25 mg via ORAL
  Filled 2024-06-29 (×5): qty 1

## 2024-06-29 MED ORDER — INSULIN GLARGINE-YFGN 100 UNIT/ML ~~LOC~~ SOLN
10.0000 [IU] | Freq: Every day | SUBCUTANEOUS | Status: DC
  Administered 2024-07-01 – 2024-07-04 (×4): 10 [IU] via SUBCUTANEOUS
  Filled 2024-06-29 (×6): qty 0.1

## 2024-06-29 MED ORDER — INSULIN ASPART 100 UNIT/ML IJ SOLN
0.0000 [IU] | Freq: Every day | INTRAMUSCULAR | Status: DC
  Administered 2024-06-30: 2 [IU] via SUBCUTANEOUS
  Administered 2024-07-01 – 2024-07-03 (×2): 3 [IU] via SUBCUTANEOUS
  Filled 2024-06-29: qty 2
  Filled 2024-06-29 (×2): qty 3

## 2024-06-29 MED ORDER — DIVALPROEX SODIUM 250 MG PO DR TAB: 250.0000 mg | DELAYED_RELEASE_TABLET | Freq: Every day | ORAL | Status: DC

## 2024-06-29 MED ORDER — FUROSEMIDE 10 MG/ML IJ SOLN
40.0000 mg | Freq: Once | INTRAMUSCULAR | Status: AC
  Administered 2024-06-29: 40 mg via INTRAVENOUS
  Filled 2024-06-29: qty 4

## 2024-06-29 MED ORDER — LEVOTHYROXINE SODIUM 50 MCG PO TABS
50.0000 ug | ORAL_TABLET | Freq: Every day | ORAL | Status: DC
  Administered 2024-06-30 – 2024-07-04 (×5): 50 ug via ORAL
  Filled 2024-06-29 (×5): qty 1

## 2024-06-29 MED ORDER — APIXABAN 5 MG PO TABS
5.0000 mg | ORAL_TABLET | Freq: Two times a day (BID) | ORAL | Status: DC
  Administered 2024-06-30 – 2024-07-03 (×6): 5 mg via ORAL
  Filled 2024-06-29 (×6): qty 1

## 2024-06-29 MED ORDER — ADULT MULTIVITAMIN W/MINERALS CH
1.0000 | ORAL_TABLET | Freq: Every day | ORAL | Status: DC
  Administered 2024-06-30 – 2024-07-04 (×5): 1 via ORAL
  Filled 2024-06-29 (×5): qty 1

## 2024-06-29 MED ORDER — VITAMIN B-12 1000 MCG PO TABS
1000.0000 ug | ORAL_TABLET | Freq: Every day | ORAL | Status: DC
  Administered 2024-06-30 – 2024-07-04 (×5): 1000 ug via ORAL
  Filled 2024-06-29: qty 1
  Filled 2024-06-29: qty 2
  Filled 2024-06-29 (×3): qty 1

## 2024-06-29 MED ORDER — INSULIN ASPART 100 UNIT/ML IJ SOLN
0.0000 [IU] | Freq: Three times a day (TID) | INTRAMUSCULAR | Status: DC
  Administered 2024-06-30 (×2): 2 [IU] via SUBCUTANEOUS
  Administered 2024-06-30: 3 [IU] via SUBCUTANEOUS
  Administered 2024-07-01: 8 [IU] via SUBCUTANEOUS
  Administered 2024-07-02 (×2): 2 [IU] via SUBCUTANEOUS
  Administered 2024-07-02: 5 [IU] via SUBCUTANEOUS
  Administered 2024-07-03 (×3): 3 [IU] via SUBCUTANEOUS
  Administered 2024-07-04: 2 [IU] via SUBCUTANEOUS
  Administered 2024-07-04: 5 [IU] via SUBCUTANEOUS
  Administered 2024-07-04: 3 [IU] via SUBCUTANEOUS
  Filled 2024-06-29: qty 3
  Filled 2024-06-29 (×2): qty 2
  Filled 2024-06-29 (×2): qty 3
  Filled 2024-06-29: qty 5
  Filled 2024-06-29 (×2): qty 3
  Filled 2024-06-29: qty 2
  Filled 2024-06-29: qty 8
  Filled 2024-06-29 (×2): qty 2
  Filled 2024-06-29: qty 5

## 2024-06-29 MED ORDER — TRAZODONE HCL 50 MG PO TABS
25.0000 mg | ORAL_TABLET | Freq: Every evening | ORAL | Status: DC | PRN
  Administered 2024-06-30: 25 mg via ORAL
  Filled 2024-06-29: qty 1

## 2024-06-29 MED ORDER — CYCLOPENTOLATE HCL 1 % OP SOLN
1.0000 [drp] | Freq: Two times a day (BID) | OPHTHALMIC | Status: DC
  Administered 2024-06-30 – 2024-07-04 (×8): 1 [drp] via OPHTHALMIC
  Filled 2024-06-29: qty 0.1
  Filled 2024-06-29: qty 2

## 2024-06-29 MED ORDER — SODIUM CHLORIDE 0.9 % IV SOLN
2.0000 g | Freq: Once | INTRAVENOUS | Status: AC
  Administered 2024-06-29: 2 g via INTRAVENOUS
  Filled 2024-06-29: qty 12.5

## 2024-06-29 MED ORDER — IOHEXOL 350 MG/ML SOLN
75.0000 mL | Freq: Once | INTRAVENOUS | Status: AC | PRN
  Administered 2024-06-29: 75 mL via INTRAVENOUS

## 2024-06-29 NOTE — Assessment & Plan Note (Signed)
" -  The patient will be admitted to a cardiac telemetry bed. - We will continue diuresis with IV Lasix . - We Will follow serial troponins. - We will follow I's and O's and daily weights. - Cardiology consult will be obtained. - I notified [facility] group about the patient. - Case management consult will be obtained to assess the need for home health, home situation and possible need for placement.  "

## 2024-06-29 NOTE — H&P (Incomplete)
 "     Thomas Moore   PATIENT NAME: Thomas Moore    MR#:  [id number]  DATE OF BIRTH:  19-Mar-1956  DATE OF ADMISSION:  06/29/2024  PRIMARY CARE PHYSICIAN: Lenon Layman ORN, MD   Patient is coming from: Home  REQUESTING/REFERRING PHYSICIAN: Gordan Huxley, MD  CHIEF COMPLAINT:   Chief Complaint  Patient presents with   Shortness of Breath    HISTORY OF PRESENT ILLNESS:  Thomas Moore is a 69 y.o. male with medical history significant for coronary artery disease status post four-vessel CABG, CHF, COPD, CVA, type 2 diabetes mellitus, GERD, hypertension and dyslipidemia, seizure disorder and status post TAVR, who presented to the emergency room with acute onset of worsening dyspnea over the last couple of weeks that deteriorated over the last couple of days.  He admitted to worsening lower extremity edema as well as dyspnea on exertion and orthopnea.  No cough or wheezing or dyspnea...  No nausea or vomiting or abdominal pain.  No dysuria, oliguria or hematuria or flank pain.  ED Course: When the patient came to the ER, BP was 150/75 with respiratory rate of 24 and otherwise normal vital signs.  Labs revealed hyperglycemia of 285, CO2 of 28, BUN of 31 and creatinine 1.51 better than previous levels.  proBNP was 20,181 and high-sensitivity troponin T was 36.  CBC showed hemoglobin 9.4 hematocrit 31.8 close to previous levels. EKG as reviewed by me : EKG showed accelerated junctional rhythm with a rate of 74 and T wave inversion inferolaterally. Imaging: Portable chest x-ray showed mild pulmonary edema with patchy bibasilar airspace opacities, small bilateral pleural effusions, cardiomegaly with TAVR stent, postoperative CABG changes and cardiac loop recorder.  The patient was given 40 mg of IV Lasix , IV cefepime  and vancomycin .  The patient will be admitted to progressive unit observation bed for further evaluation and management. PAST MEDICAL HISTORY:   Past Medical History:  Diagnosis  Date   CAD (coronary artery disease) of artery bypass graft    s/p CABG x 4 in 1997   CHF (congestive heart failure) (HCC)    COPD (chronic obstructive pulmonary disease) (HCC)    Coronary artery disease    CVA (cerebral vascular accident) (HCC)    Diabetes mellitus without complication (HCC)    Dysrhythmia    GERD (gastroesophageal reflux disease)    HLD (hyperlipidemia)    Hypertension    S/P TAVR (transcatheter aortic valve replacement) 07/05/2021   with Edwards 26mm S3UR via TF approach with Dr. Wonda and Dr. Lucas   Seizures [facility])     PAST SURGICAL HISTORY:   Past Surgical History:  Procedure Laterality Date   BYPASS GRAFT ANGIOGRAPHY N/A 04/25/2021   Procedure: BYPASS GRAFT ANGIOGRAPHY;  Surgeon: Lawyer Bernardino Cough, MD;  Location: [facility] INVASIVE CV LAB;  Service: Cardiovascular;  Laterality: N/A;   CARDIAC SURGERY     CORNEAL TRANSPLANT Right    CORONARY ARTERY BYPASS GRAFT  1997   ELECTROPHYSIOLOGY STUDY N/A 09/08/2021   Procedure: ELECTROPHYSIOLOGY STUDY;  Surgeon: Cindie Ole DASEN, MD;  Location: [facility] INVASIVE CV LAB;  Service: Cardiovascular;  Laterality: N/A;   EYE SURGERY     FLEXOR TENDON REPAIR Left 07/11/2019   Procedure: FLEXOR tenolysis  REPAIR LEFT RING FINGER with tednon repair;  Surgeon: Kathlynn Sharper, MD;  Location: [facility] ORS;  Service: Orthopedics;  Laterality: Left;   INCISION AND DRAINAGE ABSCESS Left 05/08/2019   Procedure: INCISION AND DRAINAGE ABSCESS;  Surgeon: Mardee Lynwood SQUIBB, MD;  Location[facility]  ORS;  Service: Orthopedics;  Laterality: Left;   INTRAOPERATIVE TRANSTHORACIC ECHOCARDIOGRAM N/A 07/05/2021   Procedure: INTRAOPERATIVE TRANSTHORACIC ECHOCARDIOGRAM;  Surgeon: Wonda Sharper, MD;  Location: [facility] OR;  Service: Open Heart Surgery;  Laterality: N/A;   LEFT HEART CATH AND CORONARY ANGIOGRAPHY Left 06/09/2022   Procedure: LEFT HEART CATH AND CORONARY ANGIOGRAPHY;  Surgeon: Fernand Denyse LABOR, MD;  Location: [facility] INVASIVE CV LAB;   Service: Cardiovascular;  Laterality: Left;   LEFT HEART CATH AND CORONARY ANGIOGRAPHY Left 09/27/2023   Procedure: LEFT HEART CATH AND CORONARY ANGIOGRAPHY with possible coronary intervention;  Surgeon: Fernand Denyse LABOR, MD;  Location: [facility] INVASIVE CV LAB;  Service: Cardiovascular;  Laterality: Left;   LEFT HEART CATH AND CORS/GRAFTS ANGIOGRAPHY N/A 10/02/2019   Procedure: LEFT HEART CATH AND CORS/GRAFTS ANGIOGRAPHY;  Surgeon: Anner Alm ORN, MD;  Location: [facility] INVASIVE CV LAB;  Service: Cardiovascular;  Laterality: N/A;   LEFT HEART CATH AND CORS/GRAFTS ANGIOGRAPHY N/A 07/17/2022   Procedure: LEFT HEART CATH AND CORS/GRAFTS ANGIOGRAPHY and possible PCI and stent;  Surgeon: Fernand Denyse LABOR, MD;  Location: [facility] INVASIVE CV LAB;  Service: Cardiovascular;  Laterality: N/A;   LEFT HEART CATH AND CORS/GRAFTS ANGIOGRAPHY N/A 05/05/2024   Procedure: LEFT HEART CATH AND CORS/GRAFTS ANGIOGRAPHY;  Surgeon: Florencio Cara BIRCH, MD;  Location: [facility] INVASIVE CV LAB;  Service: Cardiovascular;  Laterality: N/A;   LOOP RECORDER INSERTION N/A 10/06/2019   Procedure: LOOP RECORDER INSERTION;  Surgeon: Waddell Danelle ORN, MD;  Location: [facility] INVASIVE CV LAB;  Service: Cardiovascular;  Laterality: N/A;   TRANSCATHETER AORTIC VALVE REPLACEMENT, TRANSFEMORAL N/A 07/05/2021   Procedure: TRANSCATHETER AORTIC VALVE REPLACEMENT, TRANSFEMORAL;  Surgeon: Wonda Sharper, MD;  Location: [facility] OR;  Service: Open Heart Surgery;  Laterality: N/A;   TRIGGER FINGER RELEASE Left     SOCIAL HISTORY:   Social History   Tobacco Use   Smoking status: Former    Current packs/day: 0.00    Types: Cigarettes    Quit date: 1984    Years since quitting: 42.0   Smokeless tobacco: Never   Tobacco comments:    Quit 40 years ago  Substance Use Topics   Alcohol use: Not Currently    FAMILY HISTORY:   Family History  Problem Relation Age of Onset   Seizures Brother     DRUG ALLERGIES:  Allergies[1]  REVIEW OF SYSTEMS:    ROS As per history of present illness. All pertinent systems were reviewed above. Constitutional, HEENT, cardiovascular, respiratory, GI, GU, musculoskeletal, neuro, psychiatric, endocrine, integumentary and hematologic systems were reviewed and are otherwise negative/unremarkable except for positive findings mentioned above in the HPI.   MEDICATIONS AT HOME:   Prior to Admission medications  Medication Sig Start Date End Date Taking? Authorizing Provider  acetaminophen  (TYLENOL ) 650 MG CR tablet Take 1,300 mg by mouth every 8 (eight) hours as needed for pain.    [provider]  amiodarone  (PACERONE ) 100 MG tablet Take 2 tablets (200 mg total) by mouth daily. 05/07/24   Al-Sultani, Anmar, MD  apixaban  (ELIQUIS ) 5 MG TABS tablet Take 1 tablet (5 mg total) by mouth 2 (two) times daily. 11/14/22   Riddle, Suzann, NP  atorvastatin  (LIPITOR ) 80 MG tablet Take 1 tablet (80 mg total) by mouth at bedtime. 08/06/23   Barbarann Nest, MD  Calcium  Citrate-Vitamin D  (CALCIUM  + D PO) Take 1 tablet by mouth 2 (two) times daily.    [provider]  clopidogrel  (PLAVIX ) 75 MG tablet Take 1 tablet by mouth once daily 02/15/24  Fernand Denyse LABOR, MD  cyanocobalamin  (VITAMIN B12) 1000 MCG tablet Take 1 tablet (1,000 mcg total) by mouth daily. 03/20/24   Caleen Qualia, MD  cyclopentolate  (CYCLODRYL,CYCLOGYL ) 1 % ophthalmic solution Place 1 drop into the left eye 2 (two) times daily.    [provider]  diclofenac  Sodium (VOLTAREN ) 1 % GEL Apply 4 g topically 4 (four) times daily. 02/21/24 02/20/25  [provider]  divalproex  (DEPAKOTE ) 250 MG DR tablet Take 250 mg by mouth at bedtime. 04/29/24   [provider]  insulin  degludec (TRESIBA ) 100 UNIT/ML FlexTouch Pen Inject 10 Units into the skin daily. 07/11/23   [provider]  isosorbide  mononitrate (IMDUR ) 30 MG 24 hr tablet Take 0.5 tablets (15 mg total) by mouth daily. 05/08/24   Al-Sultani, Anmar, MD  JARDIANCE   25 MG TABS tablet Take 25 mg by mouth daily.    [provider]  levothyroxine  (SYNTHROID ) 50 MCG tablet Take 50 mcg by mouth daily before breakfast. 04/28/24 04/28/25  [provider]  metoprolol  succinate (TOPROL  XL) 25 MG 24 hr tablet Take 0.5 tablets (12.5 mg total) by mouth daily. 05/25/24   Dorinda Drue DASEN, MD  moxifloxacin (VIGAMOX) 0.5 % ophthalmic solution Place 1 drop into the left eye 3 (three) times daily. 04/29/24   [provider]  Multiple Vitamin (MULTIVITAMIN) capsule Take 1 capsule by mouth daily.    [provider]  mupirocin ointment (BACTROBAN) 2 % Apply 1 Application topically 2 (two) times daily. 02/13/24   [provider]  nitroGLYCERIN  (NITROSTAT ) 0.4 MG SL tablet Place 1 tablet (0.4 mg total) under the tongue every 5 (five) minutes as needed for chest pain. 08/03/23   Scoggins, Amber, NP  ofloxacin  (OCUFLOX ) 0.3 % ophthalmic solution Place 1 drop into the left eye 4 (four) times daily. 01/23/24   [provider]  OXERVATE  0.002 % SOLN Place 1 drop into both eyes 6 (six) times daily. 04/22/24   [provider]  prednisoLONE  acetate (PRED FORTE ) 1 % ophthalmic suspension Place 1 drop into the left eye 4 (four) times daily. 01/16/24   [provider]  spironolactone  (ALDACTONE ) 25 MG tablet Take 1 tablet (25 mg total) by mouth daily. 05/25/24   Dorinda Drue DASEN, MD  torsemide  (DEMADEX ) 20 MG tablet Take 1 tablet (20 mg total) by mouth daily. 03/20/24   Caleen Qualia, MD  TRULICITY  4.5 MG/0.5ML SOAJ Inject 4.5 mg into the skin once a week. 02/12/24   [provider]      VITAL SIGNS:  Blood pressure 128/77, pulse 73, temperature 97.6 F (36.4 C), temperature source Oral, resp. rate (!) 26, height 5' 2 (1.575 m), weight 72.2 kg, SpO2 100%.  PHYSICAL EXAMINATION:  Physical Exam  GENERAL:  69 y.o.-year-old male patient lying in the bed with no acute distress.  EYES: Pupils equal, round, reactive to  light and accommodation. No scleral icterus. Extraocular muscles intact.  HEENT: Head atraumatic, normocephalic. Oropharynx and nasopharynx clear.  NECK:  Supple, no jugular venous distention. No thyroid  enlargement, no tenderness.  LUNGS: Diminished bibasilar breath sounds with bibasal crackles.. No use of accessory muscles of respiration.  CARDIOVASCULAR: Regular rate and rhythm, S1, S2 normal. No murmurs, rubs, or gallops.  ABDOMEN: Soft, nondistended, nontender. Bowel sounds present. No organomegaly or mass.  EXTREMITIES: Bilateral lower extremity pitting edema without cyanosis, or clubbing.  NEUROLOGIC: Cranial nerves II through XII are intact. Muscle strength 5/5 in all extremities. Sensation intact. Gait not checked.  PSYCHIATRIC: The patient is  alert and oriented x 3.  Normal affect and good eye contact. SKIN: No obvious rash, lesion, or ulcer.   LABORATORY PANEL:   CBC Recent Labs  Lab 06/29/24 1934  WBC 7.3  HGB 9.4*  HCT 31.8*  PLT 154   ------------------------------------------------------------------------------------------------------------------  Chemistries  Recent Labs  Lab 06/29/24 1934  NA 135  K 4.1  CL 99  CO2 20*  GLUCOSE 285*  BUN 31*  CREATININE 1.51*  CALCIUM  8.4*   ------------------------------------------------------------------------------------------------------------------  Cardiac Enzymes No results for input(s): TROPONINI in the last 168 hours. ------------------------------------------------------------------------------------------------------------------  RADIOLOGY:  CT Angio Chest PE W/Cm &/Or Wo Cm Result Date: 06/29/2024 EXAM: CTA of the Chest with contrast for PE 06/29/2024 09:33:08 PM TECHNIQUE: CTA of the chest was performed after the administration of 75 mL of iohexol  (OMNIPAQUE ) 350 MG/ML injection. Multiplanar reformatted images are provided for review. MIP images are provided for review. Automated exposure control,  iterative reconstruction, and/or weight based adjustment of the mA/kV was utilized to reduce the radiation dose to as low as reasonably achievable. COMPARISON: Chest radiograph 06/29/2024 and CT chest 01/17/2024. CLINICAL HISTORY: Pulmonary embolism (PE) suspected, high prob. Shortness of breath and chest pain. Cough for 1.5 weeks. FINDINGS: PULMONARY ARTERIES: Pulmonary arteries are adequately opacified for evaluation. No pulmonary embolism. Main pulmonary artery is normal in caliber. MEDIASTINUM: Cardiac enlargement. Aortic valve replacement. Coronary artery bypass. No pericardial effusions. Calcification of the aorta without aneurysm. The thyroid  gland is unremarkable. Esophagus is decompressed. Postoperative changes with median sternotomy. LYMPH NODES: Prominent mediastinal lymph nodes with pretracheal lymph nodes measuring up to about 1.3 cm diameter. Lymphadenopathy is similar to the prior study, most likely reactive. No axillary lymphadenopathy. LUNGS AND PLEURA: Moderate bilateral pleural effusions. Atelectasis or consolidation in the lung bases. Peribronchial thickening with mucous plugging. Patchy ground glass infiltrates throughout the lungs with a mosaic pattern likely representing edema or possibly multifocal pneumonia. No pneumothorax. UPPER ABDOMEN: Intrarenal stone on the left measuring 6 mm in diameter. No hydronephrosis. SOFT TISSUES AND BONES: Degenerative changes in the spine. No acute bony abnormalities. No acute soft tissue abnormality. IMPRESSION: 1. No evidence of pulmonary embolism. 2. Moderate bilateral pleural effusions. 3. Patchy ground glass infiltrates throughout the lungs with a mosaic pattern, likely representing edema or possibly multifocal pneumonia. 4. Bibasilar atelectasis or consolidation. 5. Peribronchial thickening with mucous plugging, which can be seen with bronchitis. Electronically signed by: Elsie Gravely MD 06/29/2024 09:40 PM EST RP Workstation: [id number]   DG Chest  2 View Result Date: 06/29/2024 EXAM: 2 VIEW(S) XRAY OF THE CHEST 06/29/2024 06:58:00 PM COMPARISON: 05/19/2024 CLINICAL HISTORY: Shortness of breath. FINDINGS: LUNGS AND PLEURA: Mild pulmonary edema. Patchy airspace opacities in lung bases. Small bilateral pleural effusions. No pneumothorax. HEART AND MEDIASTINUM: Cardiomegaly. TAVR stent in place. CABG markers and sternotomy wires noted. Cardiac loop recorder device in place. BONES AND SOFT TISSUES: No acute osseous abnormality. IMPRESSION: 1. Mild pulmonary edema with patchy bibasilar airspace opacities. 2. Small bilateral pleural effusions. 3. Cardiomegaly with TAVR stent, postoperative CABG changes, and cardiac loop recorder. Electronically signed by: Elsie Gravely MD 06/29/2024 07:01 PM EST RP Workstation: [id number]      IMPRESSION AND PLAN:  Assessment and Plan: No notes have been filed under this hospital service. Service: Hospitalist      DVT prophylaxis: Lovenox ***  Advanced Care Planning:  Code Status: full code***  Family Communication:  The plan of care was discussed in details with the patient (and family). I answered all questions. The patient agreed to proceed  with the above mentioned plan. Further management will depend upon hospital course. Disposition Plan: Back to previous home environment Consults called: none***  All the records are reviewed and case discussed with ED provider.  Status is: Observation {Observation:23811}   At the time of the admission, it appears that the appropriate admission status for this patient is inpatient.  This is judged to be reasonable and necessary in order to provide the required intensity of service to ensure the patient's safety given the presenting symptoms, physical exam findings and initial radiographic and laboratory data in the context of comorbid conditions.  The patient requires inpatient status due to high intensity of service, high risk of further deterioration and high  frequency of surveillance required.  I certify that at the time of admission, it is my clinical judgment that the patient will require inpatient hospital care extending more than 2 midnights.                            Dispo: The patient is from: Home              Anticipated d/c is to: Home              Patient currently is not medically stable to d/c.              Difficult to place patient: No  Madison DELENA Peaches M.D on 06/29/2024 at 10:22 PM  [facility]   From 7 PM-7 AM, contact night-coverage [email or web address]  CC: Primary care physician; Lenon Layman ORN, MD       [1] No Known Allergies "

## 2024-06-29 NOTE — ED Provider Notes (Signed)
 "  [facility] Provider Note    Event Date/Time   First MD Initiated Contact with Patient 06/29/24 1855     (approximate)   History   Shortness of Breath   HPI Thomas Moore is a 69 y.o. male with a well-documented history of CHF, reportedly COPD (although he was unaware of this diagnosis), coronary artery disease with multiple prior episodes of ACS, diabetes, history of pulmonary embolism currently on Eliquis , etc.  He presents for evaluation of gradually worsening shortness of breath over the last 1 to 2 weeks which has gotten worse over the last 1 to 2 days.  He said he is not sure what his baseline weight is but he has had some swelling in his legs.  He has been having some discomfort in his chest particularly when he bends over or moves around and gets short winded very quickly if he has any amount of exertion.  Lying flat makes it worse.  No recent fever but he has been coughing my fool head off.  He is uncertain if he has been taking all of his medications, he said that it is difficult to keep track of all of them.  He acknowledged that he is supposed to be taking 2 different fluid pills.     Physical Exam   Triage Vital Signs: ED Triage Vitals  Encounter Vitals Group     BP 06/29/24 1839 (!) 150/70     Girls Systolic BP Percentile --      Girls Diastolic BP Percentile --      Boys Systolic BP Percentile --      Boys Diastolic BP Percentile --      Pulse Rate 06/29/24 1839 72     Resp 06/29/24 1839 (!) 24     Temp 06/29/24 1839 97.6 F (36.4 C)     Temp Source 06/29/24 1839 Oral     SpO2 06/29/24 1839 100 %     Weight 06/29/24 1841 72.2 kg (159 lb 1.6 oz)     Height 06/29/24 1841 1.575 m (5' 2)     Head Circumference --      Peak Flow --      Pain Score 06/29/24 1839 0     Pain Loc --      Pain Education --      Exclude from Growth Chart --     Most recent vital signs: Vitals:   06/29/24 2100 06/29/24 2130  BP: (!) 151/78 132/65   Pulse: 77 78  Resp: 15 18  Temp:    SpO2: 100% 100%    General: Awake, alert, appears chronically ill but not in significant distress at the moment.  Conversant, hard of hearing. CV:  Good peripheral perfusion.  Regular rate and rhythm, normal heart sounds. Resp:  Normal effort but with some tachypnea.  No accessory muscle usage but the patient has some coarse breath sounds particular in the lung bases with decreased air movement throughout.   Abd:  No distention.  Other:  Trace to 1+ pitting edema in lower extremities   ED Results / Procedures / Treatments   Labs (all labs ordered are listed, but only abnormal results are displayed) Labs Reviewed  CBC WITH DIFFERENTIAL/PLATELET - Abnormal; Notable for the following components:      Result Value   RBC 3.58 (*)    Hemoglobin 9.4 (*)    HCT 31.8 (*)    MCHC 29.6 (*)    RDW 17.3 (*)  Lymphs Abs 0.6 (*)    All other components within normal limits  BASIC METABOLIC PANEL WITH GFR - Abnormal; Notable for the following components:   CO2 20 (*)    Glucose, Bld 285 (*)    BUN 31 (*)    Creatinine, Ser 1.51 (*)    Calcium  8.4 (*)    GFR, Estimated 50 (*)    All other components within normal limits  PRO BRAIN NATRIURETIC PEPTIDE - Abnormal; Notable for the following components:   Pro Brain Natriuretic Peptide 20,181.0 (*)    All other components within normal limits  TROPONIN T, HIGH SENSITIVITY - Abnormal; Notable for the following components:   Troponin T High Sensitivity 36 (*)    All other components within normal limits  RESP PANEL BY RT-PCR (RSV, FLU A&B, COVID)  [id number]     EKG  ED ECG REPORT I, Darleene Dome, the attending physician, personally viewed and interpreted this ECG.  Date: 06/29/2024 EKG Time: 18: 34 Rate: 74 Rhythm: Accelerated junctional rhythm QRS Axis: normal Intervals: normal ST/T Wave abnormalities: Non-specific ST segment / T-wave changes, but no clear evidence of acute ischemia. Narrative  Interpretation: no definitive evidence of acute ischemia; does not meet STEMI criteria.    RADIOLOGY I independently viewed and interpreted the patient's two-view chest x-ray and he has pulmonary edema throughout.  Radiologist also commented on patchy opacities in both lungs in addition to the pulmonary edema.   PROCEDURES:  Critical Care performed: Yes, see critical care procedure note(s)  .Critical Care  Performed by: Dome Darleene, MD Authorized by: Dome Darleene, MD   Critical care provider statement:    Critical care time (minutes):  30   Critical care time was exclusive of:  Separately billable procedures and treating other patients   Critical care was necessary to treat or prevent imminent or life-threatening deterioration of the following conditions:  Cardiac failure and respiratory failure   Critical care was time spent personally by me on the following activities:  Development of treatment plan with patient or surrogate, evaluation of patient's response to treatment, examination of patient, obtaining history from patient or surrogate, ordering and performing treatments and interventions, ordering and review of laboratory studies, ordering and review of radiographic studies, pulse oximetry, re-evaluation of patient's condition and review of old charts .1-3 Lead EKG Interpretation  Performed by: Dome Darleene, MD Authorized by: Dome Darleene, MD     Interpretation: normal     ECG rate:  74   ECG rate assessment: normal     Rhythm: sinus rhythm     Ectopy: none     Conduction: normal       IMPRESSION / MDM / ASSESSMENT AND PLAN / ED COURSE  I reviewed the triage vital signs and the nursing notes.                              Differential diagnosis includes, but is not limited to, CHF exacerbation, pneumonia, viral illness, COPD exacerbation, less likely PE or ACS  Patient's presentation is most consistent with acute presentation with potential threat to life or bodily  function.  Labs/studies ordered (see ED course for additional labs and studies that may have been added later): 2 view chest x-ray, EKG, CBC with differential, BMP, high-sensitivity troponin, proBNP, respiratory viral panel  Interventions/Medications given:  Medications  vancomycin  (VANCOCIN ) IVPB 1000 mg/200 mL premix (has no administration in time range)  ceFEPIme  (MAXIPIME ) 2 g  in sodium chloride  0.9 % 100 mL IVPB (has no administration in time range)  furosemide  (LASIX ) injection 40 mg (40 mg Intravenous Given 06/29/24 2058)  iohexol  (OMNIPAQUE ) 350 MG/ML injection 75 mL (75 mLs Intravenous Contrast Given 06/29/24 2123)    (Note:  hospital course my include additional interventions and/or labs/studies not listed above.)   Patient is not in significant distress right now but is mildly tachypneic and has been gradually worsening over time.  Reviewing his medical record I see that he has had 6 ED visits in 6 months and 4 admissions.  His most recent discharge summary was written by Dr. Dorinda on 05/24/2024 (just over 1 month ago) and he was admitted at that time for CHF exacerbation and also had evidence of NSTEMI.  This appears to be a similar presentation with pulmonary edema on chest x-ray.  However given the patchy opacities in addition to the pulmonary edema and the pain the patient is reporting, as well as questionable medication compliance, I may evaluate him with a CTA chest if his renal function is adequate to rule out pulmonary embolism.  Regardless it appears he will benefit from hospitalization.  Until I see his electrolytes and renal function I will hold off on furosemide  but he will likely need diuresis and possible empiric antibiotic coverage for healthcare associated pneumonia if in fact his workup is suggestive of a superimposed infectious process  The patient is on the cardiac monitor to evaluate for evidence of arrhythmia and/or significant heart rate changes.   Clinical Course  as of 06/29/24 2154  [place] Jun 29, 2024  2030 CBC with Differential(!) Generally reassuring CBC [CF]  2030 Resp panel by RT-PCR (RSV, Flu A&B, Covid) Anterior Nasal Swab Negative viral panel [CF]  2040 Pro Brain Natriuretic Peptide(!): 20,181.0 [CF]  2041 Elevated BNP, improved from prior, but still substantially elevated.  Only minimal troponin elevation [CF]  2042 Proceeding with CTA to rule out PE and to better assess lung parenchyma for the possibility of superimposed pneumonia [CF]  2042 Ordering 40 mg IV furosemide  [CF]  2146 CT Angio Chest PE W/Cm &/Or Wo Cm I independently viewed and interpreted the patient's CTA chest .  No evidence of PE but the patient has pulmonary edema as well as what appears to be multifocal pneumonia.  Confirmed by radiology.  I am ordering broad-spectrum antibiotics of cefepime  2 g IV and vancomycin  per pharmacy consult given the probability of healthcare associated pneumonia.  He does not meet sepsis criteria.  I am consulting the hospitalist team for admission [CF]  2153 I consulted by phone with the admitting hospitalist, and they will admit the patient - Dr. Lawence [CF]    Clinical Course User Index [CF] Gordan Huxley, MD     FINAL CLINICAL IMPRESSION(S) / ED DIAGNOSES   Final diagnoses:  Acute on chronic congestive heart failure, unspecified heart failure type [facility])  Multifocal pneumonia     Rx / DC Orders   ED Discharge Orders     None        Note:  This document was prepared using Dragon voice recognition software and may include unintentional dictation errors.   Gordan Huxley, MD 06/29/24 2154  "

## 2024-06-29 NOTE — ED Triage Notes (Signed)
 Pt coming from home via EMS c/o SOB. Pt reports having cough for 1.5 weeks now. Pt reports pain on inspiration and SOB when he bends. Pt also reports having trouble keeping up with meds at home.  EMS vitals HR 63 Temp 97.3 F SPO2 93 RA

## 2024-06-29 NOTE — H&P (Incomplete)
 "     [place]   PATIENT NAME: Thomas Moore    MR#:  [id number]  DATE OF BIRTH:  Jan 05, 1956  DATE OF ADMISSION:  06/29/2024  PRIMARY CARE PHYSICIAN: Lenon Layman ORN, MD   Patient is coming from: Home  REQUESTING/REFERRING PHYSICIAN: Gordan Huxley, MD  CHIEF COMPLAINT:   Chief Complaint  Patient presents with   Shortness of Breath    HISTORY OF PRESENT ILLNESS:  Thomas Moore is a 69 y.o. male with medical history significant for coronary artery disease status post four-vessel CABG, CHF, COPD, CVA, type 2 diabetes mellitus, GERD, hypertension and dyslipidemia, seizure disorder and status post TAVR, who presented to the emergency room with acute onset of worsening dyspnea over the last couple of weeks that deteriorated over the last couple of days.  He admitted to worsening lower extremity edema as well as dyspnea on exertion and orthopnea.  No cough or wheezing or dyspnea...  No nausea or vomiting or abdominal pain.  No dysuria, oliguria or hematuria or flank pain.  The patient lives alone and has been having difficulty getting his medications especially given his cataract.  ED Course: When the patient came to the ER, BP was 150/75 with respiratory rate of 24 and otherwise normal vital signs.  Labs revealed hyperglycemia of 285, CO2 of 28, BUN of 31 and creatinine 1.51 better than previous levels.  proBNP was 20,181 and high-sensitivity troponin T was 36.  CBC showed hemoglobin 9.4 hematocrit 31.8 close to previous levels. EKG as reviewed by me : EKG showed accelerated junctional rhythm with a rate of 74 and T wave inversion inferolaterally. Imaging: Portable chest x-ray showed mild pulmonary edema with patchy bibasilar airspace opacities, small bilateral pleural effusions, cardiomegaly with TAVR stent, postoperative CABG changes and cardiac loop recorder.  The patient was given 40 mg of IV Lasix , IV cefepime  and vancomycin .  The patient will be admitted to progressive unit  observation bed for further evaluation and management. PAST MEDICAL HISTORY:   Past Medical History:  Diagnosis Date   CAD (coronary artery disease) of artery bypass graft    s/p CABG x 4 in 1997   CHF (congestive heart failure) (HCC)    COPD (chronic obstructive pulmonary disease) (HCC)    Coronary artery disease    CVA (cerebral vascular accident) (HCC)    Diabetes mellitus without complication (HCC)    Dysrhythmia    GERD (gastroesophageal reflux disease)    HLD (hyperlipidemia)    Hypertension    S/P TAVR (transcatheter aortic valve replacement) 07/05/2021   with Edwards 26mm S3UR via TF approach with Dr. Wonda and Dr. Lucas   Seizures [facility])     PAST SURGICAL HISTORY:   Past Surgical History:  Procedure Laterality Date   BYPASS GRAFT ANGIOGRAPHY N/A 04/25/2021   Procedure: BYPASS GRAFT ANGIOGRAPHY;  Surgeon: Lawyer Bernardino Cough, MD;  Location: [facility] INVASIVE CV LAB;  Service: Cardiovascular;  Laterality: N/A;   CARDIAC SURGERY     CORNEAL TRANSPLANT Right    CORONARY ARTERY BYPASS GRAFT  1997   ELECTROPHYSIOLOGY STUDY N/A 09/08/2021   Procedure: ELECTROPHYSIOLOGY STUDY;  Surgeon: Cindie Ole DASEN, MD;  Location: [facility] INV[facility] CV LAB;  Service: Cardiovascular;  Laterality: N/A;   EYE SURGERY     FLEXOR TENDON REPAIR Left 07/11/2019   Procedure: FLEXOR tenolysis  REPAIR LEFT RING FINGER with tednon repair;  Surgeon: Kathlynn Sharper, MD;  Location: [facility] ORS;  Service: Orthopedics;  Laterality: Left;   INCISION AND DRAINAGE ABSCESS Left  05/08/2019   Procedure: INCISION AND DRAINAGE ABSCESS;  Surgeon: Mardee Lynwood SQUIBB, MD;  Location: [facility] ORS;  Service: Orthopedics;  Laterality: Left;   INTRAOPERATIVE TRANSTHORACIC ECHOCARDIOGRAM N/A 07/05/2021   Procedure: INTRAOPERATIVE TRANSTHORACIC ECHOCARDIOGRAM;  Surgeon: Wonda Sharper, MD;  Location: [facility];  Service: Open Heart Surgery;  Laterality: N/A;   LEFT HEART CATH AND CORONARY ANGIOGRAPHY Left 06/09/2022   Procedure: LEFT HEART CATH  AND CORONARY ANGIOGRAPHY;  Surgeon: Fernand Denyse LABOR, MD;  Location: [facility] INVASIVE CV LAB;  Service: Cardiovascular;  Laterality: Left;   LEFT HEART CATH AND CORONARY ANGIOGRAPHY Left 09/27/2023   Procedure: LEFT HEART CATH AND CORONARY ANGIOGRAPHY with possible coronary intervention;  Surgeon: Fernand Denyse LABOR, MD;  Location: [facility] INVASIVE CV LAB;  Service: Cardiovascular;  Laterality: Left;   LEFT HEART CATH AND CORS/GRAFTS ANGIOGRAPHY N/A 10/02/2019   Procedure: LEFT HEART CATH AND CORS/GRAFTS ANGIOGRAPHY;  Surgeon: Anner Alm ORN, MD;  Location: [facility] INVASIVE CV LAB;  Service: Cardiovascular;  Laterality: N/A;   LEFT HEART CATH AND CORS/GRAFTS ANGIOGRAPHY N/A 07/17/2022   Procedure: LEFT HEART CATH AND CORS/GRAFTS ANGIOGRAPHY and possible PCI and stent;  Surgeon: Fernand Denyse LABOR, MD;  Location: [facility] INVASIVE CV LAB;  Service: Cardiovascular;  Laterality: N/A;   LEFT HEART CATH AND CORS/GRAFTS ANGIOGRAPHY N/A 05/05/2024   Procedure: LEFT HEART CATH AND CORS/GRAFTS ANGIOGRAPHY;  Surgeon: Florencio Cara BIRCH, MD;  Location: [facility] INVASIVE CV LAB;  Service: Cardiovascular;  Laterality: N/A;   LOOP RECORDER INSERTION N/A 10/06/2019   Procedure: LOOP RECORDER INSERTION;  Surgeon: Waddell Danelle ORN, MD;  Location: [facility]VAS[facility] CV [facility];  Service: Cardiovascular;  Laterality: N/A;   TRANSCATHETER AORTIC VALVE REPLACEMENT, TRANSFEMORAL N/A 07/05/2021   Procedure: TRANSCATHETER AORTIC VALVE REPLACEMENT, TRANSFEMORAL;  Surgeon: Wonda Sharper, MD;  Location: [facility] OR;  Service: Open Heart Surgery;  Laterality: N/A;   TRIGGER FINGER RELEASE Left     SOCIAL HISTORY:   Social History   Tobacco Use   Smoking status: Former    Current packs/day: 0.00    Types: Cigarettes    Quit date: 1984    Years since quitting: 42.0   Smokeless tobacco: Never   Tobacco comments:    Quit 40 years ago  Substance Use Topics   Alcohol use: Not Currently    FAMILY HISTORY:   Family History  Problem Relation Age of Onset    Seizures Brother     DRUG ALLERGIES:  Allergies[1]  REVIEW OF SYSTEMS:   ROS As per history of present illness. All pertinent systems were reviewed above. Constitutional, HEENT, cardiovascular, respiratory, GI, GU, musculoskeletal, neuro, psychiatric, endocrine, integumentary and hematologic systems were reviewed and are otherwise negative/unremarkable except for positive findings mentioned above in the HPI.   MEDICATIONS AT HOME:   Prior to Admission medications  Medication Sig Start Date End Date Taking? Authorizing Provider  acetaminophen  (TYLENOL ) 650 MG CR tablet Take 1,300 mg by mouth every 8 (eight) hours as needed for pain.    [provider]  amiodarone  (PACERONE ) 100 MG tablet Take 2 tablets (200 mg total) by mouth daily. 05/07/24   Al-Sultani, Anmar, MD  apixaban  (ELIQUIS ) 5 MG TABS tablet Take 1 tablet (5 mg total) by mouth 2 (two) times daily. 11/14/22   Riddle, Suzann, NP  atorvastatin  (LIPITOR ) 80 MG tablet Take 1 tablet (80 mg total) by mouth at bedtime. 08/06/23   Barbarann Nest, MD  Calcium  Citrate-Vitamin D  (CALCIUM  + D PO) Take 1 tablet by mouth 2 (two) times daily.  [provider]  clopidogrel  (PLAVIX ) 75 MG tablet Take 1 tablet by mouth once daily 02/15/24   Fernand Denyse LABOR, MD  cyanocobalamin  (VITAMIN B12) 1000 MCG tablet Take 1 tablet (1,000 mcg total) by mouth daily. 03/20/24   Caleen Qualia, MD  cyclopentolate  (CYCLODRYL,CYCLOGYL ) 1 % ophthalmic solution Place 1 drop into the left eye 2 (two) times daily.    [provider]  diclofenac  Sodium (VOLTAREN ) 1 % GEL Apply 4 g topically 4 (four) times daily. 02/21/24 02/20/25  [provider]  divalproex  (DEPAKOTE ) 250 MG DR tablet Take 250 mg by mouth at bedtime. 04/29/24   [provider]  insulin  degludec (TRESIBA ) 100 UNIT/ML FlexTouch Pen Inject 10 Units into the skin daily. 07/11/23   [provider]  isosorbide  mononitrate (IMDUR ) 30 MG 24 hr tablet Take 0.5  tablets (15 mg total) by mouth daily. 05/08/24   Al-Sultani, Anmar, MD  JARDIANCE  25 MG TABS tablet Take 25 mg by mouth daily.    [provider]  levothyroxine  (SYNTHROID ) 50 MCG tablet Take 50 mcg by mouth daily before breakfast. 04/28/24 04/28/25  [provider]  metoprolol  succinate (TOPROL  XL) 25 MG 24 hr tablet Take 0.5 tablets (12.5 mg total) by mouth daily. 05/25/24   Dorinda Drue DASEN, MD  moxifloxacin (VIGAMOX) 0.5 % ophthalmic solution Place 1 drop into the left eye 3 (three) times daily. 04/29/24   [provider]  Multiple Vitamin (MULTIVITAMIN) capsule Take 1 capsule by mouth daily.    [provider]  mupirocin ointment (BACTROBAN) 2 % Apply 1 Application topically 2 (two) times daily. 02/13/24   [provider]  nitroGLYCERIN  (NITROSTAT ) 0.4 MG SL tablet Place 1 tablet (0.4 mg total) under the tongue every 5 (five) minutes as needed for chest pain. 08/03/23   Scoggins, Amber, NP  ofloxacin  (OCUFLOX ) 0.3 % ophthalmic solution Place 1 drop into the left eye 4 (four) times daily. 01/23/24   [provider]  OXERVATE  0.002 % SOLN Place 1 drop into both eyes 6 (six) times daily. 04/22/24   [provider]  prednisoLONE  acetate (PRED FORTE ) 1 % ophthalmic suspension Place 1 drop into the left eye 4 (four) times daily. 01/16/24   [provider]  spironolactone  (ALDACTONE ) 25 MG tablet Take 1 tablet (25 mg total) by mouth daily. 05/25/24   Dorinda Drue DASEN, MD  torsemide  (DEMADEX ) 20 MG tablet Take 1 tablet (20 mg total) by mouth daily. 03/20/24   Caleen Qualia, MD  TRULICITY  4.5 MG/0.5ML SOAJ Inject 4.5 mg into the skin once a week. 02/12/24   [provider]      VITAL SIGNS:  Blood pressure 116/82, pulse 73, temperature 97.8 F (36.6 C), temperature source Oral, resp. rate (!) 24, height 5' 2 (1.575 m), weight 72.2 kg, SpO2 96%.  PHYSICAL EXAMINATION:  Physical Exam  GENERAL:  69 y.o.-year-old male patient  lying in the bed with no acute distress.  EYES: Pupils equal, round, reactive to light and accommodation. No scleral icterus. Extraocular muscles intact.  HEENT: Head atraumatic, normocephalic. Oropharynx and nasopharynx clear.  NECK:  Supple, no jugular venous distention. No thyroid  enlargement, no tenderness.  LUNGS: Diminished bibasilar breath sounds with bibasal crackles.. No use of accessory muscles of respiration.  CARDIOVASCULAR: Regular rate and rhythm, S1, S2 normal. No murmurs, rubs, or gallops.  ABDOMEN: Soft, nondistended, nontender. Bowel sounds present. No organomegaly or mass.  EXTREMITIES: Bilateral lower extremity pitting edema without cyanosis, or clubbing.  NEUROLOGIC: Cranial nerves II through  XII are intact. Muscle strength 5/5 in all extremities. Sensation intact. Gait not checked.  PSYCHIATRIC: The patient is alert and oriented x 3.  Normal affect and good eye contact. SKIN: No obvious rash, lesion, or ulcer.   LABORATORY PANEL:   CBC Recent Labs  Lab 06/29/24 1934  WBC 7.3  HGB 9.4*  HCT 31.8*  PLT 154   ------------------------------------------------------------------------------------------------------------------  Chemistries  Recent Labs  Lab 06/29/24 1934  NA 135  K 4.1  CL 99  CO2 20*  GLUCOSE 285*  BUN 31*  CREATININE 1.51*  CALCIUM  8.4*   ------------------------------------------------------------------------------------------------------------------  Cardiac Enzymes No results for input(s): TROPONINI in the last 168 hours. ------------------------------------------------------------------------------------------------------------------  RADIOLOGY:  CT Angio Chest PE W/Cm &/Or Wo Cm Result Date: 06/29/2024 EXAM: CTA of the Chest with contrast for PE 06/29/2024 09:33:08 PM TECHNIQUE: CTA of the chest was performed after the administration of 75 mL of iohexol  (OMNIPAQUE ) 350 MG/ML injection. Multiplanar reformatted images are provided  for review. MIP images are provided for review. Automated exposure control, iterative reconstruction, and/or weight based adjustment of the mA/kV was utilized to reduce the radiation dose to as low as reasonably achievable. COMPARISON: Chest radiograph 06/29/2024 and CT chest 01/17/2024. CLINICAL HISTORY: Pulmonary embolism (PE) suspected, high prob. Shortness of breath and chest pain. Cough for 1.5 weeks. FINDINGS: PULMONARY ARTERIES: Pulmonary arteries are adequately opacified for evaluation. No pulmonary embolism. Main pulmonary artery is normal in caliber. MEDIASTINUM: Cardiac enlargement. Aortic valve replacement. Coronary artery bypass. No pericardial effusions. Calcification of the aorta without aneurysm. The thyroid  gland is unremarkable. Esophagus is decompressed. Postoperative changes with median sternotomy. LYMPH NODES: Prominent mediastinal lymph nodes with pretracheal lymph nodes measuring up to about 1.3 cm diameter. Lymphadenopathy is similar to the prior study, most likely reactive. No axillary lymphadenopathy. LUNGS AND PLEURA: Moderate bilateral pleural effusions. Atelectasis or consolidation in the lung bases. Peribronchial thickening with mucous plugging. Patchy ground glass infiltrates throughout the lungs with a mosaic pattern likely representing edema or possibly multifocal pneumonia. No pneumothorax. UPPER ABDOMEN: Intrarenal stone on the left measuring 6 mm in diameter. No hydronephrosis. SOFT TISSUES AND BONES: Degenerative changes in the spine. No acute bony abnormalities. No acute soft tissue abnormality. IMPRESSION: 1. No evidence of pulmonary embolism. 2. Moderate bilateral pleural effusions. 3. Patchy ground glass infiltrates throughout the lungs with a mosaic pattern, likely representing edema or possibly multifocal pneumonia. 4. Bibasilar atelectasis or consolidation. 5. Peribronchial thickening with mucous plugging, which can be seen with bronchitis. Electronically signed by:  Elsie Gravely MD 06/29/2024 09:40 PM EST RP Workstation: [id number]   DG Chest 2 View Result Date: 06/29/2024 EXAM: 2 VIEW(S) XRAY OF THE CHEST 06/29/2024 06:58:00 PM COMPARISON: 05/19/2024 CLINICAL HISTORY: Shortness of breath. FINDINGS: LUNGS AND PLEURA: Mild pulmonary edema. Patchy airspace opacities in lung bases. Small bilateral pleural effusions. No pneumothorax. HEART AND MEDIASTINUM: Cardiomegaly. TAVR stent in place. CABG markers and sternotomy wires noted. Cardiac loop recorder device in place. BONES AND SOFT TISSUES: No acute osseous abnormality. IMPRESSION: 1. Mild pulmonary edema with patchy bibasilar airspace opacities. 2. Small bilateral pleural effusions. 3. Cardiomegaly with TAVR stent, postoperative CABG changes, and cardiac loop recorder. Electronically signed by: Elsie Gravely MD 06/29/2024 07:01 PM EST RP Workstation: [id number]      IMPRESSION AND PLAN:  Assessment and Plan: * Acute on chronic combined systolic (congestive) and diastolic (congestive) heart failure (HCC)  -The patient will be admitted to a cardiac telemetry bed. - We will continue diuresis with IV Lasix . - We Will  follow serial troponins. - We will follow I's and O's and daily weights. - Cardiology consult will be obtained. - I notified [facility] group about the patient. - Case management consult will be obtained to assess the need for home health, home situation and possible need for placement.   Multifocal pneumonia - Will continue antibiotic therapy with IV Rocephin  and doxycycline . - Mucolytic therapy be provided as well as duo nebs q.i.d. and q.4 hours p.r.n. - We will follow blood cultures.   Hypothyroidism - Will continue Synthroid .  Paroxysmal atrial fibrillation (HCC) - Will continue Eliquis , amiodarone , and Toprol -XL.  Seizure disorder (HCC) - Will continue Depakote .  Coronary artery disease - Will continue beta-blocker therapy, Imdur , Plavix  and statin therapy.  Type 2 diabetes  mellitus without complications (HCC) - The patient will be placed on supplemental coverage with NovoLog . - Will continue basal coverage. - Will continue Jardiance .  Dyslipidemia - Will continue statin therapy.   DVT prophylaxis: Eliquis . Advanced Care Planning:  Code Status: full code. Family Communication:  The plan of care was discussed in details with the patient (and family). I answered all questions. The patient agreed to proceed with the above mentioned plan. Further management will depend upon hospital course. Disposition Plan: Back to previous home environment Consults called: Cardiology All the records are reviewed and case discussed with ED provider.  Status is: Observation  I certify that at the time of admission, it is my clinical judgment that the patient will require hospital care extending less than 2 midnights.                            Dispo: The patient is from: Home              Anticipated d/c is to: Home              Patient currently is not medically stable to d/c.              Difficult to place patient: No  Madison DELENA Peaches M.D on 06/30/2024 at 1:22 AM  [facility]   From 7 PM-7 AM, contact night-coverage [email or web address]  CC: Primary care physician; Lenon Layman ORN, MD     [1] No Known Allergies  "

## 2024-06-29 NOTE — ED Notes (Signed)
 Patient transported to CT

## 2024-06-30 DIAGNOSIS — N1832 Chronic kidney disease, stage 3b: Secondary | ICD-10-CM | POA: Diagnosis present

## 2024-06-30 DIAGNOSIS — Z7984 Long term (current) use of oral hypoglycemic drugs: Secondary | ICD-10-CM | POA: Diagnosis not present

## 2024-06-30 DIAGNOSIS — Z7985 Long-term (current) use of injectable non-insulin antidiabetic drugs: Secondary | ICD-10-CM | POA: Diagnosis not present

## 2024-06-30 DIAGNOSIS — I951 Orthostatic hypotension: Secondary | ICD-10-CM | POA: Diagnosis present

## 2024-06-30 DIAGNOSIS — Z79890 Hormone replacement therapy (postmenopausal): Secondary | ICD-10-CM | POA: Diagnosis not present

## 2024-06-30 DIAGNOSIS — E119 Type 2 diabetes mellitus without complications: Secondary | ICD-10-CM

## 2024-06-30 DIAGNOSIS — E039 Hypothyroidism, unspecified: Secondary | ICD-10-CM | POA: Diagnosis present

## 2024-06-30 DIAGNOSIS — Z1152 Encounter for screening for COVID-19: Secondary | ICD-10-CM | POA: Diagnosis not present

## 2024-06-30 DIAGNOSIS — Z952 Presence of prosthetic heart valve: Secondary | ICD-10-CM | POA: Diagnosis not present

## 2024-06-30 DIAGNOSIS — G40909 Epilepsy, unspecified, not intractable, without status epilepticus: Secondary | ICD-10-CM

## 2024-06-30 DIAGNOSIS — Z7901 Long term (current) use of anticoagulants: Secondary | ICD-10-CM | POA: Diagnosis not present

## 2024-06-30 DIAGNOSIS — I5023 Acute on chronic systolic (congestive) heart failure: Secondary | ICD-10-CM | POA: Diagnosis present

## 2024-06-30 DIAGNOSIS — E785 Hyperlipidemia, unspecified: Secondary | ICD-10-CM | POA: Insufficient documentation

## 2024-06-30 DIAGNOSIS — Z7902 Long term (current) use of antithrombotics/antiplatelets: Secondary | ICD-10-CM | POA: Diagnosis not present

## 2024-06-30 DIAGNOSIS — I130 Hypertensive heart and chronic kidney disease with heart failure and stage 1 through stage 4 chronic kidney disease, or unspecified chronic kidney disease: Secondary | ICD-10-CM | POA: Diagnosis present

## 2024-06-30 DIAGNOSIS — Z951 Presence of aortocoronary bypass graft: Secondary | ICD-10-CM | POA: Diagnosis not present

## 2024-06-30 DIAGNOSIS — N179 Acute kidney failure, unspecified: Secondary | ICD-10-CM | POA: Diagnosis present

## 2024-06-30 DIAGNOSIS — E1136 Type 2 diabetes mellitus with diabetic cataract: Secondary | ICD-10-CM | POA: Diagnosis present

## 2024-06-30 DIAGNOSIS — I480 Paroxysmal atrial fibrillation: Secondary | ICD-10-CM | POA: Diagnosis present

## 2024-06-30 DIAGNOSIS — J9601 Acute respiratory failure with hypoxia: Secondary | ICD-10-CM | POA: Diagnosis present

## 2024-06-30 DIAGNOSIS — I2510 Atherosclerotic heart disease of native coronary artery without angina pectoris: Secondary | ICD-10-CM | POA: Insufficient documentation

## 2024-06-30 DIAGNOSIS — J188 Other pneumonia, unspecified organism: Secondary | ICD-10-CM

## 2024-06-30 DIAGNOSIS — J449 Chronic obstructive pulmonary disease, unspecified: Secondary | ICD-10-CM | POA: Diagnosis present

## 2024-06-30 DIAGNOSIS — I5043 Acute on chronic combined systolic (congestive) and diastolic (congestive) heart failure: Secondary | ICD-10-CM | POA: Diagnosis not present

## 2024-06-30 DIAGNOSIS — E1122 Type 2 diabetes mellitus with diabetic chronic kidney disease: Secondary | ICD-10-CM | POA: Diagnosis present

## 2024-06-30 DIAGNOSIS — Z79899 Other long term (current) drug therapy: Secondary | ICD-10-CM | POA: Diagnosis not present

## 2024-06-30 DIAGNOSIS — E1165 Type 2 diabetes mellitus with hyperglycemia: Secondary | ICD-10-CM | POA: Diagnosis present

## 2024-06-30 DIAGNOSIS — Z794 Long term (current) use of insulin: Secondary | ICD-10-CM | POA: Diagnosis not present

## 2024-06-30 LAB — GLUCOSE, CAPILLARY
Glucose-Capillary: 141 mg/dL — ABNORMAL HIGH (ref 70–99)
Glucose-Capillary: 206 mg/dL — ABNORMAL HIGH (ref 70–99)

## 2024-06-30 LAB — BASIC METABOLIC PANEL WITH GFR
Anion gap: 14 (ref 5–15)
BUN: 29 mg/dL — ABNORMAL HIGH (ref 8–23)
CO2: 22 mmol/L (ref 22–32)
Calcium: 8.3 mg/dL — ABNORMAL LOW (ref 8.9–10.3)
Chloride: 103 mmol/L (ref 98–111)
Creatinine, Ser: 1.48 mg/dL — ABNORMAL HIGH (ref 0.61–1.24)
GFR, Estimated: 51 mL/min — ABNORMAL LOW
Glucose, Bld: 144 mg/dL — ABNORMAL HIGH (ref 70–99)
Potassium: 3.7 mmol/L (ref 3.5–5.1)
Sodium: 138 mmol/L (ref 135–145)

## 2024-06-30 LAB — CBG MONITORING, ED
Glucose-Capillary: 142 mg/dL — ABNORMAL HIGH (ref 70–99)
Glucose-Capillary: 197 mg/dL — ABNORMAL HIGH (ref 70–99)
Glucose-Capillary: 198 mg/dL — ABNORMAL HIGH (ref 70–99)

## 2024-06-30 LAB — CBC
HCT: 29.2 % — ABNORMAL LOW (ref 39.0–52.0)
Hemoglobin: 8.7 g/dL — ABNORMAL LOW (ref 13.0–17.0)
MCH: 26.4 pg (ref 26.0–34.0)
MCHC: 29.8 g/dL — ABNORMAL LOW (ref 30.0–36.0)
MCV: 88.8 fL (ref 80.0–100.0)
Platelets: 109 10*3/uL — ABNORMAL LOW (ref 150–400)
RBC: 3.29 MIL/uL — ABNORMAL LOW (ref 4.22–5.81)
RDW: 17.7 % — ABNORMAL HIGH (ref 11.5–15.5)
WBC: 5.5 10*3/uL (ref 4.0–10.5)
nRBC: 0 % (ref 0.0–0.2)

## 2024-06-30 LAB — HEMOGLOBIN A1C
Hgb A1c MFr Bld: 7.2 % — ABNORMAL HIGH (ref 4.8–5.6)
Mean Plasma Glucose: 159.94 mg/dL

## 2024-06-30 MED ORDER — DIVALPROEX SODIUM 250 MG PO DR TAB
250.0000 mg | DELAYED_RELEASE_TABLET | Freq: Every day | ORAL | Status: DC
  Administered 2024-06-30 – 2024-07-03 (×5): 250 mg via ORAL
  Filled 2024-06-30 (×6): qty 1

## 2024-06-30 MED ORDER — GUAIFENESIN ER 600 MG PO TB12
600.0000 mg | ORAL_TABLET | Freq: Two times a day (BID) | ORAL | Status: DC
  Administered 2024-06-30 – 2024-07-04 (×10): 600 mg via ORAL
  Filled 2024-06-30 (×10): qty 1

## 2024-06-30 MED ORDER — HYDROCOD POLI-CHLORPHE POLI ER 10-8 MG/5ML PO SUER
5.0000 mL | Freq: Two times a day (BID) | ORAL | Status: DC | PRN
  Administered 2024-06-30: 5 mL via ORAL
  Filled 2024-06-30: qty 5

## 2024-06-30 MED ORDER — IPRATROPIUM-ALBUTEROL 0.5-2.5 (3) MG/3ML IN SOLN
3.0000 mL | Freq: Four times a day (QID) | RESPIRATORY_TRACT | Status: DC
  Administered 2024-06-30 (×3): 3 mL via RESPIRATORY_TRACT
  Filled 2024-06-30 (×3): qty 3

## 2024-06-30 MED ORDER — SODIUM CHLORIDE 0.9 % IV SOLN
100.0000 mg | Freq: Two times a day (BID) | INTRAVENOUS | Status: DC
  Administered 2024-06-30: 100 mg via INTRAVENOUS
  Filled 2024-06-30 (×2): qty 100

## 2024-06-30 MED ORDER — POTASSIUM CHLORIDE CRYS ER 20 MEQ PO TBCR
40.0000 meq | EXTENDED_RELEASE_TABLET | Freq: Once | ORAL | Status: AC
  Administered 2024-06-30: 40 meq via ORAL
  Filled 2024-06-30: qty 2

## 2024-06-30 MED ORDER — IPRATROPIUM-ALBUTEROL 0.5-2.5 (3) MG/3ML IN SOLN: 3.0000 mL | RESPIRATORY_TRACT | Status: DC | PRN

## 2024-06-30 MED ORDER — SODIUM CHLORIDE 0.9 % IV SOLN
2.0000 g | INTRAVENOUS | Status: DC
  Administered 2024-06-30: 2 g via INTRAVENOUS
  Filled 2024-06-30: qty 20

## 2024-06-30 MED ORDER — IPRATROPIUM-ALBUTEROL 0.5-2.5 (3) MG/3ML IN SOLN
3.0000 mL | Freq: Three times a day (TID) | RESPIRATORY_TRACT | Status: DC
  Administered 2024-07-01 (×2): 3 mL via RESPIRATORY_TRACT
  Filled 2024-06-30 (×2): qty 3

## 2024-06-30 NOTE — ED Notes (Signed)
 Pt eating breakfast

## 2024-06-30 NOTE — Assessment & Plan Note (Addendum)
-   Will continue Eliquis , amiodarone , and Toprol -XL.

## 2024-06-30 NOTE — Assessment & Plan Note (Signed)
-   Will continue Depakote .

## 2024-06-30 NOTE — Hospital Course (Signed)
 69 y.o. male with medical history significant for coronary artery disease status post four-vessel CABG, CHF, COPD, CVA, type 2 diabetes mellitus, GERD, hypertension and dyslipidemia, seizure disorder and status post TAVR, who presented to the emergency room with acute onset of worsening dyspnea over the last couple of weeks that deteriorated over the last couple of days.    Assessment and Plan:   Acute hypoxic respiratory failure - Currently requiring 2 L nasal cannula to maintain O2 sats in the 90s.  Attempt to wean O2 as possible.   Acute exacerbation of HFrEF - Worsening orthopnea, DOE, dyspnea prior to admission.  proBNP greater than 20,000.  Chest x-ray personally reviewed noting congestion.  Appears to be responding well to IV Lasix  40 mg twice daily.  Diuresed greater than 5 L since yesterday.  Starting to appear intravascularly dry.  Will transition to p.o. torsemide , discontinue IV diuretic.  Continue home cardiac regimen, GDMT with Jardiance , metoprolol , spironolactone .  Holding ARB secondary to acute kidney injury.   Concern for multifocal pneumonia - CT imaging showing patchy opacities, edema versus pneumonia.  Patient not complaining of purulent sputum, fever.  No leukocytosis.  Was given empiric antibiotics.  Will discontinue antibiotics at this time.  Monitor respiratory function.   Acute kidney injury - Likely multifactorial etiology.  Appears intravascularly dry now.  Discontinued IV diuretics.  Will recheck BMP in AM.   Paroxysmal atrial fibrillation - Eliquis  and amiodarone  on board.  Continues on telemetry.   Coronary artery disease - Continue Plavix , statin.   Diabetes mellitus - Insulin  sliding scale on board.  Insulin  glargine 10 units daily.   Hypothyroidism - Synthroid  on board.   Seizure disorder - Resume Depakote .   Physical debilitation muscle weakness - Patient appears to have concerns about living alone at home.  Concerned about remembering to take his  medications.  Pending PT/OT.  Will work with patient, family, TOC on disposition planning.

## 2024-06-30 NOTE — Assessment & Plan Note (Signed)
-   The patient will be placed on supplemental coverage with NovoLog . - Will continue basal coverage. - Will continue Jardiance .

## 2024-06-30 NOTE — Progress Notes (Signed)
 " Progress Note   Patient: [facility] FMW:[id number] DOB: 22-Sep-1955 DOA: 06/29/2024  DOS: the patient was seen and examined on 06/30/2024   Brief hospital course:  69 y.o. male with medical history significant for coronary artery disease status post four-vessel CABG, CHF, COPD, CVA, type 2 diabetes mellitus, GERD, hypertension and dyslipidemia, seizure disorder and status post TAVR, who presented to the emergency room with acute onset of worsening dyspnea over the last couple of weeks that deteriorated over the last couple of days.   Assessment and Plan:  Acute hypoxic respiratory failure - Currently requiring 2 L nasal cannula to maintain O2 sats in the 90s.  Attempt to wean O2 as possible.  Acute exacerbation of HFrEF - Worsening orthopnea, DOE, dyspnea prior to admission.  proBNP greater than 20,000.  Chest x-ray personally reviewed noting congestion.  Appears to be responding well to IV Lasix  40 mg twice daily.  Continue home cardiac regimen, GDMT with Jardiance , metoprolol , spironolactone .  Concern for multifocal pneumonia - CT imaging showing patchy opacities, edema versus pneumonia.  Patient not complaining of purulent sputum, fever.  No leukocytosis.  Was given empiric antibiotics.  Will discontinue antibiotics at this time.  Monitor respiratory function.  Paroxysmal atrial fibrillation - Eliquis  and amiodarone  on board.  Continues on telemetry.  Coronary artery disease - Continue Plavix , statin.  Diabetes mellitus - Insulin  sliding scale on board.  Insulin  glargine 10 units daily.  Hypothyroidism - Synthroid  on board.  Seizure disorder - Resume Depakote .  Physical debilitation muscle weakness - Patient appears to have concerns about living alone at home.  Has been feeling short of breath and weak more lately.  Concerned about remembering to take his medications.  Will order PT/OT.  Will work with patient, family, TOC on disposition planning.   Subjective: Patient  resting comfortably's morning.  States he feels a bit better, nasal cannula in place.  Responding well to diuresis.  Seems very concerned about going home and being alone.  Does not know if he can take care of himself or remember to take his medications.  Denies any fever, worsening shortness of breath, cough, purulent sputum, chest pain, nausea, vomiting, abdominal pain.  Physical Exam:  Vitals:   06/30/24 0329 06/30/24 0543 06/30/24 0710 06/30/24 0930  BP:  132/65  118/77  Pulse:  63  78  Resp:  18  (!) 24  Temp: 97.6 F (36.4 C) 97.8 F (36.6 C)  98.2 F (36.8 C)  TempSrc: Oral Oral  Oral  SpO2:  96% 98% 95%  Weight:      Height:        GENERAL:  Alert, pleasant, no acute distress  HEENT:  EOMI, nasal cannula CARDIOVASCULAR: Irregularly irregular RESPIRATORY: Poor air movement bilaterally GASTROINTESTINAL:  Soft, nontender, nondistended EXTREMITIES:  No LE edema bilaterally NEURO:  No new focal deficits appreciated SKIN:  No rashes noted PSYCH:  Appropriate mood and affect, anxious    Data Reviewed:  Imaging Studies: CT Angio Chest PE W/Cm &/Or Wo Cm Result Date: 06/29/2024 EXAM: CTA of the Chest with contrast for PE 06/29/2024 09:33:08 PM TECHNIQUE: CTA of the chest was performed after the administration of 75 mL of iohexol  (OMNIPAQUE ) 350 MG/ML injection. Multiplanar reformatted images are provided for review. MIP images are provided for review. Automated exposure control, iterative reconstruction, and/or weight based adjustment of the mA/kV was utilized to reduce the radiation dose to as low as reasonably achievable. COMPARISON: Chest radiograph 06/29/2024 and CT chest 01/17/2024. CLINICAL HISTORY: Pulmonary  embolism (PE) suspected, high prob. Shortness of breath and chest pain. Cough for 1.5 weeks. FINDINGS: PULMONARY ARTERIES: Pulmonary arteries are adequately opacified for evaluation. No pulmonary embolism. Main pulmonary artery is normal in caliber. MEDIASTINUM: Cardiac  enlargement. Aortic valve replacement. Coronary artery bypass. No pericardial effusions. Calcification of the aorta without aneurysm. The thyroid  gland is unremarkable. Esophagus is decompressed. Postoperative changes with median sternotomy. LYMPH NODES: Prominent mediastinal lymph nodes with pretracheal lymph nodes measuring up to about 1.3 cm diameter. Lymphadenopathy is similar to the prior study, most likely reactive. No axillary lymphadenopathy. LUNGS AND PLEURA: Moderate bilateral pleural effusions. Atelectasis or consolidation in the lung bases. Peribronchial thickening with mucous plugging. Patchy ground glass infiltrates throughout the lungs with a mosaic pattern likely representing edema or possibly multifocal pneumonia. No pneumothorax. UPPER ABDOMEN: Intrarenal stone on the left measuring 6 mm in diameter. No hydronephrosis. SOFT TISSUES AND BONES: Degenerative changes in the spine. No acute bony abnormalities. No acute soft tissue abnormality. IMPRESSION: 1. No evidence of pulmonary embolism. 2. Moderate bilateral pleural effusions. 3. Patchy ground glass infiltrates throughout the lungs with a mosaic pattern, likely representing edema or possibly multifocal pneumonia. 4. Bibasilar atelectasis or consolidation. 5. Peribronchial thickening with mucous plugging, which can be seen with bronchitis. Electronically signed by: Elsie Gravely MD 06/29/2024 09:40 PM EST RP Workstation: [id number]   DG Chest 2 View Result Date: 06/29/2024 EXAM: 2 VIEW(S) XRAY OF THE CHEST 06/29/2024 06:58:00 PM COMPARISON: 05/19/2024 CLINICAL HISTORY: Shortness of breath. FINDINGS: LUNGS AND PLEURA: Mild pulmonary edema. Patchy airspace opacities in lung bases. Small bilateral pleural effusions. No pneumothorax. HEART AND MEDIASTINUM: Cardiomegaly. TAVR stent in place. CABG markers and sternotomy wires noted. Cardiac loop recorder device in place. BONES AND SOFT TISSUES: No acute osseous abnormality. IMPRESSION: 1. Mild  pulmonary edema with patchy bibasilar airspace opacities. 2. Small bilateral pleural effusions. 3. Cardiomegaly with TAVR stent, postoperative CABG changes, and cardiac loop recorder. Electronically signed by: Elsie Gravely MD 06/29/2024 07:01 PM EST RP Workstation: [id number]   CUP PACEART REMOTE DEVICE CHECK Result Date: 06/24/2024 ILR summary report received. Battery status OK. Normal device function. No new symptom, tachy, brady, or pause episodes. No new AF episodes. Monthly summary reports and ROV/PRN [facility] CVRS   There are no new results to review at this time.  Previous records (including but not limited to H&P, progress notes, nursing notes, TOC management) were reviewed in assessment of this patient.  Labs: CBC: Recent Labs  Lab 06/29/24 1934 06/30/24 0330  WBC 7.3 5.5  NEUTROABS 6.1  --   HGB 9.4* 8.7*  HCT 31.8* 29.2*  MCV 88.8 88.8  PLT 154 109*   Basic Metabolic Panel: Recent Labs  Lab 06/29/24 1934 06/30/24 0330  NA 135 138  K 4.1 3.7  CL 99 103  CO2 20* 22  GLUCOSE 285* 144*  BUN 31* 29*  CREATININE 1.51* 1.48*  CALCIUM  8.4* 8.3*   Liver Function Tests: No results for input(s): AST, ALT, ALKPHOS, BILITOT, PROT, ALBUMIN in the last 168 hours. CBG: Recent Labs  Lab 06/30/24 0036 06/30/24 0812  GLUCAP 197* 142*    Scheduled Meds:  amiodarone   200 mg Oral Daily   apixaban   5 mg Oral BID   atorvastatin   80 mg Oral QHS   calcium  citrate  1 tablet Oral BID   clopidogrel   75 mg Oral Daily   cyanocobalamin   1,000 mcg Oral Daily   cyclopentolate   1 drop Left Eye BID   diclofenac  Sodium  4 g  Topical QID   divalproex   250 mg Oral QHS   empagliflozin   25 mg Oral Daily   furosemide   40 mg Intravenous Q12H   guaiFENesin   600 mg Oral BID   insulin  aspart  0-15 Units Subcutaneous TID WC   insulin  aspart  0-5 Units Subcutaneous QHS   insulin  glargine-yfgn  10 Units Subcutaneous Daily   ipratropium-albuterol   3 mL Nebulization QID   isosorbide   mononitrate  15 mg Oral Daily   levothyroxine   50 mcg Oral Q0600   metoprolol  succinate  12.5 mg Oral Daily   multivitamin with minerals  1 tablet Oral Daily   spironolactone   25 mg Oral Daily   Continuous Infusions:  cefTRIAXone  (ROCEPHIN )  IV Stopped (06/30/24 0903)   doxycycline  (VIBRAMYCIN ) IV Stopped (06/30/24 0312)   PRN Meds:.acetaminophen  **OR** acetaminophen , chlorpheniramine-HYDROcodone , ipratropium-albuterol , magnesium  hydroxide, nitroGLYCERIN , traZODone   Family Communication: None at bedside  Disposition: Status is: Inpatient Remains inpatient appropriate because: Acute HFrEF     Time spent: 38 minutes  Length of inpatient stay: 0 days  Author: Carliss LELON Canales, DO 06/30/2024 12:22 PM  For on call review [email or web address].   "

## 2024-06-30 NOTE — Assessment & Plan Note (Signed)
-   Will continue beta-blocker therapy, Imdur , Plavix  and statin therapy.

## 2024-06-30 NOTE — ED Notes (Signed)
 Messaged pharmacy about pt's missing medications

## 2024-06-30 NOTE — Consult Note (Signed)
 [facility]  CARDIOLOGY CONSULT NOTE  Patient ID: Thomas [facility] MRN: [id number] DOB/AGE: [birth year] 69 y.o.  Admit date: 06/29/2024 Referring Physician Dr. Arlon Reason for Consultation acute heart failure  HPI: 69 year old male on whom our service is consulted for acute heart failure.  Past medical history significant for CAD with prior CABG 1997, chronic heart failure with reduced EF, paroxysmal atrial fibrillation, aortic stenosis status post TAVR, hypertension, hyperlipidemia, diabetes, history of stroke, CKD who presented with worsening shortness of breath.  Last heart cath 05/2024 for NSTEMI with arm discomfort showed significant triple-vessel disease with patent LIMA to LAD, SVG to circumflex diffusely diseased, known occluded SVG to RCA-medically managed.  Who presented with worsening shortness of breath over last 1 to 2 weeks, along with pedal edema and some orthopnea.  No chest or arm discomfort.  EKG showed sinus rhythm with diffuse T wave inversion, unchanged from before.  Troponin minimal.  proBNP significantly elevated at 20,000.  Chest x-ray with mild pulmonary edema and small bilateral pleural effusion.  CTA with no PE, findings concerning for pulmonary edema.  Of note patient lives in an apartment by himself, has vision issues as well-he is not sure if he is taking medication correctly.  He is also concerned to live by himself.  Review of systems complete and found to be negative unless listed above     Past Medical History:  Diagnosis Date   CAD (coronary artery disease) of artery bypass graft    s/p CABG x 4 in 1997   CHF (congestive heart failure) (HCC)    COPD (chronic obstructive pulmonary disease) (HCC)    Coronary artery disease    CVA (cerebral vascular accident) (HCC)    Diabetes mellitus without complication (HCC)    Dysrhythmia    GERD (gastroesophageal reflux disease)    HLD (hyperlipidemia)    Hypertension    S/P TAVR (transcatheter aortic valve  replacement) 07/05/2021   with Edwards 26mm S3UR via TF approach with Dr. Wonda and Dr. Lucas   Seizures [facility])     Past Surgical History:  Procedure Laterality Date   BYPASS GRAFT ANGIOGRAPHY N/A 04/25/2021   Procedure: BYPASS GRAFT ANGIOGRAPHY;  Surgeon: Lawyer Bernardino Cough, MD;  Location: [facility] INVAS[facility];  Service: Cardiovascular;  Laterality: N/A;   CARDIAC SURGERY     CORNEAL TRANSPLANT Right    CORONARY ARTERY BYPASS GRAFT  1997   ELECTROPHYSIOLOGY STUDY N/A 09/08/2021   Procedure: ELECTROPHYSIOLOGY STUDY;  Surgeon: Cindie Ole DASEN, MD;  Location: [facility] INVASIVE CV LAB;  Service: Cardiovascular;  Laterality: N/A;   EYE SURGERY     FLEXOR TENDON REPAIR Left 07/11/2019   Procedure: FLEXOR tenolysis  REPAIR LEFT RING FINGER with tednon repair;  Surgeon: Kathlynn Sharper, MD;  Location: [facility] ORS;  Service: Orthopedics;  Laterality: Left;   INCISION AND DRAINAGE ABSCESS Left 05/08/2019   Procedure: INCISION AND DRAINAGE ABSCESS;  Surgeon: Mardee Lynwood SQUIBB, MD;  Location: [facility] ORS;  Service: Orthopedics;  Laterality: Left;   INTRAOPERATIVE TRANSTHORACIC ECHOCARDIOGRAM N/A 07/05/2021   Procedure: INTRAOPERATIVE TRANSTHORACIC ECHOCARDIOGRAM;  Surgeon: Wonda Sharper, MD;  Location: [facility];  Service: Open Heart Surgery;  Laterality: N/A;   LEFT HEART CATH AND CORONARY ANGIOGRAPHY Left 06/09/2022   Procedure: LEFT HEART CATH AND CORONARY ANGIOGRAPHY;  Surgeon: Fernand Denyse LABOR, MD;  Location: [facility] INVASIVE CV LAB;  Service: Cardiovascular;  Laterality: Left;   LEFT HEART CATH AND CORONARY ANGIOGRAPHY Left 09/27/2023   Procedure: LEFT HEART CATH AND CORONARY ANGIOGRAPHY with possible  coronary intervention;  Surgeon: Fernand Denyse LABOR, MD;  Location: [facility] INVASIVE CV LAB;  Service: Cardiovascular;  Laterality: Left;   LEFT HEART CATH AND CORS/GRAFTS ANGIOGRAPHY N/A 10/02/2019   Procedure: LEFT HEART CATH AND CORS/GRAFTS ANGIOGRAPHY;  Surgeon: Anner Alm ORN, MD;  Location: [facility] INVASIVE CV LAB;  Service:  Cardiovascular;  Laterality: N/A;   LEFT HEART CATH AND CORS/GRAFTS ANGIOGRAPHY N/A 07/17/2022   Procedure: LEFT HEART CATH AND CORS/GRAFTS ANGIOGRAPHY and possible PCI and stent;  Surgeon: Fernand Denyse LABOR, MD;  Location: [facility] INVASIVE CV LAB;  Service: Cardiovascular;  Laterality: N/A;   LEFT HEART CATH AND CORS/GRAFTS ANGIOGRAPHY N/A 05/05/2024   Procedure: LEFT HEART CATH AND CORS/GRAFTS ANGIOGRAPHY;  Surgeon: Florencio Cara BIRCH, MD;  Location: [facility] INVASIVE CV LAB;  Service: Cardiovascular;  Laterality: N/A;   LOOP RECORDER INSERTION N/A 10/06/2019   Procedure: LOOP RECORDER INSERTION;  Surgeon: Waddell Danelle ORN, MD;  Location: [facility] INVASIVE CV LAB;  Service: Cardiovascular;  Laterality: N/A;   TRANSCATHETER AORTIC VALVE REPLACEMENT, TRANSFEMORAL N/A 07/05/2021   Procedure: TRANSCATHETER AORTIC VALVE REPLACEMENT, TRANSFEMORAL;  Surgeon: Wonda Sharper, MD;  Location: [facility] OR;  Service: Open Heart Surgery;  Laterality: N/A;   TRIGGER FINGER RELEASE Left     (Not in a [facility] admission)     Family History  Problem Relation Age of Onset   Seizures Brother       Review of systems complete and found to be negative unless listed above      PHYSICAL EXAM  Alert and oriented x 3 +1 pedal edema Elevated JVD No wheeze  Labs:   Lab Results  Component Value Date   WBC 5.5 06/30/2024   HGB 8.7 (L) 06/30/2024   HCT 29.2 (L) 06/30/2024   MCV 88.8 06/30/2024   PLT 109 (L) 06/30/2024    Recent Labs  Lab 06/30/24 0330  NA 138  K 3.7  CL 103  CO2 22  BUN 29*  CREATININE 1.48*  CALCIUM  8.3*  GLUCOSE 144*   Lab Results  Component Value Date   TROPONINI <0.03 08/08/2017    Lab Results  Component Value Date   CHOL 157 05/05/2024   CHOL 132 03/20/2024   CHOL 129 01/17/2024   Lab Results  Component Value Date   HDL 37 (L) 05/05/2024   HDL 37 (L) 03/20/2024   HDL 37 (L) 01/17/2024   Lab Results  Component Value Date   LDLCALC 104 (H) 05/05/2024   LDLCALC 80 03/20/2024    LDLCALC 79 01/17/2024   Lab Results  Component Value Date   TRIG 83 05/05/2024   TRIG 76 03/20/2024   TRIG 65 01/17/2024   Lab Results  Component Value Date   CHOLHDL 4.3 05/05/2024   CHOLHDL 3.6 03/20/2024   CHOLHDL 3.5 01/17/2024   No results found for: LDLDIRECT    Radiology: CT Angio Chest PE W/Cm &/Or Wo Cm Result Date: 06/29/2024 EXAM: CTA of the Chest with contrast for PE 06/29/2024 09:33:08 PM TECHNIQUE: CTA of the chest was performed after the administration of 75 mL of iohexol  (OMNIPAQUE ) 350 MG/ML injection. Multiplanar reformatted images are provided for review. MIP images are provided for review. Automated exposure control, iterative reconstruction, and/or weight based adjustment of the mA/kV was utilized to reduce the radiation dose to as low as reasonably achievable. COMPARISON: Chest radiograph 06/29/2024 and CT chest 01/17/2024. CLINICAL HISTORY: Pulmonary embolism (PE) suspected, high prob. Shortness of breath and chest pain. Cough for 1.5 weeks. FINDINGS: PULMONARY ARTERIES: Pulmonary arteries are adequately  opacified for evaluation. No pulmonary embolism. Main pulmonary artery is normal in caliber. MEDIASTINUM: Cardiac enlargement. Aortic valve replacement. Coronary artery bypass. No pericardial effusions. Calcification of the aorta without aneurysm. The thyroid  gland is unremarkable. Esophagus is decompressed. Postoperative changes with median sternotomy. LYMPH NODES: Prominent mediastinal lymph nodes with pretracheal lymph nodes measuring up to about 1.3 cm diameter. Lymphadenopathy is similar to the prior study, most likely reactive. No axillary lymphadenopathy. LUNGS AND PLEURA: Moderate bilateral pleural effusions. Atelectasis or consolidation in the lung bases. Peribronchial thickening with mucous plugging. Patchy ground glass infiltrates throughout the lungs with a mosaic pattern likely representing edema or possibly multifocal pneumonia. No pneumothorax. UPPER ABDOMEN:  Intrarenal stone on the left measuring 6 mm in diameter. No hydronephrosis. SOFT TISSUES AND BONES: Degenerative changes in the spine. No acute bony abnormalities. No acute soft tissue abnormality. IMPRESSION: 1. No evidence of pulmonary embolism. 2. Moderate bilateral pleural effusions. 3. Patchy ground glass infiltrates throughout the lungs with a mosaic pattern, likely representing edema or possibly multifocal pneumonia. 4. Bibasilar atelectasis or consolidation. 5. Peribronchial thickening with mucous plugging, which can be seen with bronchitis. Electronically signed by: Elsie Gravely MD 06/29/2024 09:40 PM EST RP Workstation: [id number]   DG Chest 2 View Result Date: 06/29/2024 EXAM: 2 VIEW(S) XRAY OF THE CHEST 06/29/2024 06:58:00 PM COMPARISON: 05/19/2024 CLINICAL HISTORY: Shortness of breath. FINDINGS: LUNGS AND PLEURA: Mild pulmonary edema. Patchy airspace opacities in lung bases. Small bilateral pleural effusions. No pneumothorax. HEART AND MEDIASTINUM: Cardiomegaly. TAVR stent in place. CABG markers and sternotomy wires noted. Cardiac loop recorder device in place. BONES AND SOFT TISSUES: No acute osseous abnormality. IMPRESSION: 1. Mild pulmonary edema with patchy bibasilar airspace opacities. 2. Small bilateral pleural effusions. 3. Cardiomegaly with TAVR stent, postoperative CABG changes, and cardiac loop recorder. Electronically signed by: Elsie Gravely MD 06/29/2024 07:01 PM EST RP Workstation: [id number]   CUP PACEART REMOTE DEVICE CHECK Result Date: 06/24/2024 ILR summary report received. Battery status OK. Normal device function. No new symptom, tachy, brady, or pause episodes. No new AF episodes. Monthly summary reports and ROV/PRN [facility] CVRS   EKG: Sinus rhythm  ASSESSMENT AND PLAN:  Acute on chronic heart failure with reduced EF CAD with prior CABG Paroxysmal atrial fibrillation Status post TAVR Hypertension Hyperlipidemia  IV diuresis with Lasix .  Keep potassium greater than  4 and magnesium  greater than 2. Continue goal-directed medical therapy with metoprolol , spironolactone , Jardiance .  Further adjustment as blood pressure allows. Continue Eliquis  and antiplatelet therapy with recent NSTEMI He has vision issues, lives by himself-not sure if he is taking medications appropriately which might be contributing to his heart failure as well.  He is afraid to live by himself, need to assess for placement at discharge  Signed: Keller JAYSON Paterson MD 06/30/2024, 1:37 PM

## 2024-06-30 NOTE — Plan of Care (Signed)
   Problem: Clinical Measurements: Goal: Respiratory complications will improve Outcome: Progressing

## 2024-06-30 NOTE — Progress Notes (Signed)
 Heart Failure Navigator Progress Note  Assessed for Heart & Vascular TOC clinic readiness.  Patient does not meet criteria due to current [facility]iology patient.   Navigator will sign off at this time.  Roxy Horseman, RN, BSN [facility] Heart Failure Navigator Secure Chat Only

## 2024-06-30 NOTE — Assessment & Plan Note (Signed)
-   Will continue Synthroid .

## 2024-06-30 NOTE — Assessment & Plan Note (Addendum)
-   Will continue antibiotic therapy with IV Rocephin  and doxycycline . - Mucolytic therapy be provided as well as duo nebs q.i.d. and q.4 hours p.r.n. - We will follow blood cultures.

## 2024-06-30 NOTE — Assessment & Plan Note (Signed)
 Will continue statin therapy

## 2024-07-01 ENCOUNTER — Telehealth: Payer: Self-pay | Admitting: Pharmacist

## 2024-07-01 LAB — CBC
HCT: 32.5 % — ABNORMAL LOW (ref 39.0–52.0)
Hemoglobin: 9.6 g/dL — ABNORMAL LOW (ref 13.0–17.0)
MCH: 26.1 pg (ref 26.0–34.0)
MCHC: 29.5 g/dL — ABNORMAL LOW (ref 30.0–36.0)
MCV: 88.3 fL (ref 80.0–100.0)
Platelets: 134 10*3/uL — ABNORMAL LOW (ref 150–400)
RBC: 3.68 MIL/uL — ABNORMAL LOW (ref 4.22–5.81)
RDW: 17.3 % — ABNORMAL HIGH (ref 11.5–15.5)
WBC: 4.3 10*3/uL (ref 4.0–10.5)
nRBC: 0 % (ref 0.0–0.2)

## 2024-07-01 LAB — BASIC METABOLIC PANEL WITH GFR
Anion gap: 11 (ref 5–15)
BUN: 32 mg/dL — ABNORMAL HIGH (ref 8–23)
CO2: 30 mmol/L (ref 22–32)
Calcium: 9.1 mg/dL (ref 8.9–10.3)
Chloride: 102 mmol/L (ref 98–111)
Creatinine, Ser: 1.82 mg/dL — ABNORMAL HIGH (ref 0.61–1.24)
GFR, Estimated: 40 mL/min — ABNORMAL LOW
Glucose, Bld: 111 mg/dL — ABNORMAL HIGH (ref 70–99)
Potassium: 4.4 mmol/L (ref 3.5–5.1)
Sodium: 142 mmol/L (ref 135–145)

## 2024-07-01 LAB — GLUCOSE, CAPILLARY
Glucose-Capillary: 119 mg/dL — ABNORMAL HIGH (ref 70–99)
Glucose-Capillary: 255 mg/dL — ABNORMAL HIGH (ref 70–99)
Glucose-Capillary: 118 mg/dL — ABNORMAL HIGH (ref 70–99)
Glucose-Capillary: 268 mg/dL — ABNORMAL HIGH (ref 70–99)

## 2024-07-01 LAB — MAGNESIUM: Magnesium: 2.1 mg/dL (ref 1.7–2.4)

## 2024-07-01 MED ORDER — TORSEMIDE 20 MG PO TABS
20.0000 mg | ORAL_TABLET | Freq: Every day | ORAL | Status: DC
  Administered 2024-07-01 – 2024-07-03 (×3): 20 mg via ORAL
  Filled 2024-07-01 (×3): qty 1

## 2024-07-01 NOTE — Progress Notes (Addendum)
 [facility] CLINIC CARDIOLOGY PROGRESS NOTE   Patient ID: Thomas Moore MRN: [id number] DOB/AGE: 69-Sep-1957 69 y.o.  Admit date: 06/29/2024 Referring Physician Dr. Arlon Primary Physician Lenon Layman ORN, MD  Primary Cardiologist Dr. Fernand Reason for Consultation AoCHF  HPI: Thomas Moore is a 69 y.o. male with a past medical history of coronary artery disease s/p CABG (1997), chronic HFrEF (30-35% - 08/2023), paroxysmal atrial fibrillation (on Eliquis ), hypertension, hyperlipidemia, diabetes, history stroke, history of SVT, CKD stage IIIa, aortic stenosis s/p TAVR, history of thrombocytopenia, bilateral carotid artery stenosis, s/p loop recorder implant who presented to the ED on 06/29/2024 for SOB. Cardiology was consulted for further evaluation.   Interval History:  -Patient seen and examined this AM, sitting upright in bedside chair after working with OT.  -BP and HR stable, great UOP.  -No events on tele.  Review of systems complete and found to be negative unless listed above   Vitals:   07/01/24 0357 07/01/24 0456 07/01/24 0626 07/01/24 0744  BP: (!) 113/54   (!) 147/67  Pulse: 64   68  Resp: 19   17  Temp: 97.8 F (36.6 C)   (!) 97.5 F (36.4 C)  TempSrc: Oral     SpO2: 100%  100% 97%  Weight:  61.5 kg    Height:         Intake/Output Summary (Last 24 hours) at 07/01/2024 1120 Last data filed at 07/01/2024 0744 Gross per 24 hour  Intake 120 ml  Output 3575 ml  Net -3455 ml     PHYSICAL EXAM General: Chronically ill appearing male, well nourished, in no acute distress. HEENT: Normocephalic and atraumatic. Neck: No JVD.  Lungs: Normal respiratory effort on room air. Clear bilaterally to auscultation. No wheezes, crackles, rhonchi.  Heart: HRRR. Normal S1 and S2 without gallops or murmurs. Radial & DP pulses 2+ bilaterally. Abdomen: Non-distended appearing.  Msk: Normal strength and tone for age. Extremities: No clubbing, cyanosis or edema.   Neuro: Alert and  oriented X 3. Psych: Mood appropriate, affect congruent.    LABS: Basic Metabolic Panel: Recent Labs    06/30/24 0330 07/01/24 0300  NA 138 142  K 3.7 4.4  CL 103 102  CO2 22 30  GLUCOSE 144* 111*  BUN 29* 32*  CREATININE 1.48* 1.82*  CALCIUM  8.3* 9.1  MG  --  2.1   Liver Function Tests: No results for input(s): AST, ALT, ALKPHOS, BILITOT, PROT, ALBUMIN in the last 72 hours. No results for input(s): LIPASE, AMYLASE in the last 72 hours. CBC: Recent Labs    06/29/24 1934 06/30/24 0330 07/01/24 0300  WBC 7.3 5.5 4.3  NEUTROABS 6.1  --   --   HGB 9.4* 8.7* 9.6*  HCT 31.8* 29.2* 32.5*  MCV 88.8 88.8 88.3  PLT 154 109* 134*   Cardiac Enzymes: No results for input(s): CKTOTAL, CKMB, CKMBINDEX, TROPONINIHS in the last 72 hours. BNP: No results for input(s): BNP in the last 72 hours. D-Dimer: No results for input(s): DDIMER in the last 72 hours. Hemoglobin A1C: Recent Labs    06/30/24 0330  HGBA1C 7.2*   Fasting Lipid Panel: No results for input(s): CHOL, HDL, LDLCALC, TRIG, CHOLHDL, LDLDIRECT in the last 72 hours. Thyroid  Function Tests: No results for input(s): TSH, T4TOTAL, T3FREE, THYROIDAB in the last 72 hours.  Invalid input(s): FREET3 Anemia Panel: No results for input(s): VITAMINB12, FOLATE, FERRITIN, TIBC, IRON, RETICCTPCT in the last 72 hours.  CT Angio Chest PE W/Cm &/Or Wo Cm  Result Date: 06/29/2024 EXAM: CTA of the Chest with contrast for PE 06/29/2024 09:33:08 PM TECHNIQUE: CTA of the chest was performed after the administration of 75 mL of iohexol  (OMNIPAQUE ) 350 MG/ML injection. Multiplanar reformatted images are provided for review. MIP images are provided for review. Automated exposure control, iterative reconstruction, and/or weight based adjustment of the mA/kV was utilized to reduce the radiation dose to as low as reasonably achievable. COMPARISON: Chest radiograph 06/29/2024 and CT  chest 01/17/2024. CLINICAL HISTORY: Pulmonary embolism (PE) suspected, high prob. Shortness of breath and chest pain. Cough for 1.5 weeks. FINDINGS: PULMONARY ARTERIES: Pulmonary arteries are adequately opacified for evaluation. No pulmonary embolism. Main pulmonary artery is normal in caliber. MEDIASTINUM: Cardiac enlargement. Aortic valve replacement. Coronary artery bypass. No pericardial effusions. Calcification of the aorta without aneurysm. The thyroid  gland is unremarkable. Esophagus is decompressed. Postoperative changes with median sternotomy. LYMPH NODES: Prominent mediastinal lymph nodes with pretracheal lymph nodes measuring up to about 1.3 cm diameter. Lymphadenopathy is similar to the prior study, most likely reactive. No axillary lymphadenopathy. LUNGS AND PLEURA: Moderate bilateral pleural effusions. Atelectasis or consolidation in the lung bases. Peribronchial thickening with mucous plugging. Patchy ground glass infiltrates throughout the lungs with a mosaic pattern likely representing edema or possibly multifocal pneumonia. No pneumothorax. UPPER ABDOMEN: Intrarenal stone on the left measuring 6 mm in diameter. No hydronephrosis. SOFT TISSUES AND BONES: Degenerative changes in the spine. No acute bony abnormalities. No acute soft tissue abnormality. IMPRESSION: 1. No evidence of pulmonary embolism. 2. Moderate bilateral pleural effusions. 3. Patchy ground glass infiltrates throughout the lungs with a mosaic pattern, likely representing edema or possibly multifocal pneumonia. 4. Bibasilar atelectasis or consolidation. 5. Peribronchial thickening with mucous plugging, which can be seen with bronchitis. Electronically signed by: Elsie Gravely MD 06/29/2024 09:40 PM EST RP Workstation: [id number]   DG Chest 2 View Result Date: 06/29/2024 EXAM: 2 VIEW(S) XRAY OF THE CHEST 06/29/2024 06:58:00 PM COMPARISON: 05/19/2024 CLINICAL HISTORY: Shortness of breath. FINDINGS: LUNGS AND PLEURA: Mild pulmonary  edema. Patchy airspace opacities in lung bases. Small bilateral pleural effusions. No pneumothorax. HEART AND MEDIASTINUM: Cardiomegaly. TAVR stent in place. CABG markers and sternotomy wires noted. Cardiac loop recorder device in place. BONES AND SOFT TISSUES: No acute osseous abnormality. IMPRESSION: 1. Mild pulmonary edema with patchy bibasilar airspace opacities. 2. Small bilateral pleural effusions. 3. Cardiomegaly with TAVR stent, postoperative CABG changes, and cardiac loop recorder. Electronically signed by: Elsie Gravely MD 06/29/2024 07:01 PM EST RP Workstation: [id number]     ECHO 05/2024: 1. Left ventricular ejection fraction, by estimation, is 35 to 40%. The left ventricle has moderately decreased function. The left ventricle demonstrates regional wall motion abnormalities (see scoring diagram/findings for description). The left ventricular internal cavity size was mildly dilated. There is mild concentric left ventricular hypertrophy. Left ventricular diastolic  parameters are consistent with Grade III diastolic dysfunction (restrictive).   2. Right ventricular systolic function is mildly reduced. The right ventricular size is mildly enlarged. Mildly increased right ventricular wall thickness.   3. The mitral valve is normal in structure. No evidence of mitral valve  regurgitation.   4. The aortic valve is normal in structure. Aortic valve regurgitation is  not visualized. Aortic valve sclerosis is present, with no evidence of  aortic valve stenosis.    TELEMETRY (personally reviewed): sinus rhythm rate 70s  EKG (personally reviewed): NSR rate 74 bpm  DATA reviewed by me 07/01/24: last 24h vitals tele labs imaging I/O, hospitalist progress note  Principal Problem:  Acute on chronic combined systolic (congestive) and diastolic (congestive) heart failure (HCC) Active Problems:   Paroxysmal atrial fibrillation (HCC)   Hypothyroidism   Dyslipidemia   Type 2 diabetes mellitus  without complications (HCC)   Coronary artery disease   Seizure disorder (HCC)   Multifocal pneumonia    ASSESSMENT AND PLAN: Thomas Moore is a 69 y.o. male with a past medical history of coronary artery disease s/p CABG (1997), chronic HFrEF (30-35% - 08/2023), paroxysmal atrial fibrillation (on Eliquis ), hypertension, hyperlipidemia, diabetes, history stroke, history of SVT, CKD stage IIIa, aortic stenosis s/p TAVR, history of thrombocytopenia, bilateral carotid artery stenosis, s/p loop recorder implant who presented to the ED on 06/29/2024 for SOB. Cardiology was consulted for further evaluation.   # Acute on chronic HFrEF # Coronary artery disease s/p CABG # Paroxysmal atrial fibrillation # S/p TAVR Patient presented with worsening SOB/DOE for a few weeks. BNP elevated at 20,181. CXR with mild pulmonary edema. Started on IV lasix , net negative 5.8L today.  -Transitioned to PO torsemide  20 mg daily.  -Continue jardiance  25 mg daily and spironolactone  25 mg daily. Consider addition of ARB. -Continue eliquis  5 mg twice daily.  -Continue amiodarone  200 mg daily.  -Continue atorvastatin  80 mg daily and plavix  75 mg daily.  -Continue imdur  15 mg daily and metoprolol  succinate 12.5 mg daily.  -Patient continues to express concern that he is unable to care for himself at home but likely does not qualify for SNF, would likely benefit from ALF.   This patient's case was discussed and created with Dr. Wilburn and he is in agreement.  Signed:  Danita Bloch, PA-C  07/01/2024, 11:20 AM [facility] Cardiology

## 2024-07-01 NOTE — Telephone Encounter (Signed)
 Heart Failure Stewardship Pharmacy Note   PCP: Lenon Layman ORN, MD PCP-Cardiologist: Denyse Bathe, MD   HPI: Thomas Moore is a 69 y.o. male with medical history significant for coronary artery disease status post four-vessel CABG, CHF, COPD, CVA, type 2 diabetes mellitus, GERD, hypertension and dyslipidemia, seizure disorder and status post TAVR, who presented to the emergency room with acute onset of worsening dyspnea over the last couple of weeks that deteriorated over the last couple of days.  He admitted to worsening lower extremity edema as well as dyspnea on exertion and orthopnea.    On admission, proBNP was 79818, HS-troponin was 36, K 4.1, Scr 1.51, CO2 20, and Hgb 9.4. Chest x-ray noted mild pulmonary edema and small bilateral pleural effusion. CTA with no PE, findings concerning for pulmonary edema. EKG showed sinus rhythm with diffuse T wave inversion, unchanged from before.    Pertinent cardiac history: - 1997 CABG - 09/2019 LHC noted severe native vessel disease with 3 out of 4 grafts patent (LIMA-LAD, SeqSVG-1stMrg-2ndMrg, and likely culprit lesion being occluded SVG-RPDA) - 10/2019 loop recorder insertion - 06/2021 transcatheter aortic valve replacement - 04/2021 Severe 3 vessel CAD including CTO p RCA (not engaged), CTO prox Lcx, CTO mid LAD. There is diffuse disease leading to D1 which is unchanged, SVG to RCA is occluded (known), SVG to OM1/2 is widely patent, LIMA to LAD is widely patent - Multiple LHC, most recently 05/2024 for NSTEMI with arm discomfort showed significant triple-vessel disease with patent LIMA to LAD, SVG to circumflex diffusely diseased, known occluded SVG to RCA-medically managed. - 05/2024 TTE noted LVEF 35-40%, LV regional wall abnormalities, mild concentric LVH, grade III diastolic dysfunction in 05/2024   Pertinent Lab Values: Last Labs       Creatinine, Ser  Date Value Ref Range Status  07/01/2024 1.82 (H) 0.61 - 1.24 mg/dL Final          BUN  Date Value Ref Range Status  07/01/2024 32 (H) 8 - 23 mg/dL Final  96/84/7976 24 8 - 27 mg/dL Final         Potassium  Date Value Ref Range Status  07/01/2024 4.4 3.5 - 5.1 mmol/L Final         Sodium  Date Value Ref Range Status  07/01/2024 142 135 - 145 mmol/L Final  08/17/2021 140 134 - 144 mmol/L Final           B Natriuretic Peptide  Date Value Ref Range Status  03/19/2024 1,635.7 (H) 0.0 - 100.0 pg/mL Final      Comment:      Performed at [facility], [address] [place], [place] [address]           Magnesium   Date Value Ref Range Status  07/01/2024 2.1 1.7 - 2.4 mg/dL Final      Comment:      Performed at [facility], [address] [place], [place] [address]           Hgb A1c MFr Bld  Date Value Ref Range Status  06/30/2024 7.2 (H) 4.8 - 5.6 % Final      Comment:      (NOTE) Diagnosis of Diabetes The following HbA1c ranges recommended by the American Diabetes Association (ADA) may be used as an aid in the diagnosis of diabetes mellitus.   Hemoglobin             Suggested A1C NGSP%  Diagnosis   <5.7                   Non Diabetic   5.7-6.4                Pre-Diabetic   >6.4                   Diabetic   <7.0                   Glycemic control for                       adults with diabetes.               TSH  Date Value Ref Range Status  04/23/2021 5.958 (H) 0.350 - 4.500 uIU/mL Final      Comment:      Performed by a 3rd Generation assay with a functional sensitivity of <=0.01 uIU/mL. Performed at [facility], [address] [place], [place] [address]          Vital Signs: Temp:  [97.5 F (36.4 C)-98.7 F (37.1 C)] 97.5 F (36.4 C) (01/27 0744) Pulse Rate:  [56-78] 68 (01/27 0744) Cardiac Rhythm: Normal sinus rhythm;Bundle branch block (01/27 0829) Resp:  [15-24] 17 (01/27 0744) BP: (107-147)/(54-77) 147/67 (01/27 0744) SpO2:  [95 %-100 %] 97 % (01/27 0744) FiO2 (%):  [28 %] 28  % (01/27 0626) Weight:  [61.5 kg (135 lb 9.3 oz)] 61.5 kg (135 lb 9.3 oz) (01/27 0456)   Intake/Output Summary (Last 24 hours) at 07/01/2024 0912 Last data filed at 07/01/2024 0357    Gross per 24 hour  Intake 120 ml  Output 3200 ml  Net -3080 ml      Current Heart Failure Medications:  Loop diuretic: torsemide  20 mg daily Beta-Blocker: metoprolol  succinate 12.5 mg daily ACEI/ARB/ARNI: none MRA: spironolactone  25 mg daily SGLT2i: Jardiance  25 mg daily Other: isosorbide  mononitrate 15 mg daily   Prior to admission Heart Failure Medications:  Loop diuretic: torsemide  20 mg daily Beta-Blocker: metoprolol  succinate 12.5 mg daily ACEI/ARB/ARNI: none MRA: spironolactone  25 mg daily SGLT2i: Jardiance  25 mg daily Other: isosorbide  mononitrate 15 mg daily, nitroglycerin  0.4 SL tab prn   Assessment: 1. Acute on chronic combined systolic and diastolic heart failure (LVEF 35-40%) LV regional wall abnormalities, mild concentric LVH, grade III diastolic dysfunction in 05/2024, due to ICM. NYHA class III symptoms.    -Symptoms: Reports SOB improving. Reports mild orthopnea. Reports weakness with walking, not due to DOE. Denies dizziness, lightheadedness, LEE. -Volume: No LEE. Large volume, light colored urine output. Continue torsemide  20 mg daily today. Can consider holding tomorrow if renal function continues to worsen. -Hemodynamics: BP low to normal, HR 60-70s. -BB: Continue metoprolol  succinate 12.5 mg daily. HR stable. -ACEI/ARB/ARNI: Can consider adding losartan 12.5 mg daily pending resolution of AKI. Would consider low dose given current BP is low to normal and patient has difficulty ambulating, however patient denies orthostasis and may tolerate losartan 25 mg fine.  -MRA: Continue spironolactone  25 mg daily. Scr 1.82, K 4.4. -SGLT2i: Continue Jardiance  25 mg daily. A1c 7.2%. No urinary symptoms or urinary catheter.   Plan: 1) Medication changes recommended at this time: - None at  this time.   2) Patient assistance: - None at this time.   3) Education: - Patient has been educated on current HF medications and potential additions to HF medication regimen - Patient verbalizes understanding that  over the next few months, these medication doses may change and more medications may be added to optimize HF regimen - Patient has been educated on basic disease state pathophysiology and goals of therapy   Patient seen alongside Calton Nash, [place] PharmD Candidate  Please do not hesitate to reach out with questions or concerns,  Jaun Bash, PharmD, CPP, BCPS, [facility] Heart Failure Pharmacist  Phone - [phone] 07/01/2024 2:51 PM

## 2024-07-01 NOTE — Progress Notes (Signed)
 " Progress Note   Patient: Thomas Moore FMW:[id number] DOB: 1956-04-09 DOA: 06/29/2024  DOS: the patient was seen and examined on 07/01/2024   Brief hospital course:  69 y.o. male with medical history significant for coronary artery disease status post four-vessel CABG, CHF, COPD, CVA, type 2 diabetes mellitus, GERD, hypertension and dyslipidemia, seizure disorder and status post TAVR, who presented to the emergency room with acute onset of worsening dyspnea over the last couple of weeks that deteriorated over the last couple of days.    Assessment and Plan:   Acute hypoxic respiratory failure - Currently requiring 2 L nasal cannula to maintain O2 sats in the 90s.  Attempt to wean O2 as possible.   Acute exacerbation of HFrEF - Worsening orthopnea, DOE, dyspnea prior to admission.  proBNP greater than 20,000.  Chest x-ray personally reviewed noting congestion.  Appears to be responding well to IV Lasix  40 mg twice daily.  Diuresed greater than 5 L since yesterday.  Starting to appear intravascularly dry.  Will transition to p.o. torsemide , discontinue IV diuretic.  Continue home cardiac regimen, GDMT with Jardiance , metoprolol , spironolactone .  Holding ARB secondary to acute kidney injury.   Concern for multifocal pneumonia - CT imaging showing patchy opacities, edema versus pneumonia.  Patient not complaining of purulent sputum, fever.  No leukocytosis.  Was given empiric antibiotics.  Will discontinue antibiotics at this time.  Monitor respiratory function.  Acute kidney injury - Likely multifactorial etiology.  Appears intravascularly dry now.  Discontinued IV diuretics.  Will recheck BMP in AM.   Paroxysmal atrial fibrillation - Eliquis  and amiodarone  on board.  Continues on telemetry.   Coronary artery disease - Continue Plavix , statin.   Diabetes mellitus - Insulin  sliding scale on board.  Insulin  glargine 10 units daily.   Hypothyroidism - Synthroid  on board.   Seizure  disorder - Resume Depakote .   Physical debilitation muscle weakness - Patient appears to have concerns about living alone at home.  Concerned about remembering to take his medications.  Pending PT/OT.  Will work with patient, family, TOC on disposition planning.   Subjective: Patient sitting at bedside chair.  States he feels a improved as far as his breathing.  Responded quite well to diuresis.  Denies any fever, chills, chest pain, nausea, vomiting, abdominal pain.  Continues to appear quite concerned about living alone at home, being unable to care for himself.    Physical Exam:  Vitals:   07/01/24 0357 07/01/24 0456 07/01/24 0626 07/01/24 0744  BP: (!) 113/54   (!) 147/67  Pulse: 64   68  Resp: 19   17  Temp: 97.8 F (36.6 C)   (!) 97.5 F (36.4 C)  TempSrc: Oral     SpO2: 100%  100% 97%  Weight:  61.5 kg    Height:        GENERAL:  Alert, pleasant, no acute distress  HEENT:  EOMI, nasal cannula CARDIOVASCULAR: Irregularly irregular RESPIRATORY: Poor air movement bilaterally GASTROINTESTINAL:  Soft, nontender, nondistended EXTREMITIES:  No LE edema bilaterally NEURO:  No new focal deficits appreciated SKIN:  No rashes noted PSYCH:  Appropriate mood and affect, anxious   Data Reviewed:  Imaging Studies: CT Angio Chest PE W/Cm &/Or Wo Cm Result Date: 06/29/2024 EXAM: CTA of the Chest with contrast for PE 06/29/2024 09:33:08 PM TECHNIQUE: CTA of the chest was performed after the administration of 75 mL of iohexol  (OMNIPAQUE ) 350 MG/ML injection. Multiplanar reformatted images are provided for review. MIP images are  provided for review. Automated exposure control, iterative reconstruction, and/or weight based adjustment of the mA/kV was utilized to reduce the radiation dose to as low as reasonably achievable. COMPARISON: Chest radiograph 06/29/2024 and CT chest 01/17/2024. CLINICAL HISTORY: Pulmonary embolism (PE) suspected, high prob. Shortness of breath and chest pain. Cough  for 1.5 weeks. FINDINGS: PULMONARY ARTERIES: Pulmonary arteries are adequately opacified for evaluation. No pulmonary embolism. Main pulmonary artery is normal in caliber. MEDIASTINUM: Cardiac enlargement. Aortic valve replacement. Coronary artery bypass. No pericardial effusions. Calcification of the aorta without aneurysm. The thyroid  gland is unremarkable. Esophagus is decompressed. Postoperative changes with median sternotomy. LYMPH NODES: Prominent mediastinal lymph nodes with pretracheal lymph nodes measuring up to about 1.3 cm diameter. Lymphadenopathy is similar to the prior study, most likely reactive. No axillary lymphadenopathy. LUNGS AND PLEURA: Moderate bilateral pleural effusions. Atelectasis or consolidation in the lung bases. Peribronchial thickening with mucous plugging. Patchy ground glass infiltrates throughout the lungs with a mosaic pattern likely representing edema or possibly multifocal pneumonia. No pneumothorax. UPPER ABDOMEN: Intrarenal stone on the left measuring 6 mm in diameter. No hydronephrosis. SOFT TISSUES AND BONES: Degenerative changes in the spine. No acute bony abnormalities. No acute soft tissue abnormality. IMPRESSION: 1. No evidence of pulmonary embolism. 2. Moderate bilateral pleural effusions. 3. Patchy ground glass infiltrates throughout the lungs with a mosaic pattern, likely representing edema or possibly multifocal pneumonia. 4. Bibasilar atelectasis or consolidation. 5. Peribronchial thickening with mucous plugging, which can be seen with bronchitis. Electronically signed by: Elsie Gravely MD 06/29/2024 09:40 PM EST RP Workstation: [id number]   DG Chest 2 View Result Date: 06/29/2024 EXAM: 2 VIEW(S) XRAY OF THE CHEST 06/29/2024 06:58:00 PM COMPARISON: 05/19/2024 CLINICAL HISTORY: Shortness of breath. FINDINGS: LUNGS AND PLEURA: Mild pulmonary edema. Patchy airspace opacities in lung bases. Small bilateral pleural effusions. No pneumothorax. HEART AND MEDIASTINUM:  Cardiomegaly. TAVR stent in place. CABG markers and sternotomy wires noted. Cardiac loop recorder device in place. BONES AND SOFT TISSUES: No acute osseous abnormality. IMPRESSION: 1. Mild pulmonary edema with patchy bibasilar airspace opacities. 2. Small bilateral pleural effusions. 3. Cardiomegaly with TAVR stent, postoperative CABG changes, and cardiac loop recorder. Electronically signed by: Elsie Gravely MD 06/29/2024 07:01 PM EST RP Workstation: [id number]   CUP PACEART REMOTE DEVICE CHECK Result Date: 06/24/2024 ILR summary report received. Battery status OK. Normal device function. No new symptom, tachy, brady, or pause episodes. No new AF episodes. Monthly summary reports and ROV/PRN LA CVRS   There are no new results to review at this time.  Previous records (including but not limited to H&P, progress notes, nursing notes, TOC management) were reviewed in assessment of this patient.  Labs: CBC: Recent Labs  Lab 06/29/24 1934 06/30/24 0330 07/01/24 0300  WBC 7.3 5.5 4.3  NEUTROABS 6.1  --   --   HGB 9.4* 8.7* 9.6*  HCT 31.8* 29.2* 32.5*  MCV 88.8 88.8 88.3  PLT 154 109* 134*   Basic Metabolic Panel: Recent Labs  Lab 06/29/24 1934 06/30/24 0330 07/01/24 0300  NA 135 138 142  K 4.1 3.7 4.4  CL 99 103 102  CO2 20* 22 30  GLUCOSE 285* 144* 111*  BUN 31* 29* 32*  CREATININE 1.51* 1.48* 1.82*  CALCIUM  8.4* 8.3* 9.1  MG  --   --  2.1   Liver Function Tests: No results for input(s): AST, ALT, ALKPHOS, BILITOT, PROT, ALBUMIN in the last 168 hours. CBG: Recent Labs  Lab 06/30/24 [phone] 06/30/24 1222 06/30/24 1642 06/30/24 2115 07/01/24  0746  GLUCAP 142* 198* 141* 206* 119*    Scheduled Meds:  amiodarone   200 mg Oral Daily   apixaban   5 mg Oral BID   atorvastatin   80 mg Oral QHS   calcium  citrate  1 tablet Oral BID   clopidogrel   75 mg Oral Daily   cyanocobalamin   1,000 mcg Oral Daily   cyclopentolate   1 drop Left Eye BID   diclofenac  Sodium  4 g  Topical QID   divalproex   250 mg Oral QHS   empagliflozin   25 mg Oral Daily   guaiFENesin   600 mg Oral BID   insulin  aspart  0-15 Units Subcutaneous TID WC   insulin  aspart  0-5 Units Subcutaneous QHS   insulin  glargine-yfgn  10 Units Subcutaneous Daily   ipratropium-albuterol   3 mL Nebulization TID   isosorbide  mononitrate  15 mg Oral Daily   levothyroxine   50 mcg Oral Q0600   metoprolol  succinate  12.5 mg Oral Daily   multivitamin with minerals  1 tablet Oral Daily   spironolactone   25 mg Oral Daily   torsemide   20 mg Oral Daily   Continuous Infusions: PRN Meds:.acetaminophen  **OR** acetaminophen , chlorpheniramine-HYDROcodone , ipratropium-albuterol , magnesium  hydroxide, nitroGLYCERIN , traZODone   Family Communication: None at bedside  Disposition: Status is: Inpatient Remains inpatient appropriate because: Acute HFrEF     Time spent: 39 minutes  Length of inpatient stay: 1 days  Author: Carliss LELON Canales, DO 07/01/2024 11:28 AM  For on call review [email or web address].   "
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
## 2024-07-01 NOTE — Evaluation (Signed)
 Occupational Therapy Evaluation Patient Details Name: Thomas Moore MRN: [id number] DOB: 11-04-1955 Today's Date: 07/01/2024   History of Present Illness   Thomas Moore is a 69 y.o. male with a past medical history of coronary artery disease s/p CABG (1997), chronic HFrEF (30-35% - 08/2023), paroxysmal atrial fibrillation (on Eliquis ), hypertension, hyperlipidemia, diabetes, history stroke, history of SVT, CKD stage IIIa, aortic stenosis s/p TAVR, history of thrombocytopenia, bilateral carotid artery stenosis, s/p loop recorder implant  who presented to the ED on 06/29/24 with acute onset of worsening dyspnea, LE edema, and orthopnea.     Clinical Impressions [name] was seen for OT evaluation this date. Prior to hospital admission, pt was generally independent/MOD I with ADL management, however, he reports increased difficulty with IADL tasks since his recent DC from STR. Pt lives alone in a ground level apartment home. He endorses having HH nsg and therapy services visiting weekly since STR DC. Pt presents with deficits in strength, balance, activity tolerance, and safety awareness limiting his ability to perform ADL management at baseline level. Pt currently requires MAX A for LB ADL management, MIN A for bed mobility, and CGA for safety for functional transfers with a RW.  Pt would benefit from skilled OT services to address noted impairments and functional limitations (see below for any additional details) in order to maximize safety and independence while minimizing future risk of falls, injury, and readmission. Anticipate the need for follow up OT services in the home setting upon acute hospital DC.      If plan is discharge home, recommend the following:   A little help with walking and/or transfers;A little help with bathing/dressing/bathroom;Assist for transportation;Assistance with cooking/housework     Functional Status Assessment   Patient has had a recent decline in their  functional status and demonstrates the ability to make significant improvements in function in a reasonable and predictable amount of time.     Equipment Recommendations   BSC/3in1;Tub/shower bench     Recommendations for Other Services         Precautions/Restrictions   Precautions Precautions: Fall Recall of Precautions/Restrictions: Intact Restrictions Weight Bearing Restrictions Per Provider Order: No     Mobility Bed Mobility Overal bed mobility: Needs Assistance Bed Mobility: Supine to Sit     Supine to sit: HOB elevated, Used rails, Min assist     General bed mobility comments: Min A to advance hips toward EOB.    Transfers Overall transfer level: Needs assistance Equipment used: Rolling walker (2 wheels) Transfers: Sit to/from Stand, Bed to chair/wheelchair/BSC Sit to Stand: Contact guard assist     Step pivot transfers: Contact guard assist     General transfer comment: cueing for safety/technique.      Balance Overall balance assessment: Needs assistance Sitting-balance support: Feet supported, Single extremity supported, No upper extremity supported Sitting balance-Leahy Scale: Good Sitting balance - Comments: steady static sitting, reaching inside BOS.   Standing balance support: Reliant on assistive device for balance, During functional activity, Bilateral upper extremity supported Standing balance-Leahy Scale: Fair                             ADL either performed or assessed with clinical judgement   ADL Overall ADL's : Needs assistance/impaired     Grooming: Sitting;Wash/dry face;Supervision/safety;Set up               Lower Body Dressing: Maximal assistance Lower Body Dressing Details (indicate cue  type and reason): MAX A to don bilat hospital socks at EOB. Anticipate MOD-MAX A for additional LB dressing tasks 2/2 poor LB access in sitting position. Toilet Transfer: Contact guard assist;Rolling walker (2  wheels);Cueing for safety Toilet Transfer Details (indicate cue type and reason): simulated to recliner.         Functional mobility during ADLs: Contact guard assist;Rolling walker (2 wheels);Cueing for safety General ADL Comments: Cueing for safety/technique during STS with RW.     Vision Baseline Vision/History: 1 Wears glasses;4 Cataracts Ability to See in Adequate Light: 2 Moderately impaired Patient Visual Report: No change from baseline       Perception         Praxis         Pertinent Vitals/Pain Pain Assessment Pain Assessment: No/denies pain     Extremity/Trunk Assessment Upper Extremity Assessment Upper Extremity Assessment: Generalized weakness   Lower Extremity Assessment Lower Extremity Assessment: Generalized weakness       Communication Communication Communication: No apparent difficulties Factors Affecting Communication: Hearing impaired   Cognition Arousal: Alert Behavior During Therapy: WFL for tasks assessed/performed Cognition: No apparent impairments                               Following commands: Intact       Cueing  General Comments   Cueing Techniques: Verbal cues  Vss t/o session. spO2 remains WFL (>/= 90%) with pt on 2 L [place], HR remains in upper 70's with activity.   Exercises Other Exercises Other Exercises: Pt educated on safety, falls prevention strategies, safe use of AE/DME for ADL management, and role of OT in acute setting. Extended time taken to discuss DC recs as pt adamant he cannot go home, however, remains near functional baseline.   Shoulder Instructions      Home Living Family/patient expects to be discharged to:: Private residence Living Arrangements: Alone Available Help at Discharge: Other (Comment) Type of Home: Apartment Home Access: Level entry     Home Layout: One level     Bathroom Shower/Tub: Chief Strategy Officer: Standard Bathroom Accessibility: Yes How  Accessible: Accessible via walker Home Equipment: Rolling Walker (2 wheels);Rollator (4 wheels);Grab bars - toilet;Grab bars - tub/shower   Additional Comments: Pt reports that managing in his apartment has become more and more difficult rlies on help for all IADL management including laundry, medication management, grocery shopping, etc.      Prior Functioning/Environment Prior Level of Function : Independent/Modified Independent             Mobility Comments: amb with RW/rollator denies falls history. ADLs Comments: reports generally MOD I with ADL however increased difficulties as of late. Very concerned about living alone with his current state of health.    OT Problem List: Decreased strength;Decreased coordination;Cardiopulmonary status limiting activity;Decreased activity tolerance;Decreased safety awareness;Impaired balance (sitting and/or standing);Decreased knowledge of use of DME or AE;Impaired vision/perception   OT Treatment/Interventions: Self-care/ADL training;Therapeutic exercise;Therapeutic activities;DME and/or AE instruction;Patient/family education;Balance training;Energy conservation      OT Goals(Current goals can be found in the care plan section)   Acute Rehab OT Goals Patient Stated Goal: To go home OT Goal Formulation: With patient Time For Goal Achievement: 07/15/24 Potential to Achieve Goals: Good ADL Goals Pt Will Perform Grooming: sitting;with set-up;with supervision Pt Will Perform Lower Body Dressing: sit to/from stand;with adaptive equipment;with min assist Pt Will Transfer to Toilet: with set-up;with supervision;bedside commode;ambulating Pt  Will Perform Toileting - Clothing Manipulation and hygiene: sit to/from stand;with set-up;with supervision   OT Frequency:  Min 2X/week    Co-evaluation              AM-PAC OT 6 Clicks Daily Activity     Outcome Measure Help from another person eating meals?: None Help from another person  taking care of personal grooming?: A Little Help from another person toileting, which includes using toliet, bedpan, or urinal?: A Little Help from another person bathing (including washing, rinsing, drying)?: A Lot Help from another person to put on and taking off regular upper body clothing?: A Little Help from another person to put on and taking off regular lower body clothing?: A Lot 6 Click Score: 17   End of Session Equipment Utilized During Treatment: Gait belt;Rolling walker (2 wheels)  Activity Tolerance: Patient tolerated treatment well Patient left: in chair;with call bell/phone within reach;with chair alarm set  OT Visit Diagnosis: Other abnormalities of gait and mobility (R26.89);Muscle weakness (generalized) (M62.81)                Time: 9164-9099 OT Time Calculation (min): 25 min Charges:  OT General Charges $OT Visit: 1 Visit OT Evaluation $OT Eval Moderate Complexity: 1 Mod OT Treatments $Self Care/Home Management : 8-22 mins  Jhonny Pelton, M.S., OTR/L 07/01/24, 11:14 AM
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
## 2024-07-01 NOTE — Evaluation (Signed)
 Physical Therapy Evaluation Patient Details Name: Thomas Moore MRN: [id number] DOB: 05/07/1956 Today's Date: 07/01/2024  History of Present Illness  Thomas Moore is a 69 y.o. male with a past medical history of coronary artery disease s/p CABG (1997), chronic HFrEF (30-35% - 08/2023), paroxysmal atrial fibrillation (on Eliquis ), hypertension, hyperlipidemia, diabetes, history stroke, history of SVT, CKD stage IIIa, aortic stenosis s/p TAVR, history of thrombocytopenia, bilateral carotid artery stenosis, s/p loop recorder implant  who presented to the ED on 06/29/24 with acute onset of worsening dyspnea, LE edema, and orthopnea; admitted for management of acute/chronic CHF secondary to multifocal PNA  Clinical Impression  Patient seated in recliner beginning of treatment session; alert and oriented, follows commands and agreeable to participation with treatment session.  Patient generally verbose, requiring cuing/redirection for focus on task at hand.   Bilat UE/LE strength and ROM grossly symmetrical and WFL for basic transfers and mobility tasks; no focal weakness appreciated.  Denies pain; endorses mild SOB with gait/mobility efforts. Able to complete sit/stand, basic transfers and gait (200') with RW, cga/close sup.  Demonstrates reciprocal stepping pattern with fair step height/length; decreased cadence, but steady, without buckling or LOB. Veers R intermittently, min cuing to avoid obstacles (visual deficits at baseline). Mild SOB with gait efforts, sats 89-91% on RA; improves to 93-94% on RA with seated rest period.  Left on RA end of session; RN informed/aware. Would benefit from skilled PT to address above deficits and promote optimal return to PLOF.; recommend post-acute PT follow up as indicated by interdisciplinary care team.             If plan is discharge home, recommend the following: A little help with walking and/or transfers;A little help with bathing/dressing/bathroom   Can  travel by private vehicle        Equipment Recommendations  (has necessary equip)  Recommendations for Other Services       Functional Status Assessment Patient has had a recent decline in their functional status and demonstrates the ability to make significant improvements in function in a reasonable and predictable amount of time.     Precautions / Restrictions Precautions Precautions: Fall Recall of Precautions/Restrictions: Impaired Restrictions Weight Bearing Restrictions Per Provider Order: No      Mobility  Bed Mobility               General bed mobility comments: seated in recliner beginning/end of treatment session    Transfers   Equipment used: Rolling walker (2 wheels) Transfers: Sit to/from Stand Sit to Stand: Supervision, Contact guard assist                Ambulation/Gait Ambulation/Gait assistance: Supervision, Contact guard assist Gait Distance (Feet): 200 Feet Assistive device: Rolling walker (2 wheels)   Gait velocity: 10' walk time, 7-8 seconds     General Gait Details: reciprocal stepping pattern with fair step height/length; decreased cadence, but steady, without buckling or LOB.  Veers R intermittently, min cuing to avoid obstacles (visual deficits at baseline).  Mild SOB with gait efforts, sats 89-91% on RA  Stairs            Wheelchair Mobility     Tilt Bed    Modified Rankin (Stroke Patients Only)       Balance Overall balance assessment: Needs assistance Sitting-balance support: No upper extremity supported, Feet supported Sitting balance-Leahy Scale: Good     Standing balance support: Bilateral upper extremity supported Standing balance-Leahy Scale: Fair  Pertinent Vitals/Pain Pain Assessment Pain Assessment: No/denies pain    Home Living Family/patient expects to be discharged to:: Private residence Living Arrangements: Alone Available Help at Discharge: Other  (Comment) Type of Home: Apartment Home Access: Level entry       Home Layout: One level Home Equipment: Agricultural Consultant (2 wheels);Rollator (4 wheels);Grab bars - toilet;Grab bars - tub/shower Additional Comments: Pt reports that managing in his apartment has become more and more difficult rlies on help for all IADL management including laundry, medication management, grocery shopping, etc.    Prior Function Prior Level of Function : Independent/Modified Independent             Mobility Comments: amb with RW/rollator denies falls history; no home O2 ADLs Comments: reports generally MOD I with ADL however increased difficulties as of late; patient interested in additional support at discharge as available     Extremity/Trunk Assessment   Upper Extremity Assessment Upper Extremity Assessment: Overall WFL for tasks assessed    Lower Extremity Assessment Lower Extremity Assessment: Generalized weakness (grossly 4- to 4/5 throughout)       Communication   Communication Communication: No apparent difficulties Factors Affecting Communication: Hearing impaired    Cognition Arousal: Alert Behavior During Therapy: WFL for tasks assessed/performed   PT - Cognitive impairments: No apparent impairments                         Following commands: Intact       Cueing Cueing Techniques: Verbal cues     General Comments General comments (skin integrity, edema, etc.): Vss t/o session. spO2 remains WFL (>/= 90%) with pt on 2 L [place], HR remains in upper 70's with activity.    Exercises     Assessment/Plan    PT Assessment Patient needs continued PT services  PT Problem List Decreased activity tolerance;Decreased balance;Decreased mobility;Decreased knowledge of use of DME;Decreased safety awareness;Decreased knowledge of precautions;Cardiopulmonary status limiting activity       PT Treatment Interventions DME instruction;Gait training;Functional mobility  training;Therapeutic activities;Therapeutic exercise;Balance training;Patient/family education    PT Goals (Current goals can be found in the Care Plan section)  Acute Rehab PT Goals Patient Stated Goal: to have more help, to not live alone anymore PT Goal Formulation: With patient Time For Goal Achievement: 07/15/24 Potential to Achieve Goals: Good    Frequency Min 2X/week     Co-evaluation               AM-PAC PT 6 Clicks Mobility  Outcome Measure Help needed turning from your back to your side while in a flat bed without using bedrails?: None Help needed moving from lying on your back to sitting on the side of a flat bed without using bedrails?: None Help needed moving to and from a bed to a chair (including a wheelchair)?: None Help needed standing up from a chair using your arms (e.g., wheelchair or bedside chair)?: None Help needed to walk in hospital room?: None Help needed climbing 3-5 steps with a railing? : A Little 6 Click Score: 23    End of Session Equipment Utilized During Treatment: Gait belt Activity Tolerance: Patient tolerated treatment well Patient left: in chair;with call bell/phone within reach;with chair alarm set Nurse Communication: Mobility status PT Visit Diagnosis: Muscle weakness (generalized) (M62.81);Difficulty in walking, not elsewhere classified (R26.2)    Time: 8976-8956 PT Time Calculation (min) (ACUTE ONLY): 20 min   Charges:   PT Evaluation $PT Eval Moderate Complexity:  1 Mod   PT General Charges $$ ACUTE PT VISIT: 1 Visit       Parissa Chiao H. Delores, PT, DPT, NCS 07/01/24, 12:37 PM [phone]

## 2024-07-02 ENCOUNTER — Telehealth: Payer: Self-pay | Admitting: Pharmacist

## 2024-07-02 ENCOUNTER — Telehealth: Payer: Self-pay | Admitting: Cardiology

## 2024-07-02 DIAGNOSIS — I5043 Acute on chronic combined systolic (congestive) and diastolic (congestive) heart failure: Secondary | ICD-10-CM | POA: Diagnosis not present

## 2024-07-02 LAB — BASIC METABOLIC PANEL WITH GFR
Anion gap: 12 (ref 5–15)
BUN: 36 mg/dL — ABNORMAL HIGH (ref 8–23)
CO2: 30 mmol/L (ref 22–32)
Calcium: 8.9 mg/dL (ref 8.9–10.3)
Chloride: 96 mmol/L — ABNORMAL LOW (ref 98–111)
Creatinine, Ser: 1.9 mg/dL — ABNORMAL HIGH (ref 0.61–1.24)
GFR, Estimated: 38 mL/min — ABNORMAL LOW
Glucose, Bld: 143 mg/dL — ABNORMAL HIGH (ref 70–99)
Potassium: 3.8 mmol/L (ref 3.5–5.1)
Sodium: 138 mmol/L (ref 135–145)

## 2024-07-02 LAB — CBC
HCT: 31.5 % — ABNORMAL LOW (ref 39.0–52.0)
Hemoglobin: 9.5 g/dL — ABNORMAL LOW (ref 13.0–17.0)
MCH: 26.2 pg (ref 26.0–34.0)
MCHC: 30.2 g/dL (ref 30.0–36.0)
MCV: 87 fL (ref 80.0–100.0)
Platelets: 147 10*3/uL — ABNORMAL LOW (ref 150–400)
RBC: 3.62 MIL/uL — ABNORMAL LOW (ref 4.22–5.81)
RDW: 17.2 % — ABNORMAL HIGH (ref 11.5–15.5)
WBC: 4.5 10*3/uL (ref 4.0–10.5)
nRBC: 0 % (ref 0.0–0.2)

## 2024-07-02 LAB — GLUCOSE, CAPILLARY
Glucose-Capillary: 136 mg/dL — ABNORMAL HIGH (ref 70–99)
Glucose-Capillary: 219 mg/dL — ABNORMAL HIGH (ref 70–99)
Glucose-Capillary: 136 mg/dL — ABNORMAL HIGH (ref 70–99)
Glucose-Capillary: 132 mg/dL — ABNORMAL HIGH (ref 70–99)
Glucose-Capillary: 198 mg/dL — ABNORMAL HIGH (ref 70–99)

## 2024-07-02 LAB — MAGNESIUM: Magnesium: 2.2 mg/dL (ref 1.7–2.4)

## 2024-07-02 MED ORDER — CENEGERMIN-BKBJ 0.002 % OP SOLN: 1.0000 [drp] | Freq: Every day | OPHTHALMIC | Status: DC

## 2024-07-02 NOTE — Telephone Encounter (Signed)
 Heart Failure Stewardship Pharmacy Note   PCP: Lenon Layman ORN, MD PCP-Cardiologist: Denyse Bathe, MD   HPI: Thomas Moore is a 69 y.o. male with medical history significant for coronary artery disease status post four-vessel CABG, CHF, COPD, CVA, type 2 diabetes mellitus, GERD, hypertension and dyslipidemia, seizure disorder and status post TAVR, who presented to the emergency room with acute onset of worsening dyspnea over the last couple of weeks that deteriorated over the last couple of days.  He admitted to worsening lower extremity edema as well as dyspnea on exertion and orthopnea.     On admission, proBNP was 79818, HS-troponin was 36, K 4.1, Scr 1.51, CO2 20, and Hgb 9.4. Chest x-ray noted mild pulmonary edema and small bilateral pleural effusion. CTA with no PE, findings concerning for pulmonary edema. EKG showed sinus rhythm with diffuse T wave inversion, unchanged from previous.   Pertinent cardiac history: - 1997 CABG - 09/2019 LHC noted severe native vessel disease with 3 out of 4 grafts patent (LIMA-LAD, SeqSVG-1stMrg-2ndMrg, and likely culprit lesion being occluded SVG-RPDA) - 10/2019 loop recorder insertion - 06/2021 transcatheter aortic valve replacement - 04/2021 Severe 3 vessel CAD including CTO p RCA (not engaged), CTO prox Lcx, CTO mid LAD. There is diffuse disease leading to D1 which is unchanged, SVG to RCA is occluded (known), SVG to OM1/2 is widely patent, LIMA to LAD is widely patent - Multiple LHC, most recently 05/2024 for NSTEMI with arm discomfort showed significant triple-vessel disease with patent LIMA to LAD, SVG to circumflex diffusely diseased, known occluded SVG to RCA-medically managed. - 05/2024 TTE noted LVEF 35-40%, LV regional wall abnormalities, mild concentric LVH, grade III diastolic dysfunction in 05/2024   Pertinent Lab Values: Last Labs       Creatinine, Ser  Date Value Ref Range Status  07/01/2024 1.82 (H) 0.61 - 1.24 mg/dL Final          BUN  Date Value Ref Range Status  07/01/2024 32 (H) 8 - 23 mg/dL Final  96/84/7976 24 8 - 27 mg/dL Final         Potassium  Date Value Ref Range Status  07/01/2024 4.4 3.5 - 5.1 mmol/L Final         Sodium  Date Value Ref Range Status  07/01/2024 142 135 - 145 mmol/L Final  08/17/2021 140 134 - 144 mmol/L Final           B Natriuretic Peptide  Date Value Ref Range Status  03/19/2024 1,635.7 (H) 0.0 - 100.0 pg/mL Final      Comment:      Performed at [facility], [address] [place], [place] [address]           Magnesium   Date Value Ref Range Status  07/01/2024 2.1 1.7 - 2.4 mg/dL Final      Comment:      Performed at [facility], [address] [place], [place] [address]           Hgb A1c MFr Bld  Date Value Ref Range Status  06/30/2024 7.2 (H) 4.8 - 5.6 % Final      Comment:      (NOTE) Diagnosis of Diabetes The following HbA1c ranges recommended by the American Diabetes Association (ADA) may be used as an aid in the diagnosis of diabetes mellitus.   Hemoglobin             Suggested A1C NGSP%  Diagnosis   <5.7                   Non Diabetic   5.7-6.4                Pre-Diabetic   >6.4                   Diabetic   <7.0                   Glycemic control for                       adults with diabetes.               TSH  Date Value Ref Range Status  04/23/2021 5.958 (H) 0.350 - 4.500 uIU/mL Final      Comment:      Performed by a 3rd Generation assay with a functional sensitivity of <=0.01 uIU/mL. Performed at [facility], [address] [place], [place] [address]          Vital Signs: Temp:  [97.5 F (36.4 C)-98.5 F (36.9 C)] 97.8 F (36.6 C) (01/28 0341) Pulse Rate:  [63-82] 63 (01/28 0341) Cardiac Rhythm: Normal sinus rhythm;Bundle branch block (01/27 1900) Resp:  [17-18] 18 (01/27 2336) BP: (112-147)/(58-75) 114/65 (01/28 0341) SpO2:  [89 %-100 %] 92 % (01/28 0341) FiO2 (%):  [28 %] 28  % (01/27 0626) Weight:  [61.5 kg (135 lb 9.3 oz)] 61.5 kg (135 lb 9.3 oz) (01/27 0456)   Intake/Output Summary (Last 24 hours) at 07/02/2024 0428 Last data filed at 07/02/2024 0340    Gross per 24 hour  Intake --  Output 3025 ml  Net -3025 ml      Current Heart Failure Medications:  Loop diuretic: torsemide  20 mg daily Beta-Blocker: metoprolol  succinate 12.5 mg daily ACEI/ARB/ARNI: none MRA: spironolactone  25 mg daily SGLT2i: Jardiance  25 mg daily Other: isosorbide  mononitrate 15 mg daily   Prior to admission Heart Failure Medications:  Loop diuretic: torsemide  20 mg daily Beta-Blocker: metoprolol  succinate 12.5 mg daily ACEI/ARB/ARNI: none MRA: spironolactone  25 mg daily SGLT2i: Jardiance  25 mg daily Other: isosorbide  mononitrate 15 mg daily, nitroglycerin  0.4 SL tab prn   Assessment: 1. Acute on chronic combined systolic and diastolic heart failure (LVEF 35-40%) LV regional wall abnormalities, mild concentric LVH, grade III diastolic dysfunction in 05/2024, due to ICM. NYHA class III symptoms.    -Symptoms: Reports cough and chest pain associated with cough. Denies SOB, LEE, SOB  dizziness, lightheadedness. Reports anxiety about not wanting to be discharged home alone, does not feel like he can care for himself. Reports leaving hearing aid charger at home, currently having difficulty hearing. Reports significant cataracts that limit his ability to read medication bottles and does not feel safe taking his medications. -Volume: No LEE. Down 12.4 kg since admit. Reports decreased urine out today compared to yesterday, darker in color. Continue torsemide  20 mg daily today. Renal function slightly worsened yesterday, relatively stable today. May require adjustment tomorrow if creatinine worsens.  -Hemodynamics: BP low to normal, HR 60-80s. -BB: Continue metoprolol  succinate 12.5 mg daily. HR stable. -ACEI/ARB/ARNI: Can consider adding losartan 12.5 mg daily tomorrow, creatine near  baseline. Would consider low dose given current BP is low to normal and patient has difficulty ambulating, however patient denies orthostasis and may tolerate losartan 25 mg fine.  -MRA: Continue spironolactone  25 mg daily. Scr 1.90, K 3.8. -SGLT2i: Continue Jardiance   25 mg daily. A1c 7.2%. No urinary symptoms or urinary catheter.   Plan: 1) Medication changes recommended at this time: - Can consider adding losartan 12.5 mg daily tomorrow if creatinine improves to baseline of ~1.8.   2) Patient assistance: - Consider Pill-Packing given patient has difficulty reading medication bottles and does not feel safe taking his medications.   3) Education: - Patient has been educated on current HF medications and potential additions to HF medication regimen - Patient verbalizes understanding that over the next few months, these medication doses may change and more medications may be added to optimize HF regimen - Patient has been educated on basic disease state pathophysiology and goals of therapy   Patient seen alongside Calton Nash, [place] PharmD Candidate   Please do not hesitate to reach out with questions or concerns,   Jaun Bash, PharmD, CPP, BCPS, [facility] Heart Failure Pharmacist  Phone - [phone] 07/01/2024 2:51 PM

## 2024-07-02 NOTE — Progress Notes (Signed)
 Occupational Therapy Treatment Patient Details Name: Thomas Moore MRN: [id number] DOB: 1955/08/14 Today's Date: 07/02/2024   History of present illness Thomas Moore is a 69 y.o. male with a past medical history of CAD s/p CABG (1997), chronic HFrEF (30-35% - 08/2023), paroxysmal atrial fibrillation (on Eliquis ), HTN, HLD, DM, CVA, SVT, CKD stage IIIa, aortic stenosis s/p TAVR, history of thrombocytopenia, bilateral carotid artery stenosis, s/p loop recorder implant  who presented to the ED on 06/29/24 with acute onset of worsening dyspnea, LE edema, and orthopnea; admitted for management of acute/chronic CHF secondary to multifocal PNA   OT comments  Pt agreeable to participate in OT session focusing on functional mobility and ADL tasks. Pt pleasantly verbose requires redirection throughout. Able to perform functional STS transfers and mobility t/f bathroom using RW, CGA with cues for safety / sequencing. Grooming tasks standing at sink with up to MIN A for object manipulation, CGA for occasional postural sway without UE support on AD. Poor safety awareness throughout. Anticipate pt will require assist for med and financial mgmt 2/2 hx of cataracts and visual deficits, OT will follow acutely.        If plan is discharge home, recommend the following:  A little help with walking and/or transfers;A little help with bathing/dressing/bathroom;Assist for transportation;Assistance with cooking/housework   Equipment Recommendations  BSC/3in1;Tub/shower bench       Precautions / Restrictions Precautions Precautions: Fall Recall of Precautions/Restrictions: Impaired Restrictions Weight Bearing Restrictions Per Provider Order: No       Mobility Bed Mobility Overal bed mobility: Needs Assistance Bed Mobility: Supine to Sit, Sit to Supine     Supine to sit: Supervision Sit to supine: Supervision        Transfers Overall transfer level: Needs assistance Equipment used: Rolling walker (2  wheels) Transfers: Sit to/from Stand Sit to Stand: Supervision, Contact guard assist     Step pivot transfers: Contact guard assist     General transfer comment: cueing for safety/technique. poor carryover     Balance Overall balance assessment: Needs assistance Sitting-balance support: No upper extremity supported, Feet supported Sitting balance-Leahy Scale: Good Sitting balance - Comments: steady static sitting, reaching inside BOS.   Standing balance support: Bilateral upper extremity supported Standing balance-Leahy Scale: Fair Standing balance comment: increased postural sway without UE support on RW                           ADL either performed or assessed with clinical judgement   ADL Overall ADL's : Needs assistance/impaired     Grooming: Oral care;Wash/dry face;Wash/dry hands;Standing;Minimal assistance Grooming Details (indicate cue type and reason): up to MIN A to open toothbrush and toothpaste. standing at sink, supervision with up to CGA for increased postural sway without UE support                 Toilet Transfer: Contact guard assist;Rolling walker (2 wheels);Cueing for safety;Ambulation Toilet Transfer Details (indicate cue type and reason): t/f bathroom using RW Toileting- Clothing Manipulation and Hygiene: Sit to/from stand;Supervision/safety       Functional mobility during ADLs: Contact guard assist;Rolling walker (2 wheels);Cueing for safety General ADL Comments: cues for safety, technique with poor carryover due to pt hyperverbal     Vision Baseline Vision/History: 1 Wears glasses;4 Cataracts Ability to See in Adequate Light: 2 Moderately impaired Patient Visual Report: No change from baseline           Communication Communication Communication: No  apparent difficulties Factors Affecting Communication: Hearing impaired   Cognition Arousal: Alert Behavior During Therapy: WFL for tasks assessed/performed Cognition: No  apparent impairments             OT - Cognition Comments: tangetial, verbose, perseverates                 Following commands: Intact        Cueing   Cueing Techniques: Verbal cues        General Comments VSS on RA    Pertinent Vitals/ Pain       Pain Assessment Pain Assessment: No/denies pain   Frequency  Min 2X/week        Progress Toward Goals  OT Goals(current goals can now be found in the care plan section)  Progress towards OT goals: Progressing toward goals  Acute Rehab OT Goals OT Goal Formulation: With patient Time For Goal Achievement: 07/15/24 Potential to Achieve Goals: Good  Plan         AM-PAC OT 6 Clicks Daily Activity     Outcome Measure   Help from another person eating meals?: None Help from another person taking care of personal grooming?: A Little Help from another person toileting, which includes using toliet, bedpan, or urinal?: A Little Help from another person bathing (including washing, rinsing, drying)?: A Lot Help from another person to put on and taking off regular upper body clothing?: A Little Help from another person to put on and taking off regular lower body clothing?: A Lot 6 Click Score: 17    End of Session Equipment Utilized During Treatment: Gait belt;Rolling walker (2 wheels)  OT Visit Diagnosis: Other abnormalities of gait and mobility (R26.89);Muscle weakness (generalized) (M62.81)   Activity Tolerance Patient tolerated treatment well   Patient Left in bed;with bed alarm set;with call bell/phone within reach   Nurse Communication Mobility status        Time: 8493-8474 OT Time Calculation (min): 19 min  Charges: OT General Charges $OT Visit: 1 Visit OT Treatments $Self Care/Home Management : 8-22 mins Hinata Diener L. [place]ina Stanfield, OTR/L  07/02/24, 5:02 PM

## 2024-07-02 NOTE — Progress Notes (Signed)
 PT Cancellation Note  Patient Details Name: ROBERTT BUDA MRN: [id number] DOB: [birth year]   Cancelled Treatment:    Reason Eval/Treat Not Completed: Other (comment) On arrival at 16:50 pt was cutting his sweet potato and prepping his hamburger.  States I don't really want to do anything and let this all get cold.  Pleasantly refused PT.  Will maintain on caseload and attempt to see at a later date.    Carmin JONELLE Deed, DPT 07/02/2024, 4:51 PM

## 2024-07-02 NOTE — Progress Notes (Signed)
 [facility] CARDIOLOGY PROGRESS NOTE   Patient ID: Thomas Moore MRN: [id number] DOB/AGE: August 22, 1955 69 y.o.  Admit date: 06/29/2024 Referring Physician Dr. Arlon Primary Physician Lenon Layman ORN, MD  Primary Cardiologist Dr. Fernand Reason for Consultation AoCHF  HPI: Thomas Moore is a 69 y.o. male with a past medical history of coronary artery disease s/p CABG (1997), chronic HFrEF (30-35% - 08/2023), paroxysmal atrial fibrillation (on Eliquis ), hypertension, hyperlipidemia, diabetes, history stroke, history of SVT, CKD stage IIIa, aortic stenosis s/p TAVR, history of thrombocytopenia, bilateral carotid artery stenosis, s/p loop recorder implant who presented to the ED on 06/29/2024 for SOB. Cardiology was consulted for further evaluation.   Interval History:  -Patient seen and examined this AM, resting in bed.  -Denies any CP, states SOB is much improved. No LE edema. -BP and HR stable. -No events on tele.  Review of systems complete and found to be negative unless listed above   Vitals:   07/01/24 2336 07/02/24 0341 07/02/24 0500 07/02/24 0808  BP: (!) 112/58 114/65  118/60  Pulse: 63 63  61  Resp: 18   16  Temp: 97.7 F (36.5 C) 97.8 F (36.6 C)  (!) 97.3 F (36.3 C)  TempSrc:  Oral    SpO2: (!) 89% 92%  91%  Weight:   59.8 kg   Height:         Intake/Output Summary (Last 24 hours) at 07/02/2024 9093 Last data filed at 07/02/2024 9188 Gross per 24 hour  Intake --  Output 2975 ml  Net -2975 ml     PHYSICAL EXAM General: Chronically ill appearing male, well nourished, in no acute distress. HEENT: Normocephalic and atraumatic. Neck: No JVD.  Lungs: Normal respiratory effort on room air. Clear bilaterally to auscultation. No wheezes, crackles, rhonchi.  Heart: HRRR. Normal S1 and S2 without gallops or murmurs. Radial & DP pulses 2+ bilaterally. Abdomen: Non-distended appearing.  Msk: Normal strength and tone for age. Extremities: No clubbing, cyanosis or  edema.   Neuro: Alert and oriented X 3. Psych: Mood appropriate, affect congruent.    LABS: Basic Metabolic Panel: Recent Labs    07/01/24 0300 07/02/24 0517  NA 142 138  K 4.4 3.8  CL 102 96*  CO2 30 30  GLUCOSE 111* 143*  BUN 32* 36*  CREATININE 1.82* 1.90*  CALCIUM  9.1 8.9  MG 2.1 2.2   Liver Function Tests: No results for input(s): AST, ALT, ALKPHOS, BILITOT, PROT, ALBUMIN in the last 72 hours. No results for input(s): LIPASE, AMYLASE in the last 72 hours. CBC: Recent Labs    06/29/24 1934 06/30/24 0330 07/01/24 0300 07/02/24 0517  WBC 7.3   < > 4.3 4.5  NEUTROABS 6.1  --   --   --   HGB 9.4*   < > 9.6* 9.5*  HCT 31.8*   < > 32.5* 31.5*  MCV 88.8   < > 88.3 87.0  PLT 154   < > 134* 147*   < > = values in this interval not displayed.   Cardiac Enzymes: No results for input(s): CKTOTAL, CKMB, CKMBINDEX, TROPONINIHS in the last 72 hours. BNP: No results for input(s): BNP in the last 72 hours. D-Dimer: No results for input(s): DDIMER in the last 72 hours. Hemoglobin A1C: Recent Labs    06/30/24 0330  HGBA1C 7.2*   Fasting Lipid Panel: No results for input(s): CHOL, HDL, LDLCALC, TRIG, CHOLHDL, LDLDIRECT in the last 72 hours. Thyroid  Function Tests: No results for input(s): TSH,  T4TOTAL, T3FREE, THYROIDAB in the last 72 hours.  Invalid input(s): FREET3 Anemia Panel: No results for input(s): VITAMINB12, FOLATE, FERRITIN, TIBC, IRON, RETICCTPCT in the last 72 hours.  No results found.    ECHO 05/2024: 1. Left ventricular ejection fraction, by estimation, is 35 to 40%. The left ventricle has moderately decreased function. The left ventricle demonstrates regional wall motion abnormalities (see scoring diagram/findings for description). The left ventricular internal cavity size was mildly dilated. There is mild concentric left ventricular hypertrophy. Left ventricular diastolic  parameters are  consistent with Grade III diastolic dysfunction (restrictive).   2. Right ventricular systolic function is mildly reduced. The right ventricular size is mildly enlarged. Mildly increased right ventricular wall thickness.   3. The mitral valve is normal in structure. No evidence of mitral valve  regurgitation.   4. The aortic valve is normal in structure. Aortic valve regurgitation is  not visualized. Aortic valve sclerosis is present, with no evidence of  aortic valve stenosis.    TELEMETRY (personally reviewed): sinus rhythm rate 70s  EKG (personally reviewed): NSR rate 74 bpm  DATA reviewed by me 07/02/24: last 24h vitals tele labs imaging I/O, hospitalist progress note  Principal Problem:   Acute on chronic combined systolic (congestive) and diastolic (congestive) heart failure (HCC) Active Problems:   Paroxysmal atrial fibrillation (HCC)   Hypothyroidism   Dyslipidemia   Type 2 diabetes mellitus without complications (HCC)   Coronary artery disease   Seizure disorder (HCC)   Multifocal pneumonia    ASSESSMENT AND PLAN: [name] is a 69 y.o. male with a past medical history of coronary artery disease s/p CABG (1997), chronic HFrEF (30-35% - 08/2023), paroxysmal atrial fibrillation (on Eliquis ), hypertension, hyperlipidemia, diabetes, history stroke, history of SVT, CKD stage IIIa, aortic stenosis s/p TAVR, history of thrombocytopenia, bilateral carotid artery stenosis, s/p loop recorder implant who presented to the ED on 06/29/2024 for SOB. Cardiology was consulted for further evaluation.   # Acute on chronic HFrEF # Coronary artery disease s/p CABG # Paroxysmal atrial fibrillation # S/p TAVR Patient presented with worsening SOB/DOE for a few weeks. BNP elevated at 20,181. CXR with mild pulmonary edema. Started on IV lasix , net negative 5.8L today.  -Continue PO torsemide  20 mg daily. Can consider increased dose if poor UOP. -Continue jardiance  25 mg daily and  spironolactone  25 mg daily. Consider addition of ARB outpatient if BP and renal function will tolerate. -Continue eliquis  5 mg twice daily.  -Continue amiodarone  200 mg daily.  -Continue atorvastatin  80 mg daily and plavix  75 mg daily.  -Continue imdur  15 mg daily and metoprolol  succinate 12.5 mg daily.  -Patient continues to express concern that he is unable to care for himself at home but likely does not qualify for SNF, would likely benefit from ALF. TOC is following.  This patient's case was discussed and created with Dr. Wilburn and he is in agreement.  Signed:  Danita Bloch, PA-C  07/02/2024, 9:06 AM [facility]

## 2024-07-02 NOTE — TOC Progression Note (Signed)
 Transition of Care [facility]) - Progression Note    Patient Details  Name: Thomas Moore MRN: [id number] Date of Birth: July 03, 1955  Transition of [facility] CM/SW Contact  Shasta DELENA Daring, RN Phone Number: 07/02/2024, 4:53 PM  Clinical Narrative:     RNCM asked financial navigator to assess patient medicaid eligibility and provide assistance for seeking finances for placement in group home.  Notified TOC supervisor of placement difficulties.  Contacted patient's ex-wife. She is also of the opinion that he cannot live alone safely anymore. He has cataracts that cannot be operated on until a different condition with his cornea is resolved (goes to [facility] for eyecare), but because of this he cannot see his medicine bottles. She said he has had a cognitive decline in recent years. Notified her we are trying to find a group home that will take him with his limited finances. Advised we would update her. She gave permission to give her name and contact info to group home contact.   Expected Discharge Plan: Home w Home Health Services Barriers to Discharge: Continued Medical Work up               Expected Discharge Plan and Services   Discharge Planning Services: CM Consult   Living arrangements for the past 2 months: Apartment                             HH Agency: [facility] (now [facility])         Social Drivers of Health (SDOH) Interventions SDOH Screenings   Food Insecurity: No Food Insecurity (05/22/2024)  Housing: Low Risk (05/22/2024)  Transportation Needs: No Transportation Needs (05/22/2024)  Utilities: Not At Risk (05/22/2024)  Financial Resource Strain: Low Risk  (03/06/2024)   Received from [facility]  Social Connections: Moderately Integrated (05/22/2024)  Tobacco Use: Medium Risk (06/11/2024)   Received from [facility]    Readmission Risk Interventions    07/01/2024   12:05 PM 05/22/2024    1:47 PM  05/06/2024   11:17 AM  Readmission Risk Prevention Plan  Transportation Screening -- Complete Complete  PCP or Specialist Appt within 3-5 Days   Complete  Social Work Consult for Recovery Care Planning/Counseling   Complete  Medication Review Oceanographer) Complete Complete Complete  PCP or Specialist appointment within 3-5 days of discharge Complete Complete   HRI or Home Care Consult Complete Complete   SW Recovery Care/Counseling Consult  Complete   Palliative Care Screening Not Applicable Not Applicable   Skilled Nursing Facility Complete Complete

## 2024-07-02 NOTE — Telephone Encounter (Signed)
 Pt is unable to recall what the text message said. Patient is currently at [facility] and may not be going home d/t needing more care. States he will call ex-wife to see if she can go to his house and bring his remote monitor. Last connection in [facility] was 06/29/2024. Patient advised to call back with update. Voiced understanding and agreeable to plan.

## 2024-07-02 NOTE — Inpatient Diabetes Management (Signed)
 Inpatient Diabetes Program Recommendations  AACE/ADA: New Consensus Statement on Inpatient Glycemic Control   Target Ranges:  Prepandial:   less than 140 mg/dL      Peak postprandial:   less than 180 mg/dL (1-2 hours)      Critically ill patients:  140 - 180 mg/dL    Latest Reference Range & Units 07/01/24 07:46 07/01/24 13:16 07/01/24 16:09 07/01/24 21:02  Glucose-Capillary 70 - 99 mg/dL 880 (H) 744 (H) 881 (H) 268 (H)   Review of Glycemic Control  Diabetes history: DM2 Outpatient Diabetes medications: Tresiba  10 units daily, Trulicity  4.5 mg Qweek, Jardiance  25 mg daily Current orders for Inpatient glycemic control: Semglee  10 units daily, Novolog  0-15 units TID with meals, Novolog  0-5 units at bedtime, Jardiance  25 mg daily  Inpatient Diabetes Program Recommendations:    Insulin : May want to consider ordering Novolog  2 units TID with meals for meal coverage if patient eats at least 50% of meals.  Thanks, Earnie Gainer, RN, MSN, CDCES Diabetes Coordinator Inpatient Diabetes Program [phone] (Team Pager from 8am to 5pm)

## 2024-07-02 NOTE — Telephone Encounter (Signed)
 Pt called stating he got a text about a problem with his device. Did not see any notes about it. Please advise.

## 2024-07-02 NOTE — Progress Notes (Signed)
 " Progress Note   Patient: Thomas Moore FMW:[id number] DOB: 04/12/1956 DOA: 06/29/2024  DOS: the patient was seen and examined on 07/02/2024   Brief hospital course:  69 y.o. male with medical history significant for coronary artery disease status post four-vessel CABG, CHF, COPD, CVA, type 2 diabetes mellitus, GERD, hypertension and dyslipidemia, seizure disorder and status post TAVR, who presented to the emergency room with acute onset of worsening dyspnea over the last couple of weeks that deteriorated over the last couple of days.    Assessment and Plan:   Acute hypoxic respiratory failure 2/2 chf, now weaned off o2   Acute exacerbation of HFrEF - Worsening orthopnea, DOE, dyspnea prior to admission.  proBNP greater than 20,000.  Chest x-ray personally reviewed noting congestion.  Appears to be responding well to IV Lasix  40 mg twice daily.  Diuresed greater than 8 L since yesterday.  Starting to appear intravascularly dry.  transitioned to p.o. torsemide ,  Continue home cardiac regimen, GDMT with Jardiance , metoprolol , spironolactone .    Concern for multifocal pneumonia - CT imaging showing patchy opacities, edema versus pneumonia.  Patient not complaining of purulent sputum, fever.  No leukocytosis.  Was given empiric antibiotics.  Abx discontinued. Monitor respiratory function.  Acute kidney injury on ckd 3b Resolved kidney function now at baseline of around 1.9   Paroxysmal atrial fibrillation Hx PE - Eliquis  and amiodarone  on board.  Continues on telemetry.   Coronary artery disease - Continue Plavix , statin.   Diabetes mellitus - Insulin  sliding scale on board.  Insulin  glargine 10 units daily.   Hypothyroidism - Synthroid  on board.   Seizure disorder - Depakote .   Physical debilitation muscle weakness - Patient appears to have concerns about living alone at home.  Concerned about remembering to take his medications.  Doesn't qualify for rehab. No medicaid so no long  term care. TOC looking into the possibility of a group home.   Subjective: reports no pain or dyspnea  Physical Exam:  Vitals:   07/01/24 2336 07/02/24 0341 07/02/24 0500 07/02/24 0808  BP: (!) 112/58 114/65  118/60  Pulse: 63 63  61  Resp: 18   16  Temp: 97.7 F (36.5 C) 97.8 F (36.6 C)  (!) 97.3 F (36.3 C)  TempSrc:  Oral    SpO2: (!) 89% 92%  91%  Weight:   59.8 kg   Height:        GENERAL:  Alert, pleasant, no acute distress  HEENT:  EOMI, nasal cannula CARDIOVASCULAR: Irregularly irregular, rr RESPIRATORY: rales at bases, no tachypnea GASTROINTESTINAL:  Soft, nontender, nondistended EXTREMITIES:  No LE edema bilaterally NEURO:  No new focal deficits appreciated SKIN:  No rashes noted PSYCH:  Appropriate mood and affect, anxious   Data Reviewed:  Imaging Studies: CT Angio Chest PE W/Cm &/Or Wo Cm Result Date: 06/29/2024 EXAM: CTA of the Chest with contrast for PE 06/29/2024 09:33:08 PM TECHNIQUE: CTA of the chest was performed after the administration of 75 mL of iohexol  (OMNIPAQUE ) 350 MG/ML injection. Multiplanar reformatted images are provided for review. MIP images are provided for review. Automated exposure control, iterative reconstruction, and/or weight based adjustment of the mA/kV was utilized to reduce the radiation dose to as low as reasonably achievable. COMPARISON: Chest radiograph 06/29/2024 and CT chest 01/17/2024. CLINICAL HISTORY: Pulmonary embolism (PE) suspected, high prob. Shortness of breath and chest pain. Cough for 1.5 weeks. FINDINGS: PULMONARY ARTERIES: Pulmonary arteries are adequately opacified for evaluation. No pulmonary embolism. Main pulmonary artery is  normal in caliber. MEDIASTINUM: Cardiac enlargement. Aortic valve replacement. Coronary artery bypass. No pericardial effusions. Calcification of the aorta without aneurysm. The thyroid  gland is unremarkable. Esophagus is decompressed. Postoperative changes with median sternotomy. LYMPH NODES:  Prominent mediastinal lymph nodes with pretracheal lymph nodes measuring up to about 1.3 cm diameter. Lymphadenopathy is similar to the prior study, most likely reactive. No axillary lymphadenopathy. LUNGS AND PLEURA: Moderate bilateral pleural effusions. Atelectasis or consolidation in the lung bases. Peribronchial thickening with mucous plugging. Patchy ground glass infiltrates throughout the lungs with a mosaic pattern likely representing edema or possibly multifocal pneumonia. No pneumothorax. UPPER ABDOMEN: Intrarenal stone on the left measuring 6 mm in diameter. No hydronephrosis. SOFT TISSUES AND BONES: Degenerative changes in the spine. No acute bony abnormalities. No acute soft tissue abnormality. IMPRESSION: 1. No evidence of pulmonary embolism. 2. Moderate bilateral pleural effusions. 3. Patchy ground glass infiltrates throughout the lungs with a mosaic pattern, likely representing edema or possibly multifocal pneumonia. 4. Bibasilar atelectasis or consolidation. 5. Peribronchial thickening with mucous plugging, which can be seen with bronchitis. Electronically signed by: Elsie Gravely MD 06/29/2024 09:40 PM EST RP Workstation: [id number]   DG Chest 2 View Result Date: 06/29/2024 EXAM: 2 VIEW(S) XRAY OF THE CHEST 06/29/2024 06:58:00 PM COMPARISON: 05/19/2024 CLINICAL HISTORY: Shortness of breath. FINDINGS: LUNGS AND PLEURA: Mild pulmonary edema. Patchy airspace opacities in lung bases. Small bilateral pleural effusions. No pneumothorax. HEART AND MEDIASTINUM: Cardiomegaly. TAVR stent in place. CABG markers and sternotomy wires noted. Cardiac loop recorder device in place. BONES AND SOFT TISSUES: No acute osseous abnormality. IMPRESSION: 1. Mild pulmonary edema with patchy bibasilar airspace opacities. 2. Small bilateral pleural effusions. 3. Cardiomegaly with TAVR stent, postoperative CABG changes, and cardiac loop recorder. Electronically signed by: Elsie Gravely MD 06/29/2024 07:01 PM EST RP  Workstation: [id number]   CUP PACEART REMOTE DEVICE CHECK Result Date: 06/24/2024 ILR summary report received. Battery status OK. Normal device function. No new symptom, tachy, brady, or pause episodes. No new AF episodes. Monthly summary reports and ROV/PRN LA CVRS   There are no new results to review at this time.  Previous records (including but not limited to H&P, progress notes, nursing notes, TOC management) were reviewed in assessment of this patient.  Labs: CBC: Recent Labs  Lab 06/29/24 1934 06/30/24 0330 07/01/24 0300 07/02/24 0517  WBC 7.3 5.5 4.3 4.5  NEUTROABS 6.1  --   --   --   HGB 9.4* 8.7* 9.6* 9.5*  HCT 31.8* 29.2* 32.5* 31.5*  MCV 88.8 88.8 88.3 87.0  PLT 154 109* 134* 147*   Basic Metabolic Panel: Recent Labs  Lab 06/29/24 1934 06/30/24 0330 07/01/24 0300 07/02/24 0517  NA 135 138 142 138  K 4.1 3.7 4.4 3.8  CL 99 103 102 96*  CO2 20* 22 30 30   GLUCOSE 285* 144* 111* 143*  BUN 31* 29* 32* 36*  CREATININE 1.51* 1.48* 1.82* 1.90*  CALCIUM  8.4* 8.3* 9.1 8.9  MG  --   --  2.1 2.2   Liver Function Tests: No results for input(s): AST, ALT, ALKPHOS, BILITOT, PROT, ALBUMIN in the last 168 hours. CBG: Recent Labs  Lab 07/01/24 0746 07/01/24 1316 07/01/24 1609 07/01/24 2102 07/02/24 0809  GLUCAP 119* 255* 118* 268* 136*    Scheduled Meds:  amiodarone   200 mg Oral Daily   apixaban   5 mg Oral BID   atorvastatin   80 mg Oral QHS   calcium  citrate  1 tablet Oral BID   clopidogrel   75  mg Oral Daily   cyanocobalamin   1,000 mcg Oral Daily   cyclopentolate   1 drop Left Eye BID   diclofenac  Sodium  4 g Topical QID   divalproex   250 mg Oral QHS   empagliflozin   25 mg Oral Daily   guaiFENesin   600 mg Oral BID   insulin  aspart  0-15 Units Subcutaneous TID WC   insulin  aspart  0-5 Units Subcutaneous QHS   insulin  glargine-yfgn  10 Units Subcutaneous Daily   isosorbide  mononitrate  15 mg Oral Daily   levothyroxine   50 mcg Oral Q0600    metoprolol  succinate  12.5 mg Oral Daily   multivitamin with minerals  1 tablet Oral Daily   spironolactone   25 mg Oral Daily   torsemide   20 mg Oral Daily   Continuous Infusions: PRN Meds:.acetaminophen  **OR** acetaminophen , chlorpheniramine-HYDROcodone , ipratropium-albuterol , magnesium  hydroxide, nitroGLYCERIN , traZODone   Family Communication: ex-wife tina updated telephonically 1/28  Disposition: Status is: Inpatient Remains inpatient appropriate because: dispo planning underway    Length of inpatient stay: 2 days  Author: Devaughn KATHEE Ban, MD 07/02/2024 10:19 AM  For on call review [email or web address].   "

## 2024-07-02 NOTE — Plan of Care (Signed)
   Problem: Education: Goal: Ability to describe self-care measures that may prevent or decrease complications (Diabetes Survival Skills Education) will improve Outcome: Progressing Goal: Individualized Educational Video(s) Outcome: Progressing   Problem: Coping: Goal: Ability to adjust to condition or change in health will improve Outcome: Progressing   Problem: Fluid Volume: Goal: Ability to maintain a balanced intake and output will improve Outcome: Progressing   Problem: Health Behavior/Discharge Planning: Goal: Ability to identify and utilize available resources and services will improve Outcome: Progressing Goal: Ability to manage health-related needs will improve Outcome: Progressing   Problem: Metabolic: Goal: Ability to maintain appropriate glucose levels will improve Outcome: Progressing   Problem: Nutritional: Goal: Maintenance of adequate nutrition will improve Outcome: Progressing Goal: Progress toward achieving an optimal weight will improve Outcome: Progressing   Problem: Skin Integrity: Goal: Risk for impaired skin integrity will decrease Outcome: Progressing   Problem: Tissue Perfusion: Goal: Adequacy of tissue perfusion will improve Outcome: Progressing   Problem: Education: Goal: Knowledge of General Education information will improve Description: Including pain rating scale, medication(s)/side effects and non-pharmacologic comfort measures Outcome: Progressing   Problem: Health Behavior/Discharge Planning: Goal: Ability to manage health-related needs will improve Outcome: Progressing   Problem: Clinical Measurements: Goal: Ability to maintain clinical measurements within normal limits will improve Outcome: Progressing Goal: Will remain free from infection Outcome: Progressing Goal: Diagnostic test results will improve Outcome: Progressing Goal: Respiratory complications will improve Outcome: Progressing Goal: Cardiovascular complication will  be avoided Outcome: Progressing   Problem: Activity: Goal: Risk for activity intolerance will decrease Outcome: Progressing   Problem: Nutrition: Goal: Adequate nutrition will be maintained Outcome: Progressing   Problem: Coping: Goal: Level of anxiety will decrease Outcome: Progressing   Problem: Elimination: Goal: Will not experience complications related to bowel motility Outcome: Progressing Goal: Will not experience complications related to urinary retention Outcome: Progressing   Problem: Pain Managment: Goal: General experience of comfort will improve and/or be controlled Outcome: Progressing   Problem: Safety: Goal: Ability to remain free from injury will improve Outcome: Progressing   Problem: Skin Integrity: Goal: Risk for impaired skin integrity will decrease Outcome: Progressing   Problem: Education: Goal: Ability to demonstrate management of disease process will improve Outcome: Progressing Goal: Ability to verbalize understanding of medication therapies will improve Outcome: Progressing Goal: Individualized Educational Video(s) Outcome: Progressing   Problem: Activity: Goal: Capacity to carry out activities will improve Outcome: Progressing   Problem: Cardiac: Goal: Ability to achieve and maintain adequate cardiopulmonary perfusion will improve Outcome: Progressing   Problem: Activity: Goal: Ability to tolerate increased activity will improve Outcome: Progressing   Problem: Clinical Measurements: Goal: Ability to maintain a body temperature in the normal range will improve Outcome: Progressing   Problem: Respiratory: Goal: Ability to maintain adequate ventilation will improve Outcome: Progressing Goal: Ability to maintain a clear airway will improve Outcome: Progressing

## 2024-07-03 ENCOUNTER — Telehealth: Payer: Self-pay | Admitting: Pharmacist

## 2024-07-03 DIAGNOSIS — I5043 Acute on chronic combined systolic (congestive) and diastolic (congestive) heart failure: Secondary | ICD-10-CM | POA: Diagnosis not present

## 2024-07-03 LAB — BASIC METABOLIC PANEL WITH GFR
Anion gap: 13 (ref 5–15)
BUN: 42 mg/dL — ABNORMAL HIGH (ref 8–23)
CO2: 27 mmol/L (ref 22–32)
Calcium: 9.3 mg/dL (ref 8.9–10.3)
Chloride: 95 mmol/L — ABNORMAL LOW (ref 98–111)
Creatinine, Ser: 1.95 mg/dL — ABNORMAL HIGH (ref 0.61–1.24)
GFR, Estimated: 37 mL/min — ABNORMAL LOW
Glucose, Bld: 156 mg/dL — ABNORMAL HIGH (ref 70–99)
Potassium: 4.8 mmol/L (ref 3.5–5.1)
Sodium: 135 mmol/L (ref 135–145)

## 2024-07-03 LAB — GLUCOSE, CAPILLARY
Glucose-Capillary: 155 mg/dL — ABNORMAL HIGH (ref 70–99)
Glucose-Capillary: 187 mg/dL — ABNORMAL HIGH (ref 70–99)
Glucose-Capillary: 157 mg/dL — ABNORMAL HIGH (ref 70–99)
Glucose-Capillary: 277 mg/dL — ABNORMAL HIGH (ref 70–99)

## 2024-07-03 MED ORDER — APIXABAN 2.5 MG PO TABS
2.5000 mg | ORAL_TABLET | Freq: Two times a day (BID) | ORAL | Status: DC
  Administered 2024-07-03 – 2024-07-04 (×2): 2.5 mg via ORAL
  Filled 2024-07-03 (×2): qty 1

## 2024-07-03 NOTE — Care Management Important Message (Signed)
 Important Message  Patient Details  Name: Thomas Moore MRN: [id number] Date of Birth: 1955-06-22   Important Message Given:  Yes - Medicare IM     Beyonka Pitney 07/03/2024, 12:19 PM

## 2024-07-03 NOTE — Telephone Encounter (Addendum)
 Heart Failure Stewardship Pharmacy Note  PCP: Lenon Layman ORN, MD PCP-Cardiologist: Denyse Bathe, MD  HPI: Thomas Moore is a 69 y.o. male with medical history significant for coronary artery disease status post four-vessel CABG, CHF, COPD, CVA, type 2 diabetes mellitus, GERD, hypertension and dyslipidemia, seizure disorder and status post TAVR, who presented to the emergency room with acute onset of worsening dyspnea over the last couple of weeks that deteriorated over the last couple of days.  He admitted to worsening lower extremity edema as well as dyspnea on exertion and orthopnea.     On admission, proBNP was 79818, HS-troponin was 36, K 4.1, Scr 1.51, CO2 20, and Hgb 9.4. Chest x-ray noted mild pulmonary edema and small bilateral pleural effusion. CTA with no PE, findings concerning for pulmonary edema. EKG showed sinus rhythm with diffuse T wave inversion, unchanged from previous.   Pertinent cardiac history: - 1997 CABG - 09/2019 LHC noted severe native vessel disease with 3 out of 4 grafts patent (LIMA-LAD, SeqSVG-1stMrg-2ndMrg, and likely culprit lesion being occluded SVG-RPDA) - 10/2019 loop recorder insertion - 06/2021 transcatheter aortic valve replacement - 04/2021 Severe 3 vessel CAD including CTO p RCA (not engaged), CTO prox Lcx, CTO mid LAD. There is diffuse disease leading to D1 which is unchanged, SVG to RCA is occluded (known), SVG to OM1/2 is widely patent, LIMA to LAD is widely patent - Multiple LHC, most recently 05/2024 for NSTEMI with arm discomfort showed significant triple-vessel disease with patent LIMA to LAD, SVG to circumflex diffusely diseased, known occluded SVG to RCA-medically managed. - 05/2024 TTE noted LVEF 35-40%, LV regional wall abnormalities, mild concentric LVH, grade III diastolic dysfunction  Pertinent Lab Values: Creatinine, Ser  Date Value Ref Range Status  07/03/2024 1.95 (H) 0.61 - 1.24 mg/dL Final   BUN  Date Value Ref Range Status   07/03/2024 42 (H) 8 - 23 mg/dL Final  96/84/7976 24 8 - 27 mg/dL Final   Potassium  Date Value Ref Range Status  07/03/2024 4.8 3.5 - 5.1 mmol/L Final    Comment:    HEMOLYSIS AT THIS LEVEL MAY AFFECT RESULT   Sodium  Date Value Ref Range Status  07/03/2024 135 135 - 145 mmol/L Final  08/17/2021 140 134 - 144 mmol/L Final   B Natriuretic Peptide  Date Value Ref Range Status  03/19/2024 1,635.7 (H) 0.0 - 100.0 pg/mL Final    Comment:    Performed at [facility], [address] [place], [place] [address]   Magnesium   Date Value Ref Range Status  07/02/2024 2.2 1.7 - 2.4 mg/dL Final    Comment:    Performed at [facility], [address] [place], [place] [address]   Hgb A1c MFr Bld  Date Value Ref Range Status  06/30/2024 7.2 (H) 4.8 - 5.6 % Final    Comment:    (NOTE) Diagnosis of Diabetes The following HbA1c ranges recommended by the American Diabetes Association (ADA) may be used as an aid in the diagnosis of diabetes mellitus.  Hemoglobin             Suggested A1C NGSP%              Diagnosis  <5.7                   Non Diabetic  5.7-6.4                Pre-Diabetic  >6.4  Diabetic  <7.0                   Glycemic control for                       adults with diabetes.     TSH  Date Value Ref Range Status  04/23/2021 5.958 (H) 0.350 - 4.500 uIU/mL Final    Comment:    Performed by a 3rd Generation assay with a functional sensitivity of <=0.01 uIU/mL. Performed at [facility], [address] [place], [place] [address]     Vital Signs: Temp:  [97.6 F (36.4 C)-98.4 F (36.9 C)] 97.7 F (36.5 C) (01/29 1100) Pulse Rate:  [65-73] 69 (01/29 1100) Cardiac Rhythm: Normal sinus rhythm;Heart block (01/29 0700) Resp:  [17-19] 19 (01/29 0816) BP: (108-127)/(58-65) 120/60 (01/29 1100) SpO2:  [93 %-97 %] 97 % (01/29 1100) Weight:  [59.8 kg (131 lb 13.4 oz)] 59.8 kg (131 lb 13.4 oz) (01/29  0500)  Intake/Output Summary (Last 24 hours) at 07/03/2024 1225 Last data filed at 07/03/2024 1147 Gross per 24 hour  Intake 480 ml  Output 2100 ml  Net -1620 ml    Current Heart Failure Medications:  Loop diuretic: torsemide  20 mg daily Beta-Blocker: metoprolol  succinate 12.5 mg daily ACEI/ARB/ARNI: none MRA: spironolactone  25 mg daily SGLT2i: Jardiance  25 mg daily Other: isosorbide  mononitrate 15 mg daily  Prior to admission Heart Failure Medications:  Loop diuretic: torsemide  20 mg daily Beta-Blocker: metoprolol  succinate 12.5 mg daily ACEI/ARB/ARNI: none MRA: spironolactone  25 mg daily SGLT2i: Jardiance  25 mg daily Other: isosorbide  mononitrate 15 mg daily, nitroglycerin  0.4 SL tab prn  Assessment: 1. Acute on chronic combined systolic and diastolic heart failure (LVEF 35-40%) LV regional wall abnormalities, mild concentric LVH, grade III diastolic dysfunction in 05/2024, due to ICM. NYHA class III symptoms.    Continues to report anxiety over being discharged home alone, does not feel like he can care for himself. States he is worried about dying in his home alone and no one knowing. Reports this is a longstanding fear over the past few years. Reports thoughts of harming himself. Reports he has a loaded handgun in his nightstand with 6 bullets at home. Shortly after stating this, the patient changed the topic. Attending MD, patient's RN, and charge nurse were all immediately informed so that the appropriate actions can be taken.  -Symptoms: Appetite is good and states he is eating more than usual. Reports difficulty grocery shopping and cooking for himself at home. Reports cough and chest pain associated with cough. Denies SOB, orthopnea, edema. Reports leaving hearing aid charger at home, currently having difficulty hearing. Reports significant cataracts that limit his ability to read medication bottles and does not feel safe taking his medications. -Volume: No LEE. Down 12.4 kg  since admit. Reports decreased urine output. Consider holding torsemide  given worsening renal function and euvolemia. When creatinine starts to improve, can transition to furosemide  20 mg daily to be discharged home on. Hypervolemia on admission was likely more due to non adherence than diuretic resistance. -Hemodynamics: BP low to normal, HR 60-80s. -BB: Continue metoprolol  succinate 12.5 mg daily. HR stable. -ACEI/ARB/ARNI: Can consider adding losartan 12.5 mg daily when renal function is stable, baseline creatine is ~1.8.   -MRA: Continue spironolactone  25 mg daily. Scr 1.95, K 4.8. -SGLT2i: Continue Jardiance  25 mg daily. A1c 7.2%. No urinary symptoms or urinary catheter.  Plan: 1) Medication changes recommended at this time: - Consider  holding diuretics today, then decreasing to furosemide  20 mg daily.   2) Patient assistance: - Consider Pill-Packing given patient has difficulty reading medication bottles and does not feel safe taking his medications.  3) Education: - Patient has been educated on current HF medications and potential additions to HF medication regimen - Patient verbalizes understanding that over the next few months, these medication doses may change and more medications may be added to optimize HF regimen - Patient has been educated on basic disease state pathophysiology and goals of therapy   Patient seen alongside Calton Nash, [place] PharmD Candidate   Please do not hesitate to reach out with questions or concerns,   Jaun Bash, PharmD, CPP, BCPS, BCCP Heart Failure Pharmacist  Phone - [phone]

## 2024-07-03 NOTE — Progress Notes (Signed)
 Occupational Therapy Treatment Patient Details Name: Thomas Moore MRN: [id number] DOB: March 25, 1956 Today's Date: 07/03/2024   History of present illness Thomas Moore is a 69 y.o. male with a past medical history of CAD s/p CABG (1997), chronic HFrEF (30-35% - 08/2023), paroxysmal atrial fibrillation (on Eliquis ), HTN, HLD, DM, CVA, SVT, CKD stage IIIa, aortic stenosis s/p TAVR, history of thrombocytopenia, bilateral carotid artery stenosis, s/p loop recorder implant  who presented to the ED on 06/29/24 with acute onset of worsening dyspnea, LE edema, and orthopnea; admitted for management of acute/chronic CHF secondary to multifocal PNA.   OT comments  Pt is seated on EOB on arrival. Pleasant and agreeable to OT session. He denies pain. Pt performed STS from EOB to RW with CGA/SBA and demo ADL transfer ~10 ft to bathroom to perform ADL session. UB bathing performed with set up assist while seated on BSC in front of sink for safety. Min A for UB dressing to change out gowns. CGA for LB bathing of peri-region in standing with no LOB. Pt then ambulated additional 160 ft around nursing station prior to return to room d/t fatigue. He continues to express concerns about returning home alone. Pt left seated in recliner with all needs in place and will cont to require skilled acute OT services to maximize his safety and IND to return to PLOF.       If plan is discharge home, recommend the following:  A little help with walking and/or transfers;A little help with bathing/dressing/bathroom;Assist for transportation;Assistance with cooking/housework;Help with stairs or ramp for entrance   Equipment Recommendations  BSC/3in1;Tub/shower bench    Recommendations for Other Services      Precautions / Restrictions Precautions Precautions: Fall Recall of Precautions/Restrictions: Impaired Restrictions Weight Bearing Restrictions Per Provider Order: No       Mobility Bed Mobility                General bed mobility comments: seated EOB on entry    Transfers Overall transfer level: Needs assistance Equipment used: Rolling walker (2 wheels) Transfers: Sit to/from Stand Sit to Stand: Supervision           General transfer comment: supervision for STS from EOB and CGA/SBA for ambulation 160 ft around nurses station     Balance Overall balance assessment: Needs assistance Sitting-balance support: No upper extremity supported, Feet supported Sitting balance-Leahy Scale: Good     Standing balance support: Bilateral upper extremity supported, During functional activity Standing balance-Leahy Scale: Good Standing balance comment: RW                           ADL either performed or assessed with clinical judgement   ADL Overall ADL's : Needs assistance/impaired     Grooming: Wash/dry hands;Wash/dry face;Standing;Supervision/safety;Contact guard assist Grooming Details (indicate cue type and reason): standing at sink in bathroom with BSC behind pt for safety Upper Body Bathing: Sitting;Set up   Lower Body Bathing: Contact guard assist;Sit to/from stand Lower Body Bathing Details (indicate cue type and reason): bathe peri-region in standing at sink Upper Body Dressing : Minimal assistance;Standing Upper Body Dressing Details (indicate cue type and reason): change out gowns     Toilet Transfer: Contact guard assist;Cueing for safety;Ambulation;BSC/3in1;Rolling walker (2 wheels) Toilet Transfer Details (indicate cue type and reason): from [facility] for bathing tasks at sink         Functional mobility during ADLs: Contact guard assist;Rolling walker (2 wheels);Cueing for safety  Extremity/Trunk Assessment              Occupational Psychologist Communication: No apparent difficulties Factors Affecting Communication: Hearing impaired   Cognition Arousal: Alert Behavior During Therapy: WFL for tasks  assessed/performed                                 Following commands: Intact        Cueing   Cueing Techniques: Verbal cues  Exercises Other Exercises Other Exercises: Pt continues to express concerns about returning home without assist and difficulty getting to/from appts as his ex wife still works and he reports she is not a reliable source of transportation.    Shoulder Instructions       General Comments      Pertinent Vitals/ Pain       Pain Assessment Pain Assessment: No/denies pain  Home Living                                          Prior Functioning/Environment              Frequency  Min 2X/week        Progress Toward Goals  OT Goals(current goals can now be found in the care plan section)  Progress towards OT goals: Progressing toward goals  Acute Rehab OT Goals Patient Stated Goal: get better OT Goal Formulation: With patient Time For Goal Achievement: 07/15/24 Potential to Achieve Goals: Good  Plan      Co-evaluation                 AM-PAC OT 6 Clicks Daily Activity     Outcome Measure   Help from another person eating meals?: None Help from another person taking care of personal grooming?: A Little Help from another person toileting, which includes using toliet, bedpan, or urinal?: A Little Help from another person bathing (including washing, rinsing, drying)?: A Lot Help from another person to put on and taking off regular upper body clothing?: A Little Help from another person to put on and taking off regular lower body clothing?: A Lot 6 Click Score: 17    End of Session Equipment Utilized During Treatment: Gait belt;Rolling walker (2 wheels)  OT Visit Diagnosis: Other abnormalities of gait and mobility (R26.89);Muscle weakness (generalized) (M62.81)   Activity Tolerance Patient tolerated treatment well   Patient Left with call bell/phone within reach;in chair;with chair alarm set    Nurse Communication Mobility status        Time: 8472-8452 OT Time Calculation (min): 20 min  Charges: OT General Charges $OT Visit: 1 Visit OT Treatments $Self Care/Home Management : 8-22 mins  Moesha Sarchet Chrismon, OTR/L  07/03/24, 5:00 PM   Rashema Seawright E Chrismon 07/03/2024, 4:57 PM

## 2024-07-03 NOTE — Plan of Care (Signed)
   Problem: Education: Goal: Ability to describe self-care measures that may prevent or decrease complications (Diabetes Survival Skills Education) will improve Outcome: Progressing Goal: Individualized Educational Video(s) Outcome: Progressing   Problem: Coping: Goal: Ability to adjust to condition or change in health will improve Outcome: Progressing   Problem: Fluid Volume: Goal: Ability to maintain a balanced intake and output will improve Outcome: Progressing   Problem: Health Behavior/Discharge Planning: Goal: Ability to identify and utilize available resources and services will improve Outcome: Progressing Goal: Ability to manage health-related needs will improve Outcome: Progressing   Problem: Metabolic: Goal: Ability to maintain appropriate glucose levels will improve Outcome: Progressing   Problem: Nutritional: Goal: Maintenance of adequate nutrition will improve Outcome: Progressing Goal: Progress toward achieving an optimal weight will improve Outcome: Progressing   Problem: Skin Integrity: Goal: Risk for impaired skin integrity will decrease Outcome: Progressing   Problem: Tissue Perfusion: Goal: Adequacy of tissue perfusion will improve Outcome: Progressing   Problem: Education: Goal: Knowledge of General Education information will improve Description: Including pain rating scale, medication(s)/side effects and non-pharmacologic comfort measures Outcome: Progressing   Problem: Health Behavior/Discharge Planning: Goal: Ability to manage health-related needs will improve Outcome: Progressing   Problem: Clinical Measurements: Goal: Ability to maintain clinical measurements within normal limits will improve Outcome: Progressing Goal: Will remain free from infection Outcome: Progressing Goal: Diagnostic test results will improve Outcome: Progressing Goal: Respiratory complications will improve Outcome: Progressing Goal: Cardiovascular complication will  be avoided Outcome: Progressing   Problem: Activity: Goal: Risk for activity intolerance will decrease Outcome: Progressing   Problem: Nutrition: Goal: Adequate nutrition will be maintained Outcome: Progressing   Problem: Coping: Goal: Level of anxiety will decrease Outcome: Progressing   Problem: Elimination: Goal: Will not experience complications related to bowel motility Outcome: Progressing Goal: Will not experience complications related to urinary retention Outcome: Progressing   Problem: Pain Managment: Goal: General experience of comfort will improve and/or be controlled Outcome: Progressing   Problem: Safety: Goal: Ability to remain free from injury will improve Outcome: Progressing   Problem: Skin Integrity: Goal: Risk for impaired skin integrity will decrease Outcome: Progressing   Problem: Education: Goal: Ability to demonstrate management of disease process will improve Outcome: Progressing Goal: Ability to verbalize understanding of medication therapies will improve Outcome: Progressing Goal: Individualized Educational Video(s) Outcome: Progressing   Problem: Activity: Goal: Capacity to carry out activities will improve Outcome: Progressing   Problem: Cardiac: Goal: Ability to achieve and maintain adequate cardiopulmonary perfusion will improve Outcome: Progressing   Problem: Activity: Goal: Ability to tolerate increased activity will improve Outcome: Progressing   Problem: Clinical Measurements: Goal: Ability to maintain a body temperature in the normal range will improve Outcome: Progressing   Problem: Respiratory: Goal: Ability to maintain adequate ventilation will improve Outcome: Progressing Goal: Ability to maintain a clear airway will improve Outcome: Progressing

## 2024-07-03 NOTE — Progress Notes (Signed)
 Physical Therapy Treatment Patient Details Name: Thomas Moore MRN: [id number] DOB: [birth year] Today's Date: 07/03/2024   History of Present Illness Thomas Moore is a 69 y.o. male with a past medical history of CAD s/p CABG (1997), chronic HFrEF (30-35% - 08/2023), paroxysmal atrial fibrillation (on Eliquis ), HTN, HLD, DM, CVA, SVT, CKD stage IIIa, aortic stenosis s/p TAVR, history of thrombocytopenia, bilateral carotid artery stenosis, s/p loop recorder implant  who presented to the ED on 06/29/24 with acute onset of worsening dyspnea, LE edema, and orthopnea; admitted for management of acute/chronic CHF secondary to multifocal PNA.    PT Comments  With education and encouragement pt willing to participate with PT services and put forth good effort during the session.  Pt required min extra time and effort with bed mobility tasks but no physical assistance.  Pt demonstrated good eccentric and concentric control and stability during transfer training from various height surfaces and needed no cuing for sequencing.  Pt was steady with gait including during start/stops and 90 deg turns and was able to open closed doors while using the RW without assist or instability.  Amb distance limited this session due to pt needing to have a BM at end of session.  Pt will benefit from continued PT services upon discharge to safely address deficits listed in patient problem list for decreased caregiver assistance and eventual return to PLOF.     If plan is discharge home, recommend the following: A little help with walking and/or transfers;A little help with bathing/dressing/bathroom   Can travel by private vehicle        Equipment Recommendations  None recommended by PT    Recommendations for Other Services       Precautions / Restrictions Precautions Precautions: Fall Restrictions Weight Bearing Restrictions Per Provider Order: No     Mobility  Bed Mobility Overal bed mobility: Modified  Independent             General bed mobility comments: Min extra time and effort only    Transfers Overall transfer level: Needs assistance Equipment used: Rolling walker (2 wheels) Transfers: Sit to/from Stand Sit to Stand: Supervision           General transfer comment: Good eccentric and concentric control and stability from multiple height surfaces with no cuing needed for sequencing    Ambulation/Gait Ambulation/Gait assistance: Supervision Gait Distance (Feet): 20 Feet Assistive device: Rolling walker (2 wheels) Gait Pattern/deviations: Step-through pattern, Decreased step length - right, Decreased step length - left Gait velocity: decreased     General Gait Details: Mildly reduced cadence but steady with no overt LOB or adverse symptoms; distance limited due to pt needing to have a BM   Stairs             Wheelchair Mobility     Tilt Bed    Modified Rankin (Stroke Patients Only)       Balance Overall balance assessment: Needs assistance   Sitting balance-Leahy Scale: Good     Standing balance support: Bilateral upper extremity supported, During functional activity Standing balance-Leahy Scale: Good                              Communication Communication Communication: No apparent difficulties Factors Affecting Communication: Hearing impaired  Cognition Arousal: Alert Behavior During Therapy: WFL for tasks assessed/performed   PT - Cognitive impairments: No apparent impairments  Following commands: Intact      Cueing Cueing Techniques: Verbal cues  Exercises Other Exercises Other Exercises: Pt education provided on physiological benefits of activity and benefits of OOB to chair Unsupported sitting at EOB with seated therex x 6 min     General Comments        Pertinent Vitals/Pain Pain Assessment Pain Assessment: No/denies pain    Home Living                           Prior Function            PT Goals (current goals can now be found in the care plan section) Progress towards PT goals: Progressing toward goals    Frequency    Min 2X/week      PT Plan      Co-evaluation              AM-PAC PT 6 Clicks Mobility   Outcome Measure  Help needed turning from your back to your side while in a flat bed without using bedrails?: None Help needed moving from lying on your back to sitting on the side of a flat bed without using bedrails?: None Help needed moving to and from a bed to a chair (including a wheelchair)?: None Help needed standing up from a chair using your arms (e.g., wheelchair or bedside chair)?: A Little Help needed to walk in hospital room?: A Little Help needed climbing 3-5 steps with a railing? : A Little 6 Click Score: 21    End of Session Equipment Utilized During Treatment: Gait belt Activity Tolerance: Patient tolerated treatment well Patient left: Other (comment) (Pt left in bathroom for BM with instructions to pull cord when done, nursing notified) Nurse Communication: Mobility status;Other (comment) (nsg notified that pt left in BR for BM) PT Visit Diagnosis: Muscle weakness (generalized) (M62.81);Difficulty in walking, not elsewhere classified (R26.2)     Time: 1351-1411 PT Time Calculation (min) (ACUTE ONLY): 20 min  Charges:    $Therapeutic Activity: 8-22 mins PT General Charges $$ ACUTE PT VISIT: 1 Visit                     D. Glendia Bertin PT, DPT 07/03/24, 2:29 PM

## 2024-07-03 NOTE — Plan of Care (Signed)
 " Problem: Education: Goal: Ability to describe self-care measures that may prevent or decrease complications (Diabetes Survival Skills Education) will improve 07/03/2024 1835 by Gwenn Prentice BIRCH, RN Outcome: Progressing 07/03/2024 1835 by Gwenn Prentice BIRCH, RN Outcome: Progressing Goal: Individualized Educational Video(s) 07/03/2024 1835 by Gwenn Prentice BIRCH, RN Outcome: Progressing 07/03/2024 1835 by Gwenn Prentice BIRCH, RN Outcome: Progressing   Problem: Coping: Goal: Ability to adjust to condition or change in health will improve 07/03/2024 1835 by Gwenn Prentice BIRCH, RN Outcome: Progressing 07/03/2024 1835 by Gwenn Prentice BIRCH, RN Outcome: Progressing   Problem: Fluid Volume: Goal: Ability to maintain a balanced intake and output will improve 07/03/2024 1835 by Gwenn Prentice BIRCH, RN Outcome: Progressing 07/03/2024 1835 by Gwenn Prentice BIRCH, RN Outcome: Progressing   Problem: Health Behavior/Discharge Planning: Goal: Ability to identify and utilize available resources and services will improve 07/03/2024 1835 by Gwenn Prentice BIRCH, RN Outcome: Progressing 07/03/2024 1835 by Gwenn Prentice BIRCH, RN Outcome: Progressing Goal: Ability to manage health-related needs will improve 07/03/2024 1835 by Gwenn Prentice BIRCH, RN Outcome: Progressing 07/03/2024 1835 by Gwenn Prentice BIRCH, RN Outcome: Progressing   Problem: Metabolic: Goal: Ability to maintain appropriate glucose levels will improve 07/03/2024 1835 by Gwenn Prentice BIRCH, RN Outcome: Progressing 07/03/2024 1835 by Gwenn Prentice BIRCH, RN Outcome: Progressing   Problem: Nutritional: Goal: Maintenance of adequate nutrition will improve 07/03/2024 1835 by Gwenn Prentice BIRCH, RN Outcome: Progressing 07/03/2024 1835 by Gwenn Prentice BIRCH, RN Outcome: Progressing Goal: Progress toward achieving an optimal weight will improve 07/03/2024 1835 by Gwenn Prentice BIRCH, RN Outcome: Progressing 07/03/2024 1835 by Gwenn Prentice BIRCH, RN Outcome:  Progressing   Problem: Skin Integrity: Goal: Risk for impaired skin integrity will decrease 07/03/2024 1835 by Gwenn Prentice BIRCH, RN Outcome: Progressing 07/03/2024 1835 by Gwenn Prentice BIRCH, RN Outcome: Progressing   Problem: Tissue Perfusion: Goal: Adequacy of tissue perfusion will improve 07/03/2024 1835 by Gwenn Prentice BIRCH, RN Outcome: Progressing 07/03/2024 1835 by Gwenn Prentice BIRCH, RN Outcome: Progressing   Problem: Education: Goal: Knowledge of General Education information will improve Description: Including pain rating scale, medication(s)/side effects and non-pharmacologic comfort measures 07/03/2024 1835 by Gwenn Prentice BIRCH, RN Outcome: Progressing 07/03/2024 1835 by Gwenn Prentice BIRCH, RN Outcome: Progressing   Problem: Health Behavior/Discharge Planning: Goal: Ability to manage health-related needs will improve 07/03/2024 1835 by Gwenn Prentice BIRCH, RN Outcome: Progressing 07/03/2024 1835 by Gwenn Prentice BIRCH, RN Outcome: Progressing   Problem: Clinical Measurements: Goal: Ability to maintain clinical measurements within normal limits will improve 07/03/2024 1835 by Gwenn Prentice BIRCH, RN Outcome: Progressing 07/03/2024 1835 by Gwenn Prentice BIRCH, RN Outcome: Progressing Goal: Will remain free from infection 07/03/2024 1835 by Gwenn Prentice BIRCH, RN Outcome: Progressing 07/03/2024 1835 by Gwenn Prentice BIRCH, RN Outcome: Progressing Goal: Diagnostic test results will improve 07/03/2024 1835 by Gwenn Prentice BIRCH, RN Outcome: Progressing 07/03/2024 1835 by Gwenn Prentice BIRCH, RN Outcome: Progressing Goal: Respiratory complications will improve 07/03/2024 1835 by Gwenn Prentice BIRCH, RN Outcome: Progressing 07/03/2024 1835 by Gwenn Prentice BIRCH, RN Outcome: Progressing Goal: Cardiovascular complication will be avoided 07/03/2024 1835 by Gwenn Prentice BIRCH, RN Outcome: Progressing 07/03/2024 1835 by Gwenn Prentice BIRCH, RN Outcome: Progressing   Problem: Activity: Goal: Risk  for activity intolerance will decrease 07/03/2024 1835 by Gwenn Prentice BIRCH, RN Outcome: Progressing 07/03/2024 1835 by Gwenn Prentice BIRCH, RN Outcome: Progressing   Problem: Nutrition: Goal: Adequate nutrition will be maintained 07/03/2024 1835 by Gwenn Prentice BIRCH, RN Outcome: Progressing 07/03/2024 1835 by Gwenn Prentice BIRCH, RN Outcome: Progressing  Problem: Coping: Goal: Level of anxiety will decrease 07/03/2024 1835 by Gwenn Prentice BIRCH, RN Outcome: Progressing 07/03/2024 1835 by Gwenn Prentice BIRCH, RN Outcome: Progressing   Problem: Elimination: Goal: Will not experience complications related to bowel motility 07/03/2024 1835 by Gwenn Prentice BIRCH, RN Outcome: Progressing 07/03/2024 1835 by Gwenn Prentice BIRCH, RN Outcome: Progressing Goal: Will not experience complications related to urinary retention 07/03/2024 1835 by Gwenn Prentice BIRCH, RN Outcome: Progressing 07/03/2024 1835 by Gwenn Prentice BIRCH, RN Outcome: Progressing   Problem: Pain Managment: Goal: General experience of comfort will improve and/or be controlled 07/03/2024 1835 by Gwenn Prentice BIRCH, RN Outcome: Progressing 07/03/2024 1835 by Gwenn Prentice BIRCH, RN Outcome: Progressing   Problem: Safety: Goal: Ability to remain free from injury will improve 07/03/2024 1835 by Gwenn Prentice BIRCH, RN Outcome: Progressing 07/03/2024 1835 by Gwenn Prentice BIRCH, RN Outcome: Progressing   Problem: Skin Integrity: Goal: Risk for impaired skin integrity will decrease 07/03/2024 1835 by Gwenn Prentice BIRCH, RN Outcome: Progressing 07/03/2024 1835 by Gwenn Prentice BIRCH, RN Outcome: Progressing   Problem: Education: Goal: Ability to demonstrate management of disease process will improve 07/03/2024 1835 by Gwenn Prentice BIRCH, RN Outcome: Progressing 07/03/2024 1835 by Gwenn Prentice BIRCH, RN Outcome: Progressing Goal: Ability to verbalize understanding of medication therapies will improve 07/03/2024 1835 by Gwenn Prentice BIRCH, RN Outcome:  Progressing 07/03/2024 1835 by Gwenn Prentice BIRCH, RN Outcome: Progressing Goal: Individualized Educational Video(s) 07/03/2024 1835 by Gwenn Prentice BIRCH, RN Outcome: Progressing 07/03/2024 1835 by Gwenn Prentice BIRCH, RN Outcome: Progressing   Problem: Activity: Goal: Capacity to carry out activities will improve 07/03/2024 1835 by Gwenn Prentice BIRCH, RN Outcome: Progressing 07/03/2024 1835 by Gwenn Prentice BIRCH, RN Outcome: Progressing   Problem: Cardiac: Goal: Ability to achieve and maintain adequate cardiopulmonary perfusion will improve 07/03/2024 1835 by Gwenn Prentice BIRCH, RN Outcome: Progressing 07/03/2024 1835 by Gwenn Prentice BIRCH, RN Outcome: Progressing   Problem: Activity: Goal: Ability to tolerate increased activity will improve 07/03/2024 1835 by Gwenn Prentice BIRCH, RN Outcome: Progressing 07/03/2024 1835 by Gwenn Prentice BIRCH, RN Outcome: Progressing   Problem: Clinical Measurements: Goal: Ability to maintain a body temperature in the normal range will improve 07/03/2024 1835 by Gwenn Prentice BIRCH, RN Outcome: Progressing 07/03/2024 1835 by Gwenn Prentice BIRCH, RN Outcome: Progressing   Problem: Respiratory: Goal: Ability to maintain adequate ventilation will improve 07/03/2024 1835 by Gwenn Prentice BIRCH, RN Outcome: Progressing 07/03/2024 1835 by Gwenn Prentice BIRCH, RN Outcome: Progressing Goal: Ability to maintain a clear airway will improve 07/03/2024 1835 by Gwenn Prentice BIRCH, RN Outcome: Progressing 07/03/2024 1835 by Gwenn Prentice BIRCH, RN Outcome: Progressing   "

## 2024-07-03 NOTE — Progress Notes (Signed)
 " Progress Note   Patient: Thomas Moore FMW:[id number] DOB: 1955/06/26 DOA: 06/29/2024  DOS: the patient was seen and examined on 07/03/2024   Brief hospital course:  69 y.o. male with medical history significant for coronary artery disease status post four-vessel CABG, CHF, COPD, CVA, type 2 diabetes mellitus, GERD, hypertension and dyslipidemia, seizure disorder and status post TAVR, who presented to the emergency room with acute onset of worsening dyspnea over the last couple of weeks that deteriorated over the last couple of days.    Assessment and Plan:   Acute hypoxic respiratory failure 2/2 chf, now weaned off o2   Acute exacerbation of HFrEF - Worsening orthopnea, DOE, dyspnea prior to admission.  proBNP greater than 20,000.  Chest x-ray personally reviewed noting congestion.  Responded well to iv lasix , now transitioned to orals. Continue GDMT with Jardiance , metoprolol , spironolactone , torsemide .   Concern for multifocal pneumonia - CT imaging showing patchy opacities, edema versus pneumonia.  Patient not complaining of purulent sputum, fever.  No leukocytosis.  Was given empiric antibiotics.  Abx discontinued. Monitor respiratory function.  Acute kidney injury on ckd 3b Resolved kidney function now at baseline of around 1.9   Paroxysmal atrial fibrillation Hx PE - Eliquis  and amiodarone  on board.  Continues on telemetry.   Coronary artery disease - Continue Plavix , statin.   Diabetes mellitus - Insulin  sliding scale on board.  Insulin  glargine 10 units daily.   Hypothyroidism - Synthroid  on board.   Seizure disorder - Depakote .   Physical debilitation muscle weakness - difficulty caring for self at home and has no supports, just an ex wife. Doesn't qualify for snf and regardless that would only be a short-term help. TOC looking into a group home. Ex-wife reportedly has offered for him to come to her home on a temporary basis   Subjective: reports no pain or  dyspnea, tolerating diet, BM yesterday  Physical Exam:  Vitals:   07/03/24 0354 07/03/24 0500 07/03/24 0816 07/03/24 1100  BP: 127/64  126/65 120/60  Pulse: 65  69 69  Resp: 18  19   Temp: 97.8 F (36.6 C)  97.9 F (36.6 C) 97.7 F (36.5 C)  TempSrc:    Oral  SpO2: 93%  97% 97%  Weight:  59.8 kg    Height:        GENERAL:  Alert, pleasant, no acute distress  HEENT:  EOMI, nasal cannula CARDIOVASCULAR: Irregularly irregular, rr RESPIRATORY: rales at bases, no tachypnea GASTROINTESTINAL:  Soft, nontender, nondistended EXTREMITIES:  No LE edema bilaterally NEURO:  No new focal deficits appreciated SKIN:  No rashes noted PSYCH:  Appropriate mood and affect, anxious   Data Reviewed:  Imaging Studies: CT Angio Chest PE W/Cm &/Or Wo Cm Result Date: 06/29/2024 EXAM: CTA of the Chest with contrast for PE 06/29/2024 09:33:08 PM TECHNIQUE: CTA of the chest was performed after the administration of 75 mL of iohexol  (OMNIPAQUE ) 350 MG/ML injection. Multiplanar reformatted images are provided for review. MIP images are provided for review. Automated exposure control, iterative reconstruction, and/or weight based adjustment of the mA/kV was utilized to reduce the radiation dose to as low as reasonably achievable. COMPARISON: Chest radiograph 06/29/2024 and CT chest 01/17/2024. CLINICAL HISTORY: Pulmonary embolism (PE) suspected, high prob. Shortness of breath and chest pain. Cough for 1.5 weeks. FINDINGS: PULMONARY ARTERIES: Pulmonary arteries are adequately opacified for evaluation. No pulmonary embolism. Main pulmonary artery is normal in caliber. MEDIASTINUM: Cardiac enlargement. Aortic valve replacement. Coronary artery bypass. No pericardial effusions. Calcification  of the aorta without aneurysm. The thyroid  gland is unremarkable. Esophagus is decompressed. Postoperative changes with median sternotomy. LYMPH NODES: Prominent mediastinal lymph nodes with pretracheal lymph nodes measuring up to  about 1.3 cm diameter. Lymphadenopathy is similar to the prior study, most likely reactive. No axillary lymphadenopathy. LUNGS AND PLEURA: Moderate bilateral pleural effusions. Atelectasis or consolidation in the lung bases. Peribronchial thickening with mucous plugging. Patchy ground glass infiltrates throughout the lungs with a mosaic pattern likely representing edema or possibly multifocal pneumonia. No pneumothorax. UPPER ABDOMEN: Intrarenal stone on the left measuring 6 mm in diameter. No hydronephrosis. SOFT TISSUES AND BONES: Degenerative changes in the spine. No acute bony abnormalities. No acute soft tissue abnormality. IMPRESSION: 1. No evidence of pulmonary embolism. 2. Moderate bilateral pleural effusions. 3. Patchy ground glass infiltrates throughout the lungs with a mosaic pattern, likely representing edema or possibly multifocal pneumonia. 4. Bibasilar atelectasis or consolidation. 5. Peribronchial thickening with mucous plugging, which can be seen with bronchitis. Electronically signed by: Elsie Gravely MD 06/29/2024 09:40 PM EST RP Workstation: [id number]   DG Chest 2 View Result Date: 06/29/2024 EXAM: 2 VIEW(S) XRAY OF THE CHEST 06/29/2024 06:58:00 PM COMPARISON: 05/19/2024 CLINICAL HISTORY: Shortness of breath. FINDINGS: LUNGS AND PLEURA: Mild pulmonary edema. Patchy airspace opacities in lung bases. Small bilateral pleural effusions. No pneumothorax. HEART AND MEDIASTINUM: Cardiomegaly. TAVR stent in place. CABG markers and sternotomy wires noted. Cardiac loop recorder device in place. BONES AND SOFT TISSUES: No acute osseous abnormality. IMPRESSION: 1. Mild pulmonary edema with patchy bibasilar airspace opacities. 2. Small bilateral pleural effusions. 3. Cardiomegaly with TAVR stent, postoperative CABG changes, and cardiac loop recorder. Electronically signed by: Elsie Gravely MD 06/29/2024 07:01 PM EST RP Workstation: [id number]   CUP PACEART REMOTE DEVICE CHECK Result Date:  06/24/2024 ILR summary report received. Battery status OK. Normal device function. No new symptom, tachy, brady, or pause episodes. No new AF episodes. Monthly summary reports and ROV/PRN LA CVRS   There are no new results to review at this time.  Previous records (including but not limited to H&P, progress notes, nursing notes, TOC management) were reviewed in assessment of this patient.  Labs: CBC: Recent Labs  Lab 06/29/24 1934 06/30/24 0330 07/01/24 0300 07/02/24 0517  WBC 7.3 5.5 4.3 4.5  NEUTROABS 6.1  --   --   --   HGB 9.4* 8.7* 9.6* 9.5*  HCT 31.8* 29.2* 32.5* 31.5*  MCV 88.8 88.8 88.3 87.0  PLT 154 109* 134* 147*   Basic Metabolic Panel: Recent Labs  Lab 06/29/24 1934 06/30/24 0330 07/01/24 0300 07/02/24 0517 07/03/24 0819  NA 135 138 142 138 135  K 4.1 3.7 4.4 3.8 4.8  CL 99 103 102 96* 95*  CO2 20* 22 30 30 27   GLUCOSE 285* 144* 111* 143* 156*  BUN 31* 29* 32* 36* 42*  CREATININE 1.51* 1.48* 1.82* 1.90* 1.95*  CALCIUM  8.4* 8.3* 9.1 8.9 9.3  MG  --   --  2.1 2.2  --    Liver Function Tests: No results for input(s): AST, ALT, ALKPHOS, BILITOT, PROT, ALBUMIN in the last 168 hours. CBG: Recent Labs  Lab 07/02/24 1659 07/02/24 1923 07/02/24 2209 07/03/24 0817 07/03/24 1146  GLUCAP 136* 132* 198* 155* 187*    Scheduled Meds:  amiodarone   200 mg Oral Daily   apixaban   2.5 mg Oral BID   atorvastatin   80 mg Oral QHS   calcium  citrate  1 tablet Oral BID   Cenegermin -bkbj  1 drop Both Eyes  6 X Daily   clopidogrel   75 mg Oral Daily   cyanocobalamin   1,000 mcg Oral Daily   cyclopentolate   1 drop Left Eye BID   diclofenac  Sodium  4 g Topical QID   divalproex   250 mg Oral QHS   empagliflozin   25 mg Oral Daily   guaiFENesin   600 mg Oral BID   insulin  aspart  0-15 Units Subcutaneous TID WC   insulin  aspart  0-5 Units Subcutaneous QHS   insulin  glargine-yfgn  10 Units Subcutaneous Daily   isosorbide  mononitrate  15 mg Oral Daily    levothyroxine   50 mcg Oral Q0600   metoprolol  succinate  12.5 mg Oral Daily   multivitamin with minerals  1 tablet Oral Daily   spironolactone   25 mg Oral Daily   torsemide   20 mg Oral Daily   Continuous Infusions: PRN Meds:.acetaminophen  **OR** acetaminophen , chlorpheniramine-HYDROcodone , ipratropium-albuterol , magnesium  hydroxide, nitroGLYCERIN , traZODone   Family Communication: ex-wife tina updated telephonically 1/28  Disposition: Status is: Inpatient Remains inpatient appropriate because: dispo planning underway    Length of inpatient stay: 3 days  Author: Devaughn KATHEE Ban, MD 07/03/2024 2:51 PM  For on call review [email or web address].   "

## 2024-07-03 NOTE — Progress Notes (Signed)
 [facility] CLINIC CARDIOLOGY PROGRESS NOTE   Patient ID: Thomas Moore MRN: [id number] DOB/AGE: Jun 07, 1955 69 y.o.  Admit date: 06/29/2024 Referring Physician Dr. Arlon Primary Physician Lenon Layman ORN, MD  Primary Cardiologist Dr. Fernand Reason for Consultation AoCHF  HPI: Thomas Moore is a 69 y.o. male with a past medical history of coronary artery disease s/p CABG (1997), chronic HFrEF (30-35% - 08/2023), paroxysmal atrial fibrillation (on Eliquis ), hypertension, hyperlipidemia, diabetes, history stroke, history of SVT, CKD stage IIIa, aortic stenosis s/p TAVR, history of thrombocytopenia, bilateral carotid artery stenosis, s/p loop recorder implant who presented to the ED on 06/29/2024 for SOB. Cardiology was consulted for further evaluation.   Interval History:  -Patient seen and examined this AM, resting in bed eating breakfast. -Denies any shortness of breath or chest discomfort. -BP and HR stable.  No events noted on telemetry.  Review of systems complete and found to be negative unless listed above   Vitals:   07/02/24 2330 07/03/24 0354 07/03/24 0500 07/03/24 0816  BP: 118/63 127/64  126/65  Pulse: 69 65  69  Resp: 18 18  19   Temp: 98.4 F (36.9 C) 97.8 F (36.6 C)  97.9 F (36.6 C)  TempSrc:      SpO2: 95% 93%  97%  Weight:   59.8 kg   Height:         Intake/Output Summary (Last 24 hours) at 07/03/2024 9161 Last data filed at 07/03/2024 0600 Gross per 24 hour  Intake 720 ml  Output 1500 ml  Net -780 ml     PHYSICAL EXAM General: Chronically ill appearing male, well nourished, in no acute distress. HEENT: Normocephalic and atraumatic. Neck: No JVD.  Lungs: Normal respiratory effort on room air. Clear bilaterally to auscultation. No wheezes, crackles, rhonchi.  Heart: HRRR. Normal S1 and S2 without gallops or murmurs. Radial & DP pulses 2+ bilaterally. Abdomen: Non-distended appearing.  Msk: Normal strength and tone for age. Extremities: No clubbing,  cyanosis or edema.   Neuro: Alert and oriented X 3. Psych: Mood appropriate, affect congruent.    LABS: Basic Metabolic Panel: Recent Labs    07/01/24 0300 07/02/24 0517  NA 142 138  K 4.4 3.8  CL 102 96*  CO2 30 30  GLUCOSE 111* 143*  BUN 32* 36*  CREATININE 1.82* 1.90*  CALCIUM  9.1 8.9  MG 2.1 2.2   Liver Function Tests: No results for input(s): AST, ALT, ALKPHOS, BILITOT, PROT, ALBUMIN in the last 72 hours. No results for input(s): LIPASE, AMYLASE in the last 72 hours. CBC: Recent Labs    07/01/24 0300 07/02/24 0517  WBC 4.3 4.5  HGB 9.6* 9.5*  HCT 32.5* 31.5*  MCV 88.3 87.0  PLT 134* 147*   Cardiac Enzymes: No results for input(s): CKTOTAL, CKMB, CKMBINDEX, TROPONINIHS in the last 72 hours. BNP: No results for input(s): BNP in the last 72 hours. D-Dimer: No results for input(s): DDIMER in the last 72 hours. Hemoglobin A1C: No results for input(s): HGBA1C in the last 72 hours.  Fasting Lipid Panel: No results for input(s): CHOL, HDL, LDLCALC, TRIG, CHOLHDL, LDLDIRECT in the last 72 hours. Thyroid  Function Tests: No results for input(s): TSH, T4TOTAL, T3FREE, THYROIDAB in the last 72 hours.  Invalid input(s): FREET3 Anemia Panel: No results for input(s): VITAMINB12, FOLATE, FERRITIN, TIBC, IRON, RETICCTPCT in the last 72 hours.  No results found.    ECHO 05/2024: 1. Left ventricular ejection fraction, by estimation, is 35 to 40%. The left ventricle has moderately decreased  function. The left ventricle demonstrates regional wall motion abnormalities (see scoring diagram/findings for description). The left ventricular internal cavity size was mildly dilated. There is mild concentric left ventricular hypertrophy. Left ventricular diastolic  parameters are consistent with Grade III diastolic dysfunction (restrictive).   2. Right ventricular systolic function is mildly reduced. The right  ventricular size is mildly enlarged. Mildly increased right ventricular wall thickness.   3. The mitral valve is normal in structure. No evidence of mitral valve  regurgitation.   4. The aortic valve is normal in structure. Aortic valve regurgitation is  not visualized. Aortic valve sclerosis is present, with no evidence of  aortic valve stenosis.    TELEMETRY (personally reviewed): sinus rhythm rate 70s  EKG (personally reviewed): NSR rate 74 bpm  DATA reviewed by me 07/03/24: last 24h vitals tele labs imaging I/O, hospitalist progress note  Principal Problem:   Acute on chronic combined systolic (congestive) and diastolic (congestive) heart failure (HCC) Active Problems:   Paroxysmal atrial fibrillation (HCC)   Hypothyroidism   Dyslipidemia   Type 2 diabetes mellitus without complications (HCC)   Coronary artery disease   Seizure disorder (HCC)   Multifocal pneumonia    ASSESSMENT AND PLAN: [name] is a 69 y.o. male with a past medical history of coronary artery disease s/p CABG (1997), chronic HFrEF (30-35% - 08/2023), paroxysmal atrial fibrillation (on Eliquis ), hypertension, hyperlipidemia, diabetes, history stroke, history of SVT, CKD stage IIIa, aortic stenosis s/p TAVR, history of thrombocytopenia, bilateral carotid artery stenosis, s/p loop recorder implant who presented to the ED on 06/29/2024 for SOB. Cardiology was consulted for further evaluation.   # Acute on chronic HFrEF # Coronary artery disease s/p CABG # Paroxysmal atrial fibrillation # S/p TAVR Patient presented with worsening SOB/DOE for a few weeks. BNP elevated at 20,181. CXR with mild pulmonary edema. Started on IV lasix , net negative 5.8L today.  Some degree of medication noncompliance outpatient as he has issues seeing his medication bottles due to cataracts. -Continue PO torsemide  20 mg daily. Can consider increased dose if poor UOP. -Continue jardiance  25 mg daily and spironolactone  25 mg daily.  Consider addition of ARB outpatient if BP and renal function will tolerate. -Continue eliquis  5 mg twice daily.  -Continue amiodarone  200 mg daily.  -Continue atorvastatin  80 mg daily and plavix  75 mg daily.  -Continue imdur  15 mg daily and metoprolol  succinate 12.5 mg daily.  -Patient continues to express concern that he is unable to care for himself at home but likely does not qualify for SNF, would likely benefit from ALF. TOC is following.  This patient's case was discussed and created with Dr. Wilburn and he is in agreement.  Signed:  Danita Bloch, PA-C  07/03/2024, 8:38 AM [facility] Cardiology

## 2024-07-04 ENCOUNTER — Other Ambulatory Visit: Payer: Self-pay

## 2024-07-04 DIAGNOSIS — I5043 Acute on chronic combined systolic (congestive) and diastolic (congestive) heart failure: Secondary | ICD-10-CM | POA: Diagnosis not present

## 2024-07-04 LAB — BASIC METABOLIC PANEL WITH GFR
Anion gap: 12 (ref 5–15)
BUN: 53 mg/dL — ABNORMAL HIGH (ref 8–23)
CO2: 28 mmol/L (ref 22–32)
Calcium: 8.8 mg/dL — ABNORMAL LOW (ref 8.9–10.3)
Chloride: 92 mmol/L — ABNORMAL LOW (ref 98–111)
Creatinine, Ser: 2.03 mg/dL — ABNORMAL HIGH (ref 0.61–1.24)
GFR, Estimated: 35 mL/min — ABNORMAL LOW
Glucose, Bld: 207 mg/dL — ABNORMAL HIGH (ref 70–99)
Potassium: 4.4 mmol/L (ref 3.5–5.1)
Sodium: 133 mmol/L — ABNORMAL LOW (ref 135–145)

## 2024-07-04 LAB — MAGNESIUM: Magnesium: 2.3 mg/dL (ref 1.7–2.4)

## 2024-07-04 LAB — GLUCOSE, CAPILLARY
Glucose-Capillary: 148 mg/dL — ABNORMAL HIGH (ref 70–99)
Glucose-Capillary: 225 mg/dL — ABNORMAL HIGH (ref 70–99)
Glucose-Capillary: 156 mg/dL — ABNORMAL HIGH (ref 70–99)

## 2024-07-04 MED ORDER — OXERVATE 0.002 % OP SOLN
1.0000 [drp] | Freq: Every day | OPHTHALMIC | 1 refills | Status: AC
  Filled 2024-07-04: qty 1, fill #0

## 2024-07-04 MED ORDER — AMIODARONE HCL 100 MG PO TABS
200.0000 mg | ORAL_TABLET | Freq: Every day | ORAL | 1 refills | Status: AC
  Filled 2024-07-04: qty 30, 15d supply, fill #0

## 2024-07-04 MED ORDER — ISOSORBIDE MONONITRATE ER 30 MG PO TB24
15.0000 mg | ORAL_TABLET | Freq: Every day | ORAL | 1 refills | Status: AC
  Filled 2024-07-04: qty 45, 90d supply, fill #0

## 2024-07-04 MED ORDER — TORSEMIDE 10 MG PO TABS
20.0000 mg | ORAL_TABLET | Freq: Every day | ORAL | 0 refills | Status: AC
  Filled 2024-07-04: qty 90, 45d supply, fill #0

## 2024-07-04 MED ORDER — PEN NEEDLES 32G X 4 MM MISC
1.0000 | Freq: Every evening | 1 refills | Status: AC
  Filled 2024-07-04: qty 100, 100d supply, fill #0

## 2024-07-04 MED ORDER — LEVOTHYROXINE SODIUM 50 MCG PO TABS
50.0000 ug | ORAL_TABLET | Freq: Every day | ORAL | 1 refills | Status: AC
  Filled 2024-07-04: qty 90, 90d supply, fill #0

## 2024-07-04 MED ORDER — CLOPIDOGREL BISULFATE 75 MG PO TABS
75.0000 mg | ORAL_TABLET | Freq: Every day | ORAL | 1 refills | Status: AC
  Filled 2024-07-04: qty 90, 90d supply, fill #0

## 2024-07-04 MED ORDER — ATORVASTATIN CALCIUM 80 MG PO TABS
80.0000 mg | ORAL_TABLET | Freq: Every day | ORAL | 1 refills | Status: AC
  Filled 2024-07-04: qty 90, 90d supply, fill #0

## 2024-07-04 MED ORDER — METOPROLOL SUCCINATE ER 25 MG PO TB24
12.5000 mg | ORAL_TABLET | Freq: Every day | ORAL | 1 refills | Status: AC
  Filled 2024-07-04: qty 45, 90d supply, fill #0

## 2024-07-04 MED ORDER — JARDIANCE 25 MG PO TABS
25.0000 mg | ORAL_TABLET | Freq: Every day | ORAL | 1 refills | Status: AC
  Filled 2024-07-04: qty 90, 90d supply, fill #0

## 2024-07-04 MED ORDER — SPIRONOLACTONE 25 MG PO TABS
25.0000 mg | ORAL_TABLET | Freq: Every day | ORAL | 1 refills | Status: AC
  Filled 2024-07-04: qty 90, 90d supply, fill #0

## 2024-07-04 MED ORDER — INSULIN DEGLUDEC 100 UNIT/ML ~~LOC~~ SOPN
10.0000 [IU] | PEN_INJECTOR | Freq: Every day | SUBCUTANEOUS | 1 refills | Status: AC
  Filled 2024-07-04: qty 9, 90d supply, fill #0

## 2024-07-04 MED ORDER — APIXABAN 2.5 MG PO TABS
2.5000 mg | ORAL_TABLET | Freq: Two times a day (BID) | ORAL | 1 refills | Status: AC
  Filled 2024-07-04: qty 60, 30d supply, fill #0

## 2024-07-04 MED ORDER — DIVALPROEX SODIUM 250 MG PO DR TAB
250.0000 mg | DELAYED_RELEASE_TABLET | Freq: Every day | ORAL | 1 refills | Status: AC
  Filled 2024-07-04: qty 90, 90d supply, fill #0

## 2024-07-04 NOTE — Progress Notes (Signed)
 [facility] Cardiology  CARDIOLOGY PROGRESS NOTE  Patient ID: Thomas Moore MRN: [id number] DOB/AGE: 1955-07-24 69 y.o.  Admit date: 06/29/2024 Referring Physician Dr. Kandis Reason for Consultation heart failure  HPI: No complaints this morning.  Breathing improved.  Review of systems complete and found to be negative unless listed above     Past Medical History:  Diagnosis Date   CAD (coronary artery disease) of artery bypass graft    s/p CABG x 4 in 1997   CHF (congestive heart failure) (HCC)    COPD (chronic obstructive pulmonary disease) (HCC)    Coronary artery disease    CVA (cerebral vascular accident) (HCC)    Diabetes mellitus without complication (HCC)    Dysrhythmia    GERD (gastroesophageal reflux disease)    HLD (hyperlipidemia)    Hypertension    S/P TAVR (transcatheter aortic valve replacement) 07/05/2021   with Edwards 26mm S3UR via TF approach with Dr. Wonda and Dr. Lucas   Seizures [facility])     Past Surgical History:  Procedure Laterality Date   BYPASS GRAFT ANGIOGRAPHY N/A 04/25/2021   Procedure: BYPASS GRAFT ANGIOGRAPHY;  Surgeon: Lawyer Bernardino Cough, MD;  Location: [facility] INVASIVE CV LAB;  Service: Cardiovascular;  Laterality: N/A;   CARDIAC SURGERY     CORNEAL TRANSPLANT Right    CORONARY ARTERY BYPASS GRAFT  1997   ELECTROPHYSIOLOGY STUDY N/A 09/08/2021   Procedure: ELECTROPHYSIOLOGY STUDY;  Surgeon: Cindie Ole DASEN, MD;  Location: [facility] INVASIVE CV LAB;  Service: Cardiovascular;  Laterality: N/A;   EYE SURGERY     FLEXOR TENDON REPAIR Left 07/11/2019   Procedure: FLEXOR tenolysis  REPAIR LEFT RING FINGER with tednon repair;  Surgeon: Kathlynn Sharper, MD;  Location: [facility] ORS;  Service: Orthopedics;  Laterality: Left;   INCISION AND DRAINAGE ABSCESS Left 05/08/2019   Procedure: INCISION AND DRAINAGE ABSCESS;  Surgeon: Mardee Lynwood SQUIBB, MD;  Location: [facility] ORS;  Service: Orthopedics;  Laterality: Left;   INTRAOPERATIVE TRANSTHORACIC ECHOCARDIOGRAM N/A 07/05/2021    Procedure: INTRAOPERATIVE TRANSTHORACIC ECHOCARDIOGRAM;  Surgeon: Wonda Sharper, MD;  Location: [facility] OR;  Service: Open Heart Surgery;  Laterality: N/A;   LEFT HEART CATH AND CORONARY ANGIOGRAPHY Left 06/09/2022   Procedure: LEFT HEART CATH AND CORONARY ANGIOGRAPHY;  Surgeon: Fernand Denyse LABOR, MD;  Location: [facility] INVASIVE CV LAB;  Service: Cardiovascular;  Laterality: Left;   LEFT HEART CATH AND CORONARY ANGIOGRAPHY Left 09/27/2023   Procedure: LEFT HEART CATH AND CORONARY ANGIOGRAPHY with possible coronary intervention;  Surgeon: Fernand Denyse LABOR, MD;  Location: [facility] INVASIVE CV LAB;  Service: Cardiovascular;  Laterality: Left;   LEFT HEART CATH AND CORS/GRAFTS ANGIOGRAPHY N/A 10/02/2019   Procedure: LEFT HEART CATH AND CORS/GRAFTS ANGIOGRAPHY;  Surgeon: Anner Alm ORN, MD;  Location: [facility] INVASIVE CV LAB;  Service: Cardiovascular;  Laterality: N/A;   LEFT HEART CATH AND CORS/GRAFTS ANGIOGRAPHY N/A 07/17/2022   Procedure: LEFT HEART CATH AND CORS/GRAFTS ANGIOGRAPHY and possible PCI and stent;  Surgeon: Fernand Denyse LABOR, MD;  Location: [facility] INVASIVE CV LAB;  Service: Cardiovascular;  Laterality: N/A;   LEFT HEART CATH AND CORS/GRAFTS ANGIOGRAPHY N/A 05/05/2024   Procedure: LEFT HEART CATH AND CORS/GRAFTS ANGIOGRAPHY;  Surgeon: Florencio Cara BIRCH, MD;  Location: [facility] INVASIVE CV LAB;  Service: Cardiovascular;  Laterality: N/A;   LOOP RECORDER INSERTION N/A 10/06/2019   Procedure: LOOP RECORDER INSERTION;  Surgeon: Waddell Danelle ORN, MD;  Location: [facility] INVASIVE CV LAB;  Service: Cardiovascular;  Laterality: N/A;   TRANSCATHETER AORTIC VALVE REPLACEMENT, TRANSFEMORAL N/A 07/05/2021   Procedure:  TRANSCATHETER AORTIC VALVE REPLACEMENT, TRANSFEMORAL;  Surgeon: Wonda Sharper, MD;  Location: [facility] OR;  Service: Open Heart Surgery;  Laterality: N/A;   TRIGGER FINGER RELEASE Left     Medications Prior to Admission  Medication Sig Dispense Refill Last Dose/Taking   acetaminophen  (TYLENOL ) 650 MG CR tablet Take 1,300 mg  by mouth every 8 (eight) hours as needed for pain.   Unknown   amiodarone  (PACERONE ) 100 MG tablet Take 2 tablets (200 mg total) by mouth daily. (Patient taking differently: Take 200 mg by mouth at bedtime.) 60 tablet 0 06/28/2024   apixaban  (ELIQUIS ) 5 MG TABS tablet Take 1 tablet (5 mg total) by mouth 2 (two) times daily. 180 tablet 1 06/29/2024 Morning   atorvastatin  (LIPITOR ) 80 MG tablet Take 1 tablet (80 mg total) by mouth at bedtime. 30 tablet 0 Taking   Calcium  Citrate-Vitamin D  (CALCIUM  + D PO) Take 1 tablet by mouth 2 (two) times daily.   Unknown   clopidogrel  (PLAVIX ) 75 MG tablet Take 1 tablet by mouth once daily 30 tablet 0 06/29/2024 Morning   cyanocobalamin  (VITAMIN B12) 1000 MCG tablet Take 1 tablet (1,000 mcg total) by mouth daily. 90 tablet 1 Unknown   cyclopentolate  (CYCLODRYL,CYCLOGYL ) 1 % ophthalmic solution Place 1 drop into the left eye 2 (two) times daily.   Unknown   diclofenac  Sodium (VOLTAREN ) 1 % GEL Apply 4 g topically 4 (four) times daily.   Unknown   divalproex  (DEPAKOTE ) 250 MG DR tablet Take 250 mg by mouth at bedtime.   06/28/2024 Bedtime   insulin  degludec (TRESIBA ) 100 UNIT/ML FlexTouch Pen Inject 10 Units into the skin daily.   06/29/2024 Morning   isosorbide  mononitrate (IMDUR ) 30 MG 24 hr tablet Take 0.5 tablets (15 mg total) by mouth daily. 30 tablet 0 Unknown   JARDIANCE  25 MG TABS tablet Take 25 mg by mouth daily.   06/29/2024 Morning   levothyroxine  (SYNTHROID ) 50 MCG tablet Take 50 mcg by mouth daily before breakfast.   06/29/2024 Morning   metoprolol  succinate (TOPROL  XL) 25 MG 24 hr tablet Take 0.5 tablets (12.5 mg total) by mouth daily.   Unknown   Multiple Vitamin (MULTIVITAMIN) capsule Take 1 capsule by mouth daily.   Unknown   mupirocin ointment (BACTROBAN) 2 % Apply 1 Application topically 2 (two) times daily.   Unknown   nitroGLYCERIN  (NITROSTAT ) 0.4 MG SL tablet Place 1 tablet (0.4 mg total) under the tongue every 5 (five) minutes as needed for chest  pain. 30 tablet 2 Unknown   OXERVATE  0.002 % SOLN Place 1 drop into both eyes 6 (six) times daily.   Taking   spironolactone  (ALDACTONE ) 25 MG tablet Take 1 tablet (25 mg total) by mouth daily.   06/29/2024 Morning   torsemide  (DEMADEX ) 20 MG tablet Take 1 tablet (20 mg total) by mouth daily. 30 tablet 0 06/29/2024 Morning   TRULICITY  4.5 MG/0.5ML SOAJ Inject 4.5 mg into the skin once a week.   Past Week   moxifloxacin (VIGAMOX) 0.5 % ophthalmic solution Place 1 drop into the left eye 3 (three) times daily. (Patient not taking: Reported on 06/30/2024)   Not Taking   ofloxacin  (OCUFLOX ) 0.3 % ophthalmic solution Place 1 drop into the left eye 4 (four) times daily. (Patient not taking: Reported on 06/30/2024)   Not Taking   prednisoLONE  acetate (PRED FORTE ) 1 % ophthalmic suspension Place 1 drop into the left eye 4 (four) times daily. (Patient not taking: Reported on 06/30/2024)   Not Taking  Family History  Problem Relation Age of Onset   Seizures Brother       Review of systems complete and found to be negative unless listed above      PHYSICAL EXAM  Alert and oriented x 3 No pedal edema No wheeze Systolic murmur in aortic area  Labs:   Lab Results  Component Value Date   WBC 4.5 07/02/2024   HGB 9.5 (L) 07/02/2024   HCT 31.5 (L) 07/02/2024   MCV 87.0 07/02/2024   PLT 147 (L) 07/02/2024    Recent Labs  Lab 07/04/24 0334  NA 133*  K 4.4  CL 92*  CO2 28  BUN 53*  CREATININE 2.03*  CALCIUM  8.8*  GLUCOSE 207*   Lab Results  Component Value Date   TROPONINI <0.03 08/08/2017    Lab Results  Component Value Date   CHOL 157 05/05/2024   CHOL 132 03/20/2024   CHOL 129 01/17/2024   Lab Results  Component Value Date   HDL 37 (L) 05/05/2024   HDL 37 (L) 03/20/2024   HDL 37 (L) 01/17/2024   Lab Results  Component Value Date   LDLCALC 104 (H) 05/05/2024   LDLCALC 80 03/20/2024   LDLCALC 79 01/17/2024   Lab Results  Component Value Date   TRIG 83 05/05/2024    TRIG 76 03/20/2024   TRIG 65 01/17/2024   Lab Results  Component Value Date   CHOLHDL 4.3 05/05/2024   CHOLHDL 3.6 03/20/2024   CHOLHDL 3.5 01/17/2024   No results found for: LDLDIRECT    Radiology: CT Angio Chest PE W/Cm &/Or Wo Cm Result Date: 06/29/2024 EXAM: CTA of the Chest with contrast for PE 06/29/2024 09:33:08 PM TECHNIQUE: CTA of the chest was performed after the administration of 75 mL of iohexol  (OMNIPAQUE ) 350 MG/ML injection. Multiplanar reformatted images are provided for review. MIP images are provided for review. Automated exposure control, iterative reconstruction, and/or weight based adjustment of the mA/kV was utilized to reduce the radiation dose to as low as reasonably achievable. COMPARISON: Chest radiograph 06/29/2024 and CT chest 01/17/2024. CLINICAL HISTORY: Pulmonary embolism (PE) suspected, high prob. Shortness of breath and chest pain. Cough for 1.5 weeks. FINDINGS: PULMONARY ARTERIES: Pulmonary arteries are adequately opacified for evaluation. No pulmonary embolism. Main pulmonary artery is normal in caliber. MEDIASTINUM: Cardiac enlargement. Aortic valve replacement. Coronary artery bypass. No pericardial effusions. Calcification of the aorta without aneurysm. The thyroid  gland is unremarkable. Esophagus is decompressed. Postoperative changes with median sternotomy. LYMPH NODES: Prominent mediastinal lymph nodes with pretracheal lymph nodes measuring up to about 1.3 cm diameter. Lymphadenopathy is similar to the prior study, most likely reactive. No axillary lymphadenopathy. LUNGS AND PLEURA: Moderate bilateral pleural effusions. Atelectasis or consolidation in the lung bases. Peribronchial thickening with mucous plugging. Patchy ground glass infiltrates throughout the lungs with a mosaic pattern likely representing edema or possibly multifocal pneumonia. No pneumothorax. UPPER ABDOMEN: Intrarenal stone on the left measuring 6 mm in diameter. No hydronephrosis. SOFT  TISSUES AND BONES: Degenerative changes in the spine. No acute bony abnormalities. No acute soft tissue abnormality. IMPRESSION: 1. No evidence of pulmonary embolism. 2. Moderate bilateral pleural effusions. 3. Patchy ground glass infiltrates throughout the lungs with a mosaic pattern, likely representing edema or possibly multifocal pneumonia. 4. Bibasilar atelectasis or consolidation. 5. Peribronchial thickening with mucous plugging, which can be seen with bronchitis. Electronically signed by: Elsie Gravely MD 06/29/2024 09:40 PM EST RP Workstation: [id number]   DG Chest 2 View Result Date: 06/29/2024 EXAM: 2  VIEW(S) XRAY OF THE CHEST 06/29/2024 06:58:00 PM COMPARISON: 05/19/2024 CLINICAL HISTORY: Shortness of breath. FINDINGS: LUNGS AND PLEURA: Mild pulmonary edema. Patchy airspace opacities in lung bases. Small bilateral pleural effusions. No pneumothorax. HEART AND MEDIASTINUM: Cardiomegaly. TAVR stent in place. CABG markers and sternotomy wires noted. Cardiac loop recorder device in place. BONES AND SOFT TISSUES: No acute osseous abnormality. IMPRESSION: 1. Mild pulmonary edema with patchy bibasilar airspace opacities. 2. Small bilateral pleural effusions. 3. Cardiomegaly with TAVR stent, postoperative CABG changes, and cardiac loop recorder. Electronically signed by: Elsie Gravely MD 06/29/2024 07:01 PM EST RP Workstation: [id number]   CUP PACEART REMOTE DEVICE CHECK Result Date: 06/24/2024 ILR summary report received. Battery status OK. Normal device function. No new symptom, tachy, brady, or pause episodes. No new AF episodes. Monthly summary reports and ROV/PRN LA CVRS   EKG: Sinus rhythm on telemetry  ASSESSMENT AND PLAN:  Acute on chronic heart failure with reduced EF CAD with prior CABG Paroxysmal atrial fibrillation Status post TAVR Hypertension Hyperlipidemia AKI on CKD  Will hold p.o. torsemide  as creatinine continues to be elevated.  Likely will hold for a day or 2 and then  start on lower dose. Remains in sinus rhythm.  Continue p.o. amiodarone . Continue Eliquis  for anticoagulation. Continue current GDMT for heart failure. Medication noncompliance likely contributed for heart failure presentation, secondary to vision issues/inability to care for himself.  Currently pending placement Cardiology follow-up in a week at discharge  Signed: Keller JAYSON Paterson MD 07/04/2024, 8:00 AM

## 2024-07-04 NOTE — Telephone Encounter (Signed)
 Await call back

## 2024-07-04 NOTE — Plan of Care (Signed)
   Problem: Education: Goal: Ability to describe self-care measures that may prevent or decrease complications (Diabetes Survival Skills Education) will improve Outcome: Progressing Goal: Individualized Educational Video(s) Outcome: Progressing   Problem: Coping: Goal: Ability to adjust to condition or change in health will improve Outcome: Progressing   Problem: Fluid Volume: Goal: Ability to maintain a balanced intake and output will improve Outcome: Progressing   Problem: Health Behavior/Discharge Planning: Goal: Ability to identify and utilize available resources and services will improve Outcome: Progressing Goal: Ability to manage health-related needs will improve Outcome: Progressing   Problem: Metabolic: Goal: Ability to maintain appropriate glucose levels will improve Outcome: Progressing   Problem: Nutritional: Goal: Maintenance of adequate nutrition will improve Outcome: Progressing Goal: Progress toward achieving an optimal weight will improve Outcome: Progressing   Problem: Skin Integrity: Goal: Risk for impaired skin integrity will decrease Outcome: Progressing   Problem: Tissue Perfusion: Goal: Adequacy of tissue perfusion will improve Outcome: Progressing   Problem: Education: Goal: Knowledge of General Education information will improve Description: Including pain rating scale, medication(s)/side effects and non-pharmacologic comfort measures Outcome: Progressing   Problem: Health Behavior/Discharge Planning: Goal: Ability to manage health-related needs will improve Outcome: Progressing   Problem: Clinical Measurements: Goal: Ability to maintain clinical measurements within normal limits will improve Outcome: Progressing Goal: Will remain free from infection Outcome: Progressing Goal: Diagnostic test results will improve Outcome: Progressing Goal: Respiratory complications will improve Outcome: Progressing Goal: Cardiovascular complication will  be avoided Outcome: Progressing   Problem: Activity: Goal: Risk for activity intolerance will decrease Outcome: Progressing   Problem: Nutrition: Goal: Adequate nutrition will be maintained Outcome: Progressing   Problem: Coping: Goal: Level of anxiety will decrease Outcome: Progressing   Problem: Elimination: Goal: Will not experience complications related to bowel motility Outcome: Progressing Goal: Will not experience complications related to urinary retention Outcome: Progressing   Problem: Pain Managment: Goal: General experience of comfort will improve and/or be controlled Outcome: Progressing   Problem: Safety: Goal: Ability to remain free from injury will improve Outcome: Progressing   Problem: Skin Integrity: Goal: Risk for impaired skin integrity will decrease Outcome: Progressing   Problem: Education: Goal: Ability to demonstrate management of disease process will improve Outcome: Progressing Goal: Ability to verbalize understanding of medication therapies will improve Outcome: Progressing Goal: Individualized Educational Video(s) Outcome: Progressing   Problem: Activity: Goal: Capacity to carry out activities will improve Outcome: Progressing   Problem: Cardiac: Goal: Ability to achieve and maintain adequate cardiopulmonary perfusion will improve Outcome: Progressing   Problem: Activity: Goal: Ability to tolerate increased activity will improve Outcome: Progressing   Problem: Clinical Measurements: Goal: Ability to maintain a body temperature in the normal range will improve Outcome: Progressing   Problem: Respiratory: Goal: Ability to maintain adequate ventilation will improve Outcome: Progressing Goal: Ability to maintain a clear airway will improve Outcome: Progressing

## 2024-07-04 NOTE — Progress Notes (Signed)
 Mobility Specialist - Progress Note   07/04/24 1102  Mobility  Activity Ambulated with assistance  Level of Assistance Standby assist, set-up cues, supervision of patient - no hands on  Assistive Device Front wheel walker  Distance Ambulated (ft) 24 ft  Activity Response Tolerated well  Mobility visit 1 Mobility  Mobility Specialist Start Time (ACUTE ONLY) 1045  Mobility Specialist Stop Time (ACUTE ONLY) 1057  Mobility Specialist Time Calculation (min) (ACUTE ONLY) 12 min   Pt amb to/from the bathroom w/ sup, tolerated well. Pt returned to the bed, left seated EOB with needs within reach. RN notified.  America Silvan Mobility Specialist 07/04/24 11:03 AM

## 2024-07-04 NOTE — TOC Transition Note (Addendum)
 Transition of Care [facility]) - Discharge Note   Patient Details  Name: Thomas Moore MRN: [id number] Date of Birth: [birth year]  Transition of Care [facility]) CM/SW Contact:  Lauraine JAYSON Carpen, LCSW Phone Number: 07/04/2024, 12:11 PM   Clinical Narrative:   Patient has orders to discharge today. [facility] liaison is aware. No further concerns. CSW signing off.  3:59 pm: Jacques does need an FL2 and discharge summary. CSW emailed to him.  Final next level of care: Home w Home Health Services Barriers to Discharge: Barriers Resolved   Patient Goals and CMS Choice Patient states their goals for this hospitalization and ongoing recovery are:: to move to a facility long term          Discharge Placement                Patient to be transferred to facility by: [facility] is arranging transportation   Patient and family notified of of transfer: 07/04/24  Discharge Plan and Services Additional resources added to the After Visit Summary for     Discharge Planning Services: CM Consult                      HH Arranged: RN, PT, OT [facility] Agency: [facility] Date [facility] Agency Contacted: 07/04/24   Representative spoke with at [facility] Agency: [place]  Social Drivers of Health (SDOH) Interventions SDOH Screenings   Food Insecurity: No Food Insecurity (05/22/2024)  Housing: Low Risk (05/22/2024)  Transportation Needs: No Transportation Needs (05/22/2024)  Utilities: Not At Risk (05/22/2024)  Financial Resource Strain: Low Risk  (03/06/2024)   Received from [facility]  Social Connections: Moderately Integrated (05/22/2024)  Tobacco Use: Medium Risk (06/11/2024)   Received from [facility]     Readmission Risk Interventions    07/01/2024   12:05 PM 05/22/2024    1:47 PM 05/06/2024   11:17 AM  Readmission Risk Prevention Plan  Transportation Screening -- Complete Complete  PCP or Specialist Appt within 3-5 Days   Complete   Social Work Consult for Recovery Care Planning/Counseling   Complete  Medication Review Oceanographer) Complete Complete Complete  PCP or Specialist appointment within 3-5 days of discharge Complete Complete   HRI or Home Care Consult Complete Complete   SW Recovery Care/Counseling Consult  Complete   Palliative Care Screening Not Applicable Not Applicable   Skilled Nursing Facility Complete Complete

## 2024-07-04 NOTE — TOC Progression Note (Addendum)
 Transition of Care [facility]) - Progression Note    Patient Details  Name: Thomas Moore MRN: [id number] Date of Birth: 1955-12-13  Transition of Care [facility]) CM/SW Contact  Lauraine JAYSON Carpen, LCSW Phone Number: 07/04/2024, 10:23 AM  Clinical Narrative:   Per [facility] supervisor, patient will go to [facility]. CSW spoke to [place] with [facility]. The facility liaison will be here around 3:00 to complete paperwork with patient. They will plan for transportation to the facility between 4:00-5:00 today. Patient, MD, and RN are aware.  10:51 am: Patient will be going to [address] [place], [place] [address]. [facility] is aware. Asked MD for home health orders for PT, OT, RN. MD is going to have medications filled here so he can take them with him. Sonny confirmed the facility will not need an FL2 or discharge summary sent over.  11:29 am: Jacques from [employer] met with patient. He confirmed he does not need an FL2 or discharge summary. Updated him regarding home health and getting medications from our pharmacy.  Expected Discharge Plan: Home w Home Health Services Barriers to Discharge: Continued Medical Work up               Expected Discharge Plan and Services   Discharge Planning Services: CM Consult   Living arrangements for the past 2 months: Apartment Expected Discharge Date: 07/04/24                           [facility] Agency: [facility]         Social Drivers of Health (SDOH) Interventions SDOH Screenings   Food Insecurity: No Food Insecurity (05/22/2024)  Housing: Low Risk (05/22/2024)  Transportation Needs: No Transportation Needs (05/22/2024)  Utilities: Not At Risk (05/22/2024)  Financial Resource Strain: Low Risk  (03/06/2024)   Received from [facility]  Social Connections: Moderately Integrated (05/22/2024)  Tobacco Use: Medium Risk (06/11/2024)   [facility]    Readmission Risk Interventions    07/01/2024   12:05 PM 05/22/2024    1:47 PM 05/06/2024   11:17 AM  Readmission Risk Prevention Plan  Transportation Screening -- Complete Complete  PCP or Specialist Appt within 3-5 Days   Complete  Social Work Consult for Recovery Care Planning/Counseling   Complete  Medication Review Oceanographer) Complete Complete Complete  PCP or Specialist appointment within 3-5 days of discharge Complete Complete   HRI or Home Care Consult Complete Complete   SW Recovery Care/Counseling Consult  Complete   Palliative Care Screening Not Applicable Not Applicable   Skilled Nursing Facility Complete Complete

## 2024-07-04 NOTE — Inpatient Diabetes Management (Signed)
 Inpatient Diabetes Program Recommendations  AACE/ADA: New Consensus Statement on Inpatient Glycemic Control   Target Ranges:  Prepandial:   less than 140 mg/dL      Peak postprandial:   less than 180 mg/dL (1-2 hours)      Critically ill patients:  140 - 180 mg/dL    Latest Reference Range & Units 07/03/24 08:17 07/03/24 11:46 07/03/24 16:15 07/03/24 21:56  Glucose-Capillary 70 - 99 mg/dL 844 (H) 812 (H) 842 (H) 277 (H)   Review of Glycemic Control  Diabetes history: DM2 Outpatient Diabetes medications: Tresiba  10 units daily, Trulicity  4.5 mg Qweek, Jardiance  25 mg daily Current orders for Inpatient glycemic control: Semglee  10 units daily, Novolog  0-15 units TID with meals, Novolog  0-5 units at bedtime, Jardiance  25 mg daily   Inpatient Diabetes Program Recommendations:     Insulin : May want to consider ordering Novolog  2 units TID with meals for meal coverage if patient eats at least 50% of meals.   Thanks, Earnie Gainer, RN, MSN, CDCES Diabetes Coordinator Inpatient Diabetes Program [phone] (Team Pager from 8am to 5pm)

## 2024-07-04 NOTE — Discharge Summary (Signed)
 Thomas Moore:[id number] DOB: 1955/06/22 DOA: 06/29/2024  PCP: Lenon Layman ORN, MD  Admit date: 06/29/2024 Discharge date: 07/04/2024  Time spent: 35 minutes  Recommendations for Outpatient Follow-up:  Pcp and cardiology f/u 1 week Check kidney function 1 week     Discharge Diagnoses:  Principal Problem:   Acute on chronic combined systolic (congestive) and diastolic (congestive) heart failure (HCC) Active Problems:   Multifocal pneumonia   Hypothyroidism   Paroxysmal atrial fibrillation (HCC)   Dyslipidemia   Type 2 diabetes mellitus without complications (HCC)   Coronary artery disease   Seizure disorder [facility])   Discharge Condition: stable  Diet recommendation: heart healthy  Filed Weights   07/02/24 0500 07/03/24 0500 07/04/24 0500  Weight: 59.8 kg 59.8 kg 57.9 kg    History of present illness:  From admission Thomas Moore is a 70 y.o. male with medical history significant for coronary artery disease status post four-vessel CABG, CHF, COPD, CVA, type 2 diabetes mellitus, GERD, hypertension and dyslipidemia, seizure disorder and status post TAVR, who presented to the emergency room with acute onset of worsening dyspnea over the last couple of weeks that deteriorated over the last couple of days.  He admitted to worsening lower extremity edema as well as dyspnea on exertion and orthopnea.  No cough or wheezing or dyspnea...  No nausea or vomiting or abdominal pain.  No dysuria, oliguria or hematuria or flank pain.  The patient lives alone and has been having difficulty getting his medications especially given his cataract.    Hospital Course:   Acute hypoxic respiratory failure 2/2 chf, now weaned off o2, breathing comfortably   Acute exacerbation of HFrEF - Worsening orthopnea, DOE, dyspnea prior to admission.  proBNP greater than 20,000.  Chest x-ray personally reviewed noting congestion.  Responded well to iv lasix , now transitioned to orals. Continue  GDMT with Jardiance , metoprolol , spironolactone , torsemide . Creatinine rising on torsemide  20, cardiology advises holding for 2 days, resuming at lower dose of 10 daily on 2/1. Will need check of bmp at pcp or cardiology f/u next week   Concern for multifocal pneumonia - CT imaging showing patchy opacities, edema versus pneumonia.  Patient not complaining of purulent sputum, fever.  No leukocytosis.  Was given empiric antibiotics.  Abx discontinued. Stable off those, no signs infection   Acute kidney injury on ckd 3b Resolved kidney function now at baseline of around 1.9 though with slight up-trend with diuresis (see above), will need close outpt monitoring   Paroxysmal atrial fibrillation Hx PE - Eliquis  and amiodarone  on board.      Coronary artery disease - Continue Plavix , statin. Cardiology wants both apixaban  and plavix    Diabetes mellitus - Insulin  sliding scale on board.  Insulin  glargine 10 units daily.   Hypothyroidism - Synthroid  on board.   Seizure disorder - Depakote .   Physical debilitation muscle weakness - difficulty caring for self at home and has no supports, just an ex wife. Doesn't qualify for snf and regardless that would only be a short-term help. Social work has located a group home, patient will discharge there.  Procedures: none   Consultations: cardiology  Discharge Exam: Vitals:   07/04/24 0411 07/04/24 0814  BP: 116/61 (!) 113/56  Pulse: 65 68  Resp: 20   Temp: 98.4 F (36.9 C) 97.8 F (36.6 C)  SpO2: 96% 94%    General: NAD Cardiovascular: RRR Respiratory: rr, soft systolic murmur Ext: warm, no edema  Discharge Instructions  Discharge Instructions     Increase activity slowly   Complete by: As directed    No wound care   Complete by: As directed       Allergies as of 07/04/2024   No Known Allergies      Medication List     STOP taking these medications    moxifloxacin 0.5 % ophthalmic solution Commonly known as:  VIGAMOX   ofloxacin  0.3 % ophthalmic solution Commonly known as: OCUFLOX    prednisoLONE  acetate 1 % ophthalmic suspension Commonly known as: PRED FORTE        TAKE these medications    acetaminophen  650 MG CR tablet Commonly known as: TYLENOL  Take 1,300 mg by mouth every 8 (eight) hours as needed for pain.   amiodarone  100 MG tablet Commonly known as: Pacerone  Take 2 tablets (200 mg total) by mouth at bedtime.   apixaban  2.5 MG Tabs tablet Commonly known as: ELIQUIS  Take 1 tablet (2.5 mg total) by mouth 2 (two) times daily. What changed:  medication strength how much to take   atorvastatin  80 MG tablet Commonly known as: LIPITOR  Take 1 tablet (80 mg total) by mouth at bedtime.   CALCIUM  + D PO Take 1 tablet by mouth 2 (two) times daily.   clopidogrel  75 MG tablet Commonly known as: PLAVIX  Take 1 tablet (75 mg total) by mouth daily.   cyanocobalamin  1000 MCG tablet Commonly known as: VITAMIN B12 Take 1 tablet (1,000 mcg total) by mouth daily.   cyclopentolate  1 % ophthalmic solution Commonly known as: CYCLODRYL,CYCLOGYL  Place 1 drop into the left eye 2 (two) times daily.   diclofenac  Sodium 1 % Gel Commonly known as: VOLTAREN  Apply 4 g topically 4 (four) times daily.   divalproex  250 MG DR tablet Commonly known as: DEPAKOTE  Take 1 tablet (250 mg total) by mouth at bedtime.   insulin  degludec 100 UNIT/ML FlexTouch Pen Commonly known as: TRESIBA  Inject 10 Units into the skin daily.   isosorbide  mononitrate 30 MG 24 hr tablet Commonly known as: IMDUR  Take 0.5 tablets (15 mg total) by mouth daily. Start taking on: July 05, 2024   Jardiance  25 MG Tabs tablet Generic drug: empagliflozin  Take 1 tablet (25 mg total) by mouth daily.   levothyroxine  50 MCG tablet Commonly known as: SYNTHROID  Take 1 tablet (50 mcg total) by mouth daily before breakfast.   metoprolol  succinate 25 MG 24 hr tablet Commonly known as: Toprol  XL Take 0.5 tablets (12.5 mg  total) by mouth daily. Start taking on: July 05, 2024   multivitamin capsule Take 1 capsule by mouth daily.   mupirocin ointment 2 % Commonly known as: BACTROBAN Apply 1 Application topically 2 (two) times daily.   nitroGLYCERIN  0.4 MG SL tablet Commonly known as: NITROSTAT  Place 1 tablet (0.4 mg total) under the tongue every 5 (five) minutes as needed for chest pain.   Oxervate  0.002 % Soln Generic drug: Cenegermin -bkbj Place 1 drop into both eyes 6 (six) times daily.   spironolactone  25 MG tablet Commonly known as: ALDACTONE  Take 1 tablet (25 mg total) by mouth daily.   torsemide  10 MG tablet Commonly known as: DEMADEX  Take 2 tablets (20 mg total) by mouth daily. Begin taking 2/1 What changed:  medication strength additional instructions   Trulicity  4.5 MG/0.5ML Soaj Generic drug: Dulaglutide  Inject 4.5 mg into the skin once a week.       Allergies[1]  Follow-up Information     Fernand Denyse LABOR, MD. Go in 1 week(s).   Specialty:  Cardiology Contact information: [address] [place] [address] [phone]         Lenon Layman ORN, MD Follow up.   Specialty: Internal Medicine Why: one week Contact information: [address] [facility] [place] [address] [phone]                  The results of significant diagnostics from this hospitalization (including imaging, microbiology, ancillary and laboratory) are listed below for reference.    Significant Diagnostic Studies: CT Angio Chest PE W/Cm &/Or Wo Cm Result Date: 06/29/2024 EXAM: CTA of the Chest with contrast for PE 06/29/2024 09:33:08 PM TECHNIQUE: CTA of the chest was performed after the administration of 75 mL of iohexol  (OMNIPAQUE ) 350 MG/ML injection. Multiplanar reformatted images are provided for review. MIP images are provided for review. Automated exposure control, iterative reconstruction, and/or weight based adjustment of the mA/kV was utilized  to reduce the radiation dose to as low as reasonably achievable. COMPARISON: Chest radiograph 06/29/2024 and CT chest 01/17/2024. CLINICAL HISTORY: Pulmonary embolism (PE) suspected, high prob. Shortness of breath and chest pain. Cough for 1.5 weeks. FINDINGS: PULMONARY ARTERIES: Pulmonary arteries are adequately opacified for evaluation. No pulmonary embolism. Main pulmonary artery is normal in caliber. MEDIASTINUM: Cardiac enlargement. Aortic valve replacement. Coronary artery bypass. No pericardial effusions. Calcification of the aorta without aneurysm. The thyroid  gland is unremarkable. Esophagus is decompressed. Postoperative changes with median sternotomy. LYMPH NODES: Prominent mediastinal lymph nodes with pretracheal lymph nodes measuring up to about 1.3 cm diameter. Lymphadenopathy is similar to the prior study, most likely reactive. No axillary lymphadenopathy. LUNGS AND PLEURA: Moderate bilateral pleural effusions. Atelectasis or consolidation in the lung bases. Peribronchial thickening with mucous plugging. Patchy ground glass infiltrates throughout the lungs with a mosaic pattern likely representing edema or possibly multifocal pneumonia. No pneumothorax. UPPER ABDOMEN: Intrarenal stone on the left measuring 6 mm in diameter. No hydronephrosis. SOFT TISSUES AND BONES: Degenerative changes in the spine. No acute bony abnormalities. No acute soft tissue abnormality. IMPRESSION: 1. No evidence of pulmonary embolism. 2. Moderate bilateral pleural effusions. 3. Patchy ground glass infiltrates throughout the lungs with a mosaic pattern, likely representing edema or possibly multifocal pneumonia. 4. Bibasilar atelectasis or consolidation. 5. Peribronchial thickening with mucous plugging, which can be seen with bronchitis. Electronically signed by: Elsie Gravely MD 06/29/2024 09:40 PM EST RP Workstation: [id number]   DG Chest 2 View Result Date: 06/29/2024 EXAM: 2 VIEW(S) XRAY OF THE CHEST 06/29/2024  06:58:00 PM COMPARISON: 05/19/2024 CLINICAL HISTORY: Shortness of breath. FINDINGS: LUNGS AND PLEURA: Mild pulmonary edema. Patchy airspace opacities in lung bases. Small bilateral pleural effusions. No pneumothorax. HEART AND MEDIASTINUM: Cardiomegaly. TAVR stent in place. CABG markers and sternotomy wires noted. Cardiac loop recorder device in place. BONES AND SOFT TISSUES: No acute osseous abnormality. IMPRESSION: 1. Mild pulmonary edema with patchy bibasilar airspace opacities. 2. Small bilateral pleural effusions. 3. Cardiomegaly with TAVR stent, postoperative CABG changes, and cardiac loop recorder. Electronically signed by: Elsie Gravely MD 06/29/2024 07:01 PM EST RP Workstation: [id number]   CUP PACEART REMOTE DEVICE CHECK Result Date: 06/24/2024 ILR summary report received. Battery status OK. Normal device function. No new symptom, tachy, brady, or pause episodes. No new AF episodes. Monthly summary reports and ROV/PRN LA CVRS   Microbiology: Recent Results (from the past 240 hours)  Resp panel by RT-PCR (RSV, Flu A&B, Covid) Anterior Nasal Swab     Status: None   Collection Time: 06/29/24  7:34 PM  Specimen: Anterior Nasal Swab  Result Value Ref Range Status   SARS Coronavirus 2 by RT PCR NEGATIVE NEGATIVE Final    Comment: (NOTE) SARS-CoV-2 target nucleic acids are NOT DETECTED.  The SARS-CoV-2 RNA is generally detectable in upper respiratory specimens during the acute phase of infection. The lowest concentration of SARS-CoV-2 viral copies this assay can detect is 138 copies/mL. A negative result does not preclude SARS-Cov-2 infection and should not be used as the sole basis for treatment or other patient management decisions. A negative result may occur with  improper specimen collection/handling, submission of specimen other than nasopharyngeal swab, presence of viral mutation(s) within the areas targeted by this assay, and inadequate number of viral copies(<138 copies/mL). A  negative result must be combined with clinical observations, patient history, and epidemiological information. The expected result is Negative.  Fact Sheet for Patients:  bloggercourse.com  Fact Sheet for Healthcare Providers:  seriousbroker.it  This test is no t yet approved or cleared by the [place]  FDA and  has been authorized for detection and/or diagnosis of SARS-CoV-2 by FDA under an Emergency Use Authorization (EUA). This EUA will remain  in effect (meaning this test can be used) for the duration of the COVID-19 declaration under Section 564(b)(1) of the Act, 21 U.S.C.section 360bbb-3(b)(1), unless the authorization is terminated  or revoked sooner.       Influenza A by PCR NEGATIVE NEGATIVE Final   Influenza B by PCR NEGATIVE NEGATIVE Final    Comment: (NOTE) The Xpert Xpress SARS-CoV-2/FLU/RSV plus assay is intended as an aid in the diagnosis of influenza from Nasopharyngeal swab specimens and should not be used as a sole basis for treatment. Nasal washings and aspirates are unacceptable for Xpert Xpress SARS-CoV-2/FLU/RSV testing.  Fact Sheet for Patients: bloggercourse.com  Fact Sheet for Healthcare Providers: seriousbroker.it  This test is not yet approved or cleared by the [place]  FDA and has been authorized for detection and/or diagnosis of SARS-CoV-2 by FDA under an Emergency Use Authorization (EUA). This EUA will remain in effect (meaning this test can be used) for the duration of the COVID-19 declaration under Section 564(b)(1) of the Act, 21 U.S.C. section 360bbb-3(b)(1), unless the authorization is terminated or revoked.     Resp Syncytial Virus by PCR NEGATIVE NEGATIVE Final    Comment: (NOTE) Fact Sheet for Patients: bloggercourse.com  Fact Sheet for Healthcare  Providers: seriousbroker.it  This test is not yet approved or cleared by the [place]  FDA and has been authorized for detection and/or diagnosis of SARS-CoV-2 by FDA under an Emergency Use Authorization (EUA). This EUA will remain in effect (meaning this test can be used) for the duration of the COVID-19 declaration under Section 564(b)(1) of the Act, 21 U.S.C. section 360bbb-3(b)(1), unless the authorization is terminated or revoked.  Performed at [facility], [address] [place], [place] [address]      Labs: Basic Metabolic Panel: Recent Labs  Lab 06/30/24 0330 07/01/24 0300 07/02/24 0517 07/03/24 0819 07/04/24 0334  NA 138 142 138 135 133*  K 3.7 4.4 3.8 4.8 4.4  CL 103 102 96* 95* 92*  CO2 22 30 30 27 28   GLUCOSE 144* 111* 143* 156* 207*  BUN 29* 32* 36* 42* 53*  CREATININE 1.48* 1.82* 1.90* 1.95* 2.03*  CALCIUM  8.3* 9.1 8.9 9.3 8.8*  MG  --  2.1 2.2  --  2.3   Liver Function Tests: No results for input(s): AST, ALT, ALKPHOS, BILITOT, PROT, ALBUMIN in the last 168  hours. No results for input(s): LIPASE, AMYLASE in the last 168 hours. No results for input(s): AMMONIA in the last 168 hours. CBC: Recent Labs  Lab 06/29/24 1934 06/30/24 0330 07/01/24 0300 07/02/24 0517  WBC 7.3 5.5 4.3 4.5  NEUTROABS 6.1  --   --   --   HGB 9.4* 8.7* 9.6* 9.5*  HCT 31.8* 29.2* 32.5* 31.5*  MCV 88.8 88.8 88.3 87.0  PLT 154 109* 134* 147*   Cardiac Enzymes: No results for input(s): CKTOTAL, CKMB, CKMBINDEX, TROPONINI in the last 168 hours. BNP: BNP (last 3 results) Recent Labs    09/17/23 1350 01/16/24 0943 03/19/24 1146  BNP 814.6* 798.4* 1,635.7*    ProBNP (last 3 results) Recent Labs    05/19/24 1601 06/29/24 1934  PROBNP 27,740.0* 20,181.0*    CBG: Recent Labs  Lab 07/03/24 0817 07/03/24 1146 07/03/24 1615 07/03/24 2156 07/04/24 0812  GLUCAP 155* 187* 157* 277* 148*        Signed:  Devaughn KATHEE Ban MD.  [facility] 07/04/2024, 10:19 AM     [1] No Known Allergies
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
## 2024-07-10 ENCOUNTER — Encounter: Payer: Self-pay | Admitting: Cardiovascular Disease

## 2024-07-10 ENCOUNTER — Ambulatory Visit: Admitting: Cardiovascular Disease

## 2024-07-10 VITALS — BP 122/68 | HR 67 | Ht 63.0 in | Wt 136.0 lb

## 2024-07-10 DIAGNOSIS — I2510 Atherosclerotic heart disease of native coronary artery without angina pectoris: Secondary | ICD-10-CM

## 2024-07-10 DIAGNOSIS — I6523 Occlusion and stenosis of bilateral carotid arteries: Secondary | ICD-10-CM | POA: Diagnosis not present

## 2024-07-10 DIAGNOSIS — R55 Syncope and collapse: Secondary | ICD-10-CM | POA: Diagnosis not present

## 2024-07-10 DIAGNOSIS — I214 Non-ST elevation (NSTEMI) myocardial infarction: Secondary | ICD-10-CM

## 2024-07-10 DIAGNOSIS — I480 Paroxysmal atrial fibrillation: Secondary | ICD-10-CM

## 2024-07-10 DIAGNOSIS — R42 Dizziness and giddiness: Secondary | ICD-10-CM | POA: Diagnosis not present

## 2024-07-10 DIAGNOSIS — I252 Old myocardial infarction: Secondary | ICD-10-CM | POA: Diagnosis not present

## 2024-07-10 DIAGNOSIS — I5042 Chronic combined systolic (congestive) and diastolic (congestive) heart failure: Secondary | ICD-10-CM

## 2024-07-10 DIAGNOSIS — R001 Bradycardia, unspecified: Secondary | ICD-10-CM

## 2024-07-10 DIAGNOSIS — R0602 Shortness of breath: Secondary | ICD-10-CM | POA: Diagnosis not present

## 2024-07-10 DIAGNOSIS — R0789 Other chest pain: Secondary | ICD-10-CM

## 2024-07-10 DIAGNOSIS — N179 Acute kidney failure, unspecified: Secondary | ICD-10-CM | POA: Diagnosis not present

## 2024-07-10 NOTE — Progress Notes (Signed)
 "     Cardiology Office Note   Date:  07/10/2024   ID:  Thomas Moore, DOB 11/22/1955, MRN [id number]  PCP:  Lenon Layman ORN, MD  Cardiologist:  Denyse Bathe, MD      History of Present Illness: Thomas Moore is a 69 y.o. male who presents for  Chief Complaint  Patient presents with   Follow-up    Follow up from the hospital  Went to the hospital for pain in the left lower side that would radiate  Pt stated that they ran blood work and EKG but stated they didn't find anything. Pt caregiver stated he needs a note that shows he can't live on his own anymore     Doing well, no chest pain      Past Medical History:  Diagnosis Date   CAD (coronary artery disease) of artery bypass graft    s/p CABG x 4 in 1997   CHF (congestive heart failure) (HCC)    COPD (chronic obstructive pulmonary disease) (HCC)    Coronary artery disease    CVA (cerebral vascular accident) (HCC)    Diabetes mellitus without complication (HCC)    Dysrhythmia    GERD (gastroesophageal reflux disease)    HLD (hyperlipidemia)    Hypertension    S/P TAVR (transcatheter aortic valve replacement) 07/05/2021   with Edwards 26mm S3UR via TF approach with Dr. Wonda and Dr. Lucas   Seizures [facility])      Past Surgical History:  Procedure Laterality Date   BYPASS GRAFT ANGIOGRAPHY N/A 04/25/2021   Procedure: BYPASS GRAFT ANGIOGRAPHY;  Surgeon: Lawyer Bernardino Cough, MD;  Location: [facility] INVASIVE CV LAB;  Service: Cardiovascular;  Laterality: N/A;   CARDIAC SURGERY     CORNEAL TRANSPLANT Right    CORONARY ARTERY BYPASS GRAFT  1997   ELECTROPHYSIOLOGY STUDY N/A 09/08/2021   Procedure: ELECTROPHYSIOLOGY STUDY;  Surgeon: Cindie Ole DASEN, MD;  Location: [facility] INVASIVE CV LAB;  Service: Cardiovascular;  Laterality: N/A;   EYE SURGERY     FLEXOR TENDON REPAIR Left 07/11/2019   Procedure: FLEXOR tenolysis  REPAIR LEFT RING FINGER with tednon repair;  Surgeon: Kathlynn Sharper, MD;  Location: [facility] ORS;  Service:  Orthopedics;  Laterality: Left;   INCISION AND DRAINAGE ABSCESS Left 05/08/2019   Procedure: INCISION AND DRAINAGE ABSCESS;  Surgeon: Mardee Lynwood SQUIBB, MD;  Location: [facility] ORS;  Service: Orthopedics;  Laterality: Left;   INTRAOPERATIVE TRANSTHORACIC ECHOCARDIOGRAM N/A 07/05/2021   Procedure: INTRAOPERATIVE TRANSTHORACIC ECHOCARDIOGRAM;  Surgeon: Wonda Sharper, MD;  Location: [facility] OR;  Service: Open Heart Surgery;  Laterality: N/A;   LEFT HEART CATH AND CORONARY ANGIOGRAPHY Left 06/09/2022   Procedure: LEFT HEART CATH AND CORONARY ANGIOGRAPHY;  Surgeon: Bathe Denyse LABOR, MD;  Location: [facility] INVASIVE CV LAB;  Service: Cardiovascular;  Laterality: Left;   LEFT HEART CATH AND CORONARY ANGIOGRAPHY Left 09/27/2023   Procedure: LEFT HEART CATH AND CORONARY ANGIOGRAPHY with possible coronary intervention;  Surgeon: Bathe Denyse LABOR, MD;  Location: [facility] INVASIVE CV LAB;  Service: Cardiovascular;  Laterality: Left;   LEFT HEART CATH AND CORS/GRAFTS ANGIOGRAPHY N/A 10/02/2019   Procedure: LEFT HEART CATH AND CORS/GRAFTS ANGIOGRAPHY;  Surgeon: Anner Alm ORN, MD;  Location: [facility] INVASIVE CV LAB;  Service: Cardiovascular;  Laterality: N/A;   LEFT HEART CATH AND CORS/GRAFTS ANGIOGRAPHY N/A 07/17/2022   Procedure: LEFT HEART CATH AND CORS/GRAFTS ANGIOGRAPHY and possible PCI and stent;  Surgeon: Bathe Denyse LABOR, MD;  Location: [facility] INVASIVE CV LAB;  Service: Cardiovascular;  Laterality: N/A;  LEFT HEART CATH AND CORS/GRAFTS ANGIOGRAPHY N/A 05/05/2024   Procedure: LEFT HEART CATH AND CORS/GRAFTS ANGIOGRAPHY;  Surgeon: Florencio Cara BIRCH, MD;  Location: [facility] INVASIVE CV LAB;  Service: Cardiovascular;  Laterality: N/A;   LOOP RECORDER INSERTION N/A 10/06/2019   Procedure: LOOP RECORDER INSERTION;  Surgeon: Waddell Danelle ORN, MD;  Location: [facility] INVASIVE CV LAB;  Service: Cardiovascular;  Laterality: N/A;   TRANSCATHETER AORTIC VALVE REPLACEMENT, TRANSFEMORAL N/A 07/05/2021   Procedure: TRANSCATHETER AORTIC VALVE REPLACEMENT,  TRANSFEMORAL;  Surgeon: Wonda Sharper, MD;  Location: [facility] OR;  Service: Open Heart Surgery;  Laterality: N/A;   TRIGGER FINGER RELEASE Left      Current Outpatient Medications  Medication Sig Dispense Refill   nitroGLYCERIN  (NITROSTAT ) 0.4 MG SL tablet Place 1 tablet (0.4 mg total) under the tongue every 5 (five) minutes as needed for chest pain. 30 tablet 2   acetaminophen  (TYLENOL ) 650 MG CR tablet Take 1,300 mg by mouth every 8 (eight) hours as needed for pain.     amiodarone  (PACERONE ) 100 MG tablet Take 2 tablets (200 mg total) by mouth at bedtime. 90 tablet 1   apixaban  (ELIQUIS ) 2.5 MG TABS tablet Take 1 tablet (2.5 mg total) by mouth 2 (two) times daily. 180 tablet 1   atorvastatin  (LIPITOR ) 80 MG tablet Take 1 tablet (80 mg total) by mouth at bedtime. 90 tablet 1   Calcium  Citrate-Vitamin D  (CALCIUM  + D PO) Take 1 tablet by mouth 2 (two) times daily.     clopidogrel  (PLAVIX ) 75 MG tablet Take 1 tablet (75 mg total) by mouth daily. 90 tablet 1   cyanocobalamin  (VITAMIN B12) 1000 MCG tablet Take 1 tablet (1,000 mcg total) by mouth daily. 90 tablet 1   cyclopentolate  (CYCLODRYL,CYCLOGYL ) 1 % ophthalmic solution Place 1 drop into the left eye 2 (two) times daily.     diclofenac  Sodium (VOLTAREN ) 1 % GEL Apply 4 g topically 4 (four) times daily.     divalproex  (DEPAKOTE ) 250 MG DR tablet Take 1 tablet (250 mg total) by mouth at bedtime. 90 tablet 1   insulin  degludec (TRESIBA ) 100 UNIT/ML FlexTouch Pen Inject 10 Units into the skin daily. 9 mL 1   Insulin  Pen Needle (PEN NEEDLES) 32G X 4 MM MISC 1 each by Does not apply route at bedtime. 100 each 1   isosorbide  mononitrate (IMDUR ) 30 MG 24 hr tablet Take 0.5 tablets (15 mg total) by mouth daily. 90 tablet 1   JARDIANCE  25 MG TABS tablet Take 1 tablet (25 mg total) by mouth daily. 90 tablet 1   levothyroxine  (SYNTHROID ) 50 MCG tablet Take 1 tablet (50 mcg total) by mouth daily before breakfast. 90 tablet 1   metoprolol  succinate (TOPROL   XL) 25 MG 24 hr tablet Take 0.5 tablets (12.5 mg total) by mouth daily. 45 tablet 1   Multiple Vitamin (MULTIVITAMIN) capsule Take 1 capsule by mouth daily.     mupirocin ointment (BACTROBAN) 2 % Apply 1 Application topically 2 (two) times daily.     OXERVATE  0.002 % SOLN Place 1 drop into both eyes 6 (six) times daily. 1 mL 1   spironolactone  (ALDACTONE ) 25 MG tablet Take 1 tablet (25 mg total) by mouth daily. 90 tablet 1   torsemide  (DEMADEX ) 10 MG tablet Take 2 tablets (20 mg total) by mouth daily. Begin taking 2/1 90 tablet 0   TRULICITY  4.5 MG/0.5ML SOAJ Inject 4.5 mg into the skin once a week.     No current facility-administered medications for this visit.  Facility-Administered Medications Ordered in Other Visits  Medication Dose Route Frequency Provider Last Rate Last Admin   sodium chloride  flush (NS) 0.9 % injection 3 mL  3 mL Intravenous Q12H Fernand Denyse LABOR, MD        Allergies:   Patient has no known allergies.    Social History:   reports that he quit smoking about 42 years ago. His smoking use included cigarettes. He has never used smokeless tobacco. He reports that he does not currently use alcohol. He reports that he does not use drugs.   Family History:  family history includes Seizures in his brother.    ROS:     Review of Systems  Constitutional: Negative.   HENT: Negative.    Eyes: Negative.   Respiratory: Negative.    Gastrointestinal: Negative.   Genitourinary: Negative.   Musculoskeletal: Negative.   Skin: Negative.   Neurological: Negative.   Endo/Heme/Allergies: Negative.   Psychiatric/Behavioral: Negative.    All other systems reviewed and are negative.     All other systems are reviewed and negative.    PHYSICAL EXAM: VS:  BP 122/68   Pulse 67   Ht 5' 3 (1.6 m)   Wt 136 lb (61.7 kg)   SpO2 95%   BMI 24.09 kg/m  , BMI Body mass index is 24.09 kg/m. Last weight:  Wt Readings from Last 3 Encounters:  07/10/24 136 lb (61.7 kg)   07/04/24 127 lb 10.3 oz (57.9 kg)  05/24/24 131 lb 2.8 oz (59.5 kg)     Physical Exam Vitals reviewed.  Constitutional:      Appearance: Normal appearance. He is normal weight.  HENT:     Head: Normocephalic.     Nose: Nose normal.     Mouth/Throat:     Mouth: Mucous membranes are moist.  Eyes:     Pupils: Pupils are equal, round, and reactive to light.  Cardiovascular:     Rate and Rhythm: Normal rate and regular rhythm.     Pulses: Normal pulses.     Heart sounds: Normal heart sounds.  Pulmonary:     Effort: Pulmonary effort is normal.  Abdominal:     General: Abdomen is flat. Bowel sounds are normal.  Musculoskeletal:        General: Normal range of motion.     Cervical back: Normal range of motion.  Skin:    General: Skin is warm.  Neurological:     General: No focal deficit present.     Mental Status: He is alert.  Psychiatric:        Mood and Affect: Mood normal.       EKG:   Recent Labs: 03/19/2024: B Natriuretic Peptide 1,635.7 05/04/2024: ALT 30 06/29/2024: Pro Brain Natriuretic Peptide 20,181.0 07/02/2024: Hemoglobin 9.5; Platelets 147 07/04/2024: BUN 53; Creatinine, Ser 2.03; Magnesium  2.3; Potassium 4.4; Sodium 133    Lipid Panel    Component Value Date/Time   CHOL 157 05/05/2024 0353   CHOL 143 02/25/2020 1126   TRIG 83 05/05/2024 0353   HDL 37 (L) 05/05/2024 0353   HDL 31 (L) 02/25/2020 1126   CHOLHDL 4.3 05/05/2024 0353   VLDL 17 05/05/2024 0353   LDLCALC 104 (H) 05/05/2024 0353   LDLCALC 78 02/25/2020 1126      Other studies Reviewed: Additional studies/ records that were reviewed today include:  Review of the above records demonstrates:       No data to display  ASSESSMENT AND PLAN:    ICD-10-CM   1. SOB (shortness of breath)  R06.02    feeling better on GDMT for CHF    2. Paroxysmal atrial fibrillation (HCC)  I48.0     3. AKI (acute kidney injury)  N17.9     4. Syncope, unspecified syncope type  R55      5. Coronary artery disease with hx of myocardial infarct w/o hx of CABG  I25.10    I25.2     6. Other chest pain  R07.89     7. Non-ST elevation (NSTEMI) myocardial infarction (HCC)  I21.4     8. Chronic combined systolic and diastolic CHF (congestive heart failure) (HCC)  I50.42     9. Bilateral carotid artery stenosis  I65.23     10. Dizziness  R42     11. Sinus bradycardia  R00.1        Problem List Items Addressed This Visit       Cardiovascular and Mediastinum   Bilateral carotid artery disease   Non-ST elevation (NSTEMI) myocardial infarction [facility])   Coronary artery disease with hx of myocardial infarct w/o hx of CABG   Paroxysmal atrial fibrillation (HCC)   Chronic combined systolic and diastolic CHF (congestive heart failure) (HCC)     Genitourinary   AKI (acute kidney injury)     Other   Chest pain   Dizziness   SOB (shortness of breath) - Primary   Syncope   Other Visit Diagnoses       Sinus bradycardia              Disposition:   Return in about 3 months (around 10/07/2024).    Total time spent: 35 minutes  Signed,  Denyse Bathe, MD  07/10/2024 2:26 PM    [facility] "

## 2024-07-22 ENCOUNTER — Ambulatory Visit

## 2024-08-22 ENCOUNTER — Ambulatory Visit

## 2024-09-22 ENCOUNTER — Ambulatory Visit

## 2024-10-09 ENCOUNTER — Ambulatory Visit: Admitting: Cardiovascular Disease
